# Patient Record
Sex: Male | Born: 1945 | Race: White | Hispanic: No | Marital: Married | Smoking: Never smoker
Health system: Southern US, Community
[De-identification: ages and names within clinical notes are randomized; demographics above are authoritative.]

## PROBLEM LIST (undated history)

## (undated) DIAGNOSIS — I513 Intracardiac thrombosis, not elsewhere classified: Secondary | ICD-10-CM

## (undated) DIAGNOSIS — Z950 Presence of cardiac pacemaker: Secondary | ICD-10-CM

## (undated) DIAGNOSIS — C801 Malignant (primary) neoplasm, unspecified: Secondary | ICD-10-CM

## (undated) DIAGNOSIS — H409 Unspecified glaucoma: Secondary | ICD-10-CM

## (undated) DIAGNOSIS — G459 Transient cerebral ischemic attack, unspecified: Secondary | ICD-10-CM

## (undated) DIAGNOSIS — F319 Bipolar disorder, unspecified: Secondary | ICD-10-CM

## (undated) DIAGNOSIS — C4A9 Merkel cell carcinoma, unspecified: Secondary | ICD-10-CM

## (undated) DIAGNOSIS — I10 Essential (primary) hypertension: Secondary | ICD-10-CM

## (undated) DIAGNOSIS — G4730 Sleep apnea, unspecified: Secondary | ICD-10-CM

## (undated) HISTORY — PX: SKIN CANCER EXCISION: SHX779

## (undated) HISTORY — PX: PROSTATE SURGERY: SHX751

## (undated) HISTORY — DX: Bipolar disorder, unspecified: F31.9

## (undated) HISTORY — PX: CHOLECYSTECTOMY: SHX55

## (undated) HISTORY — DX: Malignant (primary) neoplasm, unspecified: C80.1

## (undated) HISTORY — DX: Unspecified glaucoma: H40.9

## (undated) HISTORY — PX: PROSTATECTOMY: SHX69

## (undated) HISTORY — DX: Essential (primary) hypertension: I10

## (undated) HISTORY — PX: OTHER SURGICAL HISTORY: SHX169

## (undated) HISTORY — PX: LEG SURGERY: SHX1003

---

## 2004-05-04 ENCOUNTER — Emergency Department: Payer: Self-pay | Admitting: Emergency Medicine

## 2004-05-10 ENCOUNTER — Ambulatory Visit: Payer: Self-pay | Admitting: Unknown Physician Specialty

## 2004-07-11 ENCOUNTER — Ambulatory Visit: Payer: Self-pay | Admitting: Unknown Physician Specialty

## 2004-08-08 ENCOUNTER — Ambulatory Visit: Payer: Self-pay | Admitting: Internal Medicine

## 2004-10-28 ENCOUNTER — Ambulatory Visit: Payer: Self-pay | Admitting: Unknown Physician Specialty

## 2004-11-11 ENCOUNTER — Ambulatory Visit: Payer: Self-pay | Admitting: Dermatology

## 2004-11-16 ENCOUNTER — Ambulatory Visit: Payer: Self-pay | Admitting: Dermatology

## 2005-04-27 ENCOUNTER — Ambulatory Visit: Payer: Self-pay | Admitting: Unknown Physician Specialty

## 2005-08-24 ENCOUNTER — Ambulatory Visit: Payer: Self-pay | Admitting: Radiation Oncology

## 2005-08-29 ENCOUNTER — Ambulatory Visit: Payer: Self-pay | Admitting: Radiation Oncology

## 2005-09-18 ENCOUNTER — Ambulatory Visit: Payer: Self-pay | Admitting: Unknown Physician Specialty

## 2005-09-29 ENCOUNTER — Ambulatory Visit: Payer: Self-pay | Admitting: Radiation Oncology

## 2005-10-29 ENCOUNTER — Ambulatory Visit: Payer: Self-pay | Admitting: Radiation Oncology

## 2005-11-25 ENCOUNTER — Inpatient Hospital Stay: Payer: Self-pay | Admitting: Unknown Physician Specialty

## 2005-11-25 ENCOUNTER — Other Ambulatory Visit: Payer: Self-pay

## 2005-11-26 ENCOUNTER — Other Ambulatory Visit: Payer: Self-pay

## 2005-11-29 ENCOUNTER — Ambulatory Visit: Payer: Self-pay | Admitting: Radiation Oncology

## 2005-12-30 ENCOUNTER — Ambulatory Visit: Payer: Self-pay | Admitting: Radiation Oncology

## 2006-01-29 ENCOUNTER — Ambulatory Visit: Payer: Self-pay | Admitting: Radiation Oncology

## 2006-03-01 ENCOUNTER — Ambulatory Visit: Payer: Self-pay | Admitting: Radiation Oncology

## 2006-05-09 ENCOUNTER — Ambulatory Visit: Payer: Self-pay | Admitting: Oncology

## 2006-07-31 ENCOUNTER — Ambulatory Visit: Payer: Self-pay | Admitting: Unknown Physician Specialty

## 2006-09-12 ENCOUNTER — Ambulatory Visit: Payer: Self-pay | Admitting: Oncology

## 2006-09-30 ENCOUNTER — Ambulatory Visit: Payer: Self-pay | Admitting: Oncology

## 2006-10-01 ENCOUNTER — Ambulatory Visit: Payer: Self-pay | Admitting: Unknown Physician Specialty

## 2006-11-30 ENCOUNTER — Ambulatory Visit: Payer: Self-pay | Admitting: Oncology

## 2006-12-26 ENCOUNTER — Ambulatory Visit: Payer: Self-pay | Admitting: Oncology

## 2006-12-31 ENCOUNTER — Ambulatory Visit: Payer: Self-pay | Admitting: Oncology

## 2007-01-08 ENCOUNTER — Ambulatory Visit: Payer: Self-pay | Admitting: Unknown Physician Specialty

## 2007-01-30 ENCOUNTER — Ambulatory Visit: Payer: Self-pay | Admitting: Oncology

## 2007-03-02 ENCOUNTER — Ambulatory Visit: Payer: Self-pay | Admitting: Oncology

## 2007-03-06 ENCOUNTER — Ambulatory Visit: Payer: Self-pay | Admitting: Oncology

## 2007-03-13 ENCOUNTER — Ambulatory Visit: Payer: Self-pay | Admitting: Oncology

## 2007-04-01 ENCOUNTER — Ambulatory Visit: Payer: Self-pay | Admitting: Oncology

## 2007-06-02 ENCOUNTER — Ambulatory Visit: Payer: Self-pay | Admitting: Oncology

## 2007-06-11 ENCOUNTER — Ambulatory Visit: Payer: Self-pay | Admitting: Oncology

## 2007-06-30 ENCOUNTER — Ambulatory Visit: Payer: Self-pay | Admitting: Oncology

## 2007-08-30 ENCOUNTER — Ambulatory Visit: Payer: Self-pay | Admitting: Oncology

## 2007-09-17 ENCOUNTER — Ambulatory Visit: Payer: Self-pay | Admitting: Oncology

## 2007-09-24 ENCOUNTER — Ambulatory Visit: Payer: Self-pay | Admitting: Oncology

## 2007-09-26 ENCOUNTER — Ambulatory Visit: Payer: Self-pay | Admitting: Oncology

## 2007-09-30 ENCOUNTER — Ambulatory Visit: Payer: Self-pay | Admitting: Oncology

## 2007-11-06 ENCOUNTER — Other Ambulatory Visit: Payer: Self-pay

## 2007-11-06 ENCOUNTER — Emergency Department: Payer: Self-pay | Admitting: Emergency Medicine

## 2007-12-25 ENCOUNTER — Ambulatory Visit: Payer: Self-pay | Admitting: Oncology

## 2007-12-31 ENCOUNTER — Ambulatory Visit: Payer: Self-pay | Admitting: Oncology

## 2008-01-30 ENCOUNTER — Ambulatory Visit: Payer: Self-pay | Admitting: Dermatology

## 2008-02-28 ENCOUNTER — Ambulatory Visit: Payer: Self-pay | Admitting: Dermatology

## 2008-06-01 ENCOUNTER — Ambulatory Visit: Payer: Self-pay | Admitting: Oncology

## 2008-06-22 ENCOUNTER — Ambulatory Visit: Payer: Self-pay | Admitting: Oncology

## 2008-06-29 ENCOUNTER — Ambulatory Visit: Payer: Self-pay | Admitting: Oncology

## 2009-04-13 ENCOUNTER — Encounter: Admission: RE | Admit: 2009-04-13 | Discharge: 2009-04-13 | Payer: Self-pay | Admitting: Diagnostic Neuroimaging

## 2009-05-20 ENCOUNTER — Encounter: Admission: RE | Admit: 2009-05-20 | Discharge: 2009-05-20 | Payer: Self-pay | Admitting: Diagnostic Neuroimaging

## 2009-06-01 ENCOUNTER — Ambulatory Visit: Payer: Self-pay | Admitting: Oncology

## 2009-06-22 ENCOUNTER — Ambulatory Visit: Payer: Self-pay | Admitting: Oncology

## 2009-06-29 ENCOUNTER — Ambulatory Visit: Payer: Self-pay | Admitting: Oncology

## 2009-07-20 ENCOUNTER — Ambulatory Visit: Payer: Self-pay | Admitting: Oncology

## 2009-07-30 ENCOUNTER — Ambulatory Visit: Payer: Self-pay | Admitting: Oncology

## 2009-12-30 ENCOUNTER — Ambulatory Visit: Payer: Self-pay | Admitting: Oncology

## 2010-01-06 ENCOUNTER — Ambulatory Visit: Payer: Self-pay | Admitting: Oncology

## 2010-01-29 ENCOUNTER — Ambulatory Visit: Payer: Self-pay | Admitting: Oncology

## 2010-02-23 ENCOUNTER — Encounter: Payer: Self-pay | Admitting: Family Medicine

## 2010-03-01 ENCOUNTER — Encounter: Payer: Self-pay | Admitting: Family Medicine

## 2010-03-23 ENCOUNTER — Ambulatory Visit: Payer: Self-pay | Admitting: Oncology

## 2010-03-31 ENCOUNTER — Ambulatory Visit: Payer: Self-pay | Admitting: Oncology

## 2010-03-31 ENCOUNTER — Encounter: Payer: Self-pay | Admitting: Family Medicine

## 2010-05-01 ENCOUNTER — Encounter: Payer: Self-pay | Admitting: Family Medicine

## 2010-06-01 ENCOUNTER — Encounter: Payer: Self-pay | Admitting: Family Medicine

## 2010-07-05 ENCOUNTER — Ambulatory Visit: Payer: Self-pay | Admitting: Family Medicine

## 2010-07-05 ENCOUNTER — Ambulatory Visit: Payer: Self-pay | Admitting: Oncology

## 2010-07-07 ENCOUNTER — Ambulatory Visit: Payer: Self-pay | Admitting: Oncology

## 2010-07-31 ENCOUNTER — Ambulatory Visit: Payer: Self-pay | Admitting: Oncology

## 2011-01-09 ENCOUNTER — Ambulatory Visit: Payer: Self-pay | Admitting: Oncology

## 2011-01-30 ENCOUNTER — Ambulatory Visit: Payer: Self-pay | Admitting: Oncology

## 2011-08-04 ENCOUNTER — Ambulatory Visit: Payer: Self-pay | Admitting: Oncology

## 2011-08-04 LAB — CBC CANCER CENTER
Basophil #: 0 x10 3/mm (ref 0.0–0.1)
Eosinophil #: 0 x10 3/mm (ref 0.0–0.7)
Eosinophil %: 0.7 %
HGB: 14.3 g/dL (ref 13.0–18.0)
Lymphocyte #: 2.6 x10 3/mm (ref 1.0–3.6)
MCHC: 35.1 g/dL (ref 32.0–36.0)
Monocyte #: 0.6 x10 3/mm (ref 0.0–0.7)
Monocyte %: 9.2 %
RBC: 4.22 10*6/uL — ABNORMAL LOW (ref 4.40–5.90)

## 2011-08-30 ENCOUNTER — Ambulatory Visit: Payer: Self-pay | Admitting: Oncology

## 2012-08-02 ENCOUNTER — Ambulatory Visit: Payer: Self-pay | Admitting: Oncology

## 2012-08-05 LAB — CBC CANCER CENTER
Eosinophil #: 0.1 x10 3/mm (ref 0.0–0.7)
Eosinophil %: 1.2 %
HCT: 40.1 % (ref 40.0–52.0)
HGB: 14.3 g/dL (ref 13.0–18.0)
Lymphocyte %: 33.2 %
MCH: 32.7 pg (ref 26.0–34.0)
MCV: 92 fL (ref 80–100)
Monocyte %: 10.5 %
Neutrophil #: 3.2 x10 3/mm (ref 1.4–6.5)
Neutrophil %: 54.5 %
Platelet: 241 x10 3/mm (ref 150–440)
RDW: 12.9 % (ref 11.5–14.5)

## 2012-08-05 LAB — LACTATE DEHYDROGENASE: LDH: 197 U/L (ref 85–241)

## 2012-08-06 ENCOUNTER — Encounter: Payer: Self-pay | Admitting: General Surgery

## 2012-08-06 ENCOUNTER — Ambulatory Visit (INDEPENDENT_AMBULATORY_CARE_PROVIDER_SITE_OTHER): Payer: Medicare Other | Admitting: General Surgery

## 2012-08-06 VITALS — BP 118/68 | HR 72 | Resp 14 | Ht 72.0 in | Wt 194.0 lb

## 2012-08-06 DIAGNOSIS — L723 Sebaceous cyst: Secondary | ICD-10-CM

## 2012-08-06 DIAGNOSIS — Z87898 Personal history of other specified conditions: Secondary | ICD-10-CM

## 2012-08-06 DIAGNOSIS — C801 Malignant (primary) neoplasm, unspecified: Secondary | ICD-10-CM | POA: Insufficient documentation

## 2012-08-06 DIAGNOSIS — Z8572 Personal history of non-Hodgkin lymphomas: Secondary | ICD-10-CM | POA: Insufficient documentation

## 2012-08-06 DIAGNOSIS — L729 Follicular cyst of the skin and subcutaneous tissue, unspecified: Secondary | ICD-10-CM | POA: Insufficient documentation

## 2012-08-06 NOTE — Progress Notes (Signed)
Patient ID: Douglas Conrad., male   DOB: June 28, 1945, 67 y.o.   MRN: [id number]  No chief complaint on file.   HPI Douglas Schildt. is a 67 y.o. male who presents for right buttock lesion that was noticed back in December 2013. The patient states it's a little more prominent but is not having any pain at this time. 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   HPI  Past Medical History  Diagnosis Date  . Hypertension   . Glaucoma   . Cancer     merkel cell cancer  . Cancer     prostate    Past Surgical History  Procedure Laterality Date  . Prostate surgery    . Cholecystectomy      History reviewed. No pertinent family history.  Social History History  Substance Use Topics  . Smoking status: Never Smoker   . Smokeless tobacco: Not on file  . Alcohol Use: No    Allergies  Allergen Reactions  . Sulfa Antibiotics Hives    Current Outpatient Prescriptions  Medication Sig Dispense Refill  . carbamazepine (TEGRETOL) 200 MG tablet Take 600 mg by mouth 2 (two) times daily.      . clopidogrel (PLAVIX) 75 MG tablet Take 75 mg by mouth daily.      Marland Kitchen gabapentin (NEURONTIN) 300 MG capsule Take 300 mg by mouth 2 (two) times daily.      Marland Kitchen ibuprofen (ADVIL,MOTRIN) 400 MG tablet Take 400 mg by mouth daily.      Marland Kitchen latanoprost (XALATAN) 0.005 % ophthalmic solution Place 1 drop into both eyes at bedtime.      Marland Kitchen lisinopril (PRINIVIL,ZESTRIL) 40 MG tablet Take 40 mg by mouth 2 (two) times daily.      . metoprolol (LOPRESSOR) 50 MG tablet Take 50 mg by mouth 2 (two) times daily.      . Multiple Vitamin (MULTIVITAMIN) tablet Take 1 tablet by mouth daily.      . naproxen (NAPROSYN) 500 MG tablet Take 500 mg by mouth at bedtime as needed.      . Omega-3 Fatty Acids (FISH OIL) 1000 MG CAPS Take 2 capsules by mouth 2 (two) times daily.      . QUEtiapine (SEROQUEL) 300 MG tablet Take 600 mg by mouth at bedtime.      . simvastatin (ZOCOR) 80 MG tablet  Take 40 mg by mouth at bedtime.      . temazepam (RESTORIL) 15 MG capsule Take 15 mg by mouth at bedtime as needed for sleep.      Marland Kitchen timolol (BETIMOL) 0.5 % ophthalmic solution Place 1 drop into the right eye daily.      . Wound Dressings (AVO CREAM EX) Apply 1 application topically daily.       No current facility-administered medications for this visit.    Review of Systems Review of Systems  Constitutional: Negative.   Respiratory: Negative.   Cardiovascular: Negative.     Blood pressure 118/68, pulse 72, resp. rate 14, height 6' (1.829 m), weight 194 lb (87.998 kg).  Physical Exam Physical Exam  Constitutional: He appears well-developed and well-nourished.  Eyes: Conjunctivae are normal. No scleral icterus.  Neck: Trachea normal. No mass and no thyromegaly present.  Cardiovascular: Normal rate, regular rhythm, normal heart sounds and normal pulses.   Pulmonary/Chest: Effort normal and breath sounds normal.  Lymphadenopathy:    He has no cervical adenopathy.    He has no axillary  adenopathy.  Skin:  Skin thickening on right upper crease of buttocks. Possible skin cyst. Very suddle finding.      Data Reviewed  No imaging  Assessment    Likely the palpable finding is a cutaneous cyst.      Plan    Advised the pt to watch for any increase in size or associated pain and tenderness. To call if this happens.        SANKAR,SEEPLAPUTHUR G 08/07/2012, 6:06 AM

## 2012-08-06 NOTE — Patient Instructions (Addendum)
Patient to return in 3-4 months. Advised to leave the area alone at this time. If any drastic changes the patient to call our office.

## 2012-08-07 ENCOUNTER — Encounter: Payer: Self-pay | Admitting: General Surgery

## 2012-08-29 ENCOUNTER — Ambulatory Visit: Payer: Self-pay | Admitting: Oncology

## 2012-09-10 ENCOUNTER — Ambulatory Visit: Payer: Self-pay | Admitting: Oncology

## 2013-02-04 ENCOUNTER — Ambulatory Visit: Payer: Self-pay | Admitting: Oncology

## 2013-02-04 LAB — CBC CANCER CENTER
Basophil #: 0.1 x10 3/mm (ref 0.0–0.1)
Basophil %: 1.2 %
Eosinophil #: 0.1 x10 3/mm (ref 0.0–0.7)
Eosinophil %: 1.2 %
HCT: 41.2 % (ref 40.0–52.0)
HGB: 14.7 g/dL (ref 13.0–18.0)
Lymphocyte #: 2 x10 3/mm (ref 1.0–3.6)
Lymphocyte %: 36 %
MCH: 33.8 pg (ref 26.0–34.0)
MCHC: 35.7 g/dL (ref 32.0–36.0)
MCV: 95 fL (ref 80–100)
Monocyte #: 0.6 x10 3/mm (ref 0.2–1.0)
Monocyte %: 10.3 %
Neutrophil #: 2.9 x10 3/mm (ref 1.4–6.5)
Neutrophil %: 51.3 %
Platelet: 235 x10 3/mm (ref 150–440)
RBC: 4.35 10*6/uL — ABNORMAL LOW (ref 4.40–5.90)
RDW: 12.8 % (ref 11.5–14.5)
WBC: 5.6 x10 3/mm (ref 3.8–10.6)

## 2013-02-04 LAB — COMPREHENSIVE METABOLIC PANEL
Albumin: 4.1 g/dL (ref 3.4–5.0)
Alkaline Phosphatase: 79 U/L (ref 50–136)
BUN: 12 mg/dL (ref 7–18)
Calcium, Total: 8.5 mg/dL (ref 8.5–10.1)
Potassium: 5 mmol/L (ref 3.5–5.1)
SGOT(AST): 25 U/L (ref 15–37)
SGPT (ALT): 39 U/L (ref 12–78)

## 2013-02-04 LAB — LACTATE DEHYDROGENASE: LDH: 160 U/L (ref 85–241)

## 2013-02-05 LAB — PSA: PSA: <0.1 ng/mL (ref 0.0–4.0)

## 2013-03-01 ENCOUNTER — Ambulatory Visit: Payer: Self-pay | Admitting: Oncology

## 2013-03-11 ENCOUNTER — Ambulatory Visit: Payer: Self-pay | Admitting: Oncology

## 2013-08-04 ENCOUNTER — Ambulatory Visit: Payer: Self-pay | Admitting: Oncology

## 2013-08-05 LAB — COMPREHENSIVE METABOLIC PANEL
ALK PHOS: 68 U/L
ALT: 29 U/L (ref 12–78)
ANION GAP: 6 — AB (ref 7–16)
AST: 20 U/L (ref 15–37)
Albumin: 4 g/dL (ref 3.4–5.0)
BILIRUBIN TOTAL: 0.3 mg/dL (ref 0.2–1.0)
BUN: 10 mg/dL (ref 7–18)
CALCIUM: 9.2 mg/dL (ref 8.5–10.1)
CHLORIDE: 101 mmol/L (ref 98–107)
Co2: 34 mmol/L — ABNORMAL HIGH (ref 21–32)
Creatinine: 0.69 mg/dL (ref 0.60–1.30)
GLUCOSE: 96 mg/dL (ref 65–99)
Osmolality: 280 (ref 275–301)
POTASSIUM: 4.3 mmol/L (ref 3.5–5.1)
SODIUM: 141 mmol/L (ref 136–145)
Total Protein: 7.2 g/dL (ref 6.4–8.2)

## 2013-08-05 LAB — CBC CANCER CENTER
BASOS ABS: 0.1 x10 3/mm (ref 0.0–0.1)
BASOS PCT: 1 %
EOS PCT: 4 %
Eosinophil #: 0.2 x10 3/mm (ref 0.0–0.7)
HCT: 39.1 % — ABNORMAL LOW (ref 40.0–52.0)
HGB: 13.6 g/dL (ref 13.0–18.0)
Lymphocyte #: 2.3 x10 3/mm (ref 1.0–3.6)
Lymphocyte %: 36.6 %
MCH: 32.6 pg (ref 26.0–34.0)
MCHC: 34.7 g/dL (ref 32.0–36.0)
MCV: 94 fL (ref 80–100)
Monocyte #: 0.8 x10 3/mm (ref 0.2–1.0)
Monocyte %: 12.6 %
Neutrophil #: 2.8 x10 3/mm (ref 1.4–6.5)
Neutrophil %: 45.8 %
PLATELETS: 254 x10 3/mm (ref 150–440)
RBC: 4.17 10*6/uL — ABNORMAL LOW (ref 4.40–5.90)
RDW: 13.1 % (ref 11.5–14.5)
WBC: 6.2 x10 3/mm (ref 3.8–10.6)

## 2013-08-29 ENCOUNTER — Ambulatory Visit: Payer: Self-pay | Admitting: Oncology

## 2014-08-17 ENCOUNTER — Ambulatory Visit
Admit: 2014-08-17 | Disposition: A | Discharge status: Home / Self Care | Payer: Self-pay | Attending: Oncology | Admitting: Oncology

## 2014-08-17 LAB — COMPREHENSIVE METABOLIC PANEL
ALBUMIN: 4.2 g/dL
ALK PHOS: 54 U/L
ANION GAP: 5 — AB (ref 7–16)
BUN: 13 mg/dL
Bilirubin,Total: 0.6 mg/dL
CALCIUM: 8.5 mg/dL — AB
CHLORIDE: 94 mmol/L — AB
CREATININE: 0.59 mg/dL — AB
Co2: 32 mmol/L
EGFR (African American): >60
GLUCOSE: 103 mg/dL — AB
Potassium: 4.3 mmol/L
SGOT(AST): 25 U/L
SGPT (ALT): 28 U/L
SODIUM: 131 mmol/L — AB
TOTAL PROTEIN: 6.8 g/dL

## 2014-08-17 LAB — CBC CANCER CENTER
BASOS ABS: 0 x10 3/mm (ref 0.0–0.1)
Basophil %: 0.5 %
Eosinophil #: 0.1 x10 3/mm (ref 0.0–0.7)
Eosinophil %: 0.9 %
HCT: 39.8 % — AB (ref 40.0–52.0)
HGB: 14.1 g/dL (ref 13.0–18.0)
LYMPHS ABS: 2.3 x10 3/mm (ref 1.0–3.6)
Lymphocyte %: 37.4 %
MCH: 33.1 pg (ref 26.0–34.0)
MCHC: 35.5 g/dL (ref 32.0–36.0)
MCV: 93 fL (ref 80–100)
MONO ABS: 0.6 x10 3/mm (ref 0.2–1.0)
Monocyte %: 10.5 %
NEUTROS ABS: 3.1 x10 3/mm (ref 1.4–6.5)
Neutrophil %: 50.7 %
PLATELETS: 246 x10 3/mm (ref 150–440)
RBC: 4.27 10*6/uL — ABNORMAL LOW (ref 4.40–5.90)
RDW: 12.9 % (ref 11.5–14.5)
WBC: 6.1 x10 3/mm (ref 3.8–10.6)

## 2014-08-17 LAB — LACTATE DEHYDROGENASE: LDH: 130 U/L

## 2014-09-16 DIAGNOSIS — R079 Chest pain, unspecified: Secondary | ICD-10-CM | POA: Insufficient documentation

## 2014-09-16 DIAGNOSIS — I10 Essential (primary) hypertension: Secondary | ICD-10-CM | POA: Insufficient documentation

## 2014-09-16 DIAGNOSIS — G453 Amaurosis fugax: Secondary | ICD-10-CM | POA: Insufficient documentation

## 2014-10-14 DIAGNOSIS — E782 Mixed hyperlipidemia: Secondary | ICD-10-CM | POA: Insufficient documentation

## 2014-11-29 ENCOUNTER — Emergency Department: Payer: Medicare Other

## 2014-11-29 ENCOUNTER — Emergency Department
Admission: EM | Admit: 2014-11-29 | Discharge: 2014-11-29 | Disposition: A | Discharge status: Home / Self Care | Payer: Medicare Other | Attending: Emergency Medicine | Admitting: Emergency Medicine

## 2014-11-29 ENCOUNTER — Encounter: Payer: Self-pay | Admitting: Emergency Medicine

## 2014-11-29 ENCOUNTER — Other Ambulatory Visit: Payer: Self-pay

## 2014-11-29 DIAGNOSIS — R55 Syncope and collapse: Secondary | ICD-10-CM | POA: Diagnosis not present

## 2014-11-29 DIAGNOSIS — Y998 Other external cause status: Secondary | ICD-10-CM | POA: Diagnosis not present

## 2014-11-29 DIAGNOSIS — Y92002 Bathroom of unspecified non-institutional (private) residence single-family (private) house as the place of occurrence of the external cause: Secondary | ICD-10-CM | POA: Diagnosis not present

## 2014-11-29 DIAGNOSIS — Z9119 Patient's noncompliance with other medical treatment and regimen: Secondary | ICD-10-CM | POA: Insufficient documentation

## 2014-11-29 DIAGNOSIS — S9032XA Contusion of left foot, initial encounter: Secondary | ICD-10-CM | POA: Diagnosis not present

## 2014-11-29 DIAGNOSIS — Y9389 Activity, other specified: Secondary | ICD-10-CM | POA: Insufficient documentation

## 2014-11-29 DIAGNOSIS — Z79899 Other long term (current) drug therapy: Secondary | ICD-10-CM | POA: Insufficient documentation

## 2014-11-29 DIAGNOSIS — Z8673 Personal history of transient ischemic attack (TIA), and cerebral infarction without residual deficits: Secondary | ICD-10-CM | POA: Diagnosis not present

## 2014-11-29 DIAGNOSIS — Z7902 Long term (current) use of antithrombotics/antiplatelets: Secondary | ICD-10-CM | POA: Diagnosis not present

## 2014-11-29 DIAGNOSIS — W228XXA Striking against or struck by other objects, initial encounter: Secondary | ICD-10-CM | POA: Diagnosis not present

## 2014-11-29 DIAGNOSIS — I10 Essential (primary) hypertension: Secondary | ICD-10-CM | POA: Diagnosis not present

## 2014-11-29 DIAGNOSIS — Z791 Long term (current) use of non-steroidal anti-inflammatories (NSAID): Secondary | ICD-10-CM | POA: Diagnosis not present

## 2014-11-29 HISTORY — DX: Transient cerebral ischemic attack, unspecified: G45.9

## 2014-11-29 LAB — CBC WITH DIFFERENTIAL/PLATELET
Basophils Absolute: 0 10*3/uL (ref 0–0.1)
Basophils Relative: 1 %
EOS ABS: 0.1 10*3/uL (ref 0–0.7)
Eosinophils Relative: 1 %
HEMATOCRIT: 39.2 % — AB (ref 40.0–52.0)
Hemoglobin: 13.9 g/dL (ref 13.0–18.0)
LYMPHS ABS: 2 10*3/uL (ref 1.0–3.6)
Lymphocytes Relative: 31 %
MCH: 33.3 pg (ref 26.0–34.0)
MCHC: 35.6 g/dL (ref 32.0–36.0)
MCV: 93.6 fL (ref 80.0–100.0)
Monocytes Absolute: 0.9 10*3/uL (ref 0.2–1.0)
Monocytes Relative: 13 %
NEUTROS ABS: 3.6 10*3/uL (ref 1.4–6.5)
NEUTROS PCT: 54 %
PLATELETS: 232 10*3/uL (ref 150–440)
RBC: 4.18 MIL/uL — ABNORMAL LOW (ref 4.40–5.90)
RDW: 13.1 % (ref 11.5–14.5)
WBC: 6.6 10*3/uL (ref 3.8–10.6)

## 2014-11-29 LAB — BASIC METABOLIC PANEL
Anion gap: 8 (ref 5–15)
BUN: 11 mg/dL (ref 6–20)
CO2: 29 mmol/L (ref 22–32)
Calcium: 9.1 mg/dL (ref 8.9–10.3)
Chloride: 97 mmol/L — ABNORMAL LOW (ref 101–111)
Creatinine, Ser: 0.47 mg/dL — ABNORMAL LOW (ref 0.61–1.24)
GFR calc Af Amer: >60 mL/min (ref >60)
Glucose, Bld: 117 mg/dL — ABNORMAL HIGH (ref 65–99)
POTASSIUM: 3.9 mmol/L (ref 3.5–5.1)
Sodium: 134 mmol/L — ABNORMAL LOW (ref 135–145)

## 2014-11-29 LAB — TROPONIN I: Troponin I: <0.03 ng/mL (ref <0.031)

## 2014-11-29 NOTE — ED Notes (Signed)
Pt presents to  The ER from home with complaints of passing out and falling in the shower. Pt reports he recalls he was drying and felt light headed  and when he woke up he was in the shower floor. Pt reports both feet feel sore but left feet is more swollen and cannot bear any weight.

## 2014-11-29 NOTE — Discharge Instructions (Signed)
Contusion °A contusion is a deep bruise. Contusions happen when an injury causes bleeding under the skin. Signs of bruising include pain, puffiness (swelling), and discolored skin. The contusion may turn blue, purple, or yellow. °HOME CARE  °· Put ice on the injured area. °¨ Put ice in a plastic bag. °¨ Place a towel between your skin and the bag. °¨ Leave the ice on for 15-20 minutes, 03-04 times a day. °· Only take medicine as told by your doctor. °· Rest the injured area. °· If possible, raise (elevate) the injured area to lessen puffiness. °GET HELP RIGHT AWAY IF:  °· You have more bruising or puffiness. °· You have pain that is getting worse. °· Your puffiness or pain is not helped by medicine. °MAKE SURE YOU:  °· Understand these instructions. °· Will watch your condition. °· Will get help right away if you are not doing well or get worse. °Document Released: 10/04/2007 Document Revised: 07/10/2011 Document Reviewed: 02/20/2011 °ExitCare® Patient Information ©2015 [employer]. This information is not intended to replace advice given to you by your health care provider. Make sure you discuss any questions you have with your health care provider. ° °

## 2014-11-29 NOTE — ED Provider Notes (Signed)
[facility] Emergency Department Provider Note  ____________________________________________  Time seen: Approximately 410 PM  I have reviewed the triage vital signs and the nursing notes.   HISTORY  Chief Complaint Loss of Consciousness and Fall    HPI Douglas Perkins. is a 69 y.o. male with a history of TIA and intermittent dizziness who presents today one day after a syncopal episode in his bathtub. He said that he had finished taking a shower when he became dizzy lightheaded and "passed out." He did not have any chest pain or racing heart. He thinks he was only out for several seconds. He said that he came to at the bottom of his tub with his feet punched near the drain. He says that he had a similar episode 2011 when he was diagnosed with a TIA. He has dizziness episodes several times a month but usually does not lose consciousness.He has been worked up by Dr. Manuella Ghazi here at the [facility] clinic, who is his neurologist. The patient came in today because he has been having worsening left foot pain and swelling since yesterday. He is compliant with his Plavix. Says that he was using crutches as well as a rolling walker at home for mobility.   Past Medical History  Diagnosis Date  . Hypertension   . Glaucoma   . Cancer     merkel cell cancer  . Cancer     prostate  . TIA (transient ischemic attack)     Patient Active Problem List   Diagnosis Date Noted  . Skin cyst 08/06/2012  . Personal history of lymphoma 08/06/2012  . Cancer     Past Surgical History  Procedure Laterality Date  . Prostate surgery    . Cholecystectomy    . Prostatectomy      Current Outpatient Rx  Name  Route  Sig  Dispense  Refill  . carbamazepine (TEGRETOL) 200 MG tablet   Oral   Take 600 mg by mouth 2 (two) times daily.         . clopidogrel (PLAVIX) 75 MG tablet   Oral   Take 75 mg by mouth daily.         Marland Kitchen gabapentin (NEURONTIN) 300 MG capsule   Oral   Take  300 mg by mouth 2 (two) times daily.         Marland Kitchen ibuprofen (ADVIL,MOTRIN) 400 MG tablet   Oral   Take 400 mg by mouth daily.         Marland Kitchen latanoprost (XALATAN) 0.005 % ophthalmic solution   Both Eyes   Place 1 drop into both eyes at bedtime.         Marland Kitchen lisinopril (PRINIVIL,ZESTRIL) 40 MG tablet   Oral   Take 40 mg by mouth 2 (two) times daily.         . metoprolol (LOPRESSOR) 50 MG tablet   Oral   Take 50 mg by mouth 2 (two) times daily.         . Multiple Vitamin (MULTIVITAMIN) tablet   Oral   Take 1 tablet by mouth daily.         . naproxen (NAPROSYN) 500 MG tablet   Oral   Take 500 mg by mouth at bedtime as needed.         . Omega-3 Fatty Acids (FISH OIL) 1000 MG CAPS   Oral   Take 2 capsules by mouth 2 (two) times daily.         Marland Kitchen  QUEtiapine (SEROQUEL) 300 MG tablet   Oral   Take 600 mg by mouth at bedtime.         . simvastatin (ZOCOR) 80 MG tablet   Oral   Take 40 mg by mouth at bedtime.         . temazepam (RESTORIL) 15 MG capsule   Oral   Take 15 mg by mouth at bedtime as needed for sleep.         Marland Kitchen timolol (BETIMOL) 0.5 % ophthalmic solution   Right Eye   Place 1 drop into the right eye daily.         . Wound Dressings (AVO CREAM EX)   Apply externally   Apply 1 application topically daily.           Allergies Feldene and Sulfa antibiotics  History reviewed. No pertinent family history.  Social History History  Substance Use Topics  . Smoking status: Never Smoker   . Smokeless tobacco: Not on file  . Alcohol Use: No    Review of Systems Constitutional: No fever/chills Eyes: No visual changes. ENT: No sore throat. Cardiovascular: Denies chest pain. Respiratory: Denies shortness of breath. Gastrointestinal: No abdominal pain.  No nausea, no vomiting.  No diarrhea.  No constipation. Genitourinary: Negative for dysuria. Musculoskeletal: Negative for back pain. Skin: Negative for rash. Neurological: Negative for  headaches, focal weakness or numbness. 10-point ROS otherwise negative.  ____________________________________________   PHYSICAL EXAM:  VITAL SIGNS: ED Triage Vitals  Enc Vitals Group     BP 11/29/14 1343 151/63 mmHg     Pulse Rate 11/29/14 1343 57     Resp 11/29/14 1343 20     Temp 11/29/14 1343 98.3 F (36.8 C)     Temp Source 11/29/14 1343 Oral     SpO2 11/29/14 1343 99 %     Weight 11/29/14 1343 190 lb (86.183 kg)     Height 11/29/14 1343 6' (1.829 m)     Head Cir --      Peak Flow --      Pain Score 11/29/14 1346 2     Pain Loc --      Pain Edu? --      Excl. in [place]? --     Constitutional: Alert and oriented. Well appearing and in no acute distress. Eyes: Conjunctivae are normal. PERRL. EOMI. Head: Atraumatic. Nose: No congestion/rhinnorhea. Mouth/Throat: Mucous membranes are moist.  Oropharynx non-erythematous. Neck: No stridor.   Cardiovascular: Normal rate, regular rhythm. Grossly normal heart sounds.  Good peripheral circulation. Respiratory: Normal respiratory effort.  No retractions. Lungs CTAB. Gastrointestinal: Soft and nontender. No distention. No abdominal bruits. No CVA tenderness. Musculoskeletal: Left foot which is diffusely swollen with ecchymosis over the dorsum. There is tenderness to palpation without any crepitus. There are dorsalis pedis pulses present and equal to bilateral feet. Patient has brisk capillary refill to the nailbeds. Also full range of motion of the toes and ankle..  No joint effusions. Neurologic:  Normal speech and language. No gross focal neurologic deficits are appreciated. No gait instability. Skin:  Skin is warm, dry and intact. No rash noted. Psychiatric: Mood and affect are normal. Speech and behavior are normal.  ____________________________________________   LABS (all labs ordered are listed, but only abnormal results are displayed)  Labs Reviewed  CBC WITH DIFFERENTIAL/PLATELET - Abnormal; Notable for the following:     RBC 4.18 (*)    HCT 39.2 (*)    All other components within normal limits  BASIC METABOLIC PANEL - Abnormal; Notable for the following:    Sodium 134 (*)    Chloride 97 (*)    Glucose, Bld 117 (*)    Creatinine, Ser 0.47 (*)    All other components within normal limits  TROPONIN I   ____________________________________________  EKG  ED ECG REPORT I, Doran Stabler, the attending physician, personally viewed and interpreted this ECG.   Date: 11/29/2014  EKG Time: 1655  Rate: 53  Rhythm: sinus bradycardia  Axis: Left axis deviation  Intervals:right bundle branch block  ST&T Change: T-wave inversion in aVL. No ST elevations or depressions.  Unchanged EKG from 11/06/2007.  ____________________________________________  RADIOLOGY  No acute findings on x-rays of the bilateral feet. No acute findings on the CT of the head and C-spine. ____________________________________________   PROCEDURES    ____________________________________________   INITIAL IMPRESSION / ASSESSMENT AND PLAN / ED COURSE  Pertinent labs & imaging results that were available during my care of the patient were reviewed by me and considered in my medical decision making (see chart for details).  ----------------------------------------- 6:19 PM on 11/29/2014 -----------------------------------------  Reassuring lab work as well as EKG. EKG was unchanged from previous. Patient continues to rest and is at baseline mental status. We'll discharge to home with Ace wrap to the left foot. Likely contusion to left foot with swelling which is worse secondary to Plavix use. Furthermore, patient without any new findings on his CAT scan of the brain. Already on Plavix. Feel that this is good anticoagulation coverage if he is having repeat TIAs causing his symptoms. He'll follow-up with his neurologist, Dr. Melrose Nakayama as an outpatient. The patient says he has been extensively worked up for similar episodes in the  past by neurology as well as cardiology as well as a recent normal stress test. ____________________________________________   FINAL CLINICAL IMPRESSION(S) / ED DIAGNOSES  Acute syncope. Acute left-sided foot contusion. Initial visit.    Orbie Pyo, MD 11/29/14 [phone]

## 2015-02-08 DIAGNOSIS — R001 Bradycardia, unspecified: Secondary | ICD-10-CM | POA: Insufficient documentation

## 2015-05-02 DIAGNOSIS — Z950 Presence of cardiac pacemaker: Secondary | ICD-10-CM

## 2015-05-02 HISTORY — DX: Presence of cardiac pacemaker: Z95.0

## 2015-05-21 ENCOUNTER — Ambulatory Visit: Payer: Self-pay | Admitting: Obstetrics and Gynecology

## 2015-05-21 ENCOUNTER — Encounter: Payer: Self-pay | Admitting: Emergency Medicine

## 2015-05-21 ENCOUNTER — Emergency Department
Admission: EM | Admit: 2015-05-21 | Discharge: 2015-05-21 | Disposition: A | Discharge status: Home / Self Care | Payer: Medicare Other | Attending: Emergency Medicine | Admitting: Emergency Medicine

## 2015-05-21 ENCOUNTER — Encounter: Payer: Self-pay | Admitting: *Deleted

## 2015-05-21 ENCOUNTER — Emergency Department: Payer: Medicare Other

## 2015-05-21 DIAGNOSIS — M545 Low back pain, unspecified: Secondary | ICD-10-CM

## 2015-05-21 DIAGNOSIS — E871 Hypo-osmolality and hyponatremia: Secondary | ICD-10-CM | POA: Insufficient documentation

## 2015-05-21 DIAGNOSIS — R109 Unspecified abdominal pain: Secondary | ICD-10-CM | POA: Diagnosis not present

## 2015-05-21 DIAGNOSIS — I10 Essential (primary) hypertension: Secondary | ICD-10-CM | POA: Diagnosis not present

## 2015-05-21 DIAGNOSIS — Z79899 Other long term (current) drug therapy: Secondary | ICD-10-CM | POA: Insufficient documentation

## 2015-05-21 LAB — URINALYSIS COMPLETE WITH MICROSCOPIC (ARMC ONLY)
Bilirubin Urine: NEGATIVE
GLUCOSE, UA: NEGATIVE mg/dL
Hgb urine dipstick: NEGATIVE
Ketones, ur: NEGATIVE mg/dL
Leukocytes, UA: NEGATIVE
NITRITE: NEGATIVE
Protein, ur: NEGATIVE mg/dL
SPECIFIC GRAVITY, URINE: 1.006 (ref 1.005–1.030)
Squamous Epithelial / LPF: NONE SEEN
WBC, UA: NONE SEEN WBC/hpf (ref 0–5)
pH: 7 (ref 5.0–8.0)

## 2015-05-21 LAB — BASIC METABOLIC PANEL
ANION GAP: 5 (ref 5–15)
BUN: 7 mg/dL (ref 6–20)
CALCIUM: 8.7 mg/dL — AB (ref 8.9–10.3)
CO2: 27 mmol/L (ref 22–32)
Chloride: 94 mmol/L — ABNORMAL LOW (ref 101–111)
Creatinine, Ser: 0.48 mg/dL — ABNORMAL LOW (ref 0.61–1.24)
GFR calc Af Amer: >60 mL/min (ref >60)
GFR calc non Af Amer: >60 mL/min (ref >60)
GLUCOSE: 92 mg/dL (ref 65–99)
Potassium: 4.7 mmol/L (ref 3.5–5.1)
Sodium: 126 mmol/L — ABNORMAL LOW (ref 135–145)

## 2015-05-21 LAB — CBC
HCT: 27 % — ABNORMAL LOW (ref 40.0–52.0)
Hemoglobin: 9.3 g/dL — ABNORMAL LOW (ref 13.0–18.0)
MCH: 30.3 pg (ref 26.0–34.0)
MCHC: 34.5 g/dL (ref 32.0–36.0)
MCV: 87.9 fL (ref 80.0–100.0)
PLATELETS: 320 10*3/uL (ref 150–440)
RBC: 3.07 MIL/uL — ABNORMAL LOW (ref 4.40–5.90)
RDW: 15 % — ABNORMAL HIGH (ref 11.5–14.5)
WBC: 4.4 10*3/uL (ref 3.8–10.6)

## 2015-05-21 LAB — TROPONIN I: Troponin I: <0.03 ng/mL (ref <0.031)

## 2015-05-21 MED ORDER — OXYCODONE-ACETAMINOPHEN 5-325 MG PO TABS
1.0000 | ORAL_TABLET | Freq: Once | ORAL | Status: AC
  Administered 2015-05-21: 1 via ORAL
  Filled 2015-05-21: qty 1

## 2015-05-21 MED ORDER — OXYCODONE-ACETAMINOPHEN 5-325 MG PO TABS: 1.0000 | ORAL_TABLET | ORAL | Status: DC | PRN

## 2015-05-21 MED ORDER — SODIUM CHLORIDE 0.9 % IV BOLUS (SEPSIS)
500.0000 mL | Freq: Once | INTRAVENOUS | Status: AC
  Administered 2015-05-21: 500 mL via INTRAVENOUS

## 2015-05-21 MED ORDER — IOHEXOL 350 MG/ML SOLN
100.0000 mL | Freq: Once | INTRAVENOUS | Status: AC | PRN
  Administered 2015-05-21: 125 mL via INTRAVENOUS

## 2015-05-21 MED ORDER — ACETAMINOPHEN 500 MG PO TABS: 1000.0000 mg | ORAL_TABLET | ORAL | Status: DC

## 2015-05-21 NOTE — Discharge Instructions (Signed)
Please follow up closely with your primary care doctor for both your back pain and low sodium (this needs to be rechecked early next week). Return to the emergency room right away if you experience fevers, chills, severe increase in pain, numbness or weakness in the legs, difficulty walking, blood in the stool, have difficulty with urination or unable to urinate, have any incontinence or other new concerns arise.  *You will need an outpatient MRI abdomen to evaluate liver lesions seen incidentally on CT scan in ER. Outpatient physician to order this scan for you.   Hyponatremia Hyponatremia is when the amount of salt (sodium) in your blood is too low. When sodium levels are low, your cells absorb extra water and they swell. The swelling happens throughout the body, but it mostly affects the brain. CAUSES This condition may be caused by:  Heart, kidney, or liver problems.  Thyroid problems.  Adrenal gland problems.  Metabolic conditions, such as syndrome of inappropriate antidiuretic hormone (SIADH).  Severe vomiting and diarrhea.  Certain medicines or illegal drugs.  Dehydration.  Drinking too much water.  Eating a diet that is low in sodium.  Large burns on your body.  Sweating. RISK FACTORS This condition is more likely to develop in people who:  Have long-term (chronic) kidney disease.  Have heart failure.  Have a medical condition that causes frequent or excessive diarrhea.  Have metabolic conditions, such as Addison disease or SIADH.  Take certain medicines that affect the sodium and fluid balance in the blood. Some of these medicine types include:  Diuretics.  NSAIDs.  Some opioid pain medicines.  Some antidepressants.  Some seizure prevention medicines. SYMPTOMS  Symptoms of this condition include:  Nausea and vomiting.  Confusion.  Lethargy.  Agitation.  Headache.  Seizures.  Unconsciousness.  Appetite loss.  Muscle weakness and  cramping.  Feeling weak or light-headed.  Having a rapid heart rate.  Fainting, in severe cases. DIAGNOSIS This condition is diagnosed with a medical history and physical exam. You will also have other tests, including:  Blood tests.  Urine tests. TREATMENT Treatment for this condition depends on the cause. Treatment may include:  Fluids given through an IV tube that is inserted into one of your veins.  Medicines to correct the sodium imbalance. If medicines are causing the condition, the medicines will need to be adjusted.  Limiting water or fluid intake to get the correct sodium balance. HOME CARE INSTRUCTIONS  Take medicines only as directed by your health care provider. Many medicines can make this condition worse. Talk with your health care provider about any medicines that you are currently taking.  Carefully follow a recommended diet as directed by your health care provider.  Carefully follow instructions from your health care provider about fluid restrictions.  Keep all follow-up visits as directed by your health care provider. This is important.  Do not drink alcohol. SEEK MEDICAL CARE IF:  You develop worsening nausea, fatigue, headache, confusion, or weakness.  Your symptoms go away and then return.  You have problems following the recommended diet. SEEK IMMEDIATE MEDICAL CARE IF:  You have a seizure.  You faint.  You have ongoing diarrhea or vomiting.   This information is not intended to replace advice given to you by your health care provider. Make sure you discuss any questions you have with your health care provider.   Document Released: 04/07/2002 Document Revised: 09/01/2014 Document Reviewed: 05/07/2014 Elsevier Interactive Patient Education 2016 Elsevier Inc.  Back Pain, Adult Back  pain is very common in adults.The cause of back pain is rarely dangerous and the pain often gets better over time.The cause of your back pain may not be known.  Some common causes of back pain include:  Strain of the muscles or ligaments supporting the spine.  Wear and tear (degeneration) of the spinal disks.  Arthritis.  Direct injury to the back. For many people, back pain may return. Since back pain is rarely dangerous, most people can learn to manage this condition on their own. HOME CARE INSTRUCTIONS Watch your back pain for any changes. The following actions may help to lessen any discomfort you are feeling:  Remain active. It is stressful on your back to sit or stand in one place for long periods of time. Do not sit, drive, or stand in one place for more than 30 minutes at a time. Take short walks on even surfaces as soon as you are able.Try to increase the length of time you walk each day.  Exercise regularly as directed by your health care provider. Exercise helps your back heal faster. It also helps avoid future injury by keeping your muscles strong and flexible.  Do not stay in bed.Resting more than 1-2 days can delay your recovery.  Pay attention to your body when you bend and lift. The most comfortable positions are those that put less stress on your recovering back. Always use proper lifting techniques, including:  Bending your knees.  Keeping the load close to your body.  Avoiding twisting.  Find a comfortable position to sleep. Use a firm mattress and lie on your side with your knees slightly bent. If you lie on your back, put a pillow under your knees.  Avoid feeling anxious or stressed.Stress increases muscle tension and can worsen back pain.It is important to recognize when you are anxious or stressed and learn ways to manage it, such as with exercise.  Take medicines only as directed by your health care provider. Over-the-counter medicines to reduce pain and inflammation are often the most helpful.Your health care provider may prescribe muscle relaxant drugs.These medicines help dull your pain so you can more quickly  return to your normal activities and healthy exercise.  Apply ice to the injured area:  Put ice in a plastic bag.  Place a towel between your skin and the bag.  Leave the ice on for 20 minutes, 2-3 times a day for the first 2-3 days. After that, ice and heat may be alternated to reduce pain and spasms.  Maintain a healthy weight. Excess weight puts extra stress on your back and makes it difficult to maintain good posture. SEEK MEDICAL CARE IF:  You have pain that is not relieved with rest or medicine.  You have increasing pain going down into the legs or buttocks.  You have pain that does not improve in one week.  You have night pain.  You lose weight.  You have a fever or chills. SEEK IMMEDIATE MEDICAL CARE IF:   You develop new bowel or bladder control problems.  You have unusual weakness or numbness in your arms or legs.  You develop nausea or vomiting.  You develop abdominal pain.  You feel faint.   This information is not intended to replace advice given to you by your health care provider. Make sure you discuss any questions you have with your health care provider.   Document Released: 04/17/2005 Document Revised: 05/08/2014 Document Reviewed: 08/19/2013 Elsevier Interactive Patient Education [employer].

## 2015-05-21 NOTE — ED Notes (Signed)
Patient transported to CT 

## 2015-05-21 NOTE — ED Provider Notes (Signed)
[facility]  I accepted care from Dr. Jacqualine Code ____________________________________________      [facility] were viewed by me. Imaging interpreted by radiologist.  CT a abdomen: IMPRESSION: No evidence of aortic aneurysm or acute arterial abnormality.  There are 2 hypodensities at the dome of the liver. One of them is new or larger than on prior studies. If the patient has a history of malignancy, consider MRI to further delineate  L4-5 disc herniation is suspected. MRI may be helpful to further characterize.  Hiatal hernia. Ventral abdominal hernia. A right inguinal hernia.  ____________________________________________   PROCEDURES  Procedure(s) performed: None  Critical Care performed: None  ____________________________________________   INITIAL IMPRESSION / ASSESSMENT AND PLAN / ED COURSE   Pertinent labs & imaging results that were available during my care of the patient were reviewed by me and considered in my medical decision making (see chart for details).  Patient is overall well-appearing. No certain cause found for his flank pain, but examine evaluation are reassuring. Patient has had chronic hyponatremia, he was given IV fluid bolus here in the emergency department. He is essentially asymptomatic. He can follow-up with his primary care physician for this.  I discussed the incidental findings on the CT including liver lesions and recommendation of MRI as an outpatient for further evaluation.  CONSULTATIONS: None    Patient / Family / Caregiver informed of clinical course, medical decision-making process, and agree with plan.   I discussed return precautions, follow-up instructions, and discharged instructions with patient and/or family.     ____________________________________________   FINAL CLINICAL IMPRESSION(S) / ED DIAGNOSES  Final diagnoses:  Hyponatremia  Right-sided low back pain without sciatica         Lisa Roca, MD 05/21/15 1914

## 2015-05-21 NOTE — ED Notes (Signed)
Brought over from [facility] with lower back pain for the past several days ..states his pain is mainly at bilateral flank areas   denies any urinary sx's   Hs of MS

## 2015-05-21 NOTE — ED Provider Notes (Signed)
[facility] Emergency Department Provider Note  ____________________________________________  Time seen: Approximately 4:38 PM  I have reviewed the triage vital signs and the nursing notes.   HISTORY  Chief Complaint Flank Pain    HPI Douglas Perkins. is a 70 y.o. male history of recent bleeding gastric ulcer secondary to NSAID use, prostatectomy. The patient reports that he's been having lower back pain for the last 3 days.  Reports an achy pain along the right lower back. Not associated with any numbness tingling weakness in the legs. Describes it as a fairly sharp pain in the lower back around "the kidneys" on both sides. No fevers or chills. He did have significant bleeding ulcer, which was fixed in [place]. He does not have any further bleeding in his stool, he shows me blood counts from the last 3 checks that shows slight steady improvement in his hemoglobin to 9.2 on last check.  He reports no injury or fall. Pain has been persistent for about last 3 days. Not associated with any cool medicine his legs. No chest pain or trouble breathing.  Patient reports feeling like a sore muscle.  Past Medical History  Diagnosis Date  . Hypertension   . Glaucoma   . Cancer (HCC)     merkel cell cancer  . Cancer [facility])     prostate  . TIA (transient ischemic attack)     Patient Active Problem List   Diagnosis Date Noted  . Bradycardia 02/08/2015  . 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 AF (amaurosis fugax) 09/16/2014  . Benign essential HTN 09/16/2014  . Chest pain 09/16/2014  . Skin cyst 08/06/2012  . Personal history of lymphoma 08/06/2012  . Cancer [facility])     Past Surgical History  Procedure Laterality Date  . Prostate surgery    . Cholecystectomy    . Prostatectomy      Current Outpatient Rx  Name  Route  Sig  Dispense  Refill  . amLODipine (NORVASC) 5 MG tablet   Oral   Take 5 mg by mouth daily.          . carbamazepine (TEGRETOL) 200 MG tablet   Oral   Take 600 mg by mouth 2 (two) times daily.         Marland Kitchen esomeprazole (NEXIUM) 40 MG capsule   Oral   Take 40 mg by mouth 2 (two) times daily.         Marland Kitchen gabapentin (NEURONTIN) 300 MG capsule   Oral   Take 300 mg by mouth 2 (two) times daily.         Marland Kitchen glucosamine-chondroitin 500-400 MG tablet   Oral   Take 1 tablet by mouth daily.         Marland Kitchen latanoprost (XALATAN) 0.005 % ophthalmic solution   Both Eyes   Place 1 drop into both eyes at bedtime.         Marland Kitchen lisinopril (PRINIVIL,ZESTRIL) 40 MG tablet   Oral   Take 40 mg by mouth daily.          . metoprolol (LOPRESSOR) 50 MG tablet   Oral   Take 50 mg by mouth 2 (two) times daily.         . Multiple Vitamin (MULTIVITAMIN WITH MINERALS) TABS tablet   Oral   Take 1 tablet by mouth daily.         . Omega-3 Fatty Acids (FISH OIL) 1000 MG CAPS   Oral  Take 1,000 mg by mouth 2 (two) times daily.          . QUEtiapine (SEROQUEL) 300 MG tablet   Oral   Take 600 mg by mouth at bedtime.         . simvastatin (ZOCOR) 40 MG tablet   Oral   Take 40 mg by mouth at bedtime.         . temazepam (RESTORIL) 15 MG capsule   Oral   Take 15 mg by mouth at bedtime as needed for sleep.         Marland Kitchen timolol (BETIMOL) 0.5 % ophthalmic solution   Right Eye   Place 1 drop into the right eye daily.           Allergies Other; Feldene; and Sulfa antibiotics  No family history on file.  Social History Social History  Substance Use Topics  . Smoking status: Never Smoker   . Smokeless tobacco: None  . Alcohol Use: No    Review of Systems Constitutional: No fever/chills Eyes: No visual changes. ENT: No sore throat. Cardiovascular: Denies chest pain. Respiratory: Denies shortness of breath. Gastrointestinal: No abdominal pain.  No nausea, no vomiting.  No diarrhea. No black or bloody stool. No constipation. Genitourinary: Negative for dysuria. Musculoskeletal:  See history of present illness Skin: Negative for rash. Neurological: Negative for headaches, focal weakness or numbness.  10-point ROS otherwise negative.  ____________________________________________   PHYSICAL EXAM:  VITAL SIGNS: ED Triage Vitals  Enc Vitals Group     BP 05/21/15 1109 139/59 mmHg     Pulse Rate 05/21/15 1109 54     Resp 05/21/15 1109 18     Temp 05/21/15 1109 98.1 F (36.7 C)     Temp Source 05/21/15 1109 Oral     SpO2 05/21/15 1109 100 %     Weight 05/21/15 1109 190 lb (86.183 kg)     Height 05/21/15 1109 6' (1.829 m)     Head Cir --      Peak Flow --      Pain Score 05/21/15 1110 9     Pain Loc --      Pain Edu? --      Excl. in [place]? --    Constitutional: Alert and oriented. Well appearing and in no acute distress. Eyes: Conjunctivae are normal. PERRL. EOMI. Head: Atraumatic. Nose: No congestion/rhinnorhea. Mouth/Throat: Mucous membranes are moist.  Oropharynx non-erythematous. Neck: No stridor.   Cardiovascular: Normal rate, regular rhythm. Grossly normal heart sounds.  Good peripheral circulation. Respiratory: Normal respiratory effort.  No retractions. Lungs CTAB. Gastrointestinal: Soft and nontender. No distention. No abdominal bruits. No CVA tenderness. Musculoskeletal: No lower extremity tenderness nor edema.  No joint effusions. Normal dorsalis pedis pulses bilaterally. Patient does endorse moderate tenderness to palpation along the right paraspinous muscles without deformity, erythema oral overlying lesion noted. Neurologic:  Normal speech and language. No gross focal neurologic deficits are appreciated. No gait instability. Skin:  Skin is warm, dry and intact. No rash noted. Psychiatric: Mood and affect are normal. Speech and behavior are normal.  ____________________________________________   LABS (all labs ordered are listed, but only abnormal results are displayed)  Labs Reviewed  BASIC METABOLIC PANEL - Abnormal; Notable for the  following:    Sodium 126 (*)    Chloride 94 (*)    Creatinine, Ser 0.48 (*)    Calcium 8.7 (*)    All other components within normal limits  CBC - Abnormal; Notable for the following:  RBC 3.07 (*)    Hemoglobin 9.3 (*)    HCT 27.0 (*)    RDW 15.0 (*)    All other components within normal limits  URINALYSIS COMPLETEWITH MICROSCOPIC ([facility] ONLY) - Abnormal; Notable for the following:    Color, Urine STRAW (*)    APPearance CLEAR (*)    Bacteria, UA RARE (*)    All other components within normal limits  TROPONIN I   ____________________________________________  EKG  Reviewed and interpreted by me at 1520 Normal sinus rhythm Heart rate 55 QRS 150 QTc 460 Reviewed and interpreted as probable left ventricular hypertrophy, Q waves noted anterolaterally septal distribution, no evidence acute ST elevation MI.   Patient denies chest pain, upper abdominal pain, or pulmonary symptoms.  ____________________________________________  RADIOLOGY  Follow-up on CT assigned to Dr. Reita Cliche ____________________________________________   PROCEDURES  Procedure(s) performed: None  Critical Care performed: No  ____________________________________________   INITIAL IMPRESSION / ASSESSMENT AND PLAN / ED COURSE  Pertinent labs & imaging results that were available during my care of the patient were reviewed by me and considered in my medical decision making (see chart for details).  Patient returns for evaluation of right low back pain for the last 3 days. No signs or symptoms suggest GI bleeding that he did have a recent bleeding ulcer. He has a history of taking NSAIDs for chronic aches and pains, and has been off recently. He is neurologically intact with no signs or symptoms of cauda equina. No evidence or symptoms of acute pulmonary or cardiac disease. Reassuring abdominal exam, I suspect most likely musculoskeletal but given the patient's age and pain we will obtain CT imaging to  further evaluate for causes including rule out abdominal dissection felt to be unlikely.   Mild hyponatremia noted, however patient asymptomatic. We'll hydrate with 500 mL of fluid here and advised follow-up.   Ongoing care including follow-up on CT angiogram of the abdomen and pelvis sign of Dr. Reita Cliche. If negative anticipate likely discharge. ____________________________________________   FINAL CLINICAL IMPRESSION(S) / ED DIAGNOSES  Final diagnoses:  None      Delman Kitten, MD 05/21/15 1657

## 2015-05-21 NOTE — ED Notes (Signed)
MD Lord at bedside. 

## 2015-05-24 ENCOUNTER — Encounter: Payer: Self-pay | Admitting: *Deleted

## 2015-05-24 ENCOUNTER — Ambulatory Visit (INDEPENDENT_AMBULATORY_CARE_PROVIDER_SITE_OTHER): Payer: Medicare Other | Admitting: Obstetrics and Gynecology

## 2015-05-24 ENCOUNTER — Encounter: Payer: Self-pay | Admitting: Obstetrics and Gynecology

## 2015-05-24 VITALS — BP 171/66 | HR 55 | Resp 16 | Ht 72.0 in | Wt 194.7 lb

## 2015-05-24 DIAGNOSIS — Z8546 Personal history of malignant neoplasm of prostate: Secondary | ICD-10-CM | POA: Diagnosis not present

## 2015-05-24 DIAGNOSIS — R350 Frequency of micturition: Secondary | ICD-10-CM

## 2015-05-24 DIAGNOSIS — R109 Unspecified abdominal pain: Secondary | ICD-10-CM

## 2015-05-24 LAB — URINALYSIS, COMPLETE
Bilirubin, UA: NEGATIVE
GLUCOSE, UA: NEGATIVE
Ketones, UA: NEGATIVE
Leukocytes, UA: NEGATIVE
NITRITE UA: NEGATIVE
PH UA: 7.5 (ref 5.0–7.5)
Protein, UA: NEGATIVE
RBC, UA: NEGATIVE
Specific Gravity, UA: 1.015 (ref 1.005–1.030)
UUROB: 0.2 mg/dL (ref 0.2–1.0)

## 2015-05-24 LAB — MICROSCOPIC EXAMINATION
BACTERIA UA: NONE SEEN
RBC, UA: NONE SEEN /hpf (ref 0–?)

## 2015-05-24 LAB — BLADDER SCAN AMB NON-IMAGING

## 2015-05-24 NOTE — Progress Notes (Signed)
05/24/2015 2:16 PM   Douglas Perkins. 1946/03/04 [id number]  Referring provider: No referring provider defined for this encounter.  Chief Complaint  Patient presents with  . 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 Bernardo Heater.  He presents today for follow-up after being seen in the emergency department on 05/20/1718 daily episode of bilateral lower back pain. A CT scan was performed at that time noting no GU abnormalities.  He reports that he has not seen Dr. Bernardo Heater in many years. He states that his PSA has been checked at the [place] and has remained less than 0.1 over the last 20 years.  Current urinary symptoms including difficulty emptying bladder, urinary frequency every 1-2 hours daily as well as nocturia 5-6 times per night. He also reports a weaker and occasionally split stream.  He does has a history of recurrent urinary tract infections prior to his prostatectomy but he denies any known STIs.   05/21/15 GFR> 60 Cr 0.48     PMH: Past Medical History  Diagnosis Date  . Hypertension   . Glaucoma   . Cancer (HCC)     merkel cell cancer  . Cancer [facility])     prostate  . TIA (transient ischemic attack)   . Bipolar 1 disorder [facility])     Surgical History: Past Surgical History  Procedure Laterality Date  . Prostate surgery    . Cholecystectomy    . Prostatectomy    . Leg surgery Left     distal  . Skin cancer excision  2006,2007    Merkle Cell Carcinoma    Home Medications:    Medication List       This list is accurate as of: 05/24/15  2:16 PM.  Always use your most recent med list.               amLODipine 5 MG tablet  Commonly known as:  NORVASC  Take 5 mg by mouth daily.     carbamazepine 200 MG tablet  Commonly known as:  TEGRETOL  Take 600 mg by mouth 2 (two) times daily.      esomeprazole 40 MG capsule  Commonly known as:  NEXIUM  Take 40 mg by mouth 2 (two) times daily.     Fish Oil 1000 MG Caps  Take 1,000 mg by mouth 2 (two) times daily.     gabapentin 300 MG capsule  Commonly known as:  NEURONTIN  Take 300 mg by mouth 2 (two) times daily.     glucosamine-chondroitin 500-400 MG tablet  Take 1 tablet by mouth daily.     latanoprost 0.005 % ophthalmic solution  Commonly known as:  XALATAN  Place 1 drop into both eyes at bedtime.     lisinopril 40 MG tablet  Commonly known as:  PRINIVIL,ZESTRIL  Take 40 mg by mouth daily.     metoprolol 50 MG tablet  Commonly known as:  LOPRESSOR  Take 50 mg by mouth 2 (two) times daily.     multivitamin with minerals Tabs tablet  Take 1 tablet by mouth daily.     oxyCODONE-acetaminophen 5-325 MG tablet  Commonly known as:  ROXICET  Take 1 tablet by mouth every 4 (four) hours as needed for severe pain.     QUEtiapine 300 MG tablet  Commonly known  as:  SEROQUEL  Take 600 mg by mouth at bedtime.     simvastatin 40 MG tablet  Commonly known as:  ZOCOR  Take 40 mg by mouth at bedtime.     temazepam 15 MG capsule  Commonly known as:  RESTORIL  Take 15 mg by mouth at bedtime as needed for sleep.     timolol 0.5 % ophthalmic solution  Commonly known as:  BETIMOL  Place 1 drop into the right eye daily.        Allergies:  Allergies  Allergen Reactions  . Other Hives, Shortness Of Breath and Other (See Comments)    Pt states that he is allergic to unwashed blood products.    . Feldene [Piroxicam] Hives  . Sulfa Antibiotics Hives    Family History: History reviewed. No pertinent family history.  Social History:  reports that he has quit smoking. He does not have any smokeless tobacco history on file. He reports that he does not drink alcohol or use illicit drugs.  ROS: UROLOGY Frequent Urination?: Yes Hard to postpone urination?: Yes Burning/pain with urination?: No Get up at night to urinate?:  Yes Leakage of urine?: Yes Urine stream starts and stops?: No Trouble starting stream?: No Do you have to strain to urinate?: Yes Blood in urine?: No Urinary tract infection?: No Sexually transmitted disease?: No Injury to kidneys or bladder?: Yes Painful intercourse?: No Weak stream?: No Erection problems?: Yes Penile pain?: No  Gastrointestinal Nausea?: No Vomiting?: No Indigestion/heartburn?: No Diarrhea?: No Constipation?: Yes  Constitutional Fever: No Night sweats?: No Weight loss?: No Fatigue?: Yes  Skin Skin rash/lesions?: Yes Itching?: No  Eyes Blurred vision?: No Double vision?: No  Ears/Nose/Throat Sore throat?: No Sinus problems?: No  Hematologic/Lymphatic Swollen glands?: No Easy bruising?: No  Cardiovascular Leg swelling?: Yes Chest pain?: No  Respiratory Cough?: No Shortness of breath?: No  Endocrine Excessive thirst?: No  Musculoskeletal Back pain?: Yes Joint pain?: Yes  Neurological Headaches?: No Dizziness?: No  Psychologic Depression?: Yes Anxiety?: Yes  Physical Exam: BP 171/66 mmHg  Pulse 55  Resp 16  Ht 6' (1.829 m)  Wt 194 lb 11.2 oz (88.315 kg)  BMI 26.40 kg/m2  Constitutional:  Alert and oriented, No acute distress. HEENT: [place] AT, moist mucus membranes.  Trachea midline, no masses. Cardiovascular: No clubbing, cyanosis, or edema. Respiratory: Normal respiratory effort, no increased work of breathing. GI: Abdomen is soft, nontender, nondistended, no abdominal masses GU: No CVA tenderness.  Normal circumcised phallus, testicles descended bilaterally without palpable masses or tenderness DRE; anterior rectal wall smooth without nodules Skin: No rashes, bruises or suspicious lesions. Lymph: No cervical or inguinal adenopathy. Neurologic: Grossly intact, no focal deficits, moving all 4 extremities. Psychiatric: Normal mood and affect.  Laboratory Data: Results for orders placed or performed in visit on 05/24/15   Microscopic Examination  Result Value Ref Range   WBC, UA 0-5 0 -  5 /hpf   RBC, UA None seen 0 -  2 /hpf   Epithelial Cells (non renal) 0-10 0 - 10 /hpf   Bacteria, UA None seen None seen/Few  Urinalysis, Complete  Result Value Ref Range   Specific Gravity, UA 1.015 1.005 - 1.030   pH, UA 7.5 5.0 - 7.5   Color, UA Yellow Yellow   Appearance Ur Clear Clear   Leukocytes, UA Negative Negative   Protein, UA Negative Negative/Trace   Glucose, UA Negative Negative   Ketones, UA Negative Negative   RBC, UA Negative Negative  Bilirubin, UA Negative Negative   Urobilinogen, Ur 0.2 0.2 - 1.0 mg/dL   Nitrite, UA Negative Negative   Microscopic Examination See below:   BLADDER SCAN AMB NON-IMAGING  Result Value Ref Range   Scan Result 0.0 mL     Pertinent Imaging: CLINICAL DATA: Back pain  EXAM: CTA ABDOMEN AND PELVIS wITHOUT AND WITH CONTRAST  TECHNIQUE: Multidetector CT imaging of the abdomen and pelvis was performed using the standard protocol during bolus administration of intravenous contrast. Multiplanar reconstructed images and MIPs were obtained and reviewed to evaluate the vascular anatomy.  CONTRAST: 165mL OMNIPAQUE IOHEXOL 350 MG/ML SOLN  COMPARISON: 03/11/2013, 09/17/2007  FINDINGS: Aorta is non aneurysmal and patent  Celiac is patent.  SMA is patent.  IMA is patent.  Two right renal arteries and 2 left renal arteries are patent.  Mild atherosclerotic changes of the iliac vasculature. Bilateral common and external iliac arteries are patent. Internal iliac arteries are grossly patent.  There is extensive calcified plaque in the right common femoral artery. Significant narrowing cannot be excluded.  Subsegmental atelectasis at the lung bases.  There are 2 hypodensities at the dome of the liver. The more posterior hypodensity on image 19 is new or larger than on prior studies.  Postcholecystectomy  Calcified granulomata in the  spleen.  Pancreas, adrenal glands, and kidneys are within normal limits  Ventral abdominal hernia contains adipose tissue. Small hiatal hernia.  Prominent stool burden throughout the colon.  Right inguinal hernia contains adipose tissue.  There is no free fluid. No abnormal adenopathy.  No vertebral compression deformity. Left paracentral disc herniation at L4-5 is suspected.  Review of the MIP images confirms the above findings.  IMPRESSION: No evidence of aortic aneurysm or acute arterial abnormality.  There are 2 hypodensities at the dome of the liver. One of them is new or larger than on prior studies. If the patient has a history of malignancy, consider MRI to further delineate  L4-5 disc herniation is suspected. MRI may be helpful to further characterize.  Hiatal hernia. Ventral abdominal hernia. A right inguinal hernia.  Electronically Signed  By: Marybelle Killings M.D.  On: 05/21/2015 16:44  Assessment & Plan:    1. Urinary Frequency- PVR 61ml. 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 Patient will follow up for uroflow and repeat PVR in 2 weeks. He will be seen by an M.D. to review results and discuss possible interventions.  2. Lower back pain- 05/21/15 CT noting no GU abnormalities.   3. 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 Bernardo Heater.  He reports that he has not seen Dr. Bernardo Heater in many years. He states that his PSA has been checked at the [place] and has remained less than 0.1 over the last 20 years. Previous PSAs requested.  There are no diagnoses linked to this encounter.  Return in about 2 weeks (around 06/07/2015) for Uroflow/PVR recheck urinary frequency.  These notes generated with voice recognition software. I apologize for typographical errors.  Herbert Moors, [facility] [address], [place], [place] [address] [phone]

## 2015-05-25 ENCOUNTER — Ambulatory Visit: Payer: Medicare Other | Admitting: Oncology

## 2015-05-27 ENCOUNTER — Inpatient Hospital Stay: Payer: Medicare Other | Attending: Oncology | Admitting: Oncology

## 2015-05-27 VITALS — BP 178/76 | HR 54 | Temp 97.8°F | Resp 18 | Wt 194.0 lb

## 2015-05-27 DIAGNOSIS — Z85821 Personal history of Merkel cell carcinoma: Secondary | ICD-10-CM

## 2015-05-27 DIAGNOSIS — F419 Anxiety disorder, unspecified: Secondary | ICD-10-CM

## 2015-05-27 DIAGNOSIS — M549 Dorsalgia, unspecified: Secondary | ICD-10-CM

## 2015-05-27 DIAGNOSIS — D649 Anemia, unspecified: Secondary | ICD-10-CM | POA: Diagnosis not present

## 2015-05-27 DIAGNOSIS — Z9079 Acquired absence of other genital organ(s): Secondary | ICD-10-CM | POA: Diagnosis not present

## 2015-05-27 DIAGNOSIS — Z8673 Personal history of transient ischemic attack (TIA), and cerebral infarction without residual deficits: Secondary | ICD-10-CM | POA: Diagnosis not present

## 2015-05-27 DIAGNOSIS — Z79899 Other long term (current) drug therapy: Secondary | ICD-10-CM

## 2015-05-27 DIAGNOSIS — Z87891 Personal history of nicotine dependence: Secondary | ICD-10-CM

## 2015-05-27 DIAGNOSIS — Z8719 Personal history of other diseases of the digestive system: Secondary | ICD-10-CM

## 2015-05-27 DIAGNOSIS — I10 Essential (primary) hypertension: Secondary | ICD-10-CM | POA: Insufficient documentation

## 2015-05-27 DIAGNOSIS — R600 Localized edema: Secondary | ICD-10-CM

## 2015-05-27 DIAGNOSIS — F319 Bipolar disorder, unspecified: Secondary | ICD-10-CM

## 2015-05-27 DIAGNOSIS — K769 Liver disease, unspecified: Secondary | ICD-10-CM | POA: Insufficient documentation

## 2015-05-27 DIAGNOSIS — E871 Hypo-osmolality and hyponatremia: Secondary | ICD-10-CM | POA: Insufficient documentation

## 2015-05-27 DIAGNOSIS — C4A9 Merkel cell carcinoma, unspecified: Secondary | ICD-10-CM

## 2015-05-27 NOTE — Progress Notes (Signed)
Patient was admitted in hospital in [facility] for GI bleed in 04/2015.  Came to [facility] for back pain and had a CT which showed abnormalities and MRI recommended.

## 2015-05-28 ENCOUNTER — Ambulatory Visit: Payer: Self-pay

## 2015-05-30 NOTE — Progress Notes (Signed)
[place]  [phone] [phone]  ID: Anselm Lis. OB: 1946/01/11  MR#: [id number]  [id number]  Patient Care Team: [name], MD as PCP - General (Family Medicine) Seeplaputhur Robinette Haines, MD (General Surgery)  CHIEF COMPLAINT:  Chief Complaint  Patient presents with  . ER f/u    INTERVAL HISTORY: Patient referred to clinic for ER follow-up to further evaluate new liver lesions. Patient was admitted to the hospital in December 2016 with her GI bleed likely secondary to gastritis. He recently went to the emergency room with significant back pain in which CT scan revealed the abnormalities in his liver.  He currently feels well and is asymptomatic. He has had no recent fevers or illnesses.  He has a good appetite and denies weight loss.  He denies any nausea, vomiting, constipation, or diarrhea.  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  He offers no further specific complaints today.   REVIEW OF SYSTEMS:   Review of Systems  Constitutional: Negative for fever, weight loss and malaise/fatigue.  Respiratory: Negative.  Negative for shortness of breath.   Cardiovascular: Negative.  Negative for chest pain.  Gastrointestinal: Negative.  Negative for nausea, vomiting, abdominal pain, diarrhea, constipation, blood in stool and melena.  Musculoskeletal: Positive for back pain.  Neurological: Negative.  Negative for weakness.  Psychiatric/Behavioral: The patient is nervous/anxious.     As per HPI. Otherwise, a complete review of systems is negatve.  PAST MEDICAL HISTORY: Past Medical History  Diagnosis Date  . Hypertension   . Glaucoma   . Cancer (HCC)     merkel cell cancer  . Cancer [facility])     prostate  . TIA (transient ischemic attack)   . Bipolar 1 disorder ([place])     PAST SURGICAL HISTORY: Past Surgical History  Procedure Laterality Date  .  Prostate surgery    . Cholecystectomy    . Prostatectomy    . Leg surgery Left     distal  . Skin cancer excision  [id number]    Merkle Cell Carcinoma    FAMILY HISTORY: Heart disease and bipolar disease.    ADVANCED DIRECTIVES:    HEALTH MAINTENANCE: Social History  Substance Use Topics  . Smoking status: Former Research scientist (life sciences)  . Smokeless tobacco: Not on file  . Alcohol Use: No     Colonoscopy:  PAP:  Bone density:  Lipid panel:  Allergies  Allergen Reactions  . Other Hives, Shortness Of Breath and Other (See Comments)    Pt states that he is allergic to unwashed blood products.    . Feldene [Piroxicam] Hives  . Sulfa Antibiotics Hives    Current Outpatient Prescriptions  Medication Sig Dispense Refill  . amLODipine (NORVASC) 5 MG tablet Take 5 mg by mouth daily.    . carbamazepine (TEGRETOL) 200 MG tablet Take 600 mg by mouth 2 (two) times daily.    Marland Kitchen esomeprazole (NEXIUM) 40 MG capsule Take 40 mg by mouth 2 (two) times daily.    Marland Kitchen gabapentin (NEURONTIN) 300 MG capsule Take 300 mg by mouth 2 (two) times daily.    Marland Kitchen glucosamine-chondroitin 500-400 MG tablet Take 1 tablet by mouth daily.    Marland Kitchen latanoprost (XALATAN) 0.005 % ophthalmic solution Place 1 drop into both eyes at bedtime.    Marland Kitchen lisinopril (PRINIVIL,ZESTRIL) 40 MG tablet Take 20 mg by mouth daily.     . metoprolol (LOPRESSOR) 50 MG tablet  Take 50 mg by mouth 2 (two) times daily.    . Multiple Vitamin (MULTIVITAMIN WITH MINERALS) TABS tablet Take 1 tablet by mouth daily.    . Omega-3 Fatty Acids (FISH OIL) 1000 MG CAPS Take 1,000 mg by mouth 2 (two) times daily.     Marland Kitchen oxyCODONE-acetaminophen (ROXICET) 5-325 MG tablet Take 1 tablet by mouth every 4 (four) hours as needed for severe pain. 10 tablet 0  . pantoprazole (PROTONIX) 40 MG tablet Take 40 mg by mouth 2 (two) times daily.    . QUEtiapine (SEROQUEL) 300 MG tablet Take 600 mg by mouth at bedtime.    . simvastatin (ZOCOR) 40 MG tablet Take 40 mg by mouth at bedtime.     . temazepam (RESTORIL) 15 MG capsule Take 15 mg by mouth at bedtime as needed for sleep.    Marland Kitchen timolol (BETIMOL) 0.5 % ophthalmic solution Place 1 drop into the right eye daily.     No current facility-administered medications for this visit.    OBJECTIVE: Filed Vitals:   05/27/15 1356  BP: 178/76  Pulse: 54  Temp: 97.8 F (36.6 C)  Resp: 18     Body mass index is 26.31 kg/(m^2).    ECOG FS:0 - Asymptomatic  General: Well-developed, well-nourished, no acute distress. Eyes: Pink conjunctiva, anicteric sclera. Lungs: Clear to auscultation bilaterally. Heart: Regular rate and rhythm. No rubs, murmurs, or gallops. Abdomen: Soft, nontender, nondistended. No organomegaly noted, normoactive bowel sounds. Musculoskeletal: No edema, cyanosis, or clubbing. Neuro: Alert, answering all questions appropriately. Cranial nerves grossly intact. Skin: No rashes or petechiae noted. Psych: Normal affect.   LAB RESULTS:  Lab Results  Component Value Date   NA 126* 05/21/2015   K 4.7 05/21/2015   CL 94* 05/21/2015   CO2 27 05/21/2015   GLUCOSE 92 05/21/2015   BUN 7 05/21/2015   CREATININE 0.48* 05/21/2015   CALCIUM 8.7* 05/21/2015   PROT 6.8 08/17/2014   ALBUMIN 4.2 08/17/2014   AST 25 08/17/2014   ALT 28 08/17/2014   ALKPHOS 54 08/17/2014   BILITOT 0.6 08/17/2014   GFRNONAA >60 05/21/2015   GFRAA >60 05/21/2015    Lab Results  Component Value Date   WBC 4.4 05/21/2015   NEUTROABS 3.6 11/29/2014   HGB 9.3* 05/21/2015   HCT 27.0* 05/21/2015   MCV 87.9 05/21/2015   PLT 320 05/21/2015     STUDIES: Ct Cta Abd/pel W/cm &/or W/o Cm  05/21/2015  CLINICAL DATA:  Back pain EXAM: CTA ABDOMEN AND PELVIS wITHOUT AND WITH CONTRAST TECHNIQUE: Multidetector CT imaging of the abdomen and pelvis was performed using the standard protocol during bolus administration of intravenous contrast. Multiplanar reconstructed images and MIPs were obtained and reviewed to evaluate the vascular anatomy.  CONTRAST:  146mL OMNIPAQUE IOHEXOL 350 MG/ML SOLN COMPARISON:  03/11/2013, 09/17/2007 FINDINGS: Aorta is non aneurysmal and patent Celiac is patent. SMA is patent. IMA is patent. Two right renal arteries and 2 left renal arteries are patent. Mild atherosclerotic changes of the iliac vasculature. Bilateral common and external iliac arteries are patent. Internal iliac arteries are grossly patent. There is extensive calcified plaque in the right common femoral artery. Significant narrowing cannot be excluded. Subsegmental atelectasis at the lung bases. There are 2 hypodensities at the dome of the liver. The more posterior hypodensity on image 19 is new or larger than on prior studies. Postcholecystectomy Calcified granulomata in the spleen. Pancreas, adrenal glands, and kidneys are within normal limits Ventral abdominal hernia contains adipose tissue. Small  hiatal hernia. Prominent stool burden throughout the colon. Right inguinal hernia contains adipose tissue. There is no free fluid.  No abnormal adenopathy. No vertebral compression deformity. Left paracentral disc herniation at L4-5 is suspected. Review of the MIP images confirms the above findings. IMPRESSION: No evidence of aortic aneurysm or acute arterial abnormality. There are 2 hypodensities at the dome of the liver. One of them is new or larger than on prior studies. If the patient has a history of malignancy, consider MRI to further delineate L4-5 disc herniation is suspected. MRI may be helpful to further characterize. Hiatal hernia.  Ventral abdominal hernia.  A right inguinal hernia. Electronically Signed   By: Marybelle Killings M.D.   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 Adjuvant chemoradiation therapy completed in July of 2007.  PLAN:    1. Merkel cell:  PET scan from November 2014 was negative for any recurrence, no further imaging is necessary.  The remainder of his laboratory work is also within  normal limits.  No intervention is needed at this time.  Patient has been instructed to keep his previously scheduled follow-up appointment in one year for repeat laboratory work and further evaluation.  At this point, patient will be 10 years removed from his adjuvant therapy and his most recent occurrence. He can then be discharged from clinic.  2. Liver lesions: Unclear etiology, will get an MRI of the abdomen for further evaluation. Return to clinic several days after his MRI to discuss the results. 3. Anemia: Patient had a recent GI bleed, monitor. 4. Hyponatremia: Unclear etiology, monitor.  Approximately 30 minutes was spent in discussion of which greater than 50% was consultation.  Patient expressed understanding and was in agreement with this plan. He also understands that He can call clinic at any time with any questions, concerns, or complaints.     Lloyd Huger, MD   05/30/2015 7:26 AM

## 2015-06-07 ENCOUNTER — Other Ambulatory Visit: Payer: Self-pay | Admitting: Oncology

## 2015-06-07 DIAGNOSIS — C4A9 Merkel cell carcinoma, unspecified: Secondary | ICD-10-CM | POA: Insufficient documentation

## 2015-06-07 DIAGNOSIS — Z85821 Personal history of Merkel cell carcinoma: Secondary | ICD-10-CM | POA: Insufficient documentation

## 2015-06-08 ENCOUNTER — Ambulatory Visit
Admission: RE | Admit: 2015-06-08 | Discharge: 2015-06-08 | Disposition: A | Discharge status: Home / Self Care | Payer: Medicare Other | Source: Ambulatory Visit | Attending: Oncology | Admitting: Oncology

## 2015-06-08 DIAGNOSIS — K769 Liver disease, unspecified: Secondary | ICD-10-CM | POA: Diagnosis present

## 2015-06-08 DIAGNOSIS — C4A9 Merkel cell carcinoma, unspecified: Secondary | ICD-10-CM | POA: Diagnosis present

## 2015-06-08 MED ORDER — GADOBENATE DIMEGLUMINE 529 MG/ML IV SOLN
20.0000 mL | Freq: Once | INTRAVENOUS | Status: AC | PRN
  Administered 2015-06-08: 18 mL via INTRAVENOUS

## 2015-06-09 ENCOUNTER — Ambulatory Visit (INDEPENDENT_AMBULATORY_CARE_PROVIDER_SITE_OTHER): Payer: Medicare Other | Admitting: Urology

## 2015-06-09 ENCOUNTER — Inpatient Hospital Stay: Payer: Medicare Other

## 2015-06-09 ENCOUNTER — Encounter: Payer: Self-pay | Admitting: Urology

## 2015-06-09 ENCOUNTER — Inpatient Hospital Stay: Payer: Medicare Other | Attending: Oncology | Admitting: Oncology

## 2015-06-09 VITALS — BP 179/71 | HR 57 | Ht 72.0 in | Wt 201.9 lb

## 2015-06-09 DIAGNOSIS — R350 Frequency of micturition: Secondary | ICD-10-CM | POA: Diagnosis not present

## 2015-06-09 DIAGNOSIS — Z8546 Personal history of malignant neoplasm of prostate: Secondary | ICD-10-CM

## 2015-06-10 ENCOUNTER — Encounter: Payer: Self-pay | Admitting: Urology

## 2015-06-10 NOTE — Progress Notes (Signed)
3:13 PM  06/09/15  Douglas Perkins. Dec 04, 1945 [id number]  Referring provider: [name], MD [facility] [place], [place] [address]  No chief complaint on file.   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 Bernardo Heater.  He was recently seen by our nurse practitioner for further evaluation of lower back pain at which time CT scan showed no evidence of GU pathology.  On further workup, he did have fairly significant voiding symptoms occluding weak urinary stream, occasional difficulty emptying his bladder, urinary frequency and nocturia. He also reported occasional split stream.  He was scheduled to come back to our office today for further evaluation of his urinary symptoms, uroflow and PVR.  Unfortunately, he was unable to hold it and voided just prior to presenting to our office. Postvoid residual was minimal.  Despite his urinary frequency and symptoms, his overall bother is minimal.  His PSA has remained undetectable for greater than 20 years which is followed at the [place] annually where he receives benefits for agent orange.   PMH: Past Medical History  Diagnosis Date  . Hypertension   . Glaucoma   . Cancer (HCC)     merkel cell cancer  . Cancer [facility])     prostate  . TIA (transient ischemic attack)   . Bipolar 1 disorder [facility])     Surgical History: Past Surgical History  Procedure Laterality Date  . Prostate surgery    . Cholecystectomy    . Prostatectomy    . Leg surgery Left     distal  . Skin cancer excision  2006,2007    Merkle Cell Carcinoma    Home Medications:    Medication List       This list is accurate as of: 06/09/15 11:59 PM.  Always use your most recent med list.               amLODipine 5 MG tablet  Commonly known as:  NORVASC  Take 5 mg by mouth daily.     carbamazepine 200 MG tablet  Commonly known as:  TEGRETOL  Take  600 mg by mouth 2 (two) times daily.     esomeprazole 40 MG capsule  Commonly known as:  NEXIUM  Take 40 mg by mouth 2 (two) times daily.     Fish Oil 1000 MG Caps  Take 1,000 mg by mouth 2 (two) times daily.     gabapentin 300 MG capsule  Commonly known as:  NEURONTIN  Take 300 mg by mouth 2 (two) times daily.     glucosamine-chondroitin 500-400 MG tablet  Take 1 tablet by mouth daily.     latanoprost 0.005 % ophthalmic solution  Commonly known as:  XALATAN  Place 1 drop into both eyes at bedtime.     lisinopril 40 MG tablet  Commonly known as:  PRINIVIL,ZESTRIL  Take 20 mg by mouth daily.     metoprolol 50 MG tablet  Commonly known as:  LOPRESSOR  Take 50 mg by mouth 2 (two) times daily.     multivitamin with minerals Tabs tablet  Take 1 tablet by mouth daily.     pantoprazole 40 MG tablet  Commonly known as:  PROTONIX  Take 40 mg by mouth 2 (two) times daily. Reported on 06/09/2015     QUEtiapine 300 MG tablet  Commonly known as:  SEROQUEL  Take 600 mg by mouth at bedtime.  simvastatin 40 MG tablet  Commonly known as:  ZOCOR  Take 40 mg by mouth at bedtime.     temazepam 15 MG capsule  Commonly known as:  RESTORIL  Take 15 mg by mouth at bedtime as needed for sleep.     timolol 0.5 % ophthalmic solution  Commonly known as:  BETIMOL  Place 1 drop into the right eye daily.        Allergies:  Allergies  Allergen Reactions  . Other Hives, Shortness Of Breath and Other (See Comments)    Pt states that he is allergic to unwashed blood products.    . Feldene [Piroxicam] Hives  . Sulfa Antibiotics Hives    Family History: History reviewed. No pertinent family history.  Social History:  reports that he has quit smoking. He does not have any smokeless tobacco history on file. He reports that he does not drink alcohol or use illicit drugs.  ROS: UROLOGY Frequent Urination?: Yes Hard to postpone urination?: No Burning/pain with urination?: No Get up at  night to urinate?: Yes Leakage of urine?: No Urine stream starts and stops?: No Trouble starting stream?: No Do you have to strain to urinate?: Yes Blood in urine?: No Urinary tract infection?: No Sexually transmitted disease?: Yes Injury to kidneys or bladder?: No Painful intercourse?: No Weak stream?: No Erection problems?: Yes Penile pain?: No  Gastrointestinal Nausea?: No Vomiting?: No Indigestion/heartburn?: No Diarrhea?: No Constipation?: Yes  Constitutional Fever: No Night sweats?: No Weight loss?: No Fatigue?: Yes  Skin Skin rash/lesions?: No Itching?: No  Eyes Blurred vision?: No Double vision?: No  Ears/Nose/Throat Sore throat?: No Sinus problems?: No  Hematologic/Lymphatic Swollen glands?: No Easy bruising?: No  Cardiovascular Leg swelling?: Yes Chest pain?: No  Respiratory Cough?: No Shortness of breath?: No  Endocrine Excessive thirst?: No  Musculoskeletal Back pain?: Yes Joint pain?: Yes  Neurological Headaches?: No Dizziness?: Yes  Psychologic Depression?: No Anxiety?: No  Physical Exam: BP 179/71 mmHg  Pulse 57  Ht 6' (1.829 m)  Wt 201 lb 14.4 oz (91.581 kg)  BMI 27.38 kg/m2  Constitutional:  Alert and oriented, No acute distress. HEENT: [place] AT, moist mucus membranes.  Trachea midline, no masses. Cardiovascular: No clubbing, cyanosis, or edema. Respiratory: Normal respiratory effort, no increased work of breathing. GI: Abdomen is soft, nontender, nondistended, no abdominal masses GU: No CVA tenderness.  Normal circumcised phallus with patient urethral meatus, testicles descended bilaterally without palpable masses or tenderness Skin: No rashes, bruises or suspicious lesions. Neurologic: Grossly intact, no focal deficits, moving all 4 extremities. Psychiatric: Normal mood and affect.  Laboratory Data: Results for orders placed or performed in visit on 05/24/15  Microscopic Examination  Result Value Ref Range   WBC,  UA 0-5 0 -  5 /hpf   RBC, UA None seen 0 -  2 /hpf   Epithelial Cells (non renal) 0-10 0 - 10 /hpf   Bacteria, UA None seen None seen/Few  Urinalysis, Complete  Result Value Ref Range   Specific Gravity, UA 1.015 1.005 - 1.030   pH, UA 7.5 5.0 - 7.5   Color, UA Yellow Yellow   Appearance Ur Clear Clear   Leukocytes, UA Negative Negative   Protein, UA Negative Negative/Trace   Glucose, UA Negative Negative   Ketones, UA Negative Negative   RBC, UA Negative Negative   Bilirubin, UA Negative Negative   Urobilinogen, Ur 0.2 0.2 - 1.0 mg/dL   Nitrite, UA Negative Negative   Microscopic Examination See below:  BLADDER SCAN AMB NON-IMAGING  Result Value Ref Range   Scan Result 0.0 mL     Assessment & Plan:     1. Urinary frequency After lengthy discussion today, despite patient's significant urinary symptoms, he has very little bother. He does not desire any further diagnostic workup or treatment at this time. He is advised to return if he likes to proceed with any intervention, therapy, etc.  Bladder emptying adequate  Normal renal function  - BLADDER SCAN AMB NON-IMAGING  2. H/O prostate cancer No recurrence >20 years.   Annual PSA at [facility]  Return in about 1 year (around 06/08/2016) for PVR, review symptoms, f/u PSA.  These notes generated with voice recognition software. I apologize for typographical errors.  Hollice Espy, MD  [facility] [address] [place], [place] [address] [phone]

## 2015-06-18 DIAGNOSIS — Z7185 Encounter for immunization safety counseling: Secondary | ICD-10-CM | POA: Insufficient documentation

## 2015-06-18 DIAGNOSIS — Z8719 Personal history of other diseases of the digestive system: Secondary | ICD-10-CM | POA: Insufficient documentation

## 2015-06-18 DIAGNOSIS — Z7189 Other specified counseling: Secondary | ICD-10-CM | POA: Insufficient documentation

## 2015-06-18 DIAGNOSIS — F3111 Bipolar disorder, current episode manic without psychotic features, mild: Secondary | ICD-10-CM | POA: Insufficient documentation

## 2015-06-23 ENCOUNTER — Emergency Department: Payer: Medicare Other

## 2015-06-23 ENCOUNTER — Encounter: Payer: Self-pay | Admitting: *Deleted

## 2015-06-23 ENCOUNTER — Inpatient Hospital Stay
Admission: EM | Admit: 2015-06-23 | Discharge: 2015-06-27 | DRG: 193 | Disposition: A | Discharge status: Other Acute Inpatient Hospital | Payer: Medicare Other | Attending: Specialist | Admitting: Specialist

## 2015-06-23 DIAGNOSIS — Z8673 Personal history of transient ischemic attack (TIA), and cerebral infarction without residual deficits: Secondary | ICD-10-CM | POA: Diagnosis not present

## 2015-06-23 DIAGNOSIS — Z87891 Personal history of nicotine dependence: Secondary | ICD-10-CM | POA: Diagnosis not present

## 2015-06-23 DIAGNOSIS — I454 Nonspecific intraventricular block: Secondary | ICD-10-CM | POA: Diagnosis present

## 2015-06-23 DIAGNOSIS — H409 Unspecified glaucoma: Secondary | ICD-10-CM | POA: Diagnosis present

## 2015-06-23 DIAGNOSIS — E222 Syndrome of inappropriate secretion of antidiuretic hormone: Secondary | ICD-10-CM | POA: Diagnosis present

## 2015-06-23 DIAGNOSIS — E785 Hyperlipidemia, unspecified: Secondary | ICD-10-CM | POA: Diagnosis present

## 2015-06-23 DIAGNOSIS — K219 Gastro-esophageal reflux disease without esophagitis: Secondary | ICD-10-CM | POA: Diagnosis present

## 2015-06-23 DIAGNOSIS — F319 Bipolar disorder, unspecified: Secondary | ICD-10-CM | POA: Diagnosis present

## 2015-06-23 DIAGNOSIS — Z85828 Personal history of other malignant neoplasm of skin: Secondary | ICD-10-CM | POA: Diagnosis not present

## 2015-06-23 DIAGNOSIS — I10 Essential (primary) hypertension: Secondary | ICD-10-CM | POA: Diagnosis present

## 2015-06-23 DIAGNOSIS — R41840 Attention and concentration deficit: Secondary | ICD-10-CM

## 2015-06-23 DIAGNOSIS — J189 Pneumonia, unspecified organism: Secondary | ICD-10-CM | POA: Diagnosis not present

## 2015-06-23 DIAGNOSIS — I469 Cardiac arrest, cause unspecified: Secondary | ICD-10-CM | POA: Diagnosis not present

## 2015-06-23 DIAGNOSIS — Z888 Allergy status to other drugs, medicaments and biological substances status: Secondary | ICD-10-CM

## 2015-06-23 DIAGNOSIS — R001 Bradycardia, unspecified: Secondary | ICD-10-CM | POA: Diagnosis present

## 2015-06-23 DIAGNOSIS — Z8572 Personal history of non-Hodgkin lymphomas: Secondary | ICD-10-CM

## 2015-06-23 DIAGNOSIS — D649 Anemia, unspecified: Secondary | ICD-10-CM | POA: Diagnosis present

## 2015-06-23 DIAGNOSIS — I739 Peripheral vascular disease, unspecified: Secondary | ICD-10-CM | POA: Diagnosis present

## 2015-06-23 DIAGNOSIS — I509 Heart failure, unspecified: Secondary | ICD-10-CM | POA: Diagnosis present

## 2015-06-23 DIAGNOSIS — Z882 Allergy status to sulfonamides status: Secondary | ICD-10-CM | POA: Diagnosis not present

## 2015-06-23 DIAGNOSIS — R079 Chest pain, unspecified: Secondary | ICD-10-CM

## 2015-06-23 DIAGNOSIS — Z79899 Other long term (current) drug therapy: Secondary | ICD-10-CM

## 2015-06-23 DIAGNOSIS — J9601 Acute respiratory failure with hypoxia: Secondary | ICD-10-CM

## 2015-06-23 DIAGNOSIS — R109 Unspecified abdominal pain: Secondary | ICD-10-CM

## 2015-06-23 LAB — BASIC METABOLIC PANEL
ANION GAP: 9 (ref 5–15)
BUN: 10 mg/dL (ref 6–20)
CALCIUM: 8.5 mg/dL — AB (ref 8.9–10.3)
CO2: 25 mmol/L (ref 22–32)
Chloride: 98 mmol/L — ABNORMAL LOW (ref 101–111)
Creatinine, Ser: 0.51 mg/dL — ABNORMAL LOW (ref 0.61–1.24)
Glucose, Bld: 134 mg/dL — ABNORMAL HIGH (ref 65–99)
POTASSIUM: 4.4 mmol/L (ref 3.5–5.1)
Sodium: 132 mmol/L — ABNORMAL LOW (ref 135–145)

## 2015-06-23 LAB — CBC
HCT: 27.2 % — ABNORMAL LOW (ref 40.0–52.0)
HEMOGLOBIN: 8.9 g/dL — AB (ref 13.0–18.0)
MCH: 27.1 pg (ref 26.0–34.0)
MCHC: 32.6 g/dL (ref 32.0–36.0)
MCV: 83.1 fL (ref 80.0–100.0)
PLATELETS: 347 10*3/uL (ref 150–440)
RBC: 3.27 MIL/uL — AB (ref 4.40–5.90)
RDW: 17.8 % — ABNORMAL HIGH (ref 11.5–14.5)
WBC: 8.4 10*3/uL (ref 3.8–10.6)

## 2015-06-23 LAB — TROPONIN I
TROPONIN I: 0.05 ng/mL — AB (ref <0.031)
TROPONIN I: 0.06 ng/mL — AB (ref <0.031)

## 2015-06-23 LAB — RAPID INFLUENZA A&B ANTIGENS: Influenza A (ARMC): NOT DETECTED

## 2015-06-23 LAB — RAPID INFLUENZA A&B ANTIGENS (ARMC ONLY): INFLUENZA B (ARMC): NOT DETECTED

## 2015-06-23 MED ORDER — SODIUM CHLORIDE 0.9% FLUSH
3.0000 mL | Freq: Two times a day (BID) | INTRAVENOUS | Status: DC
  Administered 2015-06-23 – 2015-06-27 (×8): 3 mL via INTRAVENOUS

## 2015-06-23 MED ORDER — IOHEXOL 350 MG/ML SOLN
125.0000 mL | Freq: Once | INTRAVENOUS | Status: AC | PRN
  Administered 2015-06-23: 125 mL via INTRAVENOUS

## 2015-06-23 MED ORDER — AMLODIPINE BESYLATE 5 MG PO TABS
5.0000 mg | ORAL_TABLET | Freq: Every day | ORAL | Status: DC
  Administered 2015-06-24 – 2015-06-27 (×4): 5 mg via ORAL
  Filled 2015-06-23 (×4): qty 1

## 2015-06-23 MED ORDER — LISINOPRIL 20 MG PO TABS
40.0000 mg | ORAL_TABLET | Freq: Every evening | ORAL | Status: DC
  Administered 2015-06-23 – 2015-06-26 (×4): 40 mg via ORAL
  Filled 2015-06-23 (×5): qty 2

## 2015-06-23 MED ORDER — ARIPIPRAZOLE 5 MG PO TABS
5.0000 mg | ORAL_TABLET | Freq: Two times a day (BID) | ORAL | Status: DC
  Administered 2015-06-23 – 2015-06-27 (×8): 5 mg via ORAL
  Filled 2015-06-23 (×10): qty 1

## 2015-06-23 MED ORDER — TRAZODONE HCL 50 MG PO TABS: 25.0000 mg | ORAL_TABLET | Freq: Every evening | ORAL | Status: DC | PRN

## 2015-06-23 MED ORDER — GLUCOSAMINE-CHONDROITIN 500-400 MG PO TABS: 1.0000 | ORAL_TABLET | Freq: Every day | ORAL | Status: DC

## 2015-06-23 MED ORDER — IPRATROPIUM-ALBUTEROL 0.5-2.5 (3) MG/3ML IN SOLN
3.0000 mL | Freq: Once | RESPIRATORY_TRACT | Status: AC
  Administered 2015-06-23: 3 mL via RESPIRATORY_TRACT
  Filled 2015-06-23: qty 3

## 2015-06-23 MED ORDER — PANTOPRAZOLE SODIUM 40 MG PO TBEC
40.0000 mg | DELAYED_RELEASE_TABLET | Freq: Two times a day (BID) | ORAL | Status: DC
  Administered 2015-06-23 – 2015-06-27 (×8): 40 mg via ORAL
  Filled 2015-06-23 (×8): qty 1

## 2015-06-23 MED ORDER — TIMOLOL HEMIHYDRATE 0.5 % OP SOLN
1.0000 [drp] | Freq: Every day | OPHTHALMIC | Status: DC
  Administered 2015-06-24 – 2015-06-27 (×4): 1 [drp] via OPHTHALMIC
  Filled 2015-06-23: qty 5

## 2015-06-23 MED ORDER — SIMVASTATIN 40 MG PO TABS
40.0000 mg | ORAL_TABLET | Freq: Every day | ORAL | Status: DC
  Administered 2015-06-23: 40 mg via ORAL
  Filled 2015-06-23: qty 1

## 2015-06-23 MED ORDER — DOCUSATE SODIUM 100 MG PO CAPS
100.0000 mg | ORAL_CAPSULE | Freq: Two times a day (BID) | ORAL | Status: DC
  Administered 2015-06-24 – 2015-06-27 (×7): 100 mg via ORAL
  Filled 2015-06-23 (×7): qty 1

## 2015-06-23 MED ORDER — CARBAMAZEPINE 200 MG PO TABS
600.0000 mg | ORAL_TABLET | Freq: Two times a day (BID) | ORAL | Status: DC
  Administered 2015-06-23 – 2015-06-24 (×2): 600 mg via ORAL
  Filled 2015-06-23 (×2): qty 3

## 2015-06-23 MED ORDER — ENOXAPARIN SODIUM 40 MG/0.4ML ~~LOC~~ SOLN
40.0000 mg | SUBCUTANEOUS | Status: DC
  Filled 2015-06-23 (×4): qty 0.4

## 2015-06-23 MED ORDER — METHYLPREDNISOLONE SODIUM SUCC 40 MG IJ SOLR
40.0000 mg | Freq: Two times a day (BID) | INTRAMUSCULAR | Status: DC
  Administered 2015-06-23 – 2015-06-27 (×8): 40 mg via INTRAVENOUS
  Filled 2015-06-23 (×8): qty 1

## 2015-06-23 MED ORDER — OXYCODONE HCL 5 MG PO TABS
5.0000 mg | ORAL_TABLET | ORAL | Status: DC | PRN
Start: 2015-06-23 — End: 2015-06-27

## 2015-06-23 MED ORDER — LATANOPROST 0.005 % OP SOLN
1.0000 [drp] | Freq: Every day | OPHTHALMIC | Status: DC
  Administered 2015-06-24 – 2015-06-26 (×5): 1 [drp] via OPHTHALMIC
  Filled 2015-06-23 (×2): qty 2.5

## 2015-06-23 MED ORDER — ALBUTEROL SULFATE (2.5 MG/3ML) 0.083% IN NEBU
2.5000 mg | INHALATION_SOLUTION | RESPIRATORY_TRACT | Status: DC | PRN
  Administered 2015-06-25: 21:00:00 2.5 mg via RESPIRATORY_TRACT
  Filled 2015-06-23 (×2): qty 3

## 2015-06-23 MED ORDER — ACETAMINOPHEN 325 MG PO TABS
650.0000 mg | ORAL_TABLET | Freq: Four times a day (QID) | ORAL | Status: DC | PRN
  Administered 2015-06-23 – 2015-06-26 (×6): 650 mg via ORAL
  Filled 2015-06-23 (×7): qty 2

## 2015-06-23 MED ORDER — IPRATROPIUM-ALBUTEROL 0.5-2.5 (3) MG/3ML IN SOLN
3.0000 mL | Freq: Four times a day (QID) | RESPIRATORY_TRACT | Status: DC
  Filled 2015-06-23 (×2): qty 3

## 2015-06-23 MED ORDER — ADULT MULTIVITAMIN W/MINERALS CH
1.0000 | ORAL_TABLET | Freq: Every day | ORAL | Status: DC
  Administered 2015-06-24 – 2015-06-27 (×4): 1 via ORAL
  Filled 2015-06-23 (×4): qty 1

## 2015-06-23 MED ORDER — TEMAZEPAM 7.5 MG PO CAPS
15.0000 mg | ORAL_CAPSULE | Freq: Every evening | ORAL | Status: DC | PRN
  Administered 2015-06-23: 15 mg via ORAL
  Filled 2015-06-23: qty 2

## 2015-06-23 MED ORDER — METHYLPREDNISOLONE SODIUM SUCC 125 MG IJ SOLR
125.0000 mg | Freq: Once | INTRAMUSCULAR | Status: AC
  Administered 2015-06-23: 125 mg via INTRAVENOUS
  Filled 2015-06-23: qty 2

## 2015-06-23 MED ORDER — FERROUS SULFATE 325 (65 FE) MG PO TABS
650.0000 mg | ORAL_TABLET | Freq: Every day | ORAL | Status: DC
  Administered 2015-06-24 – 2015-06-26 (×3): 650 mg via ORAL
  Filled 2015-06-23 (×3): qty 2

## 2015-06-23 MED ORDER — LEVOFLOXACIN IN D5W 750 MG/150ML IV SOLN
750.0000 mg | Freq: Once | INTRAVENOUS | Status: AC
  Administered 2015-06-23: 750 mg via INTRAVENOUS
  Filled 2015-06-23: qty 150

## 2015-06-23 MED ORDER — GABAPENTIN 300 MG PO CAPS
300.0000 mg | ORAL_CAPSULE | Freq: Two times a day (BID) | ORAL | Status: DC
  Administered 2015-06-23 – 2015-06-24 (×2): 300 mg via ORAL
  Filled 2015-06-23 (×2): qty 1

## 2015-06-23 MED ORDER — ACETAMINOPHEN 650 MG RE SUPP: 650.0000 mg | Freq: Four times a day (QID) | RECTAL | Status: DC | PRN

## 2015-06-23 MED ORDER — METOPROLOL TARTRATE 50 MG PO TABS
50.0000 mg | ORAL_TABLET | Freq: Two times a day (BID) | ORAL | Status: DC
  Administered 2015-06-23 – 2015-06-25 (×4): 50 mg via ORAL
  Filled 2015-06-23 (×4): qty 1

## 2015-06-23 MED ORDER — QUETIAPINE FUMARATE 300 MG PO TABS
600.0000 mg | ORAL_TABLET | Freq: Every day | ORAL | Status: DC
  Administered 2015-06-23 – 2015-06-25 (×3): 600 mg via ORAL
  Filled 2015-06-23 (×2): qty 2
  Filled 2015-06-23 (×3): qty 6

## 2015-06-23 MED ORDER — ALBUTEROL SULFATE (2.5 MG/3ML) 0.083% IN NEBU
5.0000 mg | INHALATION_SOLUTION | Freq: Once | RESPIRATORY_TRACT | Status: AC
  Administered 2015-06-23: 5 mg via RESPIRATORY_TRACT
  Filled 2015-06-23: qty 6

## 2015-06-23 MED ORDER — OMEGA-3-ACID ETHYL ESTERS 1 G PO CAPS
1.0000 g | ORAL_CAPSULE | Freq: Every day | ORAL | Status: DC
  Administered 2015-06-23 – 2015-06-26 (×4): 1 g via ORAL
  Filled 2015-06-23 (×4): qty 1

## 2015-06-23 MED ORDER — LEVOFLOXACIN IN D5W 750 MG/150ML IV SOLN
750.0000 mg | INTRAVENOUS | Status: DC
  Filled 2015-06-23: qty 150

## 2015-06-23 NOTE — Progress Notes (Signed)
PHARMACIST - PHYSICIAN ORDER COMMUNICATION  CONCERNING: P&T Medication Policy on Herbal Medications  DESCRIPTION:  This patient's order for:  Glucosamine-chondroitin  has been noted.  This product(s) is classified as an "herbal" or natural product. Due to a lack of definitive safety studies or FDA approval, nonstandard manufacturing practices, plus the potential risk of unknown drug-drug interactions while on inpatient medications, the Pharmacy and Therapeutics Committee does not permit the use of "herbal" or natural products of this type within [place].   ACTION TAKEN: The pharmacy department is unable to verify this order at this time  Please reevaluate patient's clinical condition at discharge and address if the herbal or natural product(s) should be resumed at that time.    
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
## 2015-06-23 NOTE — ED Notes (Signed)
Provided patient with food tray and juice to drink.

## 2015-06-23 NOTE — ED Notes (Signed)
[place] with 2L O2 placed due to pt's O2 sat being 90%
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
## 2015-06-24 DIAGNOSIS — F319 Bipolar disorder, unspecified: Secondary | ICD-10-CM

## 2015-06-24 LAB — TROPONIN I
TROPONIN I: 0.07 ng/mL — AB (ref <0.031)
Troponin I: <0.03 ng/mL (ref <0.031)

## 2015-06-24 LAB — BASIC METABOLIC PANEL
ANION GAP: 7 (ref 5–15)
BUN: 8 mg/dL (ref 6–20)
CHLORIDE: 98 mmol/L — AB (ref 101–111)
CO2: 24 mmol/L (ref 22–32)
Calcium: 8 mg/dL — ABNORMAL LOW (ref 8.9–10.3)
Creatinine, Ser: 0.42 mg/dL — ABNORMAL LOW (ref 0.61–1.24)
GFR calc Af Amer: >60 mL/min (ref >60)
GFR calc non Af Amer: >60 mL/min (ref >60)
Glucose, Bld: 188 mg/dL — ABNORMAL HIGH (ref 65–99)
POTASSIUM: 3.8 mmol/L (ref 3.5–5.1)
SODIUM: 129 mmol/L — AB (ref 135–145)

## 2015-06-24 LAB — BRAIN NATRIURETIC PEPTIDE: B NATRIURETIC PEPTIDE 5: 812 pg/mL — AB (ref 0.0–100.0)

## 2015-06-24 LAB — CBC
HCT: 24.7 % — ABNORMAL LOW (ref 40.0–52.0)
HEMOGLOBIN: 8.2 g/dL — AB (ref 13.0–18.0)
MCH: 27.2 pg (ref 26.0–34.0)
MCHC: 33.2 g/dL (ref 32.0–36.0)
MCV: 81.9 fL (ref 80.0–100.0)
Platelets: 286 10*3/uL (ref 150–440)
RBC: 3.01 MIL/uL — AB (ref 4.40–5.90)
RDW: 18 % — ABNORMAL HIGH (ref 11.5–14.5)
WBC: 5 10*3/uL (ref 3.8–10.6)

## 2015-06-24 LAB — PROTIME-INR
INR: 1.31
PROTHROMBIN TIME: 16.4 s — AB (ref 11.4–15.0)

## 2015-06-24 LAB — SODIUM: SODIUM: 128 mmol/L — AB (ref 135–145)

## 2015-06-24 LAB — STREP PNEUMONIAE URINARY ANTIGEN: STREP PNEUMO URINARY ANTIGEN: NEGATIVE

## 2015-06-24 MED ORDER — LEVOFLOXACIN IN D5W 750 MG/150ML IV SOLN
750.0000 mg | INTRAVENOUS | Status: DC
  Administered 2015-06-25 – 2015-06-26 (×2): 750 mg via INTRAVENOUS
  Filled 2015-06-24 (×4): qty 150

## 2015-06-24 MED ORDER — FUROSEMIDE 20 MG PO TABS
20.0000 mg | ORAL_TABLET | Freq: Every day | ORAL | Status: DC
  Administered 2015-06-24: 17:00:00 20 mg via ORAL
  Filled 2015-06-24: qty 1

## 2015-06-24 MED ORDER — GABAPENTIN 300 MG PO CAPS
300.0000 mg | ORAL_CAPSULE | Freq: Two times a day (BID) | ORAL | Status: DC
  Administered 2015-06-25 – 2015-06-27 (×5): 300 mg via ORAL
  Filled 2015-06-24 (×4): qty 1

## 2015-06-24 MED ORDER — CARBAMAZEPINE 200 MG PO TABS
600.0000 mg | ORAL_TABLET | Freq: Two times a day (BID) | ORAL | Status: DC
  Administered 2015-06-24: 600 mg via ORAL
  Filled 2015-06-24: qty 3

## 2015-06-24 MED ORDER — ATORVASTATIN CALCIUM 20 MG PO TABS
20.0000 mg | ORAL_TABLET | Freq: Every day | ORAL | Status: DC
  Administered 2015-06-24 – 2015-06-26 (×3): 20 mg via ORAL
  Filled 2015-06-24 (×3): qty 1

## 2015-06-24 MED ORDER — SODIUM CHLORIDE 0.9 % IV SOLN
INTRAVENOUS | Status: AC
Start: 2015-06-24 — End: 2015-06-24
  Administered 2015-06-24: 22:00:00 via INTRAVENOUS

## 2015-06-24 MED ORDER — TEMAZEPAM 15 MG PO CAPS
15.0000 mg | ORAL_CAPSULE | Freq: Every day | ORAL | Status: DC
  Administered 2015-06-24 – 2015-06-26 (×3): 15 mg via ORAL
  Filled 2015-06-24 (×2): qty 2
  Filled 2015-06-24: qty 1

## 2015-06-24 MED ORDER — CARBAMAZEPINE 200 MG PO TABS
600.0000 mg | ORAL_TABLET | Freq: Two times a day (BID) | ORAL | Status: DC
  Administered 2015-06-25 – 2015-06-27 (×5): 600 mg via ORAL
  Filled 2015-06-24 (×5): qty 3

## 2015-06-24 MED ORDER — CARBAMAZEPINE 200 MG PO TABS: 600.0000 mg | ORAL_TABLET | Freq: Two times a day (BID) | ORAL | Status: DC

## 2015-06-24 MED ORDER — POTASSIUM CHLORIDE CRYS ER 20 MEQ PO TBCR
20.0000 meq | EXTENDED_RELEASE_TABLET | Freq: Every day | ORAL | Status: DC
  Administered 2015-06-24: 20 meq via ORAL
  Filled 2015-06-24: qty 1

## 2015-06-24 MED ORDER — POTASSIUM CHLORIDE IN NACL 20-0.9 MEQ/L-% IV SOLN
INTRAVENOUS | Status: DC
  Administered 2015-06-24: 10:00:00 via INTRAVENOUS
  Filled 2015-06-24: qty 1000

## 2015-06-24 MED ORDER — LEVOFLOXACIN 750 MG PO TABS
750.0000 mg | ORAL_TABLET | Freq: Every day | ORAL | Status: DC
  Administered 2015-06-24: 18:00:00 750 mg via ORAL
  Filled 2015-06-24: qty 1
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
## 2015-06-24 NOTE — Progress Notes (Addendum)
Pt refusing to take medications.  Requesting IV ABX now then IV solumedrol.  Explained that Levaquin was given po at 1800 and no IV dose due.  Explained that IV solumedrol is every 12 hours and I can only give it early at 2200.  Called and spoke with Dr Darvin Neighbours and he was transferred to the pt's phone.  Spoke with Dr Darvin Neighbours again and will follow orders. Dorna Bloom RN

## 2015-06-24 NOTE — Consult Note (Signed)
[facility] Face-to-Face Psychiatry Consult   Reason for Consult:  Consult for this 70 year old man with history of bipolar disorder. He requested psychiatry consult to make sure his medicines were correctly prescribed. Referring Physician:  Ether Griffins Patient Identification: Douglas Perkins. MRN:  [id number] Principal Diagnosis: Bipolar disorder 1 Diagnosis:   Patient Active Problem List   Diagnosis Date Noted  . Bipolar I disorder ([place]) [F31.9] 06/24/2015  . Bilateral pneumonia [J18.9] 06/23/2015  . 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 Bradycardia [R00.1] 02/08/2015  . 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 AF (amaurosis fugax) [G45.3] 09/16/2014  . Benign essential HTN [I10] 09/16/2014  . Chest pain [R07.9] 09/16/2014  . Skin cyst [L72.9] 08/06/2012  . Personal history of lymphoma [Z85.9] 08/06/2012  . Cancer ([place]) [C80.1]     Total Time spent with patient: 1 hour  Subjective:   Douglas Crispo. is a 70 y.o. male patient admitted with "I just want to make sure my medicines aren't done incorrectly".  HPI:  Patient interviewed. Chart reviewed. This 70 year old man is currently in the hospital for pneumonia and chest discomfort. He tells me that he thinks that his medicines were not correctly dispensed yesterday and it made him very anxious. He says he felt nervous and a little panicky. He didn't sleep very well last night. Patient thinks that his current mood while anxious is not particularly out of the ordinary. He says that emotionally he feels like he's been doing pretty well recently. Denies having any hallucinations or psychotic symptoms. Says that most nights he sleeps well with his medicines. Totally denies any suicidal thoughts. Denies having engaged in any dangerous or reckless behavior. He says that he sees Dr. Thurmond Butts regularly and has been doing so for well over 20 years. His current medicines are Tegretol 600 mg twice a day taken with  meals, gabapentin 300 mg twice a day taken with meals, Abilify 5 mg twice a day, Seroquel 600 mg at night and Restoril 15 mg at night.  Social history: Lives with his wife. 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 Seems to take a lot of pride in his family and be well functioning at baseline.  Medical history: Patient has a history of skin cancers and other cancers. History of high blood pressure. Currently in the hospital with pneumonia.  Substance abuse history: Denies any history of alcohol or drug abuse.  Past Psychiatric History: Patient has a history of bipolar disorder. He says he was first diagnosed correctly about 29 years ago when he had an episode of severe depression and that put him in a hospital in [place]. I was his only hospitalization. Since then he has largely had manias or hypomania's. Denies any suicidality denies any violence. He has been maintained on his current medicine for many years and has a great deal of faith and Dr. Thurmond Butts.  Risk to Self: Is patient at risk for suicide?: No Risk to Others:   Prior Inpatient Therapy:   Prior Outpatient Therapy:    Past Medical History:  Past Medical History  Diagnosis Date  . Hypertension   . Glaucoma   . Cancer (HCC)     merkel cell cancer  . Cancer [facility])     prostate  . TIA (transient ischemic attack)   . Bipolar 1 disorder [facility])     Past Surgical History  Procedure Laterality Date  . Prostate surgery    . Cholecystectomy    . Prostatectomy    .  Leg surgery Left     distal  . Skin cancer excision  [id number]    Merkle Cell Carcinoma   Family History: History reviewed. No pertinent family history. Family Psychiatric  History: Patient states that bipolar disorder runs in his family several other people of Douglas Perkins as well. Social History:  History  Alcohol Use No     History  Drug Use No    Social History   Social History  . Marital Status: Married    Spouse Name: N/A  . Number of Children:  N/A  . Years of Education: N/A   Social History Main Topics  . Smoking status: Former Research scientist (life sciences)  . Smokeless tobacco: None  . Alcohol Use: No  . Drug Use: No  . Sexual Activity: Not Asked   Other Topics Concern  . None   Social History Narrative   Additional Social History:    Allergies:   Allergies  Allergen Reactions  . Other Hives, Shortness Of Breath and Other (See Comments)    Pt states that he is allergic to unwashed blood products.    . Feldene [Piroxicam] Hives  . Sulfa Antibiotics Hives    Labs:  Results for orders placed or performed during the hospital encounter of 06/23/15 (from the past 48 hour(s))  Basic metabolic panel     Status: Abnormal   Collection Time: 06/23/15  3:47 PM  Result Value Ref Range   Sodium 132 (L) 135 - 145 mmol/L   Potassium 4.4 3.5 - 5.1 mmol/L   Chloride 98 (L) 101 - 111 mmol/L   CO2 25 22 - 32 mmol/L   Glucose, Bld 134 (H) 65 - 99 mg/dL   BUN 10 6 - 20 mg/dL   Creatinine, Ser 0.51 (L) 0.61 - 1.24 mg/dL   Calcium 8.5 (L) 8.9 - 10.3 mg/dL   GFR calc non Af Amer >60 >60 mL/min   GFR calc Af Amer >60 >60 mL/min    Comment: (NOTE) The eGFR has been calculated using the CKD EPI equation. This calculation has not been validated in all clinical situations. eGFR's persistently <60 mL/min signify possible Chronic Kidney Disease.    Anion gap 9 5 - 15  Troponin I     Status: Abnormal   Collection Time: 06/23/15  3:47 PM  Result Value Ref Range   Troponin I 0.05 (H) <0.031 ng/mL    Comment: READ BACK AND VERIFIED WITH MARY NEEDHAM AT 0277 06/23/15 MLZ        PERSISTENTLY INCREASED TROPONIN VALUES IN THE RANGE OF 0.04-0.49 ng/mL CAN BE SEEN IN:       -UNSTABLE ANGINA       -CONGESTIVE HEART FAILURE       -MYOCARDITIS       -CHEST TRAUMA       -ARRYHTHMIAS       -LATE PRESENTING MYOCARDIAL INFARCTION       -COPD   CLINICAL FOLLOW-UP RECOMMENDED.   CBC     Status: Abnormal   Collection Time: 06/23/15  3:47 PM  Result Value Ref  Range   WBC 8.4 3.8 - 10.6 K/uL   RBC 3.27 (L) 4.40 - 5.90 MIL/uL   Hemoglobin 8.9 (L) 13.0 - 18.0 g/dL   HCT 27.2 (L) 40.0 - 52.0 %   MCV 83.1 80.0 - 100.0 fL   MCH 27.1 26.0 - 34.0 pg   MCHC 32.6 32.0 - 36.0 g/dL   RDW 17.8 (H) 11.5 - 14.5 %   Platelets 347 150 -  440 K/uL  Rapid Influenza A&B Antigens ([facility] only)     Status: None   Collection Time: 06/23/15  7:36 PM  Result Value Ref Range   Influenza A [facility]) NOT DETECTED    Influenza B [facility]) NOT DETECTED   Troponin I (q 6hr x 3)     Status: Abnormal   Collection Time: 06/23/15  8:55 PM  Result Value Ref Range   Troponin I 0.06 (H) <0.031 ng/mL    Comment: PREVIOUS RESULT CALLED TO MARY NEEDHAM ON 06/23/15 AT 1644 MLZ/SRC        PERSISTENTLY INCREASED TROPONIN VALUES IN THE RANGE OF 0.04-0.49 ng/mL CAN BE SEEN IN:       -UNSTABLE ANGINA       -CONGESTIVE HEART FAILURE       -MYOCARDITIS       -CHEST TRAUMA       -ARRYHTHMIAS       -LATE PRESENTING MYOCARDIAL INFARCTION       -COPD   CLINICAL FOLLOW-UP RECOMMENDED.   Strep pneumoniae urinary antigen     Status: None   Collection Time: 06/23/15 11:55 PM  Result Value Ref Range   Strep Pneumo Urinary Antigen NEGATIVE NEGATIVE    Comment:        Infection due to S. pneumoniae cannot be absolutely ruled out since the antigen present may be below the detection limit of the test. Performed at [facility]   Troponin I (q 6hr x 3)     Status: Abnormal   Collection Time: 06/24/15  3:44 AM  Result Value Ref Range   Troponin I 0.07 (H) <0.031 ng/mL    Comment: PREVIOUS RESULT CALLED TO MARY NEEDHAM ON 06/23/15 AT 1644 MLZ/SRC        PERSISTENTLY INCREASED TROPONIN VALUES IN THE RANGE OF 0.04-0.49 ng/mL CAN BE SEEN IN:       -UNSTABLE ANGINA       -CONGESTIVE HEART FAILURE       -MYOCARDITIS       -CHEST TRAUMA       -ARRYHTHMIAS       -LATE PRESENTING MYOCARDIAL INFARCTION       -COPD   CLINICAL FOLLOW-UP RECOMMENDED.   CBC     Status: Abnormal    Collection Time: 06/24/15  3:44 AM  Result Value Ref Range   WBC 5.0 3.8 - 10.6 K/uL   RBC 3.01 (L) 4.40 - 5.90 MIL/uL   Hemoglobin 8.2 (L) 13.0 - 18.0 g/dL   HCT 24.7 (L) 40.0 - 52.0 %   MCV 81.9 80.0 - 100.0 fL   MCH 27.2 26.0 - 34.0 pg   MCHC 33.2 32.0 - 36.0 g/dL   RDW 18.0 (H) 11.5 - 14.5 %   Platelets 286 150 - 440 K/uL  Basic metabolic panel     Status: Abnormal   Collection Time: 06/24/15  3:44 AM  Result Value Ref Range   Sodium 129 (L) 135 - 145 mmol/L   Potassium 3.8 3.5 - 5.1 mmol/L   Chloride 98 (L) 101 - 111 mmol/L   CO2 24 22 - 32 mmol/L   Glucose, Bld 188 (H) 65 - 99 mg/dL   BUN 8 6 - 20 mg/dL   Creatinine, Ser 0.42 (L) 0.61 - 1.24 mg/dL   Calcium 8.0 (L) 8.9 - 10.3 mg/dL   GFR calc non Af Amer >60 >60 mL/min   GFR calc Af Amer >60 >60 mL/min    Comment: (NOTE) The eGFR has been calculated  using the CKD EPI equation. This calculation has not been validated in all clinical situations. eGFR's persistently <60 mL/min signify possible Chronic Kidney Disease.    Anion gap 7 5 - 15  Protime-INR     Status: Abnormal   Collection Time: 06/24/15  3:44 AM  Result Value Ref Range   Prothrombin Time 16.4 (H) 11.4 - 15.0 seconds   INR 1.31   Troponin I (q 6hr x 3)     Status: None   Collection Time: 06/24/15  8:33 AM  Result Value Ref Range   Troponin I <0.03 <0.031 ng/mL    Comment:        NO INDICATION OF MYOCARDIAL INJURY.   Brain natriuretic peptide     Status: Abnormal   Collection Time: 06/24/15  2:53 PM  Result Value Ref Range   B Natriuretic Peptide 812.0 (H) 0.0 - 100.0 pg/mL  Sodium     Status: Abnormal   Collection Time: 06/24/15  2:53 PM  Result Value Ref Range   Sodium 128 (L) 135 - 145 mmol/L    Current Facility-Administered Medications  Medication Dose Route Frequency Provider Last Rate Last Dose  . acetaminophen (TYLENOL) tablet 650 mg  650 mg Oral Q6H PRN Nicholes Mango, MD   650 mg at 06/24/15 1014   Or  . acetaminophen (TYLENOL) suppository  650 mg  650 mg Rectal Q6H PRN Aruna Gouru, MD      . albuterol (PROVENTIL) (2.5 MG/3ML) 0.083% nebulizer solution 2.5 mg  2.5 mg Nebulization Q4H PRN Aruna Gouru, MD      . amLODipine (NORVASC) tablet 5 mg  5 mg Oral Daily Nicholes Mango, MD   5 mg at 06/24/15 1014  . ARIPiprazole (ABILIFY) tablet 5 mg  5 mg Oral BID Nicholes Mango, MD   5 mg at 06/24/15 1014  . atorvastatin (LIPITOR) tablet 20 mg  20 mg Oral q1800 Nicholes Mango, MD   20 mg at 06/24/15 1726  . [START ON 06/25/2015] carbamazepine (TEGRETOL) tablet 600 mg  600 mg Oral BID AC John T Clapacs, MD      . docusate sodium (COLACE) capsule 100 mg  100 mg Oral BID Nicholes Mango, MD   100 mg at 06/24/15 1014  . enoxaparin (LOVENOX) injection 40 mg  40 mg Subcutaneous Q24H Aruna Gouru, MD      . ferrous sulfate tablet 650 mg  650 mg Oral Q breakfast Aruna Gouru, MD      . furosemide (LASIX) tablet 20 mg  20 mg Oral Daily Theodoro Grist, MD   20 mg at 06/24/15 1725  . [START ON 06/25/2015] gabapentin (NEURONTIN) capsule 300 mg  300 mg Oral BID AC John T Clapacs, MD      . latanoprost (XALATAN) 0.005 % ophthalmic solution 1 drop  1 drop Both Eyes QHS Nicholes Mango, MD   1 drop at 06/24/15 1016  . levofloxacin (LEVAQUIN) tablet 750 mg  750 mg Oral Daily Theodoro Grist, MD   750 mg at 06/24/15 1800  . lisinopril (PRINIVIL,ZESTRIL) tablet 40 mg  40 mg Oral QPM Nicholes Mango, MD   40 mg at 06/24/15 1726  . methylPREDNISolone sodium succinate (SOLU-MEDROL) 40 mg/mL injection 40 mg  40 mg Intravenous Q12H Nicholes Mango, MD   40 mg at 06/24/15 1131  . metoprolol (LOPRESSOR) tablet 50 mg  50 mg Oral BID Nicholes Mango, MD   50 mg at 06/24/15 1014  . multivitamin with minerals tablet 1 tablet  1 tablet Oral Daily Aruna  Gouru, MD   1 tablet at 06/24/15 1014  . omega-3 acid ethyl esters (LOVAZA) capsule 1 g  1 g Oral Daily Nicholes Mango, MD   1 g at 06/23/15 2347  . oxyCODONE (Oxy IR/ROXICODONE) immediate release tablet 5 mg  5 mg Oral Q4H PRN Nicholes Mango, MD      . pantoprazole  (PROTONIX) EC tablet 40 mg  40 mg Oral BID Nicholes Mango, MD   40 mg at 06/24/15 1135  . potassium chloride SA (K-DUR,KLOR-CON) CR tablet 20 mEq  20 mEq Oral Daily Theodoro Grist, MD   20 mEq at 06/24/15 1726  . QUEtiapine (SEROQUEL) tablet 600 mg  600 mg Oral QHS Nicholes Mango, MD   600 mg at 06/23/15 2348  . sodium chloride flush (NS) 0.9 % injection 3 mL  3 mL Intravenous Q12H Nicholes Mango, MD   3 mL at 06/24/15 1020  . temazepam (RESTORIL) capsule 15 mg  15 mg Oral QHS John T Clapacs, MD      . timolol (BETIMOL) 0.5 % ophthalmic solution 1 drop  1 drop Right Eye Daily Nicholes Mango, MD   1 drop at 06/24/15 1149  . traZODone (DESYREL) tablet 25 mg  25 mg Oral QHS PRN Nicholes Mango, MD        Musculoskeletal: Strength & Muscle Tone: within normal limits Gait & Station: normal Patient leans: N/A  Psychiatric Specialty Exam: Review of Systems  Constitutional: Negative.   HENT: Negative.   Eyes: Negative.   Respiratory: Positive for cough and shortness of breath.   Cardiovascular: Negative.   Gastrointestinal: Negative.   Musculoskeletal: Negative.   Skin: Negative.   Neurological: Negative.   Psychiatric/Behavioral: Negative for depression, suicidal ideas, hallucinations, memory loss and substance abuse. The patient is nervous/anxious and has insomnia.     Blood pressure 176/109, pulse 82, temperature 97.8 F (36.6 C), temperature source Oral, resp. rate 20, height 6' (1.829 m), weight 92.352 kg (203 lb 9.6 oz), SpO2 95 %.Body mass index is 27.61 kg/(m^2).  General Appearance: Casual  Eye Contact::  Good  Speech:  Pressured  Volume:  Increased  Mood:  Euphoric  Affect:  Labile  Thought Process:  Tangential  Orientation:  Full (Time, Place, and Person)  Thought Content:  Negative  Suicidal Thoughts:  No  Homicidal Thoughts:  No  Memory:  Immediate;   Good Recent;   Fair Remote;   Good  Judgement:  Fair  Insight:  Fair  Psychomotor Activity:  Normal  Concentration:  Fair  Recall:   [employer] of Knowledge:Fair  Language: Fair  Akathisia:  No  Handed:  Right  AIMS (if indicated):     Assets:  Communication Skills Desire for Improvement Financial Resources/Insurance Housing Resilience Social Support Talents/Skills  ADL's:  Intact  Cognition: WNL  Sleep:      Treatment Plan Summary: Daily contact with patient to assess and evaluate symptoms and progress in treatment, Medication management and Plan 70 year old male with a history of bipolar disorder. His clinical presentation to me is a little bit hypomanic. His speech is pressured his thoughts jump around a lot he is a slim bit grandiose on the other hand there is no sign of psychosis no sign of dangerousness. He is completely cooperative and appropriate as far as his treatment here. He is mainly concerned that I get his medicines timed correctly the way he takes them at home. I assured him that I will make sure that those adjustments have been made. We  will continue his medicines just as quoted above. I will check in with him tomorrow make sure he is doing well. Supportive counseling and encouragement. Need for hospitalization.  Disposition: Patient does not meet criteria for psychiatric inpatient admission. Supportive therapy provided about ongoing stressors.  Alethia Berthold, MD 06/24/2015 7:42 PM

## 2015-06-25 ENCOUNTER — Inpatient Hospital Stay: Payer: Medicare Other

## 2015-06-25 LAB — BASIC METABOLIC PANEL
ANION GAP: 7 (ref 5–15)
BUN: 10 mg/dL (ref 6–20)
CALCIUM: 7.9 mg/dL — AB (ref 8.9–10.3)
CO2: 24 mmol/L (ref 22–32)
Chloride: 97 mmol/L — ABNORMAL LOW (ref 101–111)
Creatinine, Ser: 0.36 mg/dL — ABNORMAL LOW (ref 0.61–1.24)
GFR calc Af Amer: >60 mL/min (ref >60)
GLUCOSE: 147 mg/dL — AB (ref 65–99)
POTASSIUM: 4.6 mmol/L (ref 3.5–5.1)
SODIUM: 128 mmol/L — AB (ref 135–145)

## 2015-06-25 LAB — TROPONIN I
Troponin I: 0.18 ng/mL — ABNORMAL HIGH (ref <0.031)
Troponin I: 0.16 ng/mL — ABNORMAL HIGH (ref <0.031)
Troponin I: 0.15 ng/mL — ABNORMAL HIGH (ref <0.031)

## 2015-06-25 LAB — MYCOPLASMA PNEUMONIAE ANTIBODY, IGM

## 2015-06-25 LAB — TSH: TSH: 0.253 u[IU]/mL — AB (ref 0.350–4.500)

## 2015-06-25 LAB — MAGNESIUM: MAGNESIUM: 1.9 mg/dL (ref 1.7–2.4)

## 2015-06-25 LAB — GLUCOSE, CAPILLARY: GLUCOSE-CAPILLARY: 128 mg/dL — AB (ref 65–99)

## 2015-06-25 LAB — OSMOLALITY, URINE: Osmolality, Ur: 574 mOsm/kg (ref 300–900)

## 2015-06-25 MED ORDER — NITROGLYCERIN 2 % TD OINT
0.5000 [in_us] | TOPICAL_OINTMENT | Freq: Four times a day (QID) | TRANSDERMAL | Status: DC
  Administered 2015-06-25 – 2015-06-27 (×8): 0.5 [in_us] via TOPICAL
  Filled 2015-06-25 (×9): qty 1
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
## 2015-06-25 NOTE — Care Management Important Message (Signed)
Important Message  Patient Details  Name: Douglas Perkins. MRN: [id number] Date of Birth: [birth year]   Medicare Important Message Given:  Yes    [name] 06/25/2015, 1:36 PM

## 2015-06-25 NOTE — Progress Notes (Signed)
Pt called to get up to go to the bathroom.  Pt fainted in bed and NA called out.  Ran into room and found pt not breathing and no pulse.  Placed pt flat, removed pillow and started compressions.  O2 turned up to 10 L [place].  Did two sets of 15 compressions then pt had a large gasp and opened his eyes.  Code was called during this time and the monitor was being set up on the pt when he recovered.  Pt able to talk in few word sentences.  Per CCU telemetry monitor- pt bradicardic then asystole  at 6:07 AM. Pt recovered and currently with HR 70 NSR and SAO2 of 97% on 3 L.  Dorna Bloom RN
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
## 2015-06-25 NOTE — Consult Note (Signed)
[facility] Face-to-Face Psychiatry Consult   Reason for Consult:  Consult for this 70 year old man with history of bipolar disorder. He requested psychiatry consult to make sure his medicines were correctly prescribed. Referring Physician:  Ether Griffins Patient Identification: Douglas Perkins. MRN:  [id number] Principal Diagnosis: Bipolar disorder 1 Diagnosis:   Patient Active Problem List   Diagnosis Date Noted  . Bipolar I disorder ([place]) [F31.9] 06/24/2015  . Bilateral pneumonia [J18.9] 06/23/2015  . 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 Bradycardia [R00.1] 02/08/2015  . 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 AF (amaurosis fugax) [G45.3] 09/16/2014  . Benign essential HTN [I10] 09/16/2014  . Chest pain [R07.9] 09/16/2014  . Skin cyst [L72.9] 08/06/2012  . Personal history of lymphoma [Z85.9] 08/06/2012  . Cancer ([place]) [C80.1]     Total Time spent with patient: 1 hour  Subjective:   Douglas Perkins. is a 70 y.o. male patient admitted with "I just want to make sure my medicines aren't done incorrectly".  Follow-up note as of Friday the 24th. Patient reports he is feeling much better. He said he slept well last night. His mood appears to be much more calm. On interview today he is much less pressured in his speech and less tangential. Mood lability is decreased. Appears to be tolerating medicine well.  HPI:  Patient interviewed. Chart reviewed. This 70 year old man is currently in the hospital for pneumonia and chest discomfort. He tells me that he thinks that his medicines were not correctly dispensed yesterday and it made him very anxious. He says he felt nervous and a little panicky. He didn't sleep very well last night. Patient thinks that his current mood while anxious is not particularly out of the ordinary. He says that emotionally he feels like he's been doing pretty well recently. Denies having any hallucinations or psychotic symptoms. Says  that most nights he sleeps well with his medicines. Totally denies any suicidal thoughts. Denies having engaged in any dangerous or reckless behavior. He says that he sees Dr. Thurmond Butts regularly and has been doing so for well over 20 years. His current medicines are Tegretol 600 mg twice a day taken with meals, gabapentin 300 mg twice a day taken with meals, Abilify 5 mg twice a day, Seroquel 600 mg at night and Restoril 15 mg at night.  Social history: Lives with his wife. 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 Seems to take a lot of pride in his family and be well functioning at baseline.  Medical history: Patient has a history of skin cancers and other cancers. History of high blood pressure. Currently in the hospital with pneumonia.  Substance abuse history: Denies any history of alcohol or drug abuse.  Past Psychiatric History: Patient has a history of bipolar disorder. He says he was first diagnosed correctly about 29 years ago when he had an episode of severe depression and that put him in a hospital in [place]. I was his only hospitalization. Since then he has largely had manias or hypomania's. Denies any suicidality denies any violence. He has been maintained on his current medicine for many years and has a great deal of faith and Dr. Thurmond Butts.  Risk to Self: Is patient at risk for suicide?: No Risk to Others:   Prior Inpatient Therapy:   Prior Outpatient Therapy:    Past Medical History:  Past Medical History  Diagnosis Date  . Hypertension   . Glaucoma   . Cancer ([place])  merkel cell cancer  . Cancer [facility])     prostate  . TIA (transient ischemic attack)   . Bipolar 1 disorder [facility])     Past Surgical History  Procedure Laterality Date  . Prostate surgery    . Cholecystectomy    . Prostatectomy    . Leg surgery Left     distal  . Skin cancer excision  [id number]    Merkle Cell Carcinoma   Family History: History reviewed. No pertinent family history. Family  Psychiatric  History: Patient states that bipolar disorder runs in his family several other people of [place] as well. Social History:  History  Alcohol Use No     History  Drug Use No    Social History   Social History  . Marital Status: Married    Spouse Name: N/A  . Number of Children: N/A  . Years of Education: N/A   Social History Main Topics  . Smoking status: Former Research scientist (life sciences)  . Smokeless tobacco: None  . Alcohol Use: No  . Drug Use: No  . Sexual Activity: Not Asked   Other Topics Concern  . None   Social History Narrative   Additional Social History:    Allergies:   Allergies  Allergen Reactions  . Other Hives, Shortness Of Breath and Other (See Comments)    Pt states that he is allergic to unwashed blood products.    . Feldene [Piroxicam] Hives  . Sulfa Antibiotics Hives    Labs:  Results for orders placed or performed during the hospital encounter of 06/23/15 (from the past 48 hour(s))  Basic metabolic panel     Status: Abnormal   Collection Time: 06/23/15  3:47 PM  Result Value Ref Range   Sodium 132 (L) 135 - 145 mmol/L   Potassium 4.4 3.5 - 5.1 mmol/L   Chloride 98 (L) 101 - 111 mmol/L   CO2 25 22 - 32 mmol/L   Glucose, Bld 134 (H) 65 - 99 mg/dL   BUN 10 6 - 20 mg/dL   Creatinine, Ser 0.51 (L) 0.61 - 1.24 mg/dL   Calcium 8.5 (L) 8.9 - 10.3 mg/dL   GFR calc non Af Amer >60 >60 mL/min   GFR calc Af Amer >60 >60 mL/min    Comment: (NOTE) The eGFR has been calculated using the CKD EPI equation. This calculation has not been validated in all clinical situations. eGFR's persistently <60 mL/min signify possible Chronic Kidney Disease.    Anion gap 9 5 - 15  Troponin I     Status: Abnormal   Collection Time: 06/23/15  3:47 PM  Result Value Ref Range   Troponin I 0.05 (H) <0.031 ng/mL    Comment: READ BACK AND VERIFIED WITH MARY NEEDHAM AT 5093 06/23/15 MLZ        PERSISTENTLY INCREASED TROPONIN VALUES IN THE RANGE OF 0.04-0.49 ng/mL CAN BE SEEN  IN:       -UNSTABLE ANGINA       -CONGESTIVE HEART FAILURE       -MYOCARDITIS       -CHEST TRAUMA       -ARRYHTHMIAS       -LATE PRESENTING MYOCARDIAL INFARCTION       -COPD   CLINICAL FOLLOW-UP RECOMMENDED.   CBC     Status: Abnormal   Collection Time: 06/23/15  3:47 PM  Result Value Ref Range   WBC 8.4 3.8 - 10.6 K/uL   RBC 3.27 (L) 4.40 - 5.90 MIL/uL   Hemoglobin  8.9 (L) 13.0 - 18.0 g/dL   HCT 27.2 (L) 40.0 - 52.0 %   MCV 83.1 80.0 - 100.0 fL   MCH 27.1 26.0 - 34.0 pg   MCHC 32.6 32.0 - 36.0 g/dL   RDW 17.8 (H) 11.5 - 14.5 %   Platelets 347 150 - 440 K/uL  Rapid Influenza A&B Antigens ([facility] only)     Status: None   Collection Time: 06/23/15  7:36 PM  Result Value Ref Range   Influenza A [facility]) NOT DETECTED    Influenza B [facility]) NOT DETECTED   Troponin I (q 6hr x 3)     Status: Abnormal   Collection Time: 06/23/15  8:55 PM  Result Value Ref Range   Troponin I 0.06 (H) <0.031 ng/mL    Comment: PREVIOUS RESULT CALLED TO MARY NEEDHAM ON 06/23/15 AT 1644 MLZ/SRC        PERSISTENTLY INCREASED TROPONIN VALUES IN THE RANGE OF 0.04-0.49 ng/mL CAN BE SEEN IN:       -UNSTABLE ANGINA       -CONGESTIVE HEART FAILURE       -MYOCARDITIS       -CHEST TRAUMA       -ARRYHTHMIAS       -LATE PRESENTING MYOCARDIAL INFARCTION       -COPD   CLINICAL FOLLOW-UP RECOMMENDED.   Strep pneumoniae urinary antigen     Status: None   Collection Time: 06/23/15 11:55 PM  Result Value Ref Range   Strep Pneumo Urinary Antigen NEGATIVE NEGATIVE    Comment:        Infection due to S. pneumoniae cannot be absolutely ruled out since the antigen present may be below the detection limit of the test. Performed at [facility]   Troponin I (q 6hr x 3)     Status: Abnormal   Collection Time: 06/24/15  3:44 AM  Result Value Ref Range   Troponin I 0.07 (H) <0.031 ng/mL    Comment: PREVIOUS RESULT CALLED TO MARY NEEDHAM ON 06/23/15 AT 1644 MLZ/SRC        PERSISTENTLY INCREASED  TROPONIN VALUES IN THE RANGE OF 0.04-0.49 ng/mL CAN BE SEEN IN:       -UNSTABLE ANGINA       -CONGESTIVE HEART FAILURE       -MYOCARDITIS       -CHEST TRAUMA       -ARRYHTHMIAS       -LATE PRESENTING MYOCARDIAL INFARCTION       -COPD   CLINICAL FOLLOW-UP RECOMMENDED.   CBC     Status: Abnormal   Collection Time: 06/24/15  3:44 AM  Result Value Ref Range   WBC 5.0 3.8 - 10.6 K/uL   RBC 3.01 (L) 4.40 - 5.90 MIL/uL   Hemoglobin 8.2 (L) 13.0 - 18.0 g/dL   HCT 24.7 (L) 40.0 - 52.0 %   MCV 81.9 80.0 - 100.0 fL   MCH 27.2 26.0 - 34.0 pg   MCHC 33.2 32.0 - 36.0 g/dL   RDW 18.0 (H) 11.5 - 14.5 %   Platelets 286 150 - 440 K/uL  Basic metabolic panel     Status: Abnormal   Collection Time: 06/24/15  3:44 AM  Result Value Ref Range   Sodium 129 (L) 135 - 145 mmol/L   Potassium 3.8 3.5 - 5.1 mmol/L   Chloride 98 (L) 101 - 111 mmol/L   CO2 24 22 - 32 mmol/L   Glucose, Bld 188 (H) 65 - 99 mg/dL  BUN 8 6 - 20 mg/dL   Creatinine, Ser 0.42 (L) 0.61 - 1.24 mg/dL   Calcium 8.0 (L) 8.9 - 10.3 mg/dL   GFR calc non Af Amer >60 >60 mL/min   GFR calc Af Amer >60 >60 mL/min    Comment: (NOTE) The eGFR has been calculated using the CKD EPI equation. This calculation has not been validated in all clinical situations. eGFR's persistently <60 mL/min signify possible Chronic Kidney Disease.    Anion gap 7 5 - 15  Protime-INR     Status: Abnormal   Collection Time: 06/24/15  3:44 AM  Result Value Ref Range   Prothrombin Time 16.4 (H) 11.4 - 15.0 seconds   INR 1.31   Troponin I (q 6hr x 3)     Status: None   Collection Time: 06/24/15  8:33 AM  Result Value Ref Range   Troponin I <0.03 <0.031 ng/mL    Comment:        NO INDICATION OF MYOCARDIAL INJURY.   Brain natriuretic peptide     Status: Abnormal   Collection Time: 06/24/15  2:53 PM  Result Value Ref Range   B Natriuretic Peptide 812.0 (H) 0.0 - 100.0 pg/mL  Sodium     Status: Abnormal   Collection Time: 06/24/15  2:53 PM  Result  Value Ref Range   Sodium 128 (L) 135 - 145 mmol/L  Basic metabolic panel     Status: Abnormal   Collection Time: 06/25/15  5:20 AM  Result Value Ref Range   Sodium 128 (L) 135 - 145 mmol/L   Potassium 4.6 3.5 - 5.1 mmol/L   Chloride 97 (L) 101 - 111 mmol/L   CO2 24 22 - 32 mmol/L   Glucose, Bld 147 (H) 65 - 99 mg/dL   BUN 10 6 - 20 mg/dL   Creatinine, Ser 0.36 (L) 0.61 - 1.24 mg/dL   Calcium 7.9 (L) 8.9 - 10.3 mg/dL   GFR calc non Af Amer >60 >60 mL/min   GFR calc Af Amer >60 >60 mL/min    Comment: (NOTE) The eGFR has been calculated using the CKD EPI equation. This calculation has not been validated in all clinical situations. eGFR's persistently <60 mL/min signify possible Chronic Kidney Disease.    Anion gap 7 5 - 15  Glucose, capillary     Status: Abnormal   Collection Time: 06/25/15  6:12 AM  Result Value Ref Range   Glucose-Capillary 128 (H) 65 - 99 mg/dL   Comment 1 Notify RN   Troponin I     Status: Abnormal   Collection Time: 06/25/15  7:05 AM  Result Value Ref Range   Troponin I 0.18 (H) <0.031 ng/mL    Comment: READ BACK AND VERIFIED WITH ROBIN THOAS 06/25/15 1026 SGD        PERSISTENTLY INCREASED TROPONIN VALUES IN THE RANGE OF 0.04-0.49 ng/mL CAN BE SEEN IN:       -UNSTABLE ANGINA       -CONGESTIVE HEART FAILURE       -MYOCARDITIS       -CHEST TRAUMA       -ARRYHTHMIAS       -LATE PRESENTING MYOCARDIAL INFARCTION       -COPD   CLINICAL FOLLOW-UP RECOMMENDED.   Osmolality, urine     Status: None   Collection Time: 06/25/15  8:21 AM  Result Value Ref Range   Osmolality, Ur 574 300 - 900 mOsm/kg  Troponin I     Status: Abnormal  Collection Time: 06/25/15 10:36 AM  Result Value Ref Range   Troponin I 0.16 (H) <0.031 ng/mL    Comment: PREVIOUS RESULT CALLED TO ROBIN THOMAS AT 1026 ON 06/25/15 BY SGD.Marland KitchenMarland KitchenMidland        PERSISTENTLY INCREASED TROPONIN VALUES IN THE RANGE OF 0.04-0.49 ng/mL CAN BE SEEN IN:       -UNSTABLE ANGINA       -CONGESTIVE HEART  FAILURE       -MYOCARDITIS       -CHEST TRAUMA       -ARRYHTHMIAS       -LATE PRESENTING MYOCARDIAL INFARCTION       -COPD   CLINICAL FOLLOW-UP RECOMMENDED.     Current Facility-Administered Medications  Medication Dose Route Frequency Provider Last Rate Last Dose  . acetaminophen (TYLENOL) tablet 650 mg  650 mg Oral Q6H PRN Nicholes Mango, MD   650 mg at 06/24/15 2226   Or  . acetaminophen (TYLENOL) suppository 650 mg  650 mg Rectal Q6H PRN Aruna Gouru, MD      . albuterol (PROVENTIL) (2.5 MG/3ML) 0.083% nebulizer solution 2.5 mg  2.5 mg Nebulization Q4H PRN Aruna Gouru, MD      . amLODipine (NORVASC) tablet 5 mg  5 mg Oral Daily Nicholes Mango, MD   5 mg at 06/25/15 1008  . ARIPiprazole (ABILIFY) tablet 5 mg  5 mg Oral BID Nicholes Mango, MD   5 mg at 06/25/15 1008  . atorvastatin (LIPITOR) tablet 20 mg  20 mg Oral q1800 Nicholes Mango, MD   20 mg at 06/24/15 1726  . carbamazepine (TEGRETOL) tablet 600 mg  600 mg Oral BID AC Gonzella Lex, MD   600 mg at 06/25/15 1008  . docusate sodium (COLACE) capsule 100 mg  100 mg Oral BID Nicholes Mango, MD   100 mg at 06/25/15 1008  . enoxaparin (LOVENOX) injection 40 mg  40 mg Subcutaneous Q24H Nicholes Mango, MD   40 mg at 06/24/15 2236  . ferrous sulfate tablet 650 mg  650 mg Oral Q breakfast Nicholes Mango, MD   650 mg at 06/24/15 2236  . gabapentin (NEURONTIN) capsule 300 mg  300 mg Oral BID AC Gonzella Lex, MD   300 mg at 06/25/15 1031  . latanoprost (XALATAN) 0.005 % ophthalmic solution 1 drop  1 drop Both Eyes QHS Nicholes Mango, MD   1 drop at 06/24/15 2237  . levofloxacin (LEVAQUIN) IVPB 750 mg  750 mg Intravenous Q24H Srikar Sudini, MD      . lisinopril (PRINIVIL,ZESTRIL) tablet 40 mg  40 mg Oral QPM Nicholes Mango, MD   40 mg at 06/24/15 1726  . methylPREDNISolone sodium succinate (SOLU-MEDROL) 40 mg/mL injection 40 mg  40 mg Intravenous Q12H Nicholes Mango, MD   40 mg at 06/25/15 1406  . metoprolol (LOPRESSOR) tablet 50 mg  50 mg Oral BID Nicholes Mango, MD   50  mg at 06/25/15 1008  . multivitamin with minerals tablet 1 tablet  1 tablet Oral Daily Nicholes Mango, MD   1 tablet at 06/25/15 1008  . nitroGLYCERIN (NITROGLYN) 2 % ointment 0.5 inch  0.5 inch Topical 4 times per day Theodoro Grist, MD   0.5 inch at 06/25/15 1405  . omega-3 acid ethyl esters (LOVAZA) capsule 1 g  1 g Oral Daily Nicholes Mango, MD   1 g at 06/24/15 2238  . oxyCODONE (Oxy IR/ROXICODONE) immediate release tablet 5 mg  5 mg Oral Q4H PRN Nicholes Mango, MD      .  pantoprazole (PROTONIX) EC tablet 40 mg  40 mg Oral BID Nicholes Mango, MD   40 mg at 06/25/15 1008  . QUEtiapine (SEROQUEL) tablet 600 mg  600 mg Oral QHS Nicholes Mango, MD   600 mg at 06/24/15 2226  . sodium chloride flush (NS) 0.9 % injection 3 mL  3 mL Intravenous Q12H Nicholes Mango, MD   3 mL at 06/25/15 1009  . temazepam (RESTORIL) capsule 15 mg  15 mg Oral QHS Gonzella Lex, MD   15 mg at 06/24/15 2226  . timolol (BETIMOL) 0.5 % ophthalmic solution 1 drop  1 drop Right Eye Daily Nicholes Mango, MD   1 drop at 06/25/15 1000  . traZODone (DESYREL) tablet 25 mg  25 mg Oral QHS PRN Nicholes Mango, MD        Musculoskeletal: Strength & Muscle Tone: within normal limits Gait & Station: normal Patient leans: N/A  Psychiatric Specialty Exam: Review of Systems  Constitutional: Negative.   HENT: Negative.   Eyes: Negative.   Respiratory: Positive for cough and shortness of breath.   Cardiovascular: Negative.   Gastrointestinal: Negative.   Musculoskeletal: Negative.   Skin: Negative.   Neurological: Negative.   Psychiatric/Behavioral: Negative for depression, suicidal ideas, hallucinations, memory loss and substance abuse. The patient is not nervous/anxious and does not have insomnia.     Blood pressure 143/75, pulse 66, temperature 97.7 F (36.5 C), temperature source Oral, resp. rate 22, height 6' (1.829 m), weight 92.352 kg (203 lb 9.6 oz), SpO2 99 %.Body mass index is 27.61 kg/(m^2).  General Appearance: Casual  Eye Contact::   Good  Speech:  Clear and Coherent  Volume:  Normal  Mood:  Euthymic  Affect:  Appropriate  Thought Process:  Goal Directed  Orientation:  Full (Time, Place, and Person)  Thought Content:  Negative  Suicidal Thoughts:  No  Homicidal Thoughts:  No  Memory:  Immediate;   Good Recent;   Fair Remote;   Good  Judgement:  Fair  Insight:  Fair  Psychomotor Activity:  Normal  Concentration:  Fair  Recall:  [employer] of Knowledge:Fair  Language: Fair  Akathisia:  No  Handed:  Right  AIMS (if indicated):     Assets:  Communication Skills Desire for Improvement Financial Resources/Insurance Housing Resilience Social Support Talents/Skills  ADL's:  Intact  Cognition: WNL  Sleep:      Treatment Plan Summary: Daily contact with patient to assess and evaluate symptoms and progress in treatment, Medication management and Plan Patient appears to of calm down. I didn't think he was psychotic or dangerous yesterday but today he is even more calm without any real signs of hypomania. Suggest that we continue his medication as currently prescribed. No change to plan. Please contact the on-call psychiatry person for coverage over the weekend if necessary. If he's here after the weekend I'll follow-up Monday.  Disposition: Patient does not meet criteria for psychiatric inpatient admission. Supportive therapy provided about ongoing stressors.  Alethia Berthold, MD 06/25/2015 2:30 PM
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
## 2015-06-25 NOTE — Progress Notes (Signed)
°   06/25/15 0600  Clinical Encounter Type  Visit Type Code  Referral From Nurse  Consult/Referral To Chaplain  Responded to Code Blue. Patient stabilized. [place] Ext [phone]

## 2015-06-25 NOTE — Progress Notes (Signed)
Responded to Code Blue but pt was responsive and alert. Pt on 6L [place] with O2 sat of 97%. Multiple MD, RN, NT and RT staff at bedside. No new orders for RT.

## 2015-06-25 NOTE — Care Management (Signed)
Admitted to this facility with bilateral pneumonia, Lives with wife x 32 yreas,Frances. [phone] or [phone]). Seen Dr. Netty Starring last Friday. No home health. No skilled facility. Uses no home oxygen. Takes care of all basic and instrumental activities of daily living himself, drives. Last fall was July 31st 2016 per Mr. Matthey. Broke arch of left foot. Good appetite. No seizures. Wife will transport. Shelbie Ammons RN MSN CCM Care Management [phone]

## 2015-06-26 ENCOUNTER — Inpatient Hospital Stay
Admit: 2015-06-26 | Discharge: 2015-06-26 | Disposition: A | Discharge status: Home / Self Care | Payer: Medicare Other | Attending: Specialist | Admitting: Specialist

## 2015-06-26 LAB — BASIC METABOLIC PANEL
Anion gap: 5 (ref 5–15)
BUN: 11 mg/dL (ref 6–20)
CHLORIDE: 95 mmol/L — AB (ref 101–111)
CO2: 25 mmol/L (ref 22–32)
Calcium: 7.9 mg/dL — ABNORMAL LOW (ref 8.9–10.3)
Creatinine, Ser: 0.44 mg/dL — ABNORMAL LOW (ref 0.61–1.24)
GFR calc Af Amer: >60 mL/min (ref >60)
GFR calc non Af Amer: >60 mL/min (ref >60)
Glucose, Bld: 151 mg/dL — ABNORMAL HIGH (ref 65–99)
POTASSIUM: 4.4 mmol/L (ref 3.5–5.1)
SODIUM: 125 mmol/L — AB (ref 135–145)

## 2015-06-26 LAB — T4, FREE: Free T4: 1 ng/dL (ref 0.61–1.12)

## 2015-06-26 LAB — CORTISOL: Cortisol, Plasma: 4 ug/dL

## 2015-06-26 LAB — TSH: TSH: 0.281 u[IU]/mL — ABNORMAL LOW (ref 0.350–4.500)

## 2015-06-26 LAB — OSMOLALITY: Osmolality: 261 mOsm/kg — ABNORMAL LOW (ref 275–295)

## 2015-06-26 LAB — MRSA PCR SCREENING: MRSA BY PCR: NEGATIVE

## 2015-06-26 LAB — SODIUM, URINE, RANDOM: Sodium, Ur: 65 mmol/L

## 2015-06-26 LAB — OSMOLALITY, URINE: Osmolality, Ur: 571 mOsm/kg (ref 300–900)

## 2015-06-26 MED ORDER — AMLODIPINE BESYLATE 5 MG PO TABS
5.0000 mg | ORAL_TABLET | Freq: Once | ORAL | Status: AC
  Administered 2015-06-26: 5 mg via ORAL
  Filled 2015-06-26: qty 1

## 2015-06-26 MED ORDER — ONDANSETRON HCL 4 MG/2ML IJ SOLN
INTRAMUSCULAR | Status: AC
  Administered 2015-06-26: 11:00:00 4 mg
  Filled 2015-06-26: qty 2

## 2015-06-26 MED ORDER — QUETIAPINE FUMARATE 300 MG PO TABS
600.0000 mg | ORAL_TABLET | Freq: Every day | ORAL | Status: DC
  Administered 2015-06-26: 600 mg via ORAL
  Filled 2015-06-26 (×2): qty 2

## 2015-06-26 MED ORDER — ONDANSETRON HCL 4 MG/2ML IJ SOLN
4.0000 mg | Freq: Four times a day (QID) | INTRAMUSCULAR | Status: DC | PRN
  Administered 2015-06-27: 4 mg via INTRAVENOUS
  Filled 2015-06-26: qty 2

## 2015-06-26 MED ORDER — CETYLPYRIDINIUM CHLORIDE 0.05 % MT LIQD
7.0000 mL | Freq: Two times a day (BID) | OROMUCOSAL | Status: DC
  Administered 2015-06-26 – 2015-06-27 (×2): 7 mL via OROMUCOSAL

## 2015-06-26 MED ORDER — ENALAPRILAT 1.25 MG/ML IV SOLN
0.6250 mg | Freq: Once | INTRAVENOUS | Status: AC
  Administered 2015-06-26: 0.625 mg via INTRAVENOUS
  Filled 2015-06-26: qty 0.5

## 2015-06-26 MED ORDER — CALCIUM CARBONATE ANTACID 500 MG PO CHEW: 1.0000 | CHEWABLE_TABLET | Freq: Two times a day (BID) | ORAL | Status: DC

## 2015-06-26 MED ORDER — ENALAPRILAT 1.25 MG/ML IV SOLN
0.6250 mg | Freq: Once | INTRAVENOUS | Status: AC
  Administered 2015-06-27: 0.625 mg via INTRAVENOUS
  Filled 2015-06-26: qty 0.5

## 2015-06-26 NOTE — Progress Notes (Signed)
Report called to [facility] in ICU. Pt transported by nurse and nursing supervisor.

## 2015-06-26 NOTE — Progress Notes (Signed)
Pt. Refused lovenox, and has been r/t past GI bleed (per pt.) pt. Stated he was admitted to [facility] in [place] 12/16.  This ulcer is healing and pt. Has been told NO BLOOD THINNERS for 6 months until this heals.

## 2015-06-26 NOTE — Progress Notes (Addendum)
Pts wife approached nurse saying that her husband could not talk. Nurse walked into room patient was unable to speak and eyes were rolled back in his head. Pt called for charge nurse to help. Monitor was reading asystole. Code Blue was called and code blue team responded immediately. Nurse did several rounds of chest compressions. Back board was placed under patient. Pt began to cough and was then was responsive with pulse. Pt was able to respond appropriately and was alert and oriented when code team arrived to room. Dr. Verdell Carmine at bedside.  VSS. Dr. Verdell Carmine ordered to transfer to ICU for monitoring.
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
## 2015-06-26 NOTE — Progress Notes (Signed)
Pt.'s SBP >190, spoke with Dr. Jannifer Franklin.  Discussed pt.'s Hx, cardiology plan, MD ordered Vasotec. Will continue to monitor pt. Closely.

## 2015-06-26 NOTE — Progress Notes (Signed)
Pt in stable condition at this time.  No complaints of pain, A&O x4 today.  Family updated to plan of care for today.  Pt HR 86, no complains of chest pain or tightness.   Report given to Toribio Harbour, R.N.. Oncoming nurse.

## 2015-06-26 NOTE — Progress Notes (Addendum)
[facility] NAME: Douglas Perkins    MR#:  [id number]  DATE OF BIRTH:  [birth year]  SUBJECTIVE:   Patient this morning was noted to have a period of asystole/and was unresponsive and was started on CPR and shortly after resuming consciousness and rhythm returned to NSR.  Pt. Had a similar episode 2 nights ago.  He is currently awake, alert, oriented and denies any chest pain.  Wife at bedside. Shortly after he regained consciousness he developed some nausea and vomiting.  REVIEW OF SYSTEMS:    Review of Systems  Constitutional: Negative for fever and chills.  HENT: Negative for congestion and tinnitus.   Eyes: Negative for blurred vision and double vision.  Respiratory: Negative for cough, shortness of breath and wheezing.   Cardiovascular: Negative for chest pain, orthopnea and PND.  Gastrointestinal: Positive for nausea and vomiting. Negative for abdominal pain and diarrhea.  Genitourinary: Negative for dysuria and hematuria.  Neurological: Negative for dizziness, sensory change and focal weakness.  All other systems reviewed and are negative.   Nutrition: Heart healthy Tolerating Diet: Yes Tolerating PT: Await Eval.    DRUG ALLERGIES:   Allergies  Allergen Reactions  . Other Hives, Shortness Of Breath and Other (See Comments)    Pt states that he is allergic to unwashed blood products.    . Feldene [Piroxicam] Hives  . Sulfa Antibiotics Hives    VITALS:  Blood pressure 133/75, pulse 58, temperature 97.8 F (36.6 C), temperature source Oral, resp. rate 20, height 6' (1.829 m), weight 92.352 kg (203 lb 9.6 oz), SpO2 100 %.  PHYSICAL EXAMINATION:   Physical Exam  GENERAL:  70 y.o.-year-old patient lying in the bed lethargic but in no acute distress.  EYES: Pupils equal, round, reactive to light and accommodation. No scleral icterus. Extraocular muscles intact.  HEENT: Head atraumatic, normocephalic. Oropharynx  and nasopharynx clear.  NECK:  Supple, no jugular venous distention. No thyroid enlargement, no tenderness.  LUNGS: Normal breath sounds bilaterally, no wheezing, rales, rhonchi. No use of accessory muscles of respiration.  CARDIOVASCULAR: S1, S2 normal. No murmurs, rubs, or gallops.  ABDOMEN: Soft, nontender, nondistended. Bowel sounds present. No organomegaly or mass.  EXTREMITIES: No cyanosis, clubbing or edema b/l.    NEUROLOGIC: Cranial nerves II through XII are intact. No focal Motor or sensory deficits b/l.   PSYCHIATRIC: The patient is alert and oriented x 3.  SKIN: No obvious rash, lesion, or ulcer.    LABORATORY PANEL:   CBC  Recent Labs Lab 06/24/15 0344  WBC 5.0  HGB 8.2*  HCT 24.7*  PLT 286   ------------------------------------------------------------------------------------------------------------------  Chemistries   Recent Labs Lab 06/25/15 1421 06/26/15 0427  NA  --  125*  K  --  4.4  CL  --  95*  CO2  --  25  GLUCOSE  --  151*  BUN  --  11  CREATININE  --  0.44*  CALCIUM  --  7.9*  MG 1.9  --    ------------------------------------------------------------------------------------------------------------------  Cardiac Enzymes  Recent Labs Lab 06/25/15 1421  TROPONINI 0.15*   ------------------------------------------------------------------------------------------------------------------  RADIOLOGY:  Dg Chest Port 1 View  06/25/2015  CLINICAL DATA:  Code blue, initially unresponsive, patient was alert at the time of exam; history of pneumonia and cardiac dysrhythmia EXAM: PORTABLE CHEST 1 VIEW COMPARISON:  PA and lateral chest x-ray of June 23, 2015 FINDINGS: The lungs are adequately inflated. Confluent interstitial infiltrates in both upper lobes  are more conspicuous today. Minimal increased infrahilar density bilaterally is also present. The heart remains enlarged. The pulmonary vascularity is not clearly engorged. IMPRESSION: Progression  of the confluent interstitial process predominantly in the upper lobes. Stable mild cardiomegaly without definite pulmonary vascular congestion. Electronically Signed   By: David  Martinique M.D.   On: 06/25/2015 07:08     ASSESSMENT AND PLAN:   70 year old male with past medical history of bipolar disorder, hypertension, glaucoma, history of previous TIA who presented to the hospital due to chest pain.  #1 asystole/sinus arrest - patient has had 2-3 episodes of asystole and sinus arrest in the hospital since admission. He had an episode this morning.   -Echocardiogram shows inferior apical wall hypokinesis. Seen by cardiology and patient probably needs a permanent pacemaker and likely cardiac catheterization. -Transferred to ICU and pacer pads placed.  - avoid B-blocker and will cont. To monitor.   #2 Elevated Troponin - due to asystole/sinus arrest and also from pt. Getting CPR.  - needs cardiac catherization and Card to plan for it after pacemaker placement.   #3 pneumonia-patient had bilateral upper lobe pneumonia on chest x-ray admission. -Continue Levaquin, IV steroids, albuterol nebulizers. Seen by pulmonary and continue current care. If not improving would consider bronchoscopy.  #4 showed bipolar disorder-continue Tegretol, Abilify, Seroquel. -Seen by psychiatry and no change in med management.  #5 glaucoma-continue Timolol.   #6 GERD - cont. Protonix.   #7 Essential HTN - cont. Norvasc, Lisinopril - BP stable.   #8 Hyponatremia - etiology unclear.  Euvolemic.  - will get Urine, Serum Osm and monitor.   All the records are reviewed and case discussed with Care Management/Social Workerr. Management plans discussed with the patient, family and they are in agreement.  CODE STATUS: Full  DVT Prophylaxis: Lovenox  TOTAL Critical TIME TAKING CARE OF THIS PATIENT: 35 minutes.   POSSIBLE D/C IN 2-3 DAYS, DEPENDING ON CLINICAL CONDITION.   Henreitta Leber M.D on 06/26/2015 at  3:17 PM  Between 7am to 6pm - Pager - [phone]  After 6pm go to [email or web address] - password [email or web address] [facility]  Office  [phone]  CC: Primary care physician; [name], MD

## 2015-06-26 NOTE — Progress Notes (Signed)
Received report from [place], [place] finishing @ 2000.  Pt.'s BP is >170 1900 and @ 2000.  Paged [facility], spoke w/ Dr. Earleen Newport discussed pt.'s Hx, episodes of PEA, cardiology plan (per Dr. Alveria Apley note).  MD ordered additional dose of Norvasc will continue to monitor pt. Closely.

## 2015-06-27 ENCOUNTER — Ambulatory Visit (HOSPITAL_COMMUNITY)
Admission: AD | Admit: 2015-06-27 | Discharge: 2015-06-27 | Disposition: A | Discharge status: Home / Self Care | Payer: Medicare Other | Source: Other Acute Inpatient Hospital | Attending: Specialist | Admitting: Specialist

## 2015-06-27 DIAGNOSIS — I509 Heart failure, unspecified: Secondary | ICD-10-CM | POA: Insufficient documentation

## 2015-06-27 LAB — BASIC METABOLIC PANEL
Anion gap: 7 (ref 5–15)
BUN: 9 mg/dL (ref 6–20)
CHLORIDE: 92 mmol/L — AB (ref 101–111)
CO2: 26 mmol/L (ref 22–32)
CREATININE: 0.37 mg/dL — AB (ref 0.61–1.24)
Calcium: 7.9 mg/dL — ABNORMAL LOW (ref 8.9–10.3)
GFR calc non Af Amer: >60 mL/min (ref >60)
Glucose, Bld: 155 mg/dL — ABNORMAL HIGH (ref 65–99)
Potassium: 4.2 mmol/L (ref 3.5–5.1)
Sodium: 125 mmol/L — ABNORMAL LOW (ref 135–145)

## 2015-06-27 LAB — CBC
HEMATOCRIT: 26 % — AB (ref 40.0–52.0)
HEMOGLOBIN: 8.7 g/dL — AB (ref 13.0–18.0)
MCH: 27.4 pg (ref 26.0–34.0)
MCHC: 33.3 g/dL (ref 32.0–36.0)
MCV: 82.1 fL (ref 80.0–100.0)
Platelets: 295 10*3/uL (ref 150–440)
RBC: 3.17 MIL/uL — ABNORMAL LOW (ref 4.40–5.90)
RDW: 17.6 % — ABNORMAL HIGH (ref 11.5–14.5)
WBC: 7.7 10*3/uL (ref 3.8–10.6)

## 2015-06-27 MED ORDER — METHYLPREDNISOLONE SODIUM SUCC 40 MG IJ SOLR: 40.0000 mg | Freq: Two times a day (BID) | INTRAMUSCULAR | Status: DC

## 2015-06-27 MED ORDER — LEVOFLOXACIN IN D5W 750 MG/150ML IV SOLN: 750.0000 mg | INTRAVENOUS | Status: DC

## 2015-06-27 MED ORDER — ATORVASTATIN CALCIUM 20 MG PO TABS: 20.0000 mg | ORAL_TABLET | Freq: Every day | ORAL | Status: DC

## 2015-06-27 MED ORDER — CALCIUM CARBONATE ANTACID 500 MG PO CHEW
1.0000 | CHEWABLE_TABLET | Freq: Two times a day (BID) | ORAL | Status: DC
  Administered 2015-06-27 (×2): 200 mg via ORAL
  Filled 2015-06-27 (×2): qty 1

## 2015-06-27 NOTE — Discharge Summary (Signed)
[facility] NAME: Douglas Perkins    MR#:  [id number]  DATE OF BIRTH:  1946/02/21  DATE OF ADMISSION:  06/23/2015 ADMITTING PHYSICIAN: Nicholes Mango, MD  DATE OF DISCHARGE: 06/27/2015  PRIMARY CARE PHYSICIAN: [name], MD    ADMISSION DIAGNOSIS:  Bilateral pneumonia [J18.9] Acute respiratory failure with hypoxia ([place] [J96.01] Abdominal pain [R10.9] Chest pain [R07.9]  DISCHARGE DIAGNOSIS:  Active Problems:   Bilateral pneumonia   Bipolar I disorder ([place] [facility])   SECONDARY DIAGNOSIS:   Past Medical History  Diagnosis Date  . Hypertension   . Glaucoma   . Cancer (HCC)     merkel cell cancer  . Cancer [facility])     prostate  . TIA (transient ischemic attack)   . Bipolar 1 disorder [facility])     HOSPITAL COURSE:   70 year old male with past medical history of bipolar disorder, hypertension, glaucoma, history of previous TIA who presented to the hospital due to chest pain.  #1 asystole/sinus arrest - patient was admitted to the hospital with chest pain and had only a mildly elevated troponin but while the patient has been in the hospital he has had 2-3 episodes of significant long pauses with sinus arrest requiring CPR.  -A cardiology consult was obtained and patient was seen by Dr. Nehemiah Massed. As per cardiology patient would benefit from a permanent pacemaker placement and also a cardiac catheterization. Patient is currently being transferred to [facility] for possible permanent pacemaker placement. -His Echocardiogram shows inferior apical wall hypokinesis. Seen by cardiology and patient probably needs a cardiac catheterization. -Patient has been taken off his beta blocker indefinitely.  #2 Elevated Troponin - due to asystole/sinus arrest and also from pt. Getting CPR.  - needs cardiac catherization and and this is to be done later point after his pacemaker placement.  #3 pneumonia-patient had bilateral  upper lobe pneumonia on chest x-ray admission. -He will Continue Levaquin, IV steroids which can be tapered within the next 1-2 days, albuterol nebulizers.   #4 bipolar disorder-continue Tegretol, Abilify, Seroquel. -he was Seen by psychiatry and no change in med management.  #5 glaucoma-he will continue Timolol.   #6 GERD - he will  cont. Protonix.   #7 Essential HTN - he will cont. Norvasc, Lisinopril - BP stable.   #8 Hyponatremia - she has been hyponatremic with a sodium of 125. His urine osmolality is on the higher side and serum osmolality is low. 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 -His sodium further needs to be followed. He is clinically asymptomatic presently.  She is being transferred to [facility] for pacemaker placement.  DISCHARGE CONDITIONS:   Stable  CONSULTS OBTAINED:  Treatment Team:  Gonzella Lex, MD Corey Skains, MD Allyne Gee, MD Henreitta Leber, MD  DRUG ALLERGIES:   Allergies  Allergen Reactions  . Other Hives, Shortness Of Breath and Other (See Comments)    Pt states that he is allergic to unwashed blood products.    . Feldene [Piroxicam] Hives  . Sulfa Antibiotics Hives    DISCHARGE MEDICATIONS:   Current Discharge Medication List    START taking these medications   Details  atorvastatin (LIPITOR) 20 MG tablet Take 1 tablet (20 mg total) by mouth daily at 6 PM.    levofloxacin (LEVAQUIN) 750 MG/150ML SOLN Inject 150 mLs (750 mg total) into the vein daily. Qty: 1000 mL    methylPREDNISolone sodium succinate (SOLU-MEDROL) 40  mg/mL injection Inject 1 mL (40 mg total) into the vein every 12 (twelve) hours. Qty: 1 each, Refills: 0      CONTINUE these medications which have NOT CHANGED   Details  amLODipine (NORVASC) 5 MG tablet Take 5 mg by mouth daily.    ARIPiprazole (ABILIFY) 5 MG tablet Take 5 mg by mouth 2 (two) times daily.     carbamazepine (TEGRETOL) 200 MG tablet Take 600 mg by  mouth 2 (two) times daily.    ferrous sulfate 325 (65 FE) MG tablet Take 650 mg by mouth daily with breakfast.    gabapentin (NEURONTIN) 300 MG capsule Take 300 mg by mouth 2 (two) times daily.    glucosamine-chondroitin 500-400 MG tablet Take 1 tablet by mouth daily.    latanoprost (XALATAN) 0.005 % ophthalmic solution Place 1 drop into both eyes at bedtime.    lisinopril (PRINIVIL,ZESTRIL) 40 MG tablet Take 40 mg by mouth every evening.     Multiple Vitamin (MULTIVITAMIN WITH MINERALS) TABS tablet Take 1 tablet by mouth daily.    Omega-3 Fatty Acids (FISH OIL) 1000 MG CAPS Take 1,000 mg by mouth 2 (two) times daily.     pantoprazole (PROTONIX) 40 MG tablet Take 40 mg by mouth 2 (two) times daily.    Associated Diagnoses: 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HCC)    QUEtiapine (SEROQUEL) 300 MG tablet Take 600 mg by mouth at bedtime.    temazepam (RESTORIL) 15 MG capsule Take 15 mg by mouth at bedtime as needed for sleep.    timolol (BETIMOL) 0.5 % ophthalmic solution Place 1 drop into the right eye daily.      STOP taking these medications     metoprolol (LOPRESSOR) 50 MG tablet      simvastatin (ZOCOR) 40 MG tablet          DISCHARGE INSTRUCTIONS:   DIET:  Cardiac diet  DISCHARGE CONDITION:  Stable  ACTIVITY:  Activity as tolerated  OXYGEN:  Home Oxygen: No.   Oxygen Delivery: room air  DISCHARGE LOCATION:  [facility].   If you experience worsening of your admission symptoms, develop shortness of breath, life threatening emergency, suicidal or homicidal thoughts you must seek medical attention immediately by calling 911 or calling your MD immediately  if symptoms less severe.  You Must read complete instructions/literature along with all the possible adverse reactions/side effects for all the Medicines you take and that have been prescribed to you. Take any new Medicines after you have completely understood and accpet all the possible adverse reactions/side  effects.   Please note  You were cared for by a hospitalist during your hospital stay. If you have any questions about your discharge medications or the care you received while you were in the hospital after you are discharged, you can call the unit and asked to speak with the hospitalist on call if the hospitalist that took care of you is not available. Once you are discharged, your primary care physician will handle any further medical issues. Please note that NO REFILLS for any discharge medications will be authorized once you are discharged, as it is imperative that you return to your primary care physician (or establish a relationship with a primary care physician if you do not have one) for your aftercare needs so that they can reassess your need for medications and monitor your lab values.     Today   Hemodynamically stable. No further sinus pauses or sinus arrest. Family at bedside. Has some Chest  discomfort due to CPR he got yesterday.   VITAL SIGNS:  Blood pressure 167/78, pulse 85, temperature 97.9 F (36.6 C), temperature source Oral, resp. rate 25, height 6' (1.829 m), weight 92.352 kg (203 lb 9.6 oz), SpO2 98 %.  I/O:   Intake/Output Summary (Last 24 hours) at 06/27/15 1516 Last data filed at 06/27/15 1452  Gross per 24 hour  Intake   1317 ml  Output   3725 ml  Net  -2408 ml    PHYSICAL EXAMINATION:   GENERAL: 70 y.o.-year-old patient lying in the bed in no acute distress.  EYES: Pupils equal, round, reactive to light and accommodation. No scleral icterus. Extraocular muscles intact.  HEENT: Head atraumatic, normocephalic. Oropharynx and nasopharynx clear.  NECK: Supple, no jugular venous distention. No thyroid enlargement, no tenderness.  LUNGS: Normal breath sounds bilaterally, no wheezing, rales, rhonchi. No use of accessory muscles of respiration.  CARDIOVASCULAR: S1, S2 normal. No murmurs, rubs, or gallops.  ABDOMEN: Soft, nontender, nondistended. Bowel  sounds present. No organomegaly or mass.  EXTREMITIES: No cyanosis, clubbing or edema b/l.  NEUROLOGIC: Cranial nerves II through XII are intact. No focal Motor or sensory deficits b/l.  PSYCHIATRIC: The patient is alert and oriented x 3.  SKIN: No obvious rash, lesion, or ulcer.   DATA REVIEW:   CBC  Recent Labs Lab 06/27/15 0425  WBC 7.7  HGB 8.7*  HCT 26.0*  PLT 295    Chemistries   Recent Labs Lab 06/25/15 1421  06/27/15 0425  NA  --   < > 125*  K  --   < > 4.2  CL  --   < > 92*  CO2  --   < > 26  GLUCOSE  --   < > 155*  BUN  --   < > 9  CREATININE  --   < > 0.37*  CALCIUM  --   < > 7.9*  MG 1.9  --   --   < > = values in this interval not displayed.  Cardiac Enzymes  Recent Labs Lab 06/25/15 1421  TROPONINI 0.15*    Microbiology Results  Results for orders placed or performed during the hospital encounter of 06/23/15  Rapid Influenza A&B Antigens ([facility] only)     Status: None   Collection Time: 06/23/15  7:36 PM  Result Value Ref Range Status   Influenza A [facility]) NOT DETECTED  Final   Influenza B ([facility]) NOT DETECTED  Final  MRSA PCR Screening     Status: None   Collection Time: 06/26/15 11:12 AM  Result Value Ref Range Status   MRSA by PCR NEGATIVE NEGATIVE Final    Comment:        The GeneXpert MRSA Assay (FDA approved for NASAL specimens only), is one component of a comprehensive MRSA colonization surveillance program. It is not intended to diagnose MRSA infection nor to guide or monitor treatment for MRSA infections.     RADIOLOGY:  No results found.    Management plans discussed with the patient, family and they are in agreement.  CODE STATUS:     Code Status Orders        Start     Ordered   06/23/15 2243  Full code   Continuous     06/23/15 2242    Code Status History    Date Active Date Inactive Code Status Order ID Comments User Context   This patient has a current code status but no historical code  status.     Advance Directive Documentation        Most Recent Value   Type of Advance Directive  Living will, Healthcare Power of Attorney [POA: Joaquim Lai Koehler]   Pre-existing out of facility DNR order (yellow form or pink MOST form)     "MOST" Form in Place?        TOTAL TIME TAKING CARE OF THIS PATIENT: 45 minutes.    Henreitta Leber M.D on 06/27/2015 at 3:16 PM  Between 7am to 6pm - Pager - [phone]  After 6pm go to [email or web address] - password [email or web address]  Office  [phone]  CC: Primary care physician; [name], MD
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
## 2015-06-27 NOTE — Progress Notes (Signed)
Lorriane Shire, from [facility] transfer Center called to get a chart update.  [facility] is not on ICU divert anymore, but no bed at this time. Pt. Is still on the list.

## 2015-07-01 LAB — MISC LABCORP TEST (SEND OUT): Labcorp test code: 9985

## 2015-07-07 ENCOUNTER — Inpatient Hospital Stay
Admission: EM | Admit: 2015-07-07 | Discharge: 2015-07-12 | DRG: 640 | Disposition: A | Discharge status: Home / Self Care | Payer: Medicare Other | Attending: Internal Medicine | Admitting: Internal Medicine

## 2015-07-07 ENCOUNTER — Emergency Department: Payer: Medicare Other

## 2015-07-07 ENCOUNTER — Encounter: Payer: Self-pay | Admitting: Emergency Medicine

## 2015-07-07 DIAGNOSIS — T68XXXA Hypothermia, initial encounter: Secondary | ICD-10-CM | POA: Diagnosis present

## 2015-07-07 DIAGNOSIS — H409 Unspecified glaucoma: Secondary | ICD-10-CM | POA: Diagnosis present

## 2015-07-07 DIAGNOSIS — Z79899 Other long term (current) drug therapy: Secondary | ICD-10-CM

## 2015-07-07 DIAGNOSIS — E871 Hypo-osmolality and hyponatremia: Secondary | ICD-10-CM

## 2015-07-07 DIAGNOSIS — Z882 Allergy status to sulfonamides status: Secondary | ICD-10-CM | POA: Diagnosis not present

## 2015-07-07 DIAGNOSIS — I252 Old myocardial infarction: Secondary | ICD-10-CM | POA: Diagnosis not present

## 2015-07-07 DIAGNOSIS — F319 Bipolar disorder, unspecified: Secondary | ICD-10-CM | POA: Diagnosis present

## 2015-07-07 DIAGNOSIS — Z950 Presence of cardiac pacemaker: Secondary | ICD-10-CM

## 2015-07-07 DIAGNOSIS — Z8673 Personal history of transient ischemic attack (TIA), and cerebral infarction without residual deficits: Secondary | ICD-10-CM | POA: Diagnosis not present

## 2015-07-07 DIAGNOSIS — R579 Shock, unspecified: Secondary | ICD-10-CM

## 2015-07-07 DIAGNOSIS — N179 Acute kidney failure, unspecified: Secondary | ICD-10-CM | POA: Diagnosis present

## 2015-07-07 DIAGNOSIS — Z85828 Personal history of other malignant neoplasm of skin: Secondary | ICD-10-CM | POA: Diagnosis not present

## 2015-07-07 DIAGNOSIS — E860 Dehydration: Principal | ICD-10-CM | POA: Diagnosis present

## 2015-07-07 DIAGNOSIS — Z9049 Acquired absence of other specified parts of digestive tract: Secondary | ICD-10-CM | POA: Diagnosis not present

## 2015-07-07 DIAGNOSIS — Z8249 Family history of ischemic heart disease and other diseases of the circulatory system: Secondary | ICD-10-CM

## 2015-07-07 DIAGNOSIS — R55 Syncope and collapse: Secondary | ICD-10-CM | POA: Diagnosis not present

## 2015-07-07 DIAGNOSIS — Z823 Family history of stroke: Secondary | ICD-10-CM

## 2015-07-07 DIAGNOSIS — G9341 Metabolic encephalopathy: Secondary | ICD-10-CM | POA: Diagnosis present

## 2015-07-07 DIAGNOSIS — R400 Somnolence: Secondary | ICD-10-CM

## 2015-07-07 DIAGNOSIS — M6282 Rhabdomyolysis: Secondary | ICD-10-CM | POA: Diagnosis present

## 2015-07-07 DIAGNOSIS — I10 Essential (primary) hypertension: Secondary | ICD-10-CM | POA: Diagnosis present

## 2015-07-07 DIAGNOSIS — I2510 Atherosclerotic heart disease of native coronary artery without angina pectoris: Secondary | ICD-10-CM | POA: Diagnosis present

## 2015-07-07 DIAGNOSIS — R404 Transient alteration of awareness: Secondary | ICD-10-CM | POA: Diagnosis not present

## 2015-07-07 DIAGNOSIS — A419 Sepsis, unspecified organism: Secondary | ICD-10-CM | POA: Diagnosis present

## 2015-07-07 DIAGNOSIS — E872 Acidosis: Secondary | ICD-10-CM | POA: Diagnosis present

## 2015-07-07 DIAGNOSIS — G9340 Encephalopathy, unspecified: Secondary | ICD-10-CM

## 2015-07-07 DIAGNOSIS — E876 Hypokalemia: Secondary | ICD-10-CM | POA: Diagnosis present

## 2015-07-07 DIAGNOSIS — Z87891 Personal history of nicotine dependence: Secondary | ICD-10-CM | POA: Diagnosis not present

## 2015-07-07 DIAGNOSIS — Z888 Allergy status to other drugs, medicaments and biological substances status: Secondary | ICD-10-CM

## 2015-07-07 DIAGNOSIS — E785 Hyperlipidemia, unspecified: Secondary | ICD-10-CM | POA: Diagnosis present

## 2015-07-07 DIAGNOSIS — E875 Hyperkalemia: Secondary | ICD-10-CM | POA: Diagnosis not present

## 2015-07-07 DIAGNOSIS — Z8546 Personal history of malignant neoplasm of prostate: Secondary | ICD-10-CM | POA: Diagnosis not present

## 2015-07-07 DIAGNOSIS — R6510 Systemic inflammatory response syndrome (SIRS) of non-infectious origin without acute organ dysfunction: Secondary | ICD-10-CM | POA: Diagnosis present

## 2015-07-07 HISTORY — DX: Presence of cardiac pacemaker: Z95.0

## 2015-07-07 LAB — URINALYSIS COMPLETE WITH MICROSCOPIC (ARMC ONLY)
BILIRUBIN URINE: NEGATIVE
Bacteria, UA: NONE SEEN
GLUCOSE, UA: NEGATIVE mg/dL
KETONES UR: NEGATIVE mg/dL
Leukocytes, UA: NEGATIVE
NITRITE: NEGATIVE
Protein, ur: 30 mg/dL — AB
Specific Gravity, Urine: 1.005 (ref 1.005–1.030)
pH: 8 (ref 5.0–8.0)

## 2015-07-07 LAB — BLOOD GAS, ARTERIAL
ACID-BASE DEFICIT: 0.8 mmol/L (ref 0.0–2.0)
ALLENS TEST (PASS/FAIL): POSITIVE — AB
Acid-base deficit: 10.1 mmol/L — ABNORMAL HIGH (ref 0.0–2.0)
Allens test (pass/fail): POSITIVE — AB
Bicarbonate: 18.9 mEq/L — ABNORMAL LOW (ref 21.0–28.0)
Bicarbonate: 23.4 mEq/L (ref 21.0–28.0)
FIO2: 0.28
FIO2: 0.28
O2 SAT: 99.1 %
O2 Saturation: 96.8 %
PCO2 ART: 36 mmHg (ref 32.0–48.0)
PH ART: 7.16 — AB (ref 7.350–7.450)
Patient temperature: 37
Patient temperature: 37
pCO2 arterial: 53 mmHg — ABNORMAL HIGH (ref 32.0–48.0)
pH, Arterial: 7.42 (ref 7.350–7.450)
pO2, Arterial: 111 mmHg — ABNORMAL HIGH (ref 83.0–108.0)
pO2, Arterial: 133 mmHg — ABNORMAL HIGH (ref 83.0–108.0)

## 2015-07-07 LAB — TROPONIN I: TROPONIN I: 0.06 ng/mL — AB (ref <0.031)

## 2015-07-07 LAB — CBC WITH DIFFERENTIAL/PLATELET
BASOS ABS: 0 10*3/uL (ref 0–0.1)
BASOS PCT: 0 %
EOS ABS: 0 10*3/uL (ref 0–0.7)
EOS PCT: 0 %
HCT: 28.6 % — ABNORMAL LOW (ref 40.0–52.0)
HEMOGLOBIN: 9.3 g/dL — AB (ref 13.0–18.0)
LYMPHS ABS: 0.6 10*3/uL — AB (ref 1.0–3.6)
Lymphocytes Relative: 7 %
MCH: 26.7 pg (ref 26.0–34.0)
MCHC: 32.7 g/dL (ref 32.0–36.0)
MCV: 81.5 fL (ref 80.0–100.0)
Monocytes Absolute: 0.6 10*3/uL (ref 0.2–1.0)
Monocytes Relative: 7 %
NEUTROS PCT: 86 %
Neutro Abs: 8.2 10*3/uL — ABNORMAL HIGH (ref 1.4–6.5)
PLATELETS: 416 10*3/uL (ref 150–440)
RBC: 3.5 MIL/uL — AB (ref 4.40–5.90)
RDW: 18.5 % — ABNORMAL HIGH (ref 11.5–14.5)
WBC: 9.5 10*3/uL (ref 3.8–10.6)

## 2015-07-07 LAB — COMPREHENSIVE METABOLIC PANEL
ALBUMIN: 3.4 g/dL — AB (ref 3.5–5.0)
ALK PHOS: 118 U/L (ref 38–126)
ALT: 84 U/L — AB (ref 17–63)
AST: 69 U/L — AB (ref 15–41)
Anion gap: 12 (ref 5–15)
BUN: 24 mg/dL — ABNORMAL HIGH (ref 6–20)
CALCIUM: 8.9 mg/dL (ref 8.9–10.3)
CHLORIDE: 95 mmol/L — AB (ref 101–111)
CO2: 22 mmol/L (ref 22–32)
CREATININE: 1.51 mg/dL — AB (ref 0.61–1.24)
GFR calc non Af Amer: 45 mL/min — ABNORMAL LOW (ref >60)
GFR, EST AFRICAN AMERICAN: 52 mL/min — AB (ref >60)
GLUCOSE: 155 mg/dL — AB (ref 65–99)
Potassium: 5.7 mmol/L — ABNORMAL HIGH (ref 3.5–5.1)
SODIUM: 129 mmol/L — AB (ref 135–145)
Total Bilirubin: 0.2 mg/dL — ABNORMAL LOW (ref 0.3–1.2)
Total Protein: 7.5 g/dL (ref 6.5–8.1)

## 2015-07-07 LAB — ACETAMINOPHEN LEVEL: Acetaminophen (Tylenol), Serum: <10 ug/mL — ABNORMAL LOW (ref 10–30)

## 2015-07-07 LAB — BASIC METABOLIC PANEL
Anion gap: 9 (ref 5–15)
BUN: 23 mg/dL — ABNORMAL HIGH (ref 6–20)
CALCIUM: 8.2 mg/dL — AB (ref 8.9–10.3)
CO2: 23 mmol/L (ref 22–32)
CREATININE: 1.31 mg/dL — AB (ref 0.61–1.24)
Chloride: 101 mmol/L (ref 101–111)
GFR calc non Af Amer: 53 mL/min — ABNORMAL LOW (ref >60)
Glucose, Bld: 121 mg/dL — ABNORMAL HIGH (ref 65–99)
Potassium: 4.4 mmol/L (ref 3.5–5.1)
SODIUM: 133 mmol/L — AB (ref 135–145)

## 2015-07-07 LAB — GLUCOSE, CAPILLARY: GLUCOSE-CAPILLARY: 131 mg/dL — AB (ref 65–99)

## 2015-07-07 LAB — SODIUM, URINE, RANDOM: Sodium, Ur: 43 mmol/L

## 2015-07-07 LAB — SALICYLATE LEVEL

## 2015-07-07 LAB — CK: Total CK: 749 U/L — ABNORMAL HIGH (ref 49–397)

## 2015-07-07 LAB — LIPASE, BLOOD: Lipase: 30 U/L (ref 11–51)

## 2015-07-07 LAB — SODIUM: SODIUM: 134 mmol/L — AB (ref 135–145)

## 2015-07-07 LAB — BRAIN NATRIURETIC PEPTIDE: B Natriuretic Peptide: 251 pg/mL — ABNORMAL HIGH (ref 0.0–100.0)

## 2015-07-07 LAB — LACTIC ACID, PLASMA: Lactic Acid, Venous: 1.9 mmol/L (ref 0.5–2.0)

## 2015-07-07 MED ORDER — AMLODIPINE BESYLATE 5 MG PO TABS
5.0000 mg | ORAL_TABLET | Freq: Every day | ORAL | Status: DC
  Administered 2015-07-07 – 2015-07-12 (×6): 5 mg via ORAL
  Filled 2015-07-07 (×6): qty 1

## 2015-07-07 MED ORDER — QUETIAPINE FUMARATE 300 MG PO TABS
600.0000 mg | ORAL_TABLET | Freq: Every day | ORAL | Status: DC
  Administered 2015-07-07 – 2015-07-11 (×5): 600 mg via ORAL
  Filled 2015-07-07 (×6): qty 2

## 2015-07-07 MED ORDER — SODIUM CHLORIDE 0.9 % IV BOLUS (SEPSIS)
1000.0000 mL | Freq: Once | INTRAVENOUS | Status: AC
  Administered 2015-07-07: 1000 mL via INTRAVENOUS

## 2015-07-07 MED ORDER — PIPERACILLIN-TAZOBACTAM 3.375 G IVPB
3.3750 g | Freq: Three times a day (TID) | INTRAVENOUS | Status: DC
  Filled 2015-07-07 (×2): qty 50

## 2015-07-07 MED ORDER — TIMOLOL MALEATE 0.5 % OP SOLN
1.0000 [drp] | Freq: Every day | OPHTHALMIC | Status: DC
  Administered 2015-07-07 – 2015-07-12 (×6): 1 [drp] via OPHTHALMIC
  Filled 2015-07-07: qty 5

## 2015-07-07 MED ORDER — SODIUM CHLORIDE 0.9 % IV SOLN
INTRAVENOUS | Status: DC
  Administered 2015-07-07 – 2015-07-08 (×3): via INTRAVENOUS

## 2015-07-07 MED ORDER — ATORVASTATIN CALCIUM 20 MG PO TABS
20.0000 mg | ORAL_TABLET | Freq: Every day | ORAL | Status: DC
  Administered 2015-07-07: 20 mg via ORAL
  Filled 2015-07-07: qty 1

## 2015-07-07 MED ORDER — CARBAMAZEPINE 200 MG PO TABS
600.0000 mg | ORAL_TABLET | Freq: Two times a day (BID) | ORAL | Status: DC
  Administered 2015-07-07 – 2015-07-12 (×11): 600 mg via ORAL
  Filled 2015-07-07 (×11): qty 3

## 2015-07-07 MED ORDER — LATANOPROST 0.005 % OP SOLN
1.0000 [drp] | Freq: Every day | OPHTHALMIC | Status: DC
  Administered 2015-07-07 – 2015-07-11 (×5): 1 [drp] via OPHTHALMIC
  Filled 2015-07-07 (×2): qty 2.5

## 2015-07-07 MED ORDER — VANCOMYCIN HCL IN DEXTROSE 1-5 GM/200ML-% IV SOLN
1000.0000 mg | Freq: Once | INTRAVENOUS | Status: AC
  Administered 2015-07-07: 1000 mg via INTRAVENOUS
  Filled 2015-07-07 (×2): qty 200

## 2015-07-07 MED ORDER — PANTOPRAZOLE SODIUM 40 MG PO TBEC
40.0000 mg | DELAYED_RELEASE_TABLET | Freq: Two times a day (BID) | ORAL | Status: DC
  Administered 2015-07-07 – 2015-07-12 (×11): 40 mg via ORAL
  Filled 2015-07-07 (×11): qty 1

## 2015-07-07 MED ORDER — VANCOMYCIN HCL IN DEXTROSE 1-5 GM/200ML-% IV SOLN
1000.0000 mg | Freq: Once | INTRAVENOUS | Status: DC
  Filled 2015-07-07: qty 200

## 2015-07-07 MED ORDER — NALOXONE HCL 2 MG/2ML IJ SOSY
0.4000 mg | PREFILLED_SYRINGE | Freq: Once | INTRAMUSCULAR | Status: AC
  Administered 2015-07-07: 0.4 mg via INTRAVENOUS
  Filled 2015-07-07: qty 2

## 2015-07-07 MED ORDER — HYDRALAZINE HCL 20 MG/ML IJ SOLN
10.0000 mg | Freq: Four times a day (QID) | INTRAMUSCULAR | Status: DC | PRN
  Administered 2015-07-09 – 2015-07-11 (×2): 10 mg via INTRAVENOUS
  Filled 2015-07-07 (×2): qty 1

## 2015-07-07 MED ORDER — VANCOMYCIN HCL IN DEXTROSE 1-5 GM/200ML-% IV SOLN: 1000.0000 mg | INTRAVENOUS | Status: DC

## 2015-07-07 MED ORDER — METOPROLOL SUCCINATE ER 50 MG PO TB24
50.0000 mg | ORAL_TABLET | Freq: Every day | ORAL | Status: DC
  Administered 2015-07-07 – 2015-07-12 (×6): 50 mg via ORAL
  Filled 2015-07-07 (×6): qty 1

## 2015-07-07 MED ORDER — HEPARIN SODIUM (PORCINE) 5000 UNIT/ML IJ SOLN
5000.0000 [IU] | Freq: Three times a day (TID) | INTRAMUSCULAR | Status: DC
  Administered 2015-07-07 – 2015-07-08 (×3): 5000 [IU] via SUBCUTANEOUS
  Filled 2015-07-07 (×4): qty 1

## 2015-07-07 MED ORDER — GABAPENTIN 300 MG PO CAPS
300.0000 mg | ORAL_CAPSULE | Freq: Two times a day (BID) | ORAL | Status: DC
  Administered 2015-07-07 – 2015-07-12 (×10): 300 mg via ORAL
  Filled 2015-07-07 (×11): qty 1

## 2015-07-07 MED ORDER — SODIUM CHLORIDE 0.9 % IV SOLN
1.0000 g | Freq: Once | INTRAVENOUS | Status: DC
  Filled 2015-07-07: qty 10

## 2015-07-07 MED ORDER — PIPERACILLIN-TAZOBACTAM 3.375 G IVPB 30 MIN
3.3750 g | Freq: Once | INTRAVENOUS | Status: AC
  Administered 2015-07-07: 3.375 g via INTRAVENOUS
  Filled 2015-07-07: qty 50

## 2015-07-07 MED ORDER — SODIUM BICARBONATE 8.4 % IV SOLN
50.0000 meq | Freq: Once | INTRAVENOUS | Status: AC
  Administered 2015-07-07: 50 meq via INTRAVENOUS
  Filled 2015-07-07: qty 50

## 2015-07-07 NOTE — ED Provider Notes (Signed)
[facility] Emergency Department Provider Note  ____________________________________________  Time seen: Approximately 5:25 AM  I have reviewed the triage vital signs and the nursing notes.   HISTORY  Chief Complaint Dehydration; Diarrhea; and Altered Mental Status  Limited by decreased LOC  HPI Douglas Perkins. is a 70 y.o. male presents to the ED from home via EMS with a chief complaint of unresponsive. Patient had a recent admission to this facility for bilateral pneumonia. During his hospitalization, he was noted to be in asystole/long sinus positive switch required CPR. Patient was subsequently transferred to [facility] for permanent pacemaker placement which was done last week. Patient was discharged 07/05/2015 from the hospital. According to the telephone follow-up in patient's chart, he was doing fine after pacemaker placement but feeling overall weak. Wife told EMS she went to bed approximately 11 PM; patient was sitting on the toilet at that time. When she awoke to check on him this morning, he was still sitting on the toilet, unresponsive.EMS reportedly could not obtain a blood pressure nor IV. Further details are not obtainable at this time secondary to patient's decreased LOC.   Past Medical History  Diagnosis Date  . Hypertension   . Glaucoma   . Cancer (HCC)     merkel cell cancer  . Cancer [facility])     prostate  . TIA (transient ischemic attack)   . Bipolar 1 disorder [facility])     Patient Active Problem List   Diagnosis Date Noted  . Bipolar I disorder ([place]) 06/24/2015  . Bilateral pneumonia 06/23/2015  . 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 Bradycardia 02/08/2015  . 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 AF (amaurosis fugax) 09/16/2014  . Benign essential HTN 09/16/2014  . Chest pain 09/16/2014  . Skin cyst 08/06/2012  . Personal history of lymphoma 08/06/2012  . Cancer [facility])     Past Surgical History   Procedure Laterality Date  . Prostate surgery    . Cholecystectomy    . Prostatectomy    . Leg surgery Left     distal  . Skin cancer excision  [id number]    Merkle Cell Carcinoma  . [place] maker placement      Current Outpatient Rx  Name  Route  Sig  Dispense  Refill  . amLODipine (NORVASC) 5 MG tablet   Oral   Take 5 mg by mouth daily.         . ARIPiprazole (ABILIFY) 5 MG tablet   Oral   Take 5 mg by mouth 2 (two) times daily.          Marland Kitchen atorvastatin (LIPITOR) 20 MG tablet   Oral   Take 1 tablet (20 mg total) by mouth daily at 6 PM.         . carbamazepine (TEGRETOL) 200 MG tablet   Oral   Take 600 mg by mouth 2 (two) times daily.         . ferrous sulfate 325 (65 FE) MG tablet   Oral   Take 650 mg by mouth daily with breakfast.         . gabapentin (NEURONTIN) 300 MG capsule   Oral   Take 300 mg by mouth 2 (two) times daily.         Marland Kitchen glucosamine-chondroitin 500-400 MG tablet   Oral   Take 1 tablet by mouth daily.         Marland Kitchen latanoprost (XALATAN) 0.005 % ophthalmic solution  Both Eyes   Place 1 drop into both eyes at bedtime.         Marland Kitchen levofloxacin (LEVAQUIN) 750 MG/150ML SOLN   Intravenous   Inject 150 mLs (750 mg total) into the vein daily.   1000 mL      . lisinopril (PRINIVIL,ZESTRIL) 40 MG tablet   Oral   Take 40 mg by mouth every evening.          . methylPREDNISolone sodium succinate (SOLU-MEDROL) 40 mg/mL injection   Intravenous   Inject 1 mL (40 mg total) into the vein every 12 (twelve) hours.   1 each   0   . Multiple Vitamin (MULTIVITAMIN WITH MINERALS) TABS tablet   Oral   Take 1 tablet by mouth daily.         . Omega-3 Fatty Acids (FISH OIL) 1000 MG CAPS   Oral   Take 1,000 mg by mouth 2 (two) times daily.          . pantoprazole (PROTONIX) 40 MG tablet   Oral   Take 40 mg by mouth 2 (two) times daily.          . QUEtiapine (SEROQUEL) 300 MG tablet   Oral   Take 600 mg by mouth at bedtime.         .  temazepam (RESTORIL) 15 MG capsule   Oral   Take 15 mg by mouth at bedtime as needed for sleep.         Marland Kitchen timolol (BETIMOL) 0.5 % ophthalmic solution   Right Eye   Place 1 drop into the right eye daily.           Allergies Other; Feldene; and Sulfa antibiotics  History reviewed. No pertinent family history.  Social History Social History  Substance Use Topics  . Smoking status: Former Research scientist (life sciences)  . Smokeless tobacco: None  . Alcohol Use: No    Review of Systems  Constitutional: No fever/chills. Eyes: No visual changes. ENT: No sore throat. Cardiovascular: Positive for recent pacemaker placement. Denies chest pain. Respiratory: Denies shortness of breath. Gastrointestinal: No abdominal pain.  No nausea, no vomiting.  No diarrhea.  No constipation. Genitourinary: Negative for dysuria. Musculoskeletal: Negative for back pain. Skin: Negative for rash. Neurological: Negative for headaches, focal weakness or numbness.  Limited by decreased LOC; 10-point ROS otherwise negative.  ____________________________________________   PHYSICAL EXAM:  VITAL SIGNS: ED Triage Vitals  Enc Vitals Group     BP --      Pulse --      Resp --      Temp --      Temp src --      SpO2 --      Weight --      Height --      Head Cir --      Peak Flow --      Pain Score --      Pain Loc --      Pain Edu? --      Excl. in [place]? --     Constitutional: Somnolent. Chronically ill appearing and in moderate acute distress. Eyes: Conjunctivae are normal. PERRL. EOMI. Head: Atraumatic. Nose: No congestion/rhinnorhea. Mouth/Throat: Mucous membranes are moist.  Oropharynx non-erythematous. Neck: No stridor.   Cardiovascular: Normal rate, regular rhythm. Grossly normal heart sounds.  Good peripheral circulation. Respiratory: Normal respiratory effort.  No retractions. Lungs diminished bilaterally. Postop dressing over left anterior chest wall intact. Area is tender to  palpation. Gastrointestinal:  Soft and nontender. No distention. No abdominal bruits. No CVA tenderness. Musculoskeletal: No lower extremity tenderness nor edema.  No joint effusions. Neurologic:  Patient responds to painful stimuli. Is able to form short sentences. Moving all extremities 4. Normal speech and language. No gross focal neurologic deficits are appreciated.  Psychiatric: Mood and affect are normal. Speech and behavior are normal.  ____________________________________________   LABS (all labs ordered are listed, but only abnormal results are displayed)  Labs Reviewed  CBC WITH DIFFERENTIAL/PLATELET - Abnormal; Notable for the following:    RBC 3.50 (*)    Hemoglobin 9.3 (*)    HCT 28.6 (*)    RDW 18.5 (*)    Neutro Abs 8.2 (*)    Lymphs Abs 0.6 (*)    All other components within normal limits  COMPREHENSIVE METABOLIC PANEL - Abnormal; Notable for the following:    Sodium 129 (*)    Potassium 5.7 (*)    Chloride 95 (*)    Glucose, Bld 155 (*)    BUN 24 (*)    Creatinine, Ser 1.51 (*)    Albumin 3.4 (*)    AST 69 (*)    ALT 84 (*)    Total Bilirubin 0.2 (*)    GFR calc non Af Amer 45 (*)    GFR calc Af Amer 52 (*)    All other components within normal limits  TROPONIN I - Abnormal; Notable for the following:    Troponin I 0.06 (*)    All other components within normal limits  BRAIN NATRIURETIC PEPTIDE - Abnormal; Notable for the following:    B Natriuretic Peptide 251.0 (*)    All other components within normal limits  ACETAMINOPHEN LEVEL - Abnormal; Notable for the following:    Acetaminophen (Tylenol), Serum <10 (*)    All other components within normal limits  URINALYSIS COMPLETEWITH MICROSCOPIC ([facility] ONLY) - Abnormal; Notable for the following:    Color, Urine YELLOW (*)    APPearance CLEAR (*)    Hgb urine dipstick 3+ (*)    Protein, ur 30 (*)    Squamous Epithelial / LPF 0-5 (*)    All other components within normal limits  CK - Abnormal; Notable for  the following:    Total CK 749 (*)    All other components within normal limits  BLOOD GAS, ARTERIAL - Abnormal; Notable for the following:    pH, Arterial 7.16 (*)    pCO2 arterial 53 (*)    pO2, Arterial 111 (*)    Bicarbonate 18.9 (*)    Acid-base deficit 10.1 (*)    Allens test (pass/fail) POSITIVE (*)    All other components within normal limits  CULTURE, BLOOD (ROUTINE X 2)  CULTURE, BLOOD (ROUTINE X 2)  URINE CULTURE  LIPASE, BLOOD  SALICYLATE LEVEL  LACTIC ACID, PLASMA   ____________________________________________  EKG  ED ECG REPORT I, SUNG,JADE J, the attending physician, personally viewed and interpreted this ECG.   Date: 07/07/2015  EKG Time: 0519  Rate: 79  Rhythm: normal EKG, normal sinus rhythm  Axis: LAD  Intervals:left anterior fascicular block RBBB  ST&T Change: Nonspecific No change from 06/2015  ____________________________________________  RADIOLOGY  CT head interpreted per Dr. Jeannine Boga: 1. No acute intracranial process. 2. Mild age-related cerebral atrophy with chronic small vessel ischemic disease.  Portable chest x-ray (viewed by me, interpreted per Dr. Gerilyn Nestle): No active disease. ____________________________________________   PROCEDURES  Procedure(s) performed: None  Critical Care performed: Yes, see critical care note(s)  CRITICAL CARE Performed by: Paulette Blanch   Total critical care time: 30 minutes  Critical care time was exclusive of separately billable procedures and treating other patients.  Critical care was necessary to treat or prevent imminent or life-threatening deterioration.  Critical care was time spent personally by me on the following activities: development of treatment plan with patient and/or surrogate as well as nursing, discussions with consultants, evaluation of patient's response to treatment, examination of patient, obtaining history from patient or surrogate, ordering and performing treatments and  interventions, ordering and review of laboratory studies, ordering and review of radiographic studies, pulse oximetry and re-evaluation of patient's condition.  ____________________________________________   INITIAL IMPRESSION / ASSESSMENT AND PLAN / ED COURSE  Pertinent labs & imaging results that were available during my care of the patient were reviewed by me and considered in my medical decision making (see chart for details).  70 year old male recently hospitalized for pneumonia who required transfer to [facility] for permanent pacemaker placement secondary to long sinus pauses requiring CPR who was found unresponsive sitting in the same position on the toilet for the past 6 hours. Patient is responsive to painful stimuli and able to converse in short sentences. He is overall [place] ill-appearing. Will obtain screening lab work including toxicological screen, CT head, urinalysis, blood cultures. Will consider Narcan administration after patient returns from head CT.  ----------------------------------------- 6:35 AM on 07/07/2015 -----------------------------------------  Spouse at bedside. She states patient has not had to take any hydrocodone for quite some time. She denies patient taking extra doses of his psychiatric medications. Denies trauma. States he was sitting in the same spot in the hospital commode which has arms this morning when she found him as when she last saw him last evening approximately 11 PM. Since discharge from [facility] this week, she denied the patient having fever, chest pain, shortness of breath, nausea, vomiting, diarrhea, abdominal pain. Just states he was felt generally weak.  ----------------------------------------- 6:56 AM on 07/07/2015 -----------------------------------------  Updated patient's family members of laboratory and imaging results. Patient continues to awaken to painful stimuli then immediately becomes somnolent. Noted lactic acid within  normal limits. There is no indication via urinalysis or chest x-ray to start antibiotics. Blood and urine cultures are pending. Discussed with hospitalist to evaluate patient in the emergency department for admission. ____________________________________________   FINAL CLINICAL IMPRESSION(S) / ED DIAGNOSES  Final diagnoses:  Somnolence  Hypothermia, initial encounter  Non-traumatic rhabdomyolysis  Bipolar I disorder ([place])  Encephalopathy  Hyponatremia  Hyperkalemia      Paulette Blanch, MD 07/07/15 [phone]

## 2015-07-07 NOTE — ED Notes (Signed)
Pt became conscious after administration of Narcan. Pt reported not feeling well. Dr. Beather Arbour notified.

## 2015-07-07 NOTE — Progress Notes (Signed)
ANTIBIOTIC CONSULT NOTE - INITIAL  Pharmacy Consult for Vancomycin and Zosyn Indication: sepsis  Allergies  Allergen Reactions  . Other Hives, Shortness Of Breath and Other (See Comments)    Pt states that he is allergic to unwashed blood products.    . Feldene [Piroxicam] Hives  . Sulfa Antibiotics Hives    Patient Measurements: Weight: 187 lb 13.3 oz (85.2 kg) Adjusted Body Weight: 80.64 kg  Vital Signs: Temp: 97.7 F (36.5 C) (03/08 0915) Temp Source: Rectal (03/08 0524) BP: 114/59 mmHg (03/08 0915) Pulse Rate: 89 (03/08 0915) Intake/Output from previous day:   Intake/Output from this shift: Total I/O In: -  Out: 30 [Urine:30]  Labs:  Recent Labs  07/07/15 0532  WBC 9.5  HGB 9.3*  PLT 416  CREATININE 1.51*   Estimated Creatinine Clearance: 49.2 mL/min (by C-G formula based on Cr of 1.51). Ke = 0.049, t-1/2 = 14.14 hrs, Vd = 56.7 L.  Microbiology: Recent Results (from the past 720 hour(s))  Rapid Influenza A&B Antigens ([place] only)     Status: None   Collection Time: 06/23/15  7:36 PM  Result Value Ref Range Status   Influenza A [facility]) NOT DETECTED  Final   Influenza B ([facility]) NOT DETECTED  Final  MRSA PCR Screening     Status: None   Collection Time: 06/26/15 11:12 AM  Result Value Ref Range Status   MRSA by PCR NEGATIVE NEGATIVE Final    Comment:        The GeneXpert MRSA Assay (FDA approved for NASAL specimens only), is one component of a comprehensive MRSA colonization surveillance program. It is not intended to diagnose MRSA infection nor to guide or monitor treatment for MRSA infections.     Medical History: Past Medical History  Diagnosis Date  . Hypertension   . Glaucoma   . Cancer (HCC)     merkel cell cancer  . Cancer [facility])     prostate  . TIA (transient ischemic attack)   . Bipolar 1 disorder ([place])   . Presence of permanent cardiac pacemaker     Medications:  Scheduled:  . atorvastatin  20 mg Oral q1800  . calcium  gluconate  1 g Intravenous Once  . carbamazepine  600 mg Oral BID  . gabapentin  300 mg Oral BID  . heparin  5,000 Units Subcutaneous 3 times per day  . latanoprost  1 drop Both Eyes QHS  . pantoprazole  40 mg Oral BID  . piperacillin-tazobactam (ZOSYN)  IV  3.375 g Intravenous Q8H  . QUEtiapine  600 mg Oral QHS  . timolol  1 drop Right Eye Daily  . vancomycin  1,000 mg Intravenous Once  . vancomycin  1,000 mg Intravenous Once  . [START ON 07/08/2015] vancomycin  1,000 mg Intravenous Q18H   Infusions:  . sodium chloride     Assessment: 70 yo male admitted for sepsis evident by fever, hypothermia, hypotension, tachypnea, altered mental status. There is no clear source of infection yet, but patient is very dehydrated and he was recently treated with pneumonia. Broad spectrum abx (Vancomycin and Zosyn) started for presumed HCAP.   Blood and urine culture are sent by ER.  Goal of Therapy:  Vancomycin trough level 15-20 mcg/ml  Plan:  Vancomycin 1g IV given in ED ~ 08:30.  Ordered next dose to be given 6 hours later for stacked dosing at 14:30.  Will continue with Vancomycin 1g IV every 18 hours beginning 3/9 at 08:30.  Trough level to  be drawn prior to 5th dose on 3/10 at 20:00.  Zosyn 3.375g IV given in ED ~ 08:30.  Ordered Zosyn 3.375g EI every 8 hours to begin 3/8 at 16:30.    Follow up culture results  [place] Pharmacist 07/07/2015,9:51 AM

## 2015-07-07 NOTE — ED Notes (Signed)
Lab called about a critical high Troponin 0.06 Dr.Sung notified.

## 2015-07-07 NOTE — ED Notes (Signed)
Pt to go to CT accompanied by Curly Rim and CT tech.

## 2015-07-07 NOTE — Consult Note (Signed)
[facility]  CONSULT NOTE    Date: 07/07/2015                  Patient Name:  Douglas Perkins.  MRN: [id number]  DOB: 1945-10-18  Age / Sex: 70 y.o., male         PCP: [name], MD                 Service Requesting Consult: Dr. Anselm Jungling                 Reason for Consult: Acute renal failure with hyperkalemia            History of Present Illness: Douglas Perkins. is a 70 y.o. white male with hypertension, glaucoma, merkel cell cancer, prostate cancer, TIA, bipolar disorder, AICD, who was admitted to [facility] on 07/07/2015 for Hyperkalemia [E87.5] Somnolence [R40.0] Hyponatremia [E87.1] Encephalopathy [G93.40] Bipolar I disorder ([place]) [F31.9] Hypothermia, initial encounter [T68.XXXA] Non-traumatic rhabdomyolysis [M62.82]   Patient was found unresponsive at home. He was discharged from [facility] on 3/6. Placed on fluid restriction for hyponatremia. Discharged with creatinine of 1.2.    Medications: Outpatient medications: Prescriptions prior to admission  Medication Sig Dispense Refill Last Dose  . amLODipine (NORVASC) 5 MG tablet Take 5 mg by mouth daily.   06/23/2015 at Unknown time  . ARIPiprazole (ABILIFY) 5 MG tablet Take 5 mg by mouth 2 (two) times daily.    06/22/2015 at Unknown time  . atorvastatin (LIPITOR) 20 MG tablet Take 1 tablet (20 mg total) by mouth daily at 6 PM.     . carbamazepine (TEGRETOL) 200 MG tablet Take 600 mg by mouth 2 (two) times daily.   06/23/2015 at Unknown time  . ferrous sulfate 325 (65 FE) MG tablet Take 650 mg by mouth daily with breakfast.   06/23/2015 at Unknown time  . gabapentin (NEURONTIN) 300 MG capsule Take 300 mg by mouth 2 (two) times daily.   06/23/2015 at Unknown time  . glucosamine-chondroitin 500-400 MG tablet Take 1 tablet by mouth daily.   06/23/2015 at Unknown time  . latanoprost (XALATAN) 0.005 % ophthalmic solution Place 1 drop into both eyes at bedtime.   06/22/2015 at Unknown time  . levofloxacin  (LEVAQUIN) 750 MG/150ML SOLN Inject 150 mLs (750 mg total) into the vein daily. 1000 mL    . lisinopril (PRINIVIL,ZESTRIL) 40 MG tablet Take 40 mg by mouth every evening.    06/22/2015 at Unknown time  . methylPREDNISolone sodium succinate (SOLU-MEDROL) 40 mg/mL injection Inject 1 mL (40 mg total) into the vein every 12 (twelve) hours. 1 each 0   . Multiple Vitamin (MULTIVITAMIN WITH MINERALS) TABS tablet Take 1 tablet by mouth daily.   06/23/2015 at Unknown time  . Omega-3 Fatty Acids (FISH OIL) 1000 MG CAPS Take 1,000 mg by mouth 2 (two) times daily.    06/23/2015 at Unknown time  . pantoprazole (PROTONIX) 40 MG tablet Take 40 mg by mouth 2 (two) times daily.    06/23/2015 at Unknown time  . QUEtiapine (SEROQUEL) 300 MG tablet Take 600 mg by mouth at bedtime.   06/22/2015 at Unknown time  . temazepam (RESTORIL) 15 MG capsule Take 15 mg by mouth at bedtime as needed for sleep.   06/22/2015 at Unknown time  . timolol (BETIMOL) 0.5 % ophthalmic solution Place 1 drop into the right eye daily.   06/23/2015 at Unknown time    Current medications: Current Facility-Administered Medications  Medication Dose  Route Frequency Provider Last Rate Last Dose  . 0.9 %  sodium chloride infusion   Intravenous Continuous Vaughan Basta, MD      . atorvastatin (LIPITOR) tablet 20 mg  20 mg Oral q1800 Vaughan Basta, MD      . calcium gluconate 1 g in sodium chloride 0.9 % 100 mL IVPB  1 g Intravenous Once Vaughan Basta, MD   1 g at 07/07/15 0853  . carbamazepine (TEGRETOL) tablet 600 mg  600 mg Oral BID Vaughan Basta, MD   600 mg at 07/07/15 1048  . gabapentin (NEURONTIN) capsule 300 mg  300 mg Oral BID Vaughan Basta, MD   300 mg at 07/07/15 1009  . heparin injection 5,000 Units  5,000 Units Subcutaneous 3 times per day Vaughan Basta, MD   5,000 Units at 07/07/15 1046  . latanoprost (XALATAN) 0.005 % ophthalmic solution 1 drop  1 drop Both Eyes QHS Vaughan Basta, MD       . pantoprazole (PROTONIX) EC tablet 40 mg  40 mg Oral BID Vaughan Basta, MD   40 mg at 07/07/15 1048  . piperacillin-tazobactam (ZOSYN) IVPB 3.375 g  3.375 g Intravenous Q8H Vaughan Basta, MD      . QUEtiapine (SEROQUEL) tablet 600 mg  600 mg Oral QHS Vaughan Basta, MD      . timolol (TIMOPTIC) 0.5 % ophthalmic solution 1 drop  1 drop Right Eye Daily Vaughan Basta, MD      . vancomycin (VANCOCIN) IVPB 1000 mg/200 mL premix  1,000 mg Intravenous Once Vaughan Basta, MD   1,000 mg at 07/07/15 0826  . vancomycin (VANCOCIN) IVPB 1000 mg/200 mL premix  1,000 mg Intravenous Once Vaughan Basta, MD      . Derrill Memo ON 07/08/2015] vancomycin (VANCOCIN) IVPB 1000 mg/200 mL premix  1,000 mg Intravenous Q18H Vaughan Basta, MD          Allergies: Allergies  Allergen Reactions  . Other Hives, Shortness Of Breath and Other (See Comments)    Pt states that he is allergic to unwashed blood products.    . Feldene [Piroxicam] Hives  . Sulfa Antibiotics Hives      Past Medical History: Past Medical History  Diagnosis Date  . Hypertension   . Glaucoma   . Cancer (HCC)     merkel cell cancer  . Cancer [facility])     prostate  . TIA (transient ischemic attack)   . Bipolar 1 disorder ([place])   . Presence of permanent cardiac pacemaker      Past Surgical History: Past Surgical History  Procedure Laterality Date  . Prostate surgery    . Cholecystectomy    . Prostatectomy    . Leg surgery Left     distal  . Skin cancer excision  [id number]    Merkle Cell Carcinoma  . Pace maker placement       Family History: Family History  Problem Relation Age of Onset  . Stroke Father   . CAD Paternal Grandmother   . CAD Paternal Grandfather      Social History: Social History   Social History  . Marital Status: Married    Spouse Name: N/A  . Number of Children: N/A  . Years of Education: N/A   Occupational History  . Not on file.   Social  History Main Topics  . Smoking status: Former Research scientist (life sciences)  . Smokeless tobacco: Not on file  . Alcohol Use: No  . Drug Use: No  . Sexual Activity: Not on  file   Other Topics Concern  . Not on file   Social History Narrative     Review of Systems: Review of Systems  Constitutional: Negative.  Negative for fever, chills, weight loss, malaise/fatigue and diaphoresis.  HENT: Negative.  Negative for congestion, ear discharge, ear pain, hearing loss, nosebleeds, sore throat and tinnitus.   Eyes: Negative.  Negative for blurred vision, double vision, photophobia, pain, discharge and redness.  Respiratory: Negative.  Negative for cough, hemoptysis, sputum production, shortness of breath, wheezing and stridor.   Cardiovascular: Negative.  Negative for chest pain, palpitations, orthopnea, claudication, leg swelling and PND.  Gastrointestinal: Negative.  Negative for heartburn, nausea, vomiting, abdominal pain, diarrhea, constipation, blood in stool and melena.  Genitourinary: Negative.  Negative for dysuria, urgency, frequency, hematuria and flank pain.  Musculoskeletal: Negative.  Negative for myalgias, back pain, joint pain, falls and neck pain.  Skin: Negative.  Negative for itching and rash.  Neurological: Positive for loss of consciousness. Negative for dizziness, tingling, tremors, sensory change, speech change, focal weakness, seizures, weakness and headaches.  Endo/Heme/Allergies: Negative for environmental allergies and polydipsia. Does not bruise/bleed easily.  Psychiatric/Behavioral: Negative.  Negative for depression, suicidal ideas, hallucinations, memory loss and substance abuse. The patient is not nervous/anxious and does not have insomnia.     Vital Signs: Blood pressure 139/64, pulse 87, temperature 97.5 F (36.4 C), temperature source Rectal, resp. rate 22, height 6' (1.829 m), weight 83 kg (182 lb 15.7 oz), SpO2 100 %.  Weight trends: Filed Weights   07/07/15 0524 07/07/15  1000  Weight: 85.2 kg (187 lb 13.3 oz) 83 kg (182 lb 15.7 oz)    Physical Exam: General: NAD, laying in bed  Head: Normocephalic, atraumatic. Moist oral mucosal membranes  Eyes: Anicteric, PERRL  Neck: Supple, trachea midline  Lungs:  Clear to auscultation  Heart: Regular rate and rhythm  Abdomen:  Soft, nontender,   Extremities:  no peripheral edema.  Neurologic: Nonfocal, moving all four extremities  Skin: No lesions  GU: Foley with urine     Lab results: Basic Metabolic Panel:  Recent Labs Lab 07/07/15 0532  NA 129*  K 5.7*  CL 95*  CO2 22  GLUCOSE 155*  BUN 24*  CREATININE 1.51*  CALCIUM 8.9    Liver Function Tests:  Recent Labs Lab 07/07/15 0532  AST 69*  ALT 84*  ALKPHOS 118  BILITOT 0.2*  PROT 7.5  ALBUMIN 3.4*    Recent Labs Lab 07/07/15 0532  LIPASE 30   No results for input(s): AMMONIA in the last 168 hours.  CBC:  Recent Labs Lab 07/07/15 0532  WBC 9.5  NEUTROABS 8.2*  HGB 9.3*  HCT 28.6*  MCV 81.5  PLT 416    Cardiac Enzymes:  Recent Labs Lab 07/07/15 0532  CKTOTAL 749*  TROPONINI 0.06*    BNP: Invalid input(s): POCBNP  CBG:  Recent Labs Lab 07/07/15 0942  GLUCAP 131*    Microbiology: Results for orders placed or performed during the hospital encounter of 07/07/15  Culture, blood (routine x 2)     Status: None (Preliminary result)   Collection Time: 07/07/15  5:32 AM  Result Value Ref Range Status   Specimen Description BLOOD LEFT ANTECUBITAL  Final   Special Requests BOTTLES DRAWN AEROBIC AND ANAEROBIC 5ML  Final   Culture NO GROWTH < 12 HOURS  Final   Report Status PENDING  Incomplete  Culture, blood (routine x 2)     Status: None (Preliminary result)   Collection  Time: 07/07/15  5:33 AM  Result Value Ref Range Status   Specimen Description BLOOD LEFT HAND  Final   Special Requests BOTTLES DRAWN AEROBIC AND ANAEROBIC 5ML  Final   Culture NO GROWTH < 12 HOURS  Final   Report Status PENDING  Incomplete     Coagulation Studies: No results for input(s): LABPROT, INR in the last 72 hours.  Urinalysis:  Recent Labs  07/07/15 0533  COLORURINE YELLOW*  LABSPEC 1.005  PHURINE 8.0  GLUCOSEU NEGATIVE  HGBUR 3+*  BILIRUBINUR NEGATIVE  KETONESUR NEGATIVE  PROTEINUR 30*  NITRITE NEGATIVE  LEUKOCYTESUR NEGATIVE      Imaging: Ct Head Wo Contrast  07/07/2015  CLINICAL DATA:  Initial evaluation for acute unresponsiveness. EXAM: CT HEAD WITHOUT CONTRAST TECHNIQUE: Contiguous axial images were obtained from the base of the skull through the vertex without intravenous contrast. COMPARISON:  Prior study from 11/29/2014. FINDINGS: Diffuse prominence of the CSF containing spaces is compatible with generalized age-related cerebral atrophy. Chronic small vessel ischemic disease present within the periventricular and deep white matter both cerebral hemispheres. No acute intracranial hemorrhage or large vessel territory infarct. No mass lesion, midline shift or mass effect. No hydrocephalus. No extra-axial fluid collection para Scalp soft tissues within normal limits. No acute abnormality about the orbits. Sequela prior lens extraction on the right. Visualized paranasal sinuses are clear.  No mastoid effusion. Calvarium intact. IMPRESSION: 1. No acute intracranial process. 2. Mild age-related cerebral atrophy with chronic small vessel ischemic disease. Electronically Signed   By: Jeannine Boga M.D.   On: 07/07/2015 06:13   Dg Chest Port 1 View  07/07/2015  CLINICAL DATA:  Altered mental status.  Unresponsive. EXAM: PORTABLE CHEST 1 VIEW COMPARISON:  06/25/2015 FINDINGS: Shallow inspiration. Heart size and pulmonary vascularity are normal for technique. Since the previous study, there has been interval placement of a cardiac pacemaker. No pneumothorax. Interval improvement of previous upper lung infiltrates. No focal consolidation or airspace disease today. No blunting of costophrenic angles. Tortuous aorta.  IMPRESSION: No active disease. Electronically Signed   By: Lucienne Capers M.D.   On: 07/07/2015 06:00      Assessment & Plan: [name]. is a 70 y.o. white male with hypertension, glaucoma, merkel cell cancer, prostate cancer, TIA, bipolar disorder, AICD, who was admitted to [facility] on 07/07/2015  1. Acute renal failure 2. Hyperkalemia 3. Hyponatremia 4. Hypertension  Holding lisinopril. Agree with IV NS.  Will monitor serum sodium closely, q 6 hours.     LOS: 0 Douglas Perkins 3/8/201711:27 AM

## 2015-07-07 NOTE — Progress Notes (Signed)
Report has been called to Butch Penny, RN on 1C.  There is no tele box available at this time on 1C therefore Kennyth Lose, RN-AC was notified and when one is available then patient can be moved. Report given to Sharyn Lull, RN in ICU who will care for patient until patient is transferred to 1C.
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
## 2015-07-07 NOTE — ED Notes (Signed)
Admitting MD at bedside.

## 2015-07-07 NOTE — ED Notes (Signed)
Dr. Beather Arbour is at bed side talking to the Pt's family.

## 2015-07-07 NOTE — Progress Notes (Signed)
RN notified Dr. Anselm Jungling that patient's blood pressure is going up, most recent 170/63 and diet ordered by Dr. Mortimer Fries.  Dr. Anselm Jungling gave order for metoprolol and norvasc per home med list and PRN hydralazine.

## 2015-07-07 NOTE — H&P (Signed)
[facility] [place] NAME: Keneth Etchells    MR#:  [id number]  DATE OF BIRTH:  1946-04-16  DATE OF ADMISSION:  07/07/2015  PRIMARY CARE PHYSICIAN: [name], MD   REQUESTING/REFERRING PHYSICIAN: Lurline Hare  CHIEF COMPLAINT:   Chief Complaint  Patient presents with  . Dehydration  . Diarrhea  . Altered Mental Status    HISTORY OF PRESENT ILLNESS: Hermann Salyers  is a 70 y.o. male with a known history of hypertension, glaucoma, cancer, TIA, bipolar disorder, admission in February 2017 for bilateral pneumonia and also found to have bradycardia and sinus pauses was referred to [facility] for pacemaker placement which he received on third of March and he was discharged on sixth of March from [facility] with home health services. Cardiac catheterization was also done before pacemaker placement at [facility] and it was without any significant blockages. This history is obtained from patient's wife on phone as patient is obtunded and drowsy not able to give me any history.  As per wife after discharge he was doing fine he had a physical therapist came yesterday at home and then he was able to walk from bedroom to the living room. Though he was very weak than his baseline. At 9 in the night she left him in the bedroom in bed. Around 11 in the night when she woke up she saw the light on and off his room so she went to check on and he was sitting on the bedside commode. She left him and went to her room and around 3:00 when she woke up she saw the light was still on so he went into his room and he was still sitting on the commode, so she tried to ask him to talk to him that if everything okay but he did not respond he did not wake up. She tried to wake him up for 10-15 minutes but because of nonresponse, she called her causing who lives nearby she also came and they tried to wake him up for 10-15 minutes more, although he was doing was just opening his eyes only up to  half but then going back to sleep and was not waking up so they finally decided to call EMS.  As per EMS his pressure was low and he was unarousable so he was brought to emergency room. In ER he was noted to have hypothermia, his blood pressure was satisfactory initially and his initial workup showed no source of infection so he was not started on any antibiotics.  Later when I came to see him his pressure started to drop now so I started on bolus, his lactic acid is 1.8, his renal function is much worse than it was 2 weeks ago was here, and CK level is elevated. The patient is still obtunded and just opens eyes and moans on any stimuli so not able to give me any further history. He is on a warm air blanket currently.  PAST MEDICAL HISTORY:   Past Medical History  Diagnosis Date  . Hypertension   . Glaucoma   . Cancer (HCC)     merkel cell cancer  . Cancer [facility])     prostate  . TIA (transient ischemic attack)   . Bipolar 1 disorder ([place])   . Presence of permanent cardiac pacemaker     PAST SURGICAL HISTORY:  Past Surgical History  Procedure Laterality Date  . Prostate surgery    . Cholecystectomy    .  Prostatectomy    . Leg surgery Left     distal  . Skin cancer excision  [id number]    Merkle Cell Carcinoma  . Pace maker placement      SOCIAL HISTORY:  Social History  Substance Use Topics  . Smoking status: Former Research scientist (life sciences)  . Smokeless tobacco: Not on file  . Alcohol Use: No    FAMILY HISTORY:  Family History  Problem Relation Age of Onset  . Stroke Father   . CAD Paternal Grandmother   . CAD Paternal Grandfather     DRUG ALLERGIES:  Allergies  Allergen Reactions  . Other Hives, Shortness Of Breath and Other (See Comments)    Pt states that he is allergic to unwashed blood products.    . Feldene [Piroxicam] Hives  . Sulfa Antibiotics Hives    REVIEW OF SYSTEMS:   Pt is drowsy and obtunded, not able to give ROS.  MEDICATIONS AT HOME:  Prior to Admission  medications   Medication Sig Start Date End Date Taking? Authorizing Provider  amLODipine (NORVASC) 5 MG tablet Take 5 mg by mouth daily.    Historical Provider, MD  ARIPiprazole (ABILIFY) 5 MG tablet Take 5 mg by mouth 2 (two) times daily.     Historical Provider, MD  atorvastatin (LIPITOR) 20 MG tablet Take 1 tablet (20 mg total) by mouth daily at 6 PM. 06/27/15   Henreitta Leber, MD  carbamazepine (TEGRETOL) 200 MG tablet Take 600 mg by mouth 2 (two) times daily.    Historical Provider, MD  ferrous sulfate 325 (65 FE) MG tablet Take 650 mg by mouth daily with breakfast.    Historical Provider, MD  gabapentin (NEURONTIN) 300 MG capsule Take 300 mg by mouth 2 (two) times daily.    Historical Provider, MD  glucosamine-chondroitin 500-400 MG tablet Take 1 tablet by mouth daily.    Historical Provider, MD  latanoprost (XALATAN) 0.005 % ophthalmic solution Place 1 drop into both eyes at bedtime.    Historical Provider, MD  levofloxacin (LEVAQUIN) 750 MG/150ML SOLN Inject 150 mLs (750 mg total) into the vein daily. 06/27/15   Henreitta Leber, MD  lisinopril (PRINIVIL,ZESTRIL) 40 MG tablet Take 40 mg by mouth every evening.     Historical Provider, MD  methylPREDNISolone sodium succinate (SOLU-MEDROL) 40 mg/mL injection Inject 1 mL (40 mg total) into the vein every 12 (twelve) hours. 06/27/15   Henreitta Leber, MD  Multiple Vitamin (MULTIVITAMIN WITH MINERALS) TABS tablet Take 1 tablet by mouth daily.    Historical Provider, MD  Omega-3 Fatty Acids (FISH OIL) 1000 MG CAPS Take 1,000 mg by mouth 2 (two) times daily.     Historical Provider, MD  pantoprazole (PROTONIX) 40 MG tablet Take 40 mg by mouth 2 (two) times daily.     Historical Provider, MD  QUEtiapine (SEROQUEL) 300 MG tablet Take 600 mg by mouth at bedtime.    Historical Provider, MD  temazepam (RESTORIL) 15 MG capsule Take 15 mg by mouth at bedtime as needed for sleep.    Historical Provider, MD  timolol (BETIMOL) 0.5 % ophthalmic solution Place  1 drop into the right eye daily.    Historical Provider, MD      PHYSICAL EXAMINATION:   VITAL SIGNS: Blood pressure 97/56, pulse 81, temperature 97.4 F (36.3 C), temperature source Rectal, resp. rate 20, weight 85.2 kg (187 lb 13.3 oz), SpO2 98 %.  GENERAL:  70 y.o.-year-old patient lying in the bed with no acute distress. Critically  ill appearing. EYES: Pupils equal, round, reactive to light . No scleral icterus. Extraocular muscles intact.  HEENT: Head atraumatic, normocephalic. Oropharynx and nasopharynx clear.  NECK:  Supple, no jugular venous distention. No thyroid enlargement, no tenderness.  LUNGS: Normal breath sounds bilaterally, no wheezing,  no crepitation. No use of accessory muscles of respiration.  CARDIOVASCULAR: S1, S2 normal. No murmurs, rubs, or gallops. Pace maker on left chest present- with a dressing on top. ABDOMEN: Soft, nontender, nondistended. Bowel sounds present. No organomegaly or mass.  EXTREMITIES: No pedal edema, cyanosis, or clubbing. Both groin- dressing present.- possibly of the femoral arterial access site. NEUROLOGIC: Vision is drowsy and obtunded, he barely respond by opening half eyes and moaning some on my stimuli. He moves his limbs slightly to the stimuli, both his legs are half flexed and he is laying on his side when I try to straighten his legs from his thighs he has morning and he is not letting me straighten his legs.  PSYCHIATRIC: As above. SKIN: No obvious rash, lesion, or ulcer.   LABORATORY PANEL:   CBC  Recent Labs Lab 07/07/15 0532  WBC 9.5  HGB 9.3*  HCT 28.6*  PLT 416  MCV 81.5  MCH 26.7  MCHC 32.7  RDW 18.5*  LYMPHSABS 0.6*  MONOABS 0.6  EOSABS 0.0  BASOSABS 0.0   ------------------------------------------------------------------------------------------------------------------  Chemistries   Recent Labs Lab 07/07/15 0532  NA 129*  K 5.7*  CL 95*  CO2 22  GLUCOSE 155*  BUN 24*  CREATININE 1.51*  CALCIUM  8.9  AST 69*  ALT 84*  ALKPHOS 118  BILITOT 0.2*   ------------------------------------------------------------------------------------------------------------------ estimated creatinine clearance is 49.2 mL/min (by C-G formula based on Cr of 1.51). ------------------------------------------------------------------------------------------------------------------ No results for input(s): TSH, T4TOTAL, T3FREE, THYROIDAB in the last 72 hours.  Invalid input(s): FREET3   Coagulation profile No results for input(s): INR, PROTIME in the last 168 hours. ------------------------------------------------------------------------------------------------------------------- No results for input(s): DDIMER in the last 72 hours. -------------------------------------------------------------------------------------------------------------------  Cardiac Enzymes  Recent Labs Lab 07/07/15 0532  TROPONINI 0.06*   ------------------------------------------------------------------------------------------------------------------ Invalid input(s): POCBNP  ---------------------------------------------------------------------------------------------------------------  Urinalysis    Component Value Date/Time   COLORURINE YELLOW* 07/07/2015 0533   APPEARANCEUR CLEAR* 07/07/2015 0533   LABSPEC 1.005 07/07/2015 0533   PHURINE 8.0 07/07/2015 0533   GLUCOSEU NEGATIVE 07/07/2015 0533   HGBUR 3+* 07/07/2015 0533   BILIRUBINUR NEGATIVE 07/07/2015 0533   BILIRUBINUR Negative 05/24/2015 1328   KETONESUR NEGATIVE 07/07/2015 0533   PROTEINUR 30* 07/07/2015 0533   NITRITE NEGATIVE 07/07/2015 0533   NITRITE Negative 05/24/2015 1328   LEUKOCYTESUR NEGATIVE 07/07/2015 0533   LEUKOCYTESUR Negative 05/24/2015 1328     RADIOLOGY: Ct Head Wo Contrast  07/07/2015  CLINICAL DATA:  Initial evaluation for acute unresponsiveness. EXAM: CT HEAD WITHOUT CONTRAST TECHNIQUE: Contiguous axial images were obtained from  the base of the skull through the vertex without intravenous contrast. COMPARISON:  Prior study from 11/29/2014. FINDINGS: Diffuse prominence of the CSF containing spaces is compatible with generalized age-related cerebral atrophy. Chronic small vessel ischemic disease present within the periventricular and deep white matter both cerebral hemispheres. No acute intracranial hemorrhage or large vessel territory infarct. No mass lesion, midline shift or mass effect. No hydrocephalus. No extra-axial fluid collection para Scalp soft tissues within normal limits. No acute abnormality about the orbits. Sequela prior lens extraction on the right. Visualized paranasal sinuses are clear.  No mastoid effusion. Calvarium intact. IMPRESSION: 1. No acute intracranial process. 2. Mild age-related cerebral atrophy with chronic  small vessel ischemic disease. Electronically Signed   By: Jeannine Boga M.D.   On: 07/07/2015 06:13   Dg Chest Port 1 View  07/07/2015  CLINICAL DATA:  Altered mental status.  Unresponsive. EXAM: PORTABLE CHEST 1 VIEW COMPARISON:  06/25/2015 FINDINGS: Shallow inspiration. Heart size and pulmonary vascularity are normal for technique. Since the previous study, there has been interval placement of a cardiac pacemaker. No pneumothorax. Interval improvement of previous upper lung infiltrates. No focal consolidation or airspace disease today. No blunting of costophrenic angles. Tortuous aorta. IMPRESSION: No active disease. Electronically Signed   By: Lucienne Capers M.D.   On: 07/07/2015 06:00    EKG: Orders placed or performed during the hospital encounter of 07/07/15  . EKG 12-Lead  . EKG 12-Lead    IMPRESSION AND PLAN:  * Sepsis   This is evident by fever, hypothermia, hypotension, tachypnea, altered mental status.   There is no clear source of infection yet, but patient is very dehydrated and he was recently treated with pneumonia.   So I will resume this is secondary to pneumonia and  will start him on treatment for that, with broad-spectrum antibiotics.   Blood and urine culture are sent by ER.   He is on warming blanket.  * Healthcare associated pneumonia likely  Treat with broad-spectrum antibiotic currently, and keep checking for any other source of infection.  * Altered mental status   Likely metabolic encephalopathy secondary to sepsis. CT head is negative.   Patient is severely acidotic.   Will keep checking her improvement after initial fluid resuscitation and bringing his temperature to normal.  * Hypo thermia   This is likely as a part of sepsis, he is on warm air blanket.  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  * Metabolic acidosis   Currently unknown reason.   May be a combination of sepsis, dehydration, rhabdomyolysis.   Currently we will give IV fluids, and get nephrology consult.  * Acute renal failure   Most likely due to dehydration and sepsis, continue IV fluid, follow intake and output by catheterization, get nephrology consult.  * Hyperkalemia   Likely secondary to CVA acidosis and renal failure,   I will give him IV fluid and monitor BMP in next few hours.   As with possible correction of acidosis there might be in transcellular intake of potassium, so I would like not to give Kayexalate at this point.   Give calcium gluconate one time now, and continue cardiac monitoring in ICU.  * Rhabdomyolysis   CK level is elevated, continue IV fluid and monitor.  * Recent pacemaker placement,    This was [facility] 4 [facility] ago at [facility].   Will keep monitoring, and check blood culture.   If needed will call cardiology consult.  All the records are reviewed and case discussed with ED provider. Management plans discussed with the patient, family and they are in agreement.  CODE STATUS: Full code Code Status History    Date Active Date  Inactive Code Status Order ID Comments User Context   06/23/2015 10:42 PM 06/27/2015  7:28 PM Full Code [id number]  Nicholes Mango, MD ED    Advance Directive Documentation        Most Recent Value   Type of Advance Directive  Living will   Pre-existing out of facility DNR order (yellow form or pink MOST  form)     "MOST" Form in Place?       Condition is very critical at this point because of severe acidosis and sepsis, will monitor in ICU. I discussed the findings and the plan with the patient's wife on phone who is also his healthcare power of attorney, she understand and agree with the plan.     TOTAL TIME TAKING CARE OF THIS PATIENT: 60 critical care minutes.    Vaughan Basta M.D on 07/07/2015   Between 7am to 6pm - Pager - [phone]  After 6pm go to [email or web address] - password [email or web address] [email or web address]  Office  [phone]  CC: Primary care physician; [name], MD   Note: This dictation was prepared with Dragon dictation along with smaller phrase technology. Any transcriptional errors that result from this process are unintentional.

## 2015-07-07 NOTE — Progress Notes (Signed)
Report called to Butch Penny, RN on 1C.

## 2015-07-07 NOTE — Progress Notes (Signed)
Per Dr. Zoila Shutter verbal orders placed order for pt to transfer to any med surg unit with off unit telemetry

## 2015-07-07 NOTE — ED Notes (Signed)
Pt arrived to the ED via EMS from home for altered mental status. According to EMS the Pt's wife reported that the Pt spend the night sitting in the bathroom for possible diarrhea. EMS reported that they were not able to obtain a BP and that the Pt only responded to painful stimulation. Pt is alert but not oriented with faint radial pulses. Pt had a pace maker placed recently.

## 2015-07-07 NOTE — Progress Notes (Signed)
RN made Dr. Juleen China aware of most recent Na result of 134 and MD gave order to cancel q6H sodium lab draws and to make sure metB was ordered for the morning.

## 2015-07-07 NOTE — ED Notes (Signed)
Pt placed on the Bair Hugger to bring up the Pt's core temperature.

## 2015-07-08 LAB — CBC
HCT: 30.1 % — ABNORMAL LOW (ref 40.0–52.0)
Hemoglobin: 9.9 g/dL — ABNORMAL LOW (ref 13.0–18.0)
MCH: 27.1 pg (ref 26.0–34.0)
MCHC: 32.9 g/dL (ref 32.0–36.0)
MCV: 82.3 fL (ref 80.0–100.0)
PLATELETS: 402 10*3/uL (ref 150–440)
RBC: 3.66 MIL/uL — AB (ref 4.40–5.90)
RDW: 18.4 % — AB (ref 11.5–14.5)
WBC: 8 10*3/uL (ref 3.8–10.6)

## 2015-07-08 LAB — BASIC METABOLIC PANEL
Anion gap: 4 — ABNORMAL LOW (ref 5–15)
BUN: 18 mg/dL (ref 6–20)
CALCIUM: 8.1 mg/dL — AB (ref 8.9–10.3)
CO2: 28 mmol/L (ref 22–32)
Chloride: 97 mmol/L — ABNORMAL LOW (ref 101–111)
Creatinine, Ser: 0.84 mg/dL (ref 0.61–1.24)
GFR calc non Af Amer: >60 mL/min (ref >60)
Glucose, Bld: 98 mg/dL (ref 65–99)
Potassium: 4.5 mmol/L (ref 3.5–5.1)
Sodium: 129 mmol/L — ABNORMAL LOW (ref 135–145)

## 2015-07-08 LAB — URINE CULTURE
CULTURE: NO GROWTH
SPECIAL REQUESTS: NORMAL

## 2015-07-08 LAB — CK: CK TOTAL: 4085 U/L — AB (ref 49–397)

## 2015-07-08 LAB — GLUCOSE, CAPILLARY: GLUCOSE-CAPILLARY: 128 mg/dL — AB (ref 65–99)

## 2015-07-08 MED ORDER — ACETAMINOPHEN 325 MG PO TABS
325.0000 mg | ORAL_TABLET | Freq: Once | ORAL | Status: AC
  Administered 2015-07-08: 23:00:00 325 mg via ORAL
  Filled 2015-07-08: qty 1

## 2015-07-08 MED ORDER — ACETAMINOPHEN 325 MG PO TABS
650.0000 mg | ORAL_TABLET | Freq: Four times a day (QID) | ORAL | Status: DC | PRN
  Administered 2015-07-08 – 2015-07-11 (×5): 650 mg via ORAL
  Filled 2015-07-08 (×5): qty 2

## 2015-07-08 MED ORDER — CALCIUM CARBONATE ANTACID 500 MG PO CHEW
1.0000 | CHEWABLE_TABLET | Freq: Three times a day (TID) | ORAL | Status: DC | PRN
  Administered 2015-07-08 – 2015-07-09 (×3): 200 mg via ORAL
  Filled 2015-07-08 (×4): qty 1

## 2015-07-08 MED ORDER — ARIPIPRAZOLE 5 MG PO TABS
5.0000 mg | ORAL_TABLET | Freq: Two times a day (BID) | ORAL | Status: DC
  Administered 2015-07-08 – 2015-07-12 (×8): 5 mg via ORAL
  Filled 2015-07-08 (×8): qty 1

## 2015-07-08 MED ORDER — DABIGATRAN ETEXILATE MESYLATE 75 MG PO CAPS
150.0000 mg | ORAL_CAPSULE | Freq: Two times a day (BID) | ORAL | Status: DC
  Administered 2015-07-09 – 2015-07-12 (×7): 150 mg via ORAL
  Filled 2015-07-08 (×8): qty 2

## 2015-07-08 MED ORDER — SODIUM CHLORIDE 0.9 % IV SOLN
INTRAVENOUS | Status: DC
  Administered 2015-07-08 – 2015-07-11 (×7): via INTRAVENOUS

## 2015-07-08 NOTE — Progress Notes (Signed)
[facility]  ROUNDING NOTE   Subjective:   Feeling better. Transferred to floor.   Objective:  Vital signs in last 24 hours:  Temp:  [97.2 F (36.2 C)-98.4 F (36.9 C)] 98 F (36.7 C) (03/09 0925) Pulse Rate:  [69-101] 73 (03/09 0925) Resp:  [13-22] 20 (03/09 0925) BP: (114-172)/(56-91) 149/63 mmHg (03/09 0925) SpO2:  [95 %-100 %] 95 % (03/09 0925) Weight:  [83 kg (182 lb 15.7 oz)] 83 kg (182 lb 15.7 oz) (03/08 1000)  Weight change: -2.2 kg (-4 lb 13.6 oz) Filed Weights   07/07/15 0524 07/07/15 1000  Weight: 85.2 kg (187 lb 13.3 oz) 83 kg (182 lb 15.7 oz)    Intake/Output: I/O last 3 completed shifts: In: [id number] [P.O.:570; I.V.:1000; IV Piggyback:200] Out: 2480 [Urine:2480]   Intake/Output this shift:  Total I/O In: 240 [P.O.:240] Out: 450 [Urine:450]  Physical Exam: General: NAD,   Head: Normocephalic, atraumatic. Moist oral mucosal membranes  Eyes: Anicteric, PERRL  Neck: Supple, trachea midline  Lungs:  Clear to auscultation  Heart: Regular rate and rhythm  Abdomen:  Soft, nontender,   Extremities: no peripheral edema.  Neurologic: Nonfocal, moving all four extremities  Skin: No lesions       Basic Metabolic Panel:  Recent Labs Lab 07/07/15 0532 07/07/15 1038 07/07/15 1751 07/08/15 0514  NA 129* 133* 134* 129*  K 5.7* 4.4  --  4.5  CL 95* 101  --  97*  CO2 22 23  --  28  GLUCOSE 155* 121*  --  98  BUN 24* 23*  --  18  CREATININE 1.51* 1.31*  --  0.84  CALCIUM 8.9 8.2*  --  8.1*    Liver Function Tests:  Recent Labs Lab 07/07/15 0532  AST 69*  ALT 84*  ALKPHOS 118  BILITOT 0.2*  PROT 7.5  ALBUMIN 3.4*    Recent Labs Lab 07/07/15 0532  LIPASE 30   No results for input(s): AMMONIA in the last 168 hours.  CBC:  Recent Labs Lab 07/07/15 0532 07/08/15 0514  WBC 9.5 8.0  NEUTROABS 8.2*  --   HGB 9.3* 9.9*  HCT 28.6* 30.1*  MCV 81.5 82.3  PLT 416 402    Cardiac Enzymes:  Recent Labs Lab 07/07/15 0532   CKTOTAL 749*  TROPONINI 0.06*    BNP: Invalid input(s): POCBNP  CBG:  Recent Labs Lab 07/07/15 0942  GLUCAP 131*    Microbiology: Results for orders placed or performed during the hospital encounter of 07/07/15  Culture, blood (routine x 2)     Status: None (Preliminary result)   Collection Time: 07/07/15  5:32 AM  Result Value Ref Range Status   Specimen Description BLOOD LEFT ANTECUBITAL  Final   Special Requests BOTTLES DRAWN AEROBIC AND ANAEROBIC 5ML  Final   Culture NO GROWTH < 12 HOURS  Final   Report Status PENDING  Incomplete  Culture, blood (routine x 2)     Status: None (Preliminary result)   Collection Time: 07/07/15  5:33 AM  Result Value Ref Range Status   Specimen Description BLOOD LEFT HAND  Final   Special Requests BOTTLES DRAWN AEROBIC AND ANAEROBIC 5ML  Final   Culture NO GROWTH < 12 HOURS  Final   Report Status PENDING  Incomplete    Coagulation Studies: No results for input(s): LABPROT, INR in the last 72 hours.  Urinalysis:  Recent Labs  07/07/15 0533  COLORURINE YELLOW*  LABSPEC 1.005  PHURINE 8.0  GLUCOSEU NEGATIVE  HGBUR  3+*  BILIRUBINUR NEGATIVE  KETONESUR NEGATIVE  PROTEINUR 30*  NITRITE NEGATIVE  LEUKOCYTESUR NEGATIVE      Imaging: Ct Head Wo Contrast  07/07/2015  CLINICAL DATA:  Initial evaluation for acute unresponsiveness. EXAM: CT HEAD WITHOUT CONTRAST TECHNIQUE: Contiguous axial images were obtained from the base of the skull through the vertex without intravenous contrast. COMPARISON:  Prior study from 11/29/2014. FINDINGS: Diffuse prominence of the CSF containing spaces is compatible with generalized age-related cerebral atrophy. Chronic small vessel ischemic disease present within the periventricular and deep white matter both cerebral hemispheres. No acute intracranial hemorrhage or large vessel territory infarct. No mass lesion, midline shift or mass effect. No hydrocephalus. No extra-axial fluid collection para Scalp soft  tissues within normal limits. No acute abnormality about the orbits. Sequela prior lens extraction on the right. Visualized paranasal sinuses are clear.  No mastoid effusion. Calvarium intact. IMPRESSION: 1. No acute intracranial process. 2. Mild age-related cerebral atrophy with chronic small vessel ischemic disease. Electronically Signed   By: Jeannine Boga M.D.   On: 07/07/2015 06:13   Dg Chest Port 1 View  07/07/2015  CLINICAL DATA:  Altered mental status.  Unresponsive. EXAM: PORTABLE CHEST 1 VIEW COMPARISON:  06/25/2015 FINDINGS: Shallow inspiration. Heart size and pulmonary vascularity are normal for technique. Since the previous study, there has been interval placement of a cardiac pacemaker. No pneumothorax. Interval improvement of previous upper lung infiltrates. No focal consolidation or airspace disease today. No blunting of costophrenic angles. Tortuous aorta. IMPRESSION: No active disease. Electronically Signed   By: Lucienne Capers M.D.   On: 07/07/2015 06:00     Medications:   . sodium chloride 50 mL/hr at 07/08/15 0745   . amLODipine  5 mg Oral Daily  . atorvastatin  20 mg Oral q1800  . calcium gluconate  1 g Intravenous Once  . carbamazepine  600 mg Oral BID  . gabapentin  300 mg Oral BID  . heparin  5,000 Units Subcutaneous 3 times per day  . latanoprost  1 drop Both Eyes QHS  . metoprolol succinate  50 mg Oral Daily  . pantoprazole  40 mg Oral BID  . QUEtiapine  600 mg Oral QHS  . timolol  1 drop Right Eye Daily  . vancomycin  1,000 mg Intravenous Once   acetaminophen, hydrALAZINE  Assessment/ Plan:  Douglas Perkins. is a 70 y.o. white male with hypertension, glaucoma, merkel cell cancer, prostate cancer, TIA, bipolar disorder, AICD, who was admitted to [facility] on 07/07/2015  1. Acute renal failure 2. Hyperkalemia 3. Hyponatremia 4. Hypertension  Holding lisinopril. Agree with IV NS. Reduced to 76mL/hr Will monitor serum sodium closely   LOS:  1 [name] 3/9/20179:50 AM

## 2015-07-08 NOTE — Progress Notes (Signed)
[facility] [facility] [place] NAME: Douglas Perkins    MR#:  [id number]  DATE OF BIRTH:  1945/11/28  SUBJECTIVE:  CHIEF COMPLAINT:   Chief Complaint  Patient presents with  . Dehydration  . Diarrhea  . Altered Mental Status     Completely alert and oriented today.   No complains.  REVIEW OF SYSTEMS:  CONSTITUTIONAL: No fever, fatigue or weakness.  EYES: No blurred or double vision.  EARS, NOSE, AND THROAT: No tinnitus or ear pain.  RESPIRATORY: No cough, shortness of breath, wheezing or hemoptysis.  CARDIOVASCULAR: No chest pain, orthopnea, edema.  GASTROINTESTINAL: No nausea, vomiting, diarrhea or abdominal pain.  GENITOURINARY: No dysuria, hematuria.  ENDOCRINE: No polyuria, nocturia,  HEMATOLOGY: No anemia, easy bruising or bleeding SKIN: No rash or lesion. MUSCULOSKELETAL: No joint pain or arthritis.   NEUROLOGIC: No tingling, numbness, weakness.  PSYCHIATRY: No anxiety or depression.   ROS  DRUG ALLERGIES:   Allergies  Allergen Reactions  . Other Hives, Shortness Of Breath and Other (See Comments)    Pt states that he is allergic to unwashed blood products.    . Feldene [Piroxicam] Hives  . Sulfa Antibiotics Hives    VITALS:  Blood pressure 122/82, pulse 73, temperature 98 F (36.7 C), temperature source Oral, resp. rate 20, height 6' (1.829 m), weight 83 kg (182 lb 15.7 oz), SpO2 100 %.  PHYSICAL EXAMINATION:  GENERAL:  70 y.o.-year-old patient lying in the bed with no acute distress.  EYES: Pupils equal, round, reactive to light and accommodation. No scleral icterus. Extraocular muscles intact.  HEENT: Head atraumatic, normocephalic. Oropharynx and nasopharynx clear.  NECK:  Supple, no jugular venous distention. No thyroid enlargement, no tenderness.  LUNGS: Normal breath sounds bilaterally, no wheezing, rales,rhonchi or crepitation. No use of accessory muscles of respiration.  CARDIOVASCULAR: S1, S2 normal. No  murmurs, rubs, or gallops. Left upper chest PPM present.  ABDOMEN: Soft, nontender, nondistended. Bowel sounds present. No organomegaly or mass. Foley in place. EXTREMITIES: No pedal edema, cyanosis, or clubbing.  NEUROLOGIC: Cranial nerves II through XII are intact. Muscle strength 5/5 in all extremities. Sensation intact. Gait not checked.  PSYCHIATRIC: The patient is alert and oriented x 3.  SKIN: No obvious rash, lesion, or ulcer.   Physical Exam LABORATORY PANEL:   CBC  Recent Labs Lab 07/08/15 0514  WBC 8.0  HGB 9.9*  HCT 30.1*  PLT 402   ------------------------------------------------------------------------------------------------------------------  Chemistries   Recent Labs Lab 07/07/15 0532  07/08/15 0514  NA 129*  < > 129*  K 5.7*  < > 4.5  CL 95*  < > 97*  CO2 22  < > 28  GLUCOSE 155*  < > 98  BUN 24*  < > 18  CREATININE 1.51*  < > 0.84  CALCIUM 8.9  < > 8.1*  AST 69*  --   --   ALT 84*  --   --   ALKPHOS 118  --   --   BILITOT 0.2*  --   --   < > = values in this interval not displayed. ------------------------------------------------------------------------------------------------------------------  Cardiac Enzymes  Recent Labs Lab 07/07/15 0532  TROPONINI 0.06*   ------------------------------------------------------------------------------------------------------------------  RADIOLOGY:  Ct Head Wo Contrast  07/07/2015  CLINICAL DATA:  Initial evaluation for acute unresponsiveness. EXAM: CT HEAD WITHOUT CONTRAST TECHNIQUE: Contiguous axial images were obtained from the base of the skull through the vertex without intravenous contrast. COMPARISON:  Prior study from 11/29/2014. FINDINGS:  Diffuse prominence of the CSF containing spaces is compatible with generalized age-related cerebral atrophy. Chronic small vessel ischemic disease present within the periventricular and deep white matter both cerebral hemispheres. No acute intracranial hemorrhage or  large vessel territory infarct. No mass lesion, midline shift or mass effect. No hydrocephalus. No extra-axial fluid collection para Scalp soft tissues within normal limits. No acute abnormality about the orbits. Sequela prior lens extraction on the right. Visualized paranasal sinuses are clear.  No mastoid effusion. Calvarium intact. IMPRESSION: 1. No acute intracranial process. 2. Mild age-related cerebral atrophy with chronic small vessel ischemic disease. Electronically Signed   By: Jeannine Boga M.D.   On: 07/07/2015 06:13   Dg Chest Port 1 View  07/07/2015  CLINICAL DATA:  Altered mental status.  Unresponsive. EXAM: PORTABLE CHEST 1 VIEW COMPARISON:  06/25/2015 FINDINGS: Shallow inspiration. Heart size and pulmonary vascularity are normal for technique. Since the previous study, there has been interval placement of a cardiac pacemaker. No pneumothorax. Interval improvement of previous upper lung infiltrates. No focal consolidation or airspace disease today. No blunting of costophrenic angles. Tortuous aorta. IMPRESSION: No active disease. Electronically Signed   By: Lucienne Capers M.D.   On: 07/07/2015 06:00    ASSESSMENT AND PLAN:   Active Problems:   Sepsis ([place])  * sepsis- suspected on admission, but seem like it was dehydration.   Much better now, Cx negative, Off Abx, no fever.  * HCAP suspected on admission- ruled out.  * Altered mental status   Due to metabolic encephalopathy   Much improved after IV hydration and improvement in the renal function.   Completely alert and oriented.  * Hyponatremia   It appears chronic, now improved with IV fluid.  * Metabolic acidosis   Likely due to dehydration, corrected after IV fluid.  * Acute renal failure   Corrected now with IV fluid.   Appreciated nephrology consult.  * Hyperkalemia   Corrected.  * Rhabdomyolysis   CK he is even higher today, increased IV fluid, recheck tomorrow.    We will hold statin for now.  All  the records are reviewed and case discussed with Care Management/Social Workerr. Management plans discussed with the patient, family and they are in agreement.  CODE STATUS: Full  TOTAL TIME TAKING CARE OF THIS PATIENT: 35 minutes.     POSSIBLE D/C IN 1-2 DAYS, DEPENDING ON CLINICAL CONDITION.   Vaughan Basta M.D on 07/08/2015   Between 7am to 6pm - Pager - [phone]  After 6pm go to [email or web address] - password [email or web address] [facility]  Office  [phone]  CC: Primary care physician; [name], MD  Note: This dictation was prepared with Dragon dictation along with smaller phrase technology. Any transcriptional errors that result from this process are unintentional.

## 2015-07-08 NOTE — Progress Notes (Signed)
Initial Nutrition Assessment   INTERVENTION:   Meals and Snacks: Cater to patient preferences Medical Food Supplement Therapy: will recommend on follow if intake poor   NUTRITION DIAGNOSIS:   Unintentional weight loss related to acute illness as evidenced by percent weight loss.  GOAL:   Patient will meet greater than or equal to 90% of their needs  MONITOR:   PO intake, Labs, Weight trends, I & O's  REASON FOR ASSESSMENT:   Malnutrition Screening Tool    ASSESSMENT:   Pt admitted with metabolic acidosis with acute renal failure and dehydration with healthcare acquired pna.  Past Medical History  Diagnosis Date  . Hypertension   . Glaucoma   . Cancer (HCC)     merkel cell cancer  . Cancer [facility])     prostate  . TIA (transient ischemic attack)   . Bipolar 1 disorder ([place])   . Presence of permanent cardiac pacemaker     Diet Order:  Diet 2 gram sodium Room service appropriate?: Yes; Fluid consistency:: Thin    Current Nutrition: Pt reports eating very well this am of oatmeal, muffin, fruit, and coffee. Pt reports this is a typical breakfast for him. Recorded po intake 90% of dinner last night as wel.   Food/Nutrition-Related History: Pt reports good appetite PTA eating 3 meals per day. Pt reports only eating fruits, vegetables, and meats, that he gets all of his 'carbs' from fruits and vegetables.   Scheduled Medications:  . amLODipine  5 mg Oral Daily  . atorvastatin  20 mg Oral q1800  . calcium gluconate  1 g Intravenous Once  . carbamazepine  600 mg Oral BID  . gabapentin  300 mg Oral BID  . heparin  5,000 Units Subcutaneous 3 times per day  . latanoprost  1 drop Both Eyes QHS  . metoprolol succinate  50 mg Oral Daily  . pantoprazole  40 mg Oral BID  . QUEtiapine  600 mg Oral QHS  . timolol  1 drop Right Eye Daily  . vancomycin  1,000 mg Intravenous Once    Continuous Medications:  . sodium chloride 50 mL/hr at 07/08/15 0745     Electrolyte/Renal  Profile and Glucose Profile:   Recent Labs Lab 07/07/15 0532 07/07/15 1038 07/07/15 1751 07/08/15 0514  NA 129* 133* 134* 129*  K 5.7* 4.4  --  4.5  CL 95* 101  --  97*  CO2 22 23  --  28  BUN 24* 23*  --  18  CREATININE 1.51* 1.31*  --  0.84  CALCIUM 8.9 8.2*  --  8.1*  GLUCOSE 155* 121*  --  98   Protein Profile:  Recent Labs Lab 07/07/15 0532  ALBUMIN 3.4*    Gastrointestinal Profile: Last BM:  07/07/2015   Nutrition-Focused Physical Exam Findings:  Unable to complete Nutrition-Focused physical exam at this time.    Weight Change: Pt reports his weight has been stable between 175-182lbs, currently weight of 182lbs. However per [facility] weight encounters pt weight loss of 9% in the past month and was previously weighing 190-200lbs.   Height:   Ht Readings from Last 1 Encounters:  07/07/15 6' (1.829 m)    Weight:   Wt Readings from Last 1 Encounters:  07/07/15 182 lb 15.7 oz (83 kg)   Wt Readings from Last 10 Encounters:  07/07/15 182 lb 15.7 oz (83 kg)  06/23/15 203 lb 9.6 oz (92.352 kg)  06/09/15 201 lb 14.4 oz (91.581 kg)  05/27/15 194 lb  0.1 oz (88 kg)  05/24/15 194 lb 11.2 oz (88.315 kg)  05/21/15 190 lb (86.183 kg)  11/29/14 190 lb (86.183 kg)  08/06/12 194 lb (87.998 kg)     BMI:  Body mass index is 24.81 kg/(m^2).  Estimated Nutritional Needs:   Kcal:  BEE: 1618kcals, TEE: (IF 1.1-1.3)(AF 1.2) 2135-2542kcals  Protein:  83-100g protein (1.0-1.2g/kg)  Fluid:  2075-2430mL of fluid (25-59mL/kg)  EDUCATION NEEDS:   No education needs identified at this time   [place], RD, LDN Pager [phone] Weekend/On-Call Pager [phone]

## 2015-07-09 LAB — CK
Total CK: 2514 U/L — ABNORMAL HIGH (ref 49–397)
Total CK: 2928 U/L — ABNORMAL HIGH (ref 49–397)

## 2015-07-09 LAB — BASIC METABOLIC PANEL
ANION GAP: 5 (ref 5–15)
BUN: 11 mg/dL (ref 6–20)
CHLORIDE: 98 mmol/L — AB (ref 101–111)
CO2: 26 mmol/L (ref 22–32)
Calcium: 8.3 mg/dL — ABNORMAL LOW (ref 8.9–10.3)
Creatinine, Ser: 0.63 mg/dL (ref 0.61–1.24)
GFR calc non Af Amer: >60 mL/min (ref >60)
GLUCOSE: 115 mg/dL — AB (ref 65–99)
POTASSIUM: 4.1 mmol/L (ref 3.5–5.1)
Sodium: 129 mmol/L — ABNORMAL LOW (ref 135–145)

## 2015-07-09 MED ORDER — BISACODYL 10 MG RE SUPP
10.0000 mg | Freq: Once | RECTAL | Status: AC
  Administered 2015-07-09: 11:00:00 10 mg via RECTAL
  Filled 2015-07-09: qty 1

## 2015-07-09 MED ORDER — LISINOPRIL 20 MG PO TABS
20.0000 mg | ORAL_TABLET | Freq: Every day | ORAL | Status: DC
  Administered 2015-07-09 – 2015-07-12 (×4): 20 mg via ORAL
  Filled 2015-07-09 (×4): qty 1

## 2015-07-09 NOTE — Progress Notes (Signed)
PT Cancellation Note  Patient Details Name: Douglas Perkins. MRN: [id number] DOB: 09/15/1945   Cancelled Treatment:    Reason Eval/Treat Not Completed: Other (comment)  Attempted to see pt X 3 this AM, pt on commode, then getting a suppository, then on the commode again.  Will try back this afternoon if appropriate.  Wayne Both, PT, DPT [phone]  Kreg Shropshire 07/09/2015, 12:12 PM

## 2015-07-09 NOTE — Progress Notes (Signed)
Pt had a syncope episode while sitting on the bedside cammode during shift. A code blue was called but pt revived after being placed in the bed. Pt has been alert since. Pt BP has been elevated and 10mg  hydralazine was administered. Pt BP decreased. No other signs of distress noted. Information was passed unto oncoming nurse.

## 2015-07-09 NOTE — Progress Notes (Signed)
Dr Anselm Jungling made aware of CK result, continue IV fluids at current rate

## 2015-07-09 NOTE — Evaluation (Signed)
Physical Therapy Evaluation Patient Details Name: Douglas Perkins. MRN: [id number] DOB: 10/03/1945 Today's Date: 07/09/2015   History of Present Illness  Pt here with sepsis, pt had pacemaker placed 1 week ago, has a history of syncopal episodes, came to the hospital this time with backling out-like episode. Has elevated CK levels near ~2.5K  Clinical Impression  Pt does well with PT exam however is weaker than his baseline and he shows need for assist with bed mobility and transfers.  He was safe and consistent with ambulation but had definite need of UEs with walker. Pt difficult to keep on task t/o session, but ultimately he shows good willingness and enthusiasm participating with PT exam.    Follow Up Recommendations Home health PT    Equipment Recommendations  Rolling walker with 5" wheels       Precautions / Restrictions Precautions Precautions: Fall Restrictions Weight Bearing Restrictions: No      Mobility  Bed Mobility Overal bed mobility: Needs Assistance Bed Mobility: Supine to Sit;Sit to Supine     Supine to sit: Min assist Sit to supine: Min assist   General bed mobility comments: Pt shows great effort, does need hand to hold onto getting in/out of bed  Transfers Overall transfer level: Needs assistance Equipment used: Rolling walker (2 wheeled) Transfers: Sit to/from Stand Sit to Stand: Min assist    General transfer comment: Pt needs extensive cuing, encouragement and some assist to keep weight forward while rising.  Ambulation/Gait Ambulation/Gait assistance: Min guard Ambulation Distance (Feet): 200 Feet Assistive device: Rolling walker (2 wheeled)       General Gait Details: Pt lacks good DF control and therefore has altered gait pattern.  He is reliant on the walker but ultimately does not have any LOBs or other signficant safety concerns.  Pt is slow but consistent cadence, minimal fatigue.      Balance Overall balance assessment:  (good  sitting balance, definite need of UEs in standing)       Pertinent Vitals/Pain Pain Assessment:  (reports LE numbness)    Home Living Family/patient expects to be discharged to:: Private residence Living Arrangements: Spouse/significant other Available Help at Discharge: Family   Home Equipment: Kasandra Knudsen - single point      Prior Function Level of Independence: Independent with assistive device(s)      Comments: Pt reports that he can get around the home as needed w/o issue at baseline.         Extremity/Trunk Assessment   Upper Extremity Assessment: Overall WFL for tasks assessed;Generalized weakness (L UE NT 2/2 recent pacer)      Lower Extremity Assessment: Generalized weakness;Overall WFL for tasks assessed (poor DF b/l, decreased quality of motion, atrophied)       Communication   Communication: No difficulties  Cognition Arousal/Alertness: Awake/alert Behavior During Therapy:  labile Overall Cognitive Status: Within Functional Limits for tasks assessed         Assessment/Plan    PT Assessment Patient needs continued PT services  PT Diagnosis Difficulty walking;Generalized weakness   PT Problem List Decreased strength;Decreased balance;Decreased activity tolerance;Decreased mobility;Decreased range of motion;Decreased knowledge of use of DME;Decreased safety awareness  PT Treatment Interventions Gait training;DME instruction;Functional mobility training;Therapeutic activities;Therapeutic exercise;Balance training;Neuromuscular re-education   PT Goals (Current goals can be found in the Care Plan section) Acute Rehab PT Goals Patient Stated Goal: "Go back home" PT Goal Formulation: With patient Time For Goal Achievement: 07/23/15 Potential to Achieve Goals: Fair    Frequency Min 2X/week  End of Session Equipment Utilized During Treatment: Gait belt Activity Tolerance: Patient tolerated treatment well Patient left: with bed alarm set;with call bell/phone  within reach;with family/visitor present       Time: [id number] PT Time Calculation (min) (ACUTE ONLY): 26 min   Charges:   PT Evaluation $PT Eval Moderate Complexity: 1 Procedure         Wayne Both, PT, DPT  07/09/2015, 5:16 PM

## 2015-07-09 NOTE — Progress Notes (Signed)
[place] [facility] [place] NAME: Douglas Perkins    MR#:  [id number]  DATE OF BIRTH:  1945/09/02  SUBJECTIVE:  CHIEF COMPLAINT:   Chief Complaint  Patient presents with  . Dehydration  . Diarrhea  . Altered Mental Status     Completely alert and oriented today.   No complains.   Early morning today- he had episode of unresponsiveness, he when he was on the commode and while placing him on the   bed- he regained his pulse and so CPR was never started.  REVIEW OF SYSTEMS:  CONSTITUTIONAL: No fever, fatigue or weakness.  EYES: No blurred or double vision.  EARS, NOSE, AND THROAT: No tinnitus or ear pain.  RESPIRATORY: No cough, shortness of breath, wheezing or hemoptysis.  CARDIOVASCULAR: No chest pain, orthopnea, edema.  GASTROINTESTINAL: No nausea, vomiting, diarrhea or abdominal pain.  GENITOURINARY: No dysuria, hematuria.  ENDOCRINE: No polyuria, nocturia,  HEMATOLOGY: No anemia, easy bruising or bleeding SKIN: No rash or lesion. MUSCULOSKELETAL: No joint pain or arthritis.   NEUROLOGIC: No tingling, numbness, weakness.  PSYCHIATRY: No anxiety or depression.   ROS  DRUG ALLERGIES:   Allergies  Allergen Reactions  . Other Hives, Shortness Of Breath and Other (See Comments)    Pt states that he is allergic to unwashed blood products.    . Feldene [Piroxicam] Hives  . Sulfa Antibiotics Hives    VITALS:  Blood pressure 154/67, pulse 81, temperature 98.8 F (37.1 C), temperature source Oral, resp. rate 20, height 6' (1.829 m), weight 83 kg (182 lb 15.7 oz), SpO2 100 %.  PHYSICAL EXAMINATION:  GENERAL:  70 y.o.-year-old patient lying in the bed with no acute distress.  EYES: Pupils equal, round, reactive to light and accommodation. No scleral icterus. Extraocular muscles intact.  HEENT: Head atraumatic, normocephalic. Oropharynx and nasopharynx clear.  NECK:  Supple, no jugular venous distention. No thyroid enlargement, no  tenderness.  LUNGS: Normal breath sounds bilaterally, no wheezing, rales,rhonchi or crepitation. No use of accessory muscles of respiration.  CARDIOVASCULAR: S1, S2 normal. No murmurs, rubs, or gallops. Left upper chest PPM present.  ABDOMEN: Soft, nontender, nondistended. Bowel sounds present. No organomegaly or mass. Foley in place. EXTREMITIES: No pedal edema, cyanosis, or clubbing.  NEUROLOGIC: Cranial nerves II through XII are intact. Muscle strength 5/5 in all extremities. Sensation intact. Gait not checked.  PSYCHIATRIC: The patient is alert and oriented x 3.  SKIN: No obvious rash, lesion, or ulcer.   Physical Exam LABORATORY PANEL:   CBC  Recent Labs Lab 07/08/15 0514  WBC 8.0  HGB 9.9*  HCT 30.1*  PLT 402   ------------------------------------------------------------------------------------------------------------------  Chemistries   Recent Labs Lab 07/07/15 0532  07/09/15 0552  NA 129*  < > 129*  K 5.7*  < > 4.1  CL 95*  < > 98*  CO2 22  < > 26  GLUCOSE 155*  < > 115*  BUN 24*  < > 11  CREATININE 1.51*  < > 0.63  CALCIUM 8.9  < > 8.3*  AST 69*  --   --   ALT 84*  --   --   ALKPHOS 118  --   --   BILITOT 0.2*  --   --   < > = values in this interval not displayed. ------------------------------------------------------------------------------------------------------------------  Cardiac Enzymes  Recent Labs Lab 07/07/15 0532  TROPONINI 0.06*   ------------------------------------------------------------------------------------------------------------------  RADIOLOGY:  No results found.  ASSESSMENT AND PLAN:  Active Problems:   Sepsis ([place])  * sepsis- suspected on admission, but seem like it was dehydration.   Much better now, Cx negative, Off Abx, no fever.  * HCAP suspected on admission- ruled out.  * Altered mental status   Due to metabolic encephalopathy   Much improved after IV hydration and improvement in the renal function.    Completely alert and oriented.  * Syncopal episode- on 07/09/15 morning.    He had 2 similar episodes in last 2-3 weeks and as a result he received a pacemaker from [place] last week. 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   Unfortunately patient was not on telemetry when this event happened so we don't have underlying rhythm recorded.   But maybe his pacemaker might have recorded. There was any abnormal rhythm.   Now he started on telemetry and his heart rate is stable since then.   Called cardiology consult to help [place] in this issue.     * Hyponatremia   It appears chronic, now improved with IV fluid.  * Metabolic acidosis   Likely due to dehydration, corrected after IV fluid.  * Acute renal failure   Corrected now with IV fluid.   Appreciated nephrology consult.  * Hyperkalemia   Corrected.  * Rhabdomyolysis   CK  is  higher today, coming down slowly.    We will hold statin for now.  All the records are reviewed and case discussed with Care Management/Social Workerr. Management plans discussed with the patient, family and they are in agreement.  CODE STATUS: Full  TOTAL TIME TAKING CARE OF THIS PATIENT: 35 minutes.    spoke to patient's wife in his room, and with Dr. Nehemiah Massed on the phone.   POSSIBLE D/C IN 1-2 DAYS, DEPENDING ON CLINICAL CONDITION.   Vaughan Basta M.D on 07/09/2015   Between 7am to 6pm - Pager - [phone]  After 6pm go to [email or web address] - password [email or web address] [facility]  Office  [phone]  CC: Primary care physician; [name], MD  Note: This dictation was prepared with Dragon dictation along with smaller phrase technology. Any transcriptional errors that result from this process are unintentional.

## 2015-07-09 NOTE — Plan of Care (Signed)
Problem: Spiritual Needs Goal: Ability to function at adequate level Outcome: Progressing Pt with no noted complaints, no distress or discomfort noted

## 2015-07-09 NOTE — Progress Notes (Signed)
[facility]  ROUNDING NOTE   Subjective:   Wife at bedside.  Na 129  CK 2514  NS at 155mL/hr   Objective:  Vital signs in last 24 hours:  Temp:  [97.5 F (36.4 C)-98 F (36.7 C)] 97.6 F (36.4 C) (03/10 0809) Pulse Rate:  [72-91] 75 (03/10 0809) Resp:  [18-20] 18 (03/10 0809) BP: (122-177)/(61-86) 157/67 mmHg (03/10 0809) SpO2:  [98 %-100 %] 98 % (03/10 0809)  Weight change:  Filed Weights   07/07/15 0524 07/07/15 1000  Weight: 85.2 kg (187 lb 13.3 oz) 83 kg (182 lb 15.7 oz)    Intake/Output: I/O last 3 completed shifts: In: [id number] [P.O.:720; I.V.:916] Out: 4150 [Urine:4150]   Intake/Output this shift:     Physical Exam: General: NAD  Head: Normocephalic, atraumatic. Moist oral mucosal membranes  Eyes: Anicteric, PERRL  Neck: Supple, trachea midline  Lungs:  Clear to auscultation  Heart: Regular rate and rhythm  Abdomen:  Soft, nontender,   Extremities: no peripheral edema.  Neurologic: Nonfocal, moving all four extremities  Skin: No lesions       Basic Metabolic Panel:  Recent Labs Lab 07/07/15 0532 07/07/15 1038 07/07/15 1751 07/08/15 0514 07/09/15 0552  NA 129* 133* 134* 129* 129*  K 5.7* 4.4  --  4.5 4.1  CL 95* 101  --  97* 98*  CO2 22 23  --  28 26  GLUCOSE 155* 121*  --  98 115*  BUN 24* 23*  --  18 11  CREATININE 1.51* 1.31*  --  0.84 0.63  CALCIUM 8.9 8.2*  --  8.1* 8.3*    Liver Function Tests:  Recent Labs Lab 07/07/15 0532  AST 69*  ALT 84*  ALKPHOS 118  BILITOT 0.2*  PROT 7.5  ALBUMIN 3.4*    Recent Labs Lab 07/07/15 0532  LIPASE 30   No results for input(s): AMMONIA in the last 168 hours.  CBC:  Recent Labs Lab 07/07/15 0532 07/08/15 0514  WBC 9.5 8.0  NEUTROABS 8.2*  --   HGB 9.3* 9.9*  HCT 28.6* 30.1*  MCV 81.5 82.3  PLT 416 402    Cardiac Enzymes:  Recent Labs Lab 07/07/15 0532 07/08/15 0514 07/09/15 0552  CKTOTAL 749* 4085* 2514*  TROPONINI 0.06*  --   --     BNP: Invalid  input(s): POCBNP  CBG:  Recent Labs Lab 07/07/15 0942 07/08/15 2331  GLUCAP 131* 128*    Microbiology: Results for orders placed or performed during the hospital encounter of 07/07/15  Culture, blood (routine x 2)     Status: None (Preliminary result)   Collection Time: 07/07/15  5:32 AM  Result Value Ref Range Status   Specimen Description BLOOD LEFT ANTECUBITAL  Final   Special Requests BOTTLES DRAWN AEROBIC AND ANAEROBIC 5ML  Final   Culture NO GROWTH 1 DAY  Final   Report Status PENDING  Incomplete  Culture, blood (routine x 2)     Status: None (Preliminary result)   Collection Time: 07/07/15  5:33 AM  Result Value Ref Range Status   Specimen Description BLOOD LEFT HAND  Final   Special Requests BOTTLES DRAWN AEROBIC AND ANAEROBIC 5ML  Final   Culture NO GROWTH 1 DAY  Final   Report Status PENDING  Incomplete  Urine culture     Status: None   Collection Time: 07/07/15  5:33 AM  Result Value Ref Range Status   Specimen Description URINE, RANDOM  Final   Special Requests Normal  Final   Culture NO GROWTH 1 DAY  Final   Report Status 07/08/2015 FINAL  Final    Coagulation Studies: No results for input(s): LABPROT, INR in the last 72 hours.  Urinalysis:  Recent Labs  07/07/15 0533  COLORURINE YELLOW*  LABSPEC 1.005  PHURINE 8.0  GLUCOSEU NEGATIVE  HGBUR 3+*  BILIRUBINUR NEGATIVE  KETONESUR NEGATIVE  PROTEINUR 30*  NITRITE NEGATIVE  LEUKOCYTESUR NEGATIVE      Imaging: No results found.   Medications:   . sodium chloride 150 mL/hr at 07/09/15 0559   . amLODipine  5 mg Oral Daily  . ARIPiprazole  5 mg Oral BID  . calcium gluconate  1 g Intravenous Once  . carbamazepine  600 mg Oral BID  . dabigatran  150 mg Oral Q12H  . gabapentin  300 mg Oral BID  . latanoprost  1 drop Both Eyes QHS  . lisinopril  20 mg Oral Daily  . metoprolol succinate  50 mg Oral Daily  . pantoprazole  40 mg Oral BID  . QUEtiapine  600 mg Oral QHS  . timolol  1 drop Right  Eye Daily  . vancomycin  1,000 mg Intravenous Once   acetaminophen, calcium carbonate, hydrALAZINE  Assessment/ Plan:  [name]. is a 70 y.o. white male with hypertension, glaucoma, merkel cell cancer, prostate cancer, TIA, bipolar disorder, AICD, who was admitted to [facility] on 07/07/2015  1. Acute renal failure 2. Hyperkalemia 3. Hyponatremia 4. Hypertension  Holding lisinopril. Agree with IV NS.  Will monitor serum sodium closely Will need hospital follow up    LOS: 2 Douglas Perkins 3/10/201711:19 AM
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
## 2015-07-09 NOTE — Care Management Important Message (Signed)
Important Message  Patient Details  Name: Douglas Perkins. MRN: [id number] Date of Birth: 12-13-1945   Medicare Important Message Given:  Yes    Douglas Perkins 07/09/2015, 9:53 AM

## 2015-07-09 NOTE — Care Management (Signed)
Admitted to [facility] with the diagnosis of sepsis. Lives with wife [phone] or [phone]). Code [place] called 07/09/15, 06/25/15, and 2/25. Transferred to [facility] 06/27/15.  Spoke with Mr. Baldi at the bedside. Stated that [facility] transferred him back to [facility], but notes indicate that he presented to the emergency room from home 07/07/15.  No skilled facility. No home health. Uses a cane to aid in ambulation. States he has a Teaching laboratory technician ordered. No falls. Good appetite. States he takes care of all his basic needs himself, still drives. Bipolar, Dr. Weber Cooks is seeing during this admission.  Physical therapy evaluation is pending. Shelbie Ammons RN MSN CCM Care Management [phone]

## 2015-07-10 DIAGNOSIS — R404 Transient alteration of awareness: Secondary | ICD-10-CM

## 2015-07-10 LAB — CK: Total CK: 1498 U/L — ABNORMAL HIGH (ref 49–397)

## 2015-07-10 LAB — CARBAMAZEPINE LEVEL, TOTAL: CARBAMAZEPINE LVL: 12.7 ug/mL — AB (ref 4.0–12.0)

## 2015-07-10 MED ORDER — GUAIFENESIN-DM 100-10 MG/5ML PO SYRP
10.0000 mL | ORAL_SOLUTION | Freq: Four times a day (QID) | ORAL | Status: DC | PRN
  Filled 2015-07-10: qty 10

## 2015-07-10 MED ORDER — TEMAZEPAM 7.5 MG PO CAPS
7.5000 mg | ORAL_CAPSULE | Freq: Every evening | ORAL | Status: DC | PRN
  Administered 2015-07-10 – 2015-07-11 (×2): 7.5 mg via ORAL
  Filled 2015-07-10 (×2): qty 1

## 2015-07-10 NOTE — Progress Notes (Signed)
Patient with complaints of cough.  Cough is non-productive, lungs sound clear bilateral, and 02 sats 97-98% on room air.  Order obtained from Dr. Anselm Jungling.

## 2015-07-10 NOTE — Progress Notes (Signed)
[facility] NAME: Douglas Perkins    MR#:  [id number]  DATE OF BIRTH:  Dec 29, 1945  SUBJECTIVE:  CHIEF COMPLAINT:   Chief Complaint  Patient presents with  . Dehydration  . Diarrhea  . Altered Mental Status     Completely alert and oriented today.   No complains.   Early morning 07/09/15- he had episode of unresponsiveness, he when he was on the commode and while placing him on the   bed- he regained his pulse and so CPR was never started. CK level is still high.  REVIEW OF SYSTEMS:  CONSTITUTIONAL: No fever, fatigue or weakness.  EYES: No blurred or double vision.  EARS, NOSE, AND THROAT: No tinnitus or ear pain.  RESPIRATORY: No cough, shortness of breath, wheezing or hemoptysis.  CARDIOVASCULAR: No chest pain, orthopnea, edema.  GASTROINTESTINAL: No nausea, vomiting, diarrhea or abdominal pain.  GENITOURINARY: No dysuria, hematuria.  ENDOCRINE: No polyuria, nocturia,  HEMATOLOGY: No anemia, easy bruising or bleeding SKIN: No rash or lesion. MUSCULOSKELETAL: No joint pain or arthritis.   NEUROLOGIC: No tingling, numbness, weakness.  PSYCHIATRY: No anxiety or depression.   ROS  DRUG ALLERGIES:   Allergies  Allergen Reactions  . Other Hives, Shortness Of Breath and Other (See Comments)    Pt states that he is allergic to unwashed blood products.    . Feldene [Piroxicam] Hives  . Sulfa Antibiotics Hives    VITALS:  Blood pressure 156/71, pulse 88, temperature 98.3 F (36.8 C), temperature source Oral, resp. rate 20, height 6' (1.829 m), weight 83 kg (182 lb 15.7 oz), SpO2 99 %.  PHYSICAL EXAMINATION:  GENERAL:  70 y.o.-year-old patient lying in the bed with no acute distress.  EYES: Pupils equal, round, reactive to light and accommodation. No scleral icterus. Extraocular muscles intact.  HEENT: Head atraumatic, normocephalic. Oropharynx and nasopharynx clear.  NECK:  Supple, no jugular venous distention. No  thyroid enlargement, no tenderness.  LUNGS: Normal breath sounds bilaterally, no wheezing, rales,rhonchi or crepitation. No use of accessory muscles of respiration.  CARDIOVASCULAR: S1, S2 normal. No murmurs, rubs, or gallops. Left upper chest PPM present.  ABDOMEN: Soft, nontender, nondistended. Bowel sounds present. No organomegaly or mass. Foley in place. EXTREMITIES: No pedal edema, cyanosis, or clubbing.  NEUROLOGIC: Cranial nerves II through XII are intact. Muscle strength 5/5 in all extremities. Sensation intact. Gait not checked.  PSYCHIATRIC: The patient is alert and oriented x 3.  SKIN: No obvious rash, lesion, or ulcer.   Physical Exam LABORATORY PANEL:   CBC  Recent Labs Lab 07/08/15 0514  WBC 8.0  HGB 9.9*  HCT 30.1*  PLT 402   ------------------------------------------------------------------------------------------------------------------  Chemistries   Recent Labs Lab 07/07/15 0532  07/09/15 0552  NA 129*  < > 129*  K 5.7*  < > 4.1  CL 95*  < > 98*  CO2 22  < > 26  GLUCOSE 155*  < > 115*  BUN 24*  < > 11  CREATININE 1.51*  < > 0.63  CALCIUM 8.9  < > 8.3*  AST 69*  --   --   ALT 84*  --   --   ALKPHOS 118  --   --   BILITOT 0.2*  --   --   < > = values in this interval not displayed. ------------------------------------------------------------------------------------------------------------------  Cardiac Enzymes  Recent Labs Lab 07/07/15 0532  TROPONINI 0.06*   ------------------------------------------------------------------------------------------------------------------  RADIOLOGY:  No results found.  ASSESSMENT AND PLAN:   Active Problems:   Sepsis ([place])  * sepsis- suspected on admission, but seem like it was dehydration.   Much better now, Cx negative, Off Abx, no fever.  * HCAP suspected on admission- ruled out.  * Altered mental status   Due to metabolic encephalopathy and may be hyponatremia.   Much improved after IV  hydration and improvement in the renal function.   Completely alert and oriented.  * Syncopal episode- on 07/09/15 morning.    He had 2 similar episodes in last 2-3 weeks and as a result he received a pacemaker from [place] last week. 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   Unfortunately patient was not on telemetry when this event happened so we don't have underlying rhythm recorded.   But maybe his pacemaker might have recorded. There was any abnormal rhythm.   Now he started on telemetry and his heart rate is stable since then.   Called cardiology consult to help [place] in this issue.[email or web address] interogation    Appreciated neurology consult.     * Hyponatremia   It appears chronic, now improved with IV fluid.   May be carbamazepine playing a role.  * Metabolic acidosis   Likely due to dehydration, corrected after IV fluid.  * Acute renal failure   Corrected now with IV fluid.   Appreciated nephrology consult.  * Hyperkalemia   Corrected.  * Rhabdomyolysis   CK  is  higher today, coming down slowly.    We will hold statin for now.    CK is still high, monitor with hydration. Renal func normal.  All the records are reviewed and case discussed with Care Management/Social Workerr. Management plans discussed with the patient, family and they are in agreement.  CODE STATUS: Full  TOTAL TIME TAKING CARE OF THIS PATIENT: 35 minutes.    spoke to patient's wife in his room, and with Dr. Nehemiah Massed on the phone.   POSSIBLE D/C IN 1-2 DAYS, DEPENDING ON CLINICAL CONDITION.   Vaughan Basta M.D on 07/10/2015   Between 7am to 6pm - Pager - [phone]  After 6pm go to [email or web address] - password [email or web address] [facility]  Office  [phone]  CC: Primary care physician; [name], MD  Note: This dictation was prepared with Dragon dictation along with smaller phrase technology. Any transcriptional errors that result  from this process are unintentional.

## 2015-07-10 NOTE — Consult Note (Signed)
Reason for Consult:Episodes of unresponsiveness Referring Physician: Anselm Jungling  CC: Episodes of unresponsiveness  HPI: Douglas Perkins. is an 70 y.o. male with a long history of dizzy spells and syncope.  He has been worked up by [employer] and Dr. Manuella Ghazi.  Recently the patient underwent an extensive cardiac work up.  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  Also due to arhythmia a pacemaker was placed.   Despite this the patient presented on 3/8 after a prolonged episode of unresponsiveness while on the commode.  Overnight was again noted to have an episode of unresponsivesness after being on the commode.  The patient reports that he was just concentrating on going to the bathroom and does not hear well.  From notes in the chart this does not appear to be the case.   Patient has had outpatient neurology work up per patient that I am unable to pull up on [place] today.  It does appear that he had a MRI of the brain that was unremarkable, as well as a routine and prolonged EEG.  Wife reports that there were some abnormalities on the prolonged record that may have suggested seizure.  Patient is on Tegretol and Neurontin.    Past Medical History  Diagnosis Date  . Hypertension   . Glaucoma   . Cancer (HCC)     merkel cell cancer  . Cancer [facility])     prostate  . TIA (transient ischemic attack)   . Bipolar 1 disorder ([place])   . Presence of permanent cardiac pacemaker     Past Surgical History  Procedure Laterality Date  . Prostate surgery    . Cholecystectomy    . Prostatectomy    . Leg surgery Left     distal  . Skin cancer excision  [id number]    Merkle Cell Carcinoma  . Pace maker placement      Family History  Problem Relation Age of Onset  . Stroke Father   . CAD Paternal Grandmother   . CAD Paternal Grandfather     Social History:  reports that he has quit smoking. He does not have any smokeless tobacco history on file. He reports that he does not drink  alcohol or use illicit drugs.  Allergies  Allergen Reactions  . Other Hives, Shortness Of Breath and Other (See Comments)    Pt states that he is allergic to unwashed blood products.    . Feldene [Piroxicam] Hives  . Sulfa Antibiotics Hives    Medications:  I have reviewed the patient's current medications. Prior to Admission:  Prescriptions prior to admission  Medication Sig Dispense Refill Last Dose  . amLODipine (NORVASC) 5 MG tablet Take 5 mg by mouth daily.    06/23/2015 at Unknown time  . ARIPiprazole (ABILIFY) 5 MG tablet Take 5 mg by mouth 2 (two) times daily.    06/22/2015 at Unknown time  . atorvastatin (LIPITOR) 20 MG tablet Take 1 tablet (20 mg total) by mouth daily at 6 PM.     . carbamazepine (TEGRETOL) 200 MG tablet Take 600 mg by mouth 2 (two) times daily.   06/23/2015 at Unknown time  . dabigatran (PRADAXA) 150 MG CAPS capsule Take 150 mg by mouth 2 (two) times daily.     Marland Kitchen esomeprazole (NEXIUM) 20 MG capsule Take 20 mg by mouth 2 (two) times daily before a meal.     . ferrous sulfate 325 (65 FE) MG tablet Take 650 mg by mouth  3 (three) times daily with meals.    06/23/2015 at Unknown time  . gabapentin (NEURONTIN) 300 MG capsule Take 300 mg by mouth 2 (two) times daily.   06/23/2015 at Unknown time  . glucosamine-chondroitin 500-400 MG tablet Take 1 tablet by mouth daily.   06/23/2015 at Unknown time  . latanoprost (XALATAN) 0.005 % ophthalmic solution Place 1 drop into both eyes at bedtime.   06/22/2015 at Unknown time  . lisinopril (PRINIVIL,ZESTRIL) 40 MG tablet Take 40 mg by mouth daily.    06/22/2015 at Unknown time  . metoprolol succinate (TOPROL-XL) 50 MG 24 hr tablet Take 50 mg by mouth daily. Take with or immediately following a meal.     . Multiple Vitamin (MULTIVITAMIN WITH MINERALS) TABS tablet Take 1 tablet by mouth daily.   06/23/2015 at Unknown time  . Omega-3 Fatty Acids (FISH OIL) 1000 MG CAPS Take 1,000 mg by mouth 2 (two) times daily.    06/23/2015 at Unknown  time  . QUEtiapine (SEROQUEL) 300 MG tablet Take 600 mg by mouth at bedtime.   06/22/2015 at Unknown time  . senna-docusate (SENOKOT-S) 8.6-50 MG tablet Take 2 tablets by mouth daily.     . temazepam (RESTORIL) 15 MG capsule Take 15 mg by mouth at bedtime as needed for sleep.   06/22/2015 at Unknown time  . timolol (BETIMOL) 0.5 % ophthalmic solution Place 1 drop into the right eye daily.   06/23/2015 at Unknown time   Scheduled: . amLODipine  5 mg Oral Daily  . ARIPiprazole  5 mg Oral BID  . calcium gluconate  1 g Intravenous Once  . carbamazepine  600 mg Oral BID  . dabigatran  150 mg Oral Q12H  . gabapentin  300 mg Oral BID  . latanoprost  1 drop Both Eyes QHS  . lisinopril  20 mg Oral Daily  . metoprolol succinate  50 mg Oral Daily  . pantoprazole  40 mg Oral BID  . QUEtiapine  600 mg Oral QHS  . timolol  1 drop Right Eye Daily  . vancomycin  1,000 mg Intravenous Once    ROS: History obtained from the patient  General ROS: negative for - chills, fatigue, fever, night sweats, weight gain or weight loss Psychological ROS: mania Ophthalmic ROS: negative for - blurry vision, double vision, eye pain or loss of vision ENT ROS: negative for - epistaxis, nasal discharge, oral lesions, sore throat, tinnitus or vertigo Allergy and Immunology ROS: negative for - hives or itchy/watery eyes Hematological and Lymphatic ROS: negative for - bleeding problems, bruising or swollen lymph nodes Endocrine ROS: negative for - galactorrhea, hair pattern changes, polydipsia/polyuria or temperature intolerance Respiratory ROS: negative for - cough, hemoptysis, shortness of breath or wheezing Cardiovascular ROS: negative for - chest pain, dyspnea on exertion, edema or irregular heartbeat Gastrointestinal ROS: negative for - abdominal pain, diarrhea, hematemesis, nausea/vomiting or stool incontinence Genito-Urinary ROS: negative for - dysuria, hematuria, incontinence or urinary  frequency/urgency Musculoskeletal ROS: negative for - joint swelling or muscular weakness Neurological ROS: as noted in HPI, numb right leg Dermatological ROS: negative for rash and skin lesion changes  Physical Examination: Blood pressure 139/64, pulse 76, temperature 97.7 F (36.5 C), temperature source Oral, resp. rate 16, height 6' (1.829 m), weight 83 kg (182 lb 15.7 oz), SpO2 97 %.  HEENT-  Normocephalic, no lesions, without obvious abnormality.  Normal external eye and conjunctiva.  Normal TM's bilaterally.  Normal auditory canals and external ears. Normal external nose, mucus membranes  and septum.  Normal pharynx. Cardiovascular- S1, S2 normal, pulses palpable throughout   Lungs- chest clear, no wheezing, rales, normal symmetric air entry Abdomen- soft, non-tender; bowel sounds normal; no masses,  no organomegaly Extremities- mild pedal edema Lymph-no adenopathy palpable Musculoskeletal-small right leg Skin-warm and dry, no hyperpigmentation, vitiligo, or suspicious lesions  Neurological Examination Mental Status: Alert, oriented, thought content appropriate.  Speech fluent without evidence of aphasia.  Able to follow 3 step commands without difficulty. Cranial Nerves: II: Discs flat bilaterally; Visual fields grossly normal, pupils equal, round, reactive to light and accommodation III,IV, VI: ptosis not present, extra-ocular motions intact bilaterally V,VII: smile symmetric, facial light touch sensation normal bilaterally VIII: hearing normal bilaterally IX,X: gag reflex present XI: bilateral shoulder shrug XII: midline tongue extension Motor: 5/5 in the BUE's with atrophy noted in the hand intrinsics.  5/5 in the LLE, 4+/5 in the RLE with atrophy noted of the RLE Sensory: Pinprick and light touch decreased below the knee in the RLE Deep Tendon Reflexes: 2+ in the upper extremities, 2+ left KJ, 1+ right KJ, absent AJ's bilaterally Plantars: Right: mute   Left:  mute Cerebellar: normal finger-to-nose testing bilaterally Gait: not tested due to safety concerns    Laboratory Studies:   Basic Metabolic Panel:  Recent Labs Lab 07/07/15 0532 07/07/15 1038 07/07/15 1751 07/08/15 0514 07/09/15 0552  NA 129* 133* 134* 129* 129*  K 5.7* 4.4  --  4.5 4.1  CL 95* 101  --  97* 98*  CO2 22 23  --  28 26  GLUCOSE 155* 121*  --  98 115*  BUN 24* 23*  --  18 11  CREATININE 1.51* 1.31*  --  0.84 0.63  CALCIUM 8.9 8.2*  --  8.1* 8.3*    Liver Function Tests:  Recent Labs Lab 07/07/15 0532  AST 69*  ALT 84*  ALKPHOS 118  BILITOT 0.2*  PROT 7.5  ALBUMIN 3.4*    Recent Labs Lab 07/07/15 0532  LIPASE 30   No results for input(s): AMMONIA in the last 168 hours.  CBC:  Recent Labs Lab 07/07/15 0532 07/08/15 0514  WBC 9.5 8.0  NEUTROABS 8.2*  --   HGB 9.3* 9.9*  HCT 28.6* 30.1*  MCV 81.5 82.3  PLT 416 402    Cardiac Enzymes:  Recent Labs Lab 07/07/15 0532 07/08/15 0514 07/09/15 0552 07/09/15 1353 07/10/15 0516  CKTOTAL 749* 4085* 2514* 2928* 1498*  TROPONINI 0.06*  --   --   --   --     BNP: Invalid input(s): POCBNP  CBG:  Recent Labs Lab 07/07/15 0942 07/08/15 2331  GLUCAP 131* 128*    Microbiology: Results for orders placed or performed during the hospital encounter of 07/07/15  Culture, blood (routine x 2)     Status: None (Preliminary result)   Collection Time: 07/07/15  5:32 AM  Result Value Ref Range Status   Specimen Description BLOOD LEFT ANTECUBITAL  Final   Special Requests BOTTLES DRAWN AEROBIC AND ANAEROBIC 5ML  Final   Culture NO GROWTH 3 DAYS  Final   Report Status PENDING  Incomplete  Culture, blood (routine x 2)     Status: None (Preliminary result)   Collection Time: 07/07/15  5:33 AM  Result Value Ref Range Status   Specimen Description BLOOD LEFT HAND  Final   Special Requests BOTTLES DRAWN AEROBIC AND ANAEROBIC 5ML  Final   Culture NO GROWTH 3 DAYS  Final   Report Status PENDING   Incomplete  Urine culture     Status: None   Collection Time: 07/07/15  5:33 AM  Result Value Ref Range Status   Specimen Description URINE, RANDOM  Final   Special Requests Normal  Final   Culture NO GROWTH 1 DAY  Final   Report Status 07/08/2015 FINAL  Final    Coagulation Studies: No results for input(s): LABPROT, INR in the last 72 hours.  Urinalysis:  Recent Labs Lab 07/07/15 0533  COLORURINE YELLOW*  LABSPEC 1.005  PHURINE 8.0  GLUCOSEU NEGATIVE  HGBUR 3+*  BILIRUBINUR NEGATIVE  KETONESUR NEGATIVE  PROTEINUR 30*  NITRITE NEGATIVE  LEUKOCYTESUR NEGATIVE    Lipid Panel:  No results found for: CHOL, TRIG, HDL, CHOLHDL, VLDL, LDLCALC  HgbA1C: No results found for: HGBA1C  Urine Drug Screen:  No results found for: LABOPIA, COCAINSCRNUR, LABBENZ, AMPHETMU, THCU, LABBARB  Alcohol Level: No results for input(s): ETH in the last 168 hours.  Other results: EKG: 79 bpm.  Imaging: No results found.   Assessment/Plan: 70 year old male presenting with recurrent episodes of altered awareness.  Patient has had along history of syncope and episodes of dizziness.  Has had extensive work up as an outpatient by both cardiology and neurology.  Both recent episodes both occurred in relationship to using the bathroom.  That and the fact that the patient already on anticonvulsant therapy for his bipolar disease make seizure less likely.  Has had recent long term monitoring.  Concerning that sodium is low as well which may be a consequence of the Tegretol.  Recommendations: 1.  Agree with interrogation of pacer 2.  Tegretol level 3.  If sodium becomes as issue may need to consider changing or altering Tegretol which will require psych input since patient already experiencing some mania.   4.  Orthostatic vitals  Alexis Goodell, MD Neurology [phone] 07/10/2015, 11:28 AM

## 2015-07-11 DIAGNOSIS — R55 Syncope and collapse: Secondary | ICD-10-CM

## 2015-07-11 LAB — BASIC METABOLIC PANEL
ANION GAP: 7 (ref 5–15)
BUN: 7 mg/dL (ref 6–20)
CALCIUM: 7.9 mg/dL — AB (ref 8.9–10.3)
CO2: 23 mmol/L (ref 22–32)
Chloride: 101 mmol/L (ref 101–111)
Creatinine, Ser: 0.53 mg/dL — ABNORMAL LOW (ref 0.61–1.24)
GFR calc Af Amer: >60 mL/min (ref >60)
GLUCOSE: 95 mg/dL (ref 65–99)
POTASSIUM: 3.3 mmol/L — AB (ref 3.5–5.1)
Sodium: 131 mmol/L — ABNORMAL LOW (ref 135–145)

## 2015-07-11 LAB — CBC
HEMATOCRIT: 25.7 % — AB (ref 40.0–52.0)
Hemoglobin: 8.5 g/dL — ABNORMAL LOW (ref 13.0–18.0)
MCH: 26.4 pg (ref 26.0–34.0)
MCHC: 33 g/dL (ref 32.0–36.0)
MCV: 79.8 fL — AB (ref 80.0–100.0)
PLATELETS: 307 10*3/uL (ref 150–440)
RBC: 3.23 MIL/uL — ABNORMAL LOW (ref 4.40–5.90)
RDW: 18.3 % — AB (ref 11.5–14.5)
WBC: 8.3 10*3/uL (ref 3.8–10.6)

## 2015-07-11 LAB — CK: Total CK: 719 U/L — ABNORMAL HIGH (ref 49–397)

## 2015-07-11 MED ORDER — FERROUS SULFATE 325 (65 FE) MG PO TABS
325.0000 mg | ORAL_TABLET | Freq: Two times a day (BID) | ORAL | Status: DC
  Administered 2015-07-11 – 2015-07-12 (×2): 325 mg via ORAL
  Filled 2015-07-11 (×2): qty 1

## 2015-07-11 MED ORDER — POTASSIUM CHLORIDE CRYS ER 20 MEQ PO TBCR
20.0000 meq | EXTENDED_RELEASE_TABLET | Freq: Two times a day (BID) | ORAL | Status: DC
  Administered 2015-07-11 – 2015-07-12 (×3): 20 meq via ORAL
  Filled 2015-07-11 (×4): qty 1

## 2015-07-11 NOTE — Progress Notes (Signed)
Subjective: Patient with an episode of asystole this morning.  Currently at baseline.    Objective: Current vital signs: BP 175/71 mmHg  Pulse 74  Temp(Src) 98.2 F (36.8 C) (Oral)  Resp 20  Ht 6' (1.829 m)  Wt 83 kg (182 lb 15.7 oz)  BMI 24.81 kg/m2  SpO2 100% Vital signs in last 24 hours: Temp:  [97.3 F (36.3 C)-98.3 F (36.8 C)] 98.2 F (36.8 C) (03/12 1229) Pulse Rate:  [65-88] 74 (03/12 1229) Resp:  [20] 20 (03/12 1229) BP: (118-175)/(55-74) 175/71 mmHg (03/12 1229) SpO2:  [97 %-100 %] 100 % (03/12 1229)  Intake/Output from previous day: 03/11 0701 - 03/12 0700 In: 1980 [P.O.:480; I.V.:1500] Out: 775 [Urine:775] Intake/Output this shift: Total I/O In: 240 [P.O.:240] Out: -  Nutritional status: Diet 2 gram sodium Room service appropriate?: Yes; Fluid consistency:: Thin  Neurologic Exam: Mental Status: Alert, oriented, thought content appropriate. Speech fluent without evidence of aphasia. Able to follow 3 step commands without difficulty. Cranial Nerves: II: Discs flat bilaterally; Visual fields grossly normal, pupils equal, round, reactive to light and accommodation III,IV, VI: ptosis not present, extra-ocular motions intact bilaterally V,VII: smile symmetric, facial light touch sensation normal bilaterally VIII: hearing normal bilaterally IX,X: gag reflex present XI: bilateral shoulder shrug XII: midline tongue extension Motor: 5/5 in the BUE's with atrophy noted in the hand intrinsics. 5/5 in the LLE, 4+/5 in the RLE with atrophy noted of the RLE Sensory: Pinprick and light touch decreased below the knee in the RLE Deep Tendon Reflexes: 2+ in the upper extremities, 2+ left KJ, 1+ right KJ, absent AJ's bilaterally  Lab Results: Basic Metabolic Panel:  Recent Labs Lab 07/07/15 0532 07/07/15 1038 07/07/15 1751 07/08/15 0514 07/09/15 0552 07/11/15 0453  NA 129* 133* 134* 129* 129* 131*  K 5.7* 4.4  --  4.5 4.1 3.3*  CL 95* 101  --  97* 98* 101   CO2 22 23  --  28 26 23   GLUCOSE 155* 121*  --  98 115* 95  BUN 24* 23*  --  18 11 7   CREATININE 1.51* 1.31*  --  0.84 0.63 0.53*  CALCIUM 8.9 8.2*  --  8.1* 8.3* 7.9*    Liver Function Tests:  Recent Labs Lab 07/07/15 0532  AST 69*  ALT 84*  ALKPHOS 118  BILITOT 0.2*  PROT 7.5  ALBUMIN 3.4*    Recent Labs Lab 07/07/15 0532  LIPASE 30   No results for input(s): AMMONIA in the last 168 hours.  CBC:  Recent Labs Lab 07/07/15 0532 07/08/15 0514 07/11/15 0453  WBC 9.5 8.0 8.3  NEUTROABS 8.2*  --   --   HGB 9.3* 9.9* 8.5*  HCT 28.6* 30.1* 25.7*  MCV 81.5 82.3 79.8*  PLT 416 402 307    Cardiac Enzymes:  Recent Labs Lab 07/07/15 0532 07/08/15 0514 07/09/15 0552 07/09/15 1353 07/10/15 0516 07/11/15 0453  CKTOTAL 749* 4085* 2514* 2928* 1498* 719*  TROPONINI 0.06*  --   --   --   --   --     Lipid Panel: No results for input(s): CHOL, TRIG, HDL, CHOLHDL, VLDL, LDLCALC in the last 168 hours.  CBG:  Recent Labs Lab 07/07/15 0942 07/08/15 2331  GLUCAP 131* 128*    Microbiology: Results for orders placed or performed during the hospital encounter of 07/07/15  Culture, blood (routine x 2)     Status: None (Preliminary result)   Collection Time: 07/07/15  5:32 AM  Result Value Ref  Range Status   Specimen Description BLOOD LEFT ANTECUBITAL  Final   Special Requests BOTTLES DRAWN AEROBIC AND ANAEROBIC 5ML  Final   Culture NO GROWTH 4 DAYS  Final   Report Status PENDING  Incomplete  Culture, blood (routine x 2)     Status: None (Preliminary result)   Collection Time: 07/07/15  5:33 AM  Result Value Ref Range Status   Specimen Description BLOOD LEFT HAND  Final   Special Requests BOTTLES DRAWN AEROBIC AND ANAEROBIC 5ML  Final   Culture NO GROWTH 4 DAYS  Final   Report Status PENDING  Incomplete  Urine culture     Status: None   Collection Time: 07/07/15  5:33 AM  Result Value Ref Range Status   Specimen Description URINE, RANDOM  Final   Special  Requests Normal  Final   Culture NO GROWTH 1 DAY  Final   Report Status 07/08/2015 FINAL  Final    Coagulation Studies: No results for input(s): LABPROT, INR in the last 72 hours.  Imaging: No results found.  Medications:  I have reviewed the patient's current medications. Scheduled: . amLODipine  5 mg Oral Daily  . ARIPiprazole  5 mg Oral BID  . calcium gluconate  1 g Intravenous Once  . carbamazepine  600 mg Oral BID  . dabigatran  150 mg Oral Q12H  . ferrous sulfate  325 mg Oral BID WC  . gabapentin  300 mg Oral BID  . latanoprost  1 drop Both Eyes QHS  . lisinopril  20 mg Oral Daily  . metoprolol succinate  50 mg Oral Daily  . pantoprazole  40 mg Oral BID  . potassium chloride  20 mEq Oral BID  . QUEtiapine  600 mg Oral QHS  . timolol  1 drop Right Eye Daily  . vancomycin  1,000 mg Intravenous Once    Assessment/Plan: Patient with asystole on monitor this morning  Awaiting interrogation of pacer.  Tegretol level 12.7.  Not trough.  Previous neurological work up has included MRI X 2 only significant for atrophy, carotid doppler X 2 showing no significant stenosis, echocardiogram, EEG with slowing and prolonged EEG with bifrontal and left mid-temporal epileptiform discharges vs artifact.  No event was captured.    Recommendations: 1.  Continue Tegretol at current dose 2.  Agree with interrogation 3.  Follow up with Dr. Manuella Ghazi on an outpatient basis.     LOS: 4 days   Alexis Goodell, MD Neurology [phone] 07/11/2015  12:30 PM

## 2015-07-11 NOTE — Progress Notes (Signed)
2:45pm - patient visiting with wife. Tele SR all day. VS - BP remains elevated - Norvasc po given at 1400 as patient did not want it this morning with all his other blood pressure medications. IV fluids discontinued per orders. No distress noted.

## 2015-07-11 NOTE — Progress Notes (Signed)
09:15 Patient had an episode of asystole. Patient immediately became alert on entry of nurses in room. Back to Sinus rhythm on tele. Able to speak and oriented. Patient seems unaffected - talkative and calling friend on phone.  Dr. Despina Hick and [facility] aware. 10:00 BP 165/63, HR 80. Am meds given.

## 2015-07-11 NOTE — Progress Notes (Signed)
[employer] CRM notified to review pace maker per MD request at [phone] spoke with Judeen Hammans

## 2015-07-11 NOTE — Progress Notes (Signed)
[facility]  ROUNDING NOTE   Subjective:   Syncopal episode with asystole this morning.  Now awake, alert and oriented.   CPK 719 Na 131  Objective:  Vital signs in last 24 hours:  Temp:  [97.3 F (36.3 C)-98.3 F (36.8 C)] 97.3 F (36.3 C) (03/12 0538) Pulse Rate:  [65-88] 65 (03/12 0538) Resp:  [20] 20 (03/12 0538) BP: (118-168)/(55-74) 118/55 mmHg (03/12 0538) SpO2:  [97 %-99 %] 97 % (03/12 0538)  Weight change:  Filed Weights   07/07/15 0524 07/07/15 1000  Weight: 85.2 kg (187 lb 13.3 oz) 83 kg (182 lb 15.7 oz)    Intake/Output: I/O last 3 completed shifts: In: 1980 [P.O.:480; I.V.:1500] Out: 2575 [Urine:2575]   Intake/Output this shift:  Total I/O In: 240 [P.O.:240] Out: -   Physical Exam: General: NAD  Head: Normocephalic, atraumatic. Moist oral mucosal membranes  Eyes: Anicteric, PERRL  Neck: Supple, trachea midline  Lungs:  Clear to auscultation  Heart: Regular rate and rhythm  Abdomen:  Soft, nontender,   Extremities: no peripheral edema.  Neurologic: Nonfocal, moving all four extremities  Skin: No lesions       Basic Metabolic Panel:  Recent Labs Lab 07/07/15 0532 07/07/15 1038 07/07/15 1751 07/08/15 0514 07/09/15 0552 07/11/15 0453  NA 129* 133* 134* 129* 129* 131*  K 5.7* 4.4  --  4.5 4.1 3.3*  CL 95* 101  --  97* 98* 101  CO2 22 23  --  28 26 23   GLUCOSE 155* 121*  --  98 115* 95  BUN 24* 23*  --  18 11 7   CREATININE 1.51* 1.31*  --  0.84 0.63 0.53*  CALCIUM 8.9 8.2*  --  8.1* 8.3* 7.9*    Liver Function Tests:  Recent Labs Lab 07/07/15 0532  AST 69*  ALT 84*  ALKPHOS 118  BILITOT 0.2*  PROT 7.5  ALBUMIN 3.4*    Recent Labs Lab 07/07/15 0532  LIPASE 30   No results for input(s): AMMONIA in the last 168 hours.  CBC:  Recent Labs Lab 07/07/15 0532 07/08/15 0514 07/11/15 0453  WBC 9.5 8.0 8.3  NEUTROABS 8.2*  --   --   HGB 9.3* 9.9* 8.5*  HCT 28.6* 30.1* 25.7*  MCV 81.5 82.3 79.8*  PLT 416  402 307    Cardiac Enzymes:  Recent Labs Lab 07/07/15 0532 07/08/15 0514 07/09/15 0552 07/09/15 1353 07/10/15 0516 07/11/15 0453  CKTOTAL 749* 4085* 2514* 2928* 1498* 719*  TROPONINI 0.06*  --   --   --   --   --     BNP: Invalid input(s): POCBNP  CBG:  Recent Labs Lab 07/07/15 0942 07/08/15 2331  GLUCAP 131* 128*    Microbiology: Results for orders placed or performed during the hospital encounter of 07/07/15  Culture, blood (routine x 2)     Status: None (Preliminary result)   Collection Time: 07/07/15  5:32 AM  Result Value Ref Range Status   Specimen Description BLOOD LEFT ANTECUBITAL  Final   Special Requests BOTTLES DRAWN AEROBIC AND ANAEROBIC 5ML  Final   Culture NO GROWTH 3 DAYS  Final   Report Status PENDING  Incomplete  Culture, blood (routine x 2)     Status: None (Preliminary result)   Collection Time: 07/07/15  5:33 AM  Result Value Ref Range Status   Specimen Description BLOOD LEFT HAND  Final   Special Requests BOTTLES DRAWN AEROBIC AND ANAEROBIC 5ML  Final   Culture NO GROWTH  3 DAYS  Final   Report Status PENDING  Incomplete  Urine culture     Status: None   Collection Time: 07/07/15  5:33 AM  Result Value Ref Range Status   Specimen Description URINE, RANDOM  Final   Special Requests Normal  Final   Culture NO GROWTH 1 DAY  Final   Report Status 07/08/2015 FINAL  Final    Coagulation Studies: No results for input(s): LABPROT, INR in the last 72 hours.  Urinalysis: No results for input(s): COLORURINE, LABSPEC, PHURINE, GLUCOSEU, HGBUR, BILIRUBINUR, KETONESUR, PROTEINUR, UROBILINOGEN, NITRITE, LEUKOCYTESUR in the last 72 hours.  Invalid input(s): APPERANCEUR    Imaging: No results found.   Medications:   . sodium chloride 150 mL/hr at 07/11/15 0356   . amLODipine  5 mg Oral Daily  . ARIPiprazole  5 mg Oral BID  . calcium gluconate  1 g Intravenous Once  . carbamazepine  600 mg Oral BID  . dabigatran  150 mg Oral Q12H  .  gabapentin  300 mg Oral BID  . latanoprost  1 drop Both Eyes QHS  . lisinopril  20 mg Oral Daily  . metoprolol succinate  50 mg Oral Daily  . pantoprazole  40 mg Oral BID  . potassium chloride  20 mEq Oral BID  . QUEtiapine  600 mg Oral QHS  . timolol  1 drop Right Eye Daily  . vancomycin  1,000 mg Intravenous Once   acetaminophen, calcium carbonate, guaiFENesin-dextromethorphan, hydrALAZINE, temazepam  Assessment/ Plan:  [name]. is a 70 y.o. white male with hypertension, glaucoma, merkel cell cancer, prostate cancer, TIA, bipolar disorder, AICD, who was admitted to [facility] on 07/07/2015  1. Acute renal failure - creatinine back to baseline 2. Hyperkalemia - now hypokalemia from NS infusion.  3. Hyponatremia 4. Hypertension 5. Rhabdomyolysis  Holding lisinopril.  Agree with IV NS.  Agree with Potassium chloride, monitor potassium levels.  Will monitor serum sodium closely. Most likely does not need fluid restriction Appreciate cardiology input.    LOS: Raymer, Ruffin Lada 3/12/20179:58 AM

## 2015-07-11 NOTE — Progress Notes (Addendum)
[place] at [place] NAME: Douglas Perkins    MR#:  [id number]  DATE OF BIRTH:  28-Apr-1946  SUBJECTIVE:  CHIEF COMPLAINT:   Chief Complaint  Patient presents with  . Dehydration  . Diarrhea  . Altered Mental Status     Completely alert and oriented today.   No complains.   Early morning 07/09/15- he had episode of unresponsiveness, he when he was on the commode and while placing him on the   bed- he regained his pulse and so CPR was never started. CK level came down now to 700, he again had episode of asystole today.   His pacemaker is not interrogated yet- since last episode.   Pt is completely alert and oriented now, NSR.  REVIEW OF SYSTEMS:  CONSTITUTIONAL: No fever, fatigue or weakness.  EYES: No blurred or double vision.  EARS, NOSE, AND THROAT: No tinnitus or ear pain.  RESPIRATORY: No cough, shortness of breath, wheezing or hemoptysis.  CARDIOVASCULAR: No chest pain, orthopnea, edema.  GASTROINTESTINAL: No nausea, vomiting, diarrhea or abdominal pain.  GENITOURINARY: No dysuria, hematuria.  ENDOCRINE: No polyuria, nocturia,  HEMATOLOGY: No anemia, easy bruising or bleeding SKIN: No rash or lesion. MUSCULOSKELETAL: No joint pain or arthritis.   NEUROLOGIC: No tingling, numbness, weakness.  PSYCHIATRY: No anxiety or depression.   ROS  DRUG ALLERGIES:   Allergies  Allergen Reactions  . Other Hives, Shortness Of Breath and Other (See Comments)    Pt states that he is allergic to unwashed blood products.    . Feldene [Piroxicam] Hives  . Sulfa Antibiotics Hives    VITALS:  Blood pressure 118/55, pulse 65, temperature 97.3 F (36.3 C), temperature source Oral, resp. rate 20, height 6' (1.829 m), weight 83 kg (182 lb 15.7 oz), SpO2 97 %.  PHYSICAL EXAMINATION:  GENERAL:  70 y.o.-year-old patient lying in the bed with no acute distress.  EYES: Pupils equal, round, reactive to light and accommodation. No scleral  icterus. Extraocular muscles intact.  HEENT: Head atraumatic, normocephalic. Oropharynx and nasopharynx clear.  NECK:  Supple, no jugular venous distention. No thyroid enlargement, no tenderness.  LUNGS: Normal breath sounds bilaterally, no wheezing, rales,rhonchi or crepitation. No use of accessory muscles of respiration.  CARDIOVASCULAR: S1, S2 normal. No murmurs, rubs, or gallops. Left upper chest PPM present.  ABDOMEN: Soft, nontender, nondistended. Bowel sounds present. No organomegaly or mass. Foley in place. EXTREMITIES: No pedal edema, cyanosis, or clubbing.  NEUROLOGIC: Cranial nerves II through XII are intact. Muscle strength 5/5 in all extremities. Sensation intact. Gait not checked.  PSYCHIATRIC: The patient is alert and oriented x 3.  SKIN: No obvious rash, lesion, or ulcer.   Physical Exam LABORATORY PANEL:   CBC  Recent Labs Lab 07/11/15 0453  WBC 8.3  HGB 8.5*  HCT 25.7*  PLT 307   ------------------------------------------------------------------------------------------------------------------  Chemistries   Recent Labs Lab 07/07/15 0532  07/11/15 0453  NA 129*  < > 131*  K 5.7*  < > 3.3*  CL 95*  < > 101  CO2 22  < > 23  GLUCOSE 155*  < > 95  BUN 24*  < > 7  CREATININE 1.51*  < > 0.53*  CALCIUM 8.9  < > 7.9*  AST 69*  --   --   ALT 84*  --   --   ALKPHOS 118  --   --   BILITOT 0.2*  --   --   < > =  values in this interval not displayed. ------------------------------------------------------------------------------------------------------------------  Cardiac Enzymes  Recent Labs Lab 07/07/15 0532  TROPONINI 0.06*   ------------------------------------------------------------------------------------------------------------------  RADIOLOGY:  No results found.  ASSESSMENT AND PLAN:   Active Problems:   Sepsis ([place])  * sepsis- suspected on admission, but seem like it was dehydration.   Much better now, Cx negative, Off Abx, no  fever.  * HCAP suspected on admission- ruled out.  * Altered mental status   Due to metabolic encephalopathy and may be hyponatremia.   Much improved after IV hydration and improvement in the renal function.   Completely alert and oriented.  * Syncopal episode- on 07/09/15 morning. And 07/11/15 morning with asystole noted on tele.    He had 2 similar episodes in last 2-3 weeks and as a result he received a pacemaker from [place] last week. 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   Unfortunately patient was not on telemetry when first event happened so we don't have underlying rhythm recorded.   But maybe his pacemaker might have recorded.    With second episode he had asystole on tele, and PPM did not spike.   I called Dr. Nehemiah Massed again today.   Appreciated neurology help- checked tegretol level, slightly high, but I don't think that is the reason of his symptoms.     * Hyponatremia   It appears chronic, now improved with IV fluid.   May be carbamazepine playing a role.   Will adjust the dose as per neuro recommendation later.  * Metabolic acidosis   Likely due to dehydration, corrected after IV fluid.  * Acute renal failure   Corrected now with IV fluid.   Appreciated nephrology consult.  * Hyperkalemia   Corrected.  * Rhabdomyolysis   CK  is  higher today, coming down slowly.    We will hold statin for now.    CK is coming down, Renal func normal.  All the records are reviewed and case discussed with Care Management/Social Workerr. Management plans discussed with the patient, family and they are in agreement.  CODE STATUS: Full  TOTAL TIME TAKING CARE OF THIS PATIENT: 35 critical care minutes.  Condition is critical due to recurrent syncope and asystole. Continue to monitor on tele.   spoke to patient's wife in his room, and with Dr. Nehemiah Massed on the phone.   POSSIBLE D/C IN 1-2 DAYS, DEPENDING ON CLINICAL CONDITION.   Vaughan Basta M.D on 07/11/2015   Between 7am to 6pm - Pager - [phone]  After 6pm go to [email or web address] - password [email or web address] [facility]  Office  [phone]  CC: Primary care physician; [name], MD  Note: This dictation was prepared with Dragon dictation along with smaller phrase technology. Any transcriptional errors that result from this process are unintentional.

## 2015-07-11 NOTE — Progress Notes (Signed)
1200: Telemetry monitoring center called to check on tele strip for asystole. Douglas Perkins told me that unit was notified in error re- patient in asystole. Pacer technician on floor to check patients pacer - no record of asytole or malfunction found. Patient resting in bed.

## 2015-07-11 NOTE — Progress Notes (Signed)
1500: Patients BP elevated - 170's/70s - Hydralazine 10mg  IV given.

## 2015-07-12 LAB — BASIC METABOLIC PANEL
Anion gap: 5 (ref 5–15)
BUN: 7 mg/dL (ref 6–20)
CALCIUM: 8.3 mg/dL — AB (ref 8.9–10.3)
CO2: 26 mmol/L (ref 22–32)
CREATININE: 0.63 mg/dL (ref 0.61–1.24)
Chloride: 100 mmol/L — ABNORMAL LOW (ref 101–111)
GFR calc non Af Amer: >60 mL/min (ref >60)
GLUCOSE: 97 mg/dL (ref 65–99)
Potassium: 3.7 mmol/L (ref 3.5–5.1)
Sodium: 131 mmol/L — ABNORMAL LOW (ref 135–145)

## 2015-07-12 LAB — CULTURE, BLOOD (ROUTINE X 2)
Culture: NO GROWTH
Culture: NO GROWTH

## 2015-07-12 MED ORDER — ACETAMINOPHEN 325 MG PO TABS: 650.0000 mg | ORAL_TABLET | Freq: Four times a day (QID) | ORAL | Status: DC | PRN

## 2015-07-12 MED ORDER — LISINOPRIL 20 MG PO TABS: 20.0000 mg | ORAL_TABLET | Freq: Every day | ORAL | Status: DC

## 2015-07-12 MED ORDER — CALCIUM CARBONATE ANTACID 500 MG PO CHEW: 1.0000 | CHEWABLE_TABLET | Freq: Three times a day (TID) | ORAL | Status: DC | PRN

## 2015-07-12 MED ORDER — GUAIFENESIN-DM 100-10 MG/5ML PO SYRP: 10.0000 mL | ORAL_SOLUTION | Freq: Four times a day (QID) | ORAL | Status: DC | PRN

## 2015-07-12 MED ORDER — HYDRALAZINE HCL 25 MG PO TABS
25.0000 mg | ORAL_TABLET | Freq: Three times a day (TID) | ORAL | Status: DC
  Administered 2015-07-12: 25 mg via ORAL
  Filled 2015-07-12: qty 1

## 2015-07-12 MED ORDER — POTASSIUM CHLORIDE CRYS ER 20 MEQ PO TBCR: 20.0000 meq | EXTENDED_RELEASE_TABLET | Freq: Every day | ORAL | Status: DC

## 2015-07-12 MED ORDER — HYDRALAZINE HCL 25 MG PO TABS: 25.0000 mg | ORAL_TABLET | Freq: Three times a day (TID) | ORAL | Status: DC

## 2015-07-12 NOTE — Discharge Summary (Signed)
[place] at [place] NAME: Douglas Perkins    MR#:  [id number]  DATE OF BIRTH:  05-16-1945  DATE OF ADMISSION:  07/07/2015 ADMITTING PHYSICIAN: Vaughan Basta, MD  DATE OF DISCHARGE: 07/12/2015  PRIMARY CARE PHYSICIAN: [name], MD    ADMISSION DIAGNOSIS:  Hyperkalemia [E87.5] Somnolence [R40.0] Hyponatremia [E87.1] Encephalopathy [G93.40] Bipolar I disorder ([place]) [F31.9] Hypothermia, initial encounter [T68.XXXA] Non-traumatic rhabdomyolysis [M62.82]  DISCHARGE DIAGNOSIS:  Syncope Rhabdomyolysis  SECONDARY DIAGNOSIS:   Past Medical History  Diagnosis Date  . Hypertension   . Glaucoma   . Cancer (HCC)     merkel cell cancer  . Cancer [facility])     prostate  . TIA (transient ischemic attack)   . Bipolar 1 disorder ([place])   . Presence of permanent cardiac pacemaker     HOSPITAL COURSE:   sepsis- suspected on admission, but seem like it was dehydration.  Much better now, Cx negative, Off Abx, no fever.  * HCAP suspected on admission- ruled out.  * Altered mental status-resolved  Due to metabolic encephalopathy and dehydration  Much improved after IV hydration and improvement in the renal function.    * Syncopal episode-probably from dehydration and vasovagal  on  07/11/15 morning with asystole noted on tele-but according to my discussion with Dr. Nehemiah Massed It was not too asystole , new telemetry central office has mixed up with his name by mistake  He had 2 similar episodes in last 2-3 weeks and as a result he received a pacemaker from [place] last week. 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 Continue Pradaxa for apical thrombus  Pacemaker was interrogated which was normal Okay to discharge patient from cardiology standpoint Appreciated neurology help- checked tegretol level, slightly high, but I don't think that is the reason of his symptoms.   *  Hyponatremia  It appears chronic, now improved with IV fluid.   * Metabolic acidosis  Likely due to dehydration, corrected after IV fluid.  * Acute renal failure  Corrected now with IV fluid.  Appreciated nephrology consult.  * Hyperkalemia  Corrected.  * Rhabdomyolysis  Resolved with IV fluids Statin on hold can be resumed by primary care physician in a week and monitor her clinically closely.  DISCHARGE CONDITIONS:   Fair  CONSULTS OBTAINED:  Treatment Team:  Lavonia Dana, MD Alexis Goodell, MD Nicholes Mango, MD   PROCEDURES pacemaker interrogation was normal  DRUG ALLERGIES:   Allergies  Allergen Reactions  . Other Hives, Shortness Of Breath and Other (See Comments)    Pt states that he is allergic to unwashed blood products.    . Feldene [Piroxicam] Hives  . Sulfa Antibiotics Hives    DISCHARGE MEDICATIONS:   Current Discharge Medication List    START taking these medications   Details  acetaminophen (TYLENOL) 325 MG tablet Take 2 tablets (650 mg total) by mouth every 6 (six) hours as needed for mild pain or moderate pain.    calcium carbonate (TUMS - DOSED IN MG ELEMENTAL CALCIUM) 500 MG chewable tablet Chew 1 tablet (200 mg of elemental calcium total) by mouth 3 (three) times daily as needed for indigestion or heartburn.    guaiFENesin-dextromethorphan (ROBITUSSIN DM) 100-10 MG/5ML syrup Take 10 mLs by mouth every 6 (six) hours as needed for cough. Qty: 118 mL, Refills: 0    hydrALAZINE (APRESOLINE) 25 MG tablet Take 1 tablet (25 mg total) by mouth every 8 (  eight) hours. Qty: 90 tablet, Refills: 0    !! lisinopril (PRINIVIL,ZESTRIL) 20 MG tablet Take 1 tablet (20 mg total) by mouth daily. Qty: 30 tablet, Refills: 0    potassium chloride SA (K-DUR,KLOR-CON) 20 MEQ tablet Take 1 tablet (20 mEq total) by mouth daily. Qty: 15 tablet, Refills: 0     !! - Potential duplicate medications found. Please discuss with provider.    CONTINUE these  medications which have NOT CHANGED   Details  amLODipine (NORVASC) 5 MG tablet Take 5 mg by mouth daily.     ARIPiprazole (ABILIFY) 5 MG tablet Take 5 mg by mouth 2 (two) times daily.     atorvastatin (LIPITOR) 20 MG tablet Take 1 tablet (20 mg total) by mouth daily at 6 PM.    carbamazepine (TEGRETOL) 200 MG tablet Take 600 mg by mouth 2 (two) times daily.    dabigatran (PRADAXA) 150 MG CAPS capsule Take 150 mg by mouth 2 (two) times daily.    esomeprazole (NEXIUM) 20 MG capsule Take 20 mg by mouth 2 (two) times daily before a meal.    ferrous sulfate 325 (65 FE) MG tablet Take 650 mg by mouth 3 (three) times daily with meals.     gabapentin (NEURONTIN) 300 MG capsule Take 300 mg by mouth 2 (two) times daily.    glucosamine-chondroitin 500-400 MG tablet Take 1 tablet by mouth daily.    latanoprost (XALATAN) 0.005 % ophthalmic solution Place 1 drop into both eyes at bedtime.    !! lisinopril (PRINIVIL,ZESTRIL) 40 MG tablet Take 40 mg by mouth daily.     metoprolol succinate (TOPROL-XL) 50 MG 24 hr tablet Take 50 mg by mouth daily. Take with or immediately following a meal.    Multiple Vitamin (MULTIVITAMIN WITH MINERALS) TABS tablet Take 1 tablet by mouth daily.    Omega-3 Fatty Acids (FISH OIL) 1000 MG CAPS Take 1,000 mg by mouth 2 (two) times daily.     QUEtiapine (SEROQUEL) 300 MG tablet Take 600 mg by mouth at bedtime.    senna-docusate (SENOKOT-S) 8.6-50 MG tablet Take 2 tablets by mouth daily.    temazepam (RESTORIL) 15 MG capsule Take 15 mg by mouth at bedtime as needed for sleep.    timolol (BETIMOL) 0.5 % ophthalmic solution Place 1 drop into the right eye daily.     !! - Potential duplicate medications found. Please discuss with provider.    STOP taking these medications     pantoprazole (PROTONIX) 40 MG tablet          DISCHARGE INSTRUCTIONS:   Activity as tolerated per PT recommendations, home health PT and RN Diet-cardiac Follow-up with primary care  physician in a week and PCP to consider repeating BMP in 1 week Follow-up with nephrology and cardiology as recommended   DIET:  Cardiac diet  DISCHARGE CONDITION:  Fair  ACTIVITY:  Activity as tolerated per PT recommendation  OXYGEN:  Home Oxygen: No.   Oxygen Delivery: room air  DISCHARGE LOCATION:  home   If you experience worsening of your admission symptoms, develop shortness of breath, life threatening emergency, suicidal or homicidal thoughts you must seek medical attention immediately by calling 911 or calling your MD immediately  if symptoms less severe.  You Must read complete instructions/literature along with all the possible adverse reactions/side effects for all the Medicines you take and that have been prescribed to you. Take any new Medicines after you have completely understood and accpet all the possible adverse reactions/side effects.  Please note  You were cared for by a hospitalist during your hospital stay. If you have any questions about your discharge medications or the care you received while you were in the hospital after you are discharged, you can call the unit and asked to speak with the hospitalist on call if the hospitalist that took care of you is not available. Once you are discharged, your primary care physician will handle any further medical issues. Please note that NO REFILLS for any discharge medications will be authorized once you are discharged, as it is imperative that you return to your primary care physician (or establish a relationship with a primary care physician if you do not have one) for your aftercare needs so that they can reassess your need for medications and monitor your lab values.     Today  Chief Complaint  Patient presents with  . Dehydration  . Diarrhea  . Altered Mental Status   Patient is resting comfortably. Denies any chest pain or shortness of breath. Denies any dizziness either pacemaker was interrogated  yesterday which was normal. As per my discussion with Dr. Nehemiah Massed okay to discharge patient from his standpoint as asystole episode was an error by the new telemetry office.  ROS:  CONSTITUTIONAL: Denies fevers, chills. Denies any fatigue, weakness.  EYES: Denies blurry vision, double vision, eye pain. EARS, NOSE, THROAT: Denies tinnitus, ear pain, hearing loss. RESPIRATORY: Denies cough, wheeze, shortness of breath.  CARDIOVASCULAR: Denies chest pain, palpitations, edema.  GASTROINTESTINAL: Denies nausea, vomiting, diarrhea, abdominal pain. Denies bright red blood per rectum. GENITOURINARY: Denies dysuria, hematuria. ENDOCRINE: Denies nocturia or thyroid problems. HEMATOLOGIC AND LYMPHATIC: Denies easy bruising or bleeding. SKIN: Denies rash or lesion. MUSCULOSKELETAL: Denies pain in neck, back, shoulder, knees, hips or arthritic symptoms.  NEUROLOGIC: Denies paralysis, paresthesias.  PSYCHIATRIC: Denies anxiety or depressive symptoms.   VITAL SIGNS:  Blood pressure 139/61, pulse 68, temperature 97.5 F (36.4 C), temperature source Oral, resp. rate 20, height 6' (1.829 m), weight 83 kg (182 lb 15.7 oz), SpO2 99 %.  I/O:    Intake/Output Summary (Last 24 hours) at 07/12/15 1247 Last data filed at 07/12/15 0924  Gross per 24 hour  Intake    720 ml  Output   2300 ml  Net  -1580 ml    PHYSICAL EXAMINATION:  GENERAL:  70 y.o.-year-old patient lying in the bed with no acute distress.  EYES: Pupils equal, round, reactive to light and accommodation. No scleral icterus. Extraocular muscles intact.  HEENT: Head atraumatic, normocephalic. Oropharynx and nasopharynx clear.  NECK:  Supple, no jugular venous distention. No thyroid enlargement, no tenderness.  LUNGS: Normal breath sounds bilaterally, no wheezing, rales,rhonchi or crepitation. No use of accessory muscles of respiration.  CARDIOVASCULAR: S1, S2 normal. No murmurs, rubs, or gallops. Anterior chest wall with pacemaker site with  Steri-Strips, no erythema  ABDOMEN: Soft, non-tender, non-distended. Bowel sounds present. No organomegaly or mass.  EXTREMITIES: No pedal edema, cyanosis, or clubbing.  NEUROLOGIC: Cranial nerves II through XII are intact. Muscle strength 5/5 in all extremities. Sensation intact. Gait not checked.  PSYCHIATRIC: The patient is alert and oriented x 3.  SKIN: No obvious rash, lesion, or ulcer.   DATA REVIEW:   CBC  Recent Labs Lab 07/11/15 0453  WBC 8.3  HGB 8.5*  HCT 25.7*  PLT 307    Chemistries   Recent Labs Lab 07/07/15 0532  07/12/15 0400  NA 129*  < > 131*  K 5.7*  < > 3.7  CL 95*  < > 100*  CO2 22  < > 26  GLUCOSE 155*  < > 97  BUN 24*  < > 7  CREATININE 1.51*  < > 0.63  CALCIUM 8.9  < > 8.3*  AST 69*  --   --   ALT 84*  --   --   ALKPHOS 118  --   --   BILITOT 0.2*  --   --   < > = values in this interval not displayed.  Cardiac Enzymes  Recent Labs Lab 07/07/15 0532  TROPONINI 0.06*    Microbiology Results  Results for orders placed or performed during the hospital encounter of 07/07/15  Culture, blood (routine x 2)     Status: None   Collection Time: 07/07/15  5:32 AM  Result Value Ref Range Status   Specimen Description BLOOD LEFT ANTECUBITAL  Final   Special Requests BOTTLES DRAWN AEROBIC AND ANAEROBIC 5ML  Final   Culture NO GROWTH 5 DAYS  Final   Report Status 07/12/2015 FINAL  Final  Culture, blood (routine x 2)     Status: None   Collection Time: 07/07/15  5:33 AM  Result Value Ref Range Status   Specimen Description BLOOD LEFT HAND  Final   Special Requests BOTTLES DRAWN AEROBIC AND ANAEROBIC 5ML  Final   Culture NO GROWTH 5 DAYS  Final   Report Status 07/12/2015 FINAL  Final  Urine culture     Status: None   Collection Time: 07/07/15  5:33 AM  Result Value Ref Range Status   Specimen Description URINE, RANDOM  Final   Special Requests Normal  Final   Culture NO GROWTH 1 DAY  Final   Report Status 07/08/2015 FINAL  Final     RADIOLOGY:  No results found.  EKG:   Orders placed or performed during the hospital encounter of 07/07/15  . EKG 12-Lead  . EKG 12-Lead      Management plans discussed with the patient, family and they are in agreement.  CODE STATUS:     Code Status Orders        Start     Ordered   07/07/15 0939  Full code   Continuous     07/07/15 0938    Code Status History    Date Active Date Inactive Code Status Order ID Comments User Context   06/23/2015 10:42 PM 06/27/2015  7:28 PM Full Code [id number]  Nicholes Mango, MD ED    Advance Directive Documentation        Most Recent Value   Type of Advance Directive  Living will   Pre-existing out of facility DNR order (yellow form or pink MOST form)     "MOST" Form in Place?        TOTAL TIME TAKING CARE OF THIS PATIENT: 45  minutes.    @[facility] @  on 07/12/2015 at 12:47 PM  Between 7am to 6pm - Pager - [phone]  After 6pm go to [email or web address] - password [email or web address]  Office  [phone]  CC: Primary care physician; [name], MD

## 2015-07-12 NOTE — Care Management (Signed)
Physical therapy evaluation completed. Recommending home with home health & physical therapy. Followed by [facility].  Will fax a resumption of services to [facility]. Discharge to home today per Dr. Margaretmary Eddy. Wife will transport. Shelbie Ammons RN MSN CCM Care Management [phone]

## 2015-07-12 NOTE — Care Management Important Message (Signed)
Important Message  Patient Details  Name: Douglas Perkins. MRN: [id number] Date of Birth: 12/16/1945   Medicare Important Message Given:  Yes    Juliann Pulse A [name] 07/12/2015, 10:28 AM
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
## 2015-07-12 NOTE — Care Management Important Message (Signed)
Important Message  Patient Details  Name: Douglas Perkins. MRN: [id number] Date of Birth: 1945/09/24   Medicare Important Message Given:  Yes    Juliann Pulse A Adante Courington 07/12/2015, 10:10 AM

## 2015-07-12 NOTE — Plan of Care (Signed)
Pt d/ced home.  Cardiologist released pt.  Spoke to hospitalist abt persistent elevated BP.  She added hydralazine.  Dr. Also instructed wife to check BP before she gives - if 140 or less - hold hydralazine.  Reviewed scripts and f/u appts - Pt's Na is stable and K+ improved.  CK not done today but has been improving.  Tegretol level slightly high but dr doesn't think this is cause of symptoms.  Pt eating and drinking well.  IV removed by nurse tech and pt left for home w/wife and friend.

## 2015-07-12 NOTE — Progress Notes (Signed)
[facility]  ROUNDING NOTE   Subjective:   S Cr 0.63 Na 131, stable No c/o   Objective:  Vital signs in last 24 hours:  Temp:  [97.5 F (36.4 C)-98.2 F (36.8 C)] 97.5 F (36.4 C) (03/13 [id number]) Pulse Rate:  [67-74] 68 (03/13 1135) Resp:  [20] 20 (03/13 0613) BP: (139-178)/(61-79) 139/61 mmHg (03/13 1135) SpO2:  [99 %-100 %] 99 % (03/13 0613)  Weight change:  Filed Weights   07/07/15 0524 07/07/15 1000  Weight: 85.2 kg (187 lb 13.3 oz) 83 kg (182 lb 15.7 oz)    Intake/Output: I/O last 3 completed shifts: In: 57 [P.O.:720] Out: 2725 [Urine:2725]   Intake/Output this shift:  Total I/O In: 240 [P.O.:240] Out: 500 [Urine:500]  Physical Exam: General: NAD  Head: Normocephalic, atraumatic. Moist oral mucosal membranes  Eyes: Anicteric,    Neck: Supple, trachea midline  Lungs:  Clear to auscultation  Heart: Regular rate and rhythm  Abdomen:  Soft, nontender,   Extremities: no peripheral edema.  Neurologic: Nonfocal, moving all four extremities  Skin: No lesions       Basic Metabolic Panel:  Recent Labs Lab 07/07/15 1038 07/07/15 1751 07/08/15 0514 07/09/15 0552 07/11/15 0453 07/12/15 0400  NA 133* 134* 129* 129* 131* 131*  K 4.4  --  4.5 4.1 3.3* 3.7  CL 101  --  97* 98* 101 100*  CO2 23  --  28 26 23 26   GLUCOSE 121*  --  98 115* 95 97  BUN 23*  --  18 11 7 7   CREATININE 1.31*  --  0.84 0.63 0.53* 0.63  CALCIUM 8.2*  --  8.1* 8.3* 7.9* 8.3*    Liver Function Tests:  Recent Labs Lab 07/07/15 0532  AST 69*  ALT 84*  ALKPHOS 118  BILITOT 0.2*  PROT 7.5  ALBUMIN 3.4*    Recent Labs Lab 07/07/15 0532  LIPASE 30   No results for input(s): AMMONIA in the last 168 hours.  CBC:  Recent Labs Lab 07/07/15 0532 07/08/15 0514 07/11/15 0453  WBC 9.5 8.0 8.3  NEUTROABS 8.2*  --   --   HGB 9.3* 9.9* 8.5*  HCT 28.6* 30.1* 25.7*  MCV 81.5 82.3 79.8*  PLT 416 402 307    Cardiac Enzymes:  Recent Labs Lab 07/07/15 0532  07/08/15 0514 07/09/15 0552 07/09/15 1353 07/10/15 0516 07/11/15 0453  CKTOTAL 749* 4085* 2514* 2928* 1498* 719*  TROPONINI 0.06*  --   --   --   --   --     BNP: Invalid input(s): POCBNP  CBG:  Recent Labs Lab 07/07/15 0942 07/08/15 2331  GLUCAP 131* 128*    Microbiology: Results for orders placed or performed during the hospital encounter of 07/07/15  Culture, blood (routine x 2)     Status: None   Collection Time: 07/07/15  5:32 AM  Result Value Ref Range Status   Specimen Description BLOOD LEFT ANTECUBITAL  Final   Special Requests BOTTLES DRAWN AEROBIC AND ANAEROBIC 5ML  Final   Culture NO GROWTH 5 DAYS  Final   Report Status 07/12/2015 FINAL  Final  Culture, blood (routine x 2)     Status: None   Collection Time: 07/07/15  5:33 AM  Result Value Ref Range Status   Specimen Description BLOOD LEFT HAND  Final   Special Requests BOTTLES DRAWN AEROBIC AND ANAEROBIC 5ML  Final   Culture NO GROWTH 5 DAYS  Final   Report Status 07/12/2015 FINAL  Final  Urine culture     Status: None   Collection Time: 07/07/15  5:33 AM  Result Value Ref Range Status   Specimen Description URINE, RANDOM  Final   Special Requests Normal  Final   Culture NO GROWTH 1 DAY  Final   Report Status 07/08/2015 FINAL  Final    Coagulation Studies: No results for input(s): LABPROT, INR in the last 72 hours.  Urinalysis: No results for input(s): COLORURINE, LABSPEC, PHURINE, GLUCOSEU, HGBUR, BILIRUBINUR, KETONESUR, PROTEINUR, UROBILINOGEN, NITRITE, LEUKOCYTESUR in the last 72 hours.  Invalid input(s): APPERANCEUR    Imaging: No results found.   Medications:     . amLODipine  5 mg Oral Daily  . ARIPiprazole  5 mg Oral BID  . calcium gluconate  1 g Intravenous Once  . carbamazepine  600 mg Oral BID  . dabigatran  150 mg Oral Q12H  . ferrous sulfate  325 mg Oral BID WC  . gabapentin  300 mg Oral BID  . hydrALAZINE  25 mg Oral 3 times per day  . latanoprost  1 drop Both Eyes QHS   . lisinopril  20 mg Oral Daily  . metoprolol succinate  50 mg Oral Daily  . pantoprazole  40 mg Oral BID  . potassium chloride  20 mEq Oral BID  . QUEtiapine  600 mg Oral QHS  . timolol  1 drop Right Eye Daily  . vancomycin  1,000 mg Intravenous Once   acetaminophen, calcium carbonate, guaiFENesin-dextromethorphan, temazepam  Assessment/ Plan:  [name]. is a 70 y.o. white male with hypertension, glaucoma, merkel cell cancer, prostate cancer, TIA, bipolar disorder, AICD, who was admitted to [facility] on 07/07/2015  1. Acute renal failure - creatinine back to baseline 2. Hyperkalemia - now normal 3. Hyponatremia- borderline low - follow thirst. No need to over hydrate. 4. Hypertension 5. Rhabdomyolysis - improved      LOS: 5 Douglas Perkins 3/13/201711:55 AM

## 2015-07-12 NOTE — Progress Notes (Signed)
Pt BP improved this AM. Was able to sleep most of the night after receiving HS meds, tylenol and a sleeping pill. Potassium improved as well. No pauses on the monitor this shift.

## 2015-07-12 NOTE — Discharge Instructions (Signed)
Activity as tolerated per PT recommendations, home health PT and RN Diet-cardiac Follow-up with primary care physician in a week and PCP to consider repeating BMP in 1 week Follow-up with nephrology and cardiology as recommended

## 2015-07-19 ENCOUNTER — Observation Stay
Admission: EM | Admit: 2015-07-19 | Discharge: 2015-07-20 | Disposition: A | Discharge status: Home / Self Care | Payer: Medicare Other | Attending: Internal Medicine | Admitting: Internal Medicine

## 2015-07-19 ENCOUNTER — Emergency Department: Payer: Medicare Other

## 2015-07-19 ENCOUNTER — Encounter: Payer: Self-pay | Admitting: Internal Medicine

## 2015-07-19 DIAGNOSIS — R948 Abnormal results of function studies of other organs and systems: Secondary | ICD-10-CM | POA: Diagnosis not present

## 2015-07-19 DIAGNOSIS — I252 Old myocardial infarction: Secondary | ICD-10-CM | POA: Diagnosis not present

## 2015-07-19 DIAGNOSIS — I959 Hypotension, unspecified: Secondary | ICD-10-CM | POA: Diagnosis present

## 2015-07-19 DIAGNOSIS — Z882 Allergy status to sulfonamides status: Secondary | ICD-10-CM | POA: Diagnosis not present

## 2015-07-19 DIAGNOSIS — Z9049 Acquired absence of other specified parts of digestive tract: Secondary | ICD-10-CM | POA: Insufficient documentation

## 2015-07-19 DIAGNOSIS — E860 Dehydration: Secondary | ICD-10-CM | POA: Insufficient documentation

## 2015-07-19 DIAGNOSIS — Z8701 Personal history of pneumonia (recurrent): Secondary | ICD-10-CM | POA: Insufficient documentation

## 2015-07-19 DIAGNOSIS — F319 Bipolar disorder, unspecified: Secondary | ICD-10-CM | POA: Diagnosis not present

## 2015-07-19 DIAGNOSIS — Z888 Allergy status to other drugs, medicaments and biological substances status: Secondary | ICD-10-CM | POA: Diagnosis not present

## 2015-07-19 DIAGNOSIS — R4182 Altered mental status, unspecified: Secondary | ICD-10-CM | POA: Diagnosis not present

## 2015-07-19 DIAGNOSIS — J90 Pleural effusion, not elsewhere classified: Secondary | ICD-10-CM | POA: Insufficient documentation

## 2015-07-19 DIAGNOSIS — I10 Essential (primary) hypertension: Secondary | ICD-10-CM | POA: Diagnosis not present

## 2015-07-19 DIAGNOSIS — Z9079 Acquired absence of other genital organ(s): Secondary | ICD-10-CM | POA: Diagnosis not present

## 2015-07-19 DIAGNOSIS — Z8572 Personal history of non-Hodgkin lymphomas: Secondary | ICD-10-CM | POA: Diagnosis not present

## 2015-07-19 DIAGNOSIS — Z7901 Long term (current) use of anticoagulants: Secondary | ICD-10-CM | POA: Diagnosis not present

## 2015-07-19 DIAGNOSIS — Z87891 Personal history of nicotine dependence: Secondary | ICD-10-CM | POA: Insufficient documentation

## 2015-07-19 DIAGNOSIS — I700 Atherosclerosis of aorta: Secondary | ICD-10-CM | POA: Diagnosis not present

## 2015-07-19 DIAGNOSIS — G453 Amaurosis fugax: Secondary | ICD-10-CM | POA: Diagnosis not present

## 2015-07-19 DIAGNOSIS — Z85821 Personal history of Merkel cell carcinoma: Secondary | ICD-10-CM | POA: Diagnosis not present

## 2015-07-19 DIAGNOSIS — Z950 Presence of cardiac pacemaker: Secondary | ICD-10-CM | POA: Diagnosis not present

## 2015-07-19 DIAGNOSIS — H409 Unspecified glaucoma: Secondary | ICD-10-CM | POA: Diagnosis not present

## 2015-07-19 DIAGNOSIS — Z9889 Other specified postprocedural states: Secondary | ICD-10-CM | POA: Diagnosis not present

## 2015-07-19 DIAGNOSIS — R0602 Shortness of breath: Secondary | ICD-10-CM | POA: Insufficient documentation

## 2015-07-19 DIAGNOSIS — Z8249 Family history of ischemic heart disease and other diseases of the circulatory system: Secondary | ICD-10-CM | POA: Insufficient documentation

## 2015-07-19 DIAGNOSIS — R55 Syncope and collapse: Principal | ICD-10-CM | POA: Insufficient documentation

## 2015-07-19 DIAGNOSIS — Z79899 Other long term (current) drug therapy: Secondary | ICD-10-CM | POA: Insufficient documentation

## 2015-07-19 DIAGNOSIS — Z8673 Personal history of transient ischemic attack (TIA), and cerebral infarction without residual deficits: Secondary | ICD-10-CM | POA: Diagnosis not present

## 2015-07-19 DIAGNOSIS — Z91048 Other nonmedicinal substance allergy status: Secondary | ICD-10-CM | POA: Diagnosis not present

## 2015-07-19 DIAGNOSIS — E871 Hypo-osmolality and hyponatremia: Secondary | ICD-10-CM | POA: Diagnosis not present

## 2015-07-19 DIAGNOSIS — I2510 Atherosclerotic heart disease of native coronary artery without angina pectoris: Secondary | ICD-10-CM | POA: Diagnosis not present

## 2015-07-19 DIAGNOSIS — R918 Other nonspecific abnormal finding of lung field: Secondary | ICD-10-CM | POA: Insufficient documentation

## 2015-07-19 DIAGNOSIS — R079 Chest pain, unspecified: Secondary | ICD-10-CM | POA: Insufficient documentation

## 2015-07-19 DIAGNOSIS — I236 Thrombosis of atrium, auricular appendage, and ventricle as current complications following acute myocardial infarction: Secondary | ICD-10-CM | POA: Insufficient documentation

## 2015-07-19 DIAGNOSIS — R42 Dizziness and giddiness: Secondary | ICD-10-CM | POA: Diagnosis not present

## 2015-07-19 HISTORY — DX: Merkel cell carcinoma, unspecified: C4A.9

## 2015-07-19 HISTORY — DX: Intracardiac thrombosis, not elsewhere classified: I51.3

## 2015-07-19 LAB — COMPREHENSIVE METABOLIC PANEL
ALT: 39 U/L (ref 17–63)
ANION GAP: 7 (ref 5–15)
AST: 34 U/L (ref 15–41)
Albumin: 3.3 g/dL — ABNORMAL LOW (ref 3.5–5.0)
Alkaline Phosphatase: 89 U/L (ref 38–126)
BILIRUBIN TOTAL: 0.3 mg/dL (ref 0.3–1.2)
BUN: 16 mg/dL (ref 6–20)
CHLORIDE: 100 mmol/L — AB (ref 101–111)
CO2: 27 mmol/L (ref 22–32)
Calcium: 9.2 mg/dL (ref 8.9–10.3)
Creatinine, Ser: 0.78 mg/dL (ref 0.61–1.24)
GFR calc Af Amer: >60 mL/min (ref >60)
Glucose, Bld: 129 mg/dL — ABNORMAL HIGH (ref 65–99)
POTASSIUM: 3.7 mmol/L (ref 3.5–5.1)
Sodium: 134 mmol/L — ABNORMAL LOW (ref 135–145)
TOTAL PROTEIN: 7 g/dL (ref 6.5–8.1)

## 2015-07-19 LAB — URINALYSIS COMPLETE WITH MICROSCOPIC (ARMC ONLY)
BACTERIA UA: NONE SEEN
BILIRUBIN URINE: NEGATIVE
GLUCOSE, UA: NEGATIVE mg/dL
Hgb urine dipstick: NEGATIVE
Ketones, ur: NEGATIVE mg/dL
Leukocytes, UA: NEGATIVE
NITRITE: NEGATIVE
Protein, ur: NEGATIVE mg/dL
SPECIFIC GRAVITY, URINE: 1.018 (ref 1.005–1.030)
Squamous Epithelial / LPF: NONE SEEN
pH: 6 (ref 5.0–8.0)

## 2015-07-19 LAB — APTT: APTT: 36 s (ref 24–36)

## 2015-07-19 LAB — CBC WITH DIFFERENTIAL/PLATELET
BASOS ABS: 0.1 10*3/uL (ref 0–0.1)
Basophils Relative: 1 %
Eosinophils Absolute: 0.1 10*3/uL (ref 0–0.7)
Eosinophils Relative: 2 %
HEMATOCRIT: 30.1 % — AB (ref 40.0–52.0)
HEMOGLOBIN: 10 g/dL — AB (ref 13.0–18.0)
LYMPHS ABS: 1.9 10*3/uL (ref 1.0–3.6)
LYMPHS PCT: 25 %
MCH: 26.9 pg (ref 26.0–34.0)
MCHC: 33.2 g/dL (ref 32.0–36.0)
MCV: 81.2 fL (ref 80.0–100.0)
Monocytes Absolute: 0.9 10*3/uL (ref 0.2–1.0)
Monocytes Relative: 13 %
NEUTROS ABS: 4.5 10*3/uL (ref 1.4–6.5)
NEUTROS PCT: 59 %
PLATELETS: 451 10*3/uL — AB (ref 150–440)
RBC: 3.7 MIL/uL — AB (ref 4.40–5.90)
RDW: 19.1 % — ABNORMAL HIGH (ref 11.5–14.5)
WBC: 7.5 10*3/uL (ref 3.8–10.6)

## 2015-07-19 LAB — TYPE AND SCREEN
ABO/RH(D): A POS
ANTIBODY SCREEN: NEGATIVE

## 2015-07-19 LAB — PROTIME-INR
INR: 1.18
PROTHROMBIN TIME: 15.2 s — AB (ref 11.4–15.0)

## 2015-07-19 LAB — BRAIN NATRIURETIC PEPTIDE: B Natriuretic Peptide: 205 pg/mL — ABNORMAL HIGH (ref 0.0–100.0)

## 2015-07-19 LAB — TROPONIN I: Troponin I: 0.03 ng/mL (ref <0.031)

## 2015-07-19 LAB — LACTIC ACID, PLASMA: LACTIC ACID, VENOUS: 1.2 mmol/L (ref 0.5–2.0)

## 2015-07-19 LAB — ABO/RH: ABO/RH(D): A POS

## 2015-07-19 MED ORDER — ARIPIPRAZOLE 10 MG PO TABS
5.0000 mg | ORAL_TABLET | Freq: Two times a day (BID) | ORAL | Status: DC
  Administered 2015-07-19 – 2015-07-20 (×2): 5 mg via ORAL
  Filled 2015-07-19 (×2): qty 1

## 2015-07-19 MED ORDER — POTASSIUM CHLORIDE CRYS ER 20 MEQ PO TBCR
20.0000 meq | EXTENDED_RELEASE_TABLET | Freq: Every day | ORAL | Status: DC
Start: 2015-07-19 — End: 2015-07-20
  Filled 2015-07-19: qty 1

## 2015-07-19 MED ORDER — CARBAMAZEPINE 200 MG PO TABS
600.0000 mg | ORAL_TABLET | Freq: Two times a day (BID) | ORAL | Status: DC
  Administered 2015-07-19 – 2015-07-20 (×2): 600 mg via ORAL
  Filled 2015-07-19 (×2): qty 3

## 2015-07-19 MED ORDER — ATORVASTATIN CALCIUM 20 MG PO TABS
20.0000 mg | ORAL_TABLET | Freq: Every day | ORAL | Status: DC
  Administered 2015-07-19: 20 mg via ORAL
  Filled 2015-07-19: qty 1

## 2015-07-19 MED ORDER — TEMAZEPAM 15 MG PO CAPS
15.0000 mg | ORAL_CAPSULE | Freq: Every evening | ORAL | Status: DC | PRN
  Administered 2015-07-19: 15 mg via ORAL
  Filled 2015-07-19: qty 1

## 2015-07-19 MED ORDER — TIMOLOL MALEATE 0.5 % OP SOLN
1.0000 [drp] | Freq: Every day | OPHTHALMIC | Status: DC
  Administered 2015-07-20: 1 [drp] via OPHTHALMIC
  Filled 2015-07-19: qty 5

## 2015-07-19 MED ORDER — TIMOLOL HEMIHYDRATE 0.5 % OP SOLN: 1.0000 [drp] | Freq: Every day | OPHTHALMIC | Status: DC

## 2015-07-19 MED ORDER — SODIUM CHLORIDE 0.9% FLUSH
3.0000 mL | Freq: Two times a day (BID) | INTRAVENOUS | Status: DC
  Administered 2015-07-19 – 2015-07-20 (×3): 3 mL via INTRAVENOUS

## 2015-07-19 MED ORDER — AMLODIPINE BESYLATE 5 MG PO TABS
5.0000 mg | ORAL_TABLET | Freq: Every day | ORAL | Status: DC
  Filled 2015-07-19: qty 1

## 2015-07-19 MED ORDER — QUETIAPINE FUMARATE 300 MG PO TABS
600.0000 mg | ORAL_TABLET | Freq: Every day | ORAL | Status: DC
  Administered 2015-07-19: 600 mg via ORAL
  Filled 2015-07-19 (×2): qty 2

## 2015-07-19 MED ORDER — ONDANSETRON HCL 4 MG/2ML IJ SOLN: 4.0000 mg | Freq: Four times a day (QID) | INTRAMUSCULAR | Status: DC | PRN

## 2015-07-19 MED ORDER — LISINOPRIL 20 MG PO TABS
40.0000 mg | ORAL_TABLET | Freq: Every day | ORAL | Status: DC
  Administered 2015-07-20: 40 mg via ORAL
  Filled 2015-07-19: qty 2

## 2015-07-19 MED ORDER — GABAPENTIN 300 MG PO CAPS
300.0000 mg | ORAL_CAPSULE | Freq: Two times a day (BID) | ORAL | Status: DC
  Administered 2015-07-19 – 2015-07-20 (×2): 300 mg via ORAL
  Filled 2015-07-19 (×2): qty 1

## 2015-07-19 MED ORDER — ONDANSETRON HCL 4 MG PO TABS: 4.0000 mg | ORAL_TABLET | Freq: Four times a day (QID) | ORAL | Status: DC | PRN

## 2015-07-19 MED ORDER — PANTOPRAZOLE SODIUM 40 MG PO TBEC
40.0000 mg | DELAYED_RELEASE_TABLET | Freq: Two times a day (BID) | ORAL | Status: DC
  Administered 2015-07-20: 40 mg via ORAL
  Filled 2015-07-19 (×2): qty 1

## 2015-07-19 MED ORDER — SODIUM CHLORIDE 0.9 % IV SOLN
1000.0000 mL | Freq: Once | INTRAVENOUS | Status: AC
  Administered 2015-07-19: 1000 mL via INTRAVENOUS

## 2015-07-19 MED ORDER — DABIGATRAN ETEXILATE MESYLATE 150 MG PO CAPS
150.0000 mg | ORAL_CAPSULE | Freq: Two times a day (BID) | ORAL | Status: DC
  Administered 2015-07-19 – 2015-07-20 (×2): 150 mg via ORAL
  Filled 2015-07-19 (×2): qty 1

## 2015-07-19 MED ORDER — POLYETHYLENE GLYCOL 3350 17 G PO PACK: 17.0000 g | PACK | Freq: Every day | ORAL | Status: DC | PRN

## 2015-07-19 MED ORDER — METOPROLOL SUCCINATE ER 50 MG PO TB24
50.0000 mg | ORAL_TABLET | Freq: Two times a day (BID) | ORAL | Status: DC
  Administered 2015-07-19 – 2015-07-20 (×2): 50 mg via ORAL
  Filled 2015-07-19 (×2): qty 1

## 2015-07-19 MED ORDER — ACETAMINOPHEN 650 MG RE SUPP
650.0000 mg | Freq: Four times a day (QID) | RECTAL | Status: DC | PRN
Start: 2015-07-19 — End: 2015-07-20

## 2015-07-19 MED ORDER — ACETAMINOPHEN 325 MG PO TABS: 650.0000 mg | ORAL_TABLET | Freq: Four times a day (QID) | ORAL | Status: DC | PRN

## 2015-07-19 MED ORDER — LATANOPROST 0.005 % OP SOLN
1.0000 [drp] | Freq: Every day | OPHTHALMIC | Status: DC
  Administered 2015-07-19: 1 [drp] via OPHTHALMIC
  Filled 2015-07-19: qty 2.5

## 2015-07-19 MED ORDER — SODIUM CHLORIDE 0.9 % IV BOLUS (SEPSIS): 500.0000 mL | Freq: Once | INTRAVENOUS | Status: DC

## 2015-07-19 MED ORDER — ADULT MULTIVITAMIN W/MINERALS CH
1.0000 | ORAL_TABLET | Freq: Every day | ORAL | Status: DC
Start: 2015-07-19 — End: 2015-07-20
  Administered 2015-07-20: 1 via ORAL
  Filled 2015-07-19: qty 1

## 2015-07-19 NOTE — H&P (Signed)
[facility] [place] NAME: Douglas Perkins    MR#:  [id number]  DATE OF BIRTH:  August 09, 1945  DATE OF ADMISSION:  07/19/2015  PRIMARY CARE PHYSICIAN: [name], MD   REQUESTING/REFERRING PHYSICIAN: Dr. Corky Downs  CHIEF COMPLAINT:   Chief Complaint  Patient presents with  . Hypotension    HISTORY OF PRESENT ILLNESS:  Douglas Perkins  is a 70 y.o. 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 Here patient's blood pressure has been stable. Initially patient was drowsy and is back to normal at this point. He still feels lightheaded on standing up in spite of 1 L normal saline fluids. Orthostatics were checked which were normal after fluid bolus. Troponin is normal. Pacemaker has been truncated with no arrhythmias found. No recent diarrhea, nausea, vomiting. Not on any diuretics.  PAST MEDICAL HISTORY:   Past Medical History  Diagnosis Date  . Hypertension   . Glaucoma   . Cancer (HCC)     merkel cell cancer  . Cancer [facility])     prostate  . TIA (transient ischemic attack)   . Bipolar 1 disorder ([place])   . Presence of permanent cardiac pacemaker   . Mural thrombus of cardiac apex (HCC)   . 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 Prostate surgery    . Cholecystectomy    . Prostatectomy    . Leg surgery Left     distal  . Skin cancer excision  [id number]    Merkle Cell Carcinoma  . Pace maker placement      SOCIAL HISTORY:   Social History  Substance Use Topics  . Smoking status: Former Research scientist (life sciences)  . Smokeless tobacco: Not on file  . Alcohol Use: No    FAMILY HISTORY:   Family History  Problem Relation Age of Onset  . Stroke  Father   . CAD Paternal Grandmother   . CAD Paternal Grandfather     DRUG ALLERGIES:   Allergies  Allergen Reactions  . Other Hives, Shortness Of Breath and Other (See Comments)    Pt states that he is allergic to unwashed blood products.    . Feldene [Piroxicam] Hives  . Sulfa Antibiotics Hives    REVIEW OF SYSTEMS:   Review of Systems  Constitutional: Positive for malaise/fatigue. Negative for fever, chills and weight loss.  HENT: Negative for hearing loss and nosebleeds.   Eyes: Negative for blurred vision, double vision and pain.  Respiratory: Negative for cough, hemoptysis, sputum production, shortness of breath and wheezing.   Cardiovascular: Negative for chest pain, palpitations, orthopnea and leg swelling.  Gastrointestinal: Negative for nausea, vomiting, abdominal pain, diarrhea and constipation.  Genitourinary: Negative for dysuria and hematuria.  Musculoskeletal: Negative for myalgias, back pain and falls.  Skin: Negative for rash.  Neurological: Positive for dizziness and loss of consciousness. Negative for tremors, sensory change, speech change, focal weakness, seizures and headaches.  Endo/Heme/Allergies: Does not bruise/bleed easily.  Psychiatric/Behavioral: Negative for depression and memory loss. The patient is not nervous/anxious.     MEDICATIONS AT HOME:   Prior to Admission medications   Medication Sig Start Date End Date Taking? Authorizing Provider  acetaminophen (TYLENOL) 325 MG tablet Take 2 tablets (650 mg  total) by mouth every 6 (six) hours as needed for mild pain or moderate pain. 07/12/15  Yes Nicholes Mango, MD  amLODipine (NORVASC) 5 MG tablet Take 5 mg by mouth daily.    Yes Historical Provider, MD  ARIPiprazole (ABILIFY) 5 MG tablet Take 5 mg by mouth 2 (two) times daily.    Yes Historical Provider, MD  atorvastatin (LIPITOR) 20 MG tablet Take 1 tablet (20 mg total) by mouth daily at 6 PM. 06/27/15  Yes Henreitta Leber, MD  carbamazepine (TEGRETOL)  200 MG tablet Take 600 mg by mouth 2 (two) times daily.   Yes Historical Provider, MD  dabigatran (PRADAXA) 150 MG CAPS capsule Take 150 mg by mouth 2 (two) times daily.   Yes Historical Provider, MD  esomeprazole (NEXIUM) 20 MG capsule Take 20 mg by mouth 2 (two) times daily before a meal.   Yes Historical Provider, MD  ferrous sulfate 325 (65 FE) MG tablet Take 650 mg by mouth 3 (three) times daily with meals.    Yes Historical Provider, MD  gabapentin (NEURONTIN) 300 MG capsule Take 300 mg by mouth 2 (two) times daily.   Yes Historical Provider, MD  glucosamine-chondroitin 500-400 MG tablet Take 1 tablet by mouth daily.   Yes Historical Provider, MD  latanoprost (XALATAN) 0.005 % ophthalmic solution Place 1 drop into both eyes at bedtime.   Yes Historical Provider, MD  lisinopril (PRINIVIL,ZESTRIL) 40 MG tablet Take 40 mg by mouth daily.   Yes Historical Provider, MD  metoprolol succinate (TOPROL-XL) 50 MG 24 hr tablet Take 50 mg by mouth 2 (two) times daily. Take with or immediately following a meal.   Yes Historical Provider, MD  Multiple Vitamin (MULTIVITAMIN WITH MINERALS) TABS tablet Take 1 tablet by mouth daily.   Yes Historical Provider, MD  Omega-3 Fatty Acids (FISH OIL) 1000 MG CAPS Take 1,000 mg by mouth 2 (two) times daily.    Yes Historical Provider, MD  QUEtiapine (SEROQUEL) 300 MG tablet Take 600 mg by mouth at bedtime.   Yes Historical Provider, MD  temazepam (RESTORIL) 15 MG capsule Take 15 mg by mouth at bedtime as needed for sleep.   Yes Historical Provider, MD  timolol (BETIMOL) 0.5 % ophthalmic solution Place 1 drop into the right eye daily.   Yes Historical Provider, MD  potassium chloride SA (K-DUR,KLOR-CON) 20 MEQ tablet Take 1 tablet (20 mEq total) by mouth daily. 07/12/15   Nicholes Mango, MD     VITAL SIGNS:  Blood pressure 159/73, pulse 62, temperature 97.7 F (36.5 C), temperature source Oral, resp. rate 16, SpO2 98 %.  PHYSICAL EXAMINATION:  Physical Exam  GENERAL:   70 y.o.-year-old patient lying in the bed with no acute distress.  EYES: Pupils equal, round, reactive to light and accommodation. No scleral icterus. Extraocular muscles intact.  HEENT: Head atraumatic, normocephalic. Oropharynx and nasopharynx clear. No oropharyngeal erythema, moist oral mucosa  NECK:  Supple, no jugular venous distention. No thyroid enlargement, no tenderness.  LUNGS: Normal breath sounds bilaterally, no wheezing, rales, rhonchi. No use of accessory muscles of respiration.  CARDIOVASCULAR: S1, S2 normal. No murmurs, rubs, or gallops. Pacemaker site is clean and healing well ABDOMEN: Soft, nontender, nondistended. Bowel sounds present. No organomegaly or mass.  EXTREMITIES: No pedal edema, cyanosis, or clubbing. + 2 pedal & radial pulses b/l.   NEUROLOGIC: Cranial nerves II through XII are intact. No focal Motor or sensory deficits appreciated b/l PSYCHIATRIC: The patient is alert and oriented x 3. Good affect.  SKIN: No obvious rash, lesion, or ulcer.   LABORATORY PANEL:   CBC  Recent Labs Lab 07/19/15 0908  WBC 7.5  HGB 10.0*  HCT 30.1*  PLT 451*   ------------------------------------------------------------------------------------------------------------------  Chemistries   Recent Labs Lab 07/19/15 0908  NA 134*  K 3.7  CL 100*  CO2 27  GLUCOSE 129*  BUN 16  CREATININE 0.78  CALCIUM 9.2  AST 34  ALT 39  ALKPHOS 89  BILITOT 0.3   ------------------------------------------------------------------------------------------------------------------  Cardiac Enzymes  Recent Labs Lab 07/19/15 0908  TROPONINI 0.03   ------------------------------------------------------------------------------------------------------------------  RADIOLOGY:  Dg Chest Port 1 View  07/19/2015  CLINICAL DATA:  Hypotension EXAM: PORTABLE CHEST 1 VIEW COMPARISON:  07/07/2015 FINDINGS: Dual lead pacemaker unchanged. Heart size upper normal. Negative for heart failure.  Lungs are clear without infiltrate effusion or collapse. IMPRESSION: No active disease. Electronically Signed   By: Franchot Gallo M.D.   On: 07/19/2015 09:38     IMPRESSION AND PLAN:   * Syncope with hypotension Etiology unclear. Patient's orthostatic checked in the emergency room after 1 L normal saline bolus. He did have lightheadedness on standing up but no syncope here. His symptoms could have been due to dehydration. Will give him out of the 500 ML normal saline bolus. Admit under observation. Check 2 more sets of cardiac enzymes. Pacemaker has been interrogated and from what I see there have been no episodes of arrhythmias. We'll request cardiology evaluation by Dr. Nehemiah Massed.  * Hypertension Continue home medications.  * Cardiac mural thrombus diagnosed recently at [facility]. Continue Pradaxa.  * Mild hyponatremia likely from dehydration  * Anemia of chronic disease is stable  * DVT prophylaxis Patient is on Pradaxa  All the records are reviewed and case discussed with ED provider. Management plans discussed with the patient, family and they are in agreement.  CODE STATUS: FULL  TOTAL TIME TAKING CARE OF THIS PATIENT: 40 minutes.   Hillary Bow R M.D on 07/19/2015 at 2:16 PM  Between 7am to 6pm - Pager - [phone]  After 6pm go to [email or web address] - password [email or web address] [facility]  Office  [phone]  CC: Primary care physician; [name], MD  Note: This dictation was prepared with Dragon dictation along with smaller phrase technology. Any transcriptional errors that result from this process are unintentional.

## 2015-07-19 NOTE — ED Notes (Signed)
Pt feeling like normal again, would like to eat. Diet ordered.

## 2015-07-19 NOTE — ED Provider Notes (Signed)
[facility] Emergency Department Provider Note  ____________________________________________    I have reviewed the triage vital signs and the nursing notes.   HISTORY  Chief Complaint Hypotension    HPI Douglas Demille. is a 70 y.o. male who presents unresponsive. History is provided by wife. Patient agrees that he had pacemaker placed on March 3 at [facility]. After being discharged from there he was admitted at [facility] for dehydration. He has been doing well since discharge from [place] and went to see his primary care provider this morning and then to his cardiologist but when he arrived for his cardiologist appointment he had gone essentially unresponsive and they were unable to get a blood pressure.     Past Medical History  Diagnosis Date  . Hypertension   . Glaucoma   . Cancer (HCC)     merkel cell cancer  . Cancer [facility])     prostate  . TIA (transient ischemic attack)   . Bipolar 1 disorder ([place])   . Presence of permanent cardiac pacemaker     Patient Active Problem List   Diagnosis Date Noted  . Sepsis ([place]) 07/07/2015  . Bipolar I disorder ([place]) 06/24/2015  . Bilateral pneumonia 06/23/2015  . 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 Bradycardia 02/08/2015  . 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 AF (amaurosis fugax) 09/16/2014  . Benign essential HTN 09/16/2014  . Chest pain 09/16/2014  . Skin cyst 08/06/2012  . Personal history of lymphoma 08/06/2012  . Cancer [facility])     Past Surgical History  Procedure Laterality Date  . Prostate surgery    . Cholecystectomy    . Prostatectomy    . Leg surgery Left     distal  . Skin cancer excision  [id number]    Merkle Cell Carcinoma  . Winger maker placement      Current Outpatient Rx  Name  Route  Sig  Dispense  Refill  . acetaminophen (TYLENOL) 325 MG tablet   Oral   Take 2 tablets (650 mg total) by mouth every 6 (six) hours  as needed for mild pain or moderate pain.         Marland Kitchen amLODipine (NORVASC) 5 MG tablet   Oral   Take 5 mg by mouth daily.          . ARIPiprazole (ABILIFY) 5 MG tablet   Oral   Take 5 mg by mouth 2 (two) times daily.          Marland Kitchen atorvastatin (LIPITOR) 20 MG tablet   Oral   Take 1 tablet (20 mg total) by mouth daily at 6 PM.         . carbamazepine (TEGRETOL) 200 MG tablet   Oral   Take 600 mg by mouth 2 (two) times daily.         . dabigatran (PRADAXA) 150 MG CAPS capsule   Oral   Take 150 mg by mouth 2 (two) times daily.         Marland Kitchen esomeprazole (NEXIUM) 20 MG capsule   Oral   Take 20 mg by mouth 2 (two) times daily before a meal.         . ferrous sulfate 325 (65 FE) MG tablet   Oral   Take 650 mg by mouth 3 (three) times daily with meals.          . gabapentin (NEURONTIN) 300 MG capsule   Oral   Take  300 mg by mouth 2 (two) times daily.         Marland Kitchen glucosamine-chondroitin 500-400 MG tablet   Oral   Take 1 tablet by mouth daily.         Marland Kitchen latanoprost (XALATAN) 0.005 % ophthalmic solution   Both Eyes   Place 1 drop into both eyes at bedtime.         Marland Kitchen lisinopril (PRINIVIL,ZESTRIL) 40 MG tablet   Oral   Take 40 mg by mouth daily.         . metoprolol succinate (TOPROL-XL) 50 MG 24 hr tablet   Oral   Take 50 mg by mouth 2 (two) times daily. Take with or immediately following a meal.         . Multiple Vitamin (MULTIVITAMIN WITH MINERALS) TABS tablet   Oral   Take 1 tablet by mouth daily.         . Omega-3 Fatty Acids (FISH OIL) 1000 MG CAPS   Oral   Take 1,000 mg by mouth 2 (two) times daily.          . QUEtiapine (SEROQUEL) 300 MG tablet   Oral   Take 600 mg by mouth at bedtime.         . temazepam (RESTORIL) 15 MG capsule   Oral   Take 15 mg by mouth at bedtime as needed for sleep.         Marland Kitchen timolol (BETIMOL) 0.5 % ophthalmic solution   Right Eye   Place 1 drop into the right eye daily.         . potassium chloride SA  (K-DUR,KLOR-CON) 20 MEQ tablet   Oral   Take 1 tablet (20 mEq total) by mouth daily.   15 tablet   0     Allergies Other; Feldene; and Sulfa antibiotics  Family History  Problem Relation Age of Onset  . Stroke Father   . CAD Paternal Grandmother   . CAD Paternal Grandfather     Social History Social History  Substance Use Topics  . Smoking status: Former Research scientist (life sciences)  . Smokeless tobacco: Not on file  . Alcohol Use: No    Level V caveat: Unable to obtain review of systems given patient distress    ____________________________________________   PHYSICAL EXAM: BP 159/73 mmHg  Pulse 62  Temp(Src) 97.7 F (36.5 C) (Oral)  Resp 16  SpO2 98%   VITAL SIGNS: ED Triage Vitals  Enc Vitals Group     BP --      Pulse --      Resp --      Temp --      Temp src --      SpO2 --      Weight --      Height --      Head Cir --      Peak Flow --      Pain Score --      Pain Loc --      Pain Edu? --      Excl. in [place]? --      Constitutional: Alert and oriented. Well appearing and in no distress.  Eyes: Conjunctivae are normal. No erythema or injection ENT   Head: Normocephalic and atraumatic.   Mouth/Throat: Mucous membranes are moist. Cardiovascular: Normal rate, regular rhythm. Normal and symmetric distal pulses are present in the upper extremities. No murmurs or rubs  Respiratory: Normal respiratory effort without tachypnea nor retractions. Breath sounds are clear  and equal bilaterally.  Gastrointestinal: Soft and non-tender in all quadrants. No distention. There is no CVA tenderness. Genitourinary: deferred Musculoskeletal: Nontender with normal range of motion in all extremities. No lower extremity tenderness nor edema. Neurologic:  Normal speech and language. No gross focal neurologic deficits are appreciated. Skin:  Skin is warm, dry and intact. No rash noted. Psychiatric: Mood and affect are normal. Patient exhibits appropriate insight and  judgment.  ____________________________________________    LABS (pertinent positives/negatives)  Labs Reviewed  COMPREHENSIVE METABOLIC PANEL - Abnormal; Notable for the following:    Sodium 134 (*)    Chloride 100 (*)    Glucose, Bld 129 (*)    Albumin 3.3 (*)    All other components within normal limits  CBC WITH DIFFERENTIAL/PLATELET - Abnormal; Notable for the following:    RBC 3.70 (*)    Hemoglobin 10.0 (*)    HCT 30.1 (*)    RDW 19.1 (*)    Platelets 451 (*)    All other components within normal limits  BRAIN NATRIURETIC PEPTIDE - Abnormal; Notable for the following:    B Natriuretic Peptide 205.0 (*)    All other components within normal limits  URINALYSIS COMPLETEWITH MICROSCOPIC ([facility] ONLY) - Abnormal; Notable for the following:    Color, Urine YELLOW (*)    APPearance CLEAR (*)    All other components within normal limits  PROTIME-INR - Abnormal; Notable for the following:    Prothrombin Time 15.2 (*)    All other components within normal limits  CULTURE, BLOOD (ROUTINE X 2)  CULTURE, BLOOD (ROUTINE X 2)  URINE CULTURE  LACTIC ACID, PLASMA  TROPONIN I  APTT  LACTIC ACID, PLASMA  TYPE AND SCREEN  ABO/RH    ____________________________________________   EKG  ED ECG REPORT I, Lavonia Drafts, the attending physician, personally viewed and interpreted this ECG.   Date: 07/19/2015  EKG Time: 9:07 AM  Rate: 60  Rhythm: Atrial paced  Axis: Normal  Intervals:right bundle branch block and left anterior fascicular block  ST&T Change: T wave inversions V3 through V6 new from March 8   ____________________________________________    RADIOLOGY  Chest unremarkable  ____________________________________________   PROCEDURES  Procedure(s) performed: none  Critical Care performed: yes  CRITICAL CARE Performed by: Lavonia Drafts   Total critical care time:15 minutes  Critical care time was exclusive of separately billable procedures and  treating other patients.  Critical care was necessary to treat or prevent imminent or life-threatening deterioration.  Critical care was time spent personally by me on the following activities: development of treatment plan with patient and/or surrogate as well as nursing, discussions with consultants, evaluation of patient's response to treatment, examination of patient, obtaining history from patient or surrogate, ordering and performing treatments and interventions, ordering and review of laboratory studies, ordering and review of radiographic studies, pulse oximetry and re-evaluation of patient's condition.   ____________________________________________   INITIAL IMPRESSION / ASSESSMENT AND PLAN / ED COURSE  Pertinent labs & imaging results that were available during my care of the patient were reviewed by me and considered in my medical decision making (see chart for details).  Patient presents slumped over in wheelchair but able to answer questions very slowly. Immediately placed into stretcher, IV started, fluid started. While supine patient has improved significantly. He is now alert oriented his blood pressure is normalized. His EKG is quite concerning given the T-wave inversions laterally, I discussed this with Dr. Nehemiah Massed at length  Patient's labs are reassuring  however he does have somewhat concerning EKG changes. He is feeling significantly better. I did discuss his results with him extensively. We agreed that admission to the hospital for observation is the best course of action.  ____________________________________________   FINAL CLINICAL IMPRESSION(S) / ED DIAGNOSES  Final diagnoses:  Syncope, unspecified syncope type  Hypotension, unspecified          Lavonia Drafts, MD 07/19/15 1324

## 2015-07-19 NOTE — ED Notes (Signed)
Pt eating, pacemaker interrogated

## 2015-07-19 NOTE — ED Notes (Addendum)
Pt arrives from cardiology office, pt was in office for follow-up and they could not get a BP, upon arrival to Er pts BP in triage 62/38, pt letahrgic, head down, pale, pt able to state place and name, brought to room 8, EDP brought to bedside, pt had pacemaker placed this month

## 2015-07-19 NOTE — Care Management Obs Status (Signed)
[facility] NOTIFICATION   Patient Details  Name: Douglas Perkins. MRN: [id number] Date of Birth: [birth year]   Medicare Observation Status Notification Given:  Yes    Beau Fanny, RN 07/19/2015, 2:19 PM

## 2015-07-19 NOTE — Progress Notes (Signed)
A & O. No pain. Cardio consult was called. A paced. Room air. Pt has no further concerns at this time.

## 2015-07-19 NOTE — ED Notes (Signed)
X-ray at bedside

## 2015-07-20 DIAGNOSIS — R55 Syncope and collapse: Secondary | ICD-10-CM | POA: Diagnosis not present

## 2015-07-20 LAB — BASIC METABOLIC PANEL
ANION GAP: 4 — AB (ref 5–15)
BUN: 16 mg/dL (ref 6–20)
CALCIUM: 8.6 mg/dL — AB (ref 8.9–10.3)
CHLORIDE: 102 mmol/L (ref 101–111)
CO2: 27 mmol/L (ref 22–32)
CREATININE: 0.62 mg/dL (ref 0.61–1.24)
GFR calc Af Amer: >60 mL/min (ref >60)
GFR calc non Af Amer: >60 mL/min (ref >60)
GLUCOSE: 96 mg/dL (ref 65–99)
Potassium: 4 mmol/L (ref 3.5–5.1)
Sodium: 133 mmol/L — ABNORMAL LOW (ref 135–145)

## 2015-07-20 LAB — TROPONIN I: Troponin I: 0.03 ng/mL (ref <0.031)

## 2015-07-20 LAB — URINE CULTURE
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
## 2015-07-20 NOTE — Progress Notes (Signed)
Patient given discharge teaching and paperwork regarding medications, diet, follow-up appointments and activity. Patient understanding verbalized. No complaints at this time. IV and telemetry discontinued prior to leaving. Skin assessment as previously charted and vitals are stable; on room air. Patient being discharged to home.  No further needs by Care Management. No new prescriptions. Patient to ride home with wife.

## 2015-07-20 NOTE — Discharge Summary (Signed)
[place] [facility] [place] NAME: Douglas Perkins    MR#:  [id number]  DATE OF BIRTH:  1946/05/01  DATE OF ADMISSION:  07/19/2015 ADMITTING PHYSICIAN: Hillary Bow, MD  DATE OF DISCHARGE: *07/20/2015 12:03 PM  PRIMARY CARE PHYSICIAN: [name], MD    ADMISSION DIAGNOSIS:  Hypotension, unspecified [I95.9] Syncope, unspecified syncope type [R55]  DISCHARGE DIAGNOSIS:  Active Problems:   Syncope   SECONDARY DIAGNOSIS:   Past Medical History  Diagnosis Date  . Hypertension   . Glaucoma   . Cancer (HCC)     merkel cell cancer  . Cancer [facility])     prostate  . TIA (transient ischemic attack)   . Bipolar 1 disorder ([place])   . Presence of permanent cardiac pacemaker   . Mural thrombus of cardiac apex (HCC)   . 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 Please see H and P for details.  1.  Syncope in the setting of hypotension which was due to dehydration: His symptoms resolved and he had no syncopal events while in the hospital. Dr Nehemiah Massed recomended that Norvasc be discontinued for now. It was advised that he check and write down his BP three times a day.  2. CAD/apical thrombus: Continue Metoprolol, Lisinopril, statin and Pradaxa.  3, Essential HTN: Continue Metoprolol and Lisinopril with plan as above  4. BPAD: Continue outpatient medications  DISCHARGE CONDITIONS AND DIET:  Home discharge in stable condition Heart healthy diet  CONSULTS OBTAINED:  Treatment Team:  Corey Skains, MD  DRUG ALLERGIES:   Allergies  Allergen Reactions  . Other Hives, Shortness Of Breath and Other (See Comments)    Pt states that he is allergic to unwashed blood products.    . Feldene [Piroxicam] Hives  . Sulfa Antibiotics Hives    DISCHARGE MEDICATIONS:   Discharge Medication List as of 07/20/2015 11:12 AM     CONTINUE these medications which have NOT CHANGED   Details  acetaminophen (TYLENOL) 325 MG tablet Take 2 tablets (650 mg total) by mouth every 6 (six) hours as needed for mild pain or moderate pain., Starting 07/12/2015, Until Discontinued, OTC    ARIPiprazole (ABILIFY) 5 MG tablet Take 5 mg by mouth 2 (two) times daily. , Until Discontinued, Historical Med    atorvastatin (LIPITOR) 20 MG tablet Take 1 tablet (20 mg total) by mouth daily at 6 PM., Starting 06/27/2015, Until Discontinued, No Print    carbamazepine (TEGRETOL) 200 MG tablet Take 600 mg by mouth 2 (two) times daily., Until Discontinued, Historical Med    dabigatran (PRADAXA) 150 MG CAPS capsule Take 150 mg by mouth 2 (two) times daily., Until Discontinued, Historical Med    esomeprazole (NEXIUM) 20 MG capsule Take 20 mg by mouth 2 (two) times daily before a meal., Until Discontinued, Historical Med    ferrous sulfate 325 (65 FE) MG tablet Take 650 mg by mouth 3 (three) times daily with meals. , Until Discontinued, Historical Med    gabapentin (NEURONTIN) 300 MG capsule Take 300 mg by mouth 2 (two) times daily., Until Discontinued, Historical Med    glucosamine-chondroitin 500-400 MG tablet Take 1 tablet by mouth daily., Until Discontinued, Historical Med    latanoprost (XALATAN) 0.005 % ophthalmic solution Place 1 drop into both eyes at bedtime., Until Discontinued, Historical Med    lisinopril (PRINIVIL,ZESTRIL) 40 MG tablet Take  40 mg by mouth daily., Until Discontinued, Historical Med    metoprolol succinate (TOPROL-XL) 50 MG 24 hr tablet Take 50 mg by mouth 2 (two) times daily. Take with or immediately following a meal., Until Discontinued, Historical Med    Multiple Vitamin (MULTIVITAMIN WITH MINERALS) TABS tablet Take 1 tablet by mouth daily., Until Discontinued, Historical Med    Omega-3 Fatty Acids (FISH OIL) 1000 MG CAPS Take 1,000 mg by mouth 2 (two) times daily. , Until Discontinued, Historical Med    QUEtiapine  (SEROQUEL) 300 MG tablet Take 600 mg by mouth at bedtime., Until Discontinued, Historical Med    temazepam (RESTORIL) 15 MG capsule Take 15 mg by mouth at bedtime as needed for sleep., Until Discontinued, Historical Med    timolol (BETIMOL) 0.5 % ophthalmic solution Place 1 drop into the right eye daily., Until Discontinued, Historical Med    potassium chloride SA (K-DUR,KLOR-CON) 20 MEQ tablet Take 1 tablet (20 mEq total) by mouth daily., Starting 07/12/2015, Until Discontinued, Print      STOP taking these medications     amLODipine (NORVASC) 5 MG tablet               Today   CHIEF COMPLAINT:  Patient is teary in regards to talking about his wife who has dementia. No acute issues   VITAL SIGNS:  Blood pressure 148/70, pulse 68, temperature 97.8 F (36.6 C), temperature source Oral, resp. rate 18, height 6' (1.829 m), weight 79.652 kg (175 lb 9.6 oz), SpO2 95 %.   REVIEW OF SYSTEMS:  Review of Systems  Constitutional: Negative for fever, chills and malaise/fatigue.  HENT: Negative for ear discharge, ear pain, hearing loss, nosebleeds and sore throat.   Eyes: Negative for blurred vision and pain.  Respiratory: Negative for cough, hemoptysis, shortness of breath and wheezing.   Cardiovascular: Negative for chest pain, palpitations and leg swelling.  Gastrointestinal: Negative for nausea, vomiting, abdominal pain, diarrhea and blood in stool.  Genitourinary: Negative for dysuria.  Musculoskeletal: Negative for back pain.  Neurological: Negative for dizziness, tremors, speech change, focal weakness, seizures and headaches.  Endo/Heme/Allergies: Does not bruise/bleed easily.  Psychiatric/Behavioral: Negative for depression, suicidal ideas and hallucinations.     PHYSICAL EXAMINATION:  GENERAL:  70 y.o.-year-old patient lying in the bed with no acute distress.  NECK:  Supple, no jugular venous distention. No thyroid enlargement, no tenderness.  LUNGS: Normal breath  sounds bilaterally, no wheezing, rales,rhonchi  No use of accessory muscles of respiration.  CARDIOVASCULAR: S1, S2 normal. No murmurs, rubs, or gallops.  ABDOMEN: Soft, non-tender, non-distended. Bowel sounds present. No organomegaly or mass.  EXTREMITIES: No pedal edema, cyanosis, or clubbing.  PSYCHIATRIC: The patient is alert and oriented x 3.  SKIN: No obvious rash, lesion, or ulcer.   DATA REVIEW:   CBC  Recent Labs Lab 07/19/15 0908  WBC 7.5  HGB 10.0*  HCT 30.1*  PLT 451*    Chemistries   Recent Labs Lab 07/19/15 0908 07/20/15 0504  NA 134* 133*  K 3.7 4.0  CL 100* 102  CO2 27 27  GLUCOSE 129* 96  BUN 16 16  CREATININE 0.78 0.62  CALCIUM 9.2 8.6*  AST 34  --   ALT 39  --   ALKPHOS 89  --   BILITOT 0.3  --     Cardiac Enzymes  Recent Labs Lab 07/19/15 0908 07/19/15 1803 07/20/15 0036  TROPONINI 0.03 <0.03 0.03    Microbiology Results  @[email or web address] @  RADIOLOGY:  Dg  Chest Port 1 View  07/19/2015  CLINICAL DATA:  Hypotension EXAM: PORTABLE CHEST 1 VIEW COMPARISON:  07/07/2015 FINDINGS: Dual lead pacemaker unchanged. Heart size upper normal. Negative for heart failure. Lungs are clear without infiltrate effusion or collapse. IMPRESSION: No active disease. Electronically Signed   By: Franchot Gallo M.D.   On: 07/19/2015 09:38      Management plans discussed with the patient and he is in agreement. Stable for discharge home  Patient should follow up with PCP in 1 week and CARDIOLOGY 3 days  CODE STATUS:     Code Status Orders        Start     Ordered   07/19/15 1412  Full code   Continuous     07/19/15 1412    Code Status History    Date Active Date Inactive Code Status Order ID Comments User Context   07/07/2015  9:39 AM 07/12/2015  6:00 PM Full Code [id number]  Vaughan Basta, MD Inpatient   06/23/2015 10:42 PM 06/27/2015  7:28 PM Full Code [id number]  Nicholes Mango, MD ED    Advance Directive Documentation        Most Recent Value    Type of Advance Directive  Living will   Pre-existing out of facility DNR order (yellow form or pink MOST form)     "MOST" Form in Place?        TOTAL TIME TAKING CARE OF THIS PATIENT: 35 minutes.    Note: This dictation was prepared with Dragon dictation along with smaller phrase technology. Any transcriptional errors that result from this process are unintentional.  Daenerys Buttram M.D on 07/20/2015 at 1:11 PM  Between 7am to 6pm - Pager - [phone] After 6pm go to [email or web address] - password [email or web address] [facility]  Office  [phone]  CC: Primary care physician; [name], MD

## 2015-07-24 LAB — CULTURE, BLOOD (ROUTINE X 2)
Culture: NO GROWTH
Culture: NO GROWTH

## 2015-08-03 ENCOUNTER — Emergency Department
Admission: EM | Admit: 2015-08-03 | Discharge: 2015-08-04 | Disposition: A | Discharge status: Home / Self Care | Payer: Medicare Other | Attending: Emergency Medicine | Admitting: Emergency Medicine

## 2015-08-03 ENCOUNTER — Emergency Department: Payer: Medicare Other

## 2015-08-03 DIAGNOSIS — H409 Unspecified glaucoma: Secondary | ICD-10-CM | POA: Insufficient documentation

## 2015-08-03 DIAGNOSIS — Z87891 Personal history of nicotine dependence: Secondary | ICD-10-CM | POA: Diagnosis not present

## 2015-08-03 DIAGNOSIS — Z8572 Personal history of non-Hodgkin lymphomas: Secondary | ICD-10-CM | POA: Insufficient documentation

## 2015-08-03 DIAGNOSIS — Z7901 Long term (current) use of anticoagulants: Secondary | ICD-10-CM | POA: Insufficient documentation

## 2015-08-03 DIAGNOSIS — Z8673 Personal history of transient ischemic attack (TIA), and cerebral infarction without residual deficits: Secondary | ICD-10-CM | POA: Diagnosis not present

## 2015-08-03 DIAGNOSIS — Z7982 Long term (current) use of aspirin: Secondary | ICD-10-CM | POA: Insufficient documentation

## 2015-08-03 DIAGNOSIS — I10 Essential (primary) hypertension: Secondary | ICD-10-CM | POA: Insufficient documentation

## 2015-08-03 DIAGNOSIS — R55 Syncope and collapse: Secondary | ICD-10-CM | POA: Insufficient documentation

## 2015-08-03 DIAGNOSIS — A419 Sepsis, unspecified organism: Secondary | ICD-10-CM | POA: Diagnosis not present

## 2015-08-03 DIAGNOSIS — E785 Hyperlipidemia, unspecified: Secondary | ICD-10-CM | POA: Insufficient documentation

## 2015-08-03 DIAGNOSIS — Z79899 Other long term (current) drug therapy: Secondary | ICD-10-CM | POA: Insufficient documentation

## 2015-08-03 DIAGNOSIS — Z85821 Personal history of Merkel cell carcinoma: Secondary | ICD-10-CM | POA: Diagnosis not present

## 2015-08-03 DIAGNOSIS — F319 Bipolar disorder, unspecified: Secondary | ICD-10-CM | POA: Insufficient documentation

## 2015-08-03 DIAGNOSIS — Z8546 Personal history of malignant neoplasm of prostate: Secondary | ICD-10-CM | POA: Insufficient documentation

## 2015-08-03 DIAGNOSIS — R4182 Altered mental status, unspecified: Secondary | ICD-10-CM | POA: Diagnosis present

## 2015-08-03 LAB — CBC WITH DIFFERENTIAL/PLATELET
Basophils Absolute: 0.1 10*3/uL (ref 0–0.1)
Basophils Relative: 1 %
EOS PCT: 2 %
Eosinophils Absolute: 0.1 10*3/uL (ref 0–0.7)
HCT: 30.3 % — ABNORMAL LOW (ref 40.0–52.0)
Hemoglobin: 10.2 g/dL — ABNORMAL LOW (ref 13.0–18.0)
LYMPHS ABS: 1.5 10*3/uL (ref 1.0–3.6)
LYMPHS PCT: 27 %
MCH: 27.5 pg (ref 26.0–34.0)
MCHC: 33.7 g/dL (ref 32.0–36.0)
MCV: 81.6 fL (ref 80.0–100.0)
Monocytes Absolute: 0.8 10*3/uL (ref 0.2–1.0)
Monocytes Relative: 14 %
Neutro Abs: 3.3 10*3/uL (ref 1.4–6.5)
Neutrophils Relative %: 56 %
PLATELETS: 305 10*3/uL (ref 150–440)
RBC: 3.71 MIL/uL — AB (ref 4.40–5.90)
RDW: 21.8 % — ABNORMAL HIGH (ref 11.5–14.5)
WBC: 5.8 10*3/uL (ref 3.8–10.6)

## 2015-08-03 LAB — COMPREHENSIVE METABOLIC PANEL
ALK PHOS: 73 U/L (ref 38–126)
ALT: 16 U/L — AB (ref 17–63)
AST: 22 U/L (ref 15–41)
Albumin: 3.3 g/dL — ABNORMAL LOW (ref 3.5–5.0)
Anion gap: 7 (ref 5–15)
BUN: 15 mg/dL (ref 6–20)
CHLORIDE: 99 mmol/L — AB (ref 101–111)
CO2: 25 mmol/L (ref 22–32)
CREATININE: 0.64 mg/dL (ref 0.61–1.24)
Calcium: 8.5 mg/dL — ABNORMAL LOW (ref 8.9–10.3)
GFR calc Af Amer: >60 mL/min (ref >60)
GLUCOSE: 123 mg/dL — AB (ref 65–99)
POTASSIUM: 4 mmol/L (ref 3.5–5.1)
Sodium: 131 mmol/L — ABNORMAL LOW (ref 135–145)
Total Bilirubin: 0.4 mg/dL (ref 0.3–1.2)
Total Protein: 6.3 g/dL — ABNORMAL LOW (ref 6.5–8.1)

## 2015-08-03 LAB — TROPONIN I: Troponin I: <0.03 ng/mL (ref <0.031)

## 2015-08-03 MED ORDER — SODIUM CHLORIDE 0.9 % IV BOLUS (SEPSIS)
1000.0000 mL | Freq: Once | INTRAVENOUS | Status: AC
  Administered 2015-08-03: 1000 mL via INTRAVENOUS

## 2015-08-03 NOTE — ED Provider Notes (Addendum)
[facility] Emergency Department Provider Note  ____________________________________________  Time seen: Seen upon arrival to the emergency department  I have reviewed the triage vital signs and the nursing notes.   HISTORY  Chief Complaint Altered Mental Status    HPI Douglas Perkins. is a 70 y.o. male with a history of hypertension as well as recent pacemaker placement who is presenting to the emergency department after a syncopal episode. The patient at this time is over the details of the episode. EMS says that the patient did not have any seizure activity noted. There was no fall reported. Patient is denying any pain. He did have an episode of vomitus after passing out.   Past Medical History  Diagnosis Date  . Hypertension   . Glaucoma   . Cancer (HCC)     merkel cell cancer  . Cancer [facility])     prostate  . TIA (transient ischemic attack)   . Bipolar 1 disorder ([place])   . Presence of permanent cardiac pacemaker   . Mural thrombus of cardiac apex (HCC)   . 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 Syncope 07/19/2015  . Sepsis ([place]) 07/07/2015  . Bipolar I disorder ([place]) 06/24/2015  . Bilateral pneumonia 06/23/2015  . 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 Bradycardia 02/08/2015  . 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 AF (amaurosis fugax) 09/16/2014  . Benign essential HTN 09/16/2014  . Chest pain 09/16/2014  . Skin cyst 08/06/2012  . Personal history of lymphoma 08/06/2012  . Cancer [facility])     Past Surgical History  Procedure Laterality Date  . Prostate surgery    . Cholecystectomy    . Prostatectomy    . Leg surgery Left     distal  . Skin cancer excision  [id number]    Merkle Cell Carcinoma  . Despard maker placement      Current Outpatient Rx  Name  Route  Sig  Dispense  Refill  . acetaminophen (TYLENOL) 325 MG tablet   Oral   Take 2 tablets (650 mg  total) by mouth every 6 (six) hours as needed for mild pain or moderate pain.         . ARIPiprazole (ABILIFY) 5 MG tablet   Oral   Take 5 mg by mouth 2 (two) times daily.          Marland Kitchen atorvastatin (LIPITOR) 20 MG tablet   Oral   Take 1 tablet (20 mg total) by mouth daily at 6 PM.         . carbamazepine (TEGRETOL) 200 MG tablet   Oral   Take 600 mg by mouth 2 (two) times daily.         . dabigatran (PRADAXA) 150 MG CAPS capsule   Oral   Take 150 mg by mouth 2 (two) times daily.         Marland Kitchen esomeprazole (NEXIUM) 20 MG capsule   Oral   Take 20 mg by mouth 2 (two) times daily before a meal.         . ferrous sulfate 325 (65 FE) MG tablet   Oral   Take 650 mg by mouth 3 (three) times daily with meals.          . gabapentin (NEURONTIN) 300 MG capsule   Oral   Take 300 mg by mouth 2 (two) times daily.         Marland Kitchen  glucosamine-chondroitin 500-400 MG tablet   Oral   Take 1 tablet by mouth daily.         Marland Kitchen latanoprost (XALATAN) 0.005 % ophthalmic solution   Both Eyes   Place 1 drop into both eyes at bedtime.         Marland Kitchen lisinopril (PRINIVIL,ZESTRIL) 40 MG tablet   Oral   Take 40 mg by mouth daily.         . metoprolol succinate (TOPROL-XL) 50 MG 24 hr tablet   Oral   Take 50 mg by mouth 2 (two) times daily. Take with or immediately following a meal.         . Multiple Vitamin (MULTIVITAMIN WITH MINERALS) TABS tablet   Oral   Take 1 tablet by mouth daily.         . Omega-3 Fatty Acids (FISH OIL) 1000 MG CAPS   Oral   Take 1,000 mg by mouth 2 (two) times daily.          . potassium chloride SA (K-DUR,KLOR-CON) 20 MEQ tablet   Oral   Take 1 tablet (20 mEq total) by mouth daily.   15 tablet   0   . QUEtiapine (SEROQUEL) 300 MG tablet   Oral   Take 600 mg by mouth at bedtime.         . temazepam (RESTORIL) 15 MG capsule   Oral   Take 15 mg by mouth at bedtime as needed for sleep.         Marland Kitchen timolol (BETIMOL) 0.5 % ophthalmic solution    Right Eye   Place 1 drop into the right eye daily.           Allergies Other; Feldene; and Sulfa antibiotics  Family History  Problem Relation Age of Onset  . Stroke Father   . CAD Paternal Grandmother   . CAD Paternal Grandfather     Social History Social History  Substance Use Topics  . Smoking status: Former Research scientist (life sciences)  . Smokeless tobacco: None  . Alcohol Use: No    Review of Systems Constitutional: No fever/chills Eyes: No visual changes. ENT: No sore throat. Cardiovascular: Denies chest pain. Respiratory: Denies shortness of breath. Gastrointestinal: No abdominal pain.  No nausea, no vomiting.  No diarrhea.  No constipation. Genitourinary: Negative for dysuria. Musculoskeletal: Negative for back pain. Skin: Negative for rash. Neurological: Negative for headaches, focal weakness or numbness.  10-point ROS otherwise negative.  ____________________________________________   PHYSICAL EXAM:  VITAL SIGNS: ED Triage Vitals  Enc Vitals Group     BP 08/03/15 2124 88/55 mmHg     Pulse Rate 08/03/15 2124 63     Resp 08/03/15 2124 24     Temp 08/03/15 2124 97.6 F (36.4 C)     Temp Source 08/03/15 2124 Oral     SpO2 08/03/15 2124 92 %     Weight --      Height --      Head Cir --      Peak Flow --      Pain Score 08/03/15 2125 0     Pain Loc --      Pain Edu? --      Excl. in [place]? --     Constitutional: Alert and oriented. Well appearing and in no acute distress. Eyes: Conjunctivae are normal. PERRL. EOMI. Head: Atraumatic. Nose: No congestion/rhinnorhea. Mouth/Throat: Mucous membranes are moist.   Neck: No stridor.   Cardiovascular: Normal rate, regular rhythm. Grossly normal heart sounds.  Good peripheral circulation. Respiratory: Normal respiratory effort.  No retractions. Lungs CTAB. Gastrointestinal: Soft and nontender. No distention. No abdominal bruits. No CVA tenderness. Musculoskeletal: Mild bilateral lower extremity edema.   Neurologic:  Normal  speech and language. No gross focal neurologic deficits are appreciated.  Skin:  Skin is warm, dry and intact. No rash noted. Psychiatric: Mood and affect are normal. Speech and behavior are normal.  ____________________________________________   LABS (all labs ordered are listed, but only abnormal results are displayed)  Labs Reviewed  CBC WITH DIFFERENTIAL/PLATELET - Abnormal; Notable for the following:    RBC 3.71 (*)    Hemoglobin 10.2 (*)    HCT 30.3 (*)    RDW 21.8 (*)    All other components within normal limits  COMPREHENSIVE METABOLIC PANEL - Abnormal; Notable for the following:    Sodium 131 (*)    Chloride 99 (*)    Glucose, Bld 123 (*)    Calcium 8.5 (*)    Total Protein 6.3 (*)    Albumin 3.3 (*)    ALT 16 (*)    All other components within normal limits  TROPONIN I  TROPONIN I   ____________________________________________  EKG  ED ECG REPORT I, Doran Stabler, the attending physician, personally viewed and interpreted this ECG.   Date: 08/03/2015  EKG Time: 2106   Rate: 63   Rhythm: Sinus rhythm.  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 T-wave inversions in V3 and V4. V5 and V6 now upright when compared to last EKG on record.  ____________________________________________  RADIOLOGY   DG Chest 1 View (Final result) Result time: 08/03/15 21:31:17   Final result by Rad Results In Interface (08/03/15 21:31:17)   Narrative:   CLINICAL DATA: Productive cough for several weeks. Initial encounter.  EXAM: CHEST 1 VIEW  COMPARISON: Single view of the chest 07/19/2015 and 07/07/2015.  FINDINGS: The heart size and mediastinal contours are within normal limits. Both lungs are clear. Pacing device is noted, The visualized skeletal structures are unremarkable.  IMPRESSION: No active disease.   Electronically Signed By: Inge Rise  M.D. On: 08/03/2015 21:31    ____________________________________________   PROCEDURES    ____________________________________________   INITIAL IMPRESSION / ASSESSMENT AND PLAN / ED COURSE  Pertinent labs & imaging results that were available during my care of the patient were reviewed by me and considered in my medical decision making (see chart for details).  Pacer interrogated and there is no abnormality recorded.  ----------------------------------------- 10:30 PM on 08/03/2015 -----------------------------------------  Wife is now the bedside and says that the patient has been very active today, more than usual. Says that he also had a similar visit the hospital in March. She said his blood pressure dropped at that time. The patient is now remembering more of the events. He says that after dinner he began to feel lightheaded. He rolled himself down a wheelchair. He has been using wheelchair for the past 3-4 days secondary to her groin strain. He says that he almost frontal following his bedroom when he passed out. He denies any chest pain or shortness of breath. The wife said that he was in and out of consciousness for about 10 minutes. He then vomited.  ----------------------------------------- 11:31 PM on 08/03/2015 -----------------------------------------  Discussed case with Dr. Satira Mccallum who does not recommend any medication changes at this time. He does agree that the episode sounds  vagal that  the patient should stay well-hydrated and follow-up in office. Patient had pacer interrogated as well which did not report any abnormalities.  ----------------------------------------- 12:42 AM on 08/04/2015 -----------------------------------------  Signed out to Dr. Beather Arbour.   ____________________________________________   FINAL CLINICAL IMPRESSION(S) / ED DIAGNOSES  Syncope.    Orbie Pyo, MD 08/04/15 [phone]  Dr. Satira Mccallum did not recommend any medication  changes.  Orbie Pyo, MD 08/04/15 [phone]

## 2015-08-03 NOTE — ED Notes (Signed)
Pt presents to ED via [facility] from home. EMS were called to home because pt was unresponsive to family. Pt called family because he did not feel good after eating supper. Pt vomited chunky vomit with EMS, EMS gave 4mg  Zofran. Pt had medtronic pacemaker placed March 4th at [facility], paced rhythm low 60's HR with EMS. BP low with EMS, 70's/40's, last BP 98/50. EMS states that when they started moving the pt he began grunting. When they placed the pt in the ambulance, the pt began talking to EMS and interacting with them. Upon arrival pt answer MD [place] questions, alert and oriented to name, DOB, year. Pt denies weakness, pain. Pt denies remembering events from tonight.

## 2015-08-04 DIAGNOSIS — R55 Syncope and collapse: Secondary | ICD-10-CM | POA: Diagnosis not present

## 2015-08-04 LAB — TROPONIN I: Troponin I: <0.03 ng/mL (ref <0.031)

## 2015-08-04 NOTE — ED Provider Notes (Signed)
-----------------------------------------   2:13 AM on 08/04/2015 -----------------------------------------  Repeat troponin remains negative. Will proceed with discharge per Dr. Dineen Kid for close follow-up with [facility] cardiology. Patient feels fine and is eager for discharge. Strict return precautions given. Patient verbalizes understanding and agrees with plan of care.  Paulette Blanch, MD 08/04/15 [phone]

## 2015-08-04 NOTE — ED Notes (Signed)
Dr. Sung at bedside.  

## 2015-08-04 NOTE — Discharge Instructions (Signed)

## 2015-08-18 ENCOUNTER — Inpatient Hospital Stay: Payer: Medicare Other

## 2015-08-18 ENCOUNTER — Inpatient Hospital Stay: Payer: Medicare Other | Attending: Oncology | Admitting: Oncology

## 2015-08-18 ENCOUNTER — Encounter (INDEPENDENT_AMBULATORY_CARE_PROVIDER_SITE_OTHER): Payer: Self-pay

## 2015-08-18 VITALS — BP 180/83 | HR 60 | Temp 97.2°F | Resp 18 | Wt 189.4 lb

## 2015-08-18 DIAGNOSIS — Z85821 Personal history of Merkel cell carcinoma: Secondary | ICD-10-CM | POA: Diagnosis not present

## 2015-08-18 DIAGNOSIS — Z8719 Personal history of other diseases of the digestive system: Secondary | ICD-10-CM | POA: Diagnosis not present

## 2015-08-18 DIAGNOSIS — I10 Essential (primary) hypertension: Secondary | ICD-10-CM | POA: Diagnosis not present

## 2015-08-18 DIAGNOSIS — E871 Hypo-osmolality and hyponatremia: Secondary | ICD-10-CM

## 2015-08-18 DIAGNOSIS — D649 Anemia, unspecified: Secondary | ICD-10-CM | POA: Diagnosis not present

## 2015-08-18 DIAGNOSIS — Z950 Presence of cardiac pacemaker: Secondary | ICD-10-CM | POA: Insufficient documentation

## 2015-08-18 DIAGNOSIS — Z87891 Personal history of nicotine dependence: Secondary | ICD-10-CM

## 2015-08-18 DIAGNOSIS — Z7982 Long term (current) use of aspirin: Secondary | ICD-10-CM | POA: Insufficient documentation

## 2015-08-18 DIAGNOSIS — Z79899 Other long term (current) drug therapy: Secondary | ICD-10-CM | POA: Insufficient documentation

## 2015-08-18 DIAGNOSIS — R600 Localized edema: Secondary | ICD-10-CM

## 2015-08-18 DIAGNOSIS — C4A9 Merkel cell carcinoma, unspecified: Secondary | ICD-10-CM

## 2015-08-18 DIAGNOSIS — Z8673 Personal history of transient ischemic attack (TIA), and cerebral infarction without residual deficits: Secondary | ICD-10-CM | POA: Diagnosis not present

## 2015-08-18 DIAGNOSIS — K769 Liver disease, unspecified: Secondary | ICD-10-CM | POA: Diagnosis not present

## 2015-08-18 LAB — CBC WITH DIFFERENTIAL/PLATELET
Basophils Absolute: 0.1 10*3/uL (ref 0–0.1)
Basophils Relative: 1 %
EOS ABS: 0.1 10*3/uL (ref 0–0.7)
Eosinophils Relative: 2 %
HEMATOCRIT: 31.6 % — AB (ref 40.0–52.0)
HEMOGLOBIN: 10.6 g/dL — AB (ref 13.0–18.0)
LYMPHS ABS: 1.6 10*3/uL (ref 1.0–3.6)
Lymphocytes Relative: 19 %
MCH: 28.5 pg (ref 26.0–34.0)
MCHC: 33.6 g/dL (ref 32.0–36.0)
MCV: 84.8 fL (ref 80.0–100.0)
MONO ABS: 0.8 10*3/uL (ref 0.2–1.0)
MONOS PCT: 10 %
NEUTROS ABS: 5.5 10*3/uL (ref 1.4–6.5)
NEUTROS PCT: 68 %
Platelets: 338 10*3/uL (ref 150–440)
RBC: 3.73 MIL/uL — ABNORMAL LOW (ref 4.40–5.90)
RDW: 23.1 % — AB (ref 11.5–14.5)
WBC: 8.1 10*3/uL (ref 3.8–10.6)

## 2015-08-18 LAB — COMPREHENSIVE METABOLIC PANEL
ALBUMIN: 4 g/dL (ref 3.5–5.0)
ALK PHOS: 161 U/L — AB (ref 38–126)
ALT: 23 U/L (ref 17–63)
AST: 26 U/L (ref 15–41)
Anion gap: 4 — ABNORMAL LOW (ref 5–15)
BUN: 19 mg/dL (ref 6–20)
CHLORIDE: 100 mmol/L — AB (ref 101–111)
CO2: 30 mmol/L (ref 22–32)
CREATININE: 0.63 mg/dL (ref 0.61–1.24)
Calcium: 8.8 mg/dL — ABNORMAL LOW (ref 8.9–10.3)
GFR calc Af Amer: >60 mL/min (ref >60)
GFR calc non Af Amer: >60 mL/min (ref >60)
GLUCOSE: 111 mg/dL — AB (ref 65–99)
Potassium: 4.4 mmol/L (ref 3.5–5.1)
SODIUM: 134 mmol/L — AB (ref 135–145)
Total Bilirubin: 0.5 mg/dL (ref 0.3–1.2)
Total Protein: 6.9 g/dL (ref 6.5–8.1)

## 2015-08-18 LAB — IRON AND TIBC
Iron: 61 ug/dL (ref 45–182)
Saturation Ratios: 20 % (ref 17.9–39.5)
TIBC: 312 ug/dL (ref 250–450)
UIBC: 251 ug/dL

## 2015-08-18 LAB — FERRITIN: Ferritin: 52 ng/mL (ref 24–336)

## 2015-08-18 NOTE — Progress Notes (Signed)
Patient recently had a pacemaker placed.

## 2015-08-19 LAB — AFP TUMOR MARKER: AFP TUMOR MARKER: 1.8 ng/mL (ref 0.0–8.3)

## 2015-08-19 NOTE — Progress Notes (Signed)
[place]  Telephone:([phone] [phone]  ID: Anselm Lis. OB: 06-21-1945  MR#: [id number]  [id number]  Patient Care Team: [name], MD as PCP - General (Family Medicine) Seeplaputhur Robinette Haines, MD (General Surgery)  CHIEF COMPLAINT:  Chief Complaint  Patient presents with  . Markel cell carcinoma  . Results    f/u MRI for liver lesions    INTERVAL HISTORY: Patient returns to clinic today for routine evaluation. He recently had a pacemaker placed, but otherwise feels well. He has had no recent fevers or illnesses.  He has no neurologic complaints. He has a good appetite and denies weight loss.  He denies any chest pain or shortness of breath.  He denies any nausea, vomiting, constipation, or diarrhea.  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  He offers no further specific complaints today.   REVIEW OF SYSTEMS:   Review of Systems  Constitutional: Negative for fever, weight loss and malaise/fatigue.  Respiratory: Negative.  Negative for shortness of breath.   Cardiovascular: Negative.  Negative for chest pain.  Gastrointestinal: Negative.  Negative for nausea, vomiting, abdominal pain, diarrhea, constipation, blood in stool and melena.  Musculoskeletal: Positive for back pain.  Neurological: Negative.  Negative for weakness.  Psychiatric/Behavioral: The patient is not nervous/anxious.     As per HPI. Otherwise, a complete review of systems is negatve.  PAST MEDICAL HISTORY: Past Medical History  Diagnosis Date  . Hypertension   . Glaucoma   . Cancer (HCC)     merkel cell cancer  . Cancer [facility])     prostate  . TIA (transient ischemic attack)   . Bipolar 1 disorder ([place])   . Presence of permanent cardiac pacemaker   . Mural thrombus of cardiac apex (HCC)   . 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 Prostate surgery    . Cholecystectomy    . Prostatectomy    . Leg surgery Left     distal  . Skin cancer excision  [id number]    Merkle Cell Carcinoma  . Pace maker placement      FAMILY HISTORY: Heart disease and bipolar disease.    ADVANCED DIRECTIVES:    HEALTH MAINTENANCE: Social History  Substance Use Topics  . Smoking status: Former Research scientist (life sciences)  . Smokeless tobacco: Not on file  . Alcohol Use: No     Colonoscopy:  PAP:  Bone density:  Lipid panel:  Allergies  Allergen Reactions  . Other Hives, Shortness Of Breath and Other (See Comments)    Pt states that he is allergic to unwashed blood products.    . Feldene [Piroxicam] Hives  . Sulfa Antibiotics Hives    Current Outpatient Prescriptions  Medication Sig Dispense Refill  . acetaminophen (TYLENOL) 325 MG tablet Take 2 tablets (650 mg total) by mouth every 6 (six) hours as needed for mild pain or moderate pain.    . ARIPiprazole (ABILIFY) 5 MG tablet Take 5 mg by mouth 2 (two) times daily.     Marland Kitchen atorvastatin (LIPITOR) 20 MG tablet Take 1 tablet (20 mg total) by mouth daily at 6 PM.    . Calcium 250 MG CAPS Take 500 mg by mouth daily.    . carbamazepine (TEGRETOL) 200 MG tablet Take 600 mg by mouth 2 (two) times daily.    . dabigatran (PRADAXA)  150 MG CAPS capsule Take 150 mg by mouth 2 (two) times daily.    Marland Kitchen esomeprazole (NEXIUM) 20 MG capsule Take 20 mg by mouth 2 (two) times daily before a meal.    . ferrous sulfate 325 (65 FE) MG tablet Take 650 mg by mouth 3 (three) times daily with meals.     . gabapentin (NEURONTIN) 300 MG capsule Take 300 mg by mouth 2 (two) times daily.    Marland Kitchen glucosamine-chondroitin 500-400 MG tablet Take 1 tablet by mouth daily.    Marland Kitchen latanoprost (XALATAN) 0.005 % ophthalmic solution Place 1 drop into both eyes at bedtime.    Marland Kitchen lisinopril (PRINIVIL,ZESTRIL) 40 MG tablet Take 40 mg by mouth 2 (two) times daily.     . metoprolol succinate (TOPROL-XL) 50 MG 24 hr  tablet Take 50 mg by mouth 2 (two) times daily. Take with or immediately following a meal.    . Multiple Vitamin (MULTIVITAMIN WITH MINERALS) TABS tablet Take 1 tablet by mouth daily.    . Omega-3 Fatty Acids (FISH OIL) 1000 MG CAPS Take 1,000 mg by mouth 2 (two) times daily.     . QUEtiapine (SEROQUEL) 300 MG tablet Take 600 mg by mouth at bedtime.    . temazepam (RESTORIL) 15 MG capsule Take 15 mg by mouth at bedtime as needed for sleep.    Marland Kitchen timolol (BETIMOL) 0.5 % ophthalmic solution Place 1 drop into the right eye daily.    Marland Kitchen aspirin EC 81 MG tablet Take 81 mg by mouth daily. Reported on 08/18/2015     No current facility-administered medications for this visit.    OBJECTIVE: Filed Vitals:   08/18/15 1452  BP: 180/83  Pulse: 60  Temp: 97.2 F (36.2 C)  Resp: 18     Body mass index is 25.68 kg/(m^2).    ECOG FS:0 - Asymptomatic  General: Well-developed, well-nourished, no acute distress. Eyes: Pink conjunctiva, anicteric sclera. Lungs: Clear to auscultation bilaterally. Heart: Regular rate and rhythm. No rubs, murmurs, or gallops. Abdomen: Soft, nontender, nondistended. No organomegaly noted, normoactive bowel sounds. Musculoskeletal: No edema, cyanosis, or clubbing. Neuro: Alert, answering all questions appropriately. Cranial nerves grossly intact. Skin: No rashes or petechiae noted. Psych: Normal affect.   LAB RESULTS:  Lab Results  Component Value Date   NA 134* 08/18/2015   K 4.4 08/18/2015   CL 100* 08/18/2015   CO2 30 08/18/2015   GLUCOSE 111* 08/18/2015   BUN 19 08/18/2015   CREATININE 0.63 08/18/2015   CALCIUM 8.8* 08/18/2015   PROT 6.9 08/18/2015   ALBUMIN 4.0 08/18/2015   AST 26 08/18/2015   ALT 23 08/18/2015   ALKPHOS 161* 08/18/2015   BILITOT 0.5 08/18/2015   GFRNONAA >60 08/18/2015   GFRAA >60 08/18/2015    Lab Results  Component Value Date   WBC 8.1 08/18/2015   NEUTROABS 5.5 08/18/2015   HGB 10.6* 08/18/2015   HCT 31.6* 08/18/2015   MCV  84.8 08/18/2015   PLT 338 08/18/2015     STUDIES: Dg Chest 1 View  08/03/2015  CLINICAL DATA:  Productive cough for several weeks. Initial encounter. EXAM: CHEST 1 VIEW COMPARISON:  Single view of the chest 07/19/2015 and 07/07/2015. FINDINGS: The heart size and mediastinal contours are within normal limits. Both lungs are clear. Pacing device is noted, The visualized skeletal structures are unremarkable. IMPRESSION: No active disease. Electronically Signed   By: Inge Rise M.D.   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 Adjuvant chemoradiation therapy completed in July of 2007.  PLAN:    1. Merkel cell:  PET scan from November 2014 was negative for any recurrence, no further imaging is necessary.  The remainder of his laboratory work is also within normal limits.  No intervention is needed at this time.  Although patient is over 10 years out from his adjuvant treatment, he has requested extended follow-up therefore will return to clinic in 1 year for further evaluation. No further imaging is necessary. Recent MRI of the abdomen revealed benign liver lesions.  2. Hypertension: Blood pressure is elevated today, continue current medications as prescribed.   3. Anemia: Patient has a history of GI bleed. Hemoglobin is decreased, but essentially unchanged. Monitor.  4. Hyponatremia: Unclear etiology, monitor.  Patient expressed understanding and was in agreement with this plan. He also understands that He can call clinic at any time with any questions, concerns, or complaints.     Lloyd Huger, MD   08/19/2015 2:42 PM

## 2016-03-09 DIAGNOSIS — E669 Obesity, unspecified: Secondary | ICD-10-CM | POA: Insufficient documentation

## 2016-05-23 ENCOUNTER — Other Ambulatory Visit
Admission: RE | Admit: 2016-05-23 | Discharge: 2016-05-23 | Disposition: A | Discharge status: Home / Self Care | Payer: Medicare Other | Source: Ambulatory Visit | Attending: Unknown Physician Specialty | Admitting: Unknown Physician Specialty

## 2016-05-23 DIAGNOSIS — Z79899 Other long term (current) drug therapy: Secondary | ICD-10-CM | POA: Insufficient documentation

## 2016-05-23 DIAGNOSIS — F329 Major depressive disorder, single episode, unspecified: Secondary | ICD-10-CM | POA: Insufficient documentation

## 2016-05-23 LAB — HEPATIC FUNCTION PANEL
ALT: 26 U/L (ref 17–63)
AST: 27 U/L (ref 15–41)
Albumin: 4 g/dL (ref 3.5–5.0)
Alkaline Phosphatase: 82 U/L (ref 38–126)
Total Bilirubin: 0.8 mg/dL (ref 0.3–1.2)
Total Protein: 7.1 g/dL (ref 6.5–8.1)

## 2016-05-23 LAB — CBC
HCT: 37.3 % — ABNORMAL LOW (ref 40.0–52.0)
HEMOGLOBIN: 13.3 g/dL (ref 13.0–18.0)
MCH: 33.6 pg (ref 26.0–34.0)
MCHC: 35.6 g/dL (ref 32.0–36.0)
MCV: 94.2 fL (ref 80.0–100.0)
Platelets: 238 10*3/uL (ref 150–440)
RBC: 3.96 MIL/uL — AB (ref 4.40–5.90)
RDW: 13 % (ref 11.5–14.5)
WBC: 5.5 10*3/uL (ref 3.8–10.6)

## 2016-06-12 ENCOUNTER — Encounter: Payer: Self-pay | Admitting: Emergency Medicine

## 2016-06-12 ENCOUNTER — Emergency Department
Admission: EM | Admit: 2016-06-12 | Discharge: 2016-06-12 | Disposition: A | Discharge status: Home / Self Care | Payer: Medicare Other | Attending: Emergency Medicine | Admitting: Emergency Medicine

## 2016-06-12 ENCOUNTER — Emergency Department: Payer: Medicare Other

## 2016-06-12 DIAGNOSIS — Z87891 Personal history of nicotine dependence: Secondary | ICD-10-CM | POA: Diagnosis not present

## 2016-06-12 DIAGNOSIS — R5383 Other fatigue: Secondary | ICD-10-CM | POA: Insufficient documentation

## 2016-06-12 DIAGNOSIS — I10 Essential (primary) hypertension: Secondary | ICD-10-CM | POA: Diagnosis not present

## 2016-06-12 DIAGNOSIS — Z7982 Long term (current) use of aspirin: Secondary | ICD-10-CM | POA: Diagnosis not present

## 2016-06-12 DIAGNOSIS — Z79899 Other long term (current) drug therapy: Secondary | ICD-10-CM | POA: Diagnosis not present

## 2016-06-12 DIAGNOSIS — Z8546 Personal history of malignant neoplasm of prostate: Secondary | ICD-10-CM | POA: Insufficient documentation

## 2016-06-12 DIAGNOSIS — R0602 Shortness of breath: Secondary | ICD-10-CM | POA: Insufficient documentation

## 2016-06-12 LAB — CBC
HCT: 39.8 % — ABNORMAL LOW (ref 40.0–52.0)
Hemoglobin: 13.8 g/dL (ref 13.0–18.0)
MCH: 32.6 pg (ref 26.0–34.0)
MCHC: 34.7 g/dL (ref 32.0–36.0)
MCV: 94.1 fL (ref 80.0–100.0)
Platelets: 264 10*3/uL (ref 150–440)
RBC: 4.23 MIL/uL — ABNORMAL LOW (ref 4.40–5.90)
RDW: 13.3 % (ref 11.5–14.5)
WBC: 7.2 10*3/uL (ref 3.8–10.6)

## 2016-06-12 LAB — TROPONIN I
Troponin I: <0.03 ng/mL (ref <0.03)
Troponin I: <0.03 ng/mL (ref <0.03)

## 2016-06-12 LAB — INFLUENZA PANEL BY PCR (TYPE A & B)
INFLAPCR: NEGATIVE
INFLBPCR: NEGATIVE

## 2016-06-12 LAB — COMPREHENSIVE METABOLIC PANEL
ALBUMIN: 4.1 g/dL (ref 3.5–5.0)
ALT: 32 U/L (ref 17–63)
AST: 26 U/L (ref 15–41)
Alkaline Phosphatase: 81 U/L (ref 38–126)
Anion gap: 8 (ref 5–15)
BUN: 18 mg/dL (ref 6–20)
CO2: 26 mmol/L (ref 22–32)
Calcium: 8.7 mg/dL — ABNORMAL LOW (ref 8.9–10.3)
Chloride: 103 mmol/L (ref 101–111)
Creatinine, Ser: 0.74 mg/dL (ref 0.61–1.24)
GFR calc non Af Amer: >60 mL/min (ref >60)
GLUCOSE: 103 mg/dL — AB (ref 65–99)
POTASSIUM: 4 mmol/L (ref 3.5–5.1)
SODIUM: 137 mmol/L (ref 135–145)
TOTAL PROTEIN: 7.4 g/dL (ref 6.5–8.1)
Total Bilirubin: 0.2 mg/dL — ABNORMAL LOW (ref 0.3–1.2)

## 2016-06-12 MED ORDER — LISINOPRIL 20 MG PO TABS
40.0000 mg | ORAL_TABLET | Freq: Once | ORAL | Status: AC
  Administered 2016-06-12: 40 mg via ORAL
  Filled 2016-06-12: qty 2

## 2016-06-12 MED ORDER — METOPROLOL TARTRATE 50 MG PO TABS
50.0000 mg | ORAL_TABLET | Freq: Once | ORAL | Status: AC
  Administered 2016-06-12: 50 mg via ORAL
  Filled 2016-06-12: qty 1

## 2016-06-12 MED ORDER — IOPAMIDOL (ISOVUE-370) INJECTION 76%
75.0000 mL | Freq: Once | INTRAVENOUS | Status: AC | PRN
  Administered 2016-06-12: 75 mL via INTRAVENOUS

## 2016-06-12 NOTE — ED Notes (Signed)
Patient transported to CT 

## 2016-06-12 NOTE — ED Notes (Signed)
Attempted IV x2 byt this RN and another Therapist, sports. Blood drawn from left hand.

## 2016-06-12 NOTE — ED Triage Notes (Signed)
Shortness of breath x 2 days, states gradually increasing, denies chest pain.

## 2016-06-12 NOTE — ED Provider Notes (Signed)
[facility] Emergency Department Provider Note  ____________________________________________  Time seen: Approximately 5:17 PM  I have reviewed the triage vital signs and the nursing notes.   HISTORY  Chief Complaint Shortness of Breath   HPI Douglas Perkins. is a 71 y.o. male with a history of HTN, sick sinus sp pacemaker, cardiac thrombus, merkel cell cancer on remission who presents for evaluation of shortness of breath. Patient reports shortness of breath that started yesterday. It is present both at rest and with exertion, unchanged with exertion. Has had generalized fatigue since yesterday as well. No orthopnea. He denies chest pain but does endorse tightness in his chest that is worse with deep inspiration since yesterday and mild in severity. Patient recently returned from a road trip to [place] a week ago. He has noted swelling on his bilateral lower extremities right greater than left. No personal or fh of blood clots. He went to his cardiologist's office today to try to schedule an appointment for these complaints and he was sent to the emergency room for evaluation. He denies cough, congestion, bodyaches, fever or chills.   Past Medical History:  Diagnosis Date  . Bipolar 1 disorder ([place])   . Cancer (HCC)    merkel cell cancer  . Cancer [facility])    prostate  . Glaucoma   . Hypertension   . 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 Presence of permanent cardiac pacemaker   . TIA (transient ischemic attack)     Patient Active Problem List   Diagnosis Date Noted  . Syncope 07/19/2015  . Sepsis ([place]) 07/07/2015  . Bipolar I disorder ([place]) 06/24/2015  . Bilateral pneumonia 06/23/2015  . 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 Bradycardia 02/08/2015  . 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 AF (amaurosis fugax) 09/16/2014  . Benign essential HTN 09/16/2014  . Chest pain 09/16/2014  . Skin cyst  08/06/2012  . Personal history of lymphoma 08/06/2012  . Cancer [facility])     Past Surgical History:  Procedure Laterality Date  . CHOLECYSTECTOMY    . LEG SURGERY Left    distal  . Pace maker placement    . PROSTATE SURGERY    . PROSTATECTOMY    . SKIN CANCER EXCISION  2006,2007   Merkle Cell Carcinoma    Prior to Admission medications   Medication Sig Start Date End Date Taking? Authorizing Provider  acetaminophen (TYLENOL) 325 MG tablet Take 2 tablets (650 mg total) by mouth every 6 (six) hours as needed for mild pain or moderate pain. 07/12/15   Nicholes Mango, MD  ARIPiprazole (ABILIFY) 5 MG tablet Take 5 mg by mouth 2 (two) times daily.     Historical Provider, MD  aspirin EC 81 MG tablet Take 81 mg by mouth daily. Reported on 08/18/2015    Historical Provider, MD  atorvastatin (LIPITOR) 20 MG tablet Take 1 tablet (20 mg total) by mouth daily at 6 PM. 06/27/15   Henreitta Leber, MD  Calcium 250 MG CAPS Take 500 mg by mouth daily.    Historical Provider, MD  carbamazepine (TEGRETOL) 200 MG tablet Take 600 mg by mouth 2 (two) times daily.    Historical Provider, MD  dabigatran (PRADAXA) 150 MG CAPS capsule Take 150 mg by mouth 2 (two) times daily.    Historical Provider, MD  esomeprazole (NEXIUM) 20 MG capsule Take 20 mg by mouth 2 (two) times daily before a  meal.    Historical Provider, MD  ferrous sulfate 325 (65 FE) MG tablet Take 650 mg by mouth 3 (three) times daily with meals.     Historical Provider, MD  gabapentin (NEURONTIN) 300 MG capsule Take 300 mg by mouth 2 (two) times daily.    Historical Provider, MD  glucosamine-chondroitin 500-400 MG tablet Take 1 tablet by mouth daily.    Historical Provider, MD  latanoprost (XALATAN) 0.005 % ophthalmic solution Place 1 drop into both eyes at bedtime.    Historical Provider, MD  lisinopril (PRINIVIL,ZESTRIL) 40 MG tablet Take 40 mg by mouth 2 (two) times daily.     Historical Provider, MD  metoprolol succinate (TOPROL-XL) 50 MG 24 hr  tablet Take 50 mg by mouth 2 (two) times daily. Take with or immediately following a meal.    Historical Provider, MD  Multiple Vitamin (MULTIVITAMIN WITH MINERALS) TABS tablet Take 1 tablet by mouth daily.    Historical Provider, MD  Omega-3 Fatty Acids (FISH OIL) 1000 MG CAPS Take 1,000 mg by mouth 2 (two) times daily.     Historical Provider, MD  QUEtiapine (SEROQUEL) 300 MG tablet Take 600 mg by mouth at bedtime.    Historical Provider, MD  temazepam (RESTORIL) 15 MG capsule Take 15 mg by mouth at bedtime as needed for sleep.    Historical Provider, MD  timolol (BETIMOL) 0.5 % ophthalmic solution Place 1 drop into the right eye daily.    Historical Provider, MD    Allergies Other; Feldene [piroxicam]; and Sulfa antibiotics  Family History  Problem Relation Age of Onset  . Stroke Father   . CAD Paternal Grandmother   . CAD Paternal Grandfather     Social History Social History  Substance Use Topics  . Smoking status: Former Research scientist (life sciences)  . Smokeless tobacco: Not on file  . Alcohol use No    Review of Systems  Constitutional: Negative for fever. + fatigue Eyes: Negative for visual changes. ENT: Negative for sore throat. Neck: No neck pain  Cardiovascular: Negative for chest pain. Respiratory: + shortness of breath. Gastrointestinal: Negative for abdominal pain, vomiting or diarrhea. Genitourinary: Negative for dysuria. Musculoskeletal: Negative for back pain. Skin: Negative for rash. Neurological: Negative for headaches, weakness or numbness. Psych: No SI or HI  ____________________________________________   PHYSICAL EXAM:  VITAL SIGNS: ED Triage Vitals  Enc Vitals Group     BP 06/12/16 1509 (!) 196/81     Pulse Rate 06/12/16 1509 (!) 59     Resp 06/12/16 1509 20     Temp 06/12/16 1509 98.1 F (36.7 C)     Temp Source 06/12/16 1509 Oral     SpO2 06/12/16 1509 96 %     Weight 06/12/16 1516 241 lb (109.3 kg)     Height 06/12/16 1516 5\' 11"  (1.803 m)     Head  Circumference --      Peak Flow --      Pain Score 06/12/16 1516 2     Pain Loc --      Pain Edu? --      Excl. in [place]? --     Constitutional: Alert and oriented. Well appearing and in no apparent distress. HEENT:      Head: Normocephalic and atraumatic.         Eyes: Conjunctivae are normal. Sclera is non-icteric. EOMI. PERRL      Mouth/Throat: Mucous membranes are moist.       Neck: Supple with no signs of meningismus. Cardiovascular:  Regular rate and rhythm. No murmurs, gallops, or rubs. 2+ symmetrical distal pulses are present in all extremities. No JVD. Respiratory: Normal respiratory effort. Lungs are clear to auscultation bilaterally. No wheezes, crackles, or rhonchi.  Gastrointestinal: Soft, non tender, and non distended with positive bowel sounds. No rebound or guarding. Genitourinary: No CVA tenderness. Musculoskeletal: 1+ pitting edema on b/l LE Neurologic: Normal speech and language. Face is symmetric. Moving all extremities. No gross focal neurologic deficits are appreciated. Skin: Skin is warm, dry and intact. No rash noted. Psychiatric: Mood and affect are normal. Speech and behavior are normal.  ____________________________________________   LABS (all labs ordered are listed, but only abnormal results are displayed)  Labs Reviewed  CBC - Abnormal; Notable for the following:       Result Value   RBC 4.23 (*)    HCT 39.8 (*)    All other components within normal limits  COMPREHENSIVE METABOLIC PANEL - Abnormal; Notable for the following:    Glucose, Bld 103 (*)    Calcium 8.7 (*)    Total Bilirubin 0.2 (*)    All other components within normal limits  TROPONIN I  INFLUENZA PANEL BY PCR (TYPE A & B)  TROPONIN I   ____________________________________________  EKG  ED ECG REPORT I, Rudene Re, the attending physician, personally viewed and interpreted this ECG.  Atrial paced rhythm, rate of 60, right bundle branch block, normal QTc interval, left  axis deviation, no ST elevations or depressions. EKG is unchanged from prior. ____________________________________________  RADIOLOGY  CTA chest:  No acute changes ____________________________________________   PROCEDURES  Procedure(s) performed: None Procedures Critical Care performed:  None ____________________________________________   INITIAL IMPRESSION / ASSESSMENT AND PLAN / ED COURSE  71 y.o. male with a history of HTN, sick sinus sp pacemaker, HTN, cardiac thrombus, merkel cell cancer on remission who presents for evaluation of shortness of breath since yesterday. Also with b/l LE edema and fatigue. No flu like sx. Mild chest tightness. EKG with no ischemic changes. Lungs CTAB. Troponin is negative. CTA with no evidence of pericardial effusion, PE, PNA, PTX, or dissection. Will check for the Flu.  Clinical Course as of Jun 12 1941  Mon Jun 12, 2016  1941 CTA of the chest with no acute findings. Troponin 2 negative. EKG with no ischemic changes, influenza negative. Vital signs have remained stable. Unclear etiology of patient's shortness of breath. Possibly viral URI. We'll discharge home and close follow-up with primary care doctor.  [CV]    Clinical Course User Index [CV] Rudene Re, MD    Pertinent labs & imaging results that were available during my care of the patient were reviewed by me and considered in my medical decision making (see chart for details).    ____________________________________________   FINAL CLINICAL IMPRESSION(S) / ED DIAGNOSES  Final diagnoses:  SOB (shortness of breath)  Fatigue, unspecified type      NEW MEDICATIONS STARTED DURING THIS VISIT:  New Prescriptions   No medications on file     Note:  This document was prepared using Dragon voice recognition software and may include unintentional dictation errors.    Rudene Re, MD 06/12/16 1944

## 2016-06-14 ENCOUNTER — Ambulatory Visit: Payer: Medicare Other | Admitting: Urology

## 2016-06-16 DIAGNOSIS — R7989 Other specified abnormal findings of blood chemistry: Secondary | ICD-10-CM | POA: Insufficient documentation

## 2016-07-01 IMAGING — CT CT HEAD W/O CM
1 series · 16 of 30 positions shown, 20 images · non-contrast
Comparison: Prior study from 11/29/2014.

CLINICAL DATA: Initial evaluation for acute unresponsiveness.

EXAM:
CT HEAD WITHOUT CONTRAST
TECHNIQUE: Contiguous axial images were obtained from the base of the skull
through the vertex without intravenous contrast.

[Series 2: head wo · axial · 0.45mm/px · z∈[-186,-41]mm · 16 of 33 slices shown, 20 images]
[im 2/33  brain]
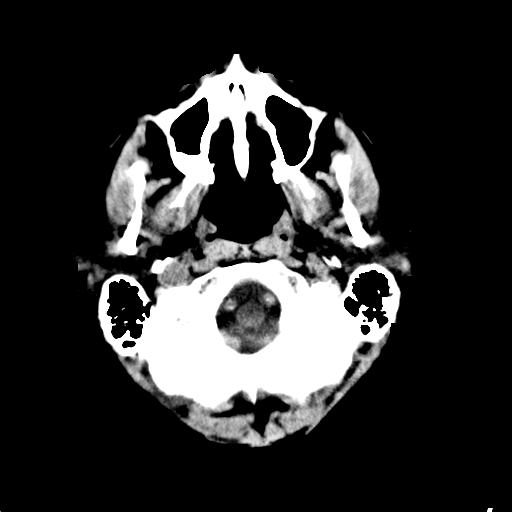
[im 2/33  bone]
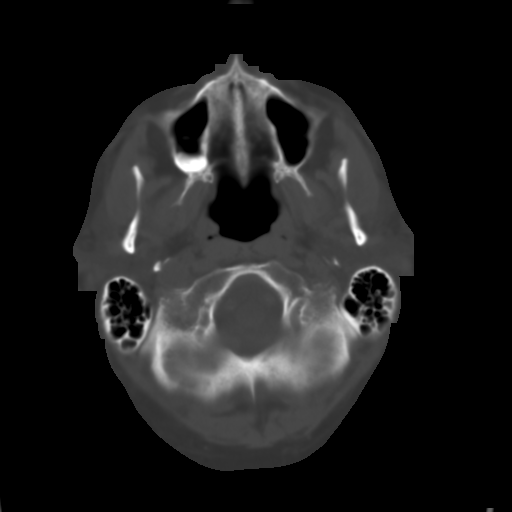
[im 4/33  brain]
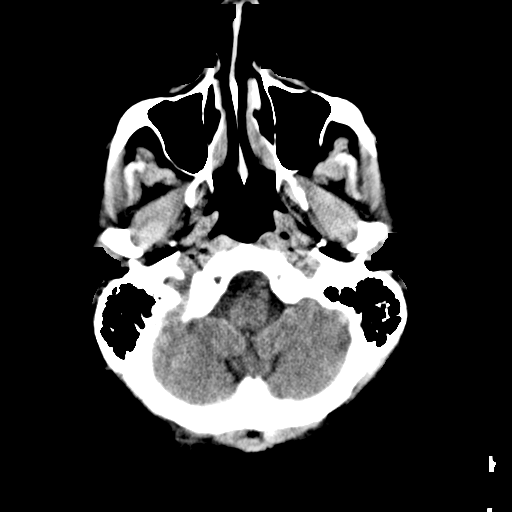
[im 6/33  brain]
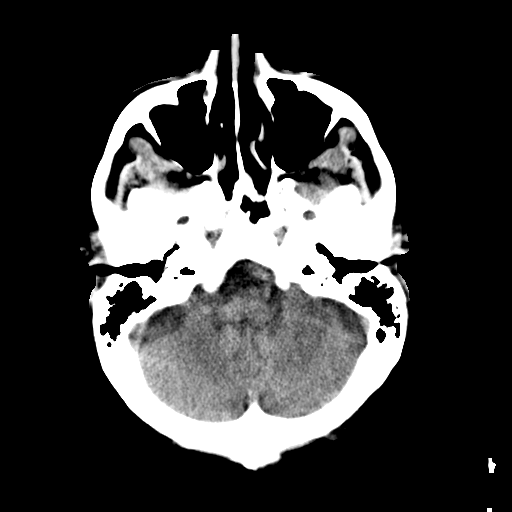
[im 8/33  brain]
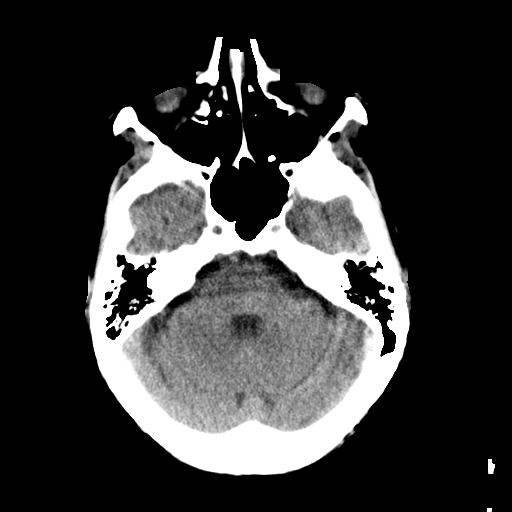
[im 9/33  brain]
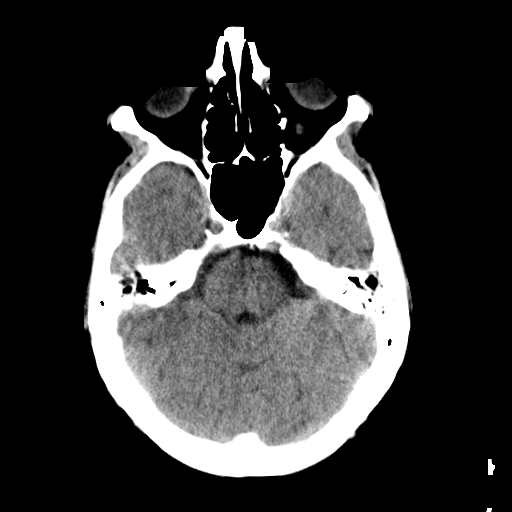
[im 9/33  bone]
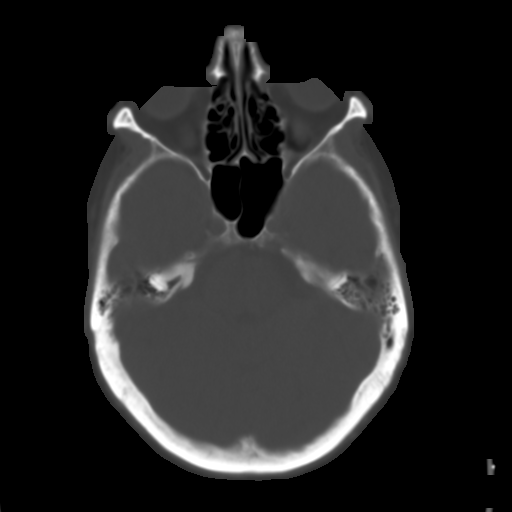
[im 12/33  brain]
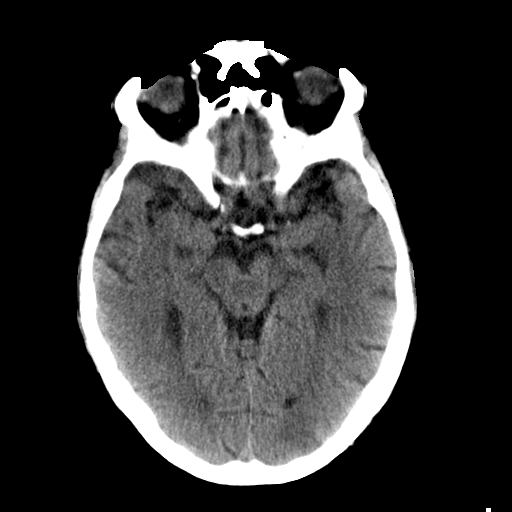
[im 14/33  brain]
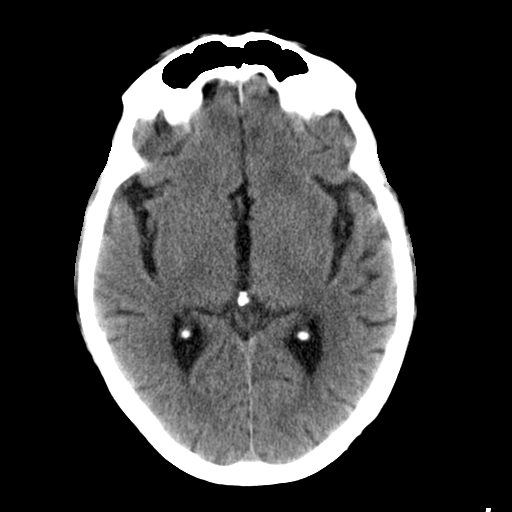
[im 16/33  brain]
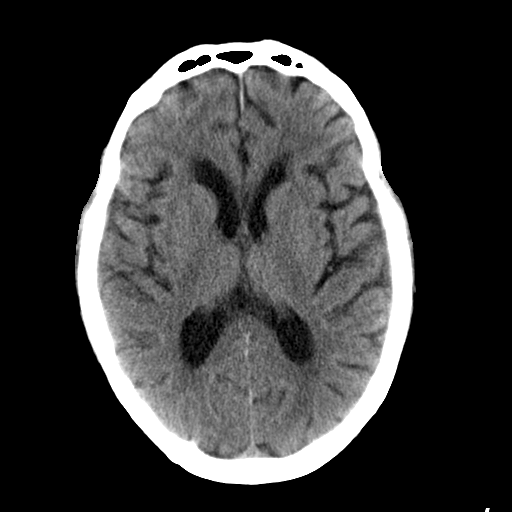
[im 17/33  brain]
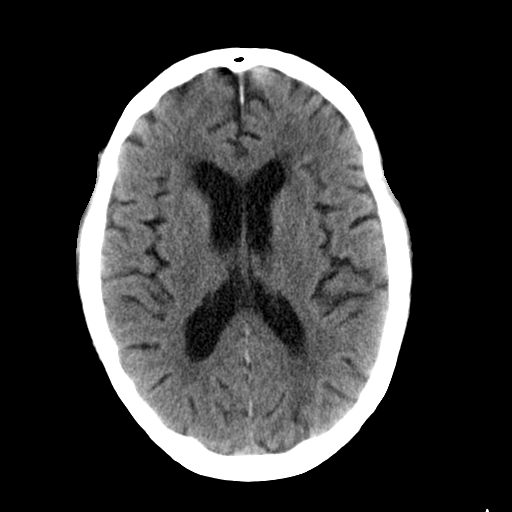
[im 17/33  bone]
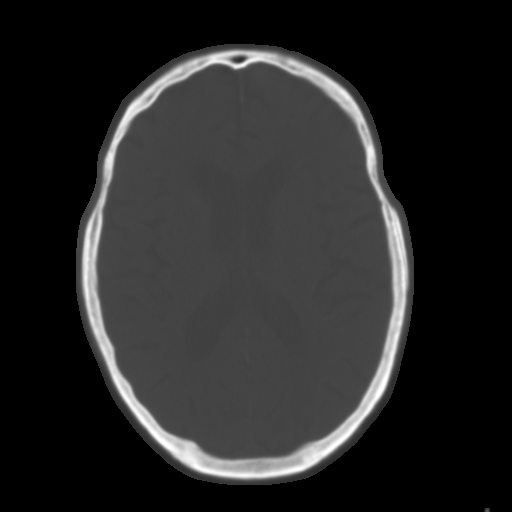
[im 19/33  brain]
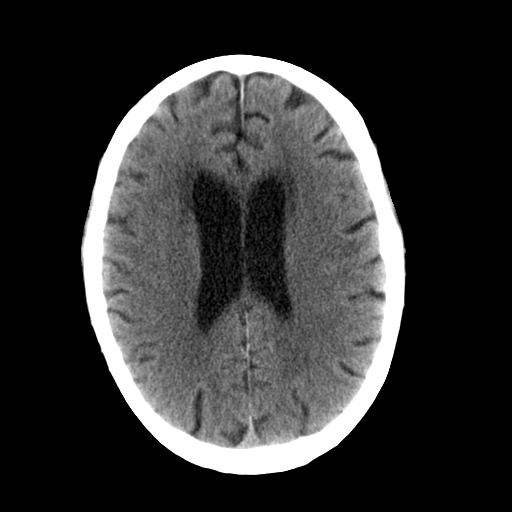
[im 21/33  brain]
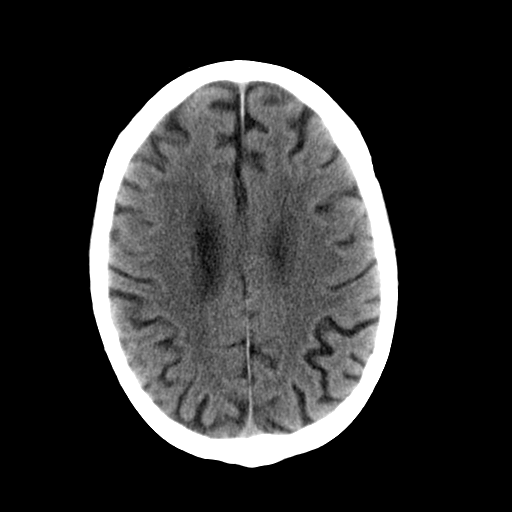
[im 24/33  brain]
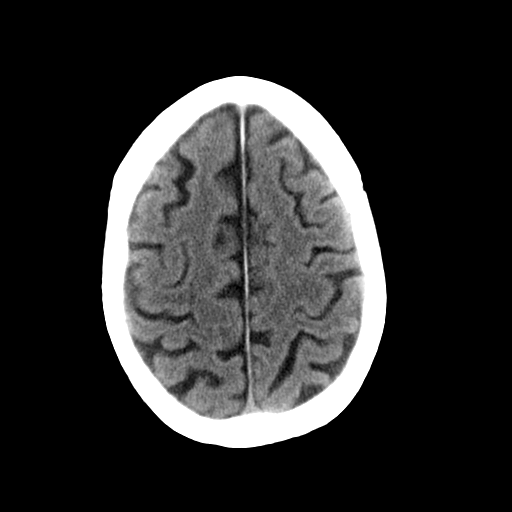
[im 25/33  brain]
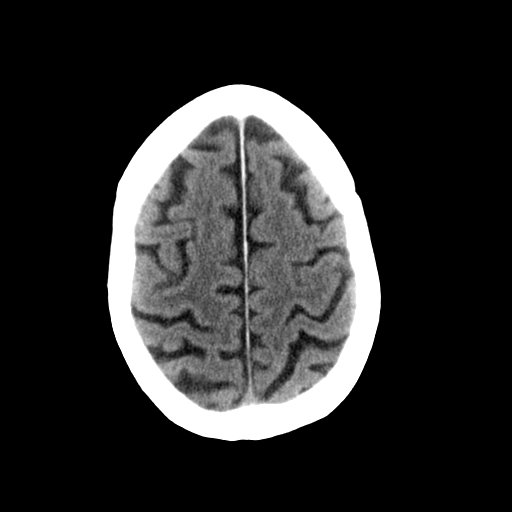
[im 25/33  bone]
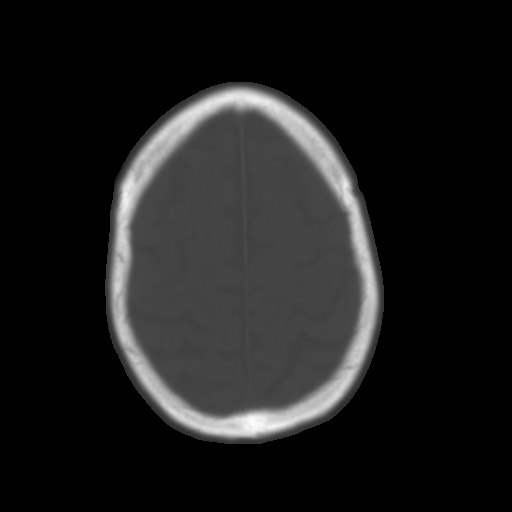
[im 27/33  brain]
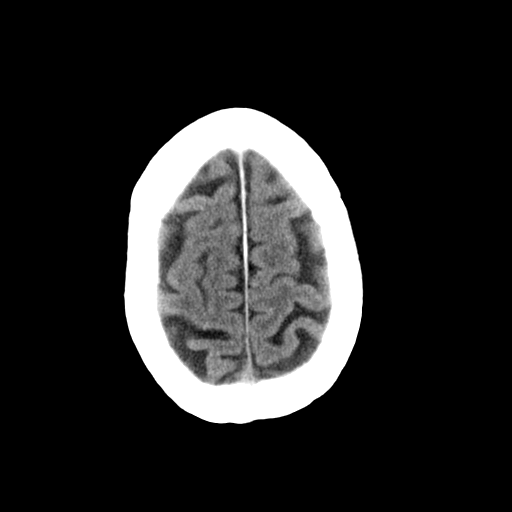
[im 29/33  brain]
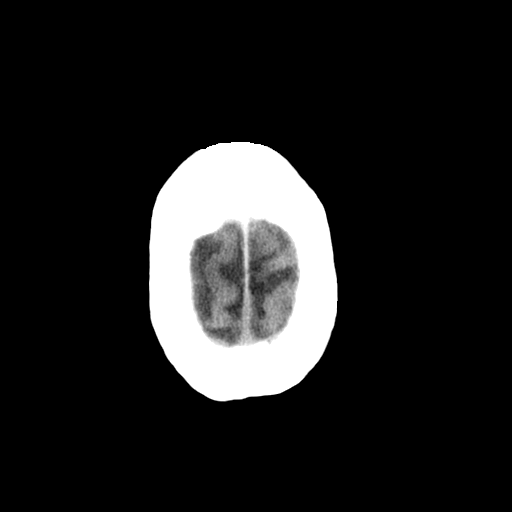
[im 31/33  brain]
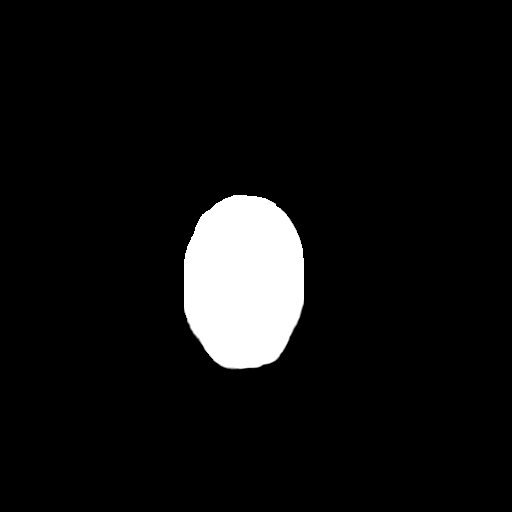

[16 of 30 positions shown; findings below may reference images not displayed]

FINDINGS: Diffuse prominence of the CSF containing spaces is compatible with
generalized age-related cerebral atrophy. Chronic small vessel
ischemic disease present within the periventricular and deep white
matter both cerebral hemispheres.

No acute intracranial hemorrhage or large vessel territory infarct.
No mass lesion, midline shift or mass effect. No hydrocephalus. No
extra-axial fluid collection para

Scalp soft tissues within normal limits. No acute abnormality about
the orbits. Sequela prior lens extraction on the right.

Visualized paranasal sinuses are clear.  No mastoid effusion.

Calvarium intact.
IMPRESSION: 1. No acute intracranial process.
2. Mild age-related cerebral atrophy with chronic small vessel
ischemic disease.

## 2016-07-01 IMAGING — CR DG CHEST 1V PORT
1 series · 1 of 1 positions shown · non-contrast
Comparison: 06/25/2015

CLINICAL DATA: Altered mental status.  Unresponsive.

EXAM:
PORTABLE CHEST 1 VIEW

[ap]
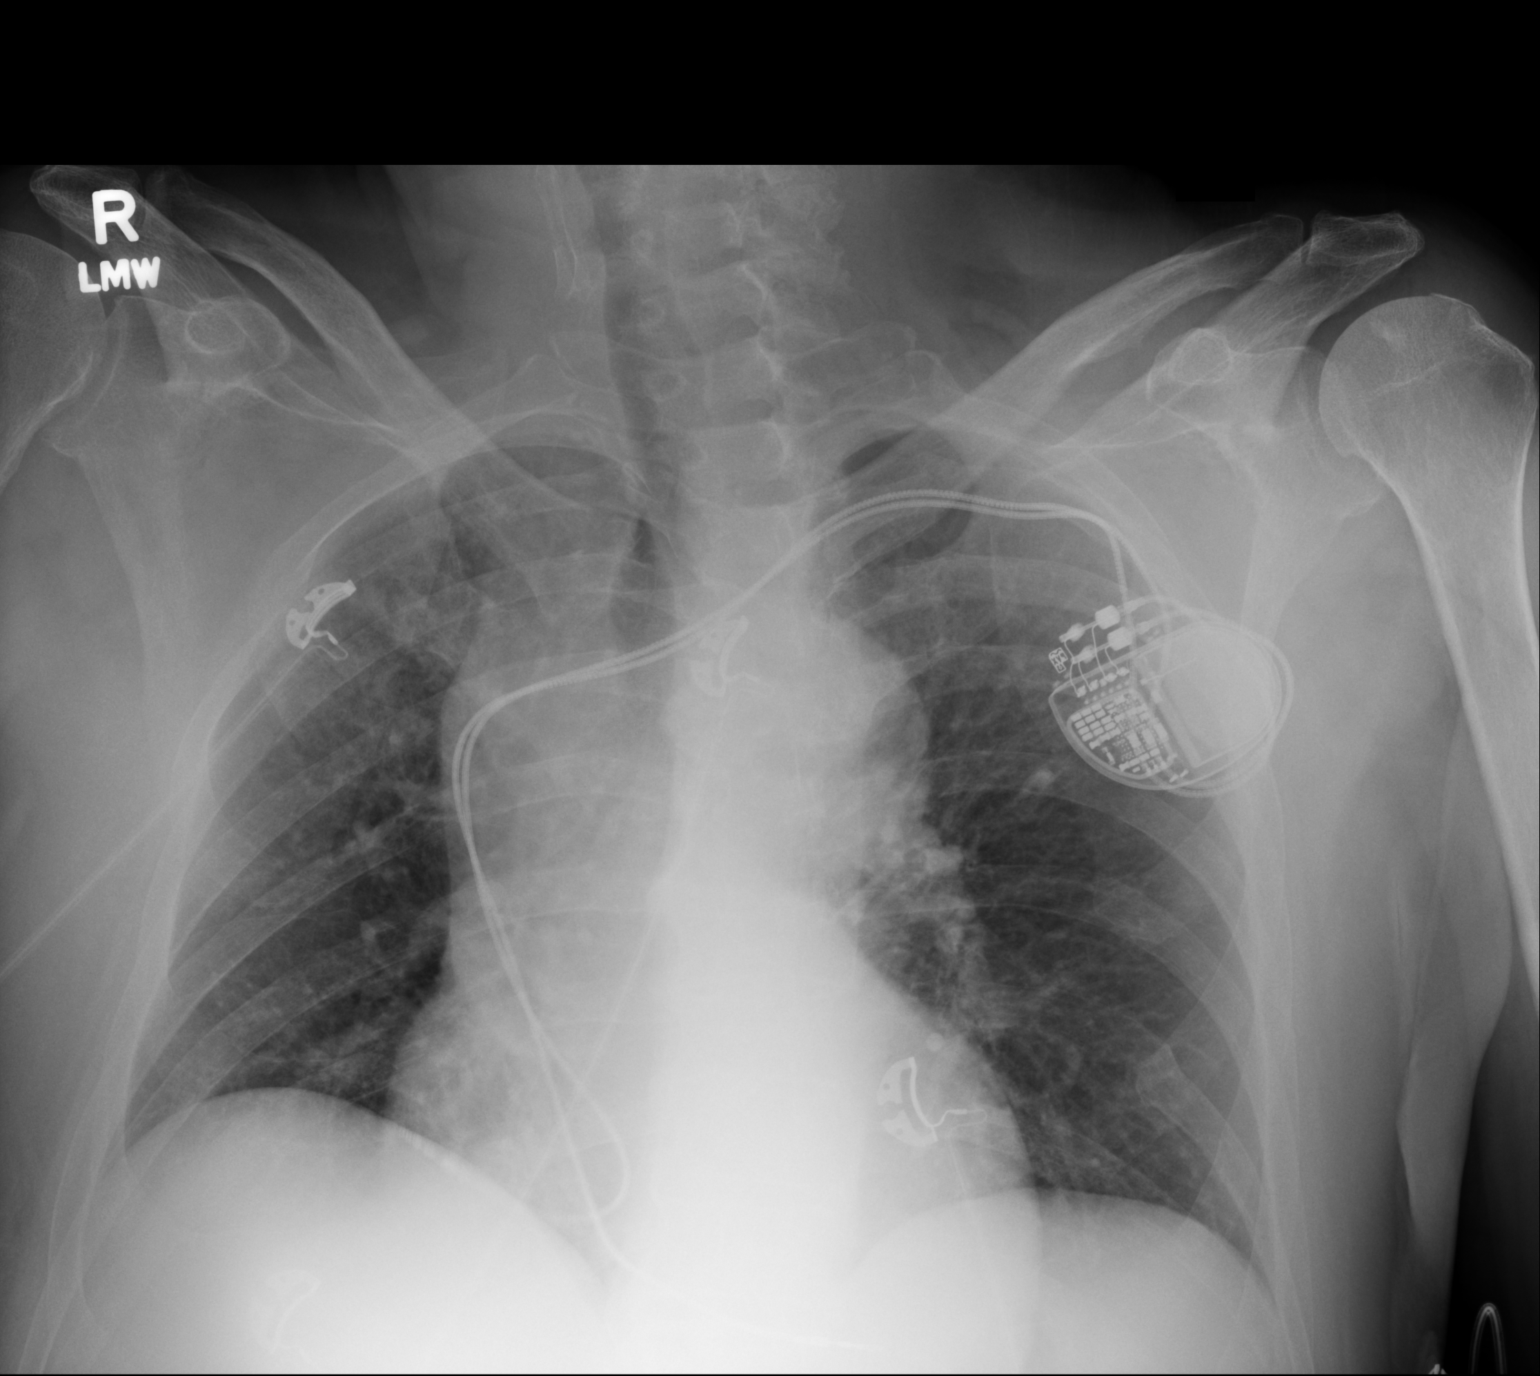

[1 of 1 positions shown; findings below may reference images not displayed]

FINDINGS: Shallow inspiration. Heart size and pulmonary vascularity are normal
for technique. Since the previous study, there has been interval
placement of a cardiac pacemaker. No pneumothorax. Interval
improvement of previous upper lung infiltrates. No focal
consolidation or airspace disease today. No blunting of costophrenic
angles. Tortuous aorta.
IMPRESSION: No active disease.

## 2016-07-28 IMAGING — DX DG CHEST 1V
1 series · 1 of 1 positions shown · non-contrast
Comparison: Single view of the chest 07/19/2015 and 07/07/2015.

CLINICAL DATA: Productive cough for several weeks. Initial
encounter.

EXAM:
CHEST 1 VIEW

[chest ap]
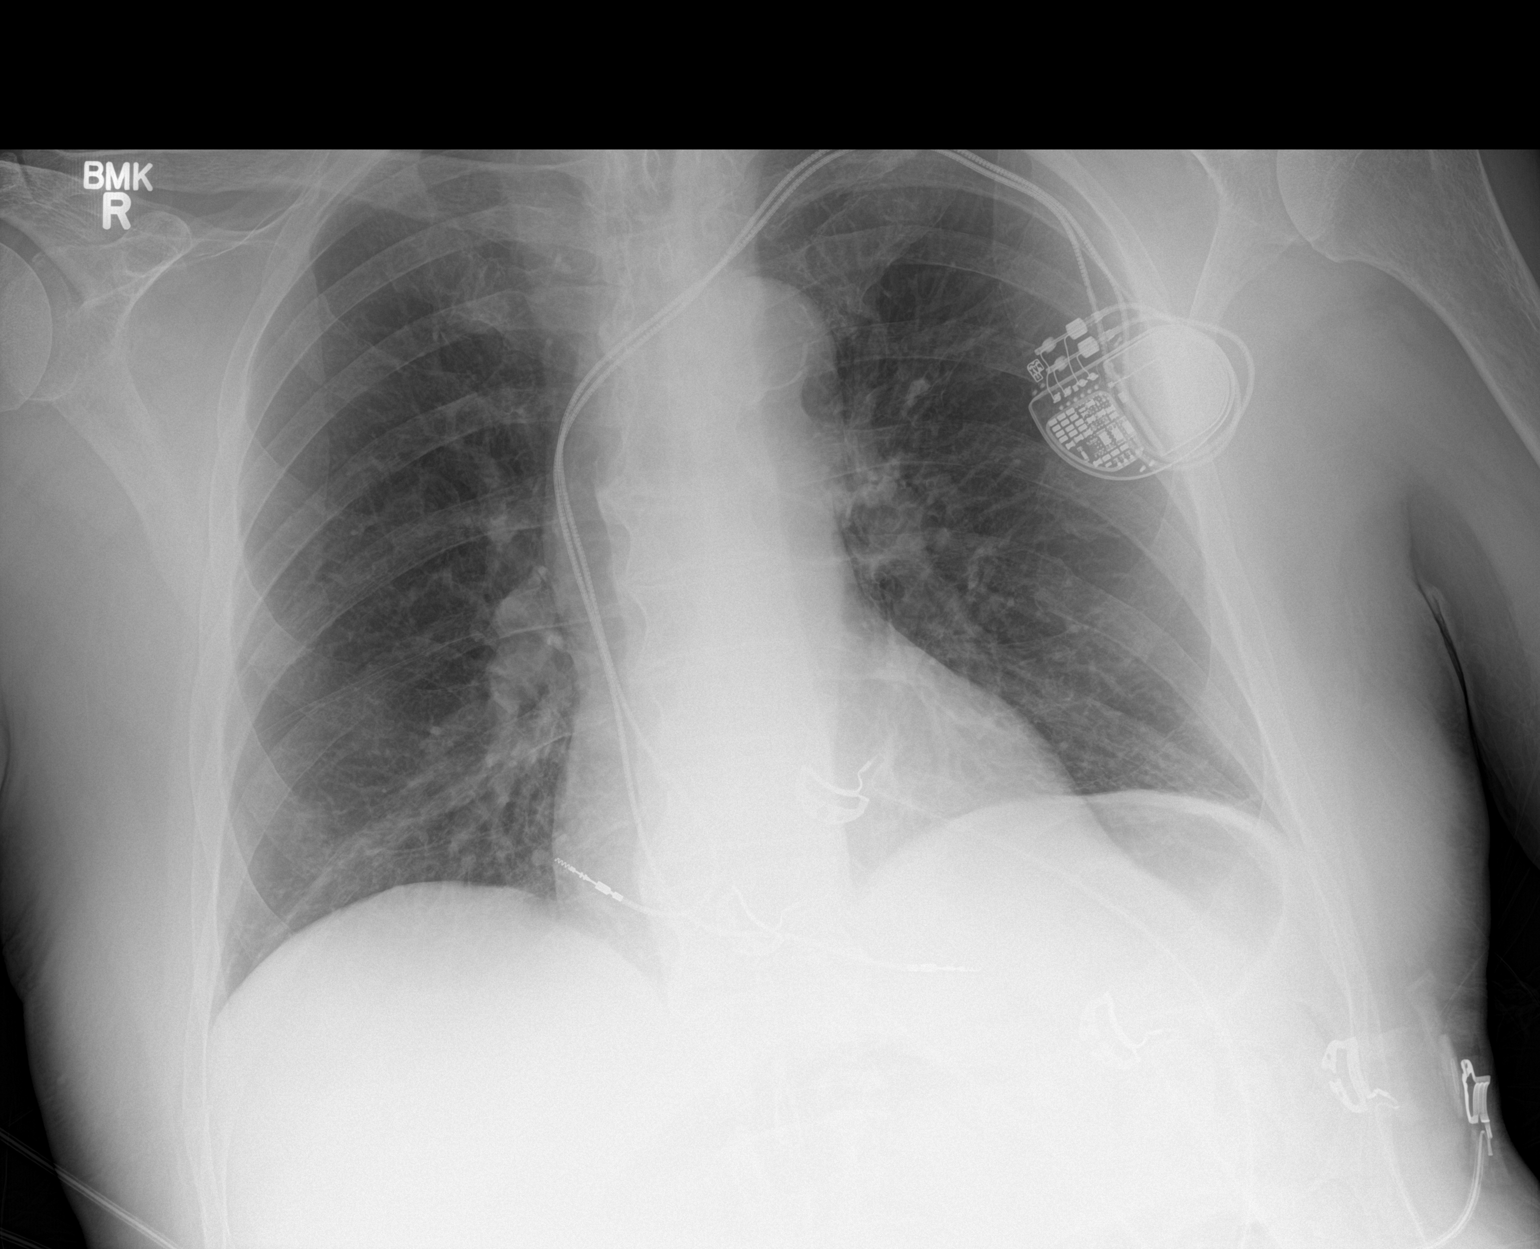

[1 of 1 positions shown; findings below may reference images not displayed]

FINDINGS: The heart size and mediastinal contours are within normal limits.
Both lungs are clear. Pacing device is noted, The visualized
skeletal structures are unremarkable.
IMPRESSION: No active disease.

## 2016-08-17 ENCOUNTER — Inpatient Hospital Stay (HOSPITAL_BASED_OUTPATIENT_CLINIC_OR_DEPARTMENT_OTHER): Payer: Medicare Other | Admitting: Oncology

## 2016-08-17 ENCOUNTER — Inpatient Hospital Stay: Payer: Medicare Other | Attending: Oncology

## 2016-08-17 ENCOUNTER — Other Ambulatory Visit: Payer: Self-pay

## 2016-08-17 VITALS — BP 207/94 | HR 60 | Temp 98.1°F | Resp 18 | Wt 248.7 lb

## 2016-08-17 DIAGNOSIS — R600 Localized edema: Secondary | ICD-10-CM

## 2016-08-17 DIAGNOSIS — R0609 Other forms of dyspnea: Secondary | ICD-10-CM | POA: Insufficient documentation

## 2016-08-17 DIAGNOSIS — I10 Essential (primary) hypertension: Secondary | ICD-10-CM | POA: Insufficient documentation

## 2016-08-17 DIAGNOSIS — Z950 Presence of cardiac pacemaker: Secondary | ICD-10-CM | POA: Insufficient documentation

## 2016-08-17 DIAGNOSIS — Z9221 Personal history of antineoplastic chemotherapy: Secondary | ICD-10-CM | POA: Diagnosis not present

## 2016-08-17 DIAGNOSIS — D649 Anemia, unspecified: Secondary | ICD-10-CM | POA: Diagnosis not present

## 2016-08-17 DIAGNOSIS — F319 Bipolar disorder, unspecified: Secondary | ICD-10-CM | POA: Diagnosis not present

## 2016-08-17 DIAGNOSIS — I110 Hypertensive heart disease with heart failure: Secondary | ICD-10-CM | POA: Diagnosis not present

## 2016-08-17 DIAGNOSIS — R531 Weakness: Secondary | ICD-10-CM | POA: Insufficient documentation

## 2016-08-17 DIAGNOSIS — C4A9 Merkel cell carcinoma, unspecified: Secondary | ICD-10-CM

## 2016-08-17 DIAGNOSIS — Z923 Personal history of irradiation: Secondary | ICD-10-CM | POA: Diagnosis not present

## 2016-08-17 DIAGNOSIS — Z7901 Long term (current) use of anticoagulants: Secondary | ICD-10-CM | POA: Diagnosis not present

## 2016-08-17 DIAGNOSIS — Z79899 Other long term (current) drug therapy: Secondary | ICD-10-CM

## 2016-08-17 DIAGNOSIS — R5383 Other fatigue: Secondary | ICD-10-CM

## 2016-08-17 DIAGNOSIS — Z8673 Personal history of transient ischemic attack (TIA), and cerebral infarction without residual deficits: Secondary | ICD-10-CM | POA: Insufficient documentation

## 2016-08-17 DIAGNOSIS — Z9079 Acquired absence of other genital organ(s): Secondary | ICD-10-CM | POA: Diagnosis not present

## 2016-08-17 DIAGNOSIS — Z87891 Personal history of nicotine dependence: Secondary | ICD-10-CM | POA: Diagnosis not present

## 2016-08-17 DIAGNOSIS — Z7982 Long term (current) use of aspirin: Secondary | ICD-10-CM

## 2016-08-17 DIAGNOSIS — R5381 Other malaise: Secondary | ICD-10-CM | POA: Insufficient documentation

## 2016-08-17 DIAGNOSIS — H409 Unspecified glaucoma: Secondary | ICD-10-CM | POA: Diagnosis not present

## 2016-08-17 DIAGNOSIS — R635 Abnormal weight gain: Secondary | ICD-10-CM | POA: Diagnosis not present

## 2016-08-17 DIAGNOSIS — Z85821 Personal history of Merkel cell carcinoma: Secondary | ICD-10-CM | POA: Insufficient documentation

## 2016-08-17 LAB — COMPREHENSIVE METABOLIC PANEL
ALK PHOS: 89 U/L (ref 38–126)
ALT: 29 U/L (ref 17–63)
ANION GAP: 6 (ref 5–15)
AST: 26 U/L (ref 15–41)
Albumin: 4.1 g/dL (ref 3.5–5.0)
BILIRUBIN TOTAL: 0.5 mg/dL (ref 0.3–1.2)
BUN: 15 mg/dL (ref 6–20)
CALCIUM: 8.7 mg/dL — AB (ref 8.9–10.3)
CO2: 26 mmol/L (ref 22–32)
Chloride: 104 mmol/L (ref 101–111)
Creatinine, Ser: 0.64 mg/dL (ref 0.61–1.24)
GFR calc non Af Amer: >60 mL/min (ref >60)
Glucose, Bld: 129 mg/dL — ABNORMAL HIGH (ref 65–99)
Potassium: 3.9 mmol/L (ref 3.5–5.1)
SODIUM: 136 mmol/L (ref 135–145)
TOTAL PROTEIN: 7.2 g/dL (ref 6.5–8.1)

## 2016-08-17 LAB — CBC WITH DIFFERENTIAL/PLATELET
BASOS ABS: 0.1 10*3/uL (ref 0–0.1)
BASOS PCT: 1 %
EOS PCT: 3 %
Eosinophils Absolute: 0.1 10*3/uL (ref 0–0.7)
HEMATOCRIT: 38.4 % — AB (ref 40.0–52.0)
Hemoglobin: 13.8 g/dL (ref 13.0–18.0)
Lymphocytes Relative: 40 %
Lymphs Abs: 2.3 10*3/uL (ref 1.0–3.6)
MCH: 33.2 pg (ref 26.0–34.0)
MCHC: 35.9 g/dL (ref 32.0–36.0)
MCV: 92.4 fL (ref 80.0–100.0)
MONO ABS: 0.7 10*3/uL (ref 0.2–1.0)
Monocytes Relative: 13 %
NEUTROS ABS: 2.5 10*3/uL (ref 1.4–6.5)
Neutrophils Relative %: 43 %
Platelets: 245 10*3/uL (ref 150–440)
RBC: 4.16 MIL/uL — ABNORMAL LOW (ref 4.40–5.90)
RDW: 13.3 % (ref 11.5–14.5)
WBC: 5.7 10*3/uL (ref 3.8–10.6)

## 2016-08-17 LAB — IRON AND TIBC
Iron: 47 ug/dL (ref 45–182)
SATURATION RATIOS: 17 % — AB (ref 17.9–39.5)
TIBC: 279 ug/dL (ref 250–450)
UIBC: 232 ug/dL

## 2016-08-17 LAB — FERRITIN: Ferritin: 86 ng/mL (ref 24–336)

## 2016-08-17 NOTE — Progress Notes (Signed)
[name]  [phone] [phone]  ID: Douglas Perkins. OB: [birth year]  MR#: [id number]  PJK#:[id number]  Patient Care Team: [name], MD as PCP - General (Family Medicine) Seeplaputhur Robinette Haines, MD (General Surgery)  CHIEF COMPLAINT:  Chief Complaint  Patient presents with  . merkel cell carcinoma    INTERVAL HISTORY: Patient returns to clinic today for routine yearly evaluation. His only complaint today is of a 40-50 pound weight gain over the past year. This contributes to increased weakness and fatigue as well as dyspnea on exertion. He otherwise feels well. He has had no recent fevers or illnesses.  He has no neurologic complaints. He has a good appetite.  He denies any chest pain or cough.  He denies any nausea, vomiting, constipation, or diarrhea.  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  He offers no further specific complaints today.   REVIEW OF SYSTEMS:   Review of Systems  Constitutional: Positive for malaise/fatigue. Negative for fever and weight loss.  Respiratory: Negative.  Negative for cough and shortness of breath.   Cardiovascular: Negative.  Negative for chest pain and leg swelling.  Gastrointestinal: Negative.  Negative for abdominal pain, blood in stool, constipation, diarrhea, melena, nausea and vomiting.  Musculoskeletal: Positive for back pain.  Neurological: Negative.  Negative for weakness.  Psychiatric/Behavioral: Negative.  Negative for depression. The patient is not nervous/anxious.     As per HPI. Otherwise, a complete review of systems is negative.  PAST MEDICAL HISTORY: Past Medical History:  Diagnosis Date  . Bipolar 1 disorder (Douglas Perkins)   . Cancer (HCC)    merkel cell cancer  . Cancer Douglas Perkins)    prostate  . Glaucoma   . Hypertension   . 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Douglas Perkins)   . 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 Presence of  permanent cardiac pacemaker   . TIA (transient ischemic attack)     PAST SURGICAL HISTORY: Past Surgical History:  Procedure Laterality Date  . CHOLECYSTECTOMY    . LEG SURGERY Left    distal  . Pace maker placement    . PROSTATE SURGERY    . PROSTATECTOMY    . SKIN CANCER EXCISION  2006,2007   Merkle Cell Carcinoma    FAMILY HISTORY: Heart disease and bipolar disease.    ADVANCED DIRECTIVES:    HEALTH MAINTENANCE: Social History  Substance Use Topics  . Smoking status: Former Research scientist (life sciences)  . Smokeless tobacco: Not on file  . Alcohol use No     Colonoscopy:  PAP:  Bone density:  Lipid panel:  Allergies  Allergen Reactions  . Other Hives, Shortness Of Breath and Other (See Comments)    Pt states that he is allergic to unwashed blood products.    . Feldene [Piroxicam] Hives  . Sulfa Antibiotics Hives    Current Outpatient Prescriptions  Medication Sig Dispense Refill  . acetaminophen (TYLENOL) 325 MG tablet Take 2 tablets (650 mg total) by mouth every 6 (six) hours as needed for mild pain or moderate pain.    . ARIPiprazole (ABILIFY) 5 MG tablet Take 5 mg by mouth 2 (two) times daily.     Marland Kitchen aspirin EC 81 MG tablet Take 81 mg by mouth daily. Reported on 08/18/2015    . atorvastatin (LIPITOR) 20 MG tablet Take 1 tablet (20 mg total) by mouth daily at 6 PM.    . Calcium 250 MG CAPS  Take 500 mg by mouth daily.    . carbamazepine (TEGRETOL) 200 MG tablet Take 600 mg by mouth 2 (two) times daily.    . dabigatran (PRADAXA) 150 MG CAPS capsule Take 150 mg by mouth 2 (two) times daily.    Marland Kitchen esomeprazole (NEXIUM) 20 MG capsule Take 20 mg by mouth 2 (two) times daily before a meal.    . ferrous sulfate 325 (65 FE) MG tablet Take 650 mg by mouth 3 (three) times daily with meals.     . gabapentin (NEURONTIN) 300 MG capsule Take 300 mg by mouth 2 (two) times daily.    Marland Kitchen glucosamine-chondroitin 500-400 MG tablet Take 1 tablet by mouth daily.    Marland Kitchen latanoprost (XALATAN) 0.005 %  ophthalmic solution Place 1 drop into both eyes at bedtime.    Marland Kitchen lisinopril (PRINIVIL,ZESTRIL) 40 MG tablet Take 40 mg by mouth 2 (two) times daily.     . metoprolol succinate (TOPROL-XL) 50 MG 24 hr tablet Take 50 mg by mouth 2 (two) times daily. Take with or immediately following a meal.    . Multiple Vitamin (MULTIVITAMIN WITH MINERALS) TABS tablet Take 1 tablet by mouth daily.    . Omega-3 Fatty Acids (FISH OIL) 1000 MG CAPS Take 1,000 mg by mouth 2 (two) times daily.     . QUEtiapine (SEROQUEL) 300 MG tablet Take 600 mg by mouth at bedtime.    . temazepam (RESTORIL) 15 MG capsule Take 15 mg by mouth at bedtime as needed for sleep.    Marland Kitchen timolol (BETIMOL) 0.5 % ophthalmic solution Place 1 drop into the right eye daily.     No current facility-administered medications for this visit.     OBJECTIVE: Vitals:   08/17/16 1537  BP: (!) 207/94  Pulse: 60  Resp: 18  Temp: 98.1 F (36.7 C)     Body mass index is 34.69 kg/m.    ECOG FS:0 - Asymptomatic  General: Well-developed, well-nourished, no acute distress. Eyes: Pink conjunctiva, anicteric sclera. Lungs: Clear to auscultation bilaterally. Heart: Regular rate and rhythm. No rubs, murmurs, or gallops. Abdomen: Soft, nontender, nondistended. No organomegaly noted, normoactive bowel sounds. Musculoskeletal: No edema, cyanosis, or clubbing. Neuro: Alert, answering all questions appropriately. Cranial nerves grossly intact. Skin: No rashes or petechiae noted. Psych: Normal affect.   LAB RESULTS:  Lab Results  Component Value Date   NA 136 08/17/2016   K 3.9 08/17/2016   CL 104 08/17/2016   CO2 26 08/17/2016   GLUCOSE 129 (H) 08/17/2016   BUN 15 08/17/2016   CREATININE 0.64 08/17/2016   CALCIUM 8.7 (L) 08/17/2016   PROT 7.2 08/17/2016   ALBUMIN 4.1 08/17/2016   AST 26 08/17/2016   ALT 29 08/17/2016   ALKPHOS 89 08/17/2016   BILITOT 0.5 08/17/2016   GFRNONAA >60 08/17/2016   GFRAA >60 08/17/2016    Lab Results    Component Value Date   WBC 5.7 08/17/2016   NEUTROABS 2.5 08/17/2016   HGB 13.8 08/17/2016   HCT 38.4 (L) 08/17/2016   MCV 92.4 08/17/2016   PLT 245 08/17/2016     STUDIES: No results found.  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 Adjuvant chemoradiation therapy completed in July of 2007.  PLAN:    1. Merkel cell:  PET scan from November 2014 was negative for any recurrence, no further imaging is necessary.  The remainder of his laboratory work is also within normal limits.  No intervention is needed at this time.  Although patient is over 10 years out from his adjuvant treatment, he has requested extended follow-up therefore will return to clinic in 1 year for further evaluation. No further imaging is necessary. Recent MRI of the abdomen revealed benign liver lesions.  2. Hypertension: Blood pressure is significantly elevated today, continue current medications as prescribed. Continue treatment and monitoring per primary care.  3. Anemia: Resolved. Patient has a history of GI bleed.  4. Hyponatremia: Resolved.  5. Dyspnea on exertion: Likely secondary to weight gain. Have recommended dietary changes and increased activity.  Patient expressed understanding and was in agreement with this plan. He also understands that He can call clinic at any time with any questions, concerns, or complaints.    Lloyd Huger, MD   08/19/2016 10:04 AM

## 2016-08-17 NOTE — Progress Notes (Signed)
Complains of shortness of breath.

## 2016-08-18 LAB — AFP TUMOR MARKER: AFP-Tumor Marker: 2 ng/mL (ref 0.0–8.3)

## 2016-09-29 ENCOUNTER — Other Ambulatory Visit
Admission: RE | Admit: 2016-09-29 | Discharge: 2016-09-29 | Disposition: A | Discharge status: Home / Self Care | Payer: Medicare Other | Source: Ambulatory Visit | Attending: Unknown Physician Specialty | Admitting: Unknown Physician Specialty

## 2016-09-29 DIAGNOSIS — Z79899 Other long term (current) drug therapy: Secondary | ICD-10-CM | POA: Insufficient documentation

## 2016-09-29 DIAGNOSIS — F329 Major depressive disorder, single episode, unspecified: Secondary | ICD-10-CM | POA: Diagnosis present

## 2016-09-29 LAB — ELECTROLYTE PANEL
Anion gap: 7 (ref 5–15)
CO2: 27 mmol/L (ref 22–32)
Chloride: 103 mmol/L (ref 101–111)
POTASSIUM: 4 mmol/L (ref 3.5–5.1)
SODIUM: 137 mmol/L (ref 135–145)

## 2016-09-29 LAB — CBC
HCT: 41.4 % (ref 40.0–52.0)
Hemoglobin: 14.5 g/dL (ref 13.0–18.0)
MCH: 32.8 pg (ref 26.0–34.0)
MCHC: 35.1 g/dL (ref 32.0–36.0)
MCV: 93.5 fL (ref 80.0–100.0)
PLATELETS: 235 10*3/uL (ref 150–440)
RBC: 4.43 MIL/uL (ref 4.40–5.90)
RDW: 13.6 % (ref 11.5–14.5)
WBC: 4.9 10*3/uL (ref 3.8–10.6)

## 2016-09-29 LAB — HEPATIC FUNCTION PANEL
ALBUMIN: 4.3 g/dL (ref 3.5–5.0)
ALT: 35 U/L (ref 17–63)
AST: 30 U/L (ref 15–41)
Alkaline Phosphatase: 79 U/L (ref 38–126)
Bilirubin, Direct: <0.1 mg/dL — ABNORMAL LOW (ref 0.1–0.5)
TOTAL PROTEIN: 7.4 g/dL (ref 6.5–8.1)
Total Bilirubin: 0.6 mg/dL (ref 0.3–1.2)

## 2016-09-29 LAB — LIPID PANEL
Cholesterol: 205 mg/dL — ABNORMAL HIGH (ref 0–200)
HDL: 66 mg/dL (ref >40)
LDL CALC: 119 mg/dL — AB (ref 0–99)
Total CHOL/HDL Ratio: 3.1 RATIO
Triglycerides: 101 mg/dL (ref <150)
VLDL: 20 mg/dL (ref 0–40)

## 2016-09-29 LAB — CARBAMAZEPINE LEVEL, TOTAL: CARBAMAZEPINE LVL: 9.3 ug/mL (ref 4.0–12.0)

## 2017-01-10 DIAGNOSIS — I2510 Atherosclerotic heart disease of native coronary artery without angina pectoris: Secondary | ICD-10-CM | POA: Insufficient documentation

## 2017-01-31 ENCOUNTER — Ambulatory Visit
Admission: RE | Admit: 2017-01-31 | Discharge: 2017-01-31 | Disposition: A | Discharge status: Home / Self Care | Payer: Medicare Other | Source: Ambulatory Visit | Attending: Unknown Physician Specialty | Admitting: Unknown Physician Specialty

## 2017-01-31 ENCOUNTER — Ambulatory Visit: Payer: Medicare Other | Admitting: Anesthesiology

## 2017-01-31 ENCOUNTER — Encounter
Admission: RE | Disposition: A | Discharge status: Home / Self Care | Payer: Self-pay | Source: Ambulatory Visit | Attending: Unknown Physician Specialty

## 2017-01-31 ENCOUNTER — Encounter: Payer: Self-pay | Admitting: *Deleted

## 2017-01-31 DIAGNOSIS — Z1211 Encounter for screening for malignant neoplasm of colon: Secondary | ICD-10-CM | POA: Insufficient documentation

## 2017-01-31 DIAGNOSIS — Z85828 Personal history of other malignant neoplasm of skin: Secondary | ICD-10-CM | POA: Diagnosis not present

## 2017-01-31 DIAGNOSIS — Z950 Presence of cardiac pacemaker: Secondary | ICD-10-CM | POA: Diagnosis not present

## 2017-01-31 DIAGNOSIS — F319 Bipolar disorder, unspecified: Secondary | ICD-10-CM | POA: Insufficient documentation

## 2017-01-31 DIAGNOSIS — Z87891 Personal history of nicotine dependence: Secondary | ICD-10-CM | POA: Diagnosis not present

## 2017-01-31 DIAGNOSIS — H409 Unspecified glaucoma: Secondary | ICD-10-CM | POA: Insufficient documentation

## 2017-01-31 DIAGNOSIS — Z8673 Personal history of transient ischemic attack (TIA), and cerebral infarction without residual deficits: Secondary | ICD-10-CM | POA: Diagnosis not present

## 2017-01-31 DIAGNOSIS — Z7982 Long term (current) use of aspirin: Secondary | ICD-10-CM | POA: Insufficient documentation

## 2017-01-31 DIAGNOSIS — D122 Benign neoplasm of ascending colon: Secondary | ICD-10-CM | POA: Diagnosis not present

## 2017-01-31 DIAGNOSIS — Z79899 Other long term (current) drug therapy: Secondary | ICD-10-CM | POA: Diagnosis not present

## 2017-01-31 DIAGNOSIS — D120 Benign neoplasm of cecum: Secondary | ICD-10-CM | POA: Diagnosis not present

## 2017-01-31 DIAGNOSIS — I10 Essential (primary) hypertension: Secondary | ICD-10-CM | POA: Diagnosis not present

## 2017-01-31 DIAGNOSIS — D123 Benign neoplasm of transverse colon: Secondary | ICD-10-CM | POA: Insufficient documentation

## 2017-01-31 DIAGNOSIS — K635 Polyp of colon: Secondary | ICD-10-CM | POA: Diagnosis not present

## 2017-01-31 HISTORY — PX: COLONOSCOPY WITH PROPOFOL: SHX5780

## 2017-01-31 SURGERY — COLONOSCOPY WITH PROPOFOL
Anesthesia: General

## 2017-01-31 MED ORDER — SODIUM CHLORIDE 0.9 % IV SOLN: INTRAVENOUS | Status: DC

## 2017-01-31 MED ORDER — PROPOFOL 10 MG/ML IV BOLUS
INTRAVENOUS | Status: DC | PRN
  Administered 2017-01-31: 70 mg via INTRAVENOUS

## 2017-01-31 MED ORDER — PHENYLEPHRINE HCL 10 MG/ML IJ SOLN
INTRAMUSCULAR | Status: DC | PRN
  Administered 2017-01-31: 50 ug via INTRAVENOUS

## 2017-01-31 MED ORDER — PROPOFOL 500 MG/50ML IV EMUL
INTRAVENOUS | Status: AC
  Filled 2017-01-31: qty 50

## 2017-01-31 MED ORDER — PROPOFOL 500 MG/50ML IV EMUL
INTRAVENOUS | Status: DC | PRN
  Administered 2017-01-31: 140 ug/kg/min via INTRAVENOUS

## 2017-01-31 MED ORDER — LIDOCAINE HCL (PF) 2 % IJ SOLN
INTRAMUSCULAR | Status: AC
  Filled 2017-01-31: qty 10

## 2017-01-31 MED ORDER — SODIUM CHLORIDE 0.9 % IV SOLN
INTRAVENOUS | Status: DC
  Administered 2017-01-31: 13:00:00 via INTRAVENOUS

## 2017-01-31 MED ORDER — PHENYLEPHRINE HCL 10 MG/ML IJ SOLN
INTRAMUSCULAR | Status: AC
  Filled 2017-01-31: qty 1

## 2017-01-31 NOTE — H&P (Signed)
Primary Care Physician:  [name], MD Primary Gastroenterologist:  Dr. Vira Agar  Pre-Procedure History & Physical: HPI:  Douglas Perkins. is a 71 y.o. male is here for an colonoscopy.   Past Medical History:  Diagnosis Date  . Bipolar 1 disorder ([place])   . Cancer (HCC)    merkel cell cancer  . Cancer [facility])    prostate  . Glaucoma   . Hypertension   . 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[place])   . 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 Presence of permanent cardiac pacemaker   . TIA (transient ischemic attack)     Past Surgical History:  Procedure Laterality Date  . CHOLECYSTECTOMY    . LEG SURGERY Left    distal  . Pace maker placement    . PROSTATE SURGERY    . PROSTATECTOMY    . SKIN CANCER EXCISION  2006,2007   Merkle Cell Carcinoma    Prior to Admission medications   Medication Sig Start Date End Date Taking? Authorizing Provider  aspirin EC 81 MG tablet Take 81 mg by mouth daily. Reported on 08/18/2015   Yes [provider]  atorvastatin (LIPITOR) 20 MG tablet Take 1 tablet (20 mg total) by mouth daily at 6 PM. 06/27/15  Yes Sainani, Belia Heman, MD  Calcium 250 MG CAPS Take 500 mg by mouth daily.   Yes [provider]  carbamazepine (TEGRETOL) 200 MG tablet Take 600 mg by mouth 2 (two) times daily.   Yes [provider]  ferrous sulfate 325 (65 FE) MG tablet Take 650 mg by mouth 3 (three) times daily with meals.    Yes [provider]  gabapentin (NEURONTIN) 300 MG capsule Take 300 mg by mouth 2 (two) times daily.   Yes [provider]  glucosamine-chondroitin 500-400 MG tablet Take 1 tablet by mouth daily.   Yes [provider]  latanoprost (XALATAN) 0.005 % ophthalmic solution Place 1 drop into both eyes at bedtime.   Yes [provider]  lisinopril (PRINIVIL,ZESTRIL) 40 MG tablet Take 40 mg by mouth 2 (two) times daily.    Yes [provider]  metoprolol succinate (TOPROL-XL) 50 MG 24 hr tablet Take 50  mg by mouth 2 (two) times daily. Take with or immediately following a meal.   Yes [provider]  Multiple Vitamin (MULTIVITAMIN WITH MINERALS) TABS tablet Take 1 tablet by mouth daily.   Yes [provider]  Omega-3 Fatty Acids (FISH OIL) 1000 MG CAPS Take 1,000 mg by mouth 2 (two) times daily.    Yes [provider]  QUEtiapine (SEROQUEL) 300 MG tablet Take 600 mg by mouth at bedtime.   Yes [provider]  temazepam (RESTORIL) 15 MG capsule Take 15 mg by mouth at bedtime as needed for sleep.   Yes [provider]  timolol (BETIMOL) 0.5 % ophthalmic solution Place 1 drop into the right eye daily.   Yes [provider]  acetaminophen (TYLENOL) 325 MG tablet Take 2 tablets (650 mg total) by mouth every 6 (six) hours as needed for mild pain or moderate pain. 07/12/15   Gouru, Illene Silver, MD  ARIPiprazole (ABILIFY) 5 MG tablet Take 5 mg by mouth 2 (two) times daily.     [provider]  dabigatran (PRADAXA) 150 MG CAPS capsule Take 150 mg by mouth 2 (two) times daily.    [provider]  esomeprazole (NEXIUM) 20 MG capsule Take 20 mg by mouth 2 (two) times daily before  a meal.    [provider]    Allergies as of 11/23/2016 - Review Complete 08/17/2016  Allergen Reaction Noted  . Other Hives, Shortness Of Breath, and Other (See Comments) 05/21/2015  . Feldene [piroxicam] Hives 11/29/2014  . Sulfa antibiotics Hives 08/06/2012    Family History  Problem Relation Age of Onset  . Stroke Father   . CAD Paternal Grandmother   . CAD Paternal Grandfather     Social History   Social History  . Marital status: Married    Spouse name: N/A  . Number of children: N/A  . Years of education: N/A   Occupational History  . Not on file.   Social History Main Topics  . Smoking status: Former Research scientist (life sciences)  . Smokeless tobacco: Never Used  . Alcohol use No  . Drug use: No  . Sexual activity: Not on file   Other Topics Concern   . Not on file   Social History Narrative  . No narrative on file    Review of Systems: See HPI, otherwise negative ROS  Physical Exam: BP (!) 200/77   Pulse 60   Temp 97.6 F (36.4 C) (Tympanic)   Resp 20   Ht 6' (1.829 m)   Wt 108.9 kg (240 lb)   SpO2 96%   BMI 32.55 kg/m  General:   Alert,  pleasant and cooperative in NAD Head:  Normocephalic and atraumatic. Neck:  Supple; no masses or thyromegaly. Lungs:  Clear throughout to auscultation.    Heart:  Regular rate and rhythm. Abdomen:  Soft, nontender and nondistended. Normal bowel sounds, without guarding, and without rebound.   Neurologic:  Alert and  oriented x4;  grossly normal neurologically.  Impression/Plan: [name]. is here for an colonoscopy to be performed for colon cancer screening.  Risks, benefits, limitations, and alternatives regarding  colonoscopy have been reviewed with the patient.  Questions have been answered.  All parties agreeable.   Gaylyn Cheers, MD  01/31/2017, 2:17 PM

## 2017-01-31 NOTE — Anesthesia Post-op Follow-up Note (Signed)
Anesthesia QCDR form completed.        

## 2017-01-31 NOTE — Anesthesia Preprocedure Evaluation (Signed)
Anesthesia Evaluation  Patient identified by MRN, date of birth, ID band  Reviewed: Allergy & Precautions, NPO status , Patient's Chart, lab work & pertinent test results  Airway Mallampati: III  TM Distance: <3 FB     Dental  (+) Caps, Chipped   Pulmonary pneumonia, resolved, former smoker,    Pulmonary exam normal        Cardiovascular hypertension, Pt. on medications Normal cardiovascular exam+ pacemaker      Neuro/Psych PSYCHIATRIC DISORDERS Bipolar Disorder TIA   GI/Hepatic Neg liver ROS, Colon polyps   Endo/Other  negative endocrine ROS  Renal/GU negative Renal ROS  negative genitourinary   Musculoskeletal negative musculoskeletal ROS (+)   Abdominal (+) + obese,   Peds negative pediatric ROS (+)  Hematology Hx of lymphoma   Anesthesia Other Findings Past Medical History: No date: Bipolar 1 disorder (HCC) No date: Cancer ([place])     Comment:  merkel cell cancer No date: Cancer ([place])     Comment:  prostate No date: Glaucoma No date: Hypertension No date: 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HCC) No date: Mural thrombus of cardiac apex No date: Presence of permanent cardiac pacemaker No date: TIA (transient ischemic attack)  Reproductive/Obstetrics                             Anesthesia Physical Anesthesia Plan  ASA: III  Anesthesia Plan: General   Post-op Pain Management:    Induction: Intravenous  PONV Risk Score and Plan:   Airway Management Planned: Nasal Cannula  Additional Equipment:   Intra-op Plan:   Post-operative Plan:   Informed Consent: I have reviewed the patients History and Physical, chart, labs and discussed the procedure including the risks, benefits and alternatives for the proposed anesthesia with the patient or authorized representative who has indicated his/her understanding and acceptance.   Dental advisory given  Plan Discussed with: CRNA and  Surgeon  Anesthesia Plan Comments:         Anesthesia Quick Evaluation

## 2017-01-31 NOTE — Transfer of Care (Signed)
Immediate Anesthesia Transfer of Care Note  Patient: Douglas Perkins.  Procedure(s) Performed: COLONOSCOPY WITH PROPOFOL (N/A )  Patient Location: PACU  Anesthesia Type:General  Level of Consciousness: awake, alert  and oriented  Airway & Oxygen Therapy: Patient Spontanous Breathing and Patient connected to nasal cannula oxygen  Post-op Assessment: Report given to RN and Post -op Vital signs reviewed and stable  Post vital signs: Reviewed and stable  Last Vitals:  Vitals:   01/31/17 1506 01/31/17 1507  BP: 132/70 132/70  Pulse: (!) 59 60  Resp: 17 16  Temp: 36.5 C 36.5 C  SpO2: 98% 98%    Last Pain:  Vitals:   01/31/17 1506  TempSrc: Tympanic         Complications: No apparent anesthesia complications

## 2017-01-31 NOTE — Op Note (Signed)
[facility] Gastroenterology Patient Name: Douglas Perkins Procedure Date: 01/31/2017 2:18 PM MRN: [id number] Account #: [id number] Date of Birth: 12/01/1945 Admit Type: Outpatient Age: 72 Room: [facility] ENDO ROOM 3 Gender: Male Note Status: Finalized Procedure:            Colonoscopy Indications:          Screening for colorectal malignant neoplasm Providers:            Manya Silvas, MD Referring MD:         [name] (Referring MD) Medicines:            Propofol per Anesthesia Complications:        No immediate complications. Procedure:            Pre-Anesthesia Assessment:                       - After reviewing the risks and benefits, the patient                        was deemed in satisfactory condition to undergo the                        procedure.                       After obtaining informed consent, the colonoscope was                        passed under direct vision. Throughout the procedure,                        the patient's blood pressure, pulse, and oxygen                        saturations were monitored continuously. The                        Colonoscope was introduced through the anus and                        advanced to the the cecum, identified by appendiceal                        orifice and ileocecal valve. The colonoscopy was                        performed without difficulty. The patient tolerated the                        procedure well. The quality of the bowel preparation                        was good. Findings:      Four sessile polyps were found in the cecum. The polyps were small in       size. These polyps were removed with a hot snare. Resection and       retrieval were complete.      Two sessile polyps were found in the cecum. The polyps were diminutive       in size. These polyps were removed with a jumbo cold forceps. Resection  and retrieval were complete.      A diminutive polyp was found in the  ascending colon. The polyp was       sessile. The polyp was removed with a jumbo cold forceps. Resection and       retrieval were complete.      A small polyp was found in the ascending colon. The polyp was sessile.       The polyp was removed with a hot snare. Resection and retrieval were       complete.      Three sessile polyps were found in the transverse colon. The polyps were       diminutive in size. These polyps were removed with a jumbo cold forceps.       Resection and retrieval were complete.      Three sessile polyps were found in the recto-sigmoid colon. The polyps       were diminutive in size. These polyps were removed with a jumbo cold       forceps. Resection and retrieval were complete.      The exam was otherwise without abnormality.      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 Impression:           - Four small polyps in the cecum, removed with a hot                        snare. Resected and retrieved.                       - Two diminutive polyps in the cecum, removed with a                        jumbo cold forceps. Resected and retrieved.                       - One diminutive polyp in the ascending colon, removed                        with a jumbo cold forceps. Resected and retrieved.                       - One small polyp in the ascending colon, removed with                        a hot snare. Resected and retrieved.                       - Three diminutive polyps in the transverse colon,                        removed with a jumbo cold forceps. Resected and                        retrieved.                       - Three diminutive polyps at the recto-sigmoid colon,                        removed with a jumbo cold forceps. Resected and  retrieved.                       - The examination was otherwise normal. Recommendation:       - Await pathology results. Manya Silvas, MD 01/31/2017 3:07:59 PM This report  has been signed electronically. Number of Addenda: 0 Note Initiated On: 01/31/2017 2:18 PM Scope Withdrawal Time: 0 hours 27 minutes 48 seconds  Total Procedure Duration: 0 hours 34 minutes 55 seconds       [facility]

## 2017-02-01 ENCOUNTER — Encounter: Payer: Self-pay | Admitting: Unknown Physician Specialty

## 2017-02-01 NOTE — Anesthesia Postprocedure Evaluation (Signed)
Anesthesia Post Note  Patient: Douglas Perkins.  Procedure(s) Performed: COLONOSCOPY WITH PROPOFOL (N/A )  Patient location during evaluation: PACU Anesthesia Type: General Level of consciousness: awake and alert and oriented Pain management: pain level controlled Vital Signs Assessment: post-procedure vital signs reviewed and stable Respiratory status: spontaneous breathing Cardiovascular status: blood pressure returned to baseline Anesthetic complications: no     Last Vitals:  Vitals:   01/31/17 1526 01/31/17 1536  BP: (!) 172/85 (!) 205/89  Pulse: (!) 59 (!) 58  Resp: (!) 22 16  Temp:    SpO2: 100% 100%    Last Pain:  Vitals:   01/31/17 1506  TempSrc: Tympanic                 Sanaai Doane

## 2017-02-02 LAB — SURGICAL PATHOLOGY

## 2017-02-08 ENCOUNTER — Emergency Department: Payer: Medicare Other

## 2017-02-08 ENCOUNTER — Emergency Department
Admission: EM | Admit: 2017-02-08 | Discharge: 2017-02-08 | Disposition: A | Discharge status: Home / Self Care | Payer: Medicare Other | Attending: Emergency Medicine | Admitting: Emergency Medicine

## 2017-02-08 ENCOUNTER — Encounter: Payer: Self-pay | Admitting: Emergency Medicine

## 2017-02-08 DIAGNOSIS — R55 Syncope and collapse: Secondary | ICD-10-CM | POA: Insufficient documentation

## 2017-02-08 DIAGNOSIS — Z79899 Other long term (current) drug therapy: Secondary | ICD-10-CM | POA: Insufficient documentation

## 2017-02-08 DIAGNOSIS — Z7982 Long term (current) use of aspirin: Secondary | ICD-10-CM | POA: Insufficient documentation

## 2017-02-08 DIAGNOSIS — Z8546 Personal history of malignant neoplasm of prostate: Secondary | ICD-10-CM | POA: Diagnosis not present

## 2017-02-08 DIAGNOSIS — Z85821 Personal history of Merkel cell carcinoma: Secondary | ICD-10-CM | POA: Diagnosis not present

## 2017-02-08 DIAGNOSIS — Z87891 Personal history of nicotine dependence: Secondary | ICD-10-CM | POA: Insufficient documentation

## 2017-02-08 DIAGNOSIS — Z8673 Personal history of transient ischemic attack (TIA), and cerebral infarction without residual deficits: Secondary | ICD-10-CM | POA: Diagnosis not present

## 2017-02-08 DIAGNOSIS — I10 Essential (primary) hypertension: Secondary | ICD-10-CM | POA: Insufficient documentation

## 2017-02-08 LAB — URINE DRUG SCREEN, QUALITATIVE (ARMC ONLY)
Amphetamines, Ur Screen: NOT DETECTED
BARBITURATES, UR SCREEN: NOT DETECTED
Benzodiazepine, Ur Scrn: POSITIVE — AB
Cannabinoid 50 Ng, Ur ~~LOC~~: NOT DETECTED
Cocaine Metabolite,Ur ~~LOC~~: NOT DETECTED
MDMA (Ecstasy)Ur Screen: NOT DETECTED
Methadone Scn, Ur: NOT DETECTED
Opiate, Ur Screen: NOT DETECTED
Phencyclidine (PCP) Ur S: NOT DETECTED
Tricyclic, Ur Screen: POSITIVE — AB

## 2017-02-08 LAB — URINALYSIS, COMPLETE (UACMP) WITH MICROSCOPIC
BACTERIA UA: NONE SEEN
Bilirubin Urine: NEGATIVE
GLUCOSE, UA: NEGATIVE mg/dL
HGB URINE DIPSTICK: NEGATIVE
KETONES UR: NEGATIVE mg/dL
LEUKOCYTES UA: NEGATIVE
NITRITE: NEGATIVE
Protein, ur: 30 mg/dL — AB
Specific Gravity, Urine: 1.023 (ref 1.005–1.030)
Squamous Epithelial / LPF: NONE SEEN
pH: 6 (ref 5.0–8.0)

## 2017-02-08 LAB — COMPREHENSIVE METABOLIC PANEL
ALK PHOS: 73 U/L (ref 38–126)
ALT: 32 U/L (ref 17–63)
ANION GAP: 8 (ref 5–15)
AST: 29 U/L (ref 15–41)
Albumin: 3.6 g/dL (ref 3.5–5.0)
BUN: 18 mg/dL (ref 6–20)
CALCIUM: 8.6 mg/dL — AB (ref 8.9–10.3)
CO2: 27 mmol/L (ref 22–32)
Chloride: 97 mmol/L — ABNORMAL LOW (ref 101–111)
Creatinine, Ser: 0.83 mg/dL (ref 0.61–1.24)
GFR calc Af Amer: >60 mL/min (ref >60)
GFR calc non Af Amer: >60 mL/min (ref >60)
GLUCOSE: 117 mg/dL — AB (ref 65–99)
Potassium: 4.1 mmol/L (ref 3.5–5.1)
SODIUM: 132 mmol/L — AB (ref 135–145)
Total Bilirubin: 0.5 mg/dL (ref 0.3–1.2)
Total Protein: 6.6 g/dL (ref 6.5–8.1)

## 2017-02-08 LAB — CBC WITH DIFFERENTIAL/PLATELET
BASOS ABS: 0.1 10*3/uL (ref 0–0.1)
BASOS PCT: 1 %
Eosinophils Absolute: 0.2 10*3/uL (ref 0–0.7)
Eosinophils Relative: 4 %
HEMATOCRIT: 36.9 % — AB (ref 40.0–52.0)
HEMOGLOBIN: 13.4 g/dL (ref 13.0–18.0)
Lymphocytes Relative: 34 %
Lymphs Abs: 1.9 10*3/uL (ref 1.0–3.6)
MCH: 33.1 pg (ref 26.0–34.0)
MCHC: 36.3 g/dL — ABNORMAL HIGH (ref 32.0–36.0)
MCV: 91.4 fL (ref 80.0–100.0)
MONOS PCT: 13 %
Monocytes Absolute: 0.7 10*3/uL (ref 0.2–1.0)
NEUTROS ABS: 2.6 10*3/uL (ref 1.4–6.5)
NEUTROS PCT: 48 %
Platelets: 250 10*3/uL (ref 150–440)
RBC: 4.04 MIL/uL — ABNORMAL LOW (ref 4.40–5.90)
RDW: 12.9 % (ref 11.5–14.5)
WBC: 5.4 10*3/uL (ref 3.8–10.6)

## 2017-02-08 LAB — TROPONIN I: Troponin I: <0.03 ng/mL (ref <0.03)

## 2017-02-08 LAB — GLUCOSE, CAPILLARY: Glucose-Capillary: 118 mg/dL — ABNORMAL HIGH (ref 65–99)

## 2017-02-08 MED ORDER — SODIUM CHLORIDE 0.9 % IV BOLUS (SEPSIS)
500.0000 mL | Freq: Once | INTRAVENOUS | Status: AC
  Administered 2017-02-08: 500 mL via INTRAVENOUS

## 2017-02-08 NOTE — ED Provider Notes (Addendum)
[facility] Emergency Department Provider Note  ____________________________________________   I have reviewed the triage vital signs and the nursing notes.   HISTORY  Chief Complaint Near Syncope    HPI Douglas Perkins. is a 71 y.o. male who presents today complaining of feeling lightheaded. He did have low blood pressure for EMS and initially but that is,. Initially, no further history is available, patient just states sometimes he gets lightheaded. However, after arrival we do talk to friends and family including his wife on the phone and they indicate that the patient took his right medications this point. His ristoril and seroquel  he is only supposed to take at night, , the patient's wife states that when he gets these things confused which she sometimes does he "gets real sleepy and has to sleep it off". He states those meds will "knock him right out"     Past Medical History:  Diagnosis Date  . Bipolar 1 disorder ([place])   . Cancer (HCC)    merkel cell cancer  . Cancer [facility])    prostate  . Glaucoma   . Hypertension   . 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 Presence of permanent cardiac pacemaker   . TIA (transient ischemic attack)     Patient Active Problem List   Diagnosis Date Noted  . Syncope 07/19/2015  . Sepsis ([place]) 07/07/2015  . Bipolar I disorder ([place]) 06/24/2015  . Bilateral pneumonia 06/23/2015  . 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 Bradycardia 02/08/2015  . 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 AF (amaurosis fugax) 09/16/2014  . Benign essential HTN 09/16/2014  . Chest pain 09/16/2014  . Skin cyst 08/06/2012  . Personal history of lymphoma 08/06/2012  . Cancer [facility])     Past Surgical History:  Procedure Laterality Date  . CHOLECYSTECTOMY    . COLONOSCOPY WITH PROPOFOL N/A 01/31/2017   Procedure: COLONOSCOPY WITH PROPOFOL;  Surgeon: Manya Silvas, MD;   Location: [facility] ENDOSCOPY;  Service: Endoscopy;  Laterality: N/A;  . LEG SURGERY Left    distal  . Pace maker placement    . PROSTATE SURGERY    . PROSTATECTOMY    . SKIN CANCER EXCISION  2006,2007   Merkle Cell Carcinoma    Prior to Admission medications   Medication Sig Start Date End Date Taking? Authorizing Provider  acetaminophen (TYLENOL) 325 MG tablet Take 2 tablets (650 mg total) by mouth every 6 (six) hours as needed for mild pain or moderate pain. 07/12/15   Gouru, Illene Silver, MD  ARIPiprazole (ABILIFY) 5 MG tablet Take 5 mg by mouth 2 (two) times daily.     [provider]  aspirin EC 81 MG tablet Take 81 mg by mouth daily. Reported on 08/18/2015    [provider]  atorvastatin (LIPITOR) 20 MG tablet Take 1 tablet (20 mg total) by mouth daily at 6 PM. 06/27/15   Henreitta Leber, MD  Calcium 250 MG CAPS Take 500 mg by mouth daily.    [provider]  carbamazepine (TEGRETOL) 200 MG tablet Take 600 mg by mouth 2 (two) times daily.    [provider]  dabigatran (PRADAXA) 150 MG CAPS capsule Take 150 mg by mouth 2 (two) times daily.    [provider]  esomeprazole (NEXIUM) 20 MG capsule Take 20 mg by mouth 2 (two) times daily before a meal.    [provider]  ferrous sulfate 325 (65 FE) MG tablet Take 650 mg by mouth 3 (three) times daily with meals.     [provider]  gabapentin (NEURONTIN) 300 MG capsule Take 300 mg by mouth 2 (two) times daily.    [provider]  glucosamine-chondroitin 500-400 MG tablet Take 1 tablet by mouth daily.    [provider]  latanoprost (XALATAN) 0.005 % ophthalmic solution Place 1 drop into both eyes at bedtime.    [provider]  lisinopril (PRINIVIL,ZESTRIL) 40 MG tablet Take 40 mg by mouth 2 (two) times daily.     [provider]  metoprolol succinate (TOPROL-XL) 50 MG 24 hr tablet Take 50 mg by mouth 2 (two) times daily. Take with or immediately  following a meal.    [provider]  Multiple Vitamin (MULTIVITAMIN WITH MINERALS) TABS tablet Take 1 tablet by mouth daily.    [provider]  Omega-3 Fatty Acids (FISH OIL) 1000 MG CAPS Take 1,000 mg by mouth 2 (two) times daily.     [provider]  QUEtiapine (SEROQUEL) 300 MG tablet Take 600 mg by mouth at bedtime.    [provider]  temazepam (RESTORIL) 15 MG capsule Take 15 mg by mouth at bedtime as needed for sleep.    [provider]  timolol (BETIMOL) 0.5 % ophthalmic solution Place 1 drop into the right eye daily.    [provider]    Allergies Other; Feldene [piroxicam]; and Sulfa antibiotics  Family History  Problem Relation Age of Onset  . Stroke Father   . CAD Paternal Grandmother   . CAD Paternal Grandfather     Social History Social History  Substance Use Topics  . Smoking status: Former Research scientist (life sciences)  . Smokeless tobacco: Never Used  . Alcohol use No    Review of Systems Constitutional: No fever/chills Eyes: No visual changes. ENT: No sore throat. No stiff neck no neck pain Cardiovascular: Denies chest pain. Respiratory: Denies shortness of breath. Gastrointestinal:   no vomiting.  No diarrhea.  No constipation. Genitourinary: Negative for dysuria. Musculoskeletal: Negative lower extremity swelling Skin: Negative for rash. Neurological: Negative for severe headaches, focal weakness or numbness.   ____________________________________________   PHYSICAL EXAM:  VITAL SIGNS: ED Triage Vitals [02/08/17 0852]  Enc Vitals Group     BP      Pulse      Resp      Temp      Temp src      SpO2      Weight 240 lb (108.9 kg)     Height 6' (1.829 m)     Head Circumference      Peak Flow      Pain Score      Pain Loc      Pain Edu?      Excl. in [place]?     Constitutional: somnolent but oriented. Well appearing and in no acute distress. Eyes: Conjunctivae are normal Head: Atraumatic HEENT: No  congestion/rhinnorhea. Mucous membranes are moist.  Oropharynx non-erythematous Neck:   Nontender with no meningismus, no masses, no stridor Cardiovascular: Normal rate, regular rhythm. Grossly normal heart sounds.  Good peripheral circulation. Respiratory: Normal respiratory effort.  No retractions. Lungs CTAB. Abdominal: Soft and nontender. No distention. No guarding no rebound Back:  There is no focal tenderness or step off.  there is no midline tenderness there are no lesions noted. there is no CVA tenderness Musculoskeletal: No lower extremity tenderness, no upper extremity tenderness.  No joint effusions, no DVT signs strong distal pulses no edema Neurologic: patient is slightly slow somnolence but non-aphasic with no evidence of word finding difficulty. Neurologic exam otherwise normal. No gross focal neurologic deficits are appreciated.  Skin:  Skin is warm, dry and intact. No rash noted. Psychiatric: Mood and affect are normal. Speech and behavior are normal.  ____________________________________________   LABS (all labs ordered are listed, but only abnormal results are displayed)  Labs Reviewed  GLUCOSE, CAPILLARY - Abnormal; Notable for the following:       Result Value   Glucose-Capillary 118 (*)    All other components within normal limits  COMPREHENSIVE METABOLIC PANEL  CBC WITH DIFFERENTIAL/PLATELET  TROPONIN I  URINALYSIS, COMPLETE (UACMP) WITH MICROSCOPIC  URINE DRUG SCREEN, QUALITATIVE ([facility] ONLY)    Pertinent labs  results that were available during my care of the patient were reviewed by me and considered in my medical decision making (see chart for details). ____________________________________________  EKG  I personally interpreted any EKGs ordered by me or triage sinus rhythm, rate 60 bpm,, right bundle branch block and LAFB noted, no acute ischemic  changes ____________________________________________  RADIOLOGY  Pertinent labs & imaging results that  were available during my care of the patient were reviewed by me and considered in my medical decision making (see chart for details). If possible, patient and/or family made aware of any abnormal findings. ____________________________________________    PROCEDURES  Procedure(s) performed: None  Procedures  Critical Care performed: None  ____________________________________________   INITIAL IMPRESSION / ASSESSMENT AND PLAN / ED COURSE  Pertinent labs & imaging results that were available during my care of the patient were reviewed by me and considered in my medical decision making (see chart for details).  patient here somnolent after taking his medications erroneously. No evidence of or thoughts of self-harm. We will observe the patient, and initial workup has been ordered prior to information about medication, we'll still obtain CT scan of the head to make sure that there is no ischemic process from his hypotension earlier but very low suspicion.  Clinical Course as of Feb 08 1329  Thu Feb 08, 2017  1021 patient resting comfortably at this time, still is somewhat sleepy he states but otherwise doing well. Neurologic exam reassuring. Workup here reassuring thus far.  [JM]  1309 patient wide awake requesting discharge no other acute pathology noted,  [JM]    Clinical Course User Index [JM] Schuyler Amor, MD   _____________________________  blood pressure elevated on discharge, identifying since the patient is asymptomatic no chest pain or shortness of breath no headache, he declines to stay any further, he will take his blood pressure meds as he gets home. this is likely because of his medications that he was taking erroneously this morning. He will recheck. He declines further workup and he is eager to go home. Family is here to take him. Return precautions and follow-up given and understood   FINAL CLINICAL IMPRESSION(S) / ED DIAGNOSES  Final diagnoses:  None       This chart was dictated using voice recognition software.  Despite best efforts to proofread,  errors can occur which can change meaning.      Schuyler Amor, MD 02/08/17 3614    Schuyler Amor, MD 02/08/17 1330    Schuyler Amor, MD 02/08/17 1331    Schuyler Amor, MD 02/08/17 [phone]

## 2017-02-08 NOTE — ED Triage Notes (Signed)
Patient presents to ED via [facility] from [facility] where patient had a near syncopal episode. EMS report patient was pale and clammy upon arrival with a BP of 90/40. Upon arrival to ED patient is lethargic but arouses to verbal stimuli. Per family, patient took his evening medication this morning. Patient denies pain.

## 2017-02-08 NOTE — ED Notes (Signed)
Patient states he took 15 mg of suboxone and 450mg  of Seroquel this morning which he normally takes at night.

## 2017-02-08 NOTE — ED Notes (Signed)
PT walked in hall way. He was steady on his feet. I called his wife and she will be here in 20 minutes.

## 2017-02-08 NOTE — ED Notes (Signed)
Patient transported to CT at this time by CT staff.

## 2017-02-08 NOTE — Discharge Instructions (Signed)
be very careful taking medications, keep them separate night medications and a medications. Return to the emergency room for any new or worrisome symptoms

## 2017-02-08 NOTE — ED Notes (Signed)
Pt back to bed without assistance. NAD. Call light within reach.

## 2017-02-08 NOTE — ED Notes (Signed)
Pt up to bathroom at this time. He is steady on his feet. Will continue to monitor for changes.

## 2017-02-08 NOTE — ED Notes (Signed)
Patients wife called to check on patient. Received patients consent to speak to his wife. Patients wife updated on plan of care. She instructs to call her once patient is up for discharge and she will come and get him.

## 2017-05-22 ENCOUNTER — Observation Stay
Admit: 2017-05-22 | Discharge: 2017-05-22 | Disposition: A | Discharge status: Home / Self Care | Payer: Medicare Other | Attending: Family Medicine | Admitting: Family Medicine

## 2017-05-22 ENCOUNTER — Observation Stay
Admission: EM | Admit: 2017-05-22 | Discharge: 2017-05-23 | Disposition: A | Discharge status: Home / Self Care | Payer: Medicare Other | Attending: Internal Medicine | Admitting: Internal Medicine

## 2017-05-22 ENCOUNTER — Other Ambulatory Visit: Payer: Self-pay

## 2017-05-22 ENCOUNTER — Encounter: Payer: Self-pay | Admitting: Emergency Medicine

## 2017-05-22 ENCOUNTER — Emergency Department: Payer: Medicare Other

## 2017-05-22 DIAGNOSIS — I10 Essential (primary) hypertension: Secondary | ICD-10-CM | POA: Insufficient documentation

## 2017-05-22 DIAGNOSIS — Z8249 Family history of ischemic heart disease and other diseases of the circulatory system: Secondary | ICD-10-CM | POA: Diagnosis not present

## 2017-05-22 DIAGNOSIS — Z86718 Personal history of other venous thrombosis and embolism: Secondary | ICD-10-CM | POA: Diagnosis not present

## 2017-05-22 DIAGNOSIS — Z6833 Body mass index (BMI) 33.0-33.9, adult: Secondary | ICD-10-CM | POA: Insufficient documentation

## 2017-05-22 DIAGNOSIS — Z8673 Personal history of transient ischemic attack (TIA), and cerebral infarction without residual deficits: Secondary | ICD-10-CM | POA: Insufficient documentation

## 2017-05-22 DIAGNOSIS — I700 Atherosclerosis of aorta: Secondary | ICD-10-CM | POA: Diagnosis not present

## 2017-05-22 DIAGNOSIS — Z888 Allergy status to other drugs, medicaments and biological substances status: Secondary | ICD-10-CM | POA: Insufficient documentation

## 2017-05-22 DIAGNOSIS — R9431 Abnormal electrocardiogram [ECG] [EKG]: Secondary | ICD-10-CM

## 2017-05-22 DIAGNOSIS — I4510 Unspecified right bundle-branch block: Secondary | ICD-10-CM | POA: Diagnosis not present

## 2017-05-22 DIAGNOSIS — Z8546 Personal history of malignant neoplasm of prostate: Secondary | ICD-10-CM | POA: Diagnosis not present

## 2017-05-22 DIAGNOSIS — R001 Bradycardia, unspecified: Secondary | ICD-10-CM | POA: Insufficient documentation

## 2017-05-22 DIAGNOSIS — F319 Bipolar disorder, unspecified: Secondary | ICD-10-CM | POA: Insufficient documentation

## 2017-05-22 DIAGNOSIS — E785 Hyperlipidemia, unspecified: Secondary | ICD-10-CM | POA: Insufficient documentation

## 2017-05-22 DIAGNOSIS — Z9049 Acquired absence of other specified parts of digestive tract: Secondary | ICD-10-CM | POA: Insufficient documentation

## 2017-05-22 DIAGNOSIS — Z7982 Long term (current) use of aspirin: Secondary | ICD-10-CM | POA: Insufficient documentation

## 2017-05-22 DIAGNOSIS — H409 Unspecified glaucoma: Secondary | ICD-10-CM | POA: Diagnosis not present

## 2017-05-22 DIAGNOSIS — I951 Orthostatic hypotension: Principal | ICD-10-CM | POA: Insufficient documentation

## 2017-05-22 DIAGNOSIS — E669 Obesity, unspecified: Secondary | ICD-10-CM | POA: Insufficient documentation

## 2017-05-22 DIAGNOSIS — Z8572 Personal history of non-Hodgkin lymphomas: Secondary | ICD-10-CM | POA: Insufficient documentation

## 2017-05-22 DIAGNOSIS — Z87891 Personal history of nicotine dependence: Secondary | ICD-10-CM | POA: Insufficient documentation

## 2017-05-22 DIAGNOSIS — Z85828 Personal history of other malignant neoplasm of skin: Secondary | ICD-10-CM | POA: Insufficient documentation

## 2017-05-22 DIAGNOSIS — E7801 Familial hypercholesterolemia: Secondary | ICD-10-CM | POA: Diagnosis not present

## 2017-05-22 DIAGNOSIS — G453 Amaurosis fugax: Secondary | ICD-10-CM | POA: Diagnosis not present

## 2017-05-22 DIAGNOSIS — Z79899 Other long term (current) drug therapy: Secondary | ICD-10-CM | POA: Insufficient documentation

## 2017-05-22 DIAGNOSIS — Z882 Allergy status to sulfonamides status: Secondary | ICD-10-CM | POA: Diagnosis not present

## 2017-05-22 DIAGNOSIS — Z950 Presence of cardiac pacemaker: Secondary | ICD-10-CM | POA: Diagnosis not present

## 2017-05-22 DIAGNOSIS — R55 Syncope and collapse: Secondary | ICD-10-CM | POA: Diagnosis present

## 2017-05-22 DIAGNOSIS — Z823 Family history of stroke: Secondary | ICD-10-CM | POA: Insufficient documentation

## 2017-05-22 LAB — BASIC METABOLIC PANEL
Anion gap: 9 (ref 5–15)
BUN: 13 mg/dL (ref 6–20)
CHLORIDE: 100 mmol/L — AB (ref 101–111)
CO2: 28 mmol/L (ref 22–32)
CREATININE: 0.75 mg/dL (ref 0.61–1.24)
Calcium: 8.9 mg/dL (ref 8.9–10.3)
GFR calc non Af Amer: >60 mL/min (ref >60)
Glucose, Bld: 130 mg/dL — ABNORMAL HIGH (ref 65–99)
Potassium: 4.3 mmol/L (ref 3.5–5.1)
Sodium: 137 mmol/L (ref 135–145)

## 2017-05-22 LAB — TROPONIN I: Troponin I: <0.03 ng/mL (ref <0.03)

## 2017-05-22 LAB — CBC
HCT: 41.4 % (ref 40.0–52.0)
Hemoglobin: 14.4 g/dL (ref 13.0–18.0)
MCH: 33.1 pg (ref 26.0–34.0)
MCHC: 34.7 g/dL (ref 32.0–36.0)
MCV: 95.4 fL (ref 80.0–100.0)
PLATELETS: 267 10*3/uL (ref 150–440)
RBC: 4.34 MIL/uL — AB (ref 4.40–5.90)
RDW: 13.5 % (ref 11.5–14.5)
WBC: 5.3 10*3/uL (ref 3.8–10.6)

## 2017-05-22 LAB — URINALYSIS, COMPLETE (UACMP) WITH MICROSCOPIC
BILIRUBIN URINE: NEGATIVE
Bacteria, UA: NONE SEEN
Glucose, UA: NEGATIVE mg/dL
Hgb urine dipstick: NEGATIVE
KETONES UR: NEGATIVE mg/dL
Leukocytes, UA: NEGATIVE
Nitrite: NEGATIVE
PROTEIN: 30 mg/dL — AB
SPECIFIC GRAVITY, URINE: 1.023 (ref 1.005–1.030)
SQUAMOUS EPITHELIAL / LPF: NONE SEEN
pH: 5 (ref 5.0–8.0)

## 2017-05-22 LAB — AMMONIA: AMMONIA: 29 umol/L (ref 9–35)

## 2017-05-22 MED ORDER — FERROUS SULFATE 325 (65 FE) MG PO TABS
325.0000 mg | ORAL_TABLET | Freq: Every day | ORAL | Status: DC
  Administered 2017-05-23: 325 mg via ORAL
  Filled 2017-05-22: qty 1

## 2017-05-22 MED ORDER — DOCUSATE SODIUM 100 MG PO CAPS
100.0000 mg | ORAL_CAPSULE | Freq: Two times a day (BID) | ORAL | Status: DC
  Administered 2017-05-22 – 2017-05-23 (×2): 100 mg via ORAL
  Filled 2017-05-22 (×2): qty 1

## 2017-05-22 MED ORDER — LISINOPRIL 20 MG PO TABS
40.0000 mg | ORAL_TABLET | Freq: Two times a day (BID) | ORAL | Status: DC
Start: 2017-05-22 — End: 2017-05-22

## 2017-05-22 MED ORDER — ASPIRIN EC 81 MG PO TBEC
81.0000 mg | DELAYED_RELEASE_TABLET | Freq: Every day | ORAL | Status: DC
  Administered 2017-05-23: 81 mg via ORAL
  Filled 2017-05-22: qty 1

## 2017-05-22 MED ORDER — SODIUM CHLORIDE 0.9% FLUSH
3.0000 mL | INTRAVENOUS | Status: DC | PRN
Start: 2017-05-22 — End: 2017-05-23

## 2017-05-22 MED ORDER — OMEGA-3-ACID ETHYL ESTERS 1 G PO CAPS
2.0000 g | ORAL_CAPSULE | Freq: Every day | ORAL | Status: DC
  Administered 2017-05-23: 2 g via ORAL
  Filled 2017-05-22: qty 2

## 2017-05-22 MED ORDER — ACETAMINOPHEN 650 MG RE SUPP: 650.0000 mg | Freq: Four times a day (QID) | RECTAL | Status: DC | PRN

## 2017-05-22 MED ORDER — POLYETHYLENE GLYCOL 3350 17 G PO PACK: 17.0000 g | PACK | Freq: Every day | ORAL | Status: DC | PRN

## 2017-05-22 MED ORDER — AMLODIPINE BESYLATE 10 MG PO TABS
10.0000 mg | ORAL_TABLET | Freq: Every day | ORAL | Status: DC
  Administered 2017-05-22: 10 mg via ORAL
  Filled 2017-05-22: qty 1

## 2017-05-22 MED ORDER — LISINOPRIL 20 MG PO TABS
40.0000 mg | ORAL_TABLET | Freq: Two times a day (BID) | ORAL | Status: DC
  Administered 2017-05-22 – 2017-05-23 (×2): 40 mg via ORAL
  Filled 2017-05-22 (×2): qty 2

## 2017-05-22 MED ORDER — ADULT MULTIVITAMIN W/MINERALS CH
1.0000 | ORAL_TABLET | Freq: Every day | ORAL | Status: DC
  Administered 2017-05-23: 1 via ORAL
  Filled 2017-05-22: qty 1

## 2017-05-22 MED ORDER — TIMOLOL MALEATE 0.5 % OP SOLN
1.0000 [drp] | Freq: Every day | OPHTHALMIC | Status: DC
  Administered 2017-05-23: 1 [drp] via OPHTHALMIC
  Filled 2017-05-22: qty 5

## 2017-05-22 MED ORDER — METOPROLOL SUCCINATE ER 50 MG PO TB24: 50.0000 mg | ORAL_TABLET | Freq: Two times a day (BID) | ORAL | Status: DC

## 2017-05-22 MED ORDER — ENOXAPARIN SODIUM 40 MG/0.4ML ~~LOC~~ SOLN
40.0000 mg | SUBCUTANEOUS | Status: DC
  Administered 2017-05-22: 40 mg via SUBCUTANEOUS
  Filled 2017-05-22: qty 0.4

## 2017-05-22 MED ORDER — ONDANSETRON HCL 4 MG PO TABS: 4.0000 mg | ORAL_TABLET | Freq: Four times a day (QID) | ORAL | Status: DC | PRN

## 2017-05-22 MED ORDER — ONDANSETRON HCL 4 MG/2ML IJ SOLN: 4.0000 mg | Freq: Four times a day (QID) | INTRAMUSCULAR | Status: DC | PRN

## 2017-05-22 MED ORDER — METOPROLOL SUCCINATE ER 50 MG PO TB24
50.0000 mg | ORAL_TABLET | Freq: Two times a day (BID) | ORAL | Status: DC
  Administered 2017-05-22 – 2017-05-23 (×2): 50 mg via ORAL
  Filled 2017-05-22 (×2): qty 1

## 2017-05-22 MED ORDER — HYDROCODONE-ACETAMINOPHEN 5-325 MG PO TABS: 1.0000 | ORAL_TABLET | ORAL | Status: DC | PRN

## 2017-05-22 MED ORDER — SIMVASTATIN 20 MG PO TABS
40.0000 mg | ORAL_TABLET | Freq: Every day | ORAL | Status: DC
  Administered 2017-05-22: 40 mg via ORAL
  Filled 2017-05-22: qty 2

## 2017-05-22 MED ORDER — ARIPIPRAZOLE 15 MG PO TABS
30.0000 mg | ORAL_TABLET | Freq: Two times a day (BID) | ORAL | Status: DC
  Administered 2017-05-22 – 2017-05-23 (×2): 30 mg via ORAL
  Filled 2017-05-22 (×2): qty 2

## 2017-05-22 MED ORDER — ACETAMINOPHEN 325 MG PO TABS: 650.0000 mg | ORAL_TABLET | Freq: Four times a day (QID) | ORAL | Status: DC | PRN

## 2017-05-22 MED ORDER — LATANOPROST 0.005 % OP SOLN
1.0000 [drp] | Freq: Two times a day (BID) | OPHTHALMIC | Status: DC
  Administered 2017-05-22: 1 [drp] via OPHTHALMIC
  Filled 2017-05-22: qty 2.5

## 2017-05-22 MED ORDER — SODIUM CHLORIDE 0.9% FLUSH
3.0000 mL | Freq: Two times a day (BID) | INTRAVENOUS | Status: DC
  Administered 2017-05-23: 3 mL via INTRAVENOUS

## 2017-05-22 MED ORDER — QUETIAPINE FUMARATE 300 MG PO TABS
600.0000 mg | ORAL_TABLET | Freq: Every day | ORAL | Status: DC
  Administered 2017-05-22: 600 mg via ORAL
  Filled 2017-05-22 (×2): qty 2

## 2017-05-22 MED ORDER — TEMAZEPAM 15 MG PO CAPS
15.0000 mg | ORAL_CAPSULE | Freq: Every evening | ORAL | Status: DC | PRN
  Administered 2017-05-22: 15 mg via ORAL
  Filled 2017-05-22: qty 1

## 2017-05-22 MED ORDER — CARBAMAZEPINE 200 MG PO TABS
600.0000 mg | ORAL_TABLET | Freq: Two times a day (BID) | ORAL | Status: DC
  Administered 2017-05-22 – 2017-05-23 (×2): 600 mg via ORAL
  Filled 2017-05-22 (×3): qty 3

## 2017-05-22 MED ORDER — SODIUM CHLORIDE 0.9 % IV SOLN: 250.0000 mL | INTRAVENOUS | Status: DC | PRN

## 2017-05-22 MED ORDER — AMLODIPINE BESYLATE 10 MG PO TABS: 10.0000 mg | ORAL_TABLET | Freq: Every day | ORAL | Status: DC

## 2017-05-22 MED ORDER — SIMVASTATIN 20 MG PO TABS: 40.0000 mg | ORAL_TABLET | Freq: Every day | ORAL | Status: DC

## 2017-05-22 NOTE — ED Notes (Signed)
Xray at bedside. Pt given urinal.

## 2017-05-22 NOTE — ED Provider Notes (Signed)
[facility] Emergency Department Provider Note   ____________________________________________   First MD Initiated Contact with Patient 05/22/17 1205     (approximate)  I have reviewed the triage vital signs and the nursing notes.   HISTORY  Chief Complaint Dizziness    HPI Douglas Perkins. is a 72 y.o. male Patient reports he was sitting at a restaurant when he got weak and was seeing spots in front of his eyes and felt like he was given a pass out. He did not have any vertigo. He did not have any chest pain shortness of breath neck pain A pain anywhere. He is feeling better now. He is not diabetic.it does resolve spontaneously. He has a pacemaker in.   Past Medical History:  Diagnosis Date  . Bipolar 1 disorder ([place])   . Cancer (HCC)    merkel cell cancer  . Cancer [facility])    prostate  . Glaucoma   . Hypertension   . 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 Presence of permanent cardiac pacemaker   . TIA (transient ischemic attack)     Patient Active Problem List   Diagnosis Date Noted  . Syncope 07/19/2015  . Sepsis ([place]) 07/07/2015  . Bipolar I disorder ([place]) 06/24/2015  . Bilateral pneumonia 06/23/2015  . 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 Bradycardia 02/08/2015  . 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 AF (amaurosis fugax) 09/16/2014  . Benign essential HTN 09/16/2014  . Chest pain 09/16/2014  . Skin cyst 08/06/2012  . Personal history of lymphoma 08/06/2012  . Cancer [facility])     Past Surgical History:  Procedure Laterality Date  . CHOLECYSTECTOMY    . COLONOSCOPY WITH PROPOFOL N/A 01/31/2017   Procedure: COLONOSCOPY WITH PROPOFOL;  Surgeon: Manya Silvas, MD;  Location: [facility] ENDOSCOPY;  Service: Endoscopy;  Laterality: N/A;  . LEG SURGERY Left    distal  . Pace maker placement    . PROSTATE SURGERY    . PROSTATECTOMY    . SKIN CANCER EXCISION  2006,2007   Merkle Cell Carcinoma    Prior to Admission medications   Medication Sig Start Date End Date Taking? Authorizing Provider  amLODipine (NORVASC) 10 MG tablet Take by mouth daily.  01/10/17  Yes [provider]  ARIPiprazole (ABILIFY) 10 MG tablet Take 30 mg by mouth 2 (two) times daily. 10mg -AM/20mg -PM 01/29/17  Yes [provider]  aspirin EC 81 MG tablet Take 81 mg by mouth daily. Reported on 08/18/2015   Yes [provider]  carbamazepine (TEGRETOL) 200 MG tablet Take 600 mg by mouth 2 (two) times daily.   Yes [provider]  ferrous sulfate 325 (65 FE) MG tablet Take 325 mg by mouth daily.    Yes [provider]  gabapentin (NEURONTIN) 100 MG capsule Take 2 capsules by mouth 2 (two) times daily. 03/16/17  Yes [provider]  glucosamine-chondroitin 500-400 MG tablet Take 1 tablet by mouth daily.   Yes [provider]  latanoprost (XALATAN) 0.005 % ophthalmic solution Place 1 drop into both eyes 2 (two) times daily.    Yes [provider]  lisinopril (PRINIVIL,ZESTRIL) 40 MG tablet Take 40 mg by mouth 2 (two) times daily.    Yes [provider]  metoprolol succinate (TOPROL-XL) 50 MG 24 hr tablet Take 50 mg by mouth 2 (two) times daily. Take with or immediately following a meal.   Yes  [provider]  Multiple Vitamin (MULTIVITAMIN WITH MINERALS) TABS tablet Take 1 tablet by mouth daily.   Yes [provider]  Omega-3 Fatty Acids (FISH OIL) 1000 MG CAPS Take 1,000 mg by mouth 2 (two) times daily.    Yes [provider]  QUEtiapine (SEROQUEL) 300 MG tablet Take 600 mg by mouth at bedtime.   Yes [provider]  simvastatin (ZOCOR) 40 MG tablet Take 40 mg by mouth daily at 6 PM.   Yes [provider]  temazepam (RESTORIL) 15 MG capsule Take 15 mg by mouth at bedtime as needed for sleep.   Yes [provider]  timolol (BETIMOL) 0.5 % ophthalmic solution Place 1 drop  into the right eye daily.   Yes [provider]  acetaminophen (TYLENOL) 325 MG tablet Take 2 tablets (650 mg total) by mouth every 6 (six) hours as needed for mild pain or moderate pain. Patient not taking: Reported on 02/08/2017 07/12/15   Nicholes Mango, MD  atorvastatin (LIPITOR) 20 MG tablet Take 1 tablet (20 mg total) by mouth daily at 6 PM. Patient not taking: Reported on 02/08/2017 06/27/15   Henreitta Leber, MD    Allergies Other; Feldene [piroxicam]; and Sulfa antibiotics  Family History  Problem Relation Age of Onset  . Stroke Father   . CAD Paternal Grandmother   . CAD Paternal Grandfather     Social History Social History   Tobacco Use  . Smoking status: Former Research scientist (life sciences)  . Smokeless tobacco: Never Used  Substance Use Topics  . Alcohol use: No    Alcohol/week: 0.0 oz  . Drug use: No    Review of Systems  Constitutional: No fever/chills Eyes: No visual changes. ENT: No sore throat. Cardiovascular: Denies chest pain. Respiratory: Denies shortness of breath. Gastrointestinal: No abdominal pain.  No nausea, no vomiting.  No diarrhea.  No constipation. Genitourinary: Negative for dysuria. Musculoskeletal: Negative for back pain. Skin: Negative for rash. Neurological: Negative for headaches, focal weakness   ____________________________________________   PHYSICAL EXAM:  VITAL SIGNS: ED Triage Vitals  Enc Vitals Group     BP 05/22/17 0915 (!) 151/64     Pulse Rate 05/22/17 0915 60     Resp 05/22/17 0915 16     Temp 05/22/17 0917 (!) 97.5 F (36.4 C)     Temp Source 05/22/17 0917 Oral     SpO2 05/22/17 0915 99 %     Weight 05/22/17 0916 245 lb (111.1 kg)     Height 05/22/17 0916 6' (1.829 m)     Head Circumference --      Peak Flow --      Pain Score --      Pain Loc --      Pain Edu? --      Excl. in [place]? --     Constitutional: Alert and oriented. Well appearing and in no acute distress. Eyes: Conjunctivae are normal. Head:  Atraumatic. Nose: No congestion/rhinnorhea. Mouth/Throat: Mucous membranes are moist.  Oropharynx non-erythematous. Neck: No stridor. Cardiovascular: Normal rate, regular rhythm. Grossly normal heart sounds.  Good peripheral circulation. Respiratory: Normal respiratory effort.  No retractions. Lungs CTAB. Gastrointestinal: Soft and nontender. No distention. No abdominal bruits. No CVA tenderness. Musculoskeletal: No lower extremity tenderness nor edema.  No joint effusions. Neurologic:  Normal speech and language. No gross focal neurologic deficits are appreciated. No gait instability. Skin:  Skin is warm, dry and intact. No rash noted. Psychiatric: Mood and affect are normal. Speech and  behavior are normal.  ____________________________________________   LABS (all labs ordered are listed, but only abnormal results are displayed)  Labs Reviewed  BASIC METABOLIC PANEL - Abnormal; Notable for the following components:      Result Value   Chloride 100 (*)    Glucose, Bld 130 (*)    All other components within normal limits  CBC - Abnormal; Notable for the following components:   RBC 4.34 (*)    All other components within normal limits  URINALYSIS, COMPLETE (UACMP) WITH MICROSCOPIC - Abnormal; Notable for the following components:   Color, Urine AMBER (*)    APPearance HAZY (*)    Protein, ur 30 (*)    All other components within normal limits  TROPONIN I  TROPONIN I  CBG MONITORING, ED   ____________________________________________  EKG  EKG read and interpreted by me shows atrial paced rhythm at a rate of 60 left axis right bundle branch block appears also reading anterior hemiblock there are flipped T waves in III and F which are new from previous EKGs. ____________________________________________  RADIOLOGY    ____________________________________________   PROCEDURES  Procedure(s) performed:   Procedures  Critical Care performed:   ____________________________________________   INITIAL IMPRESSION / ASSESSMENT AND PLAN / ED COURSE      Clinical Course as of May 23 1347  Tue May 22, 2017  1208 Chloride: (!) 100 [PM]    Clinical Course User Index [PM] Nena Polio, MD     ____________________________________________   FINAL CLINICAL IMPRESSION(S) / ED DIAGNOSES  patient's EKG from today shows artery flipped T's in III and F which are new from previously his troponins are negative but with a near syncope and EKG changes I will discuss the patient with the hospitalist. Patient's pacemaker was interrogated and was found to be normal with his near syncope and the EKG changes I think the safest thing to do would watch him in the hospital overnight     Final diagnoses:  Near syncope  Abnormal EKG     ED Discharge Orders    None       Note:  This document was prepared using Dragon voice recognition software and may include unintentional dictation errors.    Nena Polio, MD 05/22/17 1349

## 2017-05-22 NOTE — Progress Notes (Signed)
Unable to do full skin assessment on patient.  He is refusing to take off shoes and pants at this time.

## 2017-05-22 NOTE — Plan of Care (Signed)
  Progressing Education: Knowledge of General Education information will improve 05/22/2017 2253 - Progressing by Loran Senters, RN Safety: Ability to remain free from injury will improve 05/22/2017 2253 - Progressing by Loran Senters, RN

## 2017-05-22 NOTE — H&P (Signed)
[facility] [place] NAME: Douglas Perkins    MR#:  [id number]  DATE OF BIRTH:  07-20-1945  DATE OF ADMISSION:  05/22/2017  PRIMARY CARE PHYSICIAN: [name], MD   REQUESTING/REFERRING PHYSICIAN:   CHIEF COMPLAINT:   Chief Complaint  Patient presents with  . Dizziness    HISTORY OF PRESENT ILLNESS: Douglas Perkins  is a 72 y.o. 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  PAST MEDICAL HISTORY:   Past Medical History:  Diagnosis Date  . Bipolar 1 disorder ([place])   . Cancer (HCC)    merkel cell cancer  . Cancer [facility])    prostate  . Glaucoma   . Hypertension   . 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 Presence of permanent cardiac pacemaker   . TIA (transient ischemic attack)     PAST SURGICAL HISTORY:  Past Surgical History:  Procedure Laterality Date  . CHOLECYSTECTOMY    . COLONOSCOPY WITH PROPOFOL N/A 01/31/2017   Procedure: COLONOSCOPY WITH PROPOFOL;  Surgeon: Manya Silvas, MD;  Location: [facility] ENDOSCOPY;  Service: Endoscopy;  Laterality: N/A;  . LEG SURGERY Left    distal  . Pace maker placement    . PROSTATE SURGERY    . PROSTATECTOMY    . SKIN CANCER EXCISION  2006,2007   Merkle Cell Carcinoma    SOCIAL HISTORY:  Social History   Tobacco Use  . Smoking status: Former Research scientist (life sciences)  . Smokeless tobacco:  Never Used  Substance Use Topics  . Alcohol use: No    Alcohol/week: 0.0 oz    FAMILY HISTORY:  Family History  Problem Relation Age of Onset  . Stroke Father   . CAD Paternal Grandmother   . CAD Paternal Grandfather     DRUG ALLERGIES:  Allergies  Allergen Reactions  . Other Hives, Shortness Of Breath and Other (See Comments)    Pt states that he is allergic to unwashed blood products.    . Feldene [Piroxicam] Hives  . Sulfa Antibiotics Hives    REVIEW OF SYSTEMS:   CONSTITUTIONAL: No fever, fatigue or weakness.  EYES: No blurred or double vision.  EARS, NOSE, AND THROAT: No tinnitus or ear pain.  RESPIRATORY: No cough, shortness of breath, wheezing or hemoptysis.  CARDIOVASCULAR: No chest pain, orthopnea, edema.  GASTROINTESTINAL: No nausea, vomiting, diarrhea or abdominal pain.  GENITOURINARY: No dysuria, hematuria.  ENDOCRINE: No polyuria, nocturia,  HEMATOLOGY: No anemia, easy bruising or bleeding SKIN: No rash or lesion. MUSCULOSKELETAL: No joint pain or arthritis.   NEUROLOGIC: No tingling, numbness, weakness.  PSYCHIATRY: No anxiety or depression.   MEDICATIONS AT HOME:  Prior to Admission medications   Medication Sig Start Date End Date Taking? Authorizing Provider  amLODipine (NORVASC) 10 MG tablet Take by mouth daily.  01/10/17  Yes [provider]  ARIPiprazole (ABILIFY) 10 MG tablet Take 30 mg by mouth 2 (two) times daily. 10mg -AM/20mg -PM 01/29/17  Yes [provider]  aspirin  EC 81 MG tablet Take 81 mg by mouth daily. Reported on 08/18/2015   Yes [provider]  carbamazepine (TEGRETOL) 200 MG tablet Take 600 mg by mouth 2 (two) times daily.   Yes [provider]  ferrous sulfate 325 (65 FE) MG tablet Take 325 mg by mouth daily.    Yes [provider]  gabapentin (NEURONTIN) 100 MG capsule Take 2 capsules by mouth 2 (two) times daily. 03/16/17  Yes [provider]  glucosamine-chondroitin 500-400 MG  tablet Take 1 tablet by mouth daily.   Yes [provider]  latanoprost (XALATAN) 0.005 % ophthalmic solution Place 1 drop into both eyes 2 (two) times daily.    Yes [provider]  lisinopril (PRINIVIL,ZESTRIL) 40 MG tablet Take 40 mg by mouth 2 (two) times daily.    Yes [provider]  metoprolol succinate (TOPROL-XL) 50 MG 24 hr tablet Take 50 mg by mouth 2 (two) times daily. Take with or immediately following a meal.   Yes [provider]  Multiple Vitamin (MULTIVITAMIN WITH MINERALS) TABS tablet Take 1 tablet by mouth daily.   Yes [provider]  Omega-3 Fatty Acids (FISH OIL) 1000 MG CAPS Take 1,000 mg by mouth 2 (two) times daily.    Yes [provider]  QUEtiapine (SEROQUEL) 300 MG tablet Take 600 mg by mouth at bedtime.   Yes [provider]  simvastatin (ZOCOR) 40 MG tablet Take 40 mg by mouth daily at 6 PM.   Yes [provider]  temazepam (RESTORIL) 15 MG capsule Take 15 mg by mouth at bedtime as needed for sleep.   Yes [provider]  timolol (BETIMOL) 0.5 % ophthalmic solution Place 1 drop into the right eye daily.   Yes [provider]      PHYSICAL EXAMINATION:   VITAL SIGNS: Blood pressure 132/63, pulse 60, temperature (!) 97.5 F (36.4 C), temperature source Oral, resp. rate 15, height 6' (1.829 m), weight 111.1 kg (245 lb), SpO2 100 %.  GENERAL:  72 y.o.-year-old patient lying in the bed with no acute distress.  Obese, nontoxic-appearing EYES: Pupils equal, round, reactive to light and accommodation. No scleral icterus. Extraocular muscles intact.  HEENT: Head atraumatic, normocephalic. Oropharynx and nasopharynx clear.  NECK:  Supple, no jugular venous distention. No thyroid enlargement, no tenderness.  LUNGS: Normal breath sounds bilaterally, no wheezing, rales,rhonchi or crepitation. No use of accessory muscles of respiration.  CARDIOVASCULAR: S1, S2 normal. No murmurs, rubs, or  gallops.  ABDOMEN: Soft, nontender, nondistended. Bowel sounds present. No organomegaly or mass.  EXTREMITIES: No pedal edema, cyanosis, or clubbing.  NEUROLOGIC: Cranial nerves II through XII are intact. Muscle strength 5/5 in all extremities. Sensation intact. Gait not checked.  PSYCHIATRIC: The patient is alert and oriented x 3.  SKIN: No obvious rash, lesion, or ulcer.   LABORATORY PANEL:   CBC Recent Labs  Lab 05/22/17 0917  WBC 5.3  HGB 14.4  HCT 41.4  PLT 267  MCV 95.4  MCH 33.1  MCHC 34.7  RDW 13.5   ------------------------------------------------------------------------------------------------------------------  Chemistries  Recent Labs  Lab 05/22/17 0917  NA 137  K 4.3  CL 100*  CO2 28  GLUCOSE 130*  BUN 13  CREATININE 0.75  CALCIUM 8.9   ------------------------------------------------------------------------------------------------------------------ estimated creatinine clearance is 105.9 mL/min (by C-G formula based on SCr of 0.75 mg/dL). ------------------------------------------------------------------------------------------------------------------ No results for input(s): TSH, T4TOTAL, T3FREE, THYROIDAB in the last 72 hours.  Invalid input(s): FREET3   Coagulation profile  No results for input(s): INR, PROTIME in the last 168 hours. ------------------------------------------------------------------------------------------------------------------- No results for input(s): DDIMER in the last 72 hours. -------------------------------------------------------------------------------------------------------------------  Cardiac Enzymes Recent Labs  Lab 05/22/17 0917 05/22/17 1226  TROPONINI <0.03 <0.03   ------------------------------------------------------------------------------------------------------------------ Invalid input(s):  POCBNP  ---------------------------------------------------------------------------------------------------------------  Urinalysis    Component Value Date/Time   COLORURINE AMBER (A) 05/22/2017 1226   APPEARANCEUR HAZY (A) 05/22/2017 1226   APPEARANCEUR Clear 05/24/2015 1328   LABSPEC 1.023 05/22/2017 1226   PHURINE 5.0 05/22/2017 1226   GLUCOSEU NEGATIVE 05/22/2017 1226   HGBUR NEGATIVE 05/22/2017 1226   BILIRUBINUR NEGATIVE 05/22/2017 1226   BILIRUBINUR Negative 05/24/2015 1328   KETONESUR NEGATIVE 05/22/2017 1226   PROTEINUR 30 (A) 05/22/2017 1226   NITRITE NEGATIVE 05/22/2017 1226   LEUKOCYTESUR NEGATIVE 05/22/2017 1226   LEUKOCYTESUR Negative 05/24/2015 1328     RADIOLOGY: Dg Chest Portable 1 View  Result Date: 05/22/2017 CLINICAL DATA:  Near syncopal episode. EXAM: PORTABLE CHEST 1 VIEW COMPARISON:  CTA of the chest 06/12/2016 FINDINGS: Heart size is normal. Pacing wires are stable. Aortic atherosclerosis is present. There is no edema or effusion. No focal airspace disease present. IMPRESSION: No active disease. Electronically Signed   By: San Morelle M.D.   On: 05/22/2017 12:51    EKG: Orders placed or performed during the hospital encounter of 05/22/17  . EKG 12-Lead  . EKG 12-Lead  . ED EKG  . ED EKG    IMPRESSION AND PLAN: 1 acute near syncope Referred to the observation unit, rule out acute coronary syndrome with cardiac enzymes x3 sets, neurochecks per routine, fall precautions, check orthostatics, carotid Dopplers, check echocardiogram-Echo from 2017 noted for ejection fraction 40-45%/hypokinesis of inferior septal wall, check urine drug screen, ambulate with assistance, check ammonia level, hold Neurontin, and continue close medical monitoring  2 chronic bipolar illness Stable Continue home regiment  3 chronic benign essential hypertension Relatively stable Continue current regiment, PRN hydralazine IV for systolic blood pressure greater than  160, vitals per routine, make changes as per necessary  4 chronic hyperlipidemia unspecified Stable Continue statin therapy  Full code Condition stable Prognosis good Disposition home on tomorrow barring any complications  All the records are reviewed and case discussed with ED provider. Management plans discussed with the patient, family and they are in agreement.  CODE STATUS: Code Status History    Date Active Date Inactive Code Status Order ID Comments User Context   07/19/2015 14:12 07/20/2015 15:03 Full Code [id number]  Hillary Bow, MD ED   07/07/2015 09:39 07/12/2015 18:00 Full Code [id number]  Vaughan Basta, MD Inpatient   06/23/2015 22:42 06/27/2015 19:28 Full Code [id number]  Nicholes Mango, MD ED       TOTAL TIME TAKING CARE OF THIS PATIENT: 45 minutes.    Avel Peace Douglas Perkins M.D on 05/22/2017   Between 7am to 6pm - Pager - [phone]  After 6pm go to [email or web address] - password [email or web address] [facility]  Office  [phone]  CC: Primary care physician; [name], MD   Note: This dictation was prepared with Dragon dictation along with smaller phrase technology. Any transcriptional errors that result from this process are unintentional.

## 2017-05-22 NOTE — ED Triage Notes (Signed)
C/O sudden dizzy spell.  Onset 30 min PTA.  MAE equally and strong.  + CMS.  Speech clear.

## 2017-05-22 NOTE — ED Notes (Signed)
[employer] pacemaker report handed to Dr. Cinda Quest

## 2017-05-23 ENCOUNTER — Observation Stay: Payer: Medicare Other

## 2017-05-23 DIAGNOSIS — I951 Orthostatic hypotension: Secondary | ICD-10-CM | POA: Diagnosis not present

## 2017-05-23 LAB — ECHOCARDIOGRAM COMPLETE
Height: 72 in
WEIGHTICAEL: 3920 [oz_av]

## 2017-05-23 LAB — URINE DRUG SCREEN, QUALITATIVE (ARMC ONLY)
AMPHETAMINES, UR SCREEN: NOT DETECTED
BARBITURATES, UR SCREEN: NOT DETECTED
BENZODIAZEPINE, UR SCRN: POSITIVE — AB
Cannabinoid 50 Ng, Ur ~~LOC~~: NOT DETECTED
Cocaine Metabolite,Ur ~~LOC~~: NOT DETECTED
MDMA (Ecstasy)Ur Screen: NOT DETECTED
METHADONE SCREEN, URINE: NOT DETECTED
Opiate, Ur Screen: NOT DETECTED
Phencyclidine (PCP) Ur S: NOT DETECTED
TRICYCLIC, UR SCREEN: POSITIVE — AB

## 2017-05-23 LAB — TROPONIN I

## 2017-05-23 MED ORDER — ATORVASTATIN CALCIUM 20 MG PO TABS: 20.0000 mg | ORAL_TABLET | Freq: Every day | ORAL | Status: DC

## 2017-05-23 MED ORDER — ARIPIPRAZOLE 10 MG PO TABS: 10.0000 mg | ORAL_TABLET | Freq: Every day | ORAL | Status: DC

## 2017-05-23 MED ORDER — ARIPIPRAZOLE 10 MG PO TABS: 20.0000 mg | ORAL_TABLET | Freq: Every day | ORAL | Status: DC

## 2017-05-23 MED ORDER — HYDRALAZINE HCL 25 MG PO TABS: 25.0000 mg | ORAL_TABLET | Freq: Three times a day (TID) | ORAL | 0 refills | Status: DC

## 2017-05-23 MED ORDER — ATORVASTATIN CALCIUM 20 MG PO TABS: 20.0000 mg | ORAL_TABLET | Freq: Every day | ORAL | 0 refills | Status: DC

## 2017-05-23 NOTE — Discharge Summary (Signed)
Douglas Perkins., is a 72 y.o. male  DOB 1945/10/31  MRN [id number].  Admission date:  05/22/2017  Admitting Physician  Gorden Harms, MD  Discharge Date:  05/23/2017   Primary MD  [name], MD  Recommendations for primary care physician for things to follow:   Follow with PCP in 2 week Follow-up with Dr. Nehemiah Massed in 1 weeks   Admission Diagnosis  Abnormal EKG [R94.31] Near syncope [R55]   Discharge Diagnosis  Abnormal EKG [R94.31] Near syncope [R55]    Active Problems:   Near syncope      Past Medical History:  Diagnosis Date  . Bipolar 1 disorder ([place])   . Cancer (HCC)    merkel cell cancer  . Cancer [facility])    prostate  . Glaucoma   . Hypertension   . 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 Presence of permanent cardiac pacemaker   . TIA (transient ischemic attack)     Past Surgical History:  Procedure Laterality Date  . CHOLECYSTECTOMY    . COLONOSCOPY WITH PROPOFOL N/A 01/31/2017   Procedure: COLONOSCOPY WITH PROPOFOL;  Surgeon: Manya Silvas, MD;  Location: [facility] ENDOSCOPY;  Service: Endoscopy;  Laterality: N/A;  . LEG SURGERY Left    distal  . Pace maker placement    . PROSTATE SURGERY    . PROSTATECTOMY    . SKIN CANCER EXCISION  2006,2007   Merkle Cell Carcinoma       History of present illness and  Hospital Course:     Kindly see H&P for history of present illness and admission details, please review complete Labs, Consult reports and Test reports for all details in brief  HPI  from the history and physical done on the day of admission 72 year old male patient admitted for near syncope, dizziness yesterday.  Hospital Course  #1 .near syncope likely secondary to orthostatic hypotension.  EKG, troponins unremarkable.  Did not have any  erythema.  Ultrasound of carotids did not show any hemodynamically significant stenosis.  Patient echocardiogram also showed EF 55-65%.  Discussed with Dr. Ubaldo Glassing, patient can follow-up with Dr. Nehemiah Massed his primary cardiologist regarding evaluation and possible loop monitor evaluation.   He  had a pacemaker that was interrogated recently. #2 orthostatic hypotension with 20 mm pressure drop from sitting to standing.  Not on diuretics at home. 3.  Bipolar disorder: Patient is on Restoril 15 mg at bedtime as needed and also Seroquel.,  Tegretol, Abilify, Neurontin some of the orthostatic hypotension may be coming from his psychiatric medicines. 4.  Essential hypertension, ; uncontrolled, added hydralazine 25 mg 3 times daily to his BP medicines including Norvasc, lisinopril, metoprolol.   Discharge Condition: Stable  Follow UP  Follow-up with cardiology Dr. Nehemiah Massed in 1 week.   Discharge Instructions  and  Discharge Medications      Allergies as of 05/23/2017      Reactions   Other Hives, Shortness Of Breath, Other (See Comments)   Pt states that he is allergic to unwashed blood products.     Feldene [piroxicam] Hives   Sulfa Antibiotics Hives      Medication List    TAKE these medications   amLODipine 10 MG tablet Commonly known as:  NORVASC Take by mouth daily.   ARIPiprazole 10 MG tablet Commonly known as:  ABILIFY Take 30 mg by mouth 2 (two) times daily. 10mg -AM/20mg -PM   aspirin EC 81 MG tablet Take 81  mg by mouth daily. Reported on 08/18/2015   carbamazepine 200 MG tablet Commonly known as:  TEGRETOL Take 600 mg by mouth 2 (two) times daily.   ferrous sulfate 325 (65 FE) MG tablet Take 325 mg by mouth daily.   Fish Oil 1000 MG Caps Take 1,000 mg by mouth 2 (two) times daily.   gabapentin 100 MG capsule Commonly known as:  NEURONTIN Take 2 capsules by mouth 2 (two) times daily.   glucosamine-chondroitin 500-400 MG tablet Take 1 tablet by mouth daily.    latanoprost 0.005 % ophthalmic solution Commonly known as:  XALATAN Place 1 drop into both eyes 2 (two) times daily.   lisinopril 40 MG tablet Commonly known as:  PRINIVIL,ZESTRIL Take 40 mg by mouth 2 (two) times daily.   metoprolol succinate 50 MG 24 hr tablet Commonly known as:  TOPROL-XL Take 50 mg by mouth 2 (two) times daily. Take with or immediately following a meal.   multivitamin with minerals Tabs tablet Take 1 tablet by mouth daily.   QUEtiapine 300 MG tablet Commonly known as:  SEROQUEL Take 600 mg by mouth at bedtime.   simvastatin 40 MG tablet Commonly known as:  ZOCOR Take 40 mg by mouth daily at 6 PM.   temazepam 15 MG capsule Commonly known as:  RESTORIL Take 15 mg by mouth at bedtime as needed for sleep.   timolol 0.5 % ophthalmic solution Commonly known as:  BETIMOL Place 1 drop into the right eye daily.         Diet and Activity recommendation: See Discharge Instructions above   Consults obtained -none   Major procedures and Radiology Reports - PLEASE review detailed and final reports for all details, in brief -     US Carotid Bilateral  Result Date: 05/23/2017 CLINICAL DATA:  Near syncope EXAM: BILATERAL CAROTID DUPLEX ULTRASOUND TECHNIQUE: Pearline Cables scale imaging, color Doppler and duplex ultrasound were performed of bilateral carotid and vertebral arteries in the neck. COMPARISON:  10/05/2014 FINDINGS: Criteria: Quantification of carotid stenosis is based on velocity parameters that correlate the residual internal carotid diameter with NASCET-based stenosis levels, using the diameter of the distal internal carotid lumen as the denominator for stenosis measurement. The following velocity measurements were obtained: RIGHT ICA:  108 cm/sec CCA:  87 cm/sec SYSTOLIC ICA/CCA RATIO:  1.3 DIASTOLIC ICA/CCA RATIO:  1.5 ECA:  117 cm/sec LEFT ICA:  97 cm/sec CCA:  053 cm/sec SYSTOLIC ICA/CCA RATIO:  0.8 DIASTOLIC ICA/CCA RATIO:  1.1 ECA:  110 cm/sec RIGHT  CAROTID ARTERY: Mild calcified plaque in the bulb. Low resistance internal carotid Doppler pattern. RIGHT VERTEBRAL ARTERY:  Antegrade. LEFT CAROTID ARTERY: Minimal calcified plaque in the bulb. Low resistance internal carotid Doppler pattern. LEFT VERTEBRAL ARTERY:  Antegrade. IMPRESSION: Less than 50% stenosis in the right and left internal carotid arteries. Electronically Signed   By: Marybelle Killings M.D.   On: 05/23/2017 10:49   Dg Chest Portable 1 View  Result Date: 05/22/2017 CLINICAL DATA:  Near syncopal episode. EXAM: PORTABLE CHEST 1 VIEW COMPARISON:  CTA of the chest 06/12/2016 FINDINGS: Heart size is normal. Pacing wires are stable. Aortic atherosclerosis is present. There is no edema or effusion. No focal airspace disease present. IMPRESSION: No active disease. Electronically Signed   By: San Morelle M.D.   On: 05/22/2017 12:51    Micro Results    No results found for this or any previous visit (from the past 240 hour(s)).     Today   Subjective:  Douglas Perkins today has no headache,no chest abdominal pain,no new weakness tingling or numbness, feels much better wants to go home today.   Objective:   Blood pressure (!) 193/64, pulse 60, temperature (!) 97.4 F (36.3 C), temperature source Oral, resp. rate 18, height 6' (1.829 m), weight 111.1 kg (245 lb), SpO2 99 %.   Intake/Output Summary (Last 24 hours) at 05/23/2017 1315 Last data filed at 05/23/2017 1033 Gross per 24 hour  Intake 123 ml  Output 300 ml  Net -177 ml    Exam Awake Alert, Oriented x 3, No new F.N deficits, Normal affect [place].AT,PERRAL Supple Neck,No JVD, No cervical lymphadenopathy appriciated.  Symmetrical Chest wall movement, Good air movement bilaterally, CTAB RRR,No Gallops,Rubs or new Murmurs, No Parasternal Heave +ve B.Sounds, Abd Soft, Non tender, No organomegaly appriciated, No rebound -guarding or rigidity. No Cyanosis, Clubbing or edema, No new Rash or bruise  Data Review   CBC w  Diff:  Lab Results  Component Value Date   WBC 5.3 05/22/2017   HGB 14.4 05/22/2017   HGB 14.1 08/17/2014   HCT 41.4 05/22/2017   HCT 39.8 (L) 08/17/2014   PLT 267 05/22/2017   PLT 246 08/17/2014   LYMPHOPCT 34 02/08/2017   LYMPHOPCT 37.4 08/17/2014   MONOPCT 13 02/08/2017   MONOPCT 10.5 08/17/2014   EOSPCT 4 02/08/2017   EOSPCT 0.9 08/17/2014   BASOPCT 1 02/08/2017   BASOPCT 0.5 08/17/2014    CMP:  Lab Results  Component Value Date   NA 137 05/22/2017   NA 131 (L) 08/17/2014   K 4.3 05/22/2017   K 4.3 08/17/2014   CL 100 (L) 05/22/2017   CL 94 (L) 08/17/2014   CO2 28 05/22/2017   CO2 32 08/17/2014   BUN 13 05/22/2017   BUN 13 08/17/2014   CREATININE 0.75 05/22/2017   CREATININE 0.59 (L) 08/17/2014   PROT 6.6 02/08/2017   PROT 6.8 08/17/2014   ALBUMIN 3.6 02/08/2017   ALBUMIN 4.2 08/17/2014   BILITOT 0.5 02/08/2017   BILITOT 0.6 08/17/2014   ALKPHOS 73 02/08/2017   ALKPHOS 54 08/17/2014   AST 29 02/08/2017   AST 25 08/17/2014   ALT 32 02/08/2017   ALT 28 08/17/2014  .   Total Time in preparing paper work, data evaluation and todays exam - 35 minutes  Epifanio Lesches M.D on 05/23/2017 at 1:15 PM    Note: This dictation was prepared with Dragon dictation along with smaller phrase technology. Any transcriptional errors that result from this process are unintentional.

## 2017-05-23 NOTE — Care Management Obs Status (Signed)
[facility] NOTIFICATION   Patient Details  Name: Douglas Perkins. MRN: [id number] Date of Birth: August 04, 1945   Medicare Observation Status Notification Given:  Yes    Jolly Mango, RN 05/23/2017, 11:31 AM

## 2017-05-23 NOTE — Progress Notes (Signed)
Simvastatin changed to atorvastatin due to risk of rhabdo with amlodipine  Ameenah Prosser D Ayub Kirsh, Pharm.D, BCPS Clinical Pharmacist

## 2017-05-23 NOTE — Progress Notes (Signed)
Pt discharged this afternoon, VSS, no complaints at this time, follow up appointment information provided, and prescriptions called in and ready for pick up.

## 2017-08-13 NOTE — Progress Notes (Signed)
[place]  [phone] [phone]  ID: Douglas Perkins. 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  INTERVAL HISTORY: Patient returns to clinic today for repeat laboratory work and routine yearly evaluation.  He currently feels well and is asymptomatic.  He does not complain of weakness or fatigue today.  He has had no recent fevers or illnesses.  He has no neurologic complaints. He has a good appetite.  He denies any chest pain or cough.  He denies any nausea, vomiting, constipation, or diarrhea.  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  Patient offers no specific complaints today.  REVIEW OF SYSTEMS:   Review of Systems  Constitutional: Negative.  Negative for fever, malaise/fatigue and weight loss.  Respiratory: Negative.  Negative for cough and shortness of breath.   Cardiovascular: Negative.  Negative for chest pain and leg swelling.  Gastrointestinal: Negative.  Negative for abdominal pain, blood in stool, constipation, diarrhea, melena, nausea and vomiting.  Musculoskeletal: Positive for back pain.  Skin: Negative.  Negative for rash.  Neurological: Negative.  Negative for sensory change, focal weakness and weakness.  Psychiatric/Behavioral: Negative.  Negative for depression. The patient is not nervous/anxious.     As per HPI. Otherwise, a complete review of systems is negative.  PAST MEDICAL HISTORY: Past Medical History:  Diagnosis Date  . Bipolar 1 disorder ([place])   . Cancer (HCC)    merkel cell cancer  . Cancer [facility])    prostate  . Glaucoma   . Hypertension   . 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 Presence of permanent cardiac  pacemaker   . TIA (transient ischemic attack)     PAST SURGICAL HISTORY: Past Surgical History:  Procedure Laterality Date  . CHOLECYSTECTOMY    . COLONOSCOPY WITH PROPOFOL N/A 01/31/2017   Procedure: COLONOSCOPY WITH PROPOFOL;  Surgeon: Manya Silvas, MD;  Location: [facility] ENDOSCOPY;  Service: Endoscopy;  Laterality: N/A;  . LEG SURGERY Left    distal  . Pace maker placement    . PROSTATE SURGERY    . PROSTATECTOMY    . SKIN CANCER EXCISION  2006,2007   Merkle Cell Carcinoma    FAMILY HISTORY: Heart disease and bipolar disease.    ADVANCED DIRECTIVES:    HEALTH MAINTENANCE: Social History   Tobacco Use  . Smoking status: Former Research scientist (life sciences)  . Smokeless tobacco: Never Used  Substance Use Topics  . Alcohol use: No    Alcohol/week: 0.0 oz  . Drug use: No     Colonoscopy:  PAP:  Bone density:  Lipid panel:  Allergies  Allergen Reactions  . Other Hives, Shortness Of Breath and Other (See Comments)    Pt states that he is allergic to unwashed blood products.    . Feldene [Piroxicam] Hives  . Sulfa Antibiotics Hives    Current Outpatient Medications  Medication Sig Dispense Refill  . amLODipine (NORVASC) 10 MG tablet Take by mouth daily.   0  . ARIPiprazole (ABILIFY) 10 MG tablet Take 30 mg by mouth 2 (two) times daily. 10mg -AM/20mg -PM  0  . aspirin EC 81 MG tablet Take 81 mg by mouth daily. Reported on 08/18/2015    . carbamazepine (TEGRETOL)  200 MG tablet Take 600 mg by mouth 2 (two) times daily.    . ferrous sulfate 325 (65 FE) MG tablet Take 325 mg by mouth daily.     Marland Kitchen gabapentin (NEURONTIN) 100 MG capsule Take 2 capsules by mouth 2 (two) times daily.  0  . glucosamine-chondroitin 500-400 MG tablet Take 1 tablet by mouth daily.    . hydrALAZINE (APRESOLINE) 25 MG tablet Take 1 tablet (25 mg total) by mouth 3 (three) times daily. 90 tablet 0  . latanoprost (XALATAN) 0.005 % ophthalmic solution Place 1 drop into both eyes 2 (two) times daily.     Marland Kitchen lisinopril  (PRINIVIL,ZESTRIL) 40 MG tablet Take 40 mg by mouth 2 (two) times daily.     . metoprolol succinate (TOPROL-XL) 50 MG 24 hr tablet Take 50 mg by mouth 2 (two) times daily. Take with or immediately following a meal.    . Multiple Vitamin (MULTIVITAMIN WITH MINERALS) TABS tablet Take 1 tablet by mouth daily.    . Omega-3 Fatty Acids (FISH OIL) 1000 MG CAPS Take 1,000 mg by mouth 2 (two) times daily.     . QUEtiapine (SEROQUEL) 300 MG tablet Take 600 mg by mouth at bedtime.    . simvastatin (ZOCOR) 40 MG tablet Take 40 mg by mouth daily at 6 PM.    . temazepam (RESTORIL) 15 MG capsule Take 15 mg by mouth at bedtime as needed for sleep.    Marland Kitchen timolol (BETIMOL) 0.5 % ophthalmic solution Place 1 drop into the right eye daily.     No current facility-administered medications for this visit.     OBJECTIVE: Vitals:   08/16/17 1341  BP: (!) 154/79  Pulse: 60  Resp: 18  Temp: 98.2 F (36.8 C)  SpO2: 97%     Body mass index is 34.53 kg/m.    ECOG FS:0 - Asymptomatic  General: Well-developed, well-nourished, no acute distress. Eyes: Pink conjunctiva, anicteric sclera. HEENT: Normocephalic, moist mucous membranes, clear oropharnyx. Lungs: Clear to auscultation bilaterally. Heart: Regular rate and rhythm. No rubs, murmurs, or gallops. Abdomen: Soft, nontender, nondistended. No organomegaly noted, normoactive bowel sounds. Musculoskeletal: Chronic edema with skin changes on right lower extremity. Neuro: Alert, answering all questions appropriately. Cranial nerves grossly intact. Skin: No rashes or petechiae noted. Psych: Normal affect. Lymphatics: No cervical, calvicular, axillary or inguinal LAD.   LAB RESULTS:  Lab Results  Component Value Date   NA 133 (L) 08/16/2017   K 3.9 08/16/2017   CL 101 08/16/2017   CO2 23 08/16/2017   GLUCOSE 140 (H) 08/16/2017   BUN 14 08/16/2017   CREATININE 0.53 (L) 08/16/2017   CALCIUM 8.7 (L) 08/16/2017   PROT 7.2 08/16/2017   ALBUMIN 4.2 08/16/2017     AST 31 08/16/2017   ALT 38 08/16/2017   ALKPHOS 72 08/16/2017   BILITOT 0.5 08/16/2017   GFRNONAA >60 08/16/2017   GFRAA >60 08/16/2017    Lab Results  Component Value Date   WBC 6.1 08/16/2017   NEUTROABS 3.4 08/16/2017   HGB 14.1 08/16/2017   HCT 39.6 (L) 08/16/2017   MCV 94.3 08/16/2017   PLT 283 08/16/2017     STUDIES: No results found.  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 Adjuvant chemoradiation therapy completed in July of 2007.  PLAN:    1. Merkel cell: No evidence of disease.  PET scan from November 2014 was negative for any recurrence, no further imaging is necessary.  All of his laboratory work continues  to be either negative or within normal limits including an AFP of 2.8.  No intervention is needed at this time.  Although patient is over 10 years out from his adjuvant treatment, he has requested extended follow-up therefore will return to clinic in 1 year for further evaluation.  2. Hypertension: Patient's blood pressure is moderately elevated today.  Continue treatment and evaluation per primary care.  3. Hyponatremia: Mild and relatively unchanged.  No intervention needed.   Approximately 20 minutes was spent in discussion of which greater than 50% was consultation.  Patient expressed understanding and was in agreement with this plan. He also understands that He can call clinic at any time with any questions, concerns, or complaints.    Lloyd Huger, MD   08/21/2017 3:53 PM

## 2017-08-16 ENCOUNTER — Other Ambulatory Visit: Payer: Self-pay

## 2017-08-16 ENCOUNTER — Encounter: Payer: Self-pay | Admitting: Oncology

## 2017-08-16 ENCOUNTER — Inpatient Hospital Stay: Payer: Medicare Other | Attending: Oncology

## 2017-08-16 ENCOUNTER — Inpatient Hospital Stay (HOSPITAL_BASED_OUTPATIENT_CLINIC_OR_DEPARTMENT_OTHER): Payer: Medicare Other | Admitting: Oncology

## 2017-08-16 VITALS — BP 154/79 | HR 60 | Temp 98.2°F | Resp 18 | Ht 72.0 in | Wt 254.6 lb

## 2017-08-16 DIAGNOSIS — Z87891 Personal history of nicotine dependence: Secondary | ICD-10-CM

## 2017-08-16 DIAGNOSIS — I10 Essential (primary) hypertension: Secondary | ICD-10-CM | POA: Diagnosis not present

## 2017-08-16 DIAGNOSIS — C4A9 Merkel cell carcinoma, unspecified: Secondary | ICD-10-CM

## 2017-08-16 DIAGNOSIS — Z85821 Personal history of Merkel cell carcinoma: Secondary | ICD-10-CM

## 2017-08-16 DIAGNOSIS — Z9221 Personal history of antineoplastic chemotherapy: Secondary | ICD-10-CM | POA: Insufficient documentation

## 2017-08-16 DIAGNOSIS — Z923 Personal history of irradiation: Secondary | ICD-10-CM

## 2017-08-16 DIAGNOSIS — E871 Hypo-osmolality and hyponatremia: Secondary | ICD-10-CM

## 2017-08-16 DIAGNOSIS — M549 Dorsalgia, unspecified: Secondary | ICD-10-CM | POA: Insufficient documentation

## 2017-08-16 LAB — COMPREHENSIVE METABOLIC PANEL
ALBUMIN: 4.2 g/dL (ref 3.5–5.0)
ALT: 38 U/L (ref 17–63)
AST: 31 U/L (ref 15–41)
Alkaline Phosphatase: 72 U/L (ref 38–126)
Anion gap: 9 (ref 5–15)
BUN: 14 mg/dL (ref 6–20)
CHLORIDE: 101 mmol/L (ref 101–111)
CO2: 23 mmol/L (ref 22–32)
Calcium: 8.7 mg/dL — ABNORMAL LOW (ref 8.9–10.3)
Creatinine, Ser: 0.53 mg/dL — ABNORMAL LOW (ref 0.61–1.24)
GFR calc Af Amer: >60 mL/min (ref >60)
GFR calc non Af Amer: >60 mL/min (ref >60)
GLUCOSE: 140 mg/dL — AB (ref 65–99)
POTASSIUM: 3.9 mmol/L (ref 3.5–5.1)
SODIUM: 133 mmol/L — AB (ref 135–145)
Total Bilirubin: 0.5 mg/dL (ref 0.3–1.2)
Total Protein: 7.2 g/dL (ref 6.5–8.1)

## 2017-08-16 LAB — CBC WITH DIFFERENTIAL/PLATELET
Basophils Absolute: 0.1 10*3/uL (ref 0–0.1)
Basophils Relative: 1 %
Eosinophils Absolute: 0.1 10*3/uL (ref 0–0.7)
Eosinophils Relative: 2 %
HEMATOCRIT: 39.6 % — AB (ref 40.0–52.0)
Hemoglobin: 14.1 g/dL (ref 13.0–18.0)
LYMPHS PCT: 32 %
Lymphs Abs: 1.9 10*3/uL (ref 1.0–3.6)
MCH: 33.6 pg (ref 26.0–34.0)
MCHC: 35.6 g/dL (ref 32.0–36.0)
MCV: 94.3 fL (ref 80.0–100.0)
MONO ABS: 0.6 10*3/uL (ref 0.2–1.0)
MONOS PCT: 10 %
NEUTROS ABS: 3.4 10*3/uL (ref 1.4–6.5)
Neutrophils Relative %: 55 %
Platelets: 283 10*3/uL (ref 150–440)
RBC: 4.2 MIL/uL — ABNORMAL LOW (ref 4.40–5.90)
RDW: 12.8 % (ref 11.5–14.5)
WBC: 6.1 10*3/uL (ref 3.8–10.6)

## 2017-08-16 NOTE — Progress Notes (Signed)
No new changes noted today 

## 2017-08-17 LAB — AFP TUMOR MARKER: AFP, SERUM, TUMOR MARKER: 2.8 ng/mL (ref 0.0–8.3)

## 2017-09-16 ENCOUNTER — Other Ambulatory Visit: Payer: Self-pay

## 2017-09-16 ENCOUNTER — Emergency Department
Admission: EM | Admit: 2017-09-16 | Discharge: 2017-09-17 | Disposition: A | Discharge status: Home / Self Care | Payer: Medicare Other | Attending: Emergency Medicine | Admitting: Emergency Medicine

## 2017-09-16 ENCOUNTER — Emergency Department: Payer: Medicare Other

## 2017-09-16 DIAGNOSIS — R410 Disorientation, unspecified: Secondary | ICD-10-CM | POA: Insufficient documentation

## 2017-09-16 DIAGNOSIS — Y92511 Restaurant or cafe as the place of occurrence of the external cause: Secondary | ICD-10-CM | POA: Diagnosis not present

## 2017-09-16 DIAGNOSIS — S0990XA Unspecified injury of head, initial encounter: Secondary | ICD-10-CM | POA: Diagnosis present

## 2017-09-16 DIAGNOSIS — Y998 Other external cause status: Secondary | ICD-10-CM | POA: Insufficient documentation

## 2017-09-16 DIAGNOSIS — I10 Essential (primary) hypertension: Secondary | ICD-10-CM | POA: Diagnosis not present

## 2017-09-16 DIAGNOSIS — S0181XA Laceration without foreign body of other part of head, initial encounter: Secondary | ICD-10-CM | POA: Diagnosis not present

## 2017-09-16 DIAGNOSIS — R4182 Altered mental status, unspecified: Secondary | ICD-10-CM | POA: Diagnosis not present

## 2017-09-16 DIAGNOSIS — S060X9A Concussion with loss of consciousness of unspecified duration, initial encounter: Secondary | ICD-10-CM | POA: Diagnosis not present

## 2017-09-16 DIAGNOSIS — Z87891 Personal history of nicotine dependence: Secondary | ICD-10-CM | POA: Diagnosis not present

## 2017-09-16 DIAGNOSIS — Z8546 Personal history of malignant neoplasm of prostate: Secondary | ICD-10-CM | POA: Insufficient documentation

## 2017-09-16 DIAGNOSIS — Z85821 Personal history of Merkel cell carcinoma: Secondary | ICD-10-CM | POA: Insufficient documentation

## 2017-09-16 DIAGNOSIS — Z8572 Personal history of non-Hodgkin lymphomas: Secondary | ICD-10-CM | POA: Insufficient documentation

## 2017-09-16 DIAGNOSIS — Y9301 Activity, walking, marching and hiking: Secondary | ICD-10-CM | POA: Diagnosis not present

## 2017-09-16 DIAGNOSIS — Z79899 Other long term (current) drug therapy: Secondary | ICD-10-CM | POA: Insufficient documentation

## 2017-09-16 DIAGNOSIS — Z7982 Long term (current) use of aspirin: Secondary | ICD-10-CM | POA: Insufficient documentation

## 2017-09-16 DIAGNOSIS — S9032XA Contusion of left foot, initial encounter: Secondary | ICD-10-CM | POA: Diagnosis not present

## 2017-09-16 DIAGNOSIS — Z950 Presence of cardiac pacemaker: Secondary | ICD-10-CM | POA: Diagnosis not present

## 2017-09-16 DIAGNOSIS — W01198A Fall on same level from slipping, tripping and stumbling with subsequent striking against other object, initial encounter: Secondary | ICD-10-CM | POA: Diagnosis not present

## 2017-09-16 DIAGNOSIS — Z8673 Personal history of transient ischemic attack (TIA), and cerebral infarction without residual deficits: Secondary | ICD-10-CM | POA: Insufficient documentation

## 2017-09-16 DIAGNOSIS — S0081XA Abrasion of other part of head, initial encounter: Secondary | ICD-10-CM

## 2017-09-16 LAB — ETHANOL

## 2017-09-16 LAB — COMPREHENSIVE METABOLIC PANEL
ALK PHOS: 54 U/L (ref 38–126)
ALT: 37 U/L (ref 17–63)
ANION GAP: 11 (ref 5–15)
AST: 35 U/L (ref 15–41)
Albumin: 3.8 g/dL (ref 3.5–5.0)
BUN: 34 mg/dL — ABNORMAL HIGH (ref 6–20)
CALCIUM: 7.9 mg/dL — AB (ref 8.9–10.3)
CO2: 23 mmol/L (ref 22–32)
Chloride: 92 mmol/L — ABNORMAL LOW (ref 101–111)
Creatinine, Ser: 0.99 mg/dL (ref 0.61–1.24)
Glucose, Bld: 114 mg/dL — ABNORMAL HIGH (ref 65–99)
Potassium: 3.5 mmol/L (ref 3.5–5.1)
SODIUM: 126 mmol/L — AB (ref 135–145)
Total Bilirubin: 0.7 mg/dL (ref 0.3–1.2)
Total Protein: 6.8 g/dL (ref 6.5–8.1)

## 2017-09-16 LAB — CBC
HCT: 33.7 % — ABNORMAL LOW (ref 40.0–52.0)
Hemoglobin: 12.3 g/dL — ABNORMAL LOW (ref 13.0–18.0)
MCH: 33.4 pg (ref 26.0–34.0)
MCHC: 36.6 g/dL — AB (ref 32.0–36.0)
MCV: 91.2 fL (ref 80.0–100.0)
PLATELETS: 257 10*3/uL (ref 150–440)
RBC: 3.69 MIL/uL — AB (ref 4.40–5.90)
RDW: 12.3 % (ref 11.5–14.5)
WBC: 7.6 10*3/uL (ref 3.8–10.6)

## 2017-09-16 LAB — PROTIME-INR
INR: 0.93
PROTHROMBIN TIME: 12.4 s (ref 11.4–15.2)

## 2017-09-16 MED ORDER — SODIUM CHLORIDE 0.9 % IV BOLUS
1000.0000 mL | Freq: Once | INTRAVENOUS | Status: AC
  Administered 2017-09-16: 1000 mL via INTRAVENOUS

## 2017-09-16 NOTE — ED Notes (Signed)
Pt in ct - triage incomplete at this time.

## 2017-09-16 NOTE — ED Provider Notes (Signed)
[facility] Emergency Department Provider Note __________________   First MD Initiated Contact with Patient 09/16/17 2258     (approximate)  I have reviewed the triage vital signs and the nursing notes.  Level 5 caveat: History initially obtained from EMS secondary to patient's fusion. HISTORY  Chief Complaint Fall and Altered Mental Status    HPI Douglas Perkins. is a 72 y.o. male presents to the emergency department via EMS following accidental trip and fall with head injury.  Patient states he tripped on the curb outside of [place] with resultant injury to his face and hand.  EMS states that the patient has had increasing confusion and somnolent since the event.   Past Medical History:  Diagnosis Date  . Bipolar 1 disorder ([place])   . Cancer (HCC)    merkel cell cancer  . Cancer [facility])    prostate  . Glaucoma   . Hypertension   . 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 Presence of permanent cardiac pacemaker   . TIA (transient ischemic attack)     Patient Active Problem List   Diagnosis Date Noted  . Near syncope 05/22/2017  . Syncope 07/19/2015  . Sepsis ([place]) 07/07/2015  . Bipolar I disorder ([place]) 06/24/2015  . Bilateral pneumonia 06/23/2015  . 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 Bradycardia 02/08/2015  . 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 AF (amaurosis fugax) 09/16/2014  . Benign essential HTN 09/16/2014  . Chest pain 09/16/2014  . Skin cyst 08/06/2012  . Personal history of lymphoma 08/06/2012  . Cancer [facility])     Past Surgical History:  Procedure Laterality Date  . CHOLECYSTECTOMY    . COLONOSCOPY WITH PROPOFOL N/A 01/31/2017   Procedure: COLONOSCOPY WITH PROPOFOL;  Surgeon: Manya Silvas, MD;  Location: [facility] ENDOSCOPY;  Service: Endoscopy;  Laterality: N/A;  . LEG SURGERY Left    distal  . Pace maker placement    . PROSTATE SURGERY    . PROSTATECTOMY      . SKIN CANCER EXCISION  2006,2007   Merkle Cell Carcinoma    Prior to Admission medications   Medication Sig Start Date End Date Taking? Authorizing Provider  amLODipine (NORVASC) 10 MG tablet Take by mouth daily.  01/10/17   [provider]  ARIPiprazole (ABILIFY) 10 MG tablet Take 30 mg by mouth 2 (two) times daily. 10mg -AM/20mg -PM 01/29/17   [provider]  aspirin EC 81 MG tablet Take 81 mg by mouth daily. Reported on 08/18/2015    [provider]  carbamazepine (TEGRETOL) 200 MG tablet Take 600 mg by mouth 2 (two) times daily.    [provider]  ferrous sulfate 325 (65 FE) MG tablet Take 325 mg by mouth daily.     [provider]  gabapentin (NEURONTIN) 100 MG capsule Take 2 capsules by mouth 2 (two) times daily. 03/16/17   [provider]  glucosamine-chondroitin 500-400 MG tablet Take 1 tablet by mouth daily.    [provider]  hydrALAZINE (APRESOLINE) 25 MG tablet Take 1 tablet (25 mg total) by mouth 3 (three) times daily. 05/23/17 05/23/18  Epifanio Lesches, MD  latanoprost (XALATAN) 0.005 % ophthalmic solution Place 1 drop into both eyes 2 (two) times daily.     [provider]  lisinopril (PRINIVIL,ZESTRIL) 40 MG tablet Take 40 mg by mouth 2 (two) times daily.     [provider]  metoprolol succinate (  TOPROL-XL) 50 MG 24 hr tablet Take 50 mg by mouth 2 (two) times daily. Take with or immediately following a meal.    [provider]  Multiple Vitamin (MULTIVITAMIN WITH MINERALS) TABS tablet Take 1 tablet by mouth daily.    [provider]  Omega-3 Fatty Acids (FISH OIL) 1000 MG CAPS Take 1,000 mg by mouth 2 (two) times daily.     [provider]  QUEtiapine (SEROQUEL) 300 MG tablet Take 600 mg by mouth at bedtime.    [provider]  simvastatin (ZOCOR) 40 MG tablet Take 40 mg by mouth daily at 6 PM.    [provider]  temazepam (RESTORIL) 15 MG capsule  Take 15 mg by mouth at bedtime as needed for sleep.    [provider]  timolol (BETIMOL) 0.5 % ophthalmic solution Place 1 drop into the right eye daily.    [provider]    Allergies Other; Feldene [piroxicam]; and Sulfa antibiotics  Family History  Problem Relation Age of Onset  . Stroke Father   . CAD Paternal Grandmother   . CAD Paternal Grandfather     Social History Social History   Tobacco Use  . Smoking status: Former Research scientist (life sciences)  . Smokeless tobacco: Never Used  Substance Use Topics  . Alcohol use: No    Alcohol/week: 0.0 oz  . Drug use: No    Review of Systems Constitutional: No fever/chills Eyes: No visual changes. ENT: No sore throat. Cardiovascular: Denies chest pain. Respiratory: Denies shortness of breath. Gastrointestinal: No abdominal pain.  No nausea, no vomiting.  No diarrhea.  No constipation. Genitourinary: Negative for dysuria. Musculoskeletal: Negative for neck pain.  Negative for back pain.  Positive for right hand and bilateral foot pain Integumentary: Negative for rash.  For facial abrasions Neurological: Negative for headaches, focal weakness or numbness.   ____________________________________________   PHYSICAL EXAM:  VITAL SIGNS: ED Triage Vitals [09/16/17 2258]  Enc Vitals Group     BP (!) 80/50     Pulse Rate 60     Resp 16     Temp 97.6 F (36.4 C)     Temp src      SpO2 99 %     Weight      Height      Head Circumference      Peak Flow      Pain Score      Pain Loc      Pain Edu?      Excl. in [place]?     Constitutional: Alert but somnolent confused.  Eyes: Conjunctivae are normal. PERRL. EOMI. Head: 1 cm linear laceration nasal bridge.  Facial abrasions inferior to the nose superior to the upper lip.. Mouth/Throat: Mucous membranes are moist.  Oropharynx non-erythematous. Neck: No stridor.  No cervical spine tenderness to palpation. Cardiovascular: Normal rate, regular rhythm. Good peripheral  circulation. Grossly normal heart sounds. Respiratory: Normal respiratory effort.  No retractions. Lungs CTAB. Gastrointestinal: Soft and nontender. No distention.  Musculoskeletal: Right ring finger swelling and ecchymoses noted.  Bilateral feet contusion with market ecchymosis noted to the medial aspect of the left great toe Neurologic: Somnolent no gross focal neurologic deficits are appreciated.  Skin:  Skin is warm, dry and intact. No rash noted. Psychiatric: Mood and affect are normal. Speech and behavior are normal.  ____________________________________________   LABS (all labs ordered are listed, but only abnormal results are displayed)  Labs Reviewed  CBC - Abnormal; Notable for the following components:  Result Value   RBC 3.69 (*)    Hemoglobin 12.3 (*)    HCT 33.7 (*)    MCHC 36.6 (*)    All other components within normal limits  COMPREHENSIVE METABOLIC PANEL - Abnormal; Notable for the following components:   Sodium 126 (*)    Chloride 92 (*)    Glucose, Bld 114 (*)    BUN 34 (*)    Calcium 7.9 (*)    All other components within normal limits  PROTIME-INR  ETHANOL    RADIOLOGY I, [place] N Sophiea Ueda, personally viewed and evaluated these images (plain radiographs) as part of my medical decision making, as well as reviewing the written report by the radiologist.  ED MD interpretation:    Official radiology report(s): Ct Head Wo Contrast  Result Date: 09/16/2017 CLINICAL DATA:  Tripped on a curb with laceration to the head EXAM: CT HEAD WITHOUT CONTRAST CT CERVICAL SPINE WITHOUT CONTRAST TECHNIQUE: Multidetector CT imaging of the head and cervical spine was performed following the standard protocol without intravenous contrast. Multiplanar CT image reconstructions of the cervical spine were also generated. COMPARISON:  CT brain 02/08/2017, CT cervical spine 11/29/2014 FINDINGS: CT HEAD FINDINGS Brain: No acute territorial infarction, hemorrhage or intracranial  mass. 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 Moderate atrophy. Moderate small vessel ischemic changes of the white matter. Stable prominent ventricle size Vascular: No hyperdense vessels. Vertebral artery and carotid vascular calcification Skull: No fracture Sinuses/Orbits: No acute finding. Other: None CT CERVICAL SPINE FINDINGS Alignment: Trace anterolisthesis C3 on C4 as before. Facet alignment within normal limits. Skull base and vertebrae: No acute fracture. No primary bone lesion or focal pathologic process. Soft tissues and spinal canal: No prevertebral fluid or swelling. No visible canal hematoma. Disc levels: Moderate degenerative changes C3-C4, C4-C5 and C5-C6 with foraminal narrowing at these levels. Upper chest: Negative. Other: None IMPRESSION: 1. No CT evidence for acute intracranial abnormality. 2. Atrophy with small vessel ischemic changes of the white matter. 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 3. Degenerative changes of the cervical spine. No acute osseous abnormality. Electronically Signed   By: Donavan Foil M.D.   On: 09/16/2017 23:19   Ct Cervical Spine Wo Contrast  Result Date: 09/16/2017 CLINICAL DATA:  Tripped on a curb with laceration to the head EXAM: CT HEAD WITHOUT CONTRAST CT CERVICAL SPINE WITHOUT CONTRAST TECHNIQUE: Multidetector CT imaging of the head and cervical spine was performed following the standard protocol without intravenous contrast. Multiplanar CT image reconstructions of the cervical spine were also generated. COMPARISON:  CT brain 02/08/2017, CT cervical spine 11/29/2014 FINDINGS: CT HEAD FINDINGS Brain: No acute territorial infarction, hemorrhage or intracranial mass. 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 Moderate atrophy. Moderate small vessel ischemic changes of the white matter. Stable prominent ventricle size Vascular: No hyperdense vessels. Vertebral artery and carotid vascular calcification Skull: No fracture Sinuses/Orbits: No acute finding. Other: None CT CERVICAL SPINE FINDINGS Alignment: Trace  anterolisthesis C3 on C4 as before. Facet alignment within normal limits. Skull base and vertebrae: No acute fracture. No primary bone lesion or focal pathologic process. Soft tissues and spinal canal: No prevertebral fluid or swelling. No visible canal hematoma. Disc levels: Moderate degenerative changes C3-C4, C4-C5 and C5-C6 with foraminal narrowing at these levels. Upper chest: Negative. Other: None IMPRESSION: 1. No CT evidence for acute intracranial abnormality. 2. Atrophy with small vessel ischemic changes of the white matter. 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 3. Degenerative changes of the cervical spine. No acute osseous abnormality. Electronically Signed   By: Donavan Foil M.D.   On:  09/16/2017 23:19   Dg Hand Complete Right  Result Date: 09/17/2017 CLINICAL DATA:  Fall with right hand bruising EXAM: RIGHT HAND - COMPLETE 3+ VIEW COMPARISON:  None. FINDINGS: No acute displaced fracture or malalignment. Foreshortened appearance of the fifth digit with chronic appearing deformity at the fifth [facility] joint. Arthritis at the DIP and PIP joints. No foreign body in the soft tissues. IMPRESSION: 1. No definite acute osseous abnormality. 2. Chronic appearing deformity at the fifth [facility] joint. Electronically Signed   By: Donavan Foil M.D.   On: 09/17/2017 03:47   Dg Foot Complete Left  Result Date: 09/17/2017 CLINICAL DATA:  Tripped on curb bruising to the left foot EXAM: LEFT FOOT - COMPLETE 3+ VIEW COMPARISON:  11/29/2014 FINDINGS: Limited by osteopenia and flexed positioning of the distal digits. No fracture or malalignment. Degenerative changes at the first MTP joint and diffusely at the TMT joints. IMPRESSION: No acute osseous abnormality allowing for suboptimal positioning. Electronically Signed   By: Donavan Foil M.D.   On: 09/17/2017 03:41   Dg Foot Complete Right  Result Date: 09/17/2017 CLINICAL DATA:  Fall with foot pain EXAM: RIGHT FOOT COMPLETE - 3+ VIEW COMPARISON:  11/29/2014 FINDINGS: Osteopenia.  No acute displaced fracture or malalignment. Moderate plantar calcaneal spur. Degenerative changes at the first MTP joint. IMPRESSION: No acute osseous abnormality. Electronically Signed   By: Donavan Foil M.D.   On: 09/17/2017 03:48     .Marland KitchenLaceration Repair Date/Time: 09/17/2017 5:20 AM Performed by: Gregor Hams, MD Authorized by: Gregor Hams, MD   Consent:    Consent obtained:  Verbal   Consent given by:  Patient   Risks discussed:  Infection, pain, retained foreign body, poor cosmetic result and poor wound healing Anesthesia (see MAR for exact dosages):    Anesthesia method:  None Laceration details:    Length (cm):  1   Depth (mm):  3 Repair type:    Repair type:  Simple Exploration:    Hemostasis achieved with:  Direct pressure   Wound exploration: entire depth of wound probed and visualized     Contaminated: no   Treatment:    Area cleansed with:  Saline   Amount of cleaning:  Extensive   Irrigation solution:  Sterile saline   Visualized foreign bodies/material removed: no   Skin repair:    Repair method:  Tissue adhesive Approximation:    Approximation:  Close Post-procedure details:    Dressing:  Sterile dressing   Patient tolerance of procedure:  Tolerated well, no immediate complications     ____________________________________________   INITIAL IMPRESSION / ASSESSMENT AND PLAN / ED COURSE  As part of my medical decision making, I reviewed the following data within the [place] NUMBER   72 year old male presenting with above-stated history and physical exam with concern for possible intracranial injury/skull fracture/concussion.  As such CT scan of the head was performed which revealed no acute intracranial findings.  CT scan of cervical spine revealed no evidence of fracture.  X-ray of the patient's right hand and bilateral foot revealed no evidence of fracture per the radiologist.  On reevaluation patient did not recall my initial  evaluation.  However patient is now alert and oriented and calls accidental fall while it [employer].   ____________________________________________  FINAL CLINICAL IMPRESSION(S) / ED DIAGNOSES  Final diagnoses:  Concussion with loss of consciousness, initial encounter  Contusion of left foot, initial encounter  Facial laceration, initial encounter  Facial abrasion, initial encounter  MEDICATIONS GIVEN DURING THIS VISIT:  Medications  sodium chloride 0.9 % bolus 1,000 mL (0 mLs Intravenous Stopped 09/17/17 0135)  sodium chloride 0.9 % bolus 1,000 mL (0 mLs Intravenous Stopped 09/17/17 0055)     ED Discharge Orders    None       Note:  This document was prepared using Dragon voice recognition software and may include unintentional dictation errors.    Gregor Hams, MD 09/17/17 [phone]

## 2017-09-16 NOTE — ED Triage Notes (Signed)
Pt tripped on curb going into [employer]. Lac to head, rt? Arm pain and abrasions to rt knee. Decreased loc on arrival. Stat ct ordered.

## 2017-09-17 ENCOUNTER — Emergency Department: Payer: Medicare Other

## 2017-09-17 DIAGNOSIS — S060X9A Concussion with loss of consciousness of unspecified duration, initial encounter: Secondary | ICD-10-CM | POA: Diagnosis not present

## 2017-09-17 NOTE — ED Notes (Signed)
Pt using urinal att, pt talking to this RN about POC, A & O x 4

## 2017-09-17 NOTE — ED Notes (Signed)
Patient transported to X-ray 

## 2017-09-17 NOTE — ED Notes (Signed)
Family at bedside. 

## 2017-09-17 NOTE — ED Notes (Signed)
Pt resting quietly in bed, eyes closed, resp even; opened eyes when name called; bed adjusted for comfort; no complaints or requests; side rails up x 2 with call bell in reach;

## 2017-09-17 NOTE — ED Notes (Signed)
ED Provider at bedside.  Lac to forehead dermabond applied, left and right feet assessed for bruising on left great toe, right hand assessed for swelling

## 2017-11-21 ENCOUNTER — Encounter: Payer: Self-pay | Admitting: Emergency Medicine

## 2017-11-21 ENCOUNTER — Other Ambulatory Visit: Payer: Self-pay

## 2017-11-21 ENCOUNTER — Emergency Department: Payer: Medicare Other

## 2017-11-21 ENCOUNTER — Emergency Department
Admission: EM | Admit: 2017-11-21 | Discharge: 2017-11-21 | Disposition: A | Discharge status: Home / Self Care | Payer: Medicare Other | Attending: Internal Medicine | Admitting: Internal Medicine

## 2017-11-21 DIAGNOSIS — Z7982 Long term (current) use of aspirin: Secondary | ICD-10-CM | POA: Diagnosis not present

## 2017-11-21 DIAGNOSIS — Z79899 Other long term (current) drug therapy: Secondary | ICD-10-CM | POA: Diagnosis not present

## 2017-11-21 DIAGNOSIS — Z87891 Personal history of nicotine dependence: Secondary | ICD-10-CM | POA: Insufficient documentation

## 2017-11-21 DIAGNOSIS — T50901A Poisoning by unspecified drugs, medicaments and biological substances, accidental (unintentional), initial encounter: Secondary | ICD-10-CM

## 2017-11-21 DIAGNOSIS — R55 Syncope and collapse: Secondary | ICD-10-CM

## 2017-11-21 DIAGNOSIS — Z8546 Personal history of malignant neoplasm of prostate: Secondary | ICD-10-CM | POA: Insufficient documentation

## 2017-11-21 DIAGNOSIS — I10 Essential (primary) hypertension: Secondary | ICD-10-CM | POA: Insufficient documentation

## 2017-11-21 DIAGNOSIS — Z8673 Personal history of transient ischemic attack (TIA), and cerebral infarction without residual deficits: Secondary | ICD-10-CM | POA: Diagnosis not present

## 2017-11-21 LAB — COMPREHENSIVE METABOLIC PANEL
ALK PHOS: 52 U/L (ref 38–126)
ALT: 34 U/L (ref 0–44)
ANION GAP: 8 (ref 5–15)
AST: 30 U/L (ref 15–41)
Albumin: 3.4 g/dL — ABNORMAL LOW (ref 3.5–5.0)
BILIRUBIN TOTAL: 0.6 mg/dL (ref 0.3–1.2)
BUN: 25 mg/dL — ABNORMAL HIGH (ref 8–23)
CO2: 26 mmol/L (ref 22–32)
Calcium: 8.2 mg/dL — ABNORMAL LOW (ref 8.9–10.3)
Chloride: 94 mmol/L — ABNORMAL LOW (ref 98–111)
Creatinine, Ser: 1 mg/dL (ref 0.61–1.24)
GFR calc non Af Amer: >60 mL/min (ref >60)
Glucose, Bld: 124 mg/dL — ABNORMAL HIGH (ref 70–99)
Potassium: 3.5 mmol/L (ref 3.5–5.1)
SODIUM: 128 mmol/L — AB (ref 135–145)
TOTAL PROTEIN: 6.5 g/dL (ref 6.5–8.1)

## 2017-11-21 LAB — LIPASE, BLOOD: LIPASE: 33 U/L (ref 11–51)

## 2017-11-21 LAB — URINALYSIS, COMPLETE (UACMP) WITH MICROSCOPIC
BACTERIA UA: NONE SEEN
Bilirubin Urine: NEGATIVE
Glucose, UA: NEGATIVE mg/dL
Hgb urine dipstick: NEGATIVE
KETONES UR: NEGATIVE mg/dL
Leukocytes, UA: NEGATIVE
Nitrite: NEGATIVE
PROTEIN: NEGATIVE mg/dL
Specific Gravity, Urine: 1.008 (ref 1.005–1.030)
pH: 7 (ref 5.0–8.0)

## 2017-11-21 LAB — CBC WITH DIFFERENTIAL/PLATELET
BASOS PCT: 1 %
Basophils Absolute: 0.1 10*3/uL (ref 0–0.1)
Eosinophils Absolute: 0.1 10*3/uL (ref 0–0.7)
Eosinophils Relative: 2 %
HEMATOCRIT: 29.9 % — AB (ref 40.0–52.0)
Hemoglobin: 10.9 g/dL — ABNORMAL LOW (ref 13.0–18.0)
LYMPHS ABS: 1.7 10*3/uL (ref 1.0–3.6)
LYMPHS PCT: 23 %
MCH: 34.1 pg — AB (ref 26.0–34.0)
MCHC: 36.7 g/dL — AB (ref 32.0–36.0)
MCV: 93.1 fL (ref 80.0–100.0)
MONO ABS: 0.9 10*3/uL (ref 0.2–1.0)
MONOS PCT: 12 %
NEUTROS ABS: 4.7 10*3/uL (ref 1.4–6.5)
Neutrophils Relative %: 62 %
Platelets: 284 10*3/uL (ref 150–440)
RBC: 3.21 MIL/uL — ABNORMAL LOW (ref 4.40–5.90)
RDW: 13.2 % (ref 11.5–14.5)
WBC: 7.4 10*3/uL (ref 3.8–10.6)

## 2017-11-21 LAB — BLOOD GAS, VENOUS
Acid-Base Excess: 3.2 mmol/L — ABNORMAL HIGH (ref 0.0–2.0)
Bicarbonate: 29 mmol/L — ABNORMAL HIGH (ref 20.0–28.0)
O2 Saturation: 70.8 %
PATIENT TEMPERATURE: 37
pCO2, Ven: 49 mmHg (ref 44.0–60.0)
pH, Ven: 7.38 (ref 7.250–7.430)
pO2, Ven: 38 mmHg (ref 32.0–45.0)

## 2017-11-21 LAB — ETHANOL: Alcohol, Ethyl (B): <10 mg/dL (ref <10)

## 2017-11-21 LAB — TROPONIN I: Troponin I: <0.03 ng/mL (ref <0.03)

## 2017-11-21 LAB — LACTIC ACID, PLASMA: Lactic Acid, Venous: 1.9 mmol/L (ref 0.5–1.9)

## 2017-11-21 LAB — ACETAMINOPHEN LEVEL: Acetaminophen (Tylenol), Serum: <10 ug/mL — ABNORMAL LOW (ref 10–30)

## 2017-11-21 MED ORDER — SODIUM CHLORIDE 0.9 % IV BOLUS
1000.0000 mL | Freq: Once | INTRAVENOUS | Status: AC
  Administered 2017-11-21: 1000 mL via INTRAVENOUS

## 2017-11-21 NOTE — ED Notes (Signed)
Pacemaker interrogated by [employer]

## 2017-11-21 NOTE — ED Notes (Signed)
Pt moved to 1H to await his wife coming to get him. Pt states understanding. Will continue to monitor for further patient needs.

## 2017-11-21 NOTE — Discharge Instructions (Addendum)
Be very careful taking her medications follow closely with primary care, you do not wish to be admitted to the hospital.  It was our strong consideration that you should be given how low your blood pressure was on the way in, however, you would prefer not to stay.  This is your choice but it limits our ability to further evaluate you.  If you feel worse in any way, please return to the emergency department at any time for further care.

## 2017-11-21 NOTE — ED Notes (Signed)
NAD noted at time of D/C. Pt denies questions or concerns. Pt taken to lobby via wheelchair. Pt D/C into the care of his friend at this time.

## 2017-11-21 NOTE — ED Triage Notes (Signed)
EMS called to restaurant due to syncopal episode.  Patietn drove self to restaurant.  Initial BP 70's, rechecked SBP:  40'.  Arrives with BP 132/64.  Patient has history of Pacemaker and states he just felt very tired.  #20 g Right wrist, @20g  Left wrsit.  CBG:  118. PMH:  Bipolar, HTN.  On arrival patien tis AAOx3.  skin warm and dry.

## 2017-11-21 NOTE — ED Notes (Signed)
Admitting MD at bedside at this time.

## 2017-11-21 NOTE — ED Provider Notes (Addendum)
[facility] Emergency Department Provider Note  ____________________________________________   I have reviewed the triage vital signs and the nursing notes. Where available I have reviewed prior notes and, if possible and indicated, outside hospital notes.    HISTORY  Chief Complaint Loss of Consciousness    HPI Douglas Perkins. is a 72 y.o. male who presents today complaining of syncope.  Patient has history of bipolar disorder, prostate cancer in the past, hypertension for which he takes "5 medications", had 2 of them this morning metoprolol lisinopril, states is been no change in his dosing and does not believe there is any chance he can effectively taken too many, also history of pacemaker, has had syncope in the past, comes in as an emergency traffic for a syncopal event.  Patient was in a [employer] had his food and then passed out at the table.  Initially blood pressure was 80 then rapidly went down to the 40s, no prodrome, patient states he sat down in the 80s chair, felt lightheaded and then passed out.  He did not fall or hit his head.  EMS reports that he did respond to fluids on the way in.  Patient has a pan negative review of systems otherwise, he denies any chest pain shortness breath nausea vomiting diarrhea chest pain headache stiff neck focal numbness or weakness he just states he feels weak.  He denies dysuria urinary frequency, states he felt fine this morning.  Denies melena denies bright red blood per rectum.  States he just felt weak, he does have baseline poor balance and wears a life alert but he states he is only passed out once may be in the past that he can recall.       Past Medical History:  Diagnosis Date  . Bipolar 1 disorder ([place])   . Cancer (HCC)    merkel cell cancer  . Cancer [facility])    prostate  . Glaucoma   . Hypertension   . 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 Presence of permanent  cardiac pacemaker   . TIA (transient ischemic attack)     Patient Active Problem List   Diagnosis Date Noted  . Near syncope 05/22/2017  . Syncope 07/19/2015  . Sepsis ([place]) 07/07/2015  . Bipolar I disorder ([place]) 06/24/2015  . Bilateral pneumonia 06/23/2015  . 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 Bradycardia 02/08/2015  . 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 AF (amaurosis fugax) 09/16/2014  . Benign essential HTN 09/16/2014  . Chest pain 09/16/2014  . Skin cyst 08/06/2012  . Personal history of lymphoma 08/06/2012  . Cancer [facility])     Past Surgical History:  Procedure Laterality Date  . CHOLECYSTECTOMY    . COLONOSCOPY WITH PROPOFOL N/A 01/31/2017   Procedure: COLONOSCOPY WITH PROPOFOL;  Surgeon: Manya Silvas, MD;  Location: [facility] ENDOSCOPY;  Service: Endoscopy;  Laterality: N/A;  . LEG SURGERY Left    distal  . Pace maker placement    . PROSTATE SURGERY    . PROSTATECTOMY    . SKIN CANCER EXCISION  2006,2007   Merkle Cell Carcinoma    Prior to Admission medications   Medication Sig Start Date End Date Taking? Authorizing Provider  amLODipine (NORVASC) 10 MG tablet Take by mouth daily.  01/10/17   [provider]  ARIPiprazole (ABILIFY) 10 MG tablet Take 30 mg by mouth 2 (two) times daily. 10mg -AM/20mg -PM 01/29/17  [provider]  aspirin EC 81 MG tablet Take 81 mg by mouth daily. Reported on 08/18/2015    [provider]  carbamazepine (TEGRETOL) 200 MG tablet Take 600 mg by mouth 2 (two) times daily.    [provider]  ferrous sulfate 325 (65 FE) MG tablet Take 325 mg by mouth daily.     [provider]  gabapentin (NEURONTIN) 100 MG capsule Take 2 capsules by mouth 2 (two) times daily. 03/16/17   [provider]  glucosamine-chondroitin 500-400 MG tablet Take 1 tablet by mouth daily.    [provider]  hydrALAZINE (APRESOLINE) 25 MG tablet Take 1 tablet (25 mg total) by  mouth 3 (three) times daily. 05/23/17 05/23/18  Epifanio Lesches, MD  latanoprost (XALATAN) 0.005 % ophthalmic solution Place 1 drop into both eyes 2 (two) times daily.     [provider]  lisinopril (PRINIVIL,ZESTRIL) 40 MG tablet Take 40 mg by mouth 2 (two) times daily.     [provider]  metoprolol succinate (TOPROL-XL) 50 MG 24 hr tablet Take 50 mg by mouth 2 (two) times daily. Take with or immediately following a meal.    [provider]  Multiple Vitamin (MULTIVITAMIN WITH MINERALS) TABS tablet Take 1 tablet by mouth daily.    [provider]  Omega-3 Fatty Acids (FISH OIL) 1000 MG CAPS Take 1,000 mg by mouth 2 (two) times daily.     [provider]  QUEtiapine (SEROQUEL) 300 MG tablet Take 600 mg by mouth at bedtime.    [provider]  simvastatin (ZOCOR) 40 MG tablet Take 40 mg by mouth daily at 6 PM.    [provider]  temazepam (RESTORIL) 15 MG capsule Take 15 mg by mouth at bedtime as needed for sleep.    [provider]  timolol (BETIMOL) 0.5 % ophthalmic solution Place 1 drop into the right eye daily.    [provider]    Allergies Other; Feldene [piroxicam]; and Sulfa antibiotics  Family History  Problem Relation Age of Onset  . Stroke Father   . CAD Paternal Grandmother   . CAD Paternal Grandfather     Social History Social History   Tobacco Use  . Smoking status: Former Research scientist (life sciences)  . Smokeless tobacco: Never Used  Substance Use Topics  . Alcohol use: No    Alcohol/week: 0.0 oz  . Drug use: No    Review of Systems Constitutional: No fever/chills Eyes: No visual changes. ENT: No sore throat. No stiff neck no neck pain Cardiovascular: Denies chest pain. Respiratory: Denies shortness of breath. Gastrointestinal:   no vomiting.  No diarrhea.  No constipation. Genitourinary: Negative for dysuria. Musculoskeletal: Negative lower extremity swelling Skin: Negative for  rash. Neurological: Negative for severe headaches, focal weakness or numbness.   ____________________________________________   PHYSICAL EXAM:  VITAL SIGNS: ED Triage Vitals [11/21/17 0817]  Enc Vitals Group     BP      Pulse      Resp      Temp      Temp src      SpO2      Weight 250 lb (113.4 kg)     Height 6' (1.829 m)     Head Circumference      Peak Flow      Pain Score 0     Pain Loc      Pain Edu?      Excl. in [place]?     Constitutional:  Alert and oriented. Well appearing and in no acute distress. Eyes: Conjunctivae are normal Head: Atraumatic HEENT: No congestion/rhinnorhea. Mucous membranes are dry.  Oropharynx non-erythematous Neck:   Nontender with no meningismus, no masses, no stridor Cardiovascular: Normal rate, regular rhythm. Grossly normal heart sounds.  Good peripheral circulation. Respiratory: Normal respiratory effort.  No retractions. Lungs CTAB. Abdominal: Soft and nontender. No distention. No guarding no rebound Back:  There is no focal tenderness or step off.  there is no midline tenderness there are no lesions noted. there is no CVA tenderness Musculoskeletal: No lower extremity tenderness, no upper extremity tenderness. 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 No gross focal neurologic deficits are appreciated.  Skin:  Skin is warm, dry and intact. No rash noted. Psychiatric: Mood and affect are normal. Speech and behavior are normal.  ____________________________________________   LABS (all labs ordered are listed, but only abnormal results are displayed)  Labs Reviewed  CBC WITH DIFFERENTIAL/PLATELET  TROPONIN I  URINALYSIS, COMPLETE (UACMP) WITH MICROSCOPIC  ETHANOL  COMPREHENSIVE METABOLIC PANEL  ACETAMINOPHEN LEVEL  LIPASE, BLOOD  LACTIC ACID, PLASMA  LACTIC ACID, PLASMA  CBG MONITORING, ED    Pertinent labs   results that were available during my care of the patient were reviewed by me and considered in my medical decision making (see chart for details). ____________________________________________  EKG  I personally interpreted any EKGs ordered by me or triage Atrially paced rhythm, rate 60 bpm, right bundle branch block configuration noted.  LAFB noted, no change from prior of any significant ____________________________________________  RADIOLOGY  Pertinent labs & imaging results that were available during my care of the patient were reviewed by me and considered in my medical decision making (see chart for details). If possible, patient and/or family made aware of any abnormal findings.  No results found. ____________________________________________    PROCEDURES  Procedure(s) performed: None  Procedures  Critical Care performed: CRITICAL CARE Performed by: Schuyler Amor   Total critical care time: 45 minutes  Critical care time was exclusive of separately billable procedures and treating other patients.  Critical care was necessary to treat or prevent imminent or life-threatening deterioration.  Critical care was time spent personally by me on the following activities: development of treatment plan with patient and/or surrogate as well as nursing, discussions with consultants, evaluation of patient's response to treatment, examination of patient, obtaining history from patient or surrogate, ordering and performing treatments and interventions, ordering and review of laboratory studies, ordering and review of radiographic studies, pulse oximetry and re-evaluation of patient's condition.   ____________________________________________   INITIAL IMPRESSION / ASSESSMENT AND PLAN / ED COURSE  Pertinent labs & imaging results that were available during my care of the patient were reviewed by me and considered in my medical decision making (see chart for details).  Patient here  after a syncopal event sitting, with no trauma and a subsequent significant hypotensive which is been responsive to IV fluids.  We did get his pacemaker interrogated already and they showed no significant cardiac dysrhythmia.  Notes do indicate that he had a fall about 6 months ago at a [employer], not, however, was a non-syncopal fall.  Patient has no injury or trauma by report or exam.  He is neurologically intact, he is slightly slow to respond and he states he is speaking at his baseline.  We are going to continue to  hydrate him as he does appear to be somewhat dehydrated we will check diffuse blood work, I will get a chest x-ray, EKG is been performed already does not show any significant dysrhythmia.  Unclear why the patient passed out could have been a vagal event however that was a pretty profound hypotension.  ----------------------------------------- 9:09 AM on 11/21/2017 -----------------------------------------  Unassigned blood work thus far reassuring, patient has chronic hyponatremia which is again evidenced, he has a elevated BUN/creatinine ratio suggestive some degree of dehydration which appears to be clinically the case as well.  He remains in no acute distress and nonfocal but he is very somnolent in the room.  He wakes up to verbal stimuli and will speak with me appropriately however then he goes back to sleep.  He is unclear why this is happening.  He states he has any change of medication and was up late last night.  Denies drinking alcohol this morning.  We will obtain CT to make sure no other cause is present that is occult  ----------------------------------------- 9:42 AM on 11/21/2017 -----------------------------------------  Wife calls and says his evening meds are missing and he likely took them by accident. He has done this before by accident. No si. She states that when this happens he just has to sleep it off because included in his meds are sleeping pills.  She says  there has been no change in his meds recently and 'you should have  A list'  She does not have a list of his meds.   ----------------------------------------- 11:23 AM on 11/21/2017 -----------------------------------------  Patient in no acute distress, much more awake at this time vital signs are stabilized, he is adamant that he will not be admitted to the hospital.  He states it was an accident that he took extra meds.  He has no evidence at this time, he wants to go home.  He would prefer to leave AMA than the admitted.  The hospitalist did explain to him the risk benefits alternatives of admission and he absolutely declines.  He states he has things he needs to do at home.  I think he has the capacity to make this decision.  We will ensure that he is able to walk around if he looks okay we will try to get him home and patient counseled not to drive.    ____________________________________________   FINAL CLINICAL IMPRESSION(S) / ED DIAGNOSES  Final diagnoses:  Syncope      This chart was dictated using voice recognition software.  Despite best efforts to proofread,  errors can occur which can change meaning.      Schuyler Amor, MD 11/21/17 5003    Schuyler Amor, MD 11/21/17 7048    Schuyler Amor, MD 11/21/17 0945    Schuyler Amor, MD 11/21/17 1009    Schuyler Amor, MD 11/21/17 1124    Schuyler Amor, MD 11/21/17 [phone]

## 2017-11-21 NOTE — ED Notes (Signed)
Pt resting in bed at this time, bolus stopped after approx 500cc's per MD order. Pt eyes noted to be closed, pt with mild snoring respirations, O2 sats 100% on RA. Will continue to monitor for further patient needs.

## 2017-11-21 NOTE — ED Notes (Signed)
Pt given meal tray at this time and phone to call his wife to pick him up. Will continue to monitor.

## 2018-08-15 ENCOUNTER — Ambulatory Visit: Payer: Medicare Other | Admitting: Oncology

## 2018-08-15 ENCOUNTER — Other Ambulatory Visit: Payer: Medicare Other

## 2018-09-12 IMAGING — CR DG HAND COMPLETE 3+V*R*
1 series · 3 of 3 positions shown · non-contrast
Comparison: None.

CLINICAL DATA: Fall with right hand bruising

EXAM:
RIGHT HAND - COMPLETE 3+ VIEW

[Series 1: dg hand complete right · 0.14mm/px · 3 of 3 slices shown]
[im 1/3]
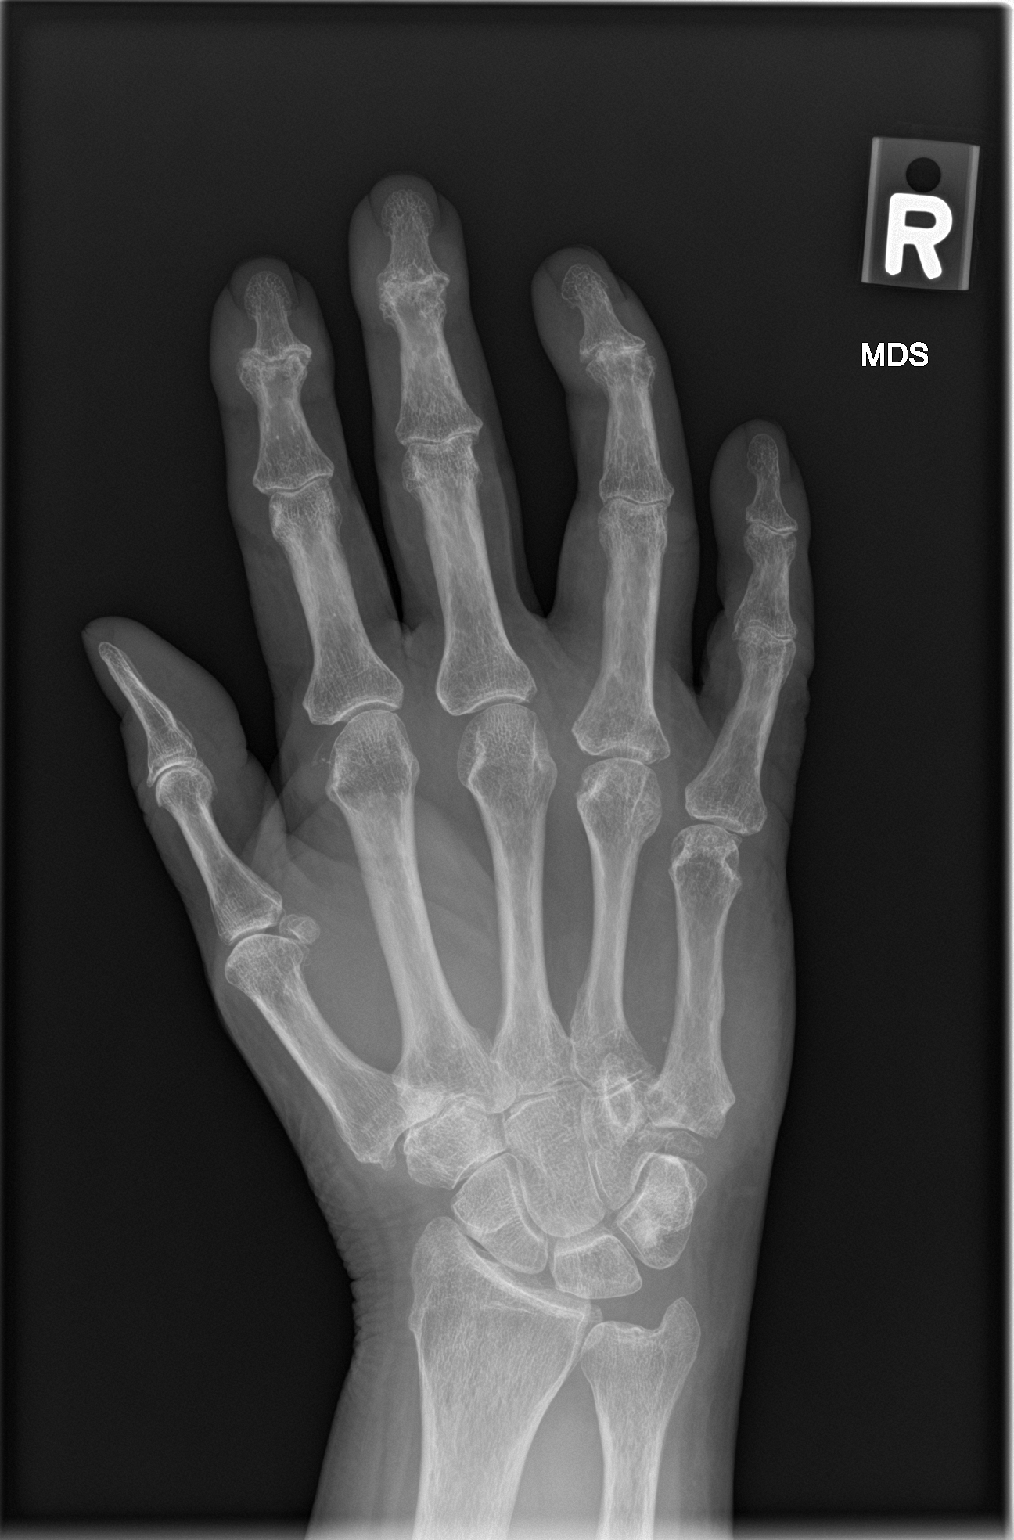
[im 2/3]
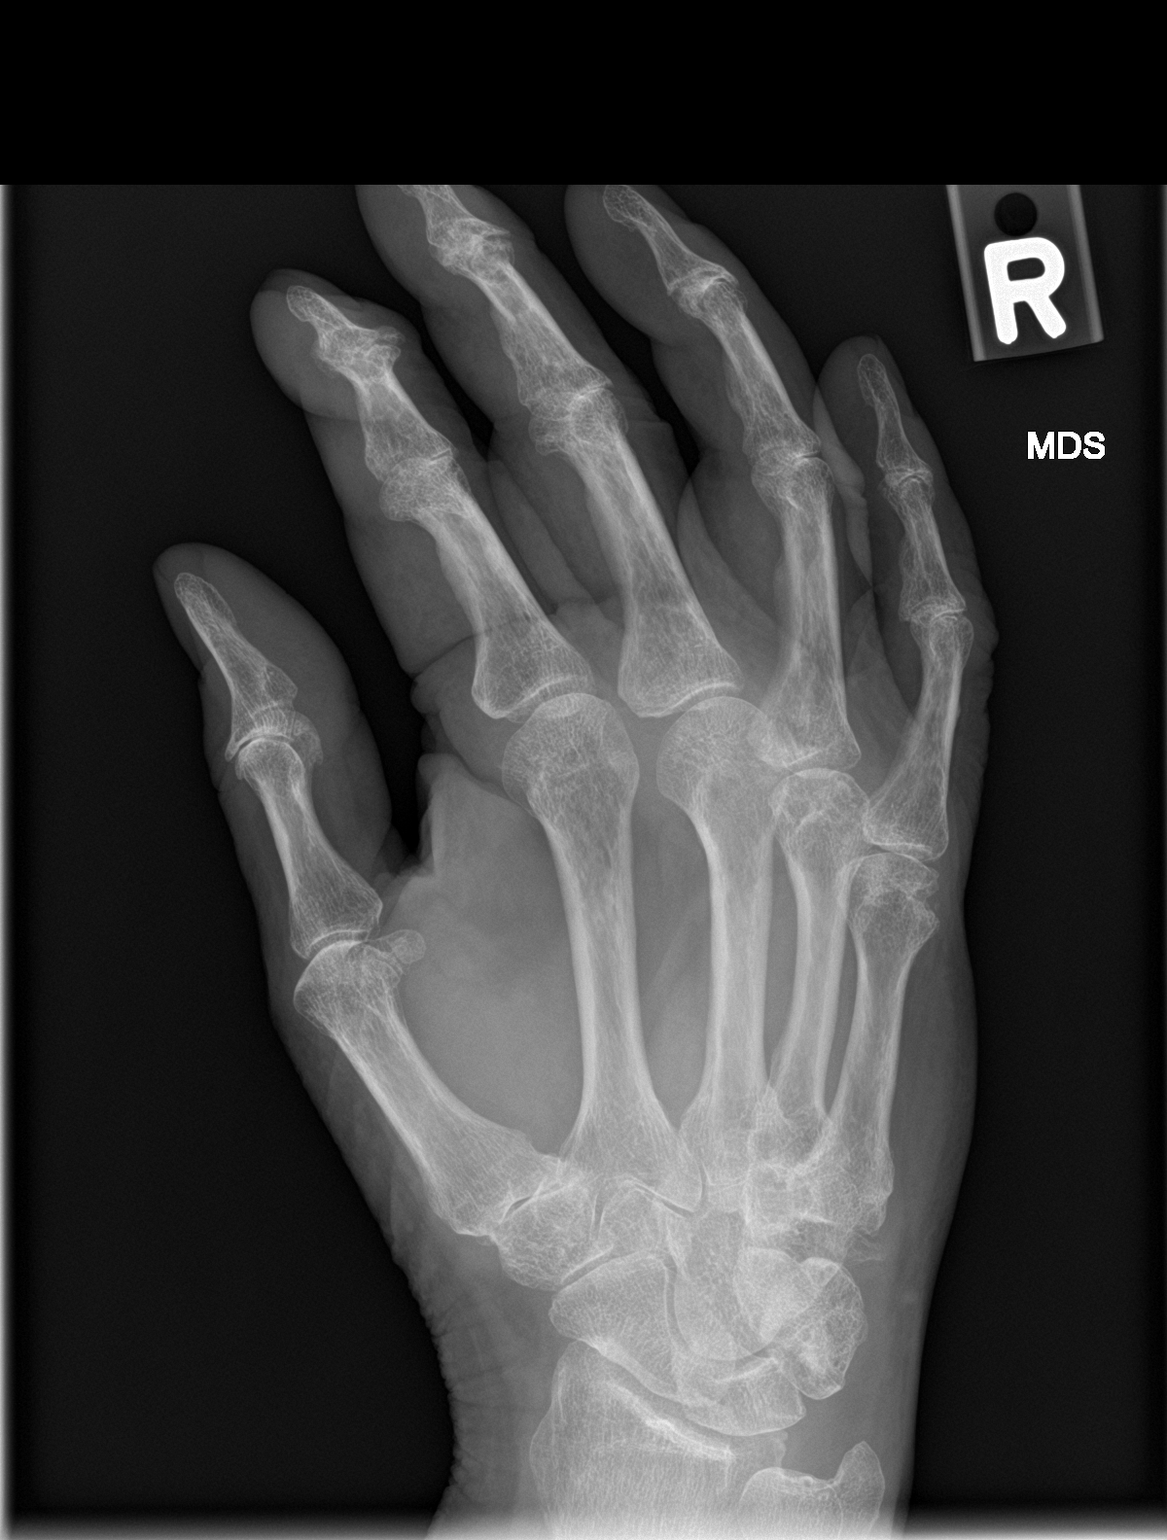
[im 3/3]
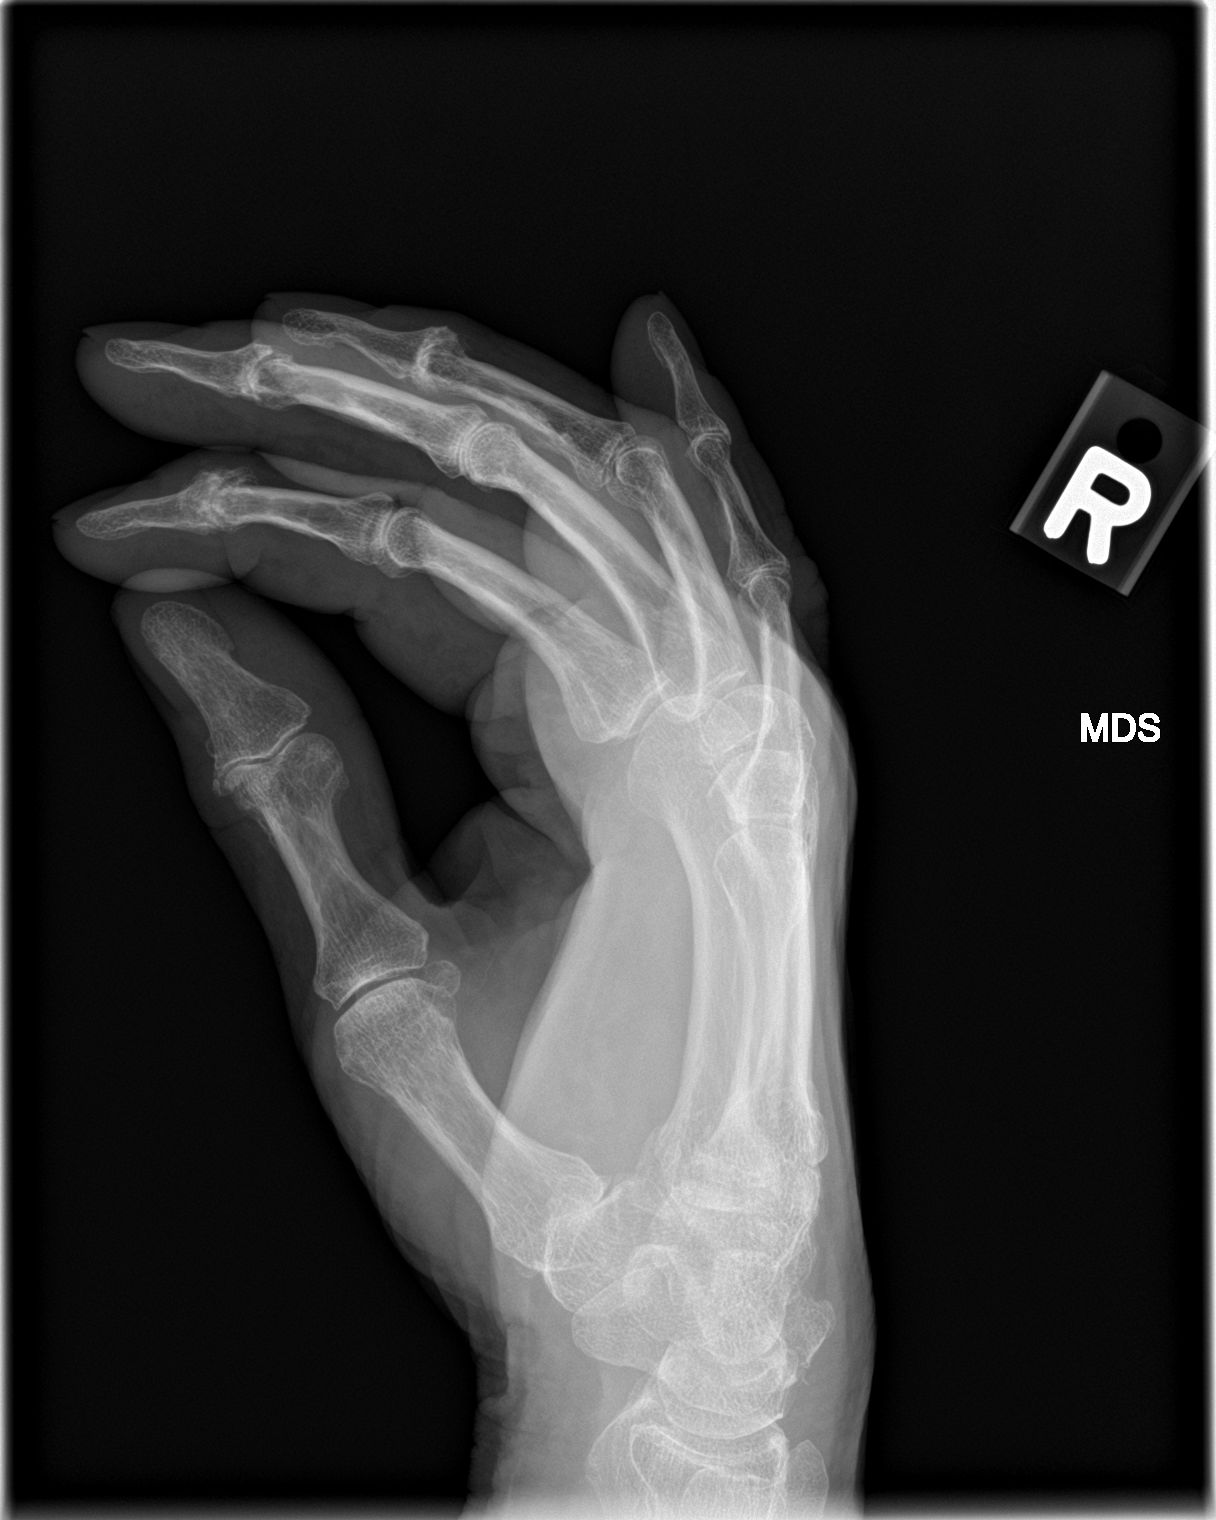

[3 of 3 positions shown; findings below may reference images not displayed]

FINDINGS: No acute displaced fracture or malalignment. Foreshortened
appearance of the fifth digit with chronic appearing deformity at
the fifth CMC joint. Arthritis at the DIP and PIP joints. No foreign
body in the soft tissues.
IMPRESSION: 1. No definite acute osseous abnormality.
2. Chronic appearing deformity at the fifth CMC joint.

## 2018-10-02 ENCOUNTER — Ambulatory Visit: Payer: Medicare Other | Admitting: Oncology

## 2018-10-02 ENCOUNTER — Other Ambulatory Visit: Payer: Medicare Other

## 2018-11-12 DIAGNOSIS — N528 Other male erectile dysfunction: Secondary | ICD-10-CM | POA: Insufficient documentation

## 2018-11-16 NOTE — Progress Notes (Signed)
[place]  Telephone:([phone] [phone]  ID: Douglas Perkins. OB: Aug 23, 1945  MR#: [id number]  PTW#:[id number]  Patient Care Team: [name], MD as PCP - General (Family Medicine) Christene Lye, MD (General Surgery)  CHIEF COMPLAINT: History of Merkel cell on right leg.  INTERVAL HISTORY: Patient returns to clinic today for repeat laboratory work and routine yearly evaluation.  He continues to feel well and remains asymptomatic.  He has no neurologic complaints. He denies any recent fevers or illnesses.  He has a good appetite and denies weight loss.  He has no chest pain, shortness of breath, cough, or hemoptysis. He denies any nausea, vomiting, constipation, or diarrhea.  He has no urinary complaints.  He continues to have mild right lower extremity edema, but this is chronic and unchanged since his surgery for Merkel cell.  Patient feels at his baseline offers no specific complaints today.  REVIEW OF SYSTEMS:   Review of Systems  Constitutional: Negative.  Negative for fever, malaise/fatigue and weight loss.  Respiratory: Negative.  Negative for cough and shortness of breath.   Cardiovascular: Negative.  Negative for chest pain and leg swelling.  Gastrointestinal: Negative.  Negative for abdominal pain, blood in stool, constipation, diarrhea, melena, nausea and vomiting.  Musculoskeletal: Negative.  Negative for back pain.  Skin: Negative.  Negative for rash.  Neurological: Negative.  Negative for sensory change, focal weakness and weakness.  Psychiatric/Behavioral: Negative.  Negative for depression. The patient is not nervous/anxious.     As per HPI. Otherwise, a complete review of systems is negative.  PAST MEDICAL HISTORY: Past Medical History:  Diagnosis Date  . Bipolar 1 disorder ([place])   . Cancer (HCC)    merkel cell cancer  . Cancer [facility])    prostate  . Glaucoma   . Hypertension   . 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 Presence of permanent cardiac pacemaker   . TIA (transient ischemic attack)     PAST SURGICAL HISTORY: Past Surgical History:  Procedure Laterality Date  . CHOLECYSTECTOMY    . COLONOSCOPY WITH PROPOFOL N/A 01/31/2017   Procedure: COLONOSCOPY WITH PROPOFOL;  Surgeon: Manya Silvas, MD;  Location: [facility] ENDOSCOPY;  Service: Endoscopy;  Laterality: N/A;  . LEG SURGERY Left    distal  . Pace maker placement    . PROSTATE SURGERY    . PROSTATECTOMY    . SKIN CANCER EXCISION  2006,2007   Merkle Cell Carcinoma    FAMILY HISTORY: Heart disease and bipolar disease.    ADVANCED DIRECTIVES:    HEALTH MAINTENANCE: Social History   Tobacco Use  . Smoking status: Former Research scientist (life sciences)  . Smokeless tobacco: Never Used  Substance Use Topics  . Alcohol use: No    Alcohol/week: 0.0 standard drinks  . Drug use: No     Colonoscopy:  PAP:  Bone density:  Lipid panel:  Allergies  Allergen Reactions  . Other Hives, Shortness Of Breath and Other (See Comments)    Pt states that he is allergic to unwashed blood products.   Other reaction(s): Other (See Comments) UNWASHED BLOOD PRODUCTS  Pt states that he is allergic to unwashed blood products.   . Feldene [Piroxicam] Hives  . Sulfa Antibiotics Hives    Current Outpatient Medications  Medication Sig Dispense Refill  . atorvastatin (LIPITOR) 40 MG tablet Take 1 tablet by mouth 1 day or 1 dose.    . sildenafil (REVATIO) 20  MG tablet Take 1 tablet by mouth 1 day or 1 dose.    Marland Kitchen amLODipine (NORVASC) 10 MG tablet Take by mouth daily.   0  . ARIPiprazole (ABILIFY) 10 MG tablet Take 30 mg by mouth 2 (two) times daily. 10mg -AM/20mg -PM  0  . aspirin EC 81 MG tablet Take 81 mg by mouth daily. Reported on 08/18/2015    . carbamazepine (TEGRETOL) 200 MG tablet Take 600 mg by mouth 2 (two) times daily.    . chlorthalidone (HYGROTON) 25 MG tablet Take 1 tablet by mouth daily.  3  . ferrous sulfate 325 (65 FE) MG  tablet Take 325 mg by mouth daily.     Marland Kitchen gabapentin (NEURONTIN) 100 MG capsule Take 2 capsules by mouth 2 (two) times daily.  0  . glucosamine-chondroitin 500-400 MG tablet Take 1 tablet by mouth daily.    . hydrALAZINE (APRESOLINE) 25 MG tablet Take 1 tablet (25 mg total) by mouth 3 (three) times daily. 90 tablet 0  . latanoprost (XALATAN) 0.005 % ophthalmic solution Place 1 drop into both eyes 2 (two) times daily.     Marland Kitchen lisinopril (PRINIVIL,ZESTRIL) 40 MG tablet Take 40 mg by mouth 2 (two) times daily.     . metoprolol succinate (TOPROL-XL) 50 MG 24 hr tablet Take 50 mg by mouth 2 (two) times daily. Take with or immediately following a meal.    . Multiple Vitamin (MULTIVITAMIN WITH MINERALS) TABS tablet Take 1 tablet by mouth daily.    . Omega-3 Fatty Acids (FISH OIL) 1000 MG CAPS Take 1,000 mg by mouth 2 (two) times daily.     . OMEGA-3 FATTY ACIDS PO Take 1 capsule by mouth 1 day or 1 dose.    Marland Kitchen QUEtiapine (SEROQUEL) 300 MG tablet Take 450 mg by mouth at bedtime.     . simvastatin (ZOCOR) 40 MG tablet Take 40 mg by mouth daily at 6 PM.    . temazepam (RESTORIL) 15 MG capsule Take 15 mg by mouth at bedtime as needed for sleep.    Marland Kitchen timolol (BETIMOL) 0.5 % ophthalmic solution Place 1 drop into the right eye daily.     No current facility-administered medications for this visit.     OBJECTIVE: Vitals:   11/20/18 1008  BP: (!) 181/76  Pulse: (!) 59  Temp: 98.1 F (36.7 C)     Body mass index is 31.74 kg/m.    ECOG FS:0 - Asymptomatic  General: Well-developed, well-nourished, no acute distress. Eyes: Pink conjunctiva, anicteric sclera. HEENT: Normocephalic, moist mucous membranes. Lungs: Clear to auscultation bilaterally. Heart: Regular rate and rhythm. No rubs, murmurs, or gallops. Abdomen: Soft, nontender, nondistended. No organomegaly noted, normoactive bowel sounds. Musculoskeletal: No edema, cyanosis, or clubbing. Neuro: Alert, answering all questions appropriately. Cranial  nerves grossly intact. Skin: No rashes or petechiae noted. Psych: Normal affect.  LAB RESULTS:  Lab Results  Component Value Date   NA 131 (L) 11/20/2018   K 4.2 11/20/2018   CL 98 11/20/2018   CO2 24 11/20/2018   GLUCOSE 112 (H) 11/20/2018   BUN 18 11/20/2018   CREATININE 0.46 (L) 11/20/2018   CALCIUM 8.4 (L) 11/20/2018   PROT 7.1 11/20/2018   ALBUMIN 4.0 11/20/2018   AST 26 11/20/2018   ALT 37 11/20/2018   ALKPHOS 61 11/20/2018   BILITOT 0.6 11/20/2018   GFRNONAA >60 11/20/2018   GFRAA >60 11/20/2018    Lab Results  Component Value Date   WBC 5.9 11/20/2018   NEUTROABS 3.1 11/20/2018  HGB 13.5 11/20/2018   HCT 37.2 (L) 11/20/2018   MCV 90.5 11/20/2018   PLT 264 11/20/2018     STUDIES: No results found.  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 Adjuvant chemoradiation therapy completed in July of 2007.  PLAN:    1. Merkel cell: No evidence of disease.  PET scan from November 2014 was negative for any recurrence, no further imaging is necessary.  All of his laboratory work continues to be either negative or within normal limits including an AFP of 2.0.  No intervention is needed at this time.  Although patient is well over 10 years out from completing his treatments, he continues to insist on yearly follow-up for laboratory work.  Return to clinic in 1 year for further evaluation.  2. Hypertension: Patient's blood pressure is significantly elevated today.  Continue treatment and evaluation per primary care.  3. Hyponatremia: Chronic and unchanged.  Patient expressed understanding and was in agreement with this plan. He also understands that He can call clinic at any time with any questions, concerns, or complaints.    Lloyd Huger, MD   11/22/2018 5:57 AM

## 2018-11-20 ENCOUNTER — Encounter: Payer: Self-pay | Admitting: Oncology

## 2018-11-20 ENCOUNTER — Other Ambulatory Visit: Payer: Self-pay

## 2018-11-20 ENCOUNTER — Inpatient Hospital Stay: Payer: Medicare Other | Attending: Oncology

## 2018-11-20 ENCOUNTER — Inpatient Hospital Stay (HOSPITAL_BASED_OUTPATIENT_CLINIC_OR_DEPARTMENT_OTHER): Payer: Medicare Other | Admitting: Oncology

## 2018-11-20 VITALS — BP 181/76 | HR 59 | Temp 98.1°F | Ht 72.0 in | Wt 234.0 lb

## 2018-11-20 DIAGNOSIS — Z8673 Personal history of transient ischemic attack (TIA), and cerebral infarction without residual deficits: Secondary | ICD-10-CM | POA: Diagnosis not present

## 2018-11-20 DIAGNOSIS — R600 Localized edema: Secondary | ICD-10-CM | POA: Diagnosis not present

## 2018-11-20 DIAGNOSIS — I10 Essential (primary) hypertension: Secondary | ICD-10-CM

## 2018-11-20 DIAGNOSIS — Z85828 Personal history of other malignant neoplasm of skin: Secondary | ICD-10-CM | POA: Insufficient documentation

## 2018-11-20 DIAGNOSIS — F319 Bipolar disorder, unspecified: Secondary | ICD-10-CM | POA: Diagnosis not present

## 2018-11-20 DIAGNOSIS — Z79899 Other long term (current) drug therapy: Secondary | ICD-10-CM | POA: Diagnosis not present

## 2018-11-20 DIAGNOSIS — Z87891 Personal history of nicotine dependence: Secondary | ICD-10-CM | POA: Insufficient documentation

## 2018-11-20 DIAGNOSIS — Z85821 Personal history of Merkel cell carcinoma: Secondary | ICD-10-CM | POA: Diagnosis not present

## 2018-11-20 DIAGNOSIS — E871 Hypo-osmolality and hyponatremia: Secondary | ICD-10-CM

## 2018-11-20 DIAGNOSIS — C4A9 Merkel cell carcinoma, unspecified: Secondary | ICD-10-CM

## 2018-11-20 DIAGNOSIS — Z950 Presence of cardiac pacemaker: Secondary | ICD-10-CM | POA: Diagnosis not present

## 2018-11-20 DIAGNOSIS — E782 Mixed hyperlipidemia: Secondary | ICD-10-CM | POA: Diagnosis not present

## 2018-11-20 LAB — CBC WITH DIFFERENTIAL/PLATELET
Abs Immature Granulocytes: 0.02 10*3/uL (ref 0.00–0.07)
Basophils Absolute: 0 10*3/uL (ref 0.0–0.1)
Basophils Relative: 1 %
Eosinophils Absolute: 0.1 10*3/uL (ref 0.0–0.5)
Eosinophils Relative: 1 %
HCT: 37.2 % — ABNORMAL LOW (ref 39.0–52.0)
Hemoglobin: 13.5 g/dL (ref 13.0–17.0)
Immature Granulocytes: 0 %
Lymphocytes Relative: 35 %
Lymphs Abs: 2 10*3/uL (ref 0.7–4.0)
MCH: 32.8 pg (ref 26.0–34.0)
MCHC: 36.3 g/dL — ABNORMAL HIGH (ref 30.0–36.0)
MCV: 90.5 fL (ref 80.0–100.0)
Monocytes Absolute: 0.6 10*3/uL (ref 0.1–1.0)
Monocytes Relative: 11 %
Neutro Abs: 3.1 10*3/uL (ref 1.7–7.7)
Neutrophils Relative %: 52 %
Platelets: 264 10*3/uL (ref 150–400)
RBC: 4.11 MIL/uL — ABNORMAL LOW (ref 4.22–5.81)
RDW: 11.7 % (ref 11.5–15.5)
WBC: 5.9 10*3/uL (ref 4.0–10.5)
nRBC: 0 % (ref 0.0–0.2)

## 2018-11-20 LAB — COMPREHENSIVE METABOLIC PANEL
ALT: 37 U/L (ref 0–44)
AST: 26 U/L (ref 15–41)
Albumin: 4 g/dL (ref 3.5–5.0)
Alkaline Phosphatase: 61 U/L (ref 38–126)
Anion gap: 9 (ref 5–15)
BUN: 18 mg/dL (ref 8–23)
CO2: 24 mmol/L (ref 22–32)
Calcium: 8.4 mg/dL — ABNORMAL LOW (ref 8.9–10.3)
Chloride: 98 mmol/L (ref 98–111)
Creatinine, Ser: 0.46 mg/dL — ABNORMAL LOW (ref 0.61–1.24)
GFR calc Af Amer: >60 mL/min (ref >60)
GFR calc non Af Amer: >60 mL/min (ref >60)
Glucose, Bld: 112 mg/dL — ABNORMAL HIGH (ref 70–99)
Potassium: 4.2 mmol/L (ref 3.5–5.1)
Sodium: 131 mmol/L — ABNORMAL LOW (ref 135–145)
Total Bilirubin: 0.6 mg/dL (ref 0.3–1.2)
Total Protein: 7.1 g/dL (ref 6.5–8.1)

## 2018-11-20 LAB — IRON AND TIBC
Iron: 96 ug/dL (ref 45–182)
Saturation Ratios: 33 % (ref 17.9–39.5)
TIBC: 293 ug/dL (ref 250–450)
UIBC: 197 ug/dL

## 2018-11-20 LAB — FERRITIN: Ferritin: 87 ng/mL (ref 24–336)

## 2018-11-20 NOTE — Progress Notes (Signed)
Patient stated that he had been doing well with no complaints. 

## 2018-11-21 LAB — AFP TUMOR MARKER: AFP, Serum, Tumor Marker: 2 ng/mL (ref 0.0–8.3)

## 2019-04-30 DIAGNOSIS — Z8679 Personal history of other diseases of the circulatory system: Secondary | ICD-10-CM | POA: Insufficient documentation

## 2019-04-30 DIAGNOSIS — Z950 Presence of cardiac pacemaker: Secondary | ICD-10-CM | POA: Insufficient documentation

## 2019-11-20 ENCOUNTER — Ambulatory Visit: Payer: Medicare Other | Admitting: Oncology

## 2019-11-20 ENCOUNTER — Other Ambulatory Visit: Payer: Medicare Other

## 2019-11-26 DIAGNOSIS — G4733 Obstructive sleep apnea (adult) (pediatric): Secondary | ICD-10-CM | POA: Insufficient documentation

## 2019-11-28 ENCOUNTER — Other Ambulatory Visit: Payer: Self-pay

## 2019-11-28 ENCOUNTER — Encounter: Payer: Self-pay | Admitting: Oncology

## 2019-11-28 DIAGNOSIS — Z8572 Personal history of non-Hodgkin lymphomas: Secondary | ICD-10-CM

## 2019-11-28 DIAGNOSIS — C4A9 Merkel cell carcinoma, unspecified: Secondary | ICD-10-CM

## 2019-11-28 NOTE — Progress Notes (Signed)
Patient was called for pre assessment. Denies any pain or concerns at this time.  

## 2019-11-30 NOTE — Progress Notes (Signed)
Douglas Perkins  Telephone:([phone] [phone]  ID: Douglas Perkins. 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  INTERVAL HISTORY: Patient returns to clinic today for repeat laboratory can routine yearly evaluation.  He continues to feel well and remains asymptomatic. He has no neurologic complaints. He denies any recent fevers or illnesses.  He has a good appetite and denies weight loss.  He has no chest pain, shortness of breath, cough, or hemoptysis. He denies any nausea, vomiting, constipation, or diarrhea.  He has no urinary complaints.  He continues to have mild right lower extremity edema, but this is chronic and unchanged since his surgery for Merkel cell.  Patient feels at his baseline offers no specific complaints today.  REVIEW OF SYSTEMS:   Review of Systems  Constitutional: Negative.  Negative for fever, malaise/fatigue and weight loss.  Respiratory: Negative.  Negative for cough and shortness of breath.   Cardiovascular: Negative.  Negative for chest pain and leg swelling.  Gastrointestinal: Negative.  Negative for abdominal pain, blood in stool, constipation, diarrhea, melena, nausea and vomiting.  Musculoskeletal: Negative.  Negative for back pain.  Skin: Negative.  Negative for rash.  Neurological: Negative.  Negative for sensory change, focal weakness and weakness.  Psychiatric/Behavioral: Negative.  Negative for depression. The patient is not nervous/anxious.     As per HPI. Otherwise, a complete review of systems is negative.  PAST MEDICAL HISTORY: Past Medical History:  Diagnosis Date  . Bipolar 1 disorder ([place])   . Cancer (HCC)    merkel cell cancer  . Cancer [facility])    prostate  . Glaucoma   . Hypertension   . 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 Presence of permanent cardiac pacemaker   . TIA (transient ischemic attack)     PAST SURGICAL HISTORY: Past Surgical History:  Procedure Laterality Date  . CHOLECYSTECTOMY    . COLONOSCOPY WITH PROPOFOL N/A 01/31/2017   Procedure: COLONOSCOPY WITH PROPOFOL;  Surgeon: Manya Silvas, MD;  Location: [facility] ENDOSCOPY;  Service: Endoscopy;  Laterality: N/A;  . LEG SURGERY Left    distal  . Pace maker placement    . PROSTATE SURGERY    . PROSTATECTOMY    . SKIN CANCER EXCISION  2006,2007   Merkle Cell Carcinoma    FAMILY HISTORY: Heart disease and bipolar disease.    ADVANCED DIRECTIVES:    HEALTH MAINTENANCE: Social History   Tobacco Use  . Smoking status: Former Research scientist (life sciences)  . Smokeless tobacco: Never Used  Substance Use Topics  . Alcohol use: No    Alcohol/week: 0.0 standard drinks  . Drug use: No     Colonoscopy:  PAP:  Bone density:  Lipid panel:  Allergies  Allergen Reactions  . Other Hives, Shortness Of Breath and Other (See Comments)    Pt states that he is allergic to unwashed blood products.   Other reaction(s): Other (See Comments) UNWASHED BLOOD PRODUCTS  Pt states that he is allergic to unwashed blood products.   . Feldene [Piroxicam] Hives  . Sulfa Antibiotics Hives    Current Outpatient Medications  Medication Sig Dispense Refill  . amLODipine (NORVASC) 10 MG tablet Take by mouth daily.   0  . ARIPiprazole (ABILIFY) 10 MG tablet Take 30 mg by mouth  2 (two) times daily. 10mg -AM/20mg -PM  0  . aspirin EC 81 MG tablet Take 81 mg by mouth daily. Reported on 08/18/2015    . atorvastatin (LIPITOR) 40 MG tablet Take 1 tablet by mouth 1 day or 1 dose.    . carbamazepine (TEGRETOL) 200 MG tablet Take 600 mg by mouth 2 (two) times daily.    . carvedilol (COREG) 25 MG tablet Take 25 mg by mouth in the morning and at bedtime.    . chlorthalidone (HYGROTON) 25 MG tablet Take 1 tablet by mouth daily.  3  . cloNIDine (CATAPRES) 0.1  MG tablet Take 0.1 mg by mouth in the morning, at noon, and at bedtime.    . ferrous sulfate 325 (65 FE) MG tablet Take 325 mg by mouth daily.     Marland Kitchen gabapentin (NEURONTIN) 100 MG capsule Take 2 capsules by mouth 2 (two) times daily.  0  . glucosamine-chondroitin 500-400 MG tablet Take 1 tablet by mouth daily.    . hydrALAZINE (APRESOLINE) 25 MG tablet Take 1 tablet (25 mg total) by mouth 3 (three) times daily. 90 tablet 0  . latanoprost (XALATAN) 0.005 % ophthalmic solution Place 1 drop into both eyes 2 (two) times daily.     Marland Kitchen lisinopril (PRINIVIL,ZESTRIL) 40 MG tablet Take 40 mg by mouth 2 (two) times daily.     . Multiple Vitamin (MULTIVITAMIN WITH MINERALS) TABS tablet Take 1 tablet by mouth daily.    . Omega-3 Fatty Acids (FISH OIL) 1000 MG CAPS Take 1,000 mg by mouth 2 (two) times daily.     . QUEtiapine (SEROQUEL) 300 MG tablet Take 450 mg by mouth at bedtime.     . sildenafil (REVATIO) 20 MG tablet Take 1 tablet by mouth 1 day or 1 dose.    . spironolactone (ALDACTONE) 25 MG tablet Take 25 mg by mouth daily.    . temazepam (RESTORIL) 15 MG capsule Take 15 mg by mouth at bedtime as needed for sleep.    Marland Kitchen timolol (BETIMOL) 0.5 % ophthalmic solution Place 1 drop into the right eye daily.     No current facility-administered medications for this visit.    OBJECTIVE: Vitals:   11/28/19 1617  BP: (!) 160/76  Pulse: 70  Resp: 18  Temp: 97.6 F (36.4 C)  SpO2: 97%     Perkins mass index is 32.83 kg/m.    ECOG FS:0 - Asymptomatic  General: Well-developed, well-nourished, no acute distress. Eyes: Pink conjunctiva, anicteric sclera. HEENT: Normocephalic, moist mucous membranes. Lungs: No audible wheezing or coughing. Heart: Regular rate and rhythm. Abdomen: Soft, nontender, no obvious distention. Musculoskeletal: No edema, cyanosis, or clubbing. Neuro: Alert, answering all questions appropriately. Cranial nerves grossly intact. Skin: No rashes or petechiae noted. Psych: Normal  affect.  LAB RESULTS:  Lab Results  Component Value Date   NA 135 12/01/2019   K 4.5 12/01/2019   CL 99 12/01/2019   CO2 28 12/01/2019   GLUCOSE 122 (H) 12/01/2019   BUN 25 (H) 12/01/2019   CREATININE 0.78 12/01/2019   CALCIUM 8.8 (L) 12/01/2019   PROT 7.6 12/01/2019   ALBUMIN 4.4 12/01/2019   AST 31 12/01/2019   ALT 47 (H) 12/01/2019   ALKPHOS 65 12/01/2019   BILITOT 0.6 12/01/2019   GFRNONAA >60 12/01/2019   GFRAA >60 12/01/2019    Lab Results  Component Value Date   WBC 8.3 12/01/2019   NEUTROABS 4.5 12/01/2019   HGB 13.9 12/01/2019   HCT 38.8 (L) 12/01/2019  MCV 92.4 12/01/2019   PLT 291 12/01/2019     STUDIES: No results found.  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 Adjuvant chemoradiation therapy completed in July of 2007.  PLAN:    1. Merkel cell: No evidence of disease.  PET scan from November 2014 was negative for any recurrence, no further imaging is necessary.  All of his laboratory work continues to be either negative or within normal limits including an AFP of 2.0.  His most recent AFP is pending at time of dictation.  No intervention is needed at this time.  Although patient is well over 10 years out from completing his treatments, he continues to insist on yearly follow-up for laboratory work.  Return to clinic in 1 year for further evaluation. 2. Hypertension: Chronic and unchanged.  Continue treatment and evaluation per primary care.  3. Hyponatremia: Resolved. 4.  Elevated ALT: Mild, monitor.  Patient expressed understanding and was in agreement with this plan. He also understands that He can call clinic at any time with any questions, concerns, or complaints.    Lloyd Huger, MD   12/02/2019 6:27 AM

## 2019-12-01 ENCOUNTER — Inpatient Hospital Stay: Payer: Medicare Other

## 2019-12-01 ENCOUNTER — Inpatient Hospital Stay: Payer: Medicare Other | Attending: Oncology | Admitting: Oncology

## 2019-12-01 ENCOUNTER — Other Ambulatory Visit: Payer: Self-pay

## 2019-12-01 VITALS — BP 160/76 | HR 70 | Temp 97.6°F | Resp 18 | Wt 242.1 lb

## 2019-12-01 DIAGNOSIS — Z79899 Other long term (current) drug therapy: Secondary | ICD-10-CM | POA: Diagnosis not present

## 2019-12-01 DIAGNOSIS — R7401 Elevation of levels of liver transaminase levels: Secondary | ICD-10-CM | POA: Diagnosis not present

## 2019-12-01 DIAGNOSIS — Z85821 Personal history of Merkel cell carcinoma: Secondary | ICD-10-CM | POA: Insufficient documentation

## 2019-12-01 DIAGNOSIS — C4A9 Merkel cell carcinoma, unspecified: Secondary | ICD-10-CM

## 2019-12-01 DIAGNOSIS — F319 Bipolar disorder, unspecified: Secondary | ICD-10-CM | POA: Diagnosis not present

## 2019-12-01 DIAGNOSIS — I10 Essential (primary) hypertension: Secondary | ICD-10-CM | POA: Insufficient documentation

## 2019-12-01 LAB — COMPREHENSIVE METABOLIC PANEL
ALT: 47 U/L — ABNORMAL HIGH (ref 0–44)
AST: 31 U/L (ref 15–41)
Albumin: 4.4 g/dL (ref 3.5–5.0)
Alkaline Phosphatase: 65 U/L (ref 38–126)
Anion gap: 8 (ref 5–15)
BUN: 25 mg/dL — ABNORMAL HIGH (ref 8–23)
CO2: 28 mmol/L (ref 22–32)
Calcium: 8.8 mg/dL — ABNORMAL LOW (ref 8.9–10.3)
Chloride: 99 mmol/L (ref 98–111)
Creatinine, Ser: 0.78 mg/dL (ref 0.61–1.24)
GFR calc Af Amer: >60 mL/min (ref >60)
GFR calc non Af Amer: >60 mL/min (ref >60)
Glucose, Bld: 122 mg/dL — ABNORMAL HIGH (ref 70–99)
Potassium: 4.5 mmol/L (ref 3.5–5.1)
Sodium: 135 mmol/L (ref 135–145)
Total Bilirubin: 0.6 mg/dL (ref 0.3–1.2)
Total Protein: 7.6 g/dL (ref 6.5–8.1)

## 2019-12-01 LAB — CBC WITH DIFFERENTIAL/PLATELET
Abs Immature Granulocytes: 0.04 10*3/uL (ref 0.00–0.07)
Basophils Absolute: 0 10*3/uL (ref 0.0–0.1)
Basophils Relative: 1 %
Eosinophils Absolute: 0.2 10*3/uL (ref 0.0–0.5)
Eosinophils Relative: 2 %
HCT: 38.8 % — ABNORMAL LOW (ref 39.0–52.0)
Hemoglobin: 13.9 g/dL (ref 13.0–17.0)
Immature Granulocytes: 1 %
Lymphocytes Relative: 31 %
Lymphs Abs: 2.5 10*3/uL (ref 0.7–4.0)
MCH: 33.1 pg (ref 26.0–34.0)
MCHC: 35.8 g/dL (ref 30.0–36.0)
MCV: 92.4 fL (ref 80.0–100.0)
Monocytes Absolute: 0.9 10*3/uL (ref 0.1–1.0)
Monocytes Relative: 11 %
Neutro Abs: 4.5 10*3/uL (ref 1.7–7.7)
Neutrophils Relative %: 54 %
Platelets: 291 10*3/uL (ref 150–400)
RBC: 4.2 MIL/uL — ABNORMAL LOW (ref 4.22–5.81)
RDW: 11.9 % (ref 11.5–15.5)
WBC: 8.3 10*3/uL (ref 4.0–10.5)
nRBC: 0 % (ref 0.0–0.2)

## 2019-12-01 LAB — FERRITIN: Ferritin: 127 ng/mL (ref 24–336)

## 2019-12-01 LAB — IRON AND TIBC
Iron: 110 ug/dL (ref 45–182)
Saturation Ratios: 39 % (ref 17.9–39.5)
TIBC: 284 ug/dL (ref 250–450)
UIBC: 174 ug/dL

## 2019-12-10 ENCOUNTER — Other Ambulatory Visit (INDEPENDENT_AMBULATORY_CARE_PROVIDER_SITE_OTHER): Payer: Self-pay | Admitting: Vascular Surgery

## 2019-12-10 DIAGNOSIS — I10 Essential (primary) hypertension: Secondary | ICD-10-CM

## 2019-12-11 ENCOUNTER — Ambulatory Visit (INDEPENDENT_AMBULATORY_CARE_PROVIDER_SITE_OTHER): Payer: Medicare Other

## 2019-12-11 ENCOUNTER — Ambulatory Visit (INDEPENDENT_AMBULATORY_CARE_PROVIDER_SITE_OTHER): Payer: Medicare Other | Admitting: Vascular Surgery

## 2019-12-11 ENCOUNTER — Encounter (INDEPENDENT_AMBULATORY_CARE_PROVIDER_SITE_OTHER): Payer: Self-pay | Admitting: Vascular Surgery

## 2019-12-11 ENCOUNTER — Other Ambulatory Visit: Payer: Self-pay

## 2019-12-11 VITALS — BP 125/71 | HR 66 | Resp 16 | Ht 72.0 in | Wt 238.0 lb

## 2019-12-11 DIAGNOSIS — E782 Mixed hyperlipidemia: Secondary | ICD-10-CM | POA: Diagnosis not present

## 2019-12-11 DIAGNOSIS — I25118 Atherosclerotic heart disease of native coronary artery with other forms of angina pectoris: Secondary | ICD-10-CM

## 2019-12-11 DIAGNOSIS — I10 Essential (primary) hypertension: Secondary | ICD-10-CM | POA: Diagnosis not present

## 2019-12-11 NOTE — Progress Notes (Signed)
MRN : [id number]  Douglas Perkins. is a 74 y.o. (July 27, 1945) male who presents with chief complaint of  Chief Complaint  Patient presents with  . New Patient (Initial Visit)    ref [place] renal consult  .  History of Present Illness:   The patient is seen for evaluation of malignant hypertension which has been very difficult to control. The patient has a long history of hypertension which recently has become increasingly difficult to control utilizing medical therapy. The patient is consistently documented systolic blood pressures near 169 with diastolic pressures over 90.   The patient does have family history of hypertension.   There is no prior documented abdominal bruit. The patient occasionally has flushing symptoms but denies palpitations. No episodes of syncope.There is no history of headache. There is no history of flash pulmonary edema.  The patient denies a history of renal disease.  The patient denies amaurosis fugax or recent TIA symptoms. There are no recent neurological changes noted. The patient denies claudication symptoms or rest pain symptoms. The patient denies history of DVT, PE or superficial thrombophlebitis. The patient denies recent episodes of angina or shortness of breath.   Duplex ultrasound of the renal arteries demonstrates minimal ASO of the renal arteries with normal size kidney's    Current Meds  Medication Sig  . amLODipine (NORVASC) 10 MG tablet Take by mouth daily.   . ARIPiprazole (ABILIFY) 10 MG tablet Take 30 mg by mouth 2 (two) times daily. 10mg -AM/20mg -PM  . aspirin EC 81 MG tablet Take 81 mg by mouth daily. Reported on 08/18/2015  . atorvastatin (LIPITOR) 40 MG tablet Take 1 tablet by mouth 1 day or 1 dose.  . carbamazepine (TEGRETOL) 200 MG tablet Take 600 mg by mouth 2 (two) times daily.  . carvedilol (COREG) 25 MG tablet Take 25 mg by mouth in the morning and at bedtime.  . chlorthalidone (HYGROTON) 25 MG tablet Take 1  tablet by mouth daily.  . cloNIDine (CATAPRES) 0.1 MG tablet Take 0.1 mg by mouth in the morning, at noon, and at bedtime.  . ferrous sulfate 325 (65 FE) MG tablet Take 325 mg by mouth daily.   Marland Kitchen gabapentin (NEURONTIN) 100 MG capsule Take 2 capsules by mouth 2 (two) times daily.  Marland Kitchen glucosamine-chondroitin 500-400 MG tablet Take 1 tablet by mouth daily.  . hydrALAZINE (APRESOLINE) 25 MG tablet Take 1 tablet (25 mg total) by mouth 3 (three) times daily.  Marland Kitchen latanoprost (XALATAN) 0.005 % ophthalmic solution Place 1 drop into both eyes 2 (two) times daily.   Marland Kitchen lisinopril (PRINIVIL,ZESTRIL) 40 MG tablet Take 40 mg by mouth 2 (two) times daily.   . Multiple Vitamin (MULTIVITAMIN WITH MINERALS) TABS tablet Take 1 tablet by mouth daily.  . Omega-3 Fatty Acids (FISH OIL) 1000 MG CAPS Take 1,000 mg by mouth 2 (two) times daily.   . QUEtiapine (SEROQUEL) 300 MG tablet Take 450 mg by mouth at bedtime.   . sildenafil (REVATIO) 20 MG tablet Take 1 tablet by mouth 1 day or 1 dose.  . spironolactone (ALDACTONE) 25 MG tablet Take 25 mg by mouth daily.  . temazepam (RESTORIL) 15 MG capsule Take 15 mg by mouth at bedtime as needed for sleep.  Marland Kitchen timolol (BETIMOL) 0.5 % ophthalmic solution Place 1 drop into the right eye daily.    Past Medical History:  Diagnosis Date  . Bipolar 1 disorder ([place])   . Cancer (HCC)    merkel cell cancer  .  Cancer [facility])    prostate  . Glaucoma   . Hypertension   . 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 Presence of permanent cardiac pacemaker   . TIA (transient ischemic attack)     Past Surgical History:  Procedure Laterality Date  . CHOLECYSTECTOMY    . COLONOSCOPY WITH PROPOFOL N/A 01/31/2017   Procedure: COLONOSCOPY WITH PROPOFOL;  Surgeon: Manya Silvas, MD;  Location: [facility] ENDOSCOPY;  Service: Endoscopy;  Laterality: N/A;  . LEG SURGERY Left    distal  . Pace maker placement    . PROSTATE SURGERY    . PROSTATECTOMY    . SKIN CANCER  EXCISION  0175,1025   Merkle Cell Carcinoma    Social History Social History   Tobacco Use  . Smoking status: Former Research scientist (life sciences)  . Smokeless tobacco: Never Used  Substance Use Topics  . Alcohol use: No    Alcohol/week: 0.0 standard drinks  . Drug use: No    Family History Family History  Problem Relation Age of Onset  . Stroke Father   . CAD Paternal Grandmother   . CAD Paternal Grandfather   No family history of bleeding/clotting disorders, porphyria or autoimmune disease   Allergies  Allergen Reactions  . Other Hives, Shortness Of Breath and Other (See Comments)    Pt states that he is allergic to unwashed blood products.   Other reaction(s): Other (See Comments) UNWASHED BLOOD PRODUCTS  Pt states that he is allergic to unwashed blood products.   . Feldene [Piroxicam] Hives  . Sulfa Antibiotics Hives     REVIEW OF SYSTEMS (Negative unless checked)  Constitutional: [] Weight loss  [] Fever  [] Chills Cardiac: [] Chest pain   [] Chest pressure   [] Palpitations   [] Shortness of breath when laying flat   [] Shortness of breath with exertion. Vascular:  [] Pain in legs with walking   [] Pain in legs at rest  [] History of DVT   [] Phlebitis   [] Swelling in legs   [] Varicose veins   [] Non-healing ulcers Pulmonary:   [] Uses home oxygen   [] Productive cough   [] Hemoptysis   [] Wheeze  [] COPD   [] Asthma Neurologic:  [] Dizziness   [] Seizures   [] History of stroke   [] History of TIA  [] Aphasia   [] Vissual changes   [] Weakness or numbness in arm   [] Weakness or numbness in leg Musculoskeletal:   [] Joint swelling   [] Joint pain   [] Low back pain Hematologic:  [] Easy bruising  [] Easy bleeding   [] Hypercoagulable state   [] Anemic Gastrointestinal:  [] Diarrhea   [] Vomiting  [] Gastroesophageal reflux/heartburn   [] Difficulty swallowing. Genitourinary:  [] Chronic kidney disease   [] Difficult urination  [] Frequent urination   [] Blood in urine Skin:  [] Rashes   [] Ulcers  Psychological:  [] History of  anxiety   []  History of major depression.  Physical Examination  Vitals:   12/11/19 0837  BP: 125/71  Pulse: 66  Resp: 16  Weight: 238 lb (108 kg)  Height: 6' (1.829 m)   Body mass index is 32.28 kg/m. Gen: WD/WN, NAD Head: [place]/AT, No temporalis wasting.  Ear/Nose/Throat: Hearing grossly intact, nares w/o erythema or drainage, poor dentition Eyes: PER, EOMI, sclera nonicteric.  Neck: Supple, no masses.  No bruit or JVD.  Pulmonary:  Good air movement, clear to auscultation bilaterally, no use of accessory muscles.  Cardiac: RRR, normal S1, S2, no Murmurs. Vascular:  No abdominal or flank bruits Vessel Right Left  Radial Palpable Palpable  Gastrointestinal: soft, non-distended.  No guarding/no peritoneal signs.  Musculoskeletal: M/S 5/5 throughout.  No deformity or atrophy.  Neurologic: CN 2-12 intact. Pain and light touch intact in extremities.  Symmetrical.  Speech is fluent. Motor exam as listed above. Psychiatric: Judgment intact, Mood & affect appropriate for pt's clinical situation. Dermatologic: No rashes or ulcers noted.  No changes consistent with cellulitis.  CBC Lab Results  Component Value Date   WBC 8.3 12/01/2019   HGB 13.9 12/01/2019   HCT 38.8 (L) 12/01/2019   MCV 92.4 12/01/2019   PLT 291 12/01/2019    BMET    Component Value Date/Time   NA 135 12/01/2019 0949   NA 131 (L) 08/17/2014 1444   K 4.5 12/01/2019 0949   K 4.3 08/17/2014 1444   CL 99 12/01/2019 0949   CL 94 (L) 08/17/2014 1444   CO2 28 12/01/2019 0949   CO2 32 08/17/2014 1444   GLUCOSE 122 (H) 12/01/2019 0949   GLUCOSE 103 (H) 08/17/2014 1444   BUN 25 (H) 12/01/2019 0949   BUN 13 08/17/2014 1444   CREATININE 0.78 12/01/2019 0949   CREATININE 0.59 (L) 08/17/2014 1444   CALCIUM 8.8 (L) 12/01/2019 0949   CALCIUM 8.5 (L) 08/17/2014 1444   GFRNONAA >60 12/01/2019 0949   GFRNONAA >60 08/17/2014 1444   GFRAA >60 12/01/2019 0949   GFRAA >60 08/17/2014 1444   Estimated Creatinine  Clearance: 101.3 mL/min (by C-G formula based on SCr of 0.78 mg/dL).  COAG Lab Results  Component Value Date   INR 0.93 09/16/2017   INR 1.18 07/19/2015   INR 1.31 06/24/2015    Radiology No results found.    Assessment/Plan 1. Benign essential hypertension Given patient's arterial disease optimal control of the patient's hypertension is important.  The patient's vital signs and noninvasive studies support that renal artery stenosis is not significant.  No invasive studies or intervention is indicated at this time.  The patient will continue the current antihypertensive medications, no changes at this time.  The primary medical service will continue aggressive antihypertensive therapy as per the AHA guidelines   He will follow up with me PRN  2. Coronary artery disease of native artery of native heart with stable angina pectoris (HCC) Continue cardiac and antihypertensive medications as already ordered and reviewed, no changes at this time.  Continue statin as ordered and reviewed, no changes at this time  Nitrates PRN for chest pain   3. Hyperlipidemia, mixed Continue statin as ordered and reviewed, no changes at this time    Hortencia Pilar, MD  12/11/2019 8:54 AM

## 2020-05-06 ENCOUNTER — Other Ambulatory Visit: Payer: Self-pay | Admitting: Otolaryngology

## 2020-05-07 ENCOUNTER — Other Ambulatory Visit: Payer: Self-pay

## 2020-05-07 ENCOUNTER — Encounter (HOSPITAL_BASED_OUTPATIENT_CLINIC_OR_DEPARTMENT_OTHER): Payer: Self-pay | Admitting: Otolaryngology

## 2020-05-08 ENCOUNTER — Other Ambulatory Visit (HOSPITAL_COMMUNITY)
Admission: RE | Admit: 2020-05-08 | Discharge: 2020-05-08 | Disposition: A | Discharge status: Home / Self Care | Payer: Medicare Other | Source: Ambulatory Visit | Attending: Otolaryngology | Admitting: Otolaryngology

## 2020-05-08 DIAGNOSIS — Z01812 Encounter for preprocedural laboratory examination: Secondary | ICD-10-CM | POA: Diagnosis present

## 2020-05-08 DIAGNOSIS — Z20822 Contact with and (suspected) exposure to covid-19: Secondary | ICD-10-CM | POA: Insufficient documentation

## 2020-05-09 LAB — SARS CORONAVIRUS 2 (TAT 6-24 HRS): SARS Coronavirus 2: NEGATIVE

## 2020-05-12 ENCOUNTER — Ambulatory Visit (HOSPITAL_BASED_OUTPATIENT_CLINIC_OR_DEPARTMENT_OTHER): Payer: Medicare Other | Admitting: Anesthesiology

## 2020-05-12 ENCOUNTER — Other Ambulatory Visit: Payer: Self-pay

## 2020-05-12 ENCOUNTER — Ambulatory Visit (HOSPITAL_BASED_OUTPATIENT_CLINIC_OR_DEPARTMENT_OTHER)
Admission: RE | Admit: 2020-05-12 | Discharge: 2020-05-12 | Disposition: A | Discharge status: Home / Self Care | Payer: Medicare Other | Attending: Otolaryngology | Admitting: Otolaryngology

## 2020-05-12 ENCOUNTER — Encounter (HOSPITAL_BASED_OUTPATIENT_CLINIC_OR_DEPARTMENT_OTHER): Payer: Self-pay | Admitting: Otolaryngology

## 2020-05-12 ENCOUNTER — Encounter (HOSPITAL_BASED_OUTPATIENT_CLINIC_OR_DEPARTMENT_OTHER)
Admission: RE | Disposition: A | Discharge status: Home / Self Care | Payer: Self-pay | Source: Home / Self Care | Attending: Otolaryngology

## 2020-05-12 DIAGNOSIS — Z8673 Personal history of transient ischemic attack (TIA), and cerebral infarction without residual deficits: Secondary | ICD-10-CM | POA: Diagnosis not present

## 2020-05-12 DIAGNOSIS — Z7982 Long term (current) use of aspirin: Secondary | ICD-10-CM | POA: Insufficient documentation

## 2020-05-12 DIAGNOSIS — Z8249 Family history of ischemic heart disease and other diseases of the circulatory system: Secondary | ICD-10-CM | POA: Insufficient documentation

## 2020-05-12 DIAGNOSIS — Z79899 Other long term (current) drug therapy: Secondary | ICD-10-CM | POA: Insufficient documentation

## 2020-05-12 DIAGNOSIS — Z8546 Personal history of malignant neoplasm of prostate: Secondary | ICD-10-CM | POA: Insufficient documentation

## 2020-05-12 DIAGNOSIS — Z882 Allergy status to sulfonamides status: Secondary | ICD-10-CM | POA: Insufficient documentation

## 2020-05-12 DIAGNOSIS — Z823 Family history of stroke: Secondary | ICD-10-CM | POA: Insufficient documentation

## 2020-05-12 DIAGNOSIS — Z950 Presence of cardiac pacemaker: Secondary | ICD-10-CM | POA: Insufficient documentation

## 2020-05-12 DIAGNOSIS — Z85821 Personal history of Merkel cell carcinoma: Secondary | ICD-10-CM | POA: Insufficient documentation

## 2020-05-12 DIAGNOSIS — Z888 Allergy status to other drugs, medicaments and biological substances status: Secondary | ICD-10-CM | POA: Diagnosis not present

## 2020-05-12 DIAGNOSIS — G4733 Obstructive sleep apnea (adult) (pediatric): Secondary | ICD-10-CM | POA: Diagnosis not present

## 2020-05-12 DIAGNOSIS — Z8579 Personal history of other malignant neoplasms of lymphoid, hematopoietic and related tissues: Secondary | ICD-10-CM | POA: Insufficient documentation

## 2020-05-12 DIAGNOSIS — Z87891 Personal history of nicotine dependence: Secondary | ICD-10-CM | POA: Diagnosis not present

## 2020-05-12 HISTORY — DX: Sleep apnea, unspecified: G47.30

## 2020-05-12 HISTORY — PX: DRUG INDUCED ENDOSCOPY: SHX6808

## 2020-05-12 SURGERY — DRUG INDUCED SLEEP ENDOSCOPY
Anesthesia: General | Site: Nose

## 2020-05-12 MED ORDER — LACTATED RINGERS IV SOLN: INTRAVENOUS | Status: DC

## 2020-05-12 MED ORDER — LIDOCAINE 2% (20 MG/ML) 5 ML SYRINGE
INTRAMUSCULAR | Status: DC | PRN
  Administered 2020-05-12: 60 mg via INTRAVENOUS

## 2020-05-12 MED ORDER — ACETAMINOPHEN 325 MG PO TABS: 325.0000 mg | ORAL_TABLET | ORAL | Status: DC | PRN

## 2020-05-12 MED ORDER — OXYMETAZOLINE HCL 0.05 % NA SOLN
NASAL | Status: DC | PRN
  Administered 2020-05-12: 1 via TOPICAL

## 2020-05-12 MED ORDER — ACETAMINOPHEN 160 MG/5ML PO SOLN: 325.0000 mg | ORAL | Status: DC | PRN

## 2020-05-12 MED ORDER — PROPOFOL 500 MG/50ML IV EMUL
INTRAVENOUS | Status: DC | PRN
  Administered 2020-05-12: 25 ug/kg/min via INTRAVENOUS

## 2020-05-12 MED ORDER — MEPERIDINE HCL 25 MG/ML IJ SOLN: 6.2500 mg | INTRAMUSCULAR | Status: DC | PRN

## 2020-05-12 MED ORDER — ONDANSETRON HCL 4 MG/2ML IJ SOLN: 4.0000 mg | Freq: Once | INTRAMUSCULAR | Status: DC | PRN

## 2020-05-12 MED ORDER — OXYCODONE HCL 5 MG PO TABS: 5.0000 mg | ORAL_TABLET | Freq: Once | ORAL | Status: DC | PRN

## 2020-05-12 MED ORDER — FENTANYL CITRATE (PF) 100 MCG/2ML IJ SOLN: 25.0000 ug | INTRAMUSCULAR | Status: DC | PRN

## 2020-05-12 MED ORDER — ONDANSETRON HCL 4 MG/2ML IJ SOLN
INTRAMUSCULAR | Status: DC | PRN
  Administered 2020-05-12: 4 mg via INTRAVENOUS

## 2020-05-12 MED ORDER — OXYCODONE HCL 5 MG/5ML PO SOLN: 5.0000 mg | Freq: Once | ORAL | Status: DC | PRN

## 2020-05-12 SURGICAL SUPPLY — 15 items
CANISTER SUCT 1200ML W/VALVE (MISCELLANEOUS) ×2 IMPLANT
COVER WAND RF STERILE (DRAPES) IMPLANT
GLOVE BIO SURGEON STRL SZ7.5 (GLOVE) ×2 IMPLANT
GLOVE SURG SS PI 7.0 STRL IVOR (GLOVE) ×2 IMPLANT
GLOVE SURG UNDER POLY LF SZ7 (GLOVE) ×2 IMPLANT
KIT CLEAN ENDO (MISCELLANEOUS) ×2 IMPLANT
NEEDLE PRECISIONGLIDE 27X1.5 (NEEDLE) IMPLANT
PACK BASIN DAY SURGERY FS (CUSTOM PROCEDURE TRAY) ×2 IMPLANT
PATTIES SURGICAL .5 X3 (DISPOSABLE) ×2 IMPLANT
SHEET MEDIUM DRAPE 40X70 STRL (DRAPES) IMPLANT
SOL ANTI FOG 6CC (MISCELLANEOUS) ×1 IMPLANT
SOLUTION ANTI FOG 6CC (MISCELLANEOUS) ×1
SYR CONTROL 10ML LL (SYRINGE) IMPLANT
TOWEL GREEN STERILE FF (TOWEL DISPOSABLE) ×2 IMPLANT
TUBE CONNECTING 20X1/4 (TUBING) ×2 IMPLANT

## 2020-05-12 NOTE — Anesthesia Procedure Notes (Signed)
Procedure Name: MAC Date/Time: 05/12/2020 11:37 AM Performed by: Signe Colt, CRNA Pre-anesthesia Checklist: Patient identified, Emergency Drugs available, Suction available, Patient being monitored and Timeout performed Patient Re-evaluated:Patient Re-evaluated prior to induction Oxygen Delivery Method: Simple face mask

## 2020-05-12 NOTE — Brief Op Note (Signed)
05/12/2020  11:44 AM  PATIENT:  Douglas Perkins.  75 y.o. male  PRE-OPERATIVE DIAGNOSIS:  obstructive sleep apnea.  POST-OPERATIVE DIAGNOSIS:  same  PROCEDURE:  Procedure(s): DRUG INDUCED ENDOSCOPY (N/A)  SURGEON:  Surgeon(s) and Role:    * Melida Quitter, MD - Primary  PHYSICIAN ASSISTANT:   ASSISTANTS: none   ANESTHESIA:   IV sedation  EBL:  None  BLOOD ADMINISTERED:none  DRAINS: none   LOCAL MEDICATIONS USED:  NONE  SPECIMEN:  No Specimen  DISPOSITION OF SPECIMEN:  N/A  COUNTS:  YES  TOURNIQUET:  * No tourniquets in log *  DICTATION: .Note written in EPIC  PLAN OF CARE: Discharge to home after PACU  PATIENT DISPOSITION:  PACU - hemodynamically stable.   Delay start of Pharmacological VTE agent (>24hrs) due to surgical blood loss or risk of bleeding: no

## 2020-05-12 NOTE — Anesthesia Preprocedure Evaluation (Addendum)
Anesthesia Evaluation  Patient identified by MRN, date of birth, ID band  Reviewed: Allergy & Precautions, H&P , NPO status , Patient's Chart, lab work & pertinent test results  Airway Mallampati: III  TM Distance: <3 FB     Dental  (+) Caps, Chipped   Pulmonary sleep apnea , pneumonia, resolved, former smoker,    Pulmonary exam normal        Cardiovascular hypertension, Pt. on medications Normal cardiovascular exam+ pacemaker      Neuro/Psych PSYCHIATRIC DISORDERS Bipolar Disorder TIA   GI/Hepatic Neg liver ROS, Colon polyps   Endo/Other  negative endocrine ROS  Renal/GU negative Renal ROS  negative genitourinary   Musculoskeletal negative musculoskeletal ROS (+)   Abdominal (+) + obese,   Peds negative pediatric ROS (+)  Hematology Hx of lymphoma   Anesthesia Other Findings   Reproductive/Obstetrics                             Anesthesia Physical  Anesthesia Plan  ASA: III  Anesthesia Plan: General   Post-op Pain Management:    Induction: Intravenous  PONV Risk Score and Plan: 2 and TIVA  Airway Management Planned: Nasal Cannula, Natural Airway and Simple Face Mask  Additional Equipment:   Intra-op Plan:   Post-operative Plan:   Informed Consent: I have reviewed the patients History and Physical, chart, labs and discussed the procedure including the risks, benefits and alternatives for the proposed anesthesia with the patient or authorized representative who has indicated his/her understanding and acceptance.     Dental advisory given  Plan Discussed with: CRNA, Surgeon and Anesthesiologist  Anesthesia Plan Comments:         Anesthesia Quick Evaluation

## 2020-05-12 NOTE — Transfer of Care (Signed)
Immediate Anesthesia Transfer of Care Note  Patient: Douglas Perkins.  Procedure(s) Performed: DRUG INDUCED ENDOSCOPY (N/A Nose)  Patient Location: PACU  Anesthesia Type:MAC  Level of Consciousness: awake, alert , oriented and patient cooperative  Airway & Oxygen Therapy: Patient Spontanous Breathing and Patient connected to face mask oxygen  Post-op Assessment: Report given to RN and Post -op Vital signs reviewed and stable  Post vital signs: Reviewed and stable  Last Vitals:  Vitals Value Taken Time  BP 109/62 05/12/20 1148  Temp    Pulse 64 05/12/20 1149  Resp 21 05/12/20 1149  SpO2 97 % 05/12/20 1149  Vitals shown include unvalidated device data.  Last Pain:  Vitals:   05/12/20 0952  TempSrc: Oral  PainSc: 0-No pain         Complications: No complications documented.

## 2020-05-12 NOTE — H&P (Signed)
Douglas Perkins. is an 75 y.o. male.   Chief Complaint: Sleep apnea HPI: 75 year old male with obstructive sleep apnea who has been unable to tolerate CPAP presents for sleep endoscopy.  Past Medical History:  Diagnosis Date  . Bipolar 1 disorder ([place])   . Cancer (HCC)    merkel cell cancer  . Cancer [facility])    prostate  . Glaucoma   . Hypertension   . 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 Sleep apnea    does not use CPAP  . TIA (transient ischemic attack)     Past Surgical History:  Procedure Laterality Date  . CHOLECYSTECTOMY    . COLONOSCOPY WITH PROPOFOL N/A 01/31/2017   Procedure: COLONOSCOPY WITH PROPOFOL;  Surgeon: Manya Silvas, MD;  Location: [facility] ENDOSCOPY;  Service: Endoscopy;  Laterality: N/A;  . LEG SURGERY Left    distal  . Pace maker placement    . PROSTATE SURGERY    . PROSTATECTOMY    . SKIN CANCER EXCISION  2006,2007   Merkle Cell Carcinoma    Family History  Problem Relation Age of Onset  . Stroke Father   . CAD Paternal Grandmother   . CAD Paternal Grandfather    Social History:  reports that he has quit smoking. He has never used smokeless tobacco. He reports that he does not drink alcohol and does not use drugs.  Allergies:  Allergies  Allergen Reactions  . Other Hives, Shortness Of Breath and Other (See Comments)    Pt states that he is allergic to unwashed blood products.   Other reaction(s): Other (See Comments) UNWASHED BLOOD PRODUCTS  Pt states that he is allergic to unwashed blood products.   . Feldene [Piroxicam] Hives  . Sulfa Antibiotics Hives    Medications Prior to Admission  Medication Sig Dispense Refill  . amLODipine (NORVASC) 10 MG tablet Take by mouth daily.   0  . aspirin EC 81 MG tablet Take 81 mg by mouth daily. Reported on 08/18/2015    . atorvastatin (LIPITOR) 40 MG tablet Take 1 tablet by mouth 1 day or 1 dose.     . carbamazepine (TEGRETOL) 200 MG tablet Take 400 mg by mouth 2 (two) times daily.    . carvedilol (COREG) 25 MG tablet Take 25 mg by mouth in the morning and at bedtime.    . chlorthalidone (HYGROTON) 25 MG tablet Take 1 tablet by mouth daily.  3  . cloNIDine (CATAPRES) 0.1 MG tablet Take 0.1 mg by mouth in the morning, at noon, and at bedtime.    . ferrous sulfate 325 (65 FE) MG tablet Take 325 mg by mouth daily.     Marland Kitchen gabapentin (NEURONTIN) 100 MG capsule Take 2 capsules by mouth 2 (two) times daily.  0  . glucosamine-chondroitin 500-400 MG tablet Take 1 tablet by mouth daily.    . hydrALAZINE (APRESOLINE) 25 MG tablet Take 1 tablet (25 mg total) by mouth 3 (three) times daily. 90 tablet 0  . latanoprost (XALATAN) 0.005 % ophthalmic solution Place 1 drop into the right eye 2 (two) times daily.    Marland Kitchen lisinopril (PRINIVIL,ZESTRIL) 40 MG tablet Take 40 mg by mouth 2 (two) times daily.     . Multiple Vitamin (MULTIVITAMIN WITH MINERALS) TABS tablet Take 1 tablet by mouth daily.    . Omega-3 Fatty Acids (FISH OIL)  1000 MG CAPS Take 1,000 mg by mouth 2 (two) times daily.     . QUEtiapine (SEROQUEL) 300 MG tablet Take 600 mg by mouth at bedtime.    . sildenafil (REVATIO) 20 MG tablet Take 1 tablet by mouth 1 day or 1 dose.    . spironolactone (ALDACTONE) 25 MG tablet Take 25 mg by mouth daily.    . temazepam (RESTORIL) 15 MG capsule Take 15 mg by mouth at bedtime as needed for sleep.      No results found for this or any previous visit (from the past 48 hour(s)). No results found.  Review of Systems  All other systems reviewed and are negative.   Blood pressure 125/65, pulse 67, temperature 98.4 F (36.9 C), temperature source Oral, resp. rate 18, height 6' (1.829 m), weight 105 kg, SpO2 99 %. Physical Exam Constitutional:      Appearance: Normal appearance.  HENT:     Head: Normocephalic and atraumatic.     Right Ear: External ear normal.     Left Ear: External ear normal.     Nose:  Nose normal.     Mouth/Throat:     Mouth: Mucous membranes are moist.     Pharynx: Oropharynx is clear.  Eyes:     Extraocular Movements: Extraocular movements intact.     Conjunctiva/sclera: Conjunctivae normal.     Pupils: Pupils are equal, round, and reactive to light.  Cardiovascular:     Rate and Rhythm: Normal rate.  Pulmonary:     Effort: Pulmonary effort is normal.  Musculoskeletal:     Cervical back: Normal range of motion.  Skin:    General: Skin is warm and dry.  Neurological:     General: No focal deficit present.     Mental Status: He is alert and oriented to person, place, and time.  Psychiatric:        Mood and Affect: Mood normal.        Behavior: Behavior normal.        Thought Content: Thought content normal.        Judgment: Judgment normal.      Assessment/Plan OSA  To OR for drug-induced sleep endoscopy.  Melida Quitter, MD 05/12/2020, 10:33 AM

## 2020-05-12 NOTE — Discharge Instructions (Signed)

## 2020-05-12 NOTE — Anesthesia Postprocedure Evaluation (Signed)
Anesthesia Post Note  Patient: Douglas Perkins.  Procedure(s) Performed: DRUG INDUCED ENDOSCOPY (N/A Nose)     Patient location during evaluation: PACU Anesthesia Type: General Level of consciousness: awake and alert Pain management: pain level controlled Vital Signs Assessment: post-procedure vital signs reviewed and stable Respiratory status: spontaneous breathing, nonlabored ventilation, respiratory function stable and patient connected to nasal cannula oxygen Cardiovascular status: blood pressure returned to baseline and stable Postop Assessment: no apparent nausea or vomiting Anesthetic complications: no   No complications documented.  Last Vitals:  Vitals:   05/12/20 1200 05/12/20 1213  BP: 130/65 (!) 148/67  Pulse: 71 64  Resp: (!) 23 20  Temp:  36.6 C  SpO2: 96% 99%    Last Pain:  Vitals:   05/12/20 1213  TempSrc:   PainSc: 0-No pain                 Preet Perrier

## 2020-05-12 NOTE — Op Note (Signed)
Preop diagnosis: Obstructive sleep apnea Postop diagnosis: same Procedure: Drug-induced sleep endoscopy Surgeon: Redmond Baseman Anesth: IV sedation Compl: None Findings: There is 90% anterior-posterior collapse at the velum making him a candidate for hypoglossal nerve stimulator placement.  There was also complete anterior-posterior collapse at the tongue base. Description:  After discussing risks, benefits, and alternatives, the patient was brought to the operative suite and placed on the operative table in the supine position.  Anesthesia was induced and the patient was given light sedation to simulate natural sleep. When the proper level was reached, an Afrin-soaked pledget was placed in the right nasal passage for a couple of minutes and then removed.  The fiberoptic laryngoscope was then passed to view the pharynx and larynx.  Findings are noted above and the exam was recorded.  After completion, the scope was removed and the patient was returned to anesthesia for wakeup and was moved to the recovery room in stable condition.

## 2020-05-13 ENCOUNTER — Encounter (HOSPITAL_BASED_OUTPATIENT_CLINIC_OR_DEPARTMENT_OTHER): Payer: Self-pay | Admitting: Otolaryngology

## 2020-07-08 ENCOUNTER — Other Ambulatory Visit: Payer: Self-pay | Admitting: Otolaryngology

## 2020-07-10 ENCOUNTER — Emergency Department: Payer: Medicare Other

## 2020-07-10 ENCOUNTER — Other Ambulatory Visit: Payer: Self-pay

## 2020-07-10 ENCOUNTER — Encounter: Payer: Self-pay | Admitting: Emergency Medicine

## 2020-07-10 ENCOUNTER — Emergency Department
Admission: EM | Admit: 2020-07-10 | Discharge: 2020-07-10 | Disposition: A | Discharge status: Home / Self Care | Payer: Medicare Other | Attending: Emergency Medicine | Admitting: Emergency Medicine

## 2020-07-10 DIAGNOSIS — S52351A Displaced comminuted fracture of shaft of radius, right arm, initial encounter for closed fracture: Secondary | ICD-10-CM | POA: Insufficient documentation

## 2020-07-10 DIAGNOSIS — W010XXA Fall on same level from slipping, tripping and stumbling without subsequent striking against object, initial encounter: Secondary | ICD-10-CM | POA: Diagnosis not present

## 2020-07-10 DIAGNOSIS — S0101XA Laceration without foreign body of scalp, initial encounter: Secondary | ICD-10-CM | POA: Diagnosis not present

## 2020-07-10 DIAGNOSIS — Z8546 Personal history of malignant neoplasm of prostate: Secondary | ICD-10-CM | POA: Diagnosis not present

## 2020-07-10 DIAGNOSIS — W19XXXA Unspecified fall, initial encounter: Secondary | ICD-10-CM

## 2020-07-10 DIAGNOSIS — S62101A Fracture of unspecified carpal bone, right wrist, initial encounter for closed fracture: Secondary | ICD-10-CM

## 2020-07-10 DIAGNOSIS — Z8673 Personal history of transient ischemic attack (TIA), and cerebral infarction without residual deficits: Secondary | ICD-10-CM | POA: Diagnosis not present

## 2020-07-10 DIAGNOSIS — Z87891 Personal history of nicotine dependence: Secondary | ICD-10-CM | POA: Insufficient documentation

## 2020-07-10 DIAGNOSIS — Z79899 Other long term (current) drug therapy: Secondary | ICD-10-CM | POA: Insufficient documentation

## 2020-07-10 DIAGNOSIS — S6991XA Unspecified injury of right wrist, hand and finger(s), initial encounter: Secondary | ICD-10-CM | POA: Diagnosis present

## 2020-07-10 DIAGNOSIS — Z950 Presence of cardiac pacemaker: Secondary | ICD-10-CM | POA: Diagnosis not present

## 2020-07-10 DIAGNOSIS — S0181XA Laceration without foreign body of other part of head, initial encounter: Secondary | ICD-10-CM

## 2020-07-10 DIAGNOSIS — I10 Essential (primary) hypertension: Secondary | ICD-10-CM | POA: Diagnosis not present

## 2020-07-10 DIAGNOSIS — Y92480 Sidewalk as the place of occurrence of the external cause: Secondary | ICD-10-CM | POA: Insufficient documentation

## 2020-07-10 DIAGNOSIS — I2510 Atherosclerotic heart disease of native coronary artery without angina pectoris: Secondary | ICD-10-CM | POA: Insufficient documentation

## 2020-07-10 DIAGNOSIS — Z85821 Personal history of Merkel cell carcinoma: Secondary | ICD-10-CM | POA: Insufficient documentation

## 2020-07-10 DIAGNOSIS — Z7982 Long term (current) use of aspirin: Secondary | ICD-10-CM | POA: Insufficient documentation

## 2020-07-10 DIAGNOSIS — Y9301 Activity, walking, marching and hiking: Secondary | ICD-10-CM | POA: Insufficient documentation

## 2020-07-10 DIAGNOSIS — S0083XA Contusion of other part of head, initial encounter: Secondary | ICD-10-CM

## 2020-07-10 DIAGNOSIS — Z23 Encounter for immunization: Secondary | ICD-10-CM | POA: Diagnosis not present

## 2020-07-10 MED ORDER — HYDROCODONE-ACETAMINOPHEN 5-325 MG PO TABS: 1.0000 | ORAL_TABLET | Freq: Four times a day (QID) | ORAL | 0 refills | Status: DC | PRN

## 2020-07-10 MED ORDER — BUPIVACAINE HCL 0.5 % IJ SOLN
50.0000 mL | Freq: Once | INTRAMUSCULAR | Status: DC
  Filled 2020-07-10: qty 50

## 2020-07-10 MED ORDER — BUPIVACAINE HCL 0.25 % IJ SOLN
50.0000 mL | Freq: Once | INTRAMUSCULAR | Status: DC
  Filled 2020-07-10: qty 50

## 2020-07-10 MED ORDER — TETANUS-DIPHTH-ACELL PERTUSSIS 5-2.5-18.5 LF-MCG/0.5 IM SUSY
0.5000 mL | PREFILLED_SYRINGE | Freq: Once | INTRAMUSCULAR | Status: AC
  Administered 2020-07-10: 0.5 mL via INTRAMUSCULAR
  Filled 2020-07-10: qty 0.5

## 2020-07-10 MED ORDER — HYDROCODONE-ACETAMINOPHEN 5-325 MG PO TABS
1.0000 | ORAL_TABLET | ORAL | Status: AC
  Administered 2020-07-10: 1 via ORAL
  Filled 2020-07-10: qty 1

## 2020-07-10 MED ORDER — LIDOCAINE HCL (PF) 1 % IJ SOLN
INTRAMUSCULAR | Status: AC
  Administered 2020-07-10: 1 mL
  Filled 2020-07-10: qty 10

## 2020-07-10 NOTE — ED Provider Notes (Addendum)
[facility] Emergency Department Provider Note  ____________________________________________   Event Date/Time   First MD Initiated Contact with Patient 07/10/20 0902     (approximate)  I have reviewed the triage vital signs and the nursing notes.   HISTORY  Chief Complaint Fall, Facial Pain, and Wrist Injury   HPI Douglas Perkins. is a 75 y.o. male with past medical history of bipolar disorder, HTN, SA node dysfunction with pacemaker, OSA on CPAP at night and TIA who presents for assessment of right-sided face pain and right wrist pain that began after he fell while walking this morning in the rain.  Patient states he slipped.  He is on ASA 81 daily but no other anticoagulation.  States he fell onto his right wrist.  Denies any other pain or recent injuries.   No other recent sick symptoms including nausea, vomiting, diarrhea, dysuria, rash, chest pain, cough, shortness of breath or other recent injuries or falls.  No other acute concerns per patient at this time.         Past Medical History:  Diagnosis Date  . Bipolar 1 disorder ([place])   . Cancer (HCC)    merkel cell cancer  . Cancer [facility])    prostate  . Glaucoma   . Hypertension   . 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 Sleep apnea    does not use CPAP  . TIA (transient ischemic attack)     Patient Active Problem List   Diagnosis Date Noted  . OSA (obstructive sleep apnea) 11/26/2019  . Cardiac pacemaker 04/30/2019  . History of sinoatrial node dysfunction 04/30/2019  . Other male erectile dysfunction 11/12/2018  . Near syncope 05/22/2017  . Coronary artery disease involving native coronary artery of native heart 01/10/2017  . Low serum testosterone level 06/16/2016  . Obesity (BMI 30-39.9) 03/09/2016  . Syncope 07/19/2015  . Sepsis ([place]) 07/07/2015  . Bipolar I disorder ([place])  06/24/2015  . Bilateral pneumonia 06/23/2015  . Bipolar affective disorder, currently manic, mild ([place]) 06/18/2015  . History of duodenal ulcer 06/18/2015  . Vaccine counseling 06/18/2015  . 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  . Bradycardia 02/08/2015  . Hyperlipidemia, mixed 10/14/2014  . AF (amaurosis fugax) 09/16/2014  . Benign essential hypertension 09/16/2014  . Chest pain 09/16/2014  . Skin cyst 08/06/2012  . Personal history of lymphoma 08/06/2012  . Cancer [facility])     Past Surgical History:  Procedure Laterality Date  . CHOLECYSTECTOMY    . COLONOSCOPY WITH PROPOFOL N/A 01/31/2017   Procedure: COLONOSCOPY WITH PROPOFOL;  Surgeon: Manya Silvas, MD;  Location: [facility] ENDOSCOPY;  Service: Endoscopy;  Laterality: N/A;  . DRUG INDUCED ENDOSCOPY N/A 05/12/2020   Procedure: DRUG INDUCED ENDOSCOPY;  Surgeon: Melida Quitter, MD;  Location: [facility];  Service: ENT;  Laterality: N/A;  . LEG SURGERY Left    distal  . Pace maker placement    . PROSTATE SURGERY    . PROSTATECTOMY    . SKIN CANCER EXCISION  2006,2007   Merkle Cell Carcinoma    Prior to Admission medications   Medication Sig Start Date End Date Taking? Authorizing Provider  amLODipine (NORVASC) 10 MG tablet Take by mouth daily.  01/10/17  Yes [provider]  aspirin EC 81 MG tablet Take 81 mg by  mouth daily. Reported on 08/18/2015   Yes [provider]  atorvastatin (LIPITOR) 40 MG tablet Take 1 tablet by mouth 1 day or 1 dose. 01/08/18  Yes [provider]  carbamazepine (TEGRETOL) 200 MG tablet Take 400 mg by mouth 2 (two) times daily.   Yes [provider]  carvedilol (COREG) 25 MG tablet Take 25 mg by mouth in the morning and at bedtime. 07/11/19 07/10/20 Yes [provider]  chlorthalidone (HYGROTON) 25 MG tablet Take 1 tablet by mouth daily. 08/23/17  Yes [provider]  cloNIDine (CATAPRES) 0.1  MG tablet Take 0.1 mg by mouth in the morning, at noon, and at bedtime. 08/11/19 08/10/20 Yes [provider]  dorzolamide-timolol (COSOPT) 22.3-6.8 MG/ML ophthalmic solution Place 1 drop into the right eye in the morning and at bedtime. 04/15/20  Yes [provider]  ferrous sulfate 325 (65 FE) MG tablet Take 325 mg by mouth daily.    Yes [provider]  gabapentin (NEURONTIN) 100 MG capsule Take 2 capsules by mouth 2 (two) times daily. 03/16/17  Yes [provider]  glucosamine-chondroitin 500-400 MG tablet Take 1 tablet by mouth daily.   Yes [provider]  hydrALAZINE (APRESOLINE) 25 MG tablet Take 1 tablet (25 mg total) by mouth 3 (three) times daily. 05/23/17 12/11/19 Yes Epifanio Lesches, MD  HYDROcodone-acetaminophen (NORCO) 5-325 MG tablet Take 1 tablet by mouth every 6 (six) hours as needed for severe pain. 07/10/20  Yes Lucrezia Starch, MD  hydrOXYzine (ATARAX/VISTARIL) 50 MG tablet Take 1 tablet by mouth daily. 05/27/20  Yes [provider]  latanoprost (XALATAN) 0.005 % ophthalmic solution Place 1 drop into the right eye 2 (two) times daily.   Yes [provider]  lisinopril (PRINIVIL,ZESTRIL) 40 MG tablet Take 40 mg by mouth 2 (two) times daily.    Yes [provider]  Multiple Vitamin (MULTIVITAMIN WITH MINERALS) TABS tablet Take 1 tablet by mouth daily.   Yes [provider]  Omega-3 Fatty Acids (FISH OIL) 1000 MG CAPS Take 1,000 mg by mouth 2 (two) times daily.    Yes [provider]  QUEtiapine (SEROQUEL) 300 MG tablet Take 600 mg by mouth at bedtime.   Yes [provider]  sildenafil (REVATIO) 20 MG tablet Take 1 tablet by mouth 1 day or 1 dose. 11/12/18  Yes [provider]  spironolactone (ALDACTONE) 25 MG tablet Take 25 mg by mouth daily.   Yes [provider]    Allergies Other, Feldene [piroxicam], and Sulfa antibiotics  Family History  Problem Relation Age  of Onset  . Stroke Father   . CAD Paternal Grandmother   . CAD Paternal Grandfather     Social History Social History   Tobacco Use  . Smoking status: Former Research scientist (life sciences)  . Smokeless tobacco: Never Used  Substance Use Topics  . Alcohol use: No    Alcohol/week: 0.0 standard drinks  . Drug use: No    Review of Systems  Review of Systems  Constitutional: Negative for chills and fever.  HENT: Negative for sore throat.   Eyes: Negative for pain.  Respiratory: Negative for cough and stridor.   Cardiovascular: Negative for chest pain.  Gastrointestinal: Negative for vomiting.  Musculoskeletal: Positive for joint pain ( R wrist) and myalgias ( face, R wrist).  Skin: Negative for rash.  Neurological: Negative for seizures, loss of consciousness and headaches.  Psychiatric/Behavioral: Negative for suicidal ideas.  All other systems reviewed and are negative.  ____________________________________________   PHYSICAL EXAM:  VITAL SIGNS: ED Triage Vitals  Enc Vitals Group     BP 07/10/20 0823 (!) 149/85     Pulse Rate 07/10/20 0823 96     Resp 07/10/20 0823 18     Temp 07/10/20 0828 98.2 F (36.8 C)     Temp Source 07/10/20 0828 Oral     SpO2 07/10/20 0823 93 %     Weight 07/10/20 0828 230 lb (104.3 kg)     Height 07/10/20 0828 6' (1.829 m)     Head Circumference --      Peak Flow --      Pain Score 07/10/20 0828 0     Pain Loc --      Pain Edu? --      Excl. in [place]? --    Vitals:   07/10/20 0828 07/10/20 1110  BP:  (!) 148/80  Pulse:  95  Resp:  18  Temp: 98.2 F (36.8 C)   SpO2:  93%   Physical Exam Vitals and nursing note reviewed.  Constitutional:      Appearance: He is well-developed.  HENT:     Head: Normocephalic.     Right Ear: External ear normal.     Left Ear: External ear normal.     Nose: Nose normal.  Eyes:     Conjunctiva/sclera: Conjunctivae normal.  Cardiovascular:     Rate and Rhythm: Normal rate and regular rhythm.     Heart sounds: No  murmur heard.   Pulmonary:     Effort: Pulmonary effort is normal. No respiratory distress.     Breath sounds: Normal breath sounds.  Abdominal:     Palpations: Abdomen is soft.     Tenderness: There is no abdominal tenderness.  Musculoskeletal:     Cervical back: Neck supple.  Skin:    General: Skin is warm and dry.     Capillary Refill: Capillary refill takes less than 2 seconds.  Neurological:     Mental Status: He is alert and oriented to person, place, and time.     2+ bilateral radial pulses.  No tenderness step-offs or deformities over the C/T/L-spine.  Cranial nerves II through XII grossly intact.  Patient has decreased grip strength and flexion extension of the right wrist but otherwise has full strength in the right upper extremity and throughout the left upper extremity.  Full strength of the bilateral lower extremities.  Sensation intact in distribution of the radial ulnar median nerves in the bilateral upper extremities.  Sensation intact light touch throughout the lower extremities. ____________________________________________   LABS (all labs ordered are listed, but only abnormal results are displayed)  Labs Reviewed - No data to display ____________________________________________  EKG  ____________________________________________  RADIOLOGY  ED MD interpretation: CT head, C-spine and face show no fracture dislocation or intracranial hemorrhage.  X-ray of the right wrist shows minimally displaced distal right radius fracture is comminuted.  No other acute fracture dislocation.  Official radiology report(s): DG Wrist Complete Right  Result Date: 07/10/2020 CLINICAL DATA:  Pain following fall EXAM: RIGHT WRIST - COMPLETE 3+ VIEW COMPARISON:  Right hand radiographs Sep 17, 2017 FINDINGS: Frontal, oblique, lateral, and ulnar deviation scaphoid images were obtained. There is a fracture of the distal radial metaphysis with a fracture fragment extending into the dorsal  aspect of the radiocarpal joint. There is mild impaction at the fracture site. There is mild dorsal displacement along the medial dorsal aspect of the radius. No other  fractures are evident. No dislocation. Osteoarthritic changes noted in the hamate-fifth metacarpal joint region as well as in the first carpal-metacarpal joint region. IMPRESSION: Comminuted fracture distal radius with mild displacement of fracture fragment along the dorsal, medial aspect of the distal radius. Mild impaction at the fracture site. No other fractures. No dislocation. There is osteoarthritic change medially and laterally in the distal wrist region. Electronically Signed   By: Lowella Grip III M.D.   On: 07/10/2020 09:37   CT Head Wo Contrast  Result Date: 07/10/2020 CLINICAL DATA:  75 year old male status post fall this morning. Pain. EXAM: CT HEAD WITHOUT CONTRAST TECHNIQUE: Contiguous axial images were obtained from the base of the skull through the vertex without intravenous contrast. COMPARISON:  Head CT 11/21/2017.  Brain MRI 11/28/2005. Face and cervical spine CT today. FINDINGS: Brain: Cerebral volume is not significantly changed since 2019. No midline shift, mass effect, or evidence of intracranial mass lesion. No ventriculomegaly. Chronic encephalomalacia in both occipital poles. Patchy and confluent bilateral white matter hypodensity, greater in the right hemisphere. Small area of cortical encephalomalacia in the right middle frontal gyrus has progressed since 2019 (series 2, image 23) but appears chronic. No acute intracranial hemorrhage identified. No cortically based acute infarct identified. Vascular: Calcified atherosclerosis at the skull base. No suspicious intracranial vascular hyperdensity. Skull: No fracture identified. Sinuses/Orbits: Visualized paranasal sinuses and mastoids are stable and well pneumatized. Other: Right forehead scalp laceration and hematoma with small volume soft tissue gas on series 3,  image 50. Underlying frontal bone intact. Other scalp soft tissues appears stable. Orbits soft tissues reported separately. IMPRESSION: 1. Right scalp laceration and hematoma without underlying skull fracture. 2. Mild progression of chronic ischemic disease since 2019. No acute intracranial abnormality identified. 3. Face and cervical spine reported separately. Electronically Signed   By: Genevie Ann M.D.   On: 07/10/2020 09:21   CT Cervical Spine Wo Contrast  Result Date: 07/10/2020 CLINICAL DATA:  75 year old male status post fall this morning. Pain. EXAM: CT CERVICAL SPINE WITHOUT CONTRAST TECHNIQUE: Multidetector CT imaging of the cervical spine was performed without intravenous contrast. Multiplanar CT image reconstructions were also generated. COMPARISON:  Prior cervical spine CT 09/16/2017. FINDINGS: Alignment: Stable since 2019 including mild anterolisthesis of C3 on C4, chronic facet ankylosis at that level. Cervicothoracic junction alignment is within normal limits. Bilateral posterior element alignment is within normal limits. Skull base and vertebrae: Visualized skull base is intact. No atlanto-occipital dissociation. C1 and C2 appear stable and intact. No acute osseous abnormality identified. Soft tissues and spinal canal: No prevertebral fluid or swelling. No visible canal hematoma. Left greater than right calcified carotid artery atherosclerosis in the neck. Otherwise negative visible noncontrast neck soft tissues. Disc levels: Stable cervical spine degeneration since 2019. Chronic spondylolisthesis and ankylosis at C3-C4. Up to mild degenerative cervical spinal stenosis. Upper chest: Visible upper thoracic levels appears stable and intact. Negative lung apices. Other: Left chest pacemaker type leads. IMPRESSION: 1. No acute traumatic injury identified in the cervical spine. 2. Stable cervical spine degeneration since 2019. Electronically Signed   By: Genevie Ann M.D.   On: 07/10/2020 09:28   CT  Maxillofacial Wo Contrast  Result Date: 07/10/2020 CLINICAL DATA:  75 year old male status post fall this morning. Pain. EXAM: CT MAXILLOFACIAL WITHOUT CONTRAST TECHNIQUE: Multidetector CT imaging of the maxillofacial structures was performed. Multiplanar CT image reconstructions were also generated. COMPARISON:  Head CT today reported separately. FINDINGS: Osseous: Mild motion artifact at the mandible which appears to remain  intact and normally located. No maxilla or zygoma fracture. No nasal bone fracture. Central skull base appears intact. Cervical spine detailed separately. Orbits: No orbital wall fracture. Chronic postoperative changes to the right globe. Otherwise intraorbital soft tissues appears symmetric and normal. There is superficial right periorbital soft tissue swelling up to 10 mm, preseptal. Sinuses: Clear. Chronic appearing rightward nasal septal deviation and spurring. Visible tympanic cavities and mastoids are clear. Soft tissues: Negative visible noncontrast larynx, pharynx, parapharyngeal spaces, retropharyngeal space, sublingual space, submandibular spaces, masticator and parotid spaces. Calcified carotid atherosclerosis in the neck is greater on the left. No upper cervical lymphadenopathy. Limited intracranial: Stable to that reported separately today. IMPRESSION: 1. Superficial right periorbital soft tissue swelling without underlying fracture. 2. No other acute traumatic injury identified in the face. 3. Cervical spine CT today reported separately. Electronically Signed   By: Genevie Ann M.D.   On: 07/10/2020 09:23    ____________________________________________   PROCEDURES  Procedure(s) performed (including Critical Care):  Marland KitchenMarland KitchenLaceration Repair  Date/Time: 07/10/2020 10:35 AM Performed by: Lucrezia Starch, MD Authorized by: Lucrezia Starch, MD   Consent:    Consent obtained:  Verbal   Consent given by:  Patient   Risks, benefits, and alternatives were discussed: yes      Risks discussed:  Infection, pain and need for additional repair Universal protocol:    Procedure explained and questions answered to patient or proxy's satisfaction: yes     Patient identity confirmed:  Verbally with patient and provided demographic data Anesthesia:    Anesthesia method:  Local infiltration   Local anesthetic:  Lidocaine 1% w/o epi Laceration details:    Location:  Scalp   Scalp location:  Frontal   Length (cm):  1.5 Exploration:    Wound exploration: wound explored through full range of motion     Contaminated: no   Treatment:    Area cleansed with:  Saline   Amount of cleaning:  Standard   Irrigation solution:  Sterile saline   Irrigation method:  Pressure wash   Debridement:  None Skin repair:    Repair method:  Sutures   Suture size:  5-0   Suture material:  Nylon   Suture technique:  Simple interrupted   Number of sutures:  2 Approximation:    Approximation:  Close Post-procedure details:    Procedure completion:  Tolerated well, no immediate complications     ____________________________________________   INITIAL IMPRESSION / ASSESSMENT AND PLAN / ED COURSE      Patient presents with above-stated history exam for assessment of right-sided facial pain and right wrist pain that began after he fell.  This was a ground-level mechanical fall as described above.  On arrival patient is afebrile and hemodynamically stable.  Exam as above remarkable for 1.5 cm linear hemostatic laceration over the forehead with some right periorbital ecchymosis.  There are no cranial nerve deficits and conjunctiva was unremarkable.  Patient also has some tenderness and decreased range of motion on the dorsum of his right wrist.  Lack repaired per procedure note above.  Tetanus updated.  X-ray of the right wrist shows evidence of comminuted distal radius fracture.  He has no snuffbox tenderness Evalose patient for other right upper extremity fracture at this time.  CT head  C-spine and face showed no acute fracture dislocation.  No evidence of intracranial hemorrhage.  Patient is otherwise neurovascular intact in all extremities denies any other acute sick symptoms.  Low suspicion for occult visceral injury or  other occult orthopedic injury at this time.  Patient given below noted analgesia.  Patient was placed in splint.  Discharged stable condition.  Strict to follow-up with orthopedic service.  Rx for Percocet written.      ____________________________________________   FINAL CLINICAL IMPRESSION(S) / ED DIAGNOSES  Final diagnoses:  Fall, initial encounter  Closed fracture of right wrist, initial encounter  Laceration of forehead, initial encounter  Contusion of face, initial encounter    Medications  bupivacaine (MARCAINE) 0.25 % (with pres) injection 50 mL (50 mLs Infiltration Not Given 07/10/20 1110)  HYDROcodone-acetaminophen (NORCO/VICODIN) 5-325 MG per tablet 1 tablet (1 tablet Oral Given 07/10/20 0942)  Tdap (BOOSTRIX) injection 0.5 mL (0.5 mLs Intramuscular Given 07/10/20 0943)  lidocaine (PF) (XYLOCAINE) 1 % injection (1 mL  Given 07/10/20 3794)     ED Discharge Orders         Ordered    HYDROcodone-acetaminophen (NORCO) 5-325 MG tablet  Every 6 hours PRN        07/10/20 1035           Note:  This document was prepared using Dragon voice recognition software and may include unintentional dictation errors.   Lucrezia Starch, MD 07/10/20 1113    Lucrezia Starch, MD 07/10/20 1149

## 2020-07-10 NOTE — ED Triage Notes (Signed)
Pt reports slipped on wet pavement this am and fell. Pt states tried to catch himself with his right arm and hurt his wrist. Pt with swelling and hematoma to right eyelid. Pt with small laceration above right eye. Bleeding controlled. Pt denies LOC. Pt reports takes a 81mg  asa daily. Pt reports no pain to face at all. Pt with slight swelling to right wrist.

## 2020-07-10 NOTE — ED Notes (Signed)
RN has messaged and called pharmacy for nerve block medication.

## 2020-07-10 NOTE — ED Notes (Signed)
Pt discharged home. VS stable.  Discharge instructions and follow up care reviewed with patient.

## 2020-07-10 NOTE — ED Notes (Signed)
EDT Mickel Baas to bedside to split affected limb at this time.

## 2020-07-14 ENCOUNTER — Ambulatory Visit
Admission: RE | Admit: 2020-07-14 | Discharge: 2020-07-14 | Disposition: A | Discharge status: Home / Self Care | Payer: Medicare Other | Source: Ambulatory Visit | Attending: Student | Admitting: Student

## 2020-07-14 ENCOUNTER — Other Ambulatory Visit: Payer: Self-pay | Admitting: Student

## 2020-07-14 ENCOUNTER — Other Ambulatory Visit (HOSPITAL_COMMUNITY): Payer: Self-pay | Admitting: Student

## 2020-07-14 ENCOUNTER — Other Ambulatory Visit: Payer: Self-pay

## 2020-07-14 DIAGNOSIS — S52571A Other intraarticular fracture of lower end of right radius, initial encounter for closed fracture: Secondary | ICD-10-CM

## 2020-07-16 ENCOUNTER — Encounter (HOSPITAL_BASED_OUTPATIENT_CLINIC_OR_DEPARTMENT_OTHER): Payer: Self-pay | Admitting: Otolaryngology

## 2020-07-16 ENCOUNTER — Other Ambulatory Visit: Payer: Self-pay

## 2020-07-19 ENCOUNTER — Other Ambulatory Visit (HOSPITAL_COMMUNITY)
Admission: RE | Admit: 2020-07-19 | Discharge: 2020-07-19 | Disposition: A | Discharge status: Home / Self Care | Payer: Medicare Other | Source: Ambulatory Visit | Attending: Otolaryngology | Admitting: Otolaryngology

## 2020-07-19 ENCOUNTER — Encounter (HOSPITAL_BASED_OUTPATIENT_CLINIC_OR_DEPARTMENT_OTHER)
Admission: RE | Admit: 2020-07-19 | Discharge: 2020-07-19 | Disposition: A | Discharge status: Home / Self Care | Payer: Medicare Other | Source: Ambulatory Visit | Attending: Otolaryngology | Admitting: Otolaryngology

## 2020-07-19 DIAGNOSIS — Z8249 Family history of ischemic heart disease and other diseases of the circulatory system: Secondary | ICD-10-CM | POA: Diagnosis not present

## 2020-07-19 DIAGNOSIS — Z20822 Contact with and (suspected) exposure to covid-19: Secondary | ICD-10-CM | POA: Insufficient documentation

## 2020-07-19 DIAGNOSIS — Z79899 Other long term (current) drug therapy: Secondary | ICD-10-CM | POA: Diagnosis not present

## 2020-07-19 DIAGNOSIS — I10 Essential (primary) hypertension: Secondary | ICD-10-CM | POA: Diagnosis not present

## 2020-07-19 DIAGNOSIS — Z01812 Encounter for preprocedural laboratory examination: Secondary | ICD-10-CM | POA: Diagnosis present

## 2020-07-19 DIAGNOSIS — Z87891 Personal history of nicotine dependence: Secondary | ICD-10-CM | POA: Diagnosis not present

## 2020-07-19 DIAGNOSIS — Z888 Allergy status to other drugs, medicaments and biological substances status: Secondary | ICD-10-CM | POA: Diagnosis not present

## 2020-07-19 DIAGNOSIS — Z8673 Personal history of transient ischemic attack (TIA), and cerebral infarction without residual deficits: Secondary | ICD-10-CM | POA: Diagnosis not present

## 2020-07-19 DIAGNOSIS — G4733 Obstructive sleep apnea (adult) (pediatric): Secondary | ICD-10-CM | POA: Diagnosis present

## 2020-07-19 DIAGNOSIS — Z7982 Long term (current) use of aspirin: Secondary | ICD-10-CM | POA: Diagnosis not present

## 2020-07-19 DIAGNOSIS — Z882 Allergy status to sulfonamides status: Secondary | ICD-10-CM | POA: Diagnosis not present

## 2020-07-19 DIAGNOSIS — Z823 Family history of stroke: Secondary | ICD-10-CM | POA: Diagnosis not present

## 2020-07-19 DIAGNOSIS — Z950 Presence of cardiac pacemaker: Secondary | ICD-10-CM | POA: Diagnosis not present

## 2020-07-19 DIAGNOSIS — Z8546 Personal history of malignant neoplasm of prostate: Secondary | ICD-10-CM | POA: Diagnosis not present

## 2020-07-19 LAB — BASIC METABOLIC PANEL
Anion gap: 9 (ref 5–15)
BUN: 30 mg/dL — ABNORMAL HIGH (ref 8–23)
CO2: 28 mmol/L (ref 22–32)
Calcium: 8.2 mg/dL — ABNORMAL LOW (ref 8.9–10.3)
Chloride: 97 mmol/L — ABNORMAL LOW (ref 98–111)
Creatinine, Ser: 0.97 mg/dL (ref 0.61–1.24)
GFR, Estimated: >60 mL/min (ref >60)
Glucose, Bld: 124 mg/dL — ABNORMAL HIGH (ref 70–99)
Potassium: 4.7 mmol/L (ref 3.5–5.1)
Sodium: 134 mmol/L — ABNORMAL LOW (ref 135–145)

## 2020-07-20 LAB — SARS CORONAVIRUS 2 (TAT 6-24 HRS): SARS Coronavirus 2: NEGATIVE

## 2020-07-21 ENCOUNTER — Encounter (HOSPITAL_BASED_OUTPATIENT_CLINIC_OR_DEPARTMENT_OTHER): Payer: Self-pay | Admitting: Otolaryngology

## 2020-07-21 ENCOUNTER — Ambulatory Visit (HOSPITAL_BASED_OUTPATIENT_CLINIC_OR_DEPARTMENT_OTHER)
Admission: RE | Admit: 2020-07-21 | Discharge: 2020-07-21 | Disposition: A | Discharge status: Home / Self Care | Payer: Medicare Other | Source: Ambulatory Visit | Attending: Otolaryngology | Admitting: Otolaryngology

## 2020-07-21 ENCOUNTER — Encounter (HOSPITAL_BASED_OUTPATIENT_CLINIC_OR_DEPARTMENT_OTHER)
Admission: RE | Disposition: A | Discharge status: Home / Self Care | Payer: Self-pay | Source: Ambulatory Visit | Attending: Otolaryngology

## 2020-07-21 ENCOUNTER — Ambulatory Visit (HOSPITAL_BASED_OUTPATIENT_CLINIC_OR_DEPARTMENT_OTHER): Payer: Medicare Other | Admitting: Certified Registered"

## 2020-07-21 ENCOUNTER — Other Ambulatory Visit: Payer: Self-pay

## 2020-07-21 ENCOUNTER — Ambulatory Visit (HOSPITAL_COMMUNITY): Payer: Medicare Other

## 2020-07-21 DIAGNOSIS — Z882 Allergy status to sulfonamides status: Secondary | ICD-10-CM | POA: Insufficient documentation

## 2020-07-21 DIAGNOSIS — Z8546 Personal history of malignant neoplasm of prostate: Secondary | ICD-10-CM | POA: Insufficient documentation

## 2020-07-21 DIAGNOSIS — Z87891 Personal history of nicotine dependence: Secondary | ICD-10-CM | POA: Diagnosis not present

## 2020-07-21 DIAGNOSIS — Z950 Presence of cardiac pacemaker: Secondary | ICD-10-CM | POA: Diagnosis not present

## 2020-07-21 DIAGNOSIS — Z8249 Family history of ischemic heart disease and other diseases of the circulatory system: Secondary | ICD-10-CM | POA: Insufficient documentation

## 2020-07-21 DIAGNOSIS — I10 Essential (primary) hypertension: Secondary | ICD-10-CM | POA: Insufficient documentation

## 2020-07-21 DIAGNOSIS — G4733 Obstructive sleep apnea (adult) (pediatric): Secondary | ICD-10-CM | POA: Insufficient documentation

## 2020-07-21 DIAGNOSIS — Z888 Allergy status to other drugs, medicaments and biological substances status: Secondary | ICD-10-CM | POA: Insufficient documentation

## 2020-07-21 DIAGNOSIS — Z8673 Personal history of transient ischemic attack (TIA), and cerebral infarction without residual deficits: Secondary | ICD-10-CM | POA: Insufficient documentation

## 2020-07-21 DIAGNOSIS — Z823 Family history of stroke: Secondary | ICD-10-CM | POA: Insufficient documentation

## 2020-07-21 DIAGNOSIS — Z7982 Long term (current) use of aspirin: Secondary | ICD-10-CM | POA: Insufficient documentation

## 2020-07-21 DIAGNOSIS — Z79899 Other long term (current) drug therapy: Secondary | ICD-10-CM | POA: Insufficient documentation

## 2020-07-21 HISTORY — PX: IMPLANTATION OF HYPOGLOSSAL NERVE STIMULATOR: SHX6827

## 2020-07-21 SURGERY — INSERTION, HYPOGLOSSAL NERVE STIMULATOR
Anesthesia: General | Site: Neck

## 2020-07-21 MED ORDER — ONDANSETRON HCL 4 MG/2ML IJ SOLN: 4.0000 mg | Freq: Once | INTRAMUSCULAR | Status: DC | PRN

## 2020-07-21 MED ORDER — PHENYLEPHRINE HCL (PRESSORS) 10 MG/ML IV SOLN
INTRAVENOUS | Status: AC
  Filled 2020-07-21: qty 2

## 2020-07-21 MED ORDER — PHENYLEPHRINE HCL (PRESSORS) 10 MG/ML IV SOLN
INTRAVENOUS | Status: AC
  Filled 2020-07-21: qty 1

## 2020-07-21 MED ORDER — FENTANYL CITRATE (PF) 100 MCG/2ML IJ SOLN
25.0000 ug | INTRAMUSCULAR | Status: DC | PRN
Start: 2020-07-21 — End: 2020-07-21

## 2020-07-21 MED ORDER — LIDOCAINE HCL (CARDIAC) PF 100 MG/5ML IV SOSY
PREFILLED_SYRINGE | INTRAVENOUS | Status: DC | PRN
  Administered 2020-07-21: 60 mg via INTRAVENOUS

## 2020-07-21 MED ORDER — 0.9 % SODIUM CHLORIDE (POUR BTL) OPTIME
TOPICAL | Status: DC | PRN
  Administered 2020-07-21: 200 mL

## 2020-07-21 MED ORDER — LACTATED RINGERS IV SOLN: INTRAVENOUS | Status: DC

## 2020-07-21 MED ORDER — OXYCODONE HCL 5 MG PO TABS: 5.0000 mg | ORAL_TABLET | Freq: Once | ORAL | Status: DC | PRN

## 2020-07-21 MED ORDER — OXYCODONE HCL 5 MG/5ML PO SOLN: 5.0000 mg | Freq: Once | ORAL | Status: DC | PRN

## 2020-07-21 MED ORDER — PHENYLEPHRINE HCL-NACL 10-0.9 MG/250ML-% IV SOLN
INTRAVENOUS | Status: DC | PRN
  Administered 2020-07-21: 40 ug/min via INTRAVENOUS

## 2020-07-21 MED ORDER — CEFAZOLIN SODIUM-DEXTROSE 2-4 GM/100ML-% IV SOLN
INTRAVENOUS | Status: AC
  Filled 2020-07-21: qty 100

## 2020-07-21 MED ORDER — ALBUMIN HUMAN 5 % IV SOLN
INTRAVENOUS | Status: AC
  Filled 2020-07-21: qty 250

## 2020-07-21 MED ORDER — LIDOCAINE 2% (20 MG/ML) 5 ML SYRINGE
INTRAMUSCULAR | Status: AC
  Filled 2020-07-21: qty 10

## 2020-07-21 MED ORDER — LIDOCAINE-EPINEPHRINE 1 %-1:100000 IJ SOLN
INTRAMUSCULAR | Status: AC
  Filled 2020-07-21: qty 1

## 2020-07-21 MED ORDER — DEXAMETHASONE SODIUM PHOSPHATE 10 MG/ML IJ SOLN
INTRAMUSCULAR | Status: AC
  Filled 2020-07-21: qty 1

## 2020-07-21 MED ORDER — ONDANSETRON HCL 4 MG/2ML IJ SOLN
INTRAMUSCULAR | Status: AC
  Filled 2020-07-21: qty 8

## 2020-07-21 MED ORDER — EPHEDRINE SULFATE-NACL 50-0.9 MG/10ML-% IV SOSY
PREFILLED_SYRINGE | INTRAVENOUS | Status: DC | PRN
  Administered 2020-07-21: 25 mg via INTRAVENOUS

## 2020-07-21 MED ORDER — FENTANYL CITRATE (PF) 100 MCG/2ML IJ SOLN
INTRAMUSCULAR | Status: AC
  Filled 2020-07-21: qty 2

## 2020-07-21 MED ORDER — DEXAMETHASONE SODIUM PHOSPHATE 4 MG/ML IJ SOLN
INTRAMUSCULAR | Status: DC | PRN
  Administered 2020-07-21: 4 mg via INTRAVENOUS

## 2020-07-21 MED ORDER — LIDOCAINE-EPINEPHRINE 1 %-1:100000 IJ SOLN
INTRAMUSCULAR | Status: DC | PRN
  Administered 2020-07-21: 6 mL

## 2020-07-21 MED ORDER — ALBUMIN HUMAN 5 % IV SOLN: INTRAVENOUS | Status: DC | PRN

## 2020-07-21 MED ORDER — EPHEDRINE 5 MG/ML INJ
INTRAVENOUS | Status: AC
  Filled 2020-07-21: qty 10

## 2020-07-21 MED ORDER — PROPOFOL 10 MG/ML IV BOLUS
INTRAVENOUS | Status: DC | PRN
  Administered 2020-07-21: 50 mg via INTRAVENOUS
  Administered 2020-07-21: 100 mg via INTRAVENOUS

## 2020-07-21 MED ORDER — SUCCINYLCHOLINE CHLORIDE 20 MG/ML IJ SOLN
INTRAMUSCULAR | Status: DC | PRN
  Administered 2020-07-21: 100 mg via INTRAVENOUS

## 2020-07-21 MED ORDER — CEFAZOLIN SODIUM-DEXTROSE 2-4 GM/100ML-% IV SOLN
2.0000 g | INTRAVENOUS | Status: AC
  Administered 2020-07-21: 2 g via INTRAVENOUS

## 2020-07-21 MED ORDER — SUCCINYLCHOLINE CHLORIDE 200 MG/10ML IV SOSY
PREFILLED_SYRINGE | INTRAVENOUS | Status: AC
  Filled 2020-07-21: qty 10

## 2020-07-21 MED ORDER — PROPOFOL 500 MG/50ML IV EMUL
INTRAVENOUS | Status: DC | PRN
  Administered 2020-07-21: 50 ug/kg/min via INTRAVENOUS

## 2020-07-21 MED ORDER — ONDANSETRON HCL 4 MG/2ML IJ SOLN
INTRAMUSCULAR | Status: DC | PRN
  Administered 2020-07-21: 4 mg via INTRAVENOUS

## 2020-07-21 SURGICAL SUPPLY — 66 items
ACC NRSTM 4 TRQ WRNCH STRL (MISCELLANEOUS)
ADH SKN CLS APL DERMABOND .7 (GAUZE/BANDAGES/DRESSINGS) ×2
BAG DECANTER FOR FLEXI CONT (MISCELLANEOUS) ×2 IMPLANT
BLADE CLIPPER SURG (BLADE) IMPLANT
BLADE SURG 15 STRL LF DISP TIS (BLADE) ×1 IMPLANT
BLADE SURG 15 STRL SS (BLADE) ×2
CANISTER SUCT 1200ML W/VALVE (MISCELLANEOUS) ×2 IMPLANT
CORD BIPOLAR FORCEPS 12FT (ELECTRODE) ×2 IMPLANT
COVER PROBE W GEL 5X96 (DRAPES) ×2 IMPLANT
COVER WAND RF STERILE (DRAPES) ×2 IMPLANT
DERMABOND ADVANCED (GAUZE/BANDAGES/DRESSINGS) ×2
DERMABOND ADVANCED .7 DNX12 (GAUZE/BANDAGES/DRESSINGS) ×2 IMPLANT
DRAPE C-ARM 35X43 STRL (DRAPES) ×2 IMPLANT
DRAPE HEAD BAR (DRAPES) IMPLANT
DRAPE INCISE IOBAN 66X45 STRL (DRAPES) ×2 IMPLANT
DRAPE MICROSCOPE WILD 40.5X102 (DRAPES) ×2 IMPLANT
DRAPE UTILITY XL STRL (DRAPES) ×2 IMPLANT
DRSG TEGADERM 2-3/8X2-3/4 SM (GAUZE/BANDAGES/DRESSINGS) ×2 IMPLANT
DRSG TEGADERM 4X4.75 (GAUZE/BANDAGES/DRESSINGS) ×2 IMPLANT
ELECT COATED BLADE 2.86 ST (ELECTRODE) ×2 IMPLANT
ELECT EMG 18 NIMS (NEUROSURGERY SUPPLIES) ×2
ELECT REM PT RETURN 9FT ADLT (ELECTROSURGICAL) ×2
ELECTRODE EMG 18 NIMS (NEUROSURGERY SUPPLIES) ×1 IMPLANT
ELECTRODE REM PT RTRN 9FT ADLT (ELECTROSURGICAL) ×1 IMPLANT
FORCEPS BIPOLAR SPETZLER 8 1.0 (NEUROSURGERY SUPPLIES) ×2 IMPLANT
GAUZE 4X4 16PLY RFD (DISPOSABLE) ×2 IMPLANT
GAUZE SPONGE 4X4 12PLY STRL (GAUZE/BANDAGES/DRESSINGS) ×2 IMPLANT
GENERATOR PULSE INSPIRE (Generator) ×2 IMPLANT
GLOVE SURG ENC MOIS LTX SZ6.5 (GLOVE) IMPLANT
GLOVE SURG ENC MOIS LTX SZ7.5 (GLOVE) ×2 IMPLANT
GOWN STRL REUS W/ TWL LRG LVL3 (GOWN DISPOSABLE) ×3 IMPLANT
GOWN STRL REUS W/TWL LRG LVL3 (GOWN DISPOSABLE) ×6
IV CATH 18G SAFETY (IV SOLUTION) ×2 IMPLANT
KIT NEURO ACCESSORY W/WRENCH (MISCELLANEOUS) IMPLANT
LEAD SENSING RESP INSPIRE (Lead) ×2 IMPLANT
LEAD SLEEP STIMULATION INSPIRE (Lead) ×2 IMPLANT
LOOP VESSEL MAXI BLUE (MISCELLANEOUS) ×2 IMPLANT
LOOP VESSEL MINI RED (MISCELLANEOUS) ×2 IMPLANT
MARKER SKIN DUAL TIP RULER LAB (MISCELLANEOUS) ×4 IMPLANT
NEEDLE HYPO 25X1 1.5 SAFETY (NEEDLE) ×2 IMPLANT
NS IRRIG 1000ML POUR BTL (IV SOLUTION) ×2 IMPLANT
PACK BASIN DAY SURGERY FS (CUSTOM PROCEDURE TRAY) ×2 IMPLANT
PACK ENT DAY SURGERY (CUSTOM PROCEDURE TRAY) ×2 IMPLANT
PASSER CATH 36 CODMAN DISP (NEUROSURGERY SUPPLIES) IMPLANT
PASSER CATH 38CM DISP (INSTRUMENTS) ×2 IMPLANT
PENCIL SMOKE EVACUATOR (MISCELLANEOUS) ×2 IMPLANT
PROBE NERVE STIMULATOR (NEUROSURGERY SUPPLIES) ×2 IMPLANT
REMOTE CONTROL SLEEP INSPIRE (MISCELLANEOUS) ×2 IMPLANT
SET WALTER ACTIVATION W/DRAPE (SET/KITS/TRAYS/PACK) ×2 IMPLANT
SLEEVE SCD COMPRESS KNEE MED (STOCKING) ×2 IMPLANT
SLING ARM FOAM STRAP LRG (SOFTGOODS) IMPLANT
SLING ARM FOAM STRAP MED (SOFTGOODS) IMPLANT
SPONGE INTESTINAL PEANUT (DISPOSABLE) ×2 IMPLANT
STAPLER VISISTAT 35W (STAPLE) IMPLANT
SUT SILK 2 0 SH (SUTURE) ×2 IMPLANT
SUT SILK 3 0 REEL (SUTURE) ×2 IMPLANT
SUT SILK 3 0 SH 30 (SUTURE) ×4 IMPLANT
SUT SILK 3-0 (SUTURE) ×2
SUT SILK 3-0 RB1 30XBRD (SUTURE) ×1
SUT VIC AB 3-0 SH 27 (SUTURE) ×2
SUT VIC AB 3-0 SH 27X BRD (SUTURE) ×1 IMPLANT
SUT VIC AB 4-0 PS2 27 (SUTURE) ×4 IMPLANT
SUTURE SILK 3-0 RB1 30XBRD (SUTURE) ×1 IMPLANT
SYR 10ML LL (SYRINGE) ×2 IMPLANT
SYR BULB EAR ULCER 3OZ GRN STR (SYRINGE) ×2 IMPLANT
TOWEL GREEN STERILE FF (TOWEL DISPOSABLE) ×4 IMPLANT

## 2020-07-21 NOTE — Anesthesia Procedure Notes (Addendum)
Procedure Name: Intubation Date/Time: 07/21/2020 8:30 AM Performed by: Signe Colt, CRNA Pre-anesthesia Checklist: Patient identified, Emergency Drugs available, Suction available and Patient being monitored Patient Re-evaluated:Patient Re-evaluated prior to induction Oxygen Delivery Method: Circle system utilized Preoxygenation: Pre-oxygenation with 100% oxygen Induction Type: IV induction Ventilation: Mask ventilation without difficulty Laryngoscope Size: Glidescope and 3 Grade View: Grade I Tube type: Oral Tube size: 7.0 mm Number of attempts: 1 Airway Equipment and Method: Stylet and Oral airway Placement Confirmation: ETT inserted through vocal cords under direct vision,  positive ETCO2 and breath sounds checked- equal and bilateral Secured at: 22 cm Tube secured with: Tape Dental Injury: Teeth and Oropharynx as per pre-operative assessment  Difficulty Due To: Difficulty was anticipated

## 2020-07-21 NOTE — Transfer of Care (Signed)
Immediate Anesthesia Transfer of Care Note  Patient: Douglas Perkins.  Procedure(s) Performed: IMPLANTATION OF HYPOGLOSSAL NERVE STIMULATOR (N/A Neck)  Patient Location: PACU  Anesthesia Type:General  Level of Consciousness: drowsy and patient cooperative  Airway & Oxygen Therapy: Patient Spontanous Breathing and Patient connected to face mask oxygen  Post-op Assessment: Report given to RN and Post -op Vital signs reviewed and stable  Post vital signs: Reviewed and stable  Last Vitals:  Vitals Value Taken Time  BP 127/63 07/21/20 1053  Temp    Pulse 70 07/21/20 1056  Resp 19 07/21/20 1056  SpO2 99 % 07/21/20 1056  Vitals shown include unvalidated device data.  Last Pain:  Vitals:   07/21/20 0657  TempSrc: Oral  PainSc: 0-No pain      Patients Stated Pain Goal: 5 (59/29/24 4628)  Complications: No complications documented.

## 2020-07-21 NOTE — Brief Op Note (Signed)
07/21/2020  10:43 AM  PATIENT:  Douglas Perkins.  75 y.o. male  PRE-OPERATIVE DIAGNOSIS:  Obstructive sleep apnea  POST-OPERATIVE DIAGNOSIS:  Obstructive sleep apnea  PROCEDURE:  Procedure(s): IMPLANTATION OF HYPOGLOSSAL NERVE STIMULATOR (N/A)  SURGEON:  Surgeon(s) and Role:    * Melida Quitter, MD - Primary    * Skotnicki, Meghan A, DO - Assisting  PHYSICIAN ASSISTANT:   ASSISTANTS: As above   ANESTHESIA:   general  EBL: Minimal  BLOOD ADMINISTERED:none  DRAINS: none   LOCAL MEDICATIONS USED:  LIDOCAINE   SPECIMEN:  No Specimen  DISPOSITION OF SPECIMEN:  N/A  COUNTS:  YES  TOURNIQUET:  * No tourniquets in log *  DICTATION: .Note written in EPIC  PLAN OF CARE: Discharge to home after PACU  PATIENT DISPOSITION:  PACU - hemodynamically stable.   Delay start of Pharmacological VTE agent (>24hrs) due to surgical blood loss or risk of bleeding: no

## 2020-07-21 NOTE — Anesthesia Postprocedure Evaluation (Signed)
Anesthesia Post Note  Patient: Douglas Perkins.  Procedure(s) Performed: IMPLANTATION OF HYPOGLOSSAL NERVE STIMULATOR (N/A Neck)     Patient location during evaluation: PACU Anesthesia Type: General Level of consciousness: awake and alert Pain management: pain level controlled Vital Signs Assessment: post-procedure vital signs reviewed and stable Respiratory status: spontaneous breathing, nonlabored ventilation, respiratory function stable and patient connected to nasal cannula oxygen Cardiovascular status: blood pressure returned to baseline and stable Postop Assessment: no apparent nausea or vomiting Anesthetic complications: no   No complications documented.  Last Vitals:  Vitals:   07/21/20 1130 07/21/20 1147  BP: 109/61 127/61  Pulse: 73 75  Resp: (!) 23 (!) 25  Temp:    SpO2: 94% 95%    Last Pain:  Vitals:   07/21/20 1130  TempSrc:   PainSc: 0-No pain                 Kailani Brass COKER

## 2020-07-21 NOTE — Anesthesia Procedure Notes (Signed)
Performed by: Sir Mallis, Ernesta Amble, CRNA

## 2020-07-21 NOTE — Discharge Instructions (Signed)

## 2020-07-21 NOTE — Anesthesia Preprocedure Evaluation (Addendum)
Anesthesia Evaluation  Patient identified by MRN, date of birth, ID band Patient awake    Reviewed: Allergy & Precautions, NPO status , Patient's Chart, lab work & pertinent test results  Airway Mallampati: III  TM Distance: >3 FB Neck ROM: Full    Dental  (+) Teeth Intact, Dental Advisory Given   Pulmonary former smoker,    breath sounds clear to auscultation       Cardiovascular hypertension,  Rhythm:Regular Rate:Normal     Neuro/Psych    GI/Hepatic   Endo/Other    Renal/GU      Musculoskeletal   Abdominal   Peds  Hematology   Anesthesia Other Findings Ecchymosis R. Lateral periorbital region Cast on R. forearm  Reproductive/Obstetrics                             Anesthesia Physical Anesthesia Plan  ASA: III  Anesthesia Plan: General   Post-op Pain Management:    Induction: Intravenous  PONV Risk Score and Plan: Ondansetron and Dexamethasone  Airway Management Planned: Oral ETT  Additional Equipment:   Intra-op Plan:   Post-operative Plan:   Informed Consent: I have reviewed the patients History and Physical, chart, labs and discussed the procedure including the risks, benefits and alternatives for the proposed anesthesia with the patient or authorized representative who has indicated his/her understanding and acceptance.     Dental advisory given  Plan Discussed with: CRNA and Anesthesiologist  Anesthesia Plan Comments: (75 year old male Moderate to severe OSA intolerant of CPAP Recent mechanical  fall 3/12 R. Radius fracture  treated nonoperatively (-) facial, Heat and C-spine CTs, No LOC Htn Sinus Node dysfunction with dual chamber pacer, not pacer dependent S/P Prostatectomy for Prostate Ca  Plan GA with oral ETT Douglas Perkins)      Anesthesia Quick Evaluation

## 2020-07-21 NOTE — H&P (Signed)
Douglas Westbrooks. is an 75 y.o. male.   Chief Complaint: Sleep apnea HPI: 75 year old male with sleep apnea unable to tolerate CPAP.  He presents for hypoglossal nerve stimulator placement.  Past Medical History:  Diagnosis Date  . Bipolar 1 disorder ([place])   . Cancer (HCC)    merkel cell cancer  . Cancer [facility])    prostate  . Glaucoma   . Hypertension   . 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 Sleep apnea    does not use CPAP  . TIA (transient ischemic attack)     Past Surgical History:  Procedure Laterality Date  . CHOLECYSTECTOMY    . COLONOSCOPY WITH PROPOFOL N/A 01/31/2017   Procedure: COLONOSCOPY WITH PROPOFOL;  Surgeon: Manya Silvas, MD;  Location: [facility] ENDOSCOPY;  Service: Endoscopy;  Laterality: N/A;  . DRUG INDUCED ENDOSCOPY N/A 05/12/2020   Procedure: DRUG INDUCED ENDOSCOPY;  Surgeon: Melida Quitter, MD;  Location: [place];  Service: ENT;  Laterality: N/A;  . LEG SURGERY Left    distal  . Pace maker placement    . PROSTATE SURGERY    . PROSTATECTOMY    . SKIN CANCER EXCISION  2006,2007   Merkle Cell Carcinoma    Family History  Problem Relation Age of Onset  . Stroke Father   . CAD Paternal Grandmother   . CAD Paternal Grandfather    Social History:  reports that he has quit smoking. He has never used smokeless tobacco. He reports that he does not drink alcohol and does not use drugs.  Allergies:  Allergies  Allergen Reactions  . Other Hives, Shortness Of Breath and Other (See Comments)    Pt states that he is allergic to unwashed blood products.   Other reaction(s): Other (See Comments) UNWASHED BLOOD PRODUCTS  Pt states that he is allergic to unwashed blood products.   . Feldene [Piroxicam] Hives  . Sulfa Antibiotics Hives    Medications Prior to Admission  Medication Sig Dispense Refill  . amLODipine (NORVASC) 10 MG tablet Take  by mouth daily.   0  . atorvastatin (LIPITOR) 40 MG tablet Take 1 tablet by mouth 1 day or 1 dose.    . carbamazepine (TEGRETOL) 200 MG tablet Take 400 mg by mouth 2 (two) times daily.    . carvedilol (COREG) 25 MG tablet Take 25 mg by mouth in the morning and at bedtime.    . dorzolamide-timolol (COSOPT) 22.3-6.8 MG/ML ophthalmic solution Place 1 drop into the right eye in the morning and at bedtime.    . gabapentin (NEURONTIN) 100 MG capsule Take 100 mg by mouth 2 (two) times daily.  0  . latanoprost (XALATAN) 0.005 % ophthalmic solution Place 1 drop into the right eye 2 (two) times daily.    Marland Kitchen lisinopril (PRINIVIL,ZESTRIL) 40 MG tablet Take 40 mg by mouth 2 (two) times daily.     . Multiple Vitamin (MULTIVITAMIN WITH MINERALS) TABS tablet Take 1 tablet by mouth daily.    . Omega-3 Fatty Acids (FISH OIL) 1000 MG CAPS Take 1,000 mg by mouth 2 (two) times daily.     . QUEtiapine (SEROQUEL) 300 MG tablet Take 600 mg by mouth at bedtime.    . sildenafil (REVATIO) 20 MG tablet Take 1 tablet by mouth 1 day or 1 dose.    . spironolactone (ALDACTONE) 25 MG  tablet Take 25 mg by mouth daily.    Marland Kitchen aspirin EC 81 MG tablet Take 81 mg by mouth daily. Reported on 08/18/2015    . cloNIDine (CATAPRES) 0.1 MG tablet Take 0.1 mg by mouth in the morning, at noon, and at bedtime.    . ferrous sulfate 325 (65 FE) MG tablet Take 325 mg by mouth daily.     Marland Kitchen glucosamine-chondroitin 500-400 MG tablet Take 1 tablet by mouth daily.    . hydrALAZINE (APRESOLINE) 50 MG tablet Take 50 mg by mouth 3 (three) times daily.    Marland Kitchen HYDROcodone-acetaminophen (NORCO/VICODIN) 5-325 MG tablet Take 1 tablet by mouth every 6 (six) hours as needed for moderate pain.      Results for orders placed or performed during the hospital encounter of 07/21/20 (from the past 48 hour(s))  Basic metabolic panel per protocol     Status: Abnormal   Collection Time: 07/19/20  1:00 PM  Result Value Ref Range   Sodium 134 (L) 135 - 145 mmol/L    Potassium 4.7 3.5 - 5.1 mmol/L   Chloride 97 (L) 98 - 111 mmol/L   CO2 28 22 - 32 mmol/L   Glucose, Bld 124 (H) 70 - 99 mg/dL    Comment: Glucose reference range applies only to samples taken after fasting for at least 8 hours.   BUN 30 (H) 8 - 23 mg/dL   Creatinine, Ser 0.97 0.61 - 1.24 mg/dL   Calcium 8.2 (L) 8.9 - 10.3 mg/dL   GFR, Estimated >60 >60 mL/min    Comment: (NOTE) Calculated using the CKD-EPI Creatinine Equation (2021)    Anion gap 9 5 - 15    Comment: Performed at [facility] [address] [place], [place] [address]   No results found.  Review of Systems  All other systems reviewed and are negative.   Blood pressure (!) 92/55, pulse 76, temperature (!) 97.5 F (36.4 C), temperature source Oral, resp. rate 18, height 6' (1.829 m), weight 99.1 kg, SpO2 97 %. Physical Exam Constitutional:      Appearance: Normal appearance. He is normal weight.  HENT:     Head: Normocephalic and atraumatic.     Right Ear: External ear normal.     Left Ear: External ear normal.     Nose: Nose normal.     Mouth/Throat:     Mouth: Mucous membranes are moist.     Pharynx: Oropharynx is clear.  Eyes:     Extraocular Movements: Extraocular movements intact.     Conjunctiva/sclera: Conjunctivae normal.     Pupils: Pupils are equal, round, and reactive to light.     Comments: Right lateral orbital ecchymosis.  Cardiovascular:     Pulses: Normal pulses.  Musculoskeletal:     Cervical back: Normal range of motion.  Skin:    General: Skin is warm and dry.  Neurological:     General: No focal deficit present.     Mental Status: He is alert and oriented to person, place, and time.  Psychiatric:        Mood and Affect: Mood normal.        Behavior: Behavior normal.        Thought Content: Thought content normal.        Judgment: Judgment normal.      Assessment/Plan Sleep apnea  To OR for hypoglossal nerve stimulator placement.  Melida Quitter, MD 07/21/2020, 7:58  AM

## 2020-07-21 NOTE — Op Note (Signed)

## 2020-07-22 ENCOUNTER — Encounter (HOSPITAL_BASED_OUTPATIENT_CLINIC_OR_DEPARTMENT_OTHER): Payer: Self-pay | Admitting: Otolaryngology

## 2020-08-19 ENCOUNTER — Institutional Professional Consult (permissible substitution): Payer: Medicare Other | Admitting: Neurology

## 2020-08-26 ENCOUNTER — Ambulatory Visit: Payer: Medicare Other | Admitting: Neurology

## 2020-11-28 NOTE — Progress Notes (Signed)
Douglas Perkins  [phone] [phone]  ID: Douglas Perkins. OB: [birth year]  MR#: [id number]  [id number]  Patient Care Team: [name], MD as PCP - General (Family Medicine) Christene Lye, MD (General Surgery)  CHIEF COMPLAINT: History of Merkel cell on right leg.  INTERVAL HISTORY: Patient returns to clinic today for repeat laboratory work and routine yearly evaluation.  He continues to feel well and remains asymptomatic. He has no neurologic complaints. He denies any recent fevers or illnesses.  He has a good appetite and denies weight loss.  He has no chest pain, shortness of breath, cough, or hemoptysis. He denies any nausea, vomiting, constipation, or diarrhea.  He has no urinary complaints.  He continues to have mild right lower extremity edema, but this is chronic and unchanged since his surgery for Merkel cell 15 years ago.  Patient feels at his baseline offers no specific complaints today.  REVIEW OF SYSTEMS:   Review of Systems  Constitutional: Negative.  Negative for fever, malaise/fatigue and weight loss.  Respiratory: Negative.  Negative for cough and shortness of breath.   Cardiovascular: Negative.  Negative for chest pain and leg swelling.  Gastrointestinal: Negative.  Negative for abdominal pain, blood in stool, constipation, diarrhea, melena, nausea and vomiting.  Musculoskeletal: Negative.  Negative for back pain.  Skin: Negative.  Negative for rash.  Neurological: Negative.  Negative for sensory change, focal weakness and weakness.  Psychiatric/Behavioral: Negative.  Negative for depression. The patient is not nervous/anxious.    As per HPI. Otherwise, a complete review of systems is negative.  PAST MEDICAL HISTORY: Past Medical History:  Diagnosis Date   Bipolar 1 disorder (Douglas Perkins)    Cancer (Douglas Perkins)    merkel cell cancer   Cancer ([place])    prostate   Glaucoma    Hypertension    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Douglas Perkins)     Mural thrombus of cardiac apex    Presence of permanent cardiac pacemaker 2017   SA node dysfunction   Sleep apnea    does not use CPAP   TIA (transient ischemic attack)     PAST SURGICAL HISTORY: Past Surgical History:  Procedure Laterality Date   CHOLECYSTECTOMY     COLONOSCOPY WITH PROPOFOL N/A 01/31/2017   Procedure: COLONOSCOPY WITH PROPOFOL;  Surgeon: Manya Silvas, MD;  [facility] END[facility]COPY;  Service: Endoscopy;  Laterality: N/A;   DRUG INDUCED ENDOSCOPY N/A 05/12/2020   Procedure: DRUG INDUCED ENDOSCOPY;  Surgeon: Melida Quitter, MD;  Location: [place];  Service: ENT;  Laterality: N/A;   IMPLANTATION OF HYPOGLOSSAL NERVE STIMULATOR N/A 07/21/2020   Procedure: IMPLANTATION OF HYPOGLOSSAL NERVE STIMULATOR;  Surgeon: Melida Quitter, MD;  Location: [place];  Service: ENT;  Laterality: N/A;   LEG SURGERY Left    distal   Pace maker placement     PROSTATE SURGERY     PROSTATECTOMY     SKIN CANCER EXCISION  2006,2007   Merkle Cell Carcinoma    FAMILY HISTORY: Heart disease and bipolar disease.    ADVANCED DIRECTIVES:    HEALTH MAINTENANCE: Social History   Tobacco Use   Smoking status: Former   Smokeless tobacco: Never  Substance Use Topics   Alcohol use: No    Alcohol/week: 0.0 standard drinks   Drug use: No     Colonoscopy:  PAP:  Bone density:  Lipid panel:  Allergies  Allergen Reactions   Other Hives, Shortness Of Breath and Other (See Comments)  Pt states that he is allergic to unwashed blood products.   Other reaction(s): Other (See Comments) UNWASHED BLOOD PRODUCTS  Pt states that he is allergic to unwashed blood products.     Feldene [Piroxicam] Hives   Sulfa Antibiotics Hives    Current Outpatient Medications  Medication Sig Dispense Refill   amLODipine (NORVASC) 10 MG tablet Take by mouth daily.   0   aspirin EC 81 MG tablet Take 81 mg by mouth daily. Reported on 08/18/2015     atorvastatin (LIPITOR)  40 MG tablet Take 1 tablet by mouth 1 day or 1 dose.     carvedilol (COREG) 25 MG tablet Take 25 mg by mouth in the morning and at bedtime.     cloNIDine (CATAPRES) 0.1 MG tablet Take 0.1 mg by mouth in the morning, at noon, and at bedtime.     diphenhydrAMINE (BENADRYL) 50 MG capsule Take 50 mg by mouth at bedtime as needed.     dorzolamide-timolol (COSOPT) 22.3-6.8 MG/ML ophthalmic solution Place 1 drop into the right eye in the morning and at bedtime.     ferrous sulfate 325 (65 FE) MG tablet Take 325 mg by mouth daily.      gabapentin (NEURONTIN) 100 MG capsule Take 100 mg by mouth 2 (two) times daily.  0   glucosamine-chondroitin 500-400 MG tablet Take 1 tablet by mouth daily.     latanoprost (XALATAN) 0.005 % ophthalmic solution Place 1 drop into the right eye 2 (two) times daily.     lisinopril (PRINIVIL,ZESTRIL) 40 MG tablet Take 40 mg by mouth 2 (two) times daily.      Multiple Vitamin (MULTIVITAMIN WITH MINERALS) TABS tablet Take 1 tablet by mouth daily.     Omega-3 Fatty Acids (FISH OIL) 1000 MG CAPS Take 1,000 mg by mouth 2 (two) times daily.      QUEtiapine (SEROQUEL) 300 MG tablet Take 600 mg by mouth at bedtime.     sildenafil (REVATIO) 20 MG tablet Take 1 tablet by mouth 1 day or 1 dose.     spironolactone (ALDACTONE) 25 MG tablet Take 25 mg by mouth daily.     carbamazepine (TEGRETOL) 200 MG tablet Take 400 mg by mouth 2 (two) times daily. (Patient not taking: Reported on 11/30/2020)     hydrALAZINE (APRESOLINE) 50 MG tablet Take 50 mg by mouth 3 (three) times daily. (Patient not taking: Reported on 11/30/2020)     HYDROcodone-acetaminophen (NORCO/VICODIN) 5-325 MG tablet Take 1 tablet by mouth every 6 (six) hours as needed for moderate pain. (Patient not taking: Reported on 11/30/2020)     No current facility-administered medications for this visit.    OBJECTIVE: Vitals:   11/30/20 1114  BP: 96/62  Pulse: 63  Resp: 16  Temp: 98.8 F (37.1 C)  SpO2: 97%     Body mass index  is 28.41 kg/m.    ECOG FS:0 - Asymptomatic  General: Well-developed, well-nourished, no acute distress. Eyes: Pink conjunctiva, anicteric sclera. HEENT: Normocephalic, moist mucous membranes. Lungs: No audible wheezing or coughing. Heart: Regular rate and rhythm. Abdomen: Soft, nontender, no obvious distention. Musculoskeletal: No edema, cyanosis, or clubbing. Neuro: Alert, answering all questions appropriately. Cranial nerves grossly intact. Skin: No rashes or petechiae noted. Psych: Normal affect.  LAB RESULTS:  Lab Results  Component Value Date   NA 127 (L) 11/29/2020   K 4.6 11/29/2020   CL 94 (L) 11/29/2020   CO2 24 11/29/2020   GLUCOSE 109 (H) 11/29/2020   BUN 17  11/29/2020   CREATININE 0.69 11/29/2020   CALCIUM 8.7 (L) 11/29/2020   PROT 7.2 11/29/2020   ALBUMIN 3.8 11/29/2020   AST 20 11/29/2020   ALT 22 11/29/2020   ALKPHOS 75 11/29/2020   BILITOT 0.6 11/29/2020   GFRNONAA >60 11/29/2020   GFRAA >60 12/01/2019    Lab Results  Component Value Date   WBC 8.6 11/29/2020   NEUTROABS 5.1 11/29/2020   HGB 13.0 11/29/2020   HCT 37.3 (L) 11/29/2020   MCV 91.9 11/29/2020   PLT 302 11/29/2020     STUDIES: No results found.  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 Adjuvant chemoradiation therapy completed in July of 2007.  PLAN:    1. Merkel cell: No evidence of disease.  PET scan from November 2014 was negative for any recurrence, no further imaging is necessary.  All of his laboratory work continues to be either negative or within normal limits including his most recent AFP which was reported at 2.8.  No intervention is needed at this time.  Patient is now 15 years removed from completing his treatment and can be discharged from clinic.  No further follow-up has been scheduled.  2. Hypertension: Patient's blood pressure is slightly decreased today.  He is asymptomatic.  Continue evaluation and treatment per primary care. 3.  Hyponatremia: Mild, monitor. 4.  Elevated ALT: Resolved.  Patient expressed understanding and was in agreement with this plan. He also understands that He can call clinic at any time with any questions, concerns, or complaints.    Lloyd Huger, MD   11/30/2020 3:13 PM

## 2020-11-29 ENCOUNTER — Other Ambulatory Visit: Payer: Self-pay

## 2020-11-29 ENCOUNTER — Inpatient Hospital Stay: Payer: Medicare Other | Attending: Oncology

## 2020-11-29 DIAGNOSIS — I10 Essential (primary) hypertension: Secondary | ICD-10-CM | POA: Diagnosis not present

## 2020-11-29 DIAGNOSIS — E871 Hypo-osmolality and hyponatremia: Secondary | ICD-10-CM | POA: Insufficient documentation

## 2020-11-29 DIAGNOSIS — Z9221 Personal history of antineoplastic chemotherapy: Secondary | ICD-10-CM | POA: Insufficient documentation

## 2020-11-29 DIAGNOSIS — C4A9 Merkel cell carcinoma, unspecified: Secondary | ICD-10-CM

## 2020-11-29 DIAGNOSIS — R7989 Other specified abnormal findings of blood chemistry: Secondary | ICD-10-CM | POA: Diagnosis not present

## 2020-11-29 DIAGNOSIS — F319 Bipolar disorder, unspecified: Secondary | ICD-10-CM | POA: Insufficient documentation

## 2020-11-29 DIAGNOSIS — Z950 Presence of cardiac pacemaker: Secondary | ICD-10-CM | POA: Diagnosis not present

## 2020-11-29 DIAGNOSIS — Z79899 Other long term (current) drug therapy: Secondary | ICD-10-CM | POA: Diagnosis not present

## 2020-11-29 DIAGNOSIS — Z87891 Personal history of nicotine dependence: Secondary | ICD-10-CM | POA: Insufficient documentation

## 2020-11-29 DIAGNOSIS — Z85821 Personal history of Merkel cell carcinoma: Secondary | ICD-10-CM | POA: Diagnosis not present

## 2020-11-29 DIAGNOSIS — Z923 Personal history of irradiation: Secondary | ICD-10-CM | POA: Diagnosis not present

## 2020-11-29 LAB — CBC WITH DIFFERENTIAL/PLATELET
Abs Immature Granulocytes: 0.05 10*3/uL (ref 0.00–0.07)
Basophils Absolute: 0 10*3/uL (ref 0.0–0.1)
Basophils Relative: 0 %
Eosinophils Absolute: 0.2 10*3/uL (ref 0.0–0.5)
Eosinophils Relative: 3 %
HCT: 37.3 % — ABNORMAL LOW (ref 39.0–52.0)
Hemoglobin: 13 g/dL (ref 13.0–17.0)
Immature Granulocytes: 1 %
Lymphocytes Relative: 25 %
Lymphs Abs: 2.1 10*3/uL (ref 0.7–4.0)
MCH: 32 pg (ref 26.0–34.0)
MCHC: 34.9 g/dL (ref 30.0–36.0)
MCV: 91.9 fL (ref 80.0–100.0)
Monocytes Absolute: 1 10*3/uL (ref 0.1–1.0)
Monocytes Relative: 12 %
Neutro Abs: 5.1 10*3/uL (ref 1.7–7.7)
Neutrophils Relative %: 59 %
Platelets: 302 10*3/uL (ref 150–400)
RBC: 4.06 MIL/uL — ABNORMAL LOW (ref 4.22–5.81)
RDW: 12.1 % (ref 11.5–15.5)
WBC: 8.6 10*3/uL (ref 4.0–10.5)
nRBC: 0 % (ref 0.0–0.2)

## 2020-11-29 LAB — COMPREHENSIVE METABOLIC PANEL
ALT: 22 U/L (ref 0–44)
AST: 20 U/L (ref 15–41)
Albumin: 3.8 g/dL (ref 3.5–5.0)
Alkaline Phosphatase: 75 U/L (ref 38–126)
Anion gap: 9 (ref 5–15)
BUN: 17 mg/dL (ref 8–23)
CO2: 24 mmol/L (ref 22–32)
Calcium: 8.7 mg/dL — ABNORMAL LOW (ref 8.9–10.3)
Chloride: 94 mmol/L — ABNORMAL LOW (ref 98–111)
Creatinine, Ser: 0.69 mg/dL (ref 0.61–1.24)
GFR, Estimated: >60 mL/min (ref >60)
Glucose, Bld: 109 mg/dL — ABNORMAL HIGH (ref 70–99)
Potassium: 4.6 mmol/L (ref 3.5–5.1)
Sodium: 127 mmol/L — ABNORMAL LOW (ref 135–145)
Total Bilirubin: 0.6 mg/dL (ref 0.3–1.2)
Total Protein: 7.2 g/dL (ref 6.5–8.1)

## 2020-11-29 LAB — IRON AND TIBC
Iron: 103 ug/dL (ref 45–182)
Saturation Ratios: 29 % (ref 17.9–39.5)
TIBC: 356 ug/dL (ref 250–450)
UIBC: 253 ug/dL

## 2020-11-29 LAB — FERRITIN: Ferritin: 182 ng/mL (ref 24–336)

## 2020-11-30 ENCOUNTER — Inpatient Hospital Stay (HOSPITAL_BASED_OUTPATIENT_CLINIC_OR_DEPARTMENT_OTHER): Payer: Medicare Other | Admitting: Oncology

## 2020-11-30 VITALS — BP 96/62 | HR 63 | Temp 98.8°F | Resp 16 | Wt 209.5 lb

## 2020-11-30 DIAGNOSIS — Z85821 Personal history of Merkel cell carcinoma: Secondary | ICD-10-CM | POA: Diagnosis not present

## 2020-11-30 DIAGNOSIS — C4A9 Merkel cell carcinoma, unspecified: Secondary | ICD-10-CM | POA: Diagnosis not present

## 2020-11-30 LAB — AFP TUMOR MARKER: AFP, Serum, Tumor Marker: 2.8 ng/mL (ref 0.0–8.4)

## 2020-11-30 NOTE — Progress Notes (Signed)
Patient denies new problems/concerns today.   °

## 2020-12-09 ENCOUNTER — Ambulatory Visit: Payer: Medicare Other | Admitting: Neurology

## 2021-02-18 ENCOUNTER — Encounter (HOSPITAL_COMMUNITY): Payer: Self-pay | Admitting: Internal Medicine

## 2021-02-18 ENCOUNTER — Inpatient Hospital Stay (HOSPITAL_COMMUNITY)
Admission: EM | Admit: 2021-02-18 | Discharge: 2021-02-22 | DRG: 643 | Disposition: A | Discharge status: Skilled Nursing Facility | Payer: Medicare Other | Source: Skilled Nursing Facility | Attending: Internal Medicine | Admitting: Internal Medicine

## 2021-02-18 ENCOUNTER — Emergency Department (HOSPITAL_COMMUNITY): Payer: Medicare Other

## 2021-02-18 DIAGNOSIS — R4182 Altered mental status, unspecified: Secondary | ICD-10-CM

## 2021-02-18 DIAGNOSIS — I119 Hypertensive heart disease without heart failure: Secondary | ICD-10-CM | POA: Diagnosis present

## 2021-02-18 DIAGNOSIS — E871 Hypo-osmolality and hyponatremia: Secondary | ICD-10-CM | POA: Diagnosis present

## 2021-02-18 DIAGNOSIS — L89311 Pressure ulcer of right buttock, stage 1: Secondary | ICD-10-CM | POA: Diagnosis present

## 2021-02-18 DIAGNOSIS — Z950 Presence of cardiac pacemaker: Secondary | ICD-10-CM | POA: Diagnosis present

## 2021-02-18 DIAGNOSIS — Z888 Allergy status to other drugs, medicaments and biological substances status: Secondary | ICD-10-CM

## 2021-02-18 DIAGNOSIS — I2510 Atherosclerotic heart disease of native coronary artery without angina pectoris: Secondary | ICD-10-CM | POA: Diagnosis present

## 2021-02-18 DIAGNOSIS — E782 Mixed hyperlipidemia: Secondary | ICD-10-CM | POA: Diagnosis present

## 2021-02-18 DIAGNOSIS — L8990 Pressure ulcer of unspecified site, unspecified stage: Secondary | ICD-10-CM | POA: Insufficient documentation

## 2021-02-18 DIAGNOSIS — F319 Bipolar disorder, unspecified: Secondary | ICD-10-CM | POA: Diagnosis present

## 2021-02-18 DIAGNOSIS — Z87891 Personal history of nicotine dependence: Secondary | ICD-10-CM

## 2021-02-18 DIAGNOSIS — Z79899 Other long term (current) drug therapy: Secondary | ICD-10-CM

## 2021-02-18 DIAGNOSIS — E222 Syndrome of inappropriate secretion of antidiuretic hormone: Principal | ICD-10-CM | POA: Diagnosis present

## 2021-02-18 DIAGNOSIS — Z20822 Contact with and (suspected) exposure to covid-19: Secondary | ICD-10-CM | POA: Diagnosis present

## 2021-02-18 DIAGNOSIS — I452 Bifascicular block: Secondary | ICD-10-CM | POA: Diagnosis present

## 2021-02-18 DIAGNOSIS — Z823 Family history of stroke: Secondary | ICD-10-CM

## 2021-02-18 DIAGNOSIS — H409 Unspecified glaucoma: Secondary | ICD-10-CM | POA: Diagnosis present

## 2021-02-18 DIAGNOSIS — Z8673 Personal history of transient ischemic attack (TIA), and cerebral infarction without residual deficits: Secondary | ICD-10-CM

## 2021-02-18 DIAGNOSIS — Z882 Allergy status to sulfonamides status: Secondary | ICD-10-CM

## 2021-02-18 DIAGNOSIS — I10 Essential (primary) hypertension: Secondary | ICD-10-CM | POA: Diagnosis present

## 2021-02-18 DIAGNOSIS — G9341 Metabolic encephalopathy: Secondary | ICD-10-CM | POA: Diagnosis present

## 2021-02-18 DIAGNOSIS — Z8546 Personal history of malignant neoplasm of prostate: Secondary | ICD-10-CM

## 2021-02-18 DIAGNOSIS — Z8249 Family history of ischemic heart disease and other diseases of the circulatory system: Secondary | ICD-10-CM

## 2021-02-18 DIAGNOSIS — G4733 Obstructive sleep apnea (adult) (pediatric): Secondary | ICD-10-CM | POA: Diagnosis present

## 2021-02-18 DIAGNOSIS — Z85821 Personal history of Merkel cell carcinoma: Secondary | ICD-10-CM

## 2021-02-18 DIAGNOSIS — Z7982 Long term (current) use of aspirin: Secondary | ICD-10-CM

## 2021-02-18 LAB — URINALYSIS, ROUTINE W REFLEX MICROSCOPIC
Bilirubin Urine: NEGATIVE
Glucose, UA: NEGATIVE mg/dL
Hgb urine dipstick: NEGATIVE
Ketones, ur: NEGATIVE mg/dL
Leukocytes,Ua: NEGATIVE
Nitrite: NEGATIVE
Protein, ur: NEGATIVE mg/dL
Specific Gravity, Urine: 1.025 (ref 1.005–1.030)
pH: 5 (ref 5.0–8.0)

## 2021-02-18 LAB — CBC
HCT: 32 % — ABNORMAL LOW (ref 39.0–52.0)
Hemoglobin: 11.4 g/dL — ABNORMAL LOW (ref 13.0–17.0)
MCH: 32.4 pg (ref 26.0–34.0)
MCHC: 35.6 g/dL (ref 30.0–36.0)
MCV: 90.9 fL (ref 80.0–100.0)
Platelets: 375 10*3/uL (ref 150–400)
RBC: 3.52 MIL/uL — ABNORMAL LOW (ref 4.22–5.81)
RDW: 12.4 % (ref 11.5–15.5)
WBC: 8.9 10*3/uL (ref 4.0–10.5)
nRBC: 0 % (ref 0.0–0.2)

## 2021-02-18 LAB — BASIC METABOLIC PANEL
Anion gap: 12 (ref 5–15)
Anion gap: 9 (ref 5–15)
BUN: 7 mg/dL — ABNORMAL LOW (ref 8–23)
BUN: 7 mg/dL — ABNORMAL LOW (ref 8–23)
CO2: 19 mmol/L — ABNORMAL LOW (ref 22–32)
CO2: 23 mmol/L (ref 22–32)
Calcium: 8 mg/dL — ABNORMAL LOW (ref 8.9–10.3)
Calcium: 8.4 mg/dL — ABNORMAL LOW (ref 8.9–10.3)
Chloride: 88 mmol/L — ABNORMAL LOW (ref 98–111)
Chloride: 91 mmol/L — ABNORMAL LOW (ref 98–111)
Creatinine, Ser: 0.6 mg/dL — ABNORMAL LOW (ref 0.61–1.24)
Creatinine, Ser: 0.67 mg/dL (ref 0.61–1.24)
GFR, Estimated: >60 mL/min (ref >60)
GFR, Estimated: >60 mL/min (ref >60)
Glucose, Bld: 114 mg/dL — ABNORMAL HIGH (ref 70–99)
Glucose, Bld: 112 mg/dL — ABNORMAL HIGH (ref 70–99)
Potassium: 4 mmol/L (ref 3.5–5.1)
Potassium: 4.1 mmol/L (ref 3.5–5.1)
Sodium: 119 mmol/L — CL (ref 135–145)
Sodium: 123 mmol/L — ABNORMAL LOW (ref 135–145)

## 2021-02-18 LAB — RESP PANEL BY RT-PCR (FLU A&B, COVID) ARPGX2
Influenza A by PCR: NEGATIVE
Influenza B by PCR: NEGATIVE
SARS Coronavirus 2 by RT PCR: NEGATIVE

## 2021-02-18 LAB — COMPREHENSIVE METABOLIC PANEL
ALT: 41 U/L (ref 0–44)
AST: 59 U/L — ABNORMAL HIGH (ref 15–41)
Albumin: 4 g/dL (ref 3.5–5.0)
Alkaline Phosphatase: 76 U/L (ref 38–126)
Anion gap: 13 (ref 5–15)
BUN: 9 mg/dL (ref 8–23)
CO2: 22 mmol/L (ref 22–32)
Calcium: 8.6 mg/dL — ABNORMAL LOW (ref 8.9–10.3)
Chloride: 82 mmol/L — ABNORMAL LOW (ref 98–111)
Creatinine, Ser: 0.71 mg/dL (ref 0.61–1.24)
GFR, Estimated: >60 mL/min (ref >60)
Glucose, Bld: 124 mg/dL — ABNORMAL HIGH (ref 70–99)
Potassium: 4.6 mmol/L (ref 3.5–5.1)
Sodium: 117 mmol/L — CL (ref 135–145)
Total Bilirubin: 1 mg/dL (ref 0.3–1.2)
Total Protein: 6.9 g/dL (ref 6.5–8.1)

## 2021-02-18 LAB — NA AND K (SODIUM & POTASSIUM), RAND UR
Potassium Urine: 35 mmol/L
Sodium, Ur: 70 mmol/L

## 2021-02-18 LAB — VALPROIC ACID LEVEL: Valproic Acid Lvl: <10 ug/mL — ABNORMAL LOW (ref 50.0–100.0)

## 2021-02-18 LAB — OSMOLALITY: Osmolality: 255 mOsm/kg — ABNORMAL LOW (ref 275–295)

## 2021-02-18 LAB — OSMOLALITY, URINE: Osmolality, Ur: 285 mOsm/kg — ABNORMAL LOW (ref 300–900)

## 2021-02-18 MED ORDER — ACETAMINOPHEN 325 MG PO TABS: 650.0000 mg | ORAL_TABLET | Freq: Four times a day (QID) | ORAL | Status: DC | PRN

## 2021-02-18 MED ORDER — POLYETHYLENE GLYCOL 3350 17 G PO PACK
17.0000 g | PACK | Freq: Every day | ORAL | Status: DC | PRN
  Administered 2021-02-20: 17 g via ORAL
  Filled 2021-02-18: qty 1

## 2021-02-18 MED ORDER — QUETIAPINE FUMARATE 300 MG PO TABS
300.0000 mg | ORAL_TABLET | Freq: Every day | ORAL | Status: DC
  Administered 2021-02-18 – 2021-02-21 (×4): 300 mg via ORAL
  Filled 2021-02-18: qty 3
  Filled 2021-02-18: qty 1
  Filled 2021-02-18: qty 3
  Filled 2021-02-18: qty 1
  Filled 2021-02-18: qty 3
  Filled 2021-02-18 (×3): qty 1
  Filled 2021-02-18: qty 3

## 2021-02-18 MED ORDER — ACETAMINOPHEN 650 MG RE SUPP: 650.0000 mg | Freq: Four times a day (QID) | RECTAL | Status: DC | PRN

## 2021-02-18 MED ORDER — SERTRALINE HCL 25 MG PO TABS
25.0000 mg | ORAL_TABLET | Freq: Every day | ORAL | Status: DC
  Administered 2021-02-18: 25 mg via ORAL
  Filled 2021-02-18: qty 1

## 2021-02-18 MED ORDER — PANTOPRAZOLE SODIUM 40 MG PO TBEC
40.0000 mg | DELAYED_RELEASE_TABLET | Freq: Two times a day (BID) | ORAL | Status: DC
  Administered 2021-02-18 – 2021-02-22 (×8): 40 mg via ORAL
  Filled 2021-02-18 (×8): qty 1

## 2021-02-18 MED ORDER — CLONIDINE HCL 0.1 MG PO TABS
0.1000 mg | ORAL_TABLET | Freq: Two times a day (BID) | ORAL | Status: DC
  Administered 2021-02-18 – 2021-02-19 (×2): 0.1 mg via ORAL
  Filled 2021-02-18 (×3): qty 1

## 2021-02-18 MED ORDER — BISACODYL 5 MG PO TBEC
5.0000 mg | DELAYED_RELEASE_TABLET | Freq: Every day | ORAL | Status: DC | PRN
  Administered 2021-02-20: 5 mg via ORAL
  Filled 2021-02-18: qty 1

## 2021-02-18 MED ORDER — ONDANSETRON HCL 4 MG PO TABS: 4.0000 mg | ORAL_TABLET | Freq: Four times a day (QID) | ORAL | Status: DC | PRN

## 2021-02-18 MED ORDER — CARVEDILOL 25 MG PO TABS
25.0000 mg | ORAL_TABLET | Freq: Two times a day (BID) | ORAL | Status: DC
  Administered 2021-02-18 – 2021-02-19 (×3): 25 mg via ORAL
  Filled 2021-02-18 (×2): qty 1
  Filled 2021-02-18: qty 2

## 2021-02-18 MED ORDER — ASPIRIN EC 81 MG PO TBEC
81.0000 mg | DELAYED_RELEASE_TABLET | Freq: Every day | ORAL | Status: DC
  Administered 2021-02-19 – 2021-02-22 (×4): 81 mg via ORAL
  Filled 2021-02-18 (×4): qty 1

## 2021-02-18 MED ORDER — MORPHINE SULFATE (PF) 2 MG/ML IV SOLN
2.0000 mg | INTRAVENOUS | Status: DC | PRN
Start: 2021-02-18 — End: 2021-02-20

## 2021-02-18 MED ORDER — HYDRALAZINE HCL 20 MG/ML IJ SOLN: 5.0000 mg | INTRAMUSCULAR | Status: DC | PRN

## 2021-02-18 MED ORDER — SODIUM CHLORIDE 0.9 % IV SOLN: INTRAVENOUS | Status: DC

## 2021-02-18 MED ORDER — DOCUSATE SODIUM 100 MG PO CAPS
100.0000 mg | ORAL_CAPSULE | Freq: Two times a day (BID) | ORAL | Status: DC
  Administered 2021-02-18 – 2021-02-22 (×8): 100 mg via ORAL
  Filled 2021-02-18 (×8): qty 1

## 2021-02-18 MED ORDER — ONDANSETRON HCL 4 MG/2ML IJ SOLN: 4.0000 mg | Freq: Four times a day (QID) | INTRAMUSCULAR | Status: DC | PRN

## 2021-02-18 MED ORDER — SODIUM CHLORIDE 0.9% FLUSH
3.0000 mL | Freq: Two times a day (BID) | INTRAVENOUS | Status: DC
  Administered 2021-02-19 – 2021-02-22 (×6): 3 mL via INTRAVENOUS

## 2021-02-18 MED ORDER — ATORVASTATIN CALCIUM 40 MG PO TABS
40.0000 mg | ORAL_TABLET | ORAL | Status: DC
  Administered 2021-02-18 – 2021-02-19 (×2): 40 mg via ORAL
  Filled 2021-02-18 (×2): qty 1

## 2021-02-18 MED ORDER — ONDANSETRON HCL 4 MG/2ML IJ SOLN
4.0000 mg | Freq: Once | INTRAMUSCULAR | Status: AC
  Administered 2021-02-18: 4 mg via INTRAVENOUS
  Filled 2021-02-18: qty 2

## 2021-02-18 MED ORDER — VALPROIC ACID 250 MG/5ML PO SOLN
750.0000 mg | Freq: Every day | ORAL | Status: DC
  Administered 2021-02-18: 750 mg via ORAL
  Filled 2021-02-18 (×2): qty 15

## 2021-02-18 MED ORDER — OLANZAPINE 5 MG PO TABS
5.0000 mg | ORAL_TABLET | Freq: Every day | ORAL | Status: DC
  Filled 2021-02-18: qty 1

## 2021-02-18 MED ORDER — ALBUTEROL SULFATE (2.5 MG/3ML) 0.083% IN NEBU: 2.5000 mg | INHALATION_SOLUTION | RESPIRATORY_TRACT | Status: DC | PRN

## 2021-02-18 MED ORDER — ENOXAPARIN SODIUM 40 MG/0.4ML IJ SOSY
40.0000 mg | PREFILLED_SYRINGE | INTRAMUSCULAR | Status: DC
  Administered 2021-02-19 – 2021-02-21 (×3): 40 mg via SUBCUTANEOUS
  Filled 2021-02-18 (×3): qty 0.4

## 2021-02-18 MED ORDER — LATANOPROST 0.005 % OP SOLN
1.0000 [drp] | Freq: Two times a day (BID) | OPHTHALMIC | Status: DC
  Administered 2021-02-18 – 2021-02-22 (×8): 1 [drp] via OPHTHALMIC
  Filled 2021-02-18: qty 2.5

## 2021-02-18 MED ORDER — SODIUM CHLORIDE 0.9 % IV BOLUS
500.0000 mL | Freq: Once | INTRAVENOUS | Status: AC
  Administered 2021-02-18: 500 mL via INTRAVENOUS

## 2021-02-18 MED ORDER — HYDROXYZINE HCL 25 MG PO TABS
100.0000 mg | ORAL_TABLET | Freq: Every day | ORAL | Status: DC
  Administered 2021-02-18 – 2021-02-21 (×4): 100 mg via ORAL
  Filled 2021-02-18 (×4): qty 4

## 2021-02-18 MED ORDER — HYDROCODONE-ACETAMINOPHEN 5-325 MG PO TABS: 1.0000 | ORAL_TABLET | ORAL | Status: DC | PRN

## 2021-02-18 MED ORDER — AMLODIPINE BESYLATE 10 MG PO TABS
10.0000 mg | ORAL_TABLET | Freq: Every day | ORAL | Status: DC
  Administered 2021-02-19 – 2021-02-22 (×4): 10 mg via ORAL
  Filled 2021-02-18 (×4): qty 1

## 2021-02-18 MED ORDER — GABAPENTIN 300 MG PO CAPS
300.0000 mg | ORAL_CAPSULE | Freq: Two times a day (BID) | ORAL | Status: DC
  Administered 2021-02-18 – 2021-02-19 (×2): 300 mg via ORAL
  Filled 2021-02-18 (×2): qty 1

## 2021-02-18 NOTE — Plan of Care (Signed)
  Problem: Education: Goal: Knowledge of General Education information will improve Description Including pain rating scale, medication(s)/side effects and non-pharmacologic comfort measures Outcome: Progressing   

## 2021-02-18 NOTE — Progress Notes (Signed)
Pt arrived on the unit, A/Ox4, VS stable, RA. Tremors noted legs mostly and the face/lips. States it is normal.  Scabs/sores, small red, nonhealing are throughout his body. Left abdoment 3-4 cm away from the umbilicus at location of 2 oclock - red, nonhealing open sore.  Bed in low position, alarms are on, call bell in reach

## 2021-02-18 NOTE — ED Triage Notes (Signed)
Pt to ED via EMS from [facility] c/o AMS, unknown when this started , pt baseline a&o x 4, able to answer questions appropriately with EMS. 1 emesis episode at breakfast. Pt voicing no complaints. 140/60, HR 80, 99%RA, CBG 314, TEMP 99.3   [id number], No medications given by EMS.

## 2021-02-18 NOTE — ED Notes (Signed)
Hospitalist at bedside 

## 2021-02-18 NOTE — ED Provider Notes (Signed)
[facility] EMERGENCY DEPARTMENT Provider Note   CSN: [id number] Arrival date & time: 02/18/21  1106     History Chief Complaint  Patient presents with   Altered Mental Status   Emesis    Douglas Bunda. is a 75 y.o. male.  HPI Level 5 caveat secondary to confusion History obtained from EMS 75 year old male history of bipolar disorder, prostate cancer, hypertension, TIA, presents today with reports of altered mental status although last known normal is unclear.  The assisted living care facility reported to EMS that the patient came into breakfast this morning and vomited.  He then appeared to be somewhat confused from baseline.  EMS reports that his tympanic temperature was 99.  Otherwise he did seem to be alert to them.     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[place])    Mural thrombus of cardiac apex    Presence of permanent cardiac pacemaker 2017   SA node dysfunction   Sleep apnea    does not use CPAP   TIA (transient ischemic attack)     Patient Active Problem List   Diagnosis Date Noted   OSA (obstructive sleep apnea) 11/26/2019   Cardiac pacemaker 04/30/2019   History of sinoatrial node dysfunction 04/30/2019   Other male erectile dysfunction 11/12/2018   Near syncope 05/22/2017   Coronary artery disease involving native coronary artery of native heart 01/10/2017   Low serum testosterone level 06/16/2016   Obesity (BMI 30-39.9) 03/09/2016   Syncope 07/19/2015   Sepsis ([place]) 07/07/2015   Bipolar I disorder ([place]) 06/24/2015   Bilateral pneumonia 06/23/2015   Bipolar affective disorder, currently manic, mild ([place]) 06/18/2015   History of duodenal ulcer 06/18/2015   Vaccine counseling 06/18/2015   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    Bradycardia 02/08/2015   Hyperlipidemia, mixed 10/14/2014   AF (amaurosis fugax) 09/16/2014   Benign essential hypertension 09/16/2014   Chest pain 09/16/2014   Skin cyst 08/06/2012   Personal history of lymphoma 08/06/2012   Cancer ([place])     Past Surgical History:  Procedure Laterality Date   CHOLECYSTECTOMY     COLONOSCOPY WITH PROPOFOL N/A 01/31/2017   Procedure: COLONOSCOPY WITH PROPOFOL;  Surgeon: Manya Silvas, MD;  Location: [facility] ENDOSCOPY;  Service: Endoscopy;  Laterality: N/A;   DRUG INDUCED ENDOSCOPY N/A 05/12/2020   Procedure: DRUG INDUCED ENDOSCOPY;  Surgeon: Melida Quitter, MD;  Location: [place];  Service: ENT;  Laterality: N/A;   IMPLANTATION OF HYPOGLOSSAL NERVE STIMULATOR N/A 07/21/2020   Procedure: IMPLANTATION OF HYPOGLOSSAL NERVE STIMULATOR;  Surgeon: Melida Quitter, MD;  Location: [place];  Service: ENT;  Laterality: N/A;   LEG SURGERY Left    distal   Pace maker placement     PROSTATE SURGERY     PROSTATECTOMY     SKIN CANCER EXCISION  2006,2007   Merkle Cell Carcinoma       Family History  Problem Relation Age of Onset   Stroke Father    CAD Paternal Grandmother    CAD Paternal Grandfather     Social History   Tobacco Use   Smoking status: Former   Smokeless tobacco: Never  Substance Use Topics   Alcohol use:  No    Alcohol/week: 0.0 standard drinks   Drug use: No    Home Medications Prior to Admission medications   Medication Sig Start Date End Date Taking? Authorizing Provider  amLODipine (NORVASC) 10 MG tablet Take by mouth daily.  01/10/17   [provider]  aspirin EC 81 MG tablet Take 81 mg by mouth daily. Reported on 08/18/2015    [provider]  atorvastatin (LIPITOR) 40 MG tablet Take 1 tablet by mouth 1 day or 1 dose. 01/08/18   [provider]  carbamazepine (TEGRETOL) 200 MG tablet Take 400 mg by mouth 2 (two) times daily. Patient not taking: Reported on 11/30/2020     [provider]  carvedilol (COREG) 25 MG tablet Take 25 mg by mouth in the morning and at bedtime. 07/11/19 11/30/20  [provider]  cloNIDine (CATAPRES) 0.1 MG tablet Take 0.1 mg by mouth in the morning, at noon, and at bedtime. 08/11/19 11/30/20  [provider]  diphenhydrAMINE (BENADRYL) 50 MG capsule Take 50 mg by mouth at bedtime as needed.    [provider]  dorzolamide-timolol (COSOPT) 22.3-6.8 MG/ML ophthalmic solution Place 1 drop into the right eye in the morning and at bedtime. 04/15/20   [provider]  latanoprost (XALATAN) 0.005 % ophthalmic solution Place 1 drop into the right eye 2 (two) times daily.    [provider]  lisinopril (PRINIVIL,ZESTRIL) 40 MG tablet Take 40 mg by mouth 2 (two) times daily.     [provider]  Multiple Vitamin (MULTIVITAMIN WITH MINERALS) TABS tablet Take 1 tablet by mouth daily.    [provider]  Omega-3 Fatty Acids (FISH OIL) 1000 MG CAPS Take 1,000 mg by mouth 2 (two) times daily.     [provider]  QUEtiapine (SEROQUEL) 300 MG tablet Take 600 mg by mouth at bedtime.    [provider]  spironolactone (ALDACTONE) 25 MG tablet Take 25 mg by mouth daily.    [provider]    Allergies    Other, Feldene [piroxicam], and Sulfa antibiotics  Review of Systems   Review of Systems  Physical Exam Updated Vital Signs BP (!) 157/59   Pulse 83   Temp (!) 97.1 F (36.2 C) (Rectal)   Resp (!) 26   Ht 1.829 m (6')   Wt 99.8 kg   SpO2 100%   BMI 29.84 kg/m   Physical Exam Vitals and nursing note reviewed.  Constitutional:      General: He is not in acute distress.    Appearance: Normal appearance. He is ill-appearing.  HENT:     Head: Normocephalic.     Right Ear: External ear normal.     Left Ear: External ear normal.     Nose: Nose normal.     Mouth/Throat:     Mouth: Mucous membranes are dry.     Pharynx: Oropharynx is clear.  Eyes:      Extraocular Movements: Extraocular movements intact.     Pupils: Pupils are equal, round, and reactive to light.  Cardiovascular:     Rate and Rhythm: Normal rate and regular rhythm.     Pulses: Normal pulses.  Pulmonary:     Effort: Pulmonary effort is normal.     Breath sounds: Normal breath sounds.  Abdominal:     General: Abdomen is flat. Bowel sounds are normal.     Palpations: Abdomen is soft.  Musculoskeletal:        General: Normal range  of motion.     Cervical back: Normal range of motion.  Skin:    General: Skin is warm and dry.     Capillary Refill: Capillary refill takes less than 2 seconds.  Neurological:     General: No focal deficit present.     Mental Status: He is alert.     Comments: Patient is oriented to person but not to place or date.  He thinks he is still in his assisted living care facility.  Psychiatric:        Attention and Perception: He is inattentive.        Mood and Affect: Mood normal.        Speech: Speech is delayed.        Behavior: Behavior is cooperative.        Cognition and Memory: Cognition is impaired.    ED Results / Procedures / Treatments   Labs (all labs ordered are listed, but only abnormal results are displayed) Labs Reviewed  CBC - Abnormal; Notable for the following components:      Result Value   RBC 3.52 (*)    Hemoglobin 11.4 (*)    HCT 32.0 (*)    All other components within normal limits  COMPREHENSIVE METABOLIC PANEL - Abnormal; Notable for the following components:   Sodium 117 (*)    Chloride 82 (*)    Glucose, Bld 124 (*)    Calcium 8.6 (*)    AST 59 (*)    All other components within normal limits  RESP PANEL BY RT-PCR (FLU A&B, COVID) ARPGX2  URINALYSIS, ROUTINE W REFLEX MICROSCOPIC  VALPROIC ACID LEVEL    EKG EKG Interpretation  Date/Time:  Friday February 18 2021 11:21:21 EDT Ventricular Rate:  85 PR Interval:  203 QRS Duration: 163 QT Interval:  430 QTC Calculation: 512 R Axis:   -56 Text  Interpretation: Sinus rhythm RBBB and LAFB Left ventricular hypertrophy No significant change since last tracing Confirmed by Pattricia Boss [phone]) on 02/18/2021 12:58:53 PM  Radiology CT Head Wo Contrast  Result Date: 02/18/2021 CLINICAL DATA:  Mental status change, unknown cause EXAM: CT HEAD WITHOUT CONTRAST TECHNIQUE: Contiguous axial images were obtained from the base of the skull through the vertex without intravenous contrast. COMPARISON:  07/10/2020 FINDINGS: Motion artifact is present. Brain: There is no acute intracranial hemorrhage, mass effect, or edema. 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 Stable findings of probable chronic microvascular ischemic changes in the cerebral white matter. Ventricles and sulci are prominent reflecting stable parenchymal volume loss. There is no extra-axial collection. Vascular: There is atherosclerotic calcification at the skull base. Skull: Calvarium is unremarkable. Sinuses/Orbits: No acute finding. Other: None. IMPRESSION: No acute intracranial abnormality. Stable chronic/nonemergent findings detailed above. Electronically Signed   By: Macy Mis M.D.   On: 02/18/2021 11:48   DG Chest Port 1 View  Result Date: 02/18/2021 CLINICAL DATA:  Weakness Fever EXAM: PORTABLE CHEST 1 VIEW COMPARISON:  07/21/2020 FINDINGS: Unchanged mild cardiomegaly. Left chest wall pacemaker is again seen. Right chest wall battery pack with lead extending cranially again seen. No pulmonary vascular congestion.  Lungs clear. IMPRESSION: Mild unchanged cardiomegaly. Electronically Signed   By: Miachel Roux M.D.   On: 02/18/2021 12:20    Procedures .Critical Care Performed by: Pattricia Boss, MD Authorized by: Pattricia Boss, MD   Critical care provider statement:    Critical care time (minutes):  60   Critical care end time:  02/18/2021 2:09 PM   Critical care  was necessary to treat or prevent imminent or life-threatening deterioration of the following  conditions:  CNS failure or compromise and metabolic crisis   Critical care was time spent personally by me on the following activities:  Discussions with consultants, evaluation of patient's response to treatment, examination of patient, ordering and review of laboratory studies, ordering and review of radiographic studies, pulse oximetry, re-evaluation of patient's condition and review of old charts   Medications Ordered in ED Medications  sodium chloride 0.9 % bolus 500 mL (500 mLs Intravenous New Bag/Given 02/18/21 1203)  ondansetron (ZOFRAN) injection 4 mg (4 mg Intravenous Given 02/18/21 1203)    ED Course  I have reviewed the triage vital signs and the nursing notes.  Pertinent labs & imaging results that were available during my care of the patient were reviewed by me and considered in my medical decision making (see chart for details).  Clinical Course as of 02/18/21 1302  Fri Feb 18, 2021  1237 CBC reviewed and interpreted with mild anemia [DR]  1237 Urine reviewed and shows no evidence of acute infection with negative dip for leukocytes, nitrites, hemoglobin [DR]  1237 C-Met reviewed and interpreted with hyponatremia likely acute superimposed on chronic. Chest x-Martika Egler reviewed and interpreted without acute changes and reviewed radiologist interpretation of cardiomegaly on CT reviewed [DR]    Clinical Course User Index [DR] Pattricia Boss, MD   MDM Rules/Calculators/A&P                           75 year old male history of bipolar disorder presents today from assisted living care facility with reports that he has an altered mental status.  Here on exam patient appears somewhat confused without focal neurological deficits.  On work-up the patient is found to have a sodium of 117.  Last sodium obtained August 1 and was 127.  Appears to be chronic with some superimposed acute hyponatremia.  This to be consistent with his exam.  Plan to began normal saline and consult admission  team. Care discussed with Dr. Lorin Mercy and she will see and evaluate patient Final Clinical Impression(s) / ED Diagnoses Final diagnoses:  None    Rx / DC Orders ED Discharge Orders     None        Pattricia Boss, MD 02/18/21 1409

## 2021-02-18 NOTE — H&P (Signed)
History and Physical    Douglas Perkins. NUU:[id number] DOB: 17-Jan-1946 DOA: 02/18/2021  PCP: [name], MD Consultants:  Grayland Ormond - oncology; Nehemiah Massed - cardiology; Redmond Baseman - ENT; Schnier - vascular; Evelina Dun - psychiatry Patient coming from: [facility]; NOK:  Wife, Kalmen Lollar, [phone]  Chief Complaint: AMS  HPI: Douglas Perkins. is a 75 y.o. male with medical history significant of bipolar d/o; Merkel cell CA; prostate CA; HTN; pacemaker placement; and OSA not on CPAP presenting with AMS.  He reports that he was nauseated when he got up this AM.  His wife reports that he had an episode in the dining room - vomited in the dining room, hard to get him back to his room, dragging a foot (?), called 911.  He feels fine now.  His wife notes that is confused.  He is "bipolar so he's a little wacko all the time."  He was admitted at [place] in May and he had issues then - medical floor 2 days and psych floor 16-17 and changed some psych meds.  He used to take Tegretol from 1992 until [place], changed to valproid acid.      ED Course: h/o bipolar but confused this AM.  Na++ 117, last 127 8/1, giving NS.  Nonfocal, uncertain baseline.  Review of Systems: As per HPI; otherwise review of systems reviewed and negative.     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[place])    Mural thrombus of cardiac apex    Presence of permanent cardiac pacemaker 2017   SA node dysfunction   Sleep apnea    does not use CPAP   TIA (transient ischemic attack)     Past Surgical History:  Procedure Laterality Date   CHOLECYSTECTOMY     COLONOSCOPY WITH PROPOFOL N/A 01/31/2017   Procedure: COLONOSCOPY WITH PROPOFOL;  Surgeon: Manya Silvas, MD;  Location: [facility] ENDOSCOPY;  Service: Endoscopy;  Laterality: N/A;   DRUG INDUCED ENDOSCOPY N/A 05/12/2020   Procedure: DRUG INDUCED ENDOSCOPY;   Surgeon: Melida Quitter, MD;  Location: [place];  Service: ENT;  Laterality: N/A;   IMPLANTATION OF HYPOGLOSSAL NERVE STIMULATOR N/A 07/21/2020   Procedure: IMPLANTATION OF HYPOGLOSSAL NERVE STIMULATOR;  Surgeon: Melida Quitter, MD;  Location: [place];  Service: ENT;  Laterality: N/A;   LEG SURGERY Left    distal   Pace maker placement     PROSTATE SURGERY     PROSTATECTOMY     SKIN CANCER EXCISION  2006,2007   Merkle Cell Carcinoma    Social History   Socioeconomic History   Marital status: Married    Spouse name: Not on file   Number of children: Not on file   Years of education: Not on file   Highest education level: Not on file  Occupational History   Occupation: disabled  Tobacco Use   Smoking status: Never   Smokeless tobacco: Never  Substance and Sexual Activity   Alcohol use: No    Alcohol/week: 0.0 standard drinks   Drug use: No   Sexual activity: Not on file  Other Topics Concern   Not on file  Social History Narrative   Not on file   Social Determinants of Health   Financial Resource Strain: Not on file  Food Insecurity: Not on file  Transportation Needs:  Not on file  Physical Activity: Not on file  Stress: Not on file  Social Connections: Not on file  Intimate Partner Violence: Not on file    Allergies  Allergen Reactions   Other Hives, Shortness Of Breath and Other (See Comments)    Pt states that he is allergic to unwashed blood products.   Other reaction(s): Other (See Comments) UNWASHED BLOOD PRODUCTS  Pt states that he is allergic to unwashed blood products.     Feldene [Piroxicam] Hives   Sulfa Antibiotics Hives    Family History  Problem Relation Age of Onset   Stroke Father    CAD Paternal Grandmother    CAD Paternal Grandfather     Prior to Admission medications   Medication Sig Start Date End Date Taking? Authorizing Provider  amLODipine (NORVASC) 10 MG tablet Take 10 mg by mouth daily. 01/10/17   Yes [provider]  aspirin EC 81 MG tablet Take 81 mg by mouth daily. Reported on 08/18/2015   Yes [provider]  atorvastatin (LIPITOR) 40 MG tablet Take 1 tablet by mouth 1 day or 1 dose. 01/08/18  Yes [provider]  carvedilol (COREG) 25 MG tablet Take 25 mg by mouth in the morning and at bedtime. 07/11/19 02/18/21 Yes [provider]  cloNIDine (CATAPRES) 0.1 MG tablet Take 0.1 mg by mouth 2 (two) times daily. 08/11/19 02/18/21 Yes [provider]  gabapentin (NEURONTIN) 300 MG capsule Take 300 mg by mouth 2 (two) times daily.   Yes [provider]  hydrOXYzine (ATARAX/VISTARIL) 50 MG tablet Take 100 mg by mouth at bedtime.   Yes [provider]  latanoprost (XALATAN) 0.005 % ophthalmic solution Place 1 drop into the right eye 2 (two) times daily.   Yes [provider]  lisinopril (PRINIVIL,ZESTRIL) 40 MG tablet Take 40 mg by mouth daily.   Yes [provider]  Multiple Vitamin (MULTIVITAMIN WITH MINERALS) TABS tablet Take 1 tablet by mouth daily.   Yes [provider]  OLANZapine (ZYPREXA) 5 MG tablet Take 5 mg by mouth at bedtime.   Yes [provider]  Omega-3 Fatty Acids (FISH OIL) 1000 MG CAPS Take 1,000 mg by mouth 2 (two) times daily.    Yes [provider]  pantoprazole (PROTONIX) 40 MG tablet Take 40 mg by mouth 2 (two) times daily.   Yes [provider]  QUEtiapine (SEROQUEL) 300 MG tablet Take 300 mg by mouth at bedtime.   Yes [provider]  sertraline (ZOLOFT) 25 MG tablet Take 25 mg by mouth at bedtime.   Yes [provider]  spironolactone (ALDACTONE) 25 MG tablet Take 25 mg by mouth daily.   Yes [provider]  valproic acid (DEPAKENE) 250 MG/5ML solution Take 750 mg by mouth at bedtime.   Yes [provider]    Physical Exam: Vitals:   02/18/21 1530 02/18/21 1545 02/18/21 1700 02/18/21 1834  BP: (!) 147/70 (!) 142/69  (!) 153/71 (!) 142/61  Pulse: 89 80 88 83  Resp: (!) 22 (!) 28 (!) 27 20  Temp:    98.4 F (36.9 C)  TempSrc:    Axillary  SpO2: 97% 100% 99% 99%  Weight:      Height:    6' (1.829 m)     General:  Appears calm and comfortable and is in NAD; somewhat eccentric; periodic myoclonic jerks and facial tics - acute vs. Chronic (more likely) Eyes:  PERRL, EOMI, normal lids, iris ENT:  grossly normal hearing, lips & tongue, mmm Neck:  no LAD, masses or thyromegaly Cardiovascular:  RRR, no m/r/g. Tr to 1+ LE edema.  Respiratory:   CTA bilaterally with no wheezes/rales/rhonchi.  Normal respiratory effort. Abdomen:  soft, NT, ND Skin:  no rash or induration seen on limited exam Musculoskeletal:  grossly normal tone BUE/BLE other than R lower leg atrophy from radiation for Merkel cell CA, good ROM, no bony abnormality Psychiatric:  eccentric mood and affect, speech fluent and appropriate, +tics, generally reasonable history Neurologic:  CN 2-12 grossly intact, moves all extremities in coordinated fashion with periodic jerks and facial tics    Radiological Exams on Admission: Independently reviewed - see discussion in A/P where applicable  CT Head Wo Contrast  Result Date: 02/18/2021 CLINICAL DATA:  Mental status change, unknown cause EXAM: CT HEAD WITHOUT CONTRAST TECHNIQUE: Contiguous axial images were obtained from the base of the skull through the vertex without intravenous contrast. COMPARISON:  07/10/2020 FINDINGS: Motion artifact is present. Brain: There is no acute intracranial hemorrhage, mass effect, or edema. 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 Stable findings of probable chronic microvascular ischemic changes in the cerebral white matter. Ventricles and sulci are prominent reflecting stable parenchymal volume loss. There is no extra-axial collection. Vascular: There is atherosclerotic calcification at the skull base. Skull: Calvarium is unremarkable. Sinuses/Orbits:  No acute finding. Other: None. IMPRESSION: No acute intracranial abnormality. Stable chronic/nonemergent findings detailed above. Electronically Signed   By: Macy Mis M.D.   On: 02/18/2021 11:48   DG Chest Port 1 View  Result Date: 02/18/2021 CLINICAL DATA:  Weakness Fever EXAM: PORTABLE CHEST 1 VIEW COMPARISON:  07/21/2020 FINDINGS: Unchanged mild cardiomegaly. Left chest wall pacemaker is again seen. Right chest wall battery pack with lead extending cranially again seen. No pulmonary vascular congestion.  Lungs clear. IMPRESSION: Mild unchanged cardiomegaly. Electronically Signed   By: Miachel Roux M.D.   On: 02/18/2021 12:20    EKG: Independently reviewed.  NSR with rate 85; RBBB and LAFB; LVH   Labs on Admission: I have personally reviewed the available labs and imaging studies at the time of the admission.  Pertinent labs:   Na++ 117 -> 119; 127 on 8/1; 126-136 at baseline Glucose 125 AST 59/ALT 41; normal on 8/1 WBC 8.9 Hgb 11.4 Valproic acid <10 UA WNL   Assessment/Plan Principal Problem:   Acute metabolic encephalopathy Active Problems:   Benign essential hypertension   Hyperlipidemia, mixed   Bipolar I disorder (HCC)   Cardiac pacemaker   OSA (obstructive sleep apnea)   Hyponatremia syndrome   Acute metabolic encephalopathy -Patient presenting with confusion -While the patient does have underlying bipolar, this is a change compared to his usual baseline mental status -Evaluation thus far unremarkable other than significant hyponatremia -Will admit to progressive care for ongoing monitoring  Hyponatremia, mixed -Etiology appears to be mixed hyponatremia -This likely evolved as a result of psychogenic polydipsia as well as spironolactone in conjunction with vomiting -Will check urinary and serum Osm as well as urinary sodium and potassium levels -His baseline Na++ appears to be 126-136 -Will treat with gentle IVF hydration and holding diuretics -Will monitor  sodium q4h to attempt to avoid overcorrection (>84mEq/L/day) so as to avoid central pontine myelinolysis  Bipolar d/o -Patient was changed from long-term Tegretol to Valproic acid; wife reports compliance at SNF but his level is undetectable -Will resume Depakote -Continue Neurontin, hydroxyzine, Zoloft, Seroquel -He is on both Zyprexa and high-dose Seroquel; will hold Zyprexa to  decrease the risk of serotonin syndrome (which could also cause AMS)  HTN -Continue Norvasc, Coreg, Catapres -Hold Lisinopril and spironolactone for now  HLD -Continue Lipitor -Hold fish oil due to limited inpatient utility  Pacemaker/CAD -Continue ASA  OSA -Not on CPAP  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   Level of care: Progressive DVT prophylaxis:  Lovenox  Code Status:  Full - confirmed with patient/family Family Communication: Wife was present throughout evaluation Disposition Plan:  The patient is from: ALF  Anticipated d/c is to: ALF  Anticipated d/c date will depend on clinical response to treatment, but possibly as early as tomorrow if he has excellent response to treatment  Patient is currently: acutely ill Consults called: None  Admission status:  It is my clinical opinion that referral for OBSERVATION is reasonable and necessary in this patient based on the above information provided. The aforementioned taken together are felt to place the patient at high risk for further clinical deterioration. However it is anticipated that the patient may be medically stable for discharge from the hospital within 24 to 48 hours.    Karmen Bongo MD [facility]   How to contact the [facility] Attending or Consulting provider [place] or covering provider during after hours [place], for this patient?  Check the care team in [facility] and look for a) attending/consulting [facility] provider  listed and b) the [facility] team listed Log into [email or web address] and use [place]'s universal password to access. If you do not have the password, please contact the hospital operator. Locate the [facility] provider you are looking for under [facility] and page to a number that you can be directly reached. If you still have difficulty reaching the provider, please page the [facility] (Director on Call) for the Hospitalists listed on amion for assistance.   02/18/2021, 6:58 PM

## 2021-02-19 DIAGNOSIS — E222 Syndrome of inappropriate secretion of antidiuretic hormone: Secondary | ICD-10-CM | POA: Diagnosis present

## 2021-02-19 DIAGNOSIS — G4733 Obstructive sleep apnea (adult) (pediatric): Secondary | ICD-10-CM | POA: Diagnosis present

## 2021-02-19 DIAGNOSIS — Z888 Allergy status to other drugs, medicaments and biological substances status: Secondary | ICD-10-CM | POA: Diagnosis not present

## 2021-02-19 DIAGNOSIS — L8990 Pressure ulcer of unspecified site, unspecified stage: Secondary | ICD-10-CM | POA: Insufficient documentation

## 2021-02-19 DIAGNOSIS — I10 Essential (primary) hypertension: Secondary | ICD-10-CM | POA: Diagnosis not present

## 2021-02-19 DIAGNOSIS — Z20822 Contact with and (suspected) exposure to covid-19: Secondary | ICD-10-CM | POA: Diagnosis present

## 2021-02-19 DIAGNOSIS — Z8673 Personal history of transient ischemic attack (TIA), and cerebral infarction without residual deficits: Secondary | ICD-10-CM | POA: Diagnosis not present

## 2021-02-19 DIAGNOSIS — L89311 Pressure ulcer of right buttock, stage 1: Secondary | ICD-10-CM | POA: Diagnosis present

## 2021-02-19 DIAGNOSIS — Z8546 Personal history of malignant neoplasm of prostate: Secondary | ICD-10-CM | POA: Diagnosis not present

## 2021-02-19 DIAGNOSIS — Z950 Presence of cardiac pacemaker: Secondary | ICD-10-CM

## 2021-02-19 DIAGNOSIS — Z87891 Personal history of nicotine dependence: Secondary | ICD-10-CM | POA: Diagnosis not present

## 2021-02-19 DIAGNOSIS — E782 Mixed hyperlipidemia: Secondary | ICD-10-CM | POA: Diagnosis present

## 2021-02-19 DIAGNOSIS — I452 Bifascicular block: Secondary | ICD-10-CM | POA: Diagnosis present

## 2021-02-19 DIAGNOSIS — I119 Hypertensive heart disease without heart failure: Secondary | ICD-10-CM | POA: Diagnosis present

## 2021-02-19 DIAGNOSIS — F319 Bipolar disorder, unspecified: Secondary | ICD-10-CM

## 2021-02-19 DIAGNOSIS — Z823 Family history of stroke: Secondary | ICD-10-CM | POA: Diagnosis not present

## 2021-02-19 DIAGNOSIS — Z882 Allergy status to sulfonamides status: Secondary | ICD-10-CM | POA: Diagnosis not present

## 2021-02-19 DIAGNOSIS — Z8249 Family history of ischemic heart disease and other diseases of the circulatory system: Secondary | ICD-10-CM | POA: Diagnosis not present

## 2021-02-19 DIAGNOSIS — Z85821 Personal history of Merkel cell carcinoma: Secondary | ICD-10-CM | POA: Diagnosis not present

## 2021-02-19 DIAGNOSIS — H409 Unspecified glaucoma: Secondary | ICD-10-CM | POA: Diagnosis present

## 2021-02-19 DIAGNOSIS — Z7982 Long term (current) use of aspirin: Secondary | ICD-10-CM | POA: Diagnosis not present

## 2021-02-19 DIAGNOSIS — R4182 Altered mental status, unspecified: Secondary | ICD-10-CM | POA: Diagnosis present

## 2021-02-19 DIAGNOSIS — G9341 Metabolic encephalopathy: Secondary | ICD-10-CM | POA: Diagnosis present

## 2021-02-19 DIAGNOSIS — Z79899 Other long term (current) drug therapy: Secondary | ICD-10-CM | POA: Diagnosis not present

## 2021-02-19 DIAGNOSIS — I2510 Atherosclerotic heart disease of native coronary artery without angina pectoris: Secondary | ICD-10-CM | POA: Diagnosis present

## 2021-02-19 LAB — CBC
HCT: 28.7 % — ABNORMAL LOW (ref 39.0–52.0)
Hemoglobin: 10.5 g/dL — ABNORMAL LOW (ref 13.0–17.0)
MCH: 33 pg (ref 26.0–34.0)
MCHC: 36.6 g/dL — ABNORMAL HIGH (ref 30.0–36.0)
MCV: 90.3 fL (ref 80.0–100.0)
Platelets: 341 10*3/uL (ref 150–400)
RBC: 3.18 MIL/uL — ABNORMAL LOW (ref 4.22–5.81)
RDW: 12.7 % (ref 11.5–15.5)
WBC: 8.4 10*3/uL (ref 4.0–10.5)
nRBC: 0 % (ref 0.0–0.2)

## 2021-02-19 LAB — BASIC METABOLIC PANEL
Anion gap: 11 (ref 5–15)
Anion gap: 7 (ref 5–15)
BUN: 9 mg/dL (ref 8–23)
BUN: 9 mg/dL (ref 8–23)
CO2: 20 mmol/L — ABNORMAL LOW (ref 22–32)
CO2: 23 mmol/L (ref 22–32)
Calcium: 8.2 mg/dL — ABNORMAL LOW (ref 8.9–10.3)
Calcium: 8.1 mg/dL — ABNORMAL LOW (ref 8.9–10.3)
Chloride: 92 mmol/L — ABNORMAL LOW (ref 98–111)
Chloride: 94 mmol/L — ABNORMAL LOW (ref 98–111)
Creatinine, Ser: 0.7 mg/dL (ref 0.61–1.24)
Creatinine, Ser: 0.64 mg/dL (ref 0.61–1.24)
GFR, Estimated: >60 mL/min (ref >60)
GFR, Estimated: >60 mL/min (ref >60)
Glucose, Bld: 84 mg/dL (ref 70–99)
Glucose, Bld: 126 mg/dL — ABNORMAL HIGH (ref 70–99)
Potassium: 4.4 mmol/L (ref 3.5–5.1)
Potassium: 4.1 mmol/L (ref 3.5–5.1)
Sodium: 123 mmol/L — ABNORMAL LOW (ref 135–145)
Sodium: 124 mmol/L — ABNORMAL LOW (ref 135–145)

## 2021-02-19 LAB — MRSA NEXT GEN BY PCR, NASAL: MRSA by PCR Next Gen: NOT DETECTED

## 2021-02-19 NOTE — Evaluation (Signed)
Occupational Therapy Evaluation Patient Details Name: Douglas Perkins. MRN: [id number] DOB: 1945-06-13 Today's Date: 02/19/2021   History of Present Illness 75 y.o. male with history of bipolar disorder, prostate cancer, HTN, TIA who presented with AMS from his ALF on 10/21, found to have hyponatremia.   Clinical Impression   Patient admitted for the above diagnosis.  PTA he resides in an ALF, where medications and meals are provided for him.  He normally walks to the dinning room with a 4WRW, but ambulates in his apartment without the RW.  He does complete his own self care, and his spouse, who lives at their home, assists with community mobility.  Primary deficit is decreased safety awareness.  OT will follow him in the acute setting to maximize his functional status, but no post acute OT is anticipated.         Recommendations for follow up therapy are one component of a multi-disciplinary discharge planning process, led by the attending physician.  Recommendations may be updated based on patient status, additional functional criteria and insurance authorization.   Follow Up Recommendations  No OT follow up    Equipment Recommendations  None recommended by OT    Recommendations for Other Services  PT evaluation     Precautions / Restrictions Precautions Precautions: Fall Restrictions Weight Bearing Restrictions: No      Mobility Bed Mobility Overal bed mobility: Needs Assistance Bed Mobility: Supine to Sit     Supine to sit: Supervision     General bed mobility comments: rocks back and forth to sit. Patient Response: Impulsive;Flat affect  Transfers Overall transfer level: Needs assistance   Transfers: Sit to/from Stand;Stand Pivot Transfers Sit to Stand: Supervision Stand pivot transfers: Min guard       General transfer comment: decreased safety awareness    Balance Overall balance assessment: Needs assistance Sitting-balance support: Feet  supported Sitting balance-Leahy Scale: Good     Standing balance support: Bilateral upper extremity supported Standing balance-Leahy Scale: Poor Standing balance comment: needs RW and safety cues currenlty                           ADL either performed or assessed with clinical judgement   ADL       Grooming: Supervision/safety;Standing       Lower Body Bathing: Supervison/ safety;Sit to/from stand       Lower Body Dressing: Supervision/safety;Sit to/from stand   Toilet Transfer: Min guard;RW                   Vision Baseline Vision/History: 1 Wears glasses Patient Visual Report: No change from baseline       Perception     Praxis      Pertinent Vitals/Pain Pain Assessment: No/denies pain     Hand Dominance Right   Extremity/Trunk Assessment Upper Extremity Assessment Upper Extremity Assessment: Overall WFL for tasks assessed   Lower Extremity Assessment Lower Extremity Assessment: Defer to PT evaluation   Cervical / Trunk Assessment Cervical / Trunk Assessment: Normal   Communication Communication Communication: No difficulties   Cognition Arousal/Alertness: Awake/alert Behavior During Therapy: WFL for tasks assessed/performed Overall Cognitive Status: Impaired/Different from baseline Area of Impairment: Awareness;Safety/judgement;Following commands;Attention                   Current Attention Level: Alternating   Following Commands: Follows one step commands consistently Safety/Judgement: Decreased awareness of safety;Decreased awareness of deficits Awareness: Anticipatory   General  Comments: Safety is the biggest concern   General Comments       Exercises     Shoulder Instructions      Home Living Family/patient expects to be discharged to:: Assisted living                             Home Equipment: Walker - 4 wheels;Shower seat;Grab bars - tub/shower;Hand held shower head          Prior  Functioning/Environment Level of Independence: Independent with assistive device(s)  Gait / Transfers Assistance Needed: Walks in the halls to common dinnig room with 2UQJ.  Does not use the RW in his room. ADL's / Homemaking Assistance Needed: Independent with bathing and dressing in his room.  Medications are provided by staff.  No assist with toileting.            OT Problem List: Impaired balance (sitting and/or standing);Decreased safety awareness      OT Treatment/Interventions: Self-care/ADL training;Therapeutic activities;Balance training;Patient/family education    OT Goals(Current goals can be found in the care plan section) Acute Rehab OT Goals Patient Stated Goal: I'm going back on Monday OT Goal Formulation: With patient Time For Goal Achievement: 03/05/21 Potential to Achieve Goals: Good ADL Goals Pt Will Perform Grooming: with modified independence;standing Pt Will Perform Lower Body Bathing: with modified independence;sit to/from stand Pt Will Perform Lower Body Dressing: with modified independence;sit to/from stand Pt Will Transfer to Toilet: with modified independence;ambulating;regular height toilet  OT Frequency: Min 2X/week   Barriers to D/C:    none noted       Co-evaluation              AM-PAC OT "6 Clicks" Daily Activity     Outcome Measure Help from another person eating meals?: None Help from another person taking care of personal grooming?: None Help from another person toileting, which includes using toliet, bedpan, or urinal?: A Little Help from another person bathing (including washing, rinsing, drying)?: A Little Help from another person to put on and taking off regular upper body clothing?: None Help from another person to put on and taking off regular lower body clothing?: A Little 6 Click Score: 21   End of Session Equipment Utilized During Treatment: Rolling walker Nurse Communication: Mobility status  Activity Tolerance: Patient  tolerated treatment well Patient left: in chair;with call bell/phone within reach;with chair alarm set  OT Visit Diagnosis: Unsteadiness on feet (R26.81)                Time: 3354-5625 OT Time Calculation (min): 18 min Charges:  OT General Charges $OT Visit: 1 Visit OT Evaluation $OT Eval Moderate Complexity: 1 Mod  02/19/2021  RP, OTR/L  Acute Rehabilitation Services  Office:  [phone]   Metta Clines 02/19/2021, 4:26 PM

## 2021-02-19 NOTE — Progress Notes (Addendum)
PROGRESS NOTE        PATIENT DETAILS Name: Douglas Perkins. Age: 75 y.o. Sex: male Date of Birth: 1946-04-10 Admit Date: 02/18/2021 Admitting Physician Karmen Bongo, MD ION:[id number], Lucianne Muss, MD  Brief Narrative: Patient is a 74 y.o. male with history of bipolar disorder, HTN, OSA not on CPAP-presented with confusion-he was found to have hyponatremia.  Subjective: Lying comfortably in bed-denies any chest pain or shortness of breath.  Objective: Vitals: Blood pressure 136/66, pulse 74, temperature 97.7 F (36.5 C), temperature source Oral, resp. rate 18, height 6' (1.829 m), weight 99.8 kg, SpO2 96 %.   Exam: Gen Exam:Alert awake-not in any distress HEENT:atraumatic, normocephalic Chest: B/L clear to auscultation anteriorly CVS:S1S2 regular Abdomen:soft non tender, non distended Extremities:no edema Neurology: Non focal Skin: no rash  Pertinent Labs/Radiology: WBC: 8.4 Hb: 10.5 Na: 124 K: 4.1 Creatinine: 0.64  10/21>> urine osmolality: 285 10/21>> serum osmolality: 255 10/21>> urine sodium: 70  10/21>>CXR: No pneumonia 10/21>> CT head: No acute abnormality.  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  Stop all IVF-we will start fluid restriction-and monitor electrolytes closely.  We will check TSH with a.m. labs.  Acute metabolic encephalopathy: Seems to have improved-etiology felt to be hyponatremia.  Bipolar disorder: Remains on Depakote/Zoloft/Seroquel/hydroxyzine/Neurontin-we will ask psych to evaluate given suspicion for SIADH physiology.  Zyprexa remains on hold.  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  Procedures: None Consults: None DVT Prophylaxis: Lovenox Code Status:Full code Family Communication:Spouse-Frances- [phone] updated on 10/22  Time spent: 39 minutes-Greater than 50% of this time was spent in counseling, explanation of diagnosis, planning of further management, and coordination of care.  Diet: Diet Order             Diet Heart Room service appropriate? Yes; Fluid consistency: Thin  Diet effective now                      Disposition Plan: Status is: Observation  The patient will require care spanning > 2 midnights and should be moved to inpatient because: Severe hyponatremia with confusion-require inpatient level of care.    Barriers to Discharge: Resolving encephalopathy-improving hyponatremia-not yet stable for discharge-sodium levels are not yet back to baseline.  Antimicrobial agents: Anti-infectives (From admission, onward)    None        MEDICATIONS: Scheduled Meds:  amLODipine  10 mg Oral Daily   aspirin EC  81 mg Oral Daily   atorvastatin  40 mg Oral 1 day or 1 dose   carvedilol  25 mg Oral BID WC   cloNIDine  0.1 mg Oral BID   docusate sodium  100 mg Oral BID   enoxaparin (LOVENOX) injection  40 mg Subcutaneous Q24H   gabapentin  300 mg Oral BID   hydrOXYzine  100 mg Oral QHS   latanoprost  1 drop Right Eye BID   pantoprazole  40 mg Oral BID   QUEtiapine  300 mg Oral QHS   sertraline  25 mg Oral QHS   sodium chloride flush  3 mL Intravenous Q12H   valproic acid  750 mg Oral QHS   Continuous Infusions: PRN Meds:.acetaminophen **OR** acetaminophen, albuterol, bisacodyl, hydrALAZINE, HYDROcodone-acetaminophen, morphine injection, ondansetron **OR** ondansetron (ZOFRAN) IV, polyethylene glycol   I have personally reviewed following labs and imaging studies  LABORATORY DATA: CBC: Recent Labs  Lab 02/18/21 1127 02/19/21 0246  WBC 8.9 8.4  HGB  11.4* 10.5*  HCT 32.0* 28.7*  MCV 90.9 90.3  PLT 375 027    Basic Metabolic Panel: Recent Labs  Lab 02/18/21 1127 02/18/21 1621 02/18/21 2120 02/19/21 0246 02/19/21 0743  NA 117* 119* 123* 123* 124*  K 4.6 4.0 4.1 4.4 4.1  CL 82* 88* 91* 92* 94*  CO2 22 19* 23 20* 23  GLUCOSE 124* 114* 112* 84 126*  BUN 9 7* 7* 9 9  CREATININE 0.71 0.60* 0.67 0.70 0.64  CALCIUM 8.6* 8.0* 8.4* 8.2* 8.1*    GFR: Estimated Creatinine Clearance: 94.6 mL/min (by C-G formula based on SCr of 0.64 mg/dL).  Liver Function Tests: Recent Labs  Lab 02/18/21 1127  AST 59*  ALT 41  ALKPHOS 76  BILITOT 1.0  PROT 6.9  ALBUMIN 4.0   No results for input(s): LIPASE, AMYLASE in the last 168 hours. No results for input(s): AMMONIA in the last 168 hours.  Coagulation Profile: No results for input(s): INR, PROTIME in the last 168 hours.  Cardiac Enzymes: No results for input(s): CKTOTAL, CKMB, CKMBINDEX, TROPONINI in the last 168 hours.  BNP (last 3 results) No results for input(s): PROBNP in the last 8760 hours.  Lipid Profile: No results for input(s): CHOL, HDL, LDLCALC, TRIG, CHOLHDL, LDLDIRECT in the last 72 hours.  Thyroid Function Tests: No results for input(s): TSH, T4TOTAL, FREET4, T3FREE, THYROIDAB in the last 72 hours.  Anemia Panel: No results for input(s): VITAMINB12, FOLATE, FERRITIN, TIBC, IRON, RETICCTPCT in the last 72 hours.  Urine analysis:    Component Value Date/Time   COLORURINE YELLOW 02/18/2021 1143   APPEARANCEUR CLEAR 02/18/2021 1143   APPEARANCEUR Clear 05/24/2015 1328   LABSPEC 1.025 02/18/2021 1143   PHURINE 5.0 02/18/2021 1143   GLUCOSEU NEGATIVE 02/18/2021 1143   HGBUR NEGATIVE 02/18/2021 1143   BILIRUBINUR NEGATIVE 02/18/2021 1143   BILIRUBINUR Negative 05/24/2015 1328   KETONESUR NEGATIVE 02/18/2021 1143   PROTEINUR NEGATIVE 02/18/2021 1143   NITRITE NEGATIVE 02/18/2021 1143   LEUKOCYTESUR NEGATIVE 02/18/2021 1143    Sepsis Labs: Lactic Acid,  Venous    Component Value Date/Time   LATICACIDVEN 1.9 11/21/2017 7412    MICROBIOLOGY: Recent Results (from the past 240 hour(s))  Resp Panel by RT-PCR (Flu A&B, Covid) Nasopharyngeal Swab     Status: None   Collection Time: 02/18/21 12:57 PM   Specimen: Nasopharyngeal Swab; Nasopharyngeal(NP) swabs in vial transport medium  Result Value Ref Range Status   SARS Coronavirus 2 by RT PCR NEGATIVE NEGATIVE Final    Comment: (NOTE) SARS-CoV-2 target nucleic acids are NOT DETECTED.  The SARS-CoV-2 RNA is generally detectable in upper respiratory specimens during the acute phase of infection. The lowest concentration of SARS-CoV-2 viral copies this assay can detect is 138 copies/mL. A negative result does not preclude SARS-Cov-2 infection and should not be used as the sole basis for treatment or other patient management decisions. A negative result may occur with  improper specimen collection/handling, submission of specimen other than nasopharyngeal swab, presence of viral mutation(s) within the areas targeted by this assay, and inadequate number of viral copies(<138 copies/mL). A negative result must be combined  with clinical observations, patient history, and epidemiological information. The expected result is Negative.  Fact Sheet for Patients:  EntrepreneurPulse.com.au  Fact Sheet for Healthcare Providers:  IncredibleEmployment.be  This test is no t yet approved or cleared by the [place] FDA and  has been authorized for detection and/or diagnosis of SARS-CoV-2 by FDA under an Emergency Use Authorization (EUA). This EUA will remain  in effect (meaning this test can be used) for the duration of the COVID-19 declaration under Section 564(b)(1) of the Act, 21 U.S.C.section 360bbb-3(b)(1), unless the authorization is terminated  or revoked sooner.       Influenza A by PCR NEGATIVE NEGATIVE Final   Influenza B by PCR NEGATIVE NEGATIVE  Final    Comment: (NOTE) The Xpert Xpress SARS-CoV-2/FLU/RSV plus assay is intended as an aid in the diagnosis of influenza from Nasopharyngeal swab specimens and should not be used as a sole basis for treatment. Nasal washings and aspirates are unacceptable for Xpert Xpress SARS-CoV-2/FLU/RSV testing.  Fact Sheet for Patients: EntrepreneurPulse.com.au  Fact Sheet for Healthcare Providers: IncredibleEmployment.be  This test is not yet approved or cleared by the [place] FDA and has been authorized for detection and/or diagnosis of SARS-CoV-2 by FDA under an Emergency Use Authorization (EUA). This EUA will remain in effect (meaning this test can be used) for the duration of the COVID-19 declaration under Section 564(b)(1) of the Act, 21 U.S.C. section 360bbb-3(b)(1), unless the authorization is terminated or revoked.  Performed at [facility], [place] [address], [place], [place] [address]   MRSA Next Gen by PCR, Nasal     Status: None   Collection Time: 02/18/21 11:40 PM   Specimen: Nasal Mucosa; Nasal Swab  Result Value Ref Range Status   MRSA by PCR Next Gen NOT DETECTED NOT DETECTED Final    Comment: (NOTE) The GeneXpert MRSA Assay (FDA approved for NASAL specimens only), is one component of a comprehensive MRSA colonization surveillance program. It is not intended to diagnose MRSA infection nor to guide or monitor treatment for MRSA infections. Test performance is not FDA approved in patients less than 5 years old. Performed at [facility], [place] [address] [place],  [address]     RADIOLOGY STUDIES/RESULTS: CT Head Wo Contrast  Result Date: 02/18/2021 CLINICAL DATA:  Mental status change, unknown cause EXAM: CT HEAD WITHOUT CONTRAST TECHNIQUE: Contiguous axial images were obtained from the base of the skull through the vertex without intravenous contrast. COMPARISON:  07/10/2020 FINDINGS: Motion artifact is  present. Brain: There is no acute intracranial hemorrhage, mass effect, or edema. 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 Stable findings of probable chronic microvascular ischemic changes in the cerebral white matter. Ventricles and sulci are prominent reflecting stable parenchymal volume loss. There is no extra-axial collection. Vascular: There is atherosclerotic calcification at the skull base. Skull: Calvarium is unremarkable. Sinuses/Orbits: No acute finding. Other: None. IMPRESSION: No acute intracranial abnormality. Stable chronic/nonemergent findings detailed above. Electronically Signed   By: Macy Mis M.D.   On: 02/18/2021 11:48   DG Chest Port 1 View  Result Date: 02/18/2021 CLINICAL DATA:  Weakness Fever EXAM: PORTABLE CHEST 1 VIEW COMPARISON:  07/21/2020 FINDINGS: Unchanged mild cardiomegaly. Left chest wall pacemaker is again seen. Right chest wall battery pack with lead extending cranially again seen. No pulmonary vascular congestion.  Lungs clear. IMPRESSION: Mild unchanged cardiomegaly. Electronically Signed   By: Miachel Roux M.D.   On: 02/18/2021 12:20     LOS: 0 days  Oren Binet, MD  [facility]    To contact the attending provider between 7A-7P or the covering provider during after hours 7P-7A, please log into the web site [email or web address] and access using universal [place] password for that web site. If you do not have the password, please call the hospital operator.  02/19/2021, 11:41 AM

## 2021-02-19 NOTE — Consult Note (Signed)
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  Please evaluate for adjustment of psych medications likely causing SIADH. Referring Physician:  Dr. Sloan Leiter Patient Identification: Douglas Perkins. MRN:  [id number] Principal Diagnosis: Acute metabolic encephalopathy Diagnosis:  Principal Problem:   Acute metabolic encephalopathy Active Problems:   Benign essential hypertension   Hyperlipidemia, mixed   Bipolar I disorder (HCC)   Cardiac pacemaker   OSA (obstructive sleep apnea)   Hyponatremia syndrome   Total Time spent with patient: 30 minutes  Subjective:   Douglas Perkins. is a 75 y.o. male with history of bipolar disorder, prostate cancer, HTN, TIA who presented with AMS from his ALF on 10/21. Patient reported that he had vomited to EMS prior to their arrival. Labs revealed sodium of 117 and was admitted to the hospitalist service for further care. Psychiatry was consulted for medication management as psychiatric medications may have contributed/caused  SIADH.  Upon interview this morning, he his found laying in bed in NAD. He is calm, cooperative and pleasant. He is AAO x 4. He states he came to the hospital initially due to vomiting and indicates that he is feeling better now. Patient reports a history of bipolar disorder and is a poor historian regarding his medications-see below. He states that his mood os "ok" an reports sleeping well. He denies SI/HI/AVH. He denies feelign depressed, he denies issues with appetite, anhedonia. He denies paranoia. He states that his medications were recently changed after an inpatient psychiatric hospitalization earlier this year; however, he is unable to recall when this occurred. He states that he had low sodium and then was tranferred to the inpatient psychiatric unit once medically well. He is unable to name his psychiatric  medications although reports taking gabapentin. He states that he lives at [facility] which is an assisted living facility and reports compliance with all medications even though he is unable to name them . He does report that he took trileptal up until recently when it was changed during a recent hospitalization.    Per chart review, he had a recent visit with his outaptient provicer on 02/08/21 which lists current medications as  seroquel 300 mg, valproic acid 750 mg qhs and recently added zoloft 25 mg. Bzsed on documentation.  zoloft 25 mg was added at this visit due to reported compulsive behaviors.  Patient states that he lives at [facility] which is an assisted living facility. He states that his medications are administered by staff and he reports compliance. Of note, VPA <10 on admit lab work which suggests otherwise.   Obtained from chart review and patient Past Psychiatric History: Previous Medication Trials: tegretol, seroquel, zoloft, gabapentin, valproic acid, patient unable to name others. 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  HPI per primary team: Douglas Perkins. is a 75 y.o. male with medical history  significant of bipolar d/o; Merkel cell CA; prostate CA; HTN; pacemaker placement; and OSA not on CPAP presenting with AMS.  He reports that he was nauseated when he got up this AM.  His wife reports that he had an episode in the dining room - vomited in the dining room, hard to get him back to his room, dragging a foot (?), called 911.  He feels fine now.  His wife notes that is confused.  He is "bipolar so he's a little wacko all the time."  He was admitted at [place] in May and he had issues then - medical floor 2 days and psych floor 16-17 and changed some psych meds.  He used to take Tegretol from 1992 until [place], changed to valproid acid.       Risk to Self:no   Risk to Others:no   Prior Inpatient Therapy:yes   Prior Outpatient Therapy:  yes  Past Medical History:  Past Medical History:  Diagnosis Date   Bipolar 1 disorder ([place])    Cancer ([place])    prostate   Glaucoma    Hypertension    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[place])    Mural thrombus of cardiac apex    Presence of permanent cardiac pacemaker 2017   SA node dysfunction   Sleep apnea    does not use CPAP   TIA (transient ischemic attack)     Past Surgical History:  Procedure Laterality Date   CHOLECYSTECTOMY     COLONOSCOPY WITH PROPOFOL N/A 01/31/2017   Procedure: COLONOSCOPY WITH PROPOFOL;  Surgeon: Manya Silvas, MD;  Location: [facility] ENDOSCOPY;  Service: Endoscopy;  Laterality: N/A;   DRUG INDUCED ENDOSCOPY N/A 05/12/2020   Procedure: DRUG INDUCED ENDOSCOPY;  Surgeon: Melida Quitter, MD;  Location: [place];  Service: ENT;  Laterality: N/A;   IMPLANTATION OF HYPOGLOSSAL NERVE STIMULATOR N/A 07/21/2020   Procedure: IMPLANTATION OF HYPOGLOSSAL NERVE STIMULATOR;  Surgeon: Melida Quitter, MD;  Location: [place];  Service: ENT;  Laterality: N/A;   LEG SURGERY Left    distal   Pace maker placement     PROSTATE SURGERY     PROSTATECTOMY     SKIN CANCER EXCISION  2006,2007   Merkle Cell Carcinoma    Family History:  Family History  Problem Relation Age of Onset   Stroke Father    CAD Paternal Grandmother    CAD Paternal Grandfather     Social History:  Social History   Substance and Sexual Activity  Alcohol Use No   Alcohol/week: 0.0 standard drinks     Social History   Substance and Sexual Activity  Drug Use No    Social History   Socioeconomic History   Marital status: Married    Spouse name: Not on file   Number of children: Not on file   Years of education: Not on file   Highest education level: Not on file  Occupational History   Occupation: disabled  Tobacco Use   Smoking status: Never   Smokeless tobacco: Never  Substance and Sexual Activity   Alcohol use: No    Alcohol/week: 0.0 standard drinks   Drug use: No   Sexual activity: Not on file  Other Topics Concern   Not on file  Social History Narrative   Not on file   Social Determinants of Health   Financial Resource Strain: Not on file  Food Insecurity: Not on file  Transportation Needs: Not on file  Physical Activity: Not on file  Stress: Not on file  Social Connections: Not on file   Additional Social History:    Allergies:   Allergies  Allergen Reactions   Other Hives, Shortness Of Breath and Other (See Comments)    Pt states that he is allergic to unwashed blood products.   Other reaction(s): Other (See Comments) UNWASHED BLOOD PRODUCTS  Pt states that he is allergic to unwashed blood products.     Feldene [Piroxicam] Hives   Sulfa Antibiotics Hives    Labs:  Results for orders placed or performed during the hospital encounter of 02/18/21 (from the past 48 hour(s))  Valproic acid level     Status: Abnormal   Collection Time: 02/18/21 11:27 AM  Result Value Ref Range   Valproic Acid Lvl <10 (L) 50.0 - 100.0 ug/mL    Comment: RESULTS CONFIRMED BY MANUAL DILUTION Performed at [facility], [address] [place], [place] [address]   CBC     Status: Abnormal    Collection Time: 02/18/21 11:27 AM  Result Value Ref Range   WBC 8.9 4.0 - 10.5 K/uL   RBC 3.52 (L) 4.22 - 5.81 MIL/uL   Hemoglobin 11.4 (L) 13.0 - 17.0 g/dL   HCT 32.0 (L) 39.0 - 52.0 %   MCV 90.9 80.0 - 100.0 fL   MCH 32.4 26.0 - 34.0 pg   MCHC 35.6 30.0 - 36.0 g/dL   RDW 12.4 11.5 - 15.5 %   Platelets 375 150 - 400 K/uL   nRBC 0.0 0.0 - 0.2 %    Comment: Performed at [facility], [place] [address], [place], [place] [address]  Comprehensive metabolic panel     Status: Abnormal   Collection Time: 02/18/21 11:27 AM  Result Value Ref Range   Sodium 117 (LL) 135 - 145 mmol/L    Comment: CRITICAL RESULT CALLED TO, READ BACK BY AND VERIFIED WITH: ASHLEY STRADER,RN AT 1240 02/18/2021 BY ZBEECH.    Potassium 4.6 3.5 - 5.1 mmol/L   Chloride 82 (L) 98 - 111 mmol/L   CO2 22 22 - 32 mmol/L   Glucose, Bld 124 (H) 70 - 99 mg/dL    Comment: Glucose reference range applies only to samples taken after fasting for at least 8 hours.   BUN 9 8 - 23 mg/dL   Creatinine, Ser 0.71 0.61 - 1.24 mg/dL   Calcium 8.6 (L) 8.9 - 10.3 mg/dL   Total Protein 6.9 6.5 - 8.1 g/dL   Albumin 4.0 3.5 - 5.0 g/dL   AST 59 (H) 15 - 41 U/L   ALT 41 0 - 44 U/L   Alkaline Phosphatase 76 38 - 126 U/L   Total Bilirubin 1.0 0.3 - 1.2 mg/dL   GFR, Estimated >60 >60 mL/min    Comment: (NOTE) Calculated using the CKD-EPI Creatinine Equation (2021)    Anion gap 13 5 - 15    Comment: Performed at [facility], [place] [address] [place],  [address]  Urinalysis, Routine w reflex microscopic Urine, Clean Catch     Status: None   Collection Time: 02/18/21 11:43 AM  Result Value Ref Range   Color, Urine YELLOW YELLOW   APPearance CLEAR CLEAR   Specific Gravity, Urine 1.025 1.005 - 1.030   pH 5.0 5.0 - 8.0   Glucose, UA NEGATIVE NEGATIVE mg/dL   Hgb urine dipstick NEGATIVE NEGATIVE   Bilirubin  Urine NEGATIVE NEGATIVE   Ketones, ur NEGATIVE NEGATIVE mg/dL   Protein, ur NEGATIVE NEGATIVE mg/dL   Nitrite  NEGATIVE NEGATIVE   Leukocytes,Ua NEGATIVE NEGATIVE    Comment: Performed at [facility] [address] [place], [place] [address]  Resp Panel by RT-PCR (Flu A&B, Covid) Nasopharyngeal Swab     Status: None   Collection Time: 02/18/21 12:57 PM   Specimen: Nasopharyngeal Swab; Nasopharyngeal(NP) swabs in vial transport medium  Result Value Ref Range   SARS Coronavirus 2 by RT PCR NEGATIVE NEGATIVE    Comment: (NOTE) SARS-CoV-2 target nucleic acids are NOT DETECTED.  The SARS-CoV-2 RNA is generally detectable in upper respiratory specimens during the acute phase of infection. The lowest concentration of SARS-CoV-2 viral copies this assay can detect is 138 copies/mL. A negative result does not preclude SARS-Cov-2 infection and should not be used as the sole basis for treatment or other patient management decisions. A negative result may occur with  improper specimen collection/handling, submission of specimen other than nasopharyngeal swab, presence of viral mutation(s) within the areas targeted by this assay, and inadequate number of viral copies(<138 copies/mL). A negative result must be combined with clinical observations, patient history, and epidemiological information. The expected result is Negative.  Fact Sheet for Patients:  EntrepreneurPulse.com.au  Fact Sheet for Healthcare Providers:  IncredibleEmployment.be  This test is no t yet approved or cleared by the [place] FDA and  has been authorized for detection and/or diagnosis of SARS-CoV-2 by FDA under an Emergency Use Authorization (EUA). This EUA will remain  in effect (meaning this test can be used) for the duration of the COVID-19 declaration under Section 564(b)(1) of the Act, 21 U.S.C.section 360bbb-3(b)(1), unless the authorization is terminated  or revoked sooner.       Influenza A by PCR NEGATIVE NEGATIVE   Influenza B by PCR NEGATIVE NEGATIVE    Comment:  (NOTE) The Xpert Xpress SARS-CoV-2/FLU/RSV plus assay is intended as an aid in the diagnosis of influenza from Nasopharyngeal swab specimens and should not be used as a sole basis for treatment. Nasal washings and aspirates are unacceptable for Xpert Xpress SARS-CoV-2/FLU/RSV testing.  Fact Sheet for Patients: EntrepreneurPulse.com.au  Fact Sheet for Healthcare Providers: IncredibleEmployment.be  This test is not yet approved or cleared by the [place] FDA and has been authorized for detection and/or diagnosis of SARS-CoV-2 by FDA under an Emergency Use Authorization (EUA). This EUA will remain in effect (meaning this test can be used) for the duration of the COVID-19 declaration under Section 564(b)(1) of the Act, 21 U.S.C. section 360bbb-3(b)(1), unless the authorization is terminated or revoked.  Performed at [facility], [place] [address], [place], [place] [address]   Basic metabolic panel     Status: Abnormal   Collection Time: 02/18/21  4:21 PM  Result Value Ref Range   Sodium 119 (LL) 135 - 145 mmol/L    Comment: CRITICAL RESULT CALLED TO, READ BACK BY AND VERIFIED WITH:  Leron Croak,  RN, [place], 02/18/21, E. ADEDOKUN.    Potassium 4.0 3.5 - 5.1 mmol/L   Chloride 88 (L) 98 - 111 mmol/L   CO2 19 (L) 22 - 32 mmol/L   Glucose, Bld 114 (H) 70 - 99 mg/dL    Comment: Glucose reference range applies only to samples taken after fasting for at least 8 hours.   BUN 7 (L) 8 - 23 mg/dL   Creatinine, Ser 0.60 (L) 0.61 - 1.24 mg/dL   Calcium 8.0 (  L) 8.9 - 10.3 mg/dL   GFR, Estimated >60 >60 mL/min    Comment: (NOTE) Calculated using the CKD-EPI Creatinine Equation (2021)    Anion gap 12 5 - 15    Comment: Performed at [facility], [place] [address], [place], [place] [address]  Osmolality, urine     Status: Abnormal   Collection Time: 02/18/21  8:58 PM  Result Value Ref Range   Osmolality, Ur 285 (L) 300 - 900 mOsm/kg    Comment:  Performed at [facility] [address], [place], [place] [address]  Na and K (sodium & potassium), rand urine     Status: None   Collection Time: 02/18/21  8:59 PM  Result Value Ref Range   Sodium, Ur 70 mmol/L   Potassium Urine 35 mmol/L    Comment: Performed at [facility] [address] [place], [place] [address]  Basic metabolic panel     Status: Abnormal   Collection Time: 02/18/21  9:20 PM  Result Value Ref Range   Sodium 123 (L) 135 - 145 mmol/L   Potassium 4.1 3.5 - 5.1 mmol/L   Chloride 91 (L) 98 - 111 mmol/L   CO2 23 22 - 32 mmol/L   Glucose, Bld 112 (H) 70 - 99 mg/dL    Comment: Glucose reference range applies only to samples taken after fasting for at least 8 hours.   BUN 7 (L) 8 - 23 mg/dL   Creatinine, Ser 0.67 0.61 - 1.24 mg/dL   Calcium 8.4 (L) 8.9 - 10.3 mg/dL   GFR, Estimated >60 >60 mL/min    Comment: (NOTE) Calculated using the CKD-EPI Creatinine Equation (2021)    Anion gap 9 5 - 15    Comment: Performed at [facility] [address], [place], [place] [address]  Osmolality     Status: Abnormal   Collection Time: 02/18/21  9:20 PM  Result Value Ref Range   Osmolality 255 (L) 275 - 295 mOsm/kg    Comment: Performed at [facility], [place] [address] [place], [place] [address]  MRSA Next Gen by PCR, Nasal     Status: None   Collection Time: 02/18/21 11:40 PM   Specimen: Nasal Mucosa; Nasal Swab  Result Value Ref Range   MRSA by PCR Next Gen NOT DETECTED NOT DETECTED    Comment: (NOTE) The GeneXpert MRSA Assay (FDA approved for NASAL specimens only), is one component of a comprehensive MRSA colonization surveillance program. It is not intended to diagnose MRSA infection nor to guide or monitor treatment for MRSA infections. Test performance is not FDA approved in patients less than 45 years old. Performed at [facility], [place] [address], [place], [place] [address]   Basic metabolic panel     Status: Abnormal    Collection Time: 02/19/21  2:46 AM  Result Value Ref Range   Sodium 123 (L) 135 - 145 mmol/L   Potassium 4.4 3.5 - 5.1 mmol/L   Chloride 92 (L) 98 - 111 mmol/L   CO2 20 (L) 22 - 32 mmol/L   Glucose, Bld 84 70 - 99 mg/dL    Comment: Glucose reference range applies only to samples taken after fasting for at least 8 hours.   BUN 9 8 - 23 mg/dL   Creatinine, Ser 0.70 0.61 - 1.24 mg/dL   Calcium 8.2 (L) 8.9 - 10.3 mg/dL   GFR, Estimated >60 >60 mL/min    Comment: (NOTE) Calculated  using the CKD-EPI Creatinine Equation (2021)    Anion gap 11 5 - 15    Comment: Performed at [facility], [place] [address] [place], [place] [address]  CBC     Status: Abnormal   Collection Time: 02/19/21  2:46 AM  Result Value Ref Range   WBC 8.4 4.0 - 10.5 K/uL   RBC 3.18 (L) 4.22 - 5.81 MIL/uL   Hemoglobin 10.5 (L) 13.0 - 17.0 g/dL   HCT 28.7 (L) 39.0 - 52.0 %   MCV 90.3 80.0 - 100.0 fL   MCH 33.0 26.0 - 34.0 pg   MCHC 36.6 (H) 30.0 - 36.0 g/dL   RDW 12.7 11.5 - 15.5 %   Platelets 341 150 - 400 K/uL   nRBC 0.0 0.0 - 0.2 %    Comment: Performed at [facility], [place] [address] [place], [place] [address]  Basic metabolic panel     Status: Abnormal   Collection Time: 02/19/21  7:43 AM  Result Value Ref Range   Sodium 124 (L) 135 - 145 mmol/L   Potassium 4.1 3.5 - 5.1 mmol/L   Chloride 94 (L) 98 - 111 mmol/L   CO2 23 22 - 32 mmol/L   Glucose, Bld 126 (H) 70 - 99 mg/dL    Comment: Glucose reference range applies only to samples taken after fasting for at least 8 hours.   BUN 9 8 - 23 mg/dL   Creatinine, Ser 0.64 0.61 - 1.24 mg/dL   Calcium 8.1 (L) 8.9 - 10.3 mg/dL   GFR, Estimated >60 >60 mL/min    Comment: (NOTE) Calculated using the CKD-EPI Creatinine Equation (2021)    Anion gap 7 5 - 15    Comment: Performed at [facility] [address] [place], [place] [address]    Current Facility-Administered Medications  Medication Dose Route Frequency Provider Last Rate Last Admin    acetaminophen (TYLENOL) tablet 650 mg  650 mg Oral Q6H PRN Karmen Bongo, MD       Or   acetaminophen (TYLENOL) suppository 650 mg  650 mg Rectal Q6H PRN Karmen Bongo, MD       albuterol (PROVENTIL) (2.5 MG/3ML) 0.083% nebulizer solution 2.5 mg  2.5 mg Nebulization Q2H PRN Karmen Bongo, MD       amLODipine (NORVASC) tablet 10 mg  10 mg Oral Daily Karmen Bongo, MD   10 mg at 02/19/21 4315   aspirin EC tablet 81 mg  81 mg Oral Daily Karmen Bongo, MD   81 mg at 02/19/21 4008   atorvastatin (LIPITOR) tablet 40 mg  40 mg Oral 1 day or 1 dose Karmen Bongo, MD   40 mg at 02/18/21 1751   bisacodyl (DULCOLAX) EC tablet 5 mg  5 mg Oral Daily PRN Karmen Bongo, MD       carvedilol (COREG) tablet 25 mg  25 mg Oral BID WC Karmen Bongo, MD   25 mg at 02/19/21 6761   cloNIDine (CATAPRES) tablet 0.1 mg  0.1 mg Oral BID Karmen Bongo, MD   0.1 mg at 02/19/21 9509   docusate sodium (COLACE) capsule 100 mg  100 mg Oral BID Karmen Bongo, MD   100 mg at 02/19/21 0926   enoxaparin (LOVENOX) injection 40 mg  40 mg Subcutaneous Q24H Karmen Bongo, MD       gabapentin (NEURONTIN) capsule 300 mg  300 mg Oral BID Karmen Bongo, MD   300 mg at 02/19/21 3267   hydrALAZINE (APRESOLINE) injection  5 mg  5 mg Intravenous Q4H PRN Karmen Bongo, MD       HYDROcodone-acetaminophen (NORCO/VICODIN) 5-325 MG per tablet 1-2 tablet  1-2 tablet Oral Q4H PRN Karmen Bongo, MD       hydrOXYzine (ATARAX/VISTARIL) tablet 100 mg  100 mg Oral Ivery Quale, MD   100 mg at 02/18/21 2106   latanoprost (XALATAN) 0.005 % ophthalmic solution 1 drop  1 drop Right Eye BID Karmen Bongo, MD   1 drop at 02/19/21 4332   morphine 2 MG/ML injection 2 mg  2 mg Intravenous Q2H PRN Karmen Bongo, MD       ondansetron [facility]) tablet 4 mg  4 mg Oral Q6H PRN Karmen Bongo, MD       Or   ondansetron [facility]) injection 4 mg  4 mg Intravenous Q6H PRN Karmen Bongo, MD       pantoprazole (PROTONIX) EC tablet 40  mg  40 mg Oral BID Karmen Bongo, MD   40 mg at 02/19/21 9518   polyethylene glycol (MIRALAX / GLYCOLAX) packet 17 g  17 g Oral Daily PRN Karmen Bongo, MD       QUEtiapine (SEROQUEL) tablet 300 mg  300 mg Oral Ivery Quale, MD   300 mg at 02/18/21 2107   sertraline (ZOLOFT) tablet 25 mg  25 mg Oral Ivery Quale, MD   25 mg at 02/18/21 2106   sodium chloride flush (NS) 0.9 % injection 3 mL  3 mL Intravenous Lillia [place], MD   3 mL at 02/19/21 8416   valproic acid (DEPAKENE) 250 MG/5ML solution 750 mg  750 mg Oral Ivery Quale, MD   750 mg at 02/18/21 2108    Musculoskeletal: Strength & Muscle Tone:  did not assess; patient laying in bed Gait & Station:  did not assess; patient laying in bed Patient leans:  did not assess; patient laying in bed            Psychiatric Specialty Exam:  Presentation  General Appearance: Appropriate for Environment; Casual Eye Contact:Fair Speech:Clear and Coherent; Normal Rate Speech Volume:Normal Handedness:No data recorded  Mood and Affect  Mood:Euthymic Affect:Appropriate; Congruent  Thought Process  Thought Processes:Coherent; Goal Directed; Linear Descriptions of Associations:Intact Orientation:Full (Time, Place and Person) Thought Content:Logical; WDL History of Schizophrenia/Schizoaffective disorder:No data recorded Duration of Psychotic Symptoms:No data recorded Hallucinations:Hallucinations: None Ideas of Reference:None Suicidal Thoughts:Suicidal Thoughts: No Homicidal Thoughts:Homicidal Thoughts: No  Sensorium  Memory:Immediate Good; Remote Fair; Recent Fair Judgment:Intact Insight:Present  Executive Functions  Concentration:Fair Attention Span:Fair [place]  Psychomotor Activity  Psychomotor Activity:Psychomotor Activity: Normal  Assets  Assets:Communication Skills; Desire for Improvement; Physical Health; Resilience; Social Support  Sleep   Sleep:Sleep: Fair  Physical Exam: Physical Exam Constitutional:      Appearance: Normal appearance.  HENT:     Head: Normocephalic and atraumatic.  Pulmonary:     Effort: Pulmonary effort is normal.  Neurological:     Mental Status: He is alert.   Review of Systems  Psychiatric/Behavioral:  Negative for depression, hallucinations, substance abuse and suicidal ideas. The patient is not nervous/anxious.   Blood pressure (!) 93/46, pulse 93, temperature 97.6 F (36.4 C), temperature source Oral, resp. rate (!) 21, height 6' (1.829 m), weight 99.8 kg, SpO2 93 %. Body mass index is 29.84 kg/m.  Treatment Plan Summary: Douglas Perkins. is a 75 y.o. male with history of bipolar disorder, prostate cancer, HTN, TIA who presented with AMS from his ALF  on 10/21. Patient reported that he had vomited to EMS prior to their arrival. Labs revealed sodium of 117 and was admitted to the hospitalist service for further care. Psychiatry was consulted for medication management as psychiatric medications may have contributed/caused  SIADH.  Patient on multiple psychiatric medications -valproic acid 750 mg qhs, zoloft 25 mg, and seroquel 300 mg and gabapentin 600 mg bID. Based on chart review, patient was recently started on zoloft on 02/08/21 for compulsive behavior.  By history, it appears that patient has a h/o developing hyponatremia with psychiatric medications.  Zoloft is an SSRI and SSRIs are identified in the [place] Criteria as potentially inappropriate medications to be used with caution in patients 65 years and older due to the potential to cause or exacerbate syndrome of inappropriate antidiuretic hormone secretion (SIADH) or hyponatremia. Suspect that initiation of this medication may have contributed to patient developing hyponatremia as this medication was started ~11 days ago.   Patient reports medication compliance with VPA; however, level on admission was <10 suggesting otherwise. VPA,  gabapentin and zoloft all have had some associated with hyponatremia-suspect zoloft is the largest offender in this case.   Sodium has improved from 117-124.  Recommendations -Stop zoloft 25 mg as there is high suspicion it may have contributed to hyponatremia -continue seroquel 300 mg qhs for mood -hold valproic acid as it does not appear that patient is taking (VPA<10 on admission)  -hold gabapentin 600 mg BID for now; can consider restarting once sodium normalizes.  Psychiatry to follow up tomorrow Communicated recommendatiosn to Dr. Sloan Leiter via epic secure chat  Disposition: No evidence of imminent risk to self or others at present.   Patient does not meet criteria for psychiatric inpatient admission.  Ival Bible, MD Attending psychiatrist 02/19/2021 3:40 PM

## 2021-02-20 DIAGNOSIS — G9341 Metabolic encephalopathy: Secondary | ICD-10-CM | POA: Diagnosis not present

## 2021-02-20 LAB — TSH: TSH: 0.432 u[IU]/mL (ref 0.350–4.500)

## 2021-02-20 LAB — URIC ACID: Uric Acid, Serum: 3.8 mg/dL (ref 3.7–8.6)

## 2021-02-20 LAB — BASIC METABOLIC PANEL
Anion gap: 7 (ref 5–15)
BUN: 10 mg/dL (ref 8–23)
CO2: 23 mmol/L (ref 22–32)
Calcium: 8.3 mg/dL — ABNORMAL LOW (ref 8.9–10.3)
Chloride: 98 mmol/L (ref 98–111)
Creatinine, Ser: 0.73 mg/dL (ref 0.61–1.24)
GFR, Estimated: >60 mL/min (ref >60)
Glucose, Bld: 91 mg/dL (ref 70–99)
Potassium: 4.2 mmol/L (ref 3.5–5.1)
Sodium: 128 mmol/L — ABNORMAL LOW (ref 135–145)

## 2021-02-20 MED ORDER — ATORVASTATIN CALCIUM 40 MG PO TABS
40.0000 mg | ORAL_TABLET | Freq: Every day | ORAL | Status: DC
  Administered 2021-02-20 – 2021-02-22 (×3): 40 mg via ORAL
  Filled 2021-02-20 (×3): qty 1

## 2021-02-20 MED ORDER — FUROSEMIDE 10 MG/ML IJ SOLN
20.0000 mg | Freq: Once | INTRAMUSCULAR | Status: AC
  Administered 2021-02-20: 20 mg via INTRAVENOUS
  Filled 2021-02-20: qty 2

## 2021-02-20 MED ORDER — CARVEDILOL 12.5 MG PO TABS
12.5000 mg | ORAL_TABLET | Freq: Two times a day (BID) | ORAL | Status: DC
  Administered 2021-02-20 – 2021-02-22 (×4): 12.5 mg via ORAL
  Filled 2021-02-20 (×4): qty 1

## 2021-02-20 NOTE — Plan of Care (Signed)
  Problem: Health Behavior/Discharge Planning: Goal: Ability to manage health-related needs will improve Outcome: Progressing   Problem: Clinical Measurements: Goal: Ability to maintain clinical measurements within normal limits will improve Outcome: Progressing   Problem: Activity: Goal: Risk for activity intolerance will decrease Outcome: Progressing   

## 2021-02-20 NOTE — Progress Notes (Signed)
Assessment unchanged, pt is spitting on floor, educated pt on use of bedside container with tissues for spitting, educated pt on amount of fluid intake, pt verbalized understanding of both areas, call bell within reach, bed in lowest and locked position.

## 2021-02-20 NOTE — Consult Note (Signed)
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  Please evaluate for adjustment of psych medications likely causing SIADH. Referring Physician:  Dr. Sloan Leiter Patient Identification: Douglas Perkins. MRN:  [id number] Principal Diagnosis: Acute metabolic encephalopathy Diagnosis:  Principal Problem:   Acute metabolic encephalopathy Active Problems:   Benign essential hypertension   Hyperlipidemia, mixed   Bipolar I disorder (HCC)   Cardiac pacemaker   OSA (obstructive sleep apnea)   Hyponatremia syndrome   Pressure injury of skin   Total Time spent with patient: 30 minutes  Subjective:   Douglas Perkins. is a 75 y.o. male with history of bipolar disorder, prostate cancer, HTN, TIA who presented with AMS from his ALF on 10/21. Patient reported that he had vomited to EMS prior to their arrival. Labs revealed sodium of 117 and was admitted to the hospitalist service for further care. Psychiatry was consulted for medication management as psychiatric medications may have contributed/caused  SIADH. Gabapentin, Valproic acid, zoloft stopped yesterday and continued seroquel 300 mg.   Patient is AAO x 4 and is calm, cooperative and pleasant. He is found sleeping in a chair this AM. Discussed medication changes and he is amenable. He denies SI/HI/AVH and describes his mood as "good" and he reports sleeping well. His biggest concern this morning is that he is hungry and is requesting breakfast.  On chart review, Na improved today to 128.   HPI per primary team: Douglas Perkins. is a 75 y.o. male with medical history significant of bipolar d/o; Merkel cell CA; prostate CA; HTN; pacemaker placement; and OSA not on CPAP presenting with AMS.  He reports that he was nauseated when he got up this AM.  His wife reports that he had an episode in the dining room - vomited in the dining  room, hard to get him back to his room, dragging a foot (?), called 911.  He feels fine now.  His wife notes that is confused.  He is "bipolar so he's a little wacko all the time."  He was admitted at [place] in May and he had issues then - medical floor 2 days and psych floor 16-17 and changed some psych meds.  He used to take Tegretol from 1992 until [place], changed to valproic acid.       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    Presence of permanent cardiac pacemaker 2017   SA node dysfunction   Sleep apnea    does not use CPAP   TIA (transient ischemic attack)     Past Surgical History:  Procedure Laterality Date   CHOLECYSTECTOMY     COLONOSCOPY WITH PROPOFOL N/A 01/31/2017   Procedure: COLONOSCOPY WITH PROPOFOL;  Surgeon: Manya Silvas, MD;  Location: [facility] ENDOSCOPY;  Service: Endoscopy;  Laterality: N/A;   DRUG INDUCED ENDOSCOPY N/A 05/12/2020   Procedure: DRUG INDUCED ENDOSCOPY;  Surgeon: Melida Quitter, MD;  Location: [facility];  Service: ENT;  Laterality: N/A;   IMPLANTATION OF HYPOGLOSSAL NERVE STIMULATOR N/A 07/21/2020   Procedure: IMPLANTATION  OF HYPOGLOSSAL NERVE STIMULATOR;  Surgeon: Melida Quitter, MD;  Location: [place] [facility];  Service: ENT;  Laterality: N/A;   LEG SURGERY Left    distal   Pace maker placement     PROSTATE SURGERY     PROSTATECTOMY     SKIN CANCER EXCISION  2006,2007   Merkle Cell Carcinoma   Family History:  Family History  Problem Relation Age of Onset   Stroke Father    CAD Paternal Grandmother    CAD Paternal Grandfather     Social History:  Social History   Substance and Sexual Activity  Alcohol Use No   Alcohol/week: 0.0 standard drinks      Social History   Substance and Sexual Activity  Drug Use No    Social History   Socioeconomic History   Marital status: Married    Spouse name: Not on file   Number of children: Not on file   Years of education: Not on file   Highest education level: Not on file  Occupational History   Occupation: disabled  Tobacco Use   Smoking status: Never   Smokeless tobacco: Never  Substance and Sexual Activity   Alcohol use: No    Alcohol/week: 0.0 standard drinks   Drug use: No   Sexual activity: Not on file  Other Topics Concern   Not on file  Social History Narrative   Not on file   Social Determinants of Health   Financial Resource Strain: Not on file  Food Insecurity: Not on file  Transportation Needs: Not on file  Physical Activity: Not on file  Stress: Not on file  Social Connections: Not on file   Additional Social History:    Allergies:   Allergies  Allergen Reactions   Other Hives, Shortness Of Breath and Other (See Comments)    Pt states that he is allergic to unwashed blood products.   Other reaction(s): Other (See Comments) UNWASHED BLOOD PRODUCTS  Pt states that he is allergic to unwashed blood products.     Feldene [Piroxicam] Hives   Sulfa Antibiotics Hives    Labs:  Results for orders placed or performed during the hospital encounter of 02/18/21 (from the past 48 hour(s))  Resp Panel by RT-PCR (Flu A&B, Covid) Nasopharyngeal Swab     Status: None   Collection Time: 02/18/21 12:57 PM   Specimen: Nasopharyngeal Swab; Nasopharyngeal(NP) swabs in vial transport medium  Result Value Ref Range   SARS Coronavirus 2 by RT PCR NEGATIVE NEGATIVE    Comment: (NOTE) SARS-CoV-2 target nucleic acids are NOT DETECTED.  The SARS-CoV-2 RNA is generally detectable in upper respiratory specimens during the acute phase of infection. The lowest concentration of SARS-CoV-2 viral copies this assay can detect is 138 copies/mL. A negative result does not preclude  SARS-Cov-2 infection and should not be used as the sole basis for treatment or other patient management decisions. A negative result may occur with  improper specimen collection/handling, submission of specimen other than nasopharyngeal swab, presence of viral mutation(s) within the areas targeted by this assay, and inadequate number of viral copies(<138 copies/mL). A negative result must be combined with clinical observations, patient history, and epidemiological information. The expected result is Negative.  Fact Sheet for Patients:  EntrepreneurPulse.com.au  Fact Sheet for Healthcare Providers:  IncredibleEmployment.be  This test is no t yet approved or cleared by the [place] FDA and  has been authorized for detection and/or diagnosis of SARS-CoV-2 by FDA under an Emergency Use Authorization (  EUA). This EUA will remain  in effect (meaning this test can be used) for the duration of the COVID-19 declaration under Section 564(b)(1) of the Act, 21 U.S.C.section 360bbb-3(b)(1), unless the authorization is terminated  or revoked sooner.       Influenza A by PCR NEGATIVE NEGATIVE   Influenza B by PCR NEGATIVE NEGATIVE    Comment: (NOTE) The Xpert Xpress SARS-CoV-2/FLU/RSV plus assay is intended as an aid in the diagnosis of influenza from Nasopharyngeal swab specimens and should not be used as a sole basis for treatment. Nasal washings and aspirates are unacceptable for Xpert Xpress SARS-CoV-2/FLU/RSV testing.  Fact Sheet for Patients: EntrepreneurPulse.com.au  Fact Sheet for Healthcare Providers: IncredibleEmployment.be  This test is not yet approved or cleared by the [place] FDA and has been authorized for detection and/or diagnosis of SARS-CoV-2 by FDA under an Emergency Use Authorization (EUA). This EUA will remain in effect (meaning this test can be used) for the duration of the COVID-19  declaration under Section 564(b)(1) of the Act, 21 U.S.C. section 360bbb-3(b)(1), unless the authorization is terminated or revoked.  Performed at [facility], [place] [address], [place], [place] [address]   Basic metabolic panel     Status: Abnormal   Collection Time: 02/18/21  4:21 PM  Result Value Ref Range   Sodium 119 (LL) 135 - 145 mmol/L    Comment: CRITICAL RESULT CALLED TO, READ BACK BY AND VERIFIED WITH:  Leron Croak,  RN, [place], 02/18/21, E. ADEDOKUN.    Potassium 4.0 3.5 - 5.1 mmol/L   Chloride 88 (L) 98 - 111 mmol/L   CO2 19 (L) 22 - 32 mmol/L   Glucose, Bld 114 (H) 70 - 99 mg/dL    Comment: Glucose reference range applies only to samples taken after fasting for at least 8 hours.   BUN 7 (L) 8 - 23 mg/dL   Creatinine, Ser 0.60 (L) 0.61 - 1.24 mg/dL   Calcium 8.0 (L) 8.9 - 10.3 mg/dL   GFR, Estimated >60 >60 mL/min    Comment: (NOTE) Calculated using the CKD-EPI Creatinine Equation (2021)    Anion gap 12 5 - 15    Comment: Performed at [facility] [address], [place], [place] [address]  Osmolality, urine     Status: Abnormal   Collection Time: 02/18/21  8:58 PM  Result Value Ref Range   Osmolality, Ur 285 (L) 300 - 900 mOsm/kg    Comment: Performed at [facility] [address] [place], [place] [address]  Na and K (sodium & potassium), rand urine     Status: None   Collection Time: 02/18/21  8:59 PM  Result Value Ref Range   Sodium, Ur 70 mmol/L   Potassium Urine 35 mmol/L    Comment: Performed at [facility] [address], [place], [place] [address]  Basic metabolic panel     Status: Abnormal   Collection Time: 02/18/21  9:20 PM  Result Value Ref Range   Sodium 123 (L) 135 - 145 mmol/L   Potassium 4.1 3.5 - 5.1 mmol/L   Chloride 91 (L) 98 - 111 mmol/L   CO2 23 22 - 32 mmol/L   Glucose, Bld 112 (H) 70 - 99 mg/dL    Comment: Glucose reference range applies only to samples taken after fasting for at least 8 hours.   BUN 7 (L) 8  - 23 mg/dL   Creatinine, Ser 0.67 0.61 - 1.24 mg/dL  Calcium 8.4 (L) 8.9 - 10.3 mg/dL   GFR, Estimated >60 >60 mL/min    Comment: (NOTE) Calculated using the CKD-EPI Creatinine Equation (2021)    Anion gap 9 5 - 15    Comment: Performed at [facility], [place] [address], [place], [place] [address]  Osmolality     Status: Abnormal   Collection Time: 02/18/21  9:20 PM  Result Value Ref Range   Osmolality 255 (L) 275 - 295 mOsm/kg    Comment: Performed at [facility], [place] [address], [place], [place] [address]  MRSA Next Gen by PCR, Nasal     Status: None   Collection Time: 02/18/21 11:40 PM   Specimen: Nasal Mucosa; Nasal Swab  Result Value Ref Range   MRSA by PCR Next Gen NOT DETECTED NOT DETECTED    Comment: (NOTE) The GeneXpert MRSA Assay (FDA approved for NASAL specimens only), is one component of a comprehensive MRSA colonization surveillance program. It is not intended to diagnose MRSA infection nor to guide or monitor treatment for MRSA infections. Test performance is not FDA approved in patients less than 70 years old. Performed at [facility], [place] [address] [place], [place] [address]   Basic metabolic panel     Status: Abnormal   Collection Time: 02/19/21  2:46 AM  Result Value Ref Range   Sodium 123 (L) 135 - 145 mmol/L   Potassium 4.4 3.5 - 5.1 mmol/L   Chloride 92 (L) 98 - 111 mmol/L   CO2 20 (L) 22 - 32 mmol/L   Glucose, Bld 84 70 - 99 mg/dL    Comment: Glucose reference range applies only to samples taken after fasting for at least 8 hours.   BUN 9 8 - 23 mg/dL   Creatinine, Ser 0.70 0.61 - 1.24 mg/dL   Calcium 8.2 (L) 8.9 - 10.3 mg/dL   GFR, Estimated >60 >60 mL/min    Comment: (NOTE) Calculated using the CKD-EPI Creatinine Equation (2021)    Anion gap 11 5 - 15    Comment: Performed at [facility] [address] [place], [place] [address]  CBC     Status: Abnormal   Collection Time: 02/19/21  2:46 AM  Result Value Ref Range    WBC 8.4 4.0 - 10.5 K/uL   RBC 3.18 (L) 4.22 - 5.81 MIL/uL   Hemoglobin 10.5 (L) 13.0 - 17.0 g/dL   HCT 28.7 (L) 39.0 - 52.0 %   MCV 90.3 80.0 - 100.0 fL   MCH 33.0 26.0 - 34.0 pg   MCHC 36.6 (H) 30.0 - 36.0 g/dL   RDW 12.7 11.5 - 15.5 %   Platelets 341 150 - 400 K/uL   nRBC 0.0 0.0 - 0.2 %    Comment: Performed at [facility], [place] [address] [place], [place] [address]  Basic metabolic panel     Status: Abnormal   Collection Time: 02/19/21  7:43 AM  Result Value Ref Range   Sodium 124 (L) 135 - 145 mmol/L   Potassium 4.1 3.5 - 5.1 mmol/L   Chloride 94 (L) 98 - 111 mmol/L   CO2 23 22 - 32 mmol/L   Glucose, Bld 126 (H) 70 - 99 mg/dL    Comment: Glucose reference range applies only to samples taken after fasting for at least 8 hours.   BUN 9 8 - 23 mg/dL   Creatinine, Ser 0.64 0.61 - 1.24 mg/dL   Calcium 8.1 (L) 8.9 -  10.3 mg/dL   GFR, Estimated >60 >60 mL/min    Comment: (NOTE) Calculated using the CKD-EPI Creatinine Equation (2021)    Anion gap 7 5 - 15    Comment: Performed at [facility], [place] [address], [place], [place] [address]  Basic metabolic panel     Status: Abnormal   Collection Time: 02/20/21 12:52 AM  Result Value Ref Range   Sodium 128 (L) 135 - 145 mmol/L   Potassium 4.2 3.5 - 5.1 mmol/L   Chloride 98 98 - 111 mmol/L   CO2 23 22 - 32 mmol/L   Glucose, Bld 91 70 - 99 mg/dL    Comment: Glucose reference range applies only to samples taken after fasting for at least 8 hours.   BUN 10 8 - 23 mg/dL   Creatinine, Ser 0.73 0.61 - 1.24 mg/dL   Calcium 8.3 (L) 8.9 - 10.3 mg/dL   GFR, Estimated >60 >60 mL/min    Comment: (NOTE) Calculated using the CKD-EPI Creatinine Equation (2021)    Anion gap 7 5 - 15    Comment: Performed at [facility] [address] [place], [place] [address]  TSH     Status: None   Collection Time: 02/20/21 12:52 AM  Result Value Ref Range   TSH 0.432 0.350 - 4.500 uIU/mL    Comment: Performed by a 3rd Generation  assay with a functional sensitivity of <=0.01 uIU/mL. Performed at [facility], [place] [address] [place], [place] [address]   Uric acid     Status: None   Collection Time: 02/20/21 12:52 AM  Result Value Ref Range   Uric Acid, Serum 3.8 3.7 - 8.6 mg/dL    Comment: Performed at [facility] [address], [place], [place] [address]    Current Facility-Administered Medications  Medication Dose Route Frequency Provider Last Rate Last Admin   acetaminophen (TYLENOL) tablet 650 mg  650 mg Oral Q6H PRN Karmen Bongo, MD       Or   acetaminophen (TYLENOL) suppository 650 mg  650 mg Rectal Q6H PRN Karmen Bongo, MD       albuterol (PROVENTIL) (2.5 MG/3ML) 0.083% nebulizer solution 2.5 mg  2.5 mg Nebulization Q2H PRN Karmen Bongo, MD       amLODipine (NORVASC) tablet 10 mg  10 mg Oral Daily Karmen Bongo, MD   10 mg at 02/20/21 0830   aspirin EC tablet 81 mg  81 mg Oral Daily Karmen Bongo, MD   81 mg at 02/20/21 0830   atorvastatin (LIPITOR) tablet 40 mg  40 mg Oral 1 day or 1 dose Karmen Bongo, MD   40 mg at 02/19/21 1614   bisacodyl (DULCOLAX) EC tablet 5 mg  5 mg Oral Daily PRN Karmen Bongo, MD       carvedilol (COREG) tablet 12.5 mg  12.5 mg Oral BID WC Thurnell Lose, MD       docusate sodium (COLACE) capsule 100 mg  100 mg Oral BID Karmen Bongo, MD   100 mg at 02/20/21 0830   enoxaparin (LOVENOX) injection 40 mg  40 mg Subcutaneous Q24H Karmen Bongo, MD   40 mg at 02/19/21 1615   hydrALAZINE (APRESOLINE) injection 5 mg  5 mg Intravenous Q4H PRN Karmen Bongo, MD       HYDROcodone-acetaminophen (NORCO/VICODIN) 5-325 MG per tablet 1-2 tablet  1-2 tablet Oral Q4H PRN Karmen Bongo, MD       hydrOXYzine (ATARAX/VISTARIL) tablet 100 mg  100 mg Oral Ivery Quale, MD   100 mg at 02/19/21 2106   latanoprost (XALATAN) 0.005 % ophthalmic solution 1 drop  1 drop Right Eye BID Karmen Bongo, MD   1 drop at 02/20/21 0830   ondansetron (ZOFRAN) tablet 4  mg  4 mg Oral Q6H PRN Karmen Bongo, MD       Or   ondansetron [facility]) injection 4 mg  4 mg Intravenous Q6H PRN Karmen Bongo, MD       pantoprazole (PROTONIX) EC tablet 40 mg  40 mg Oral BID Karmen Bongo, MD   40 mg at 02/20/21 0830   polyethylene glycol (MIRALAX / GLYCOLAX) packet 17 g  17 g Oral Daily PRN Karmen Bongo, MD       QUEtiapine (SEROQUEL) tablet 300 mg  300 mg Oral Ivery Quale, MD   300 mg at 02/19/21 2105   sodium chloride flush (NS) 0.9 % injection 3 mL  3 mL Intravenous Lillia Mountain, MD   3 mL at 02/20/21 0831    Musculoskeletal: Strength & Muscle Tone:  did not assess; patient laying in bed Van:  did not assess; patient laying in bed Patient leans:  did not assess; patient laying in bed            Psychiatric Specialty Exam:  Presentation  General Appearance: Appropriate for Environment; Casual Eye Contact:Fair Speech:Clear and Coherent; Normal Rate Speech Volume:Normal Handedness:No data recorded  Mood and Affect  Mood:Euthymic Affect:Appropriate; Congruent  Thought Process  Thought Processes:Coherent; Goal Directed; Linear Descriptions of Associations:Intact Orientation:Full (Time, Place and Person) Thought Content:Logical; WDL History of Schizophrenia/Schizoaffective disorder:No data recorded Duration of Psychotic Symptoms:No data recorded Hallucinations:Hallucinations: None Ideas of Reference:None Suicidal Thoughts:Suicidal Thoughts: No Homicidal Thoughts:Homicidal Thoughts: No  Sensorium  Memory:Immediate Good; Remote Fair; Recent Fair Judgment:Intact Insight:Present  Executive Functions  Concentration:Fair Attention Span:Fair [place]  Psychomotor Activity  Psychomotor Activity:Psychomotor Activity: Normal  Assets  Assets:Communication Skills; Desire for Improvement; Resilience  Sleep  Sleep:Sleep: Fair  Physical Exam: Physical  Exam Constitutional:      Appearance: Normal appearance.  HENT:     Head: Normocephalic and atraumatic.  Pulmonary:     Effort: Pulmonary effort is normal.  Neurological:     Mental Status: He is alert.   Review of Systems  Psychiatric/Behavioral:  Negative for depression, hallucinations, substance abuse and suicidal ideas. The patient is not nervous/anxious.   Blood pressure 137/70, pulse 87, temperature 97.7 F (36.5 C), temperature source Oral, resp. rate (!) 21, height 6' (1.829 m), weight 99.8 kg, SpO2 94 %. Body mass index is 29.84 kg/m.  Treatment Plan Summary: Douglas Perkins. is a 76 y.o. male with history of bipolar disorder, prostate cancer, HTN, TIA who presented with AMS from his ALF on 10/21. Patient reported that he had vomited to EMS prior to their arrival. Labs revealed sodium of 117 and was admitted to the hospitalist service for further care. Psychiatry was consulted for medication management as psychiatric medications may have contributed/caused  SIADH.  Patient on multiple psychiatric medications -valproic acid 750 mg qhs, zoloft 25 mg, and seroquel 300 mg and gabapentin 600 mg bID. Based on chart review, patient was recently started on zoloft on 02/08/21 for compulsive behavior.  By history, it appears that patient has a h/o developing hyponatremia with psychiatric medications.  Zoloft is an SSRI and SSRIs are identified in the [place] Criteria as potentially inappropriate medications to be used with caution in  patients 65 years and older due to the potential to cause or exacerbate syndrome of inappropriate antidiuretic hormone secretion (SIADH) or hyponatremia. Suspect that initiation of this medication may have contributed to patient developing hyponatremia as this medication was started ~11 days ago.   10/22 Patient reports medication compliance with VPA; however, level on admission was <10 suggesting otherwise. VPA, gabapentin and zoloft all have had some associated  with hyponatremia-suspect zoloft is the largest offender in this case.   Sodium has improved from 117-124.  10/23 Na improved to 128. Patient doing well on seoquel 300 mg qhs. Continue to hold other psychiatric medications in the setting of hyponatremia.   Recommendations -Stop zoloft 25 mg as there is high suspicion it may have contributed to hyponatremia -continue seroquel 300 mg qhs for mood -hold valproic acid as it does not appear that patient is taking (VPA<10 on admission)  -hold gabapentin 600 mg BID for now; can consider restarting once sodium normalizes.  Psychiatry to continue to follow  Communicated recommendations to Dr. Candiss Norse via epic secure chat  Disposition: No evidence of imminent risk to self or others at present.   Patient does not meet criteria for psychiatric inpatient admission.  Ival Bible, MD Attending psychiatrist 02/20/2021 12:04 PM

## 2021-02-20 NOTE — Progress Notes (Signed)
Pt asking multiple times for more water even though he still has cup with water in it at bedside.  RN educated pt several times on fluid restriction due to low sodium. Pt verbalizes understanding but continues to ask for more water.

## 2021-02-20 NOTE — Progress Notes (Signed)
Pt sitting up in bed, denies pain or discomfort at this time, prn stool softener provided at patient request, educated pt on amount of water to drink daily, pt verbalized understanding, fluid restriction chart provided for patient's view at bedside, call bell within reach, no concerns noted, no changes from am assessment.

## 2021-02-20 NOTE — Progress Notes (Signed)
PROGRESS NOTE        PATIENT DETAILS Name: Douglas Perkins. Age: 75 y.o. Sex: male Date of Birth: 06-18-1945 Admit Date: 02/18/2021 Admitting Physician Karmen Bongo, MD LMB:[id number], Lucianne Muss, MD  Brief Narrative: Patient is a 75 y.o. male with history of bipolar disorder, HTN, OSA not on CPAP-presented with confusion-he was found to have hyponatremia.  Pertinent Labs/Radiology: WBC: 8.4 Hb: 10.5 Na: 124 K: 4.1 Creatinine: 0.64  10/21>> urine osmolality: 285 10/21>> serum osmolality: 255 10/21>> urine sodium: 70  10/21>>CXR: No pneumonia 10/21>> CT head: No acute abnormality.   Subjective: Patient in bed, appears comfortable, denies any headache, no fever, no chest pain or pressure, no shortness of breath , no abdominal pain. No new focal weakness.   Vitals: Blood pressure (!) 133/57, pulse 80, temperature 98.2 F (36.8 C), temperature source Oral, resp. rate 18, height 6' (1.829 m), weight 99.8 kg, SpO2 95 %.   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 Stable TSH, better with fluid restriction.  Acute metabolic encephalopathy: Seems to have improved-etiology felt to be hyponatremia.  Bipolar disorder: Remains on Depakote/Zoloft/Seroquel/hydroxyzine/Neurontin-Psych following currently on Seroquel only.  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  Procedures: None Consults: None DVT Prophylaxis: Lovenox Code Status:Full code Family Communication:Spouse-Frances- [phone] updated on 10/22  Time spent: 20 minutes-Greater than 50% of this time was spent in counseling, explanation of diagnosis, planning of further management, and coordination of care.  Diet: Diet Order             Diet Heart Room service appropriate? Yes; Fluid consistency: Thin; Fluid restriction: 1500 mL Fluid  Diet effective now                      Disposition Plan: Status is: Observation  The patient will require care spanning > 2 midnights and should be moved to inpatient because: Severe hyponatremia with confusion-require inpatient level of care.    Barriers to Discharge: Resolving encephalopathy-improving hyponatremia-not yet stable for discharge-sodium levels are not yet back to baseline.  Antimicrobial agents: Anti-infectives (From admission, onward)    None        MEDICATIONS: Scheduled Meds:  amLODipine  10 mg Oral Daily   aspirin EC  81 mg Oral Daily   atorvastatin  40 mg Oral 1 day or 1 dose   carvedilol  12.5 mg Oral BID WC   docusate sodium  100 mg Oral BID   enoxaparin (LOVENOX) injection  40 mg Subcutaneous Q24H   hydrOXYzine  100 mg Oral QHS   latanoprost  1 drop Right Eye BID   pantoprazole  40 mg Oral BID   QUEtiapine  300 mg Oral QHS   sodium chloride flush  3 mL Intravenous Q12H  Continuous Infusions: PRN Meds:.acetaminophen **OR** acetaminophen, albuterol, bisacodyl, hydrALAZINE, HYDROcodone-acetaminophen, ondansetron **OR** ondansetron (ZOFRAN) IV, polyethylene glycol   I have personally reviewed following labs and imaging studies  LABORATORY DATA:  Recent Labs  Lab 02/18/21 1127 02/19/21 0246  WBC 8.9 8.4  HGB 11.4* 10.5*  HCT 32.0* 28.7*  PLT 375 341  MCV 90.9 90.3  MCH 32.4 33.0  MCHC 35.6 36.6*  RDW 12.4 12.7     Recent Labs  Lab 02/18/21 1127 02/18/21 1621 02/18/21 2120 02/19/21 0246 02/19/21 0743 02/20/21 0052  NA 117* 119* 123* 123* 124* 128*  K 4.6 4.0 4.1 4.4 4.1 4.2  CL 82* 88* 91* 92* 94* 98  CO2 22 19* 23 20* 23 23  GLUCOSE 124* 114* 112* 84 126* 91  BUN 9 7* 7* 9 9 10   CREATININE 0.71 0.60* 0.67 0.70 0.64 0.73  CALCIUM 8.6* 8.0* 8.4* 8.2* 8.1* 8.3*  AST 59*  --   --   --   --   --   ALT 41  --   --   --   --   --   ALKPHOS 76  --   --   --   --   --   BILITOT 1.0  --   --   --   --   --   ALBUMIN 4.0  --   --   --   --   --   TSH  --   --   --   --   --  0.432      Urine analysis:    Component Value Date/Time   COLORURINE YELLOW 02/18/2021 1143   APPEARANCEUR CLEAR 02/18/2021 1143   APPEARANCEUR Clear 05/24/2015 1328   LABSPEC 1.025 02/18/2021 1143   PHURINE 5.0 02/18/2021 [place] 02/18/2021 [place] 02/18/2021 [place] 02/18/2021 1143   BILIRUBINUR Negative 05/24/2015 [place] 02/18/2021 1143   PROTEINUR NEGATIVE 02/18/2021 1143   NITRITE NEGATIVE 02/18/2021 1143   LEUKOCYTESUR NEGATIVE 02/18/2021 1143    Sepsis Labs: Lactic Acid, Venous    Component Value Date/Time   LATICACIDVEN 1.9 11/21/2017 0823    RADIOLOGY STUDIES/RESULTS: No results found.   LOS: 1 day   Signature  Lala Lund M.D on 02/20/2021 at 12:45 PM   -  To page go to [email or web address]

## 2021-02-20 NOTE — Evaluation (Signed)
Physical Therapy Evaluation Patient Details Name: Douglas Perkins. MRN: [id number] DOB: 11/11/1945 Today's Date: 02/20/2021  History of Present Illness  75 y.o. male who presented with AMS from his ALF on 10/21, found to have hyponatremia. PMH includes bipolar disorder, prostate cancer, HTN, TIA  Clinical Impression  Pt admitted secondary to problem above with deficits below. Pt requiring min A to stand and transfer to chair without AD. Noted decreased safety awareness and required safety cues throughout. Feel pt would benefit from PT follow up at ALF to address current deficits. Will continue to follow acutely.        Recommendations for follow up therapy are one component of a multi-disciplinary discharge planning process, led by the attending physician.  Recommendations may be updated based on patient status, additional functional criteria and insurance authorization.  Follow Up Recommendations Home health PT (PT follow up at ALF)    Equipment Recommendations  None recommended by PT    Recommendations for Other Services       Precautions / Restrictions Precautions Precautions: Fall Restrictions Weight Bearing Restrictions: No      Mobility  Bed Mobility Overal bed mobility: Needs Assistance Bed Mobility: Sit to Supine       Sit to supine: Supervision   General bed mobility comments: Supervision for safety    Transfers Overall transfer level: Needs assistance Equipment used: 1 person hand held assist Transfers: Sit to/from Stand;Stand Pivot Transfers Sit to Stand: Min assist Stand pivot transfers: Min assist       General transfer comment: Min A for steadying to stand and transfer back to bed from chair. Min A for steadying assist with pt holding to PT arms. Further mobility deferred as pt wanting to eat lunch  Ambulation/Gait                Stairs            Wheelchair Mobility    Modified Rankin (Stroke Patients Only)       Balance  Overall balance assessment: Needs assistance Sitting-balance support: Feet supported Sitting balance-Leahy Scale: Good     Standing balance support: Bilateral upper extremity supported Standing balance-Leahy Scale: Poor Standing balance comment: Reliant on UE support                             Pertinent Vitals/Pain Pain Assessment: No/denies pain    Home Living Family/patient expects to be discharged to:: Assisted living               Home Equipment: Walker - 4 wheels;Shower seat;Grab bars - tub/shower;Hand held shower head;Cane - single point      Prior Function Level of Independence: Independent with assistive device(s)   Gait / Transfers Assistance Needed: Walks in the halls to common dinnig room with 4WRW.  Does not use the RW in his room.  ADL's / Homemaking Assistance Needed: Independent with bathing and dressing in his room.  Medications are provided by staff.  No assist with toileting.        Hand Dominance        Extremity/Trunk Assessment   Upper Extremity Assessment Upper Extremity Assessment: Defer to OT evaluation    Lower Extremity Assessment Lower Extremity Assessment: Generalized weakness    Cervical / Trunk Assessment Cervical / Trunk Assessment: Kyphotic  Communication   Communication: No difficulties  Cognition Arousal/Alertness: Awake/alert Behavior During Therapy: Restless Overall Cognitive Status: Impaired/Different from baseline Area of Impairment:  Awareness;Safety/judgement;Following commands;Attention                   Current Attention Level: Selective   Following Commands: Follows one step commands consistently Safety/Judgement: Decreased awareness of safety;Decreased awareness of deficits Awareness: Emergent   General Comments: Pt with decreased safety awareness and required cues to wait for PT      General Comments General comments (skin integrity, edema, etc.): Pt's wife present during session     Exercises General Exercises - Lower Extremity Ankle Circles/Pumps: AROM;Both;20 reps Heel Slides: AROM;Both;10 reps;Supine   Assessment/Plan    PT Assessment Patient needs continued PT services  PT Problem List Decreased strength;Decreased activity tolerance;Decreased balance;Decreased mobility;Decreased cognition;Decreased knowledge of use of DME;Decreased safety awareness;Decreased knowledge of precautions       PT Treatment Interventions DME instruction;Gait training;Stair training;Functional mobility training;Therapeutic activities;Therapeutic exercise;Balance training;Patient/family education    PT Goals (Current goals can be found in the Care Plan section)  Acute Rehab PT Goals Patient Stated Goal: to go back to ALF PT Goal Formulation: With patient Time For Goal Achievement: 03/06/21 Potential to Achieve Goals: Good    Frequency Min 3X/week   Barriers to discharge        Co-evaluation               AM-PAC PT "6 Clicks" Mobility  Outcome Measure Help needed turning from your back to your side while in a flat bed without using bedrails?: None Help needed moving from lying on your back to sitting on the side of a flat bed without using bedrails?: A Little Help needed moving to and from a bed to a chair (including a wheelchair)?: A Little Help needed standing up from a chair using your arms (e.g., wheelchair or bedside chair)?: A Little Help needed to walk in hospital room?: A Little Help needed climbing 3-5 steps with a railing? : A Lot 6 Click Score: 18    End of Session Equipment Utilized During Treatment: Gait belt Activity Tolerance: Patient tolerated treatment well Patient left: in bed;with call bell/phone within reach;with bed alarm set;with family/visitor present Nurse Communication: Mobility status PT Visit Diagnosis: Unsteadiness on feet (R26.81);Muscle weakness (generalized) (M62.81)    Time: 6378-5885 PT Time Calculation (min) (ACUTE ONLY): 15  min   Charges:   PT Evaluation $PT Eval Moderate Complexity: 1 Mod          Reuel Derby, PT, DPT  Acute Rehabilitation Services  Pager: [phone] Office: [phone]   Rudean Hitt 02/20/2021, 12:49 PM

## 2021-02-21 DIAGNOSIS — G9341 Metabolic encephalopathy: Secondary | ICD-10-CM | POA: Diagnosis not present

## 2021-02-21 LAB — COMPREHENSIVE METABOLIC PANEL
ALT: 30 U/L (ref 0–44)
AST: 30 U/L (ref 15–41)
Albumin: 3.3 g/dL — ABNORMAL LOW (ref 3.5–5.0)
Alkaline Phosphatase: 64 U/L (ref 38–126)
Anion gap: 12 (ref 5–15)
BUN: 14 mg/dL (ref 8–23)
CO2: 22 mmol/L (ref 22–32)
Calcium: 8.9 mg/dL (ref 8.9–10.3)
Chloride: 90 mmol/L — ABNORMAL LOW (ref 98–111)
Creatinine, Ser: 0.75 mg/dL (ref 0.61–1.24)
GFR, Estimated: >60 mL/min (ref >60)
Glucose, Bld: 109 mg/dL — ABNORMAL HIGH (ref 70–99)
Potassium: 4.1 mmol/L (ref 3.5–5.1)
Sodium: 124 mmol/L — ABNORMAL LOW (ref 135–145)
Total Bilirubin: 0.9 mg/dL (ref 0.3–1.2)
Total Protein: 6.4 g/dL — ABNORMAL LOW (ref 6.5–8.1)

## 2021-02-21 LAB — CBC WITH DIFFERENTIAL/PLATELET
Abs Immature Granulocytes: 0.04 10*3/uL (ref 0.00–0.07)
Basophils Absolute: 0 10*3/uL (ref 0.0–0.1)
Basophils Relative: 0 %
Eosinophils Absolute: 0.2 10*3/uL (ref 0.0–0.5)
Eosinophils Relative: 3 %
HCT: 34 % — ABNORMAL LOW (ref 39.0–52.0)
Hemoglobin: 11.9 g/dL — ABNORMAL LOW (ref 13.0–17.0)
Immature Granulocytes: 1 %
Lymphocytes Relative: 25 %
Lymphs Abs: 2.2 10*3/uL (ref 0.7–4.0)
MCH: 32.2 pg (ref 26.0–34.0)
MCHC: 35 g/dL (ref 30.0–36.0)
MCV: 91.9 fL (ref 80.0–100.0)
Monocytes Absolute: 1.2 10*3/uL — ABNORMAL HIGH (ref 0.1–1.0)
Monocytes Relative: 13 %
Neutro Abs: 5.2 10*3/uL (ref 1.7–7.7)
Neutrophils Relative %: 58 %
Platelets: 360 10*3/uL (ref 150–400)
RBC: 3.7 MIL/uL — ABNORMAL LOW (ref 4.22–5.81)
RDW: 12.6 % (ref 11.5–15.5)
WBC: 8.8 10*3/uL (ref 4.0–10.5)
nRBC: 0 % (ref 0.0–0.2)

## 2021-02-21 LAB — URINALYSIS, ROUTINE W REFLEX MICROSCOPIC
Bilirubin Urine: NEGATIVE
Glucose, UA: NEGATIVE mg/dL
Hgb urine dipstick: NEGATIVE
Ketones, ur: NEGATIVE mg/dL
Leukocytes,Ua: NEGATIVE
Nitrite: NEGATIVE
Protein, ur: NEGATIVE mg/dL
Specific Gravity, Urine: 1.016 (ref 1.005–1.030)
pH: 6 (ref 5.0–8.0)

## 2021-02-21 LAB — OSMOLALITY: Osmolality: 268 mOsm/kg — ABNORMAL LOW (ref 275–295)

## 2021-02-21 LAB — MAGNESIUM: Magnesium: 1.8 mg/dL (ref 1.7–2.4)

## 2021-02-21 LAB — OSMOLALITY, URINE: Osmolality, Ur: 539 mOsm/kg (ref 300–900)

## 2021-02-21 LAB — URIC ACID: Uric Acid, Serum: 4.3 mg/dL (ref 3.7–8.6)

## 2021-02-21 LAB — BRAIN NATRIURETIC PEPTIDE: B Natriuretic Peptide: 53.3 pg/mL (ref 0.0–100.0)

## 2021-02-21 LAB — SODIUM, URINE, RANDOM: Sodium, Ur: 16 mmol/L

## 2021-02-21 MED ORDER — LACTATED RINGERS IV SOLN: INTRAVENOUS | Status: DC

## 2021-02-21 MED ORDER — LACTATED RINGERS IV SOLN: INTRAVENOUS | Status: AC

## 2021-02-21 NOTE — Progress Notes (Signed)
Occupational Therapy Treatment Patient Details Name: Douglas Perkins. MRN: [id number] DOB: [birth year] Today's Date: 02/21/2021   History of present illness 75 y.o. male who presented with AMS from his ALF on 10/21, found to have hyponatremia. PMH includes bipolar disorder, prostate cancer, HTN, TIA   OT comments  Pt presented in bed and agreed to session. Pt at this time required supervision for bed mobility to pace self, min guard with transfers and functional ambulation and set up with min guard for UE dressing. Pt requires cues for safety in session. Pt currently with functional limitations due to the deficits listed below (see OT Problem List).  Pt will benefit from skilled OT to increase their safety and independence with ADL and functional mobility for ADL to facilitate discharge to venue listed below.     Recommendations for follow up therapy are one component of a multi-disciplinary discharge planning process, led by the attending physician.  Recommendations may be updated based on patient status, additional functional criteria and insurance authorization.    Follow Up Recommendations  None     Assistance Recommended at Discharge PRN  Equipment Recommendations  None recommended by OT    Recommendations for Other Services      Precautions / Restrictions Precautions Precautions: Fall Restrictions Weight Bearing Restrictions: No       Mobility Bed Mobility Overal bed mobility: Needs Assistance Bed Mobility: Sit to Supine     Supine to sit: Supervision Sit to supine: Supervision   General bed mobility comments: supervision to pace self    Transfers Overall transfer level: Needs assistance Equipment used: Rolling walker (2 wheels) Transfers: Sit to/from Stand Sit to Stand: Min guard           General transfer comment: min guard to pace self     Balance Overall balance assessment: Needs assistance Sitting-balance support: Feet supported Sitting  balance-Leahy Scale: Good     Standing balance support: Bilateral upper extremity supported Standing balance-Leahy Scale: Poor Standing balance comment: Reliant on UE support                           ADL either performed or assessed with clinical judgement   ADL Overall ADL's : Needs assistance/impaired Eating/Feeding: Set up;Cueing for safety;Cueing for sequencing;Sitting   Grooming: Wash/dry hands;Wash/dry face;Set up;Sitting   Upper Body Bathing: Sitting;Min guard   Lower Body Bathing: Cueing for safety;Cueing for sequencing;Sit to/from stand;Min guard   Upper Body Dressing : Min guard;Cueing for safety;Cueing for sequencing;Sitting   Lower Body Dressing: Min guard;Cueing for safety;Cueing for sequencing;Sit to/from stand   Toilet Transfer: Min guard;Cueing for safety;Cueing for sequencing;Rolling walker (2 wheels)   Toileting- Water quality scientist and Hygiene: Min guard;Cueing for safety;Cueing for sequencing;Sit to/from stand               Vision   Vision Assessment?:  (glasses)   Perception Perception Perception: Not tested   Praxis      Cognition Arousal/Alertness: Awake/alert   Overall Cognitive Status: No family/caregiver present to determine baseline cognitive functioning Area of Impairment: Problem solving;Safety/judgement                       Following Commands: Follows one step commands consistently Safety/Judgement: Decreased awareness of safety;Decreased awareness of deficits Awareness: Emergent   General Comments: Pt requires cues to pace self          Exercises     Shoulder Instructions  General Comments      Pertinent Vitals/ Pain       Pain Assessment: No/denies pain  Home Living                                          Prior Functioning/Environment              Frequency  Min 2X/week        Progress Toward Goals  OT Goals(current goals can now be found in the care  plan section)  Progress towards OT goals: Progressing toward goals  Acute Rehab OT Goals OT Goal Formulation: With patient Time For Goal Achievement: 03/05/21 Potential to Achieve Goals: Good ADL Goals Pt Will Perform Grooming: with modified independence;standing Pt Will Perform Lower Body Bathing: with modified independence;sit to/from stand Pt Will Perform Lower Body Dressing: with modified independence;sit to/from stand Pt Will Transfer to Toilet: with modified independence;ambulating;regular height toilet  Plan Discharge plan remains appropriate    Co-evaluation                 AM-PAC OT "6 Clicks" Daily Activity     Outcome Measure   Help from another person eating meals?: None Help from another person taking care of personal grooming?: None Help from another person toileting, which includes using toliet, bedpan, or urinal?: A Little Help from another person bathing (including washing, rinsing, drying)?: A Little Help from another person to put on and taking off regular upper body clothing?: None Help from another person to put on and taking off regular lower body clothing?: A Little 6 Click Score: 21    End of Session Equipment Utilized During Treatment: Rolling walker (2 wheels);Gait belt  OT Visit Diagnosis: Unsteadiness on feet (R26.81)   Activity Tolerance Patient tolerated treatment well   Patient Left in chair;with call bell/phone within reach;with chair alarm set   Nurse Communication  (fluids)        Time: 1224-8250 OT Time Calculation (min): 29 min  Charges: OT General Charges $OT Visit: 1 Visit OT Treatments $Self Care/Home Management : 23-37 mins  Joeseph Amor OTR/L  Acute Rehab Services  [phone] office number [phone] pager number   Joeseph Amor 02/21/2021, 8:18 AM

## 2021-02-21 NOTE — Progress Notes (Signed)
Pt sitting up in chair, denies pain or discomfort at this time, educated pt on amount of fluid he can intake today, pt verbalized understanding, all needs addressed, reassessment with no changes from am assessment.

## 2021-02-21 NOTE — Progress Notes (Signed)
PROGRESS NOTE        PATIENT DETAILS Name: Douglas Perkins. Age: 75 y.o. Sex: male Date of Birth: Jul 26, 1945 Admit Date: 02/18/2021 Admitting Physician Karmen Bongo, MD [id number], Lucianne Muss, MD  Brief Narrative: Patient is a 75 y.o. male with history of bipolar disorder, HTN, OSA not on CPAP-presented with confusion-he was found to have hyponatremia.  Pertinent Labs/Radiology: WBC: 8.4 Hb: 10.5 Na: 124 K: 4.1 Creatinine: 0.64  10/21>> urine osmolality: 285 10/21>> serum osmolality: 255 10/21>> urine sodium: 70  10/21>>CXR: No pneumonia 10/21>> CT head: No acute abnormality.   Subjective: Patient in bed, appears comfortable, denies any headache, no fever, no chest pain or pressure, no shortness of breath , no abdominal pain. No new focal weakness.    Vitals: Blood pressure 123/66, pulse 97, temperature 97.6 F (36.4 C), resp. rate 18, height 6' (1.829 m), weight 99.8 kg, SpO2 96 %.   Exam:  Awake Alert, No new F.N deficits, Normal affect [place].AT,PERRAL Supple Neck, No JVD,   Symmetrical Chest wall movement, Good air movement bilaterally, CTAB RRR,No Gallops, Rubs or new Murmurs,  +ve B.Sounds, Abd Soft, No tenderness,   No Cyanosis, Clubbing or edema,        Assessment/Plan:  Hyponatremia: Initially had SIADH but now electrolytes suggestive of true dehydration, continue free water restriction by mouth but will hydrate with IV fluids and monitor.  Acute metabolic encephalopathy: Seems to have improved-etiology felt to be hyponatremia.  Bipolar disorder: Remains on Depakote/Zoloft/Seroquel/hydroxyzine/Neurontin-Psych following currently on Seroquel only.  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  Procedures: None Consults: None DVT Prophylaxis: Lovenox Code Status:Full code Family Communication:Spouse-Frances- [phone] updated on 10/22  Time spent: 61 minutes-Greater than 50% of this time was spent in counseling, explanation of diagnosis, planning of further management, and coordination of care.  Diet: Diet Order             Diet Heart Room service appropriate? Yes; Fluid consistency: Thin; Fluid restriction: 1500 mL Fluid  Diet effective now                      Disposition Plan: Status is: Observation  The patient will require care spanning > 2 midnights and should be moved to inpatient because: Severe hyponatremia with confusion-require inpatient level of care.  Barriers to Discharge: Resolving encephalopathy-improving hyponatremia-not yet stable for discharge-sodium levels are not yet back to baseline.  Antimicrobial agents: Anti-infectives (From admission, onward)    None        MEDICATIONS: Scheduled Meds:  amLODipine  10 mg Oral Daily   aspirin EC  81 mg Oral Daily   atorvastatin  40 mg Oral Daily   carvedilol  12.5 mg Oral BID WC   docusate sodium  100 mg Oral BID   enoxaparin (LOVENOX) injection  40 mg Subcutaneous Q24H   hydrOXYzine  100 mg Oral QHS   latanoprost  1 drop Right Eye BID   pantoprazole  40 mg Oral BID   QUEtiapine  300 mg Oral QHS   sodium chloride flush  3 mL Intravenous Q12H  Continuous Infusions:  lactated ringers     PRN Meds:.acetaminophen **OR** acetaminophen, albuterol, bisacodyl, hydrALAZINE, HYDROcodone-acetaminophen, ondansetron **OR** ondansetron (ZOFRAN) IV, polyethylene glycol   I have personally reviewed following labs and imaging studies  LABORATORY DATA:  Recent Labs  Lab 02/18/21 1127 02/19/21 0246 02/21/21 0155  WBC 8.9 8.4 8.8  HGB 11.4* 10.5* 11.9*  HCT 32.0* 28.7* 34.0*  PLT 375 341 360  MCV 90.9 90.3 91.9  MCH  32.4 33.0 32.2  MCHC 35.6 36.6* 35.0  RDW 12.4 12.7 12.6  LYMPHSABS  --   --  2.2  MONOABS  --   --  1.2*  EOSABS  --   --  0.2  BASOSABS  --   --  0.0    Recent Labs  Lab 02/18/21 1127 02/18/21 1621 02/18/21 2120 02/19/21 0246 02/19/21 0743 02/20/21 0052 02/21/21 0155  NA 117*   < > 123* 123* 124* 128* 124*  K 4.6   < > 4.1 4.4 4.1 4.2 4.1  CL 82*   < > 91* 92* 94* 98 90*  CO2 22   < > 23 20* 23 23 22   GLUCOSE 124*   < > 112* 84 126* 91 109*  BUN 9   < > 7* 9 9 10 14   CREATININE 0.71   < > 0.67 0.70 0.64 0.73 0.75  CALCIUM 8.6*   < > 8.4* 8.2* 8.1* 8.3* 8.9  AST 59*  --   --   --   --   --  30  ALT 41  --   --   --   --   --  30  ALKPHOS 76  --   --   --   --   --  64  BILITOT 1.0  --   --   --   --   --  0.9  ALBUMIN 4.0  --   --   --   --   --  3.3*  MG  --   --   --   --   --   --  1.8  TSH  --   --   --   --   --  0.432  --   BNP  --   --   --   --   --   --  53.3   < > = values in this interval not displayed.      Urine analysis:    Component Value Date/Time   COLORURINE YELLOW 02/21/2021 0746   APPEARANCEUR CLEAR 02/21/2021 0746   APPEARANCEUR Clear 05/24/2015 1328   LABSPEC 1.016 02/21/2021 0746   PHURINE 6.0 02/21/2021 0746   GLUCOSEU NEGATIVE 02/21/2021 0746   HGBUR NEGATIVE 02/21/2021 0746   BILIRUBINUR NEGATIVE 02/21/2021 0746   BILIRUBINUR Negative 05/24/2015 [place] 02/21/2021 0746   PROTEINUR NEGATIVE 02/21/2021 0746   NITRITE NEGATIVE 02/21/2021 0746   LEUKOCYTESUR NEGATIVE 02/21/2021 0746    Sepsis Labs: Lactic Acid, Venous    Component Value Date/Time   LATICACIDVEN 1.9 11/21/2017 0823    RADIOLOGY STUDIES/RESULTS: No results found.   LOS: 2 days   Signature  Lala Lund M.D on 02/21/2021 at 11:47 AM   -  To page go to [email or web address]

## 2021-02-21 NOTE — TOC Initial Note (Signed)
Transition of Care (TOC) - Initial/Assessment Note    Patient Details  Name: Douglas Perkins. MRN: [id number] Date of Birth: [birth year]  Transition of Care [facility]) CM/SW Contact:    Benard Halsted, LCSW Phone Number: 02/21/2021, 4:09 PM  Clinical Narrative:                 Patient resides at [facility]. CSW spoke with patient's nurse there and she provided fax number to send discharge paperwork to for review and they will set up home health services through their in-house therapies (f. [phone]).   Expected Discharge Plan: Assisted Living Barriers to Discharge: Continued Medical Work up   Patient Goals and CMS Choice Patient states their goals for this hospitalization and ongoing recovery are:: Return to ALF CMS Medicare.gov Compare Post Acute Care list provided to:: Patient Choice offered to / list presented to : Patient  Expected Discharge Plan and Services Expected Discharge Plan: Assisted Living In-house Referral: Clinical Social Work   Post Acute Care Choice:  (ALF)                                        Prior Living Arrangements/Services   Lives with:: Facility Resident Patient language and need for interpreter reviewed:: Yes Do you feel safe going back to the place where you live?: Yes      Need for Family Participation in Patient Care: No (Comment) Care giver support system in place?: Yes (comment)   Criminal Activity/Legal Involvement Pertinent to Current Situation/Hospitalization: No - Comment as needed  Activities of Daily Living      Permission Sought/Granted Permission sought to share information with : Facility Art therapist granted to share information with : Yes, Verbal Permission Granted     Permission granted to share info w AGENCY: [facility]        Emotional Assessment Appearance:: Appears stated age Attitude/Demeanor/Rapport: Engaged Affect (typically observed): Accepting,  Appropriate Orientation: : Oriented to Self, Oriented to Place, Oriented to  Time, Oriented to Situation Alcohol / Substance Use: Not Applicable Psych Involvement: No (comment)  Admission diagnosis:  Hyponatremia [E87.1] Altered mental status, unspecified altered mental status type [E09.23] Acute metabolic encephalopathy [R00.76] Patient Active Problem List   Diagnosis Date Noted   Pressure injury of skin 22/63/3354   Acute metabolic encephalopathy 56/25/6389   Hyponatremia syndrome 02/18/2021   OSA (obstructive sleep apnea) 11/26/2019   Cardiac pacemaker 04/30/2019   History of sinoatrial node dysfunction 04/30/2019   Other male erectile dysfunction 11/12/2018   Near syncope 05/22/2017   Coronary artery disease involving native coronary artery of native heart 01/10/2017   Low serum testosterone level 06/16/2016   Obesity (BMI 30-39.9) 03/09/2016   Syncope 07/19/2015   Sepsis ([place]) 07/07/2015   Bipolar I disorder ([place]) 06/24/2015   Bilateral pneumonia 06/23/2015   Bipolar affective disorder, currently manic, mild ([place]) 06/18/2015   History of duodenal ulcer 06/18/2015   Vaccine counseling 06/18/2015   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   Bradycardia 02/08/2015   Hyperlipidemia, mixed 10/14/2014   AF (amaurosis fugax) 09/16/2014   Benign essential hypertension 09/16/2014   Chest pain 09/16/2014   Skin cyst 08/06/2012   Personal history of lymphoma 08/06/2012   Cancer ([place])    PCP:  [name], MD Pharmacy:   [facility] [address] [place], [place] - [address] [facility] [place] [address] Phone: [phone] Fax: [phone]  [id number] [address], [place] [address] Phone: [phone] Fax: [phone]     Social Determinants of Health (SDOH) Interventions    Readmission Risk  Interventions No flowsheet data found.

## 2021-02-21 NOTE — Progress Notes (Signed)
Ambulated with patient to restroom using front wheel walker, pt reported bowel movement, right buttock stage 1, unblanchable erythema noted, incontinent cream placed, pt sitting up on side of bed feeding self, all needs addressed, reassessment unchanged from am assessment.

## 2021-02-22 DIAGNOSIS — G9341 Metabolic encephalopathy: Secondary | ICD-10-CM | POA: Diagnosis not present

## 2021-02-22 LAB — COMPREHENSIVE METABOLIC PANEL
ALT: 29 U/L (ref 0–44)
AST: 25 U/L (ref 15–41)
Albumin: 3.5 g/dL (ref 3.5–5.0)
Alkaline Phosphatase: 64 U/L (ref 38–126)
Anion gap: 10 (ref 5–15)
BUN: 11 mg/dL (ref 8–23)
CO2: 22 mmol/L (ref 22–32)
Calcium: 9 mg/dL (ref 8.9–10.3)
Chloride: 95 mmol/L — ABNORMAL LOW (ref 98–111)
Creatinine, Ser: 0.66 mg/dL (ref 0.61–1.24)
GFR, Estimated: >60 mL/min (ref >60)
Glucose, Bld: 105 mg/dL — ABNORMAL HIGH (ref 70–99)
Potassium: 4.2 mmol/L (ref 3.5–5.1)
Sodium: 127 mmol/L — ABNORMAL LOW (ref 135–145)
Total Bilirubin: 0.9 mg/dL (ref 0.3–1.2)
Total Protein: 6.9 g/dL (ref 6.5–8.1)

## 2021-02-22 LAB — CBC WITH DIFFERENTIAL/PLATELET
Abs Immature Granulocytes: 0.03 10*3/uL (ref 0.00–0.07)
Basophils Absolute: 0 10*3/uL (ref 0.0–0.1)
Basophils Relative: 1 %
Eosinophils Absolute: 0.2 10*3/uL (ref 0.0–0.5)
Eosinophils Relative: 3 %
HCT: 34.7 % — ABNORMAL LOW (ref 39.0–52.0)
Hemoglobin: 12.2 g/dL — ABNORMAL LOW (ref 13.0–17.0)
Immature Granulocytes: 0 %
Lymphocytes Relative: 30 %
Lymphs Abs: 2.2 10*3/uL (ref 0.7–4.0)
MCH: 32 pg (ref 26.0–34.0)
MCHC: 35.2 g/dL (ref 30.0–36.0)
MCV: 91.1 fL (ref 80.0–100.0)
Monocytes Absolute: 0.9 10*3/uL (ref 0.1–1.0)
Monocytes Relative: 12 %
Neutro Abs: 4 10*3/uL (ref 1.7–7.7)
Neutrophils Relative %: 54 %
Platelets: 357 10*3/uL (ref 150–400)
RBC: 3.81 MIL/uL — ABNORMAL LOW (ref 4.22–5.81)
RDW: 12.6 % (ref 11.5–15.5)
WBC: 7.3 10*3/uL (ref 4.0–10.5)
nRBC: 0 % (ref 0.0–0.2)

## 2021-02-22 LAB — BRAIN NATRIURETIC PEPTIDE: B Natriuretic Peptide: 45.6 pg/mL (ref 0.0–100.0)

## 2021-02-22 LAB — MAGNESIUM: Magnesium: 2 mg/dL (ref 1.7–2.4)

## 2021-02-22 LAB — UREA NITROGEN, URINE: Urea Nitrogen, Ur: 873 mg/dL

## 2021-02-22 MED ORDER — BISACODYL 5 MG PO TBEC: 5.0000 mg | DELAYED_RELEASE_TABLET | Freq: Every day | ORAL | 0 refills | Status: DC | PRN

## 2021-02-22 MED ORDER — LACTATED RINGERS IV SOLN: INTRAVENOUS | Status: DC

## 2021-02-22 MED ORDER — LACTATED RINGERS IV SOLN: INTRAVENOUS | Status: AC

## 2021-02-22 NOTE — TOC Progression Note (Signed)
Transition of Care (TOC) - Progression Note    Patient Details  Name: Douglas Perkins. MRN: [id number] Date of Birth: 11-26-1945  Transition of Care [facility]) CM/SW [place], LCSW Phone Number: 02/22/2021, 10:28 AM  Clinical Narrative:    CSW faxed discharge paperwork to [facility] for review.    Expected Discharge Plan: Assisted Living Barriers to Discharge: Continued Medical Work up  Expected Discharge Plan and Services Expected Discharge Plan: Assisted Living In-house Referral: Clinical Social Work   Post Acute Care Choice:  (ALF)   Expected Discharge Date: 02/22/21                                     Social Determinants of Health (SDOH) Interventions    Readmission Risk Interventions No flowsheet data found.

## 2021-02-22 NOTE — Progress Notes (Signed)
Patient alert and oriented, no complaints of pain. Breathing is even and nonlabored. Educated wife and patient on discharge instructions. Both stated understandings and had no questions. D/C IV, with catheter intact. Patient in w/c taken by volunteer to [place] vehicle, she will be taking him to  [facility] with all belongings and discharge instruction packet.

## 2021-02-22 NOTE — Care Management Important Message (Signed)
Important Message  Patient Details  Name: Douglas Perkins. MRN: [id number] Date of Birth: [birth year]   Medicare Important Message Given:  Yes     Tanish Prien 02/22/2021, 4:08 PM

## 2021-02-22 NOTE — NC FL2 (Signed)
[place] LEVEL OF CARE SCREENING TOOL     IDENTIFICATION  Patient Name: Douglas Perkins. Birthdate: 1946/03/30 Sex: male Admission Date (Current Location): 02/18/2021  [facility] and [place] Number:  Herbalist and Address:  [facility], [place] [address], [place], [place] [address]      Provider Number: [id number]  Attending Physician Name and Address:  Thurnell Lose, MD  Relative Name and Phone Number:       Current Level of Care: Hospital Recommended Level of Care: [place] Prior Approval Number:    Date Approved/Denied:   PASRR Number:    Discharge Plan: Other (Comment) (ALF)    Current Diagnoses: Patient Active Problem List   Diagnosis Date Noted   Pressure injury of skin 19/37/9024   Acute metabolic encephalopathy 09/73/5329   Hyponatremia syndrome 02/18/2021   OSA (obstructive sleep apnea) 11/26/2019   Cardiac pacemaker 04/30/2019   History of sinoatrial node dysfunction 04/30/2019   Other male erectile dysfunction 11/12/2018   Near syncope 05/22/2017   Coronary artery disease involving native coronary artery of native heart 01/10/2017   Low serum testosterone level 06/16/2016   Obesity (BMI 30-39.9) 03/09/2016   Syncope 07/19/2015   Sepsis ([place]) 07/07/2015   Bipolar I disorder ([place]) 06/24/2015   Bilateral pneumonia 06/23/2015   Bipolar affective disorder, currently manic, mild ([place]) 06/18/2015   History of duodenal ulcer 06/18/2015   Vaccine counseling 06/18/2015   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   Bradycardia 02/08/2015   Hyperlipidemia, mixed 10/14/2014   AF (amaurosis fugax) 09/16/2014   Benign essential hypertension 09/16/2014   Chest pain 09/16/2014   Skin cyst 08/06/2012   Personal history of lymphoma 08/06/2012   Cancer ([place])     Orientation RESPIRATION BLADDER Height & Weight     Self, Time, Situation, Place  Normal  Continent Weight: 220 lb (99.8 kg) Height:  6' (182.9 cm)  BEHAVIORAL SYMPTOMS/MOOD NEUROLOGICAL BOWEL NUTRITION STATUS      Continent Diet (Regular)  AMBULATORY STATUS COMMUNICATION OF NEEDS Skin   Limited Assist Verbally Normal                       Personal Care Assistance Level of Assistance  Bathing, Feeding, Dressing Bathing Assistance: Limited assistance Feeding assistance: Independent Dressing Assistance: Limited assistance     Functional Limitations Info             [facility]  PT (By licensed PT)     PT Frequency: Home Health PT 3x/week              Contractures Contractures Info: Not present    Additional Factors Info  Code Status, Allergies, Psychotropic Code Status Info: Full   Psychotropic Info: Seroquel         Current Medications (02/22/2021):   Discharge Medications:   STOP taking these medications     cloNIDine 0.1 MG tablet Commonly known as: CATAPRES    lisinopril 40 MG tablet Commonly known as: ZESTRIL    OLANZapine 5 MG tablet Commonly known as: ZYPREXA    sertraline 25 MG tablet Commonly known as: ZOLOFT    spironolactone 25 MG tablet Commonly known as: ALDACTONE    valproic acid 250 MG/5ML solution Commonly known as: DEPAKENE           TAKE these medications     amLODipine 10 MG tablet Commonly known  as: NORVASC Take 10 mg by mouth daily.    aspirin EC 81 MG tablet Take 81 mg by mouth daily. Reported on 08/18/2015    atorvastatin 40 MG tablet Commonly known as: LIPITOR Take 1 tablet by mouth 1 day or 1 dose.    bisacodyl 5 MG EC tablet Commonly known as: DULCOLAX Take 1 tablet (5 mg total) by mouth daily as needed for moderate constipation.    carvedilol 25 MG tablet Commonly known as: COREG Take 25 mg by mouth in the morning and at bedtime.    Fish Oil 1000 MG Caps Take 1,000 mg by mouth 2 (two) times daily.    gabapentin 300 MG capsule Commonly known as: NEURONTIN Take 300  mg by mouth 2 (two) times daily.    hydrOXYzine 50 MG tablet Commonly known as: ATARAX/VISTARIL Take 100 mg by mouth at bedtime.    latanoprost 0.005 % ophthalmic solution Commonly known as: XALATAN Place 1 drop into the right eye 2 (two) times daily.    multivitamin with minerals Tabs tablet Take 1 tablet by mouth daily.    pantoprazole 40 MG tablet Commonly known as: PROTONIX Take 40 mg by mouth 2 (two) times daily.    QUEtiapine 300 MG tablet Commonly known as: SEROQUEL Take 300 mg by mouth at bedtime.    Relevant Imaging Results:  Relevant Lab Results:   Additional Information SSN: [place], [place]

## 2021-02-22 NOTE — TOC Transition Note (Signed)
Transition of Care [facility]) - CM/SW Discharge Note   Patient Details  Name: Maya Scholer. MRN: [id number] Date of Birth: April 18, 1946  Transition of Care [facility]) CM/SW Contact:  Benard Halsted, LCSW Phone Number: 02/22/2021, 1:10 PM   Clinical Narrative:    Patient will DC to: [place] [facility] Anticipated DC date: 02/22/21 Family notified: Spouse Transport by: Spouse by car   Per MD patient ready for DC to ALF. RN to call report prior to discharge [phone]). RN, patient, patient's family, and facility notified of DC. Discharge Summary and FL2 sent to facility.   CSW will sign off for now as social work intervention is no longer needed. Please consult [place] again if new needs arise.     Final next level of care: Assisted Living Barriers to Discharge: Barriers Resolved   Patient Goals and CMS Choice Patient states their goals for this hospitalization and ongoing recovery are:: Return to ALF CMS Medicare.gov Compare Post Acute Care list provided to:: Patient Choice offered to / list presented to : Patient  Discharge Placement                Patient to be transferred to facility by: car Name of family member notified: Spouse Patient and family notified of of transfer: 02/22/21  Discharge Plan and Services In-house Referral: Clinical Social Work   Post Acute Care Choice:  (ALF)                               Social Determinants of Health (SDOH) Interventions     Readmission Risk Interventions No flowsheet data found.

## 2021-02-22 NOTE — Discharge Instructions (Signed)
Follow with Primary MD [name], MD in 7 days   Get CBC, CMP, Magnesium, 2 view Chest X ray -  checked next visit within 1 week by Primary MD   Activity: As tolerated with Full fall precautions use walker/cane & assistance as needed  Disposition Home   Diet: Heart Healthy    Special Instructions: If you have smoked or chewed Tobacco  in the last 2 yrs please stop smoking, stop any regular Alcohol  and or any Recreational drug use.  On your next visit with your primary care physician please Get Medicines reviewed and adjusted.  Please request your Prim.MD to go over all Hospital Tests and Procedure/Radiological results at the follow up, please get all Hospital records sent to your Prim MD by signing hospital release before you go home.  If you experience worsening of your admission symptoms, develop shortness of breath, life threatening emergency, suicidal or homicidal thoughts you must seek medical attention immediately by calling 911 or calling your MD immediately  if symptoms less severe.  You Must read complete instructions/literature along with all the possible adverse reactions/side effects for all the Medicines you take and that have been prescribed to you. Take any new Medicines after you have completely understood and accpet all the possible adverse reactions/side effects.

## 2021-02-22 NOTE — Discharge Summary (Signed)
Douglas Perkins:[id number] DOB: 08/18/1945 DOA: 02/18/2021  PCP: [name], MD  Admit date: 02/18/2021  Discharge date: 02/22/2021  Admitted From: ALF  Disposition:  ALF   Recommendations for Outpatient Follow-up:   Follow up with PCP in 1-2 weeks  PCP Please obtain BMP/CBC, 2 view CXR in 1week,  (see Discharge instructions)   PCP Please follow up on the following pending results: monitor BMP, BP closely, outpt Renal follow up in 1 weeks   Home Health:  PT  Equipment/Devices: None  Consultations: None  Discharge Condition: Stable    CODE STATUS: Full    Diet Recommendation: Heart Healthy     Chief Complaint  Patient presents with   Altered Mental Status   Emesis     Brief history of present illness from the day of admission and additional interim summary    Patient is a 75 y.o. male with history of bipolar disorder, HTN, OSA not on CPAP-presented with confusion-he was found to have hyponatremia.   Pertinent  Radiology:   10/21>>CXR: No pneumonia 10/21>> CT head: No acute abnormality.                                                                 Hospital Course    Hyponatremia: Initially had SIADH but now electrolytes suggestive of true dehydration, much better post IVF, got another 1 liter today, symptom free, DC aldactone and SIADH inducing meds, DC home with PCP and Renal follow up in 1 week.   Acute metabolic encephalopathy: resolved - due to #1   Bipolar disorder: Remains on Depakote/Zoloft/Seroquel/hydroxyzine/Neurontin-Psych following currently on Seroquel only + Neurontin per Psych .   HTN: BP relatively stable - Meds adjusted as below, PCP to monitor.   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   Discharge diagnosis     Principal Problem:   Acute metabolic encephalopathy Active Problems:   Benign essential hypertension   Hyperlipidemia, mixed   Bipolar I disorder (HCC)   Cardiac pacemaker   OSA (obstructive sleep apnea)   Hyponatremia syndrome   Pressure injury of skin    Discharge instructions    Discharge Instructions     Diet - low sodium heart healthy   Complete by: As directed    Discharge instructions   Complete by: As directed    Follow with Primary MD [name], MD in 7 days   Get CBC, CMP, Magnesium, 2 view Chest X ray -  checked next visit within 1 week by Primary MD   Activity: As  tolerated with Full fall precautions use walker/cane & assistance as needed  Disposition Home   Diet: Heart Healthy    Special Instructions: If you have smoked or chewed Tobacco  in the last 2 yrs please stop smoking, stop any regular Alcohol  and or any Recreational drug use.  On your next visit with your primary care physician please Get Medicines reviewed and adjusted.  Please request your Prim.MD to go over all Hospital Tests and Procedure/Radiological results at the follow up, please get all Hospital records sent to your Prim MD by signing hospital release before you go home.  If you experience worsening of your admission symptoms, develop shortness of breath, life threatening emergency, suicidal or homicidal thoughts you must seek medical attention immediately by calling 911 or calling your MD immediately  if symptoms less severe.  You Must read complete instructions/literature along with all the possible adverse reactions/side effects for all the Medicines you take and that have been prescribed to you. Take any new Medicines after you have completely understood and accpet all the possible adverse  reactions/side effects.   Increase activity slowly   Complete by: As directed    No wound care   Complete by: As directed        Discharge Medications   Allergies as of 02/22/2021       Reactions   Other Hives, Shortness Of Breath, Other (See Comments)   Pt states that he is allergic to unwashed blood products.   Other reaction(s): Other (See Comments) UNWASHED BLOOD PRODUCTS  Pt states that he is allergic to unwashed blood products.     Feldene [piroxicam] Hives   Sulfa Antibiotics Hives        Medication List     STOP taking these medications    cloNIDine 0.1 MG tablet Commonly known as: CATAPRES   lisinopril 40 MG tablet Commonly known as: ZESTRIL   OLANZapine 5 MG tablet Commonly known as: ZYPREXA   sertraline 25 MG tablet Commonly known as: ZOLOFT   spironolactone 25 MG tablet Commonly known as: ALDACTONE   valproic acid 250 MG/5ML solution Commonly known as: DEPAKENE       TAKE these medications    amLODipine 10 MG tablet Commonly known as: NORVASC Take 10 mg by mouth daily.   aspirin EC 81 MG tablet Take 81 mg by mouth daily. Reported on 08/18/2015   atorvastatin 40 MG tablet Commonly known as: LIPITOR Take 1 tablet by mouth 1 day or 1 dose.   bisacodyl 5 MG EC tablet Commonly known as: DULCOLAX Take 1 tablet (5 mg total) by mouth daily as needed for moderate constipation.   carvedilol 25 MG tablet Commonly known as: COREG Take 25 mg by mouth in the morning and at bedtime.   Fish Oil 1000 MG Caps Take 1,000 mg by mouth 2 (two) times daily.   gabapentin 300 MG capsule Commonly known as: NEURONTIN Take 300 mg by mouth 2 (two) times daily.   hydrOXYzine 50 MG tablet Commonly known as: ATARAX/VISTARIL Take 100 mg by mouth at bedtime.   latanoprost 0.005 % ophthalmic solution Commonly known as: XALATAN Place 1 drop into the right eye 2 (two) times daily.   multivitamin with minerals Tabs tablet Take 1 tablet by mouth daily.    pantoprazole 40 MG tablet Commonly known as: PROTONIX Take 40 mg by mouth 2 (two) times daily.   QUEtiapine 300 MG tablet Commonly known as: SEROQUEL Take 300 mg by mouth at bedtime.  Follow-up Information     [name], MD. Schedule an appointment as soon as possible for a visit in 1 week(s).   Specialty: Family Medicine Contact information: [place] [address] [phone]         Rosita Fire, MD. Schedule an appointment as soon as possible for a visit in 1 week(s).   Specialties: Nephrology, Internal Medicine Why: Hyponatremia Contact information: [address] [place] [address] [phone]                 Major procedures and Radiology Reports - PLEASE review detailed and final reports thoroughly  -       CT Head Wo Contrast  Result Date: 02/18/2021 CLINICAL DATA:  Mental status change, unknown cause EXAM: CT HEAD WITHOUT CONTRAST TECHNIQUE: Contiguous axial images were obtained from the base of the skull through the vertex without intravenous contrast. COMPARISON:  07/10/2020 FINDINGS: Motion artifact is present. Brain: There is no acute intracranial hemorrhage, mass effect, or edema. 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 Stable findings of probable chronic microvascular ischemic changes in the cerebral white matter. Ventricles and sulci are prominent reflecting stable parenchymal volume loss. There is no extra-axial collection. Vascular: There is atherosclerotic calcification at the skull base. Skull: Calvarium is unremarkable. Sinuses/Orbits: No acute finding. Other: None. IMPRESSION: No acute intracranial abnormality. Stable chronic/nonemergent findings detailed above. Electronically Signed   By: Macy Mis M.D.   On: 02/18/2021 11:48   DG Chest Port 1 View  Result Date: 02/18/2021 CLINICAL DATA:  Weakness Fever EXAM: PORTABLE CHEST 1 VIEW COMPARISON:   07/21/2020 FINDINGS: Unchanged mild cardiomegaly. Left chest wall pacemaker is again seen. Right chest wall battery pack with lead extending cranially again seen. No pulmonary vascular congestion.  Lungs clear. IMPRESSION: Mild unchanged cardiomegaly. Electronically Signed   By: Miachel Roux M.D.   On: 02/18/2021 12:20     Today   Subjective    Douglas Perkins today has no headache,no chest abdominal pain,no new weakness tingling or numbness, feels much better wants to go home today.     Objective   Blood pressure 122/63, pulse 81, temperature 97.8 F (36.6 C), temperature source Oral, resp. rate 16, height 6' (1.829 m), weight 99.8 kg, SpO2 97 %.   Intake/Output Summary (Last 24 hours) at 02/22/2021 1004 Last data filed at 02/22/2021 0950 Gross per 24 hour  Intake 2415.59 ml  Output 3225 ml  Net -809.41 ml    Exam  Awake Alert, No new F.N deficits, Normal affect [place].AT,PERRAL Supple Neck,No JVD, No cervical lymphadenopathy appriciated.  Symmetrical Chest wall movement, Good air movement bilaterally, CTAB RRR,No Gallops,Rubs or new Murmurs, No Parasternal Heave +ve B.Sounds, Abd Soft, Non tender, No organomegaly appriciated, No rebound -guarding or rigidity. No Cyanosis, Clubbing or edema, No new Rash or bruise   Data Review   CBC w Diff:  Lab Results  Component Value Date   WBC 7.3 02/22/2021   HGB 12.2 (L) 02/22/2021   HGB 14.1 08/17/2014   HCT 34.7 (L) 02/22/2021   HCT 39.8 (L) 08/17/2014   PLT 357 02/22/2021   PLT 246 08/17/2014   LYMPHOPCT 30 02/22/2021   LYMPHOPCT 37.4 08/17/2014   MONOPCT 12 02/22/2021   MONOPCT 10.5 08/17/2014   EOSPCT 3 02/22/2021   EOSPCT 0.9 08/17/2014   BASOPCT 1 02/22/2021   BASOPCT 0.5 08/17/2014    CMP:  Lab Results  Component Value Date   NA 127 (L) 02/22/2021  NA 131 (L) 08/17/2014   K 4.2 02/22/2021   K 4.3 08/17/2014   CL 95 (L) 02/22/2021   CL 94 (L) 08/17/2014   CO2 22 02/22/2021   CO2 32 08/17/2014   BUN 11  02/22/2021   BUN 13 08/17/2014   CREATININE 0.66 02/22/2021   CREATININE 0.59 (L) 08/17/2014   PROT 6.9 02/22/2021   PROT 6.8 08/17/2014   ALBUMIN 3.5 02/22/2021   ALBUMIN 4.2 08/17/2014   BILITOT 0.9 02/22/2021   BILITOT 0.6 08/17/2014   ALKPHOS 64 02/22/2021   ALKPHOS 54 08/17/2014   AST 25 02/22/2021   AST 25 08/17/2014   ALT 29 02/22/2021   ALT 28 08/17/2014  .   Total Time in preparing paper work, data evaluation and todays exam - 47 minutes  Lala Lund M.D on 02/22/2021 at 10:04 AM  [facility]

## 2021-03-02 ENCOUNTER — Emergency Department (HOSPITAL_COMMUNITY)
Admission: EM | Admit: 2021-03-02 | Discharge: 2021-03-03 | Disposition: A | Discharge status: Other Acute Inpatient Hospital | Payer: Medicare Other | Attending: Emergency Medicine | Admitting: Emergency Medicine

## 2021-03-02 ENCOUNTER — Other Ambulatory Visit: Payer: Self-pay

## 2021-03-02 ENCOUNTER — Emergency Department (HOSPITAL_COMMUNITY): Payer: Medicare Other

## 2021-03-02 ENCOUNTER — Ambulatory Visit (HOSPITAL_COMMUNITY)
Admission: EM | Admit: 2021-03-02 | Discharge: 2021-03-02 | Disposition: A | Discharge status: Home / Self Care | Payer: Medicare Other

## 2021-03-02 ENCOUNTER — Encounter (HOSPITAL_COMMUNITY): Payer: Self-pay

## 2021-03-02 DIAGNOSIS — Z79899 Other long term (current) drug therapy: Secondary | ICD-10-CM | POA: Diagnosis not present

## 2021-03-02 DIAGNOSIS — F3110 Bipolar disorder, current episode manic without psychotic features, unspecified: Secondary | ICD-10-CM | POA: Insufficient documentation

## 2021-03-02 DIAGNOSIS — G459 Transient cerebral ischemic attack, unspecified: Secondary | ICD-10-CM | POA: Diagnosis not present

## 2021-03-02 DIAGNOSIS — Z8546 Personal history of malignant neoplasm of prostate: Secondary | ICD-10-CM | POA: Insufficient documentation

## 2021-03-02 DIAGNOSIS — Z85821 Personal history of Merkel cell carcinoma: Secondary | ICD-10-CM | POA: Insufficient documentation

## 2021-03-02 DIAGNOSIS — Z7982 Long term (current) use of aspirin: Secondary | ICD-10-CM | POA: Insufficient documentation

## 2021-03-02 DIAGNOSIS — F3131 Bipolar disorder, current episode depressed, mild: Secondary | ICD-10-CM | POA: Insufficient documentation

## 2021-03-02 DIAGNOSIS — F319 Bipolar disorder, unspecified: Secondary | ICD-10-CM

## 2021-03-02 DIAGNOSIS — I2510 Atherosclerotic heart disease of native coronary artery without angina pectoris: Secondary | ICD-10-CM | POA: Diagnosis not present

## 2021-03-02 DIAGNOSIS — Z20822 Contact with and (suspected) exposure to covid-19: Secondary | ICD-10-CM | POA: Diagnosis not present

## 2021-03-02 DIAGNOSIS — I10 Essential (primary) hypertension: Secondary | ICD-10-CM | POA: Diagnosis not present

## 2021-03-02 DIAGNOSIS — Z950 Presence of cardiac pacemaker: Secondary | ICD-10-CM | POA: Insufficient documentation

## 2021-03-02 DIAGNOSIS — F3010 Manic episode without psychotic symptoms, unspecified: Secondary | ICD-10-CM

## 2021-03-02 LAB — RESP PANEL BY RT-PCR (FLU A&B, COVID) ARPGX2
Influenza A by PCR: NEGATIVE
Influenza B by PCR: NEGATIVE
SARS Coronavirus 2 by RT PCR: NEGATIVE

## 2021-03-02 LAB — URINALYSIS, ROUTINE W REFLEX MICROSCOPIC
Bilirubin Urine: NEGATIVE
Glucose, UA: NEGATIVE mg/dL
Hgb urine dipstick: NEGATIVE
Ketones, ur: NEGATIVE mg/dL
Leukocytes,Ua: NEGATIVE
Nitrite: NEGATIVE
Protein, ur: NEGATIVE mg/dL
Specific Gravity, Urine: 1.006 (ref 1.005–1.030)
pH: 7 (ref 5.0–8.0)

## 2021-03-02 LAB — CBC WITH DIFFERENTIAL/PLATELET
Abs Immature Granulocytes: 0.04 10*3/uL (ref 0.00–0.07)
Basophils Absolute: 0.1 10*3/uL (ref 0.0–0.1)
Basophils Relative: 1 %
Eosinophils Absolute: 0.2 10*3/uL (ref 0.0–0.5)
Eosinophils Relative: 2 %
HCT: 32.1 % — ABNORMAL LOW (ref 39.0–52.0)
Hemoglobin: 11.3 g/dL — ABNORMAL LOW (ref 13.0–17.0)
Immature Granulocytes: 0 %
Lymphocytes Relative: 25 %
Lymphs Abs: 2.4 10*3/uL (ref 0.7–4.0)
MCH: 32.9 pg (ref 26.0–34.0)
MCHC: 35.2 g/dL (ref 30.0–36.0)
MCV: 93.6 fL (ref 80.0–100.0)
Monocytes Absolute: 1.2 10*3/uL — ABNORMAL HIGH (ref 0.1–1.0)
Monocytes Relative: 13 %
Neutro Abs: 5.6 10*3/uL (ref 1.7–7.7)
Neutrophils Relative %: 59 %
Platelets: 346 10*3/uL (ref 150–400)
RBC: 3.43 MIL/uL — ABNORMAL LOW (ref 4.22–5.81)
RDW: 12.7 % (ref 11.5–15.5)
WBC: 9.5 10*3/uL (ref 4.0–10.5)
nRBC: 0 % (ref 0.0–0.2)

## 2021-03-02 LAB — RAPID URINE DRUG SCREEN, HOSP PERFORMED
Amphetamines: NOT DETECTED
Barbiturates: NOT DETECTED
Benzodiazepines: NOT DETECTED
Cocaine: NOT DETECTED
Opiates: NOT DETECTED
Tetrahydrocannabinol: NOT DETECTED

## 2021-03-02 LAB — SALICYLATE LEVEL: Salicylate Lvl: <7 mg/dL — ABNORMAL LOW (ref 7.0–30.0)

## 2021-03-02 LAB — COMPREHENSIVE METABOLIC PANEL
ALT: 31 U/L (ref 0–44)
AST: 26 U/L (ref 15–41)
Albumin: 3.5 g/dL (ref 3.5–5.0)
Alkaline Phosphatase: 63 U/L (ref 38–126)
Anion gap: 9 (ref 5–15)
BUN: 18 mg/dL (ref 8–23)
CO2: 23 mmol/L (ref 22–32)
Calcium: 8.8 mg/dL — ABNORMAL LOW (ref 8.9–10.3)
Chloride: 100 mmol/L (ref 98–111)
Creatinine, Ser: 0.73 mg/dL (ref 0.61–1.24)
GFR, Estimated: >60 mL/min (ref >60)
Glucose, Bld: 135 mg/dL — ABNORMAL HIGH (ref 70–99)
Potassium: 3.8 mmol/L (ref 3.5–5.1)
Sodium: 132 mmol/L — ABNORMAL LOW (ref 135–145)
Total Bilirubin: 0.6 mg/dL (ref 0.3–1.2)
Total Protein: 6.8 g/dL (ref 6.5–8.1)

## 2021-03-02 LAB — ACETAMINOPHEN LEVEL: Acetaminophen (Tylenol), Serum: <10 ug/mL — ABNORMAL LOW (ref 10–30)

## 2021-03-02 MED ORDER — LORAZEPAM 2 MG/ML IJ SOLN: 0.5000 mg | Freq: Once | INTRAMUSCULAR | Status: DC

## 2021-03-02 MED ORDER — GABAPENTIN 300 MG PO CAPS
300.0000 mg | ORAL_CAPSULE | Freq: Two times a day (BID) | ORAL | Status: DC
  Administered 2021-03-02 – 2021-03-03 (×2): 300 mg via ORAL
  Filled 2021-03-02 (×2): qty 1

## 2021-03-02 MED ORDER — QUETIAPINE FUMARATE 200 MG PO TABS
300.0000 mg | ORAL_TABLET | Freq: Every day | ORAL | Status: DC
  Administered 2021-03-02: 300 mg via ORAL
  Filled 2021-03-02: qty 1

## 2021-03-02 MED ORDER — LORAZEPAM 1 MG PO TABS
1.0000 mg | ORAL_TABLET | Freq: Once | ORAL | Status: AC
  Administered 2021-03-02: 1 mg via ORAL
  Filled 2021-03-02: qty 1

## 2021-03-02 NOTE — Discharge Instructions (Signed)
Take all medications as prescribed. Keep all follow-up appointments as scheduled.  Do not consume alcohol or use illegal drugs while on prescription medications. Report any adverse effects from your medications to your primary care provider promptly.  In the event of recurrent symptoms or worsening symptoms, call 911, a crisis hotline, or go to the nearest emergency department for evaluation.   

## 2021-03-02 NOTE — ED Provider Notes (Addendum)
Behavioral Health Urgent Care Medical Screening Exam  Patient Name: Douglas Perkins. MRN: [id number] Date of Evaluation: 03/02/21 Chief Complaint:   Diagnosis:  Final diagnoses:  Bipolar I disorder ([place])    History of Present illness: Douglas Perkins. is a 75 y.o. male.  Presents to [facility] urgent care accompanied by emergency medical services reporting mania.  Was reported [facility].  He denied suicidal or homicidal ideations.  Denies auditory or visual hallucinations. Patient presents hyperverbal and pressured throughout this assessment.  NP spoke to patient's wife for additional collateral who reports patient is mainly followed by [facility] ( [place]).  States he was recently evaluated at [facility] health due to decreased sodium.  She reported that all of his psychiatric medications was discontinued at that time.  States patient is prescribed Depakote 750 mg nightly, gabapentin 300 mg twice daily and Seroquel 300 mg nightly.  States he recently got approved to restart his valproic acid by the psychiatrist at the [place]. stated patient may have only had 1 or 2 doses since approval.  Per RN staff who reported patients to acute for [facility] urgent care requiring assistance with ambulation.  NP contacted EDP to tranfers care. Will follow-up with CSW for inpatient admission. Patient to restart home medication where appropriate.   Psychiatric Specialty Exam  Presentation  General Appearance:Appropriate for Environment; Casual  Eye Contact:Fair  Speech:Clear and Coherent; Normal Rate  Speech Volume:Normal  Handedness:No data recorded  Mood and Affect  Mood:Euthymic  Affect:Appropriate; Congruent   Thought Process  Thought Processes:Coherent; Goal Directed; Linear  Descriptions of Associations:Intact  Orientation:Full (Time, Place and Person)  Thought Content:Logical; WDL    Hallucinations:None  Ideas of  Reference:None  Suicidal Thoughts:No  Homicidal Thoughts:No   Sensorium  Memory:Immediate Good; Remote Fair; Recent Fair  Judgment:Intact  Insight:Present   Executive Functions  Concentration:Fair  Attention Span:Fair  [place]   Psychomotor Activity  Psychomotor Activity:Normal   Assets  Assets:Communication Skills; Desire for Improvement; Resilience   Sleep  Sleep:Fair  Number of hours: No data recorded  No data recorded  Physical Exam: Physical Exam Vitals reviewed.  Cardiovascular:     Rate and Rhythm: Normal rate.  Pulmonary:     Effort: Pulmonary effort is normal.  Neurological:     Mental Status: He is alert and oriented to person, place, and time.  Psychiatric:        Attention and Perception: Attention normal.        Mood and Affect: Mood is anxious. Affect is labile.        Speech: Speech normal.        Behavior: Behavior is agitated and hyperactive. Behavior is cooperative.        Thought Content: Thought content normal.        Cognition and Memory: Cognition normal.   Review of Systems  Cardiovascular: Negative.   Gastrointestinal: Negative.   Psychiatric/Behavioral:  Negative for depression, hallucinations and suicidal ideas. The patient is nervous/anxious.   All other systems reviewed and are negative. Blood pressure (!) 148/86, pulse 82, temperature 98 F (36.7 C), resp. rate 16, SpO2 97 %. There is no height or weight on file to calculate BMI.  Musculoskeletal: Strength & Muscle Tone: within normal limits Gait & Station: normal Patient leans: N/A   [facility] MSE Discharge Disposition for Follow up and Recommendations: Based on my evaluation I certify that psychiatric inpatient services furnished can reasonably be  expected to improve the patient's condition which I recommend transfer to an appropriate accepting facility.     Derrill Center, NP 03/02/2021, 2:48 PM

## 2021-03-02 NOTE — ED Notes (Signed)
Belongings placed in locker 4

## 2021-03-02 NOTE — ED Provider Notes (Signed)
[facility] EMERGENCY DEPARTMENT Provider Note   CSN: [id number] Arrival date & time: 03/02/21  1546     History Chief Complaint  Patient presents with   Manic Behavior    Douglas Perkins. is a 75 y.o. male.  HPI  Patient is a 75 year old male with a history of bipolar 1 disorder, hypertension, Merkel cell cancer, TIA, who presents to the emergency department due to a manic episode.  Denies any SI/HI/auditory or visual hallucinations.  Note from behavioral health urgent care as below.  Patient denies any physical complaints at this time.  States that he feels that he is having a manic episode and has not been on his recent medications.  Patient appears manic as well as tangential.  History of Present illness: Douglas Perkins. is a 75 y.o. male.  Presents to [facility] urgent care accompanied by emergency medical services reporting mania.  Was [facility].  He denied suicidal or homicidal ideations.  Denies auditory or visual hallucinations. Patient presents hyperverbal and pressured throughout this assessment.   NP spoke to patient's wife for additional collateral who reports patient is mainly followed by [facility] ( [place]).  States he was recently evaluated at [facility] health due to decreased sodium.  She reported that all of his psychiatric medications was discontinued at that time.  States patient is prescribed Depakote 750 mg nightly, gabapentin 300 mg twice daily and Seroquel 300 mg nightly.  States he recently got approved to restart his valproic acid by the psychiatrist at the [place]. stated patient may have only had 1 or 2 doses since approval.   Per RN staff who reported patients to acute for [facility] urgent care requiring assistance with ambulation.  NP contacted EDP to tranfers care. Will follow-up with CSW for inpatient admission. Patient to restart home medication where appropriate.      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[place])    Mural thrombus of cardiac apex    Presence of permanent cardiac pacemaker 2017   SA node dysfunction   Sleep apnea    does not use CPAP   TIA (transient ischemic attack)     Patient Active Problem List   Diagnosis Date Noted   Pressure injury of skin 27/25/3664   Acute metabolic encephalopathy 40/34/7425   Hyponatremia syndrome 02/18/2021   OSA (obstructive sleep apnea) 11/26/2019   Cardiac pacemaker 04/30/2019   History of sinoatrial node dysfunction 04/30/2019   Other male erectile dysfunction 11/12/2018   Near syncope 05/22/2017   Coronary artery disease involving native coronary artery of native heart 01/10/2017   Low serum testosterone level 06/16/2016   Obesity (BMI 30-39.9) 03/09/2016   Syncope 07/19/2015   Sepsis ([place]) 07/07/2015   Bipolar I disorder ([place]) 06/24/2015   Bilateral pneumonia 06/23/2015   Bipolar affective disorder, currently manic, mild ([place]) 06/18/2015   History of duodenal ulcer 06/18/2015   Vaccine counseling 06/18/2015   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[place]) 06/07/2015   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   Bradycardia 02/08/2015   Hyperlipidemia, mixed 10/14/2014   AF (amaurosis fugax) 09/16/2014   Benign essential hypertension 09/16/2014   Chest pain 09/16/2014   Skin cyst 08/06/2012   Personal history of lymphoma 08/06/2012   Cancer [facility])     Past Surgical History:  Procedure Laterality Date   CHOLECYSTECTOMY     COLONOSCOPY WITH PROPOFOL N/A 01/31/2017   Procedure: COLONOSCOPY WITH PROPOFOL;  Surgeon: Manya Silvas, MD;  Location: [facility] ENDOSCOPY;  Service: Endoscopy;  Laterality: N/A;   DRUG INDUCED ENDOSCOPY N/A 05/12/2020   Procedure: DRUG INDUCED ENDOSCOPY;  Surgeon: Melida Quitter, MD;  Location: [place];  Service: ENT;  Laterality: N/A;   IMPLANTATION OF HYPOGLOSSAL  NERVE STIMULATOR N/A 07/21/2020   Procedure: IMPLANTATION OF HYPOGLOSSAL NERVE STIMULATOR;  Surgeon: Melida Quitter, MD;  Location: [place];  Service: ENT;  Laterality: N/A;   LEG SURGERY Left    distal   Pace maker placement     PROSTATE SURGERY     PROSTATECTOMY     SKIN CANCER EXCISION  2006,2007   Merkle Cell Carcinoma       Family History  Problem Relation Age of Onset   Stroke Father    CAD Paternal Grandmother    CAD Paternal Grandfather     Social History   Tobacco Use   Smoking status: Never   Smokeless tobacco: Never  Substance Use Topics   Alcohol use: No    Alcohol/week: 0.0 standard drinks   Drug use: No    Home Medications Prior to Admission medications   Medication Sig Start Date End Date Taking? Authorizing Provider  amLODipine (NORVASC) 10 MG tablet Take 10 mg by mouth daily. 01/10/17   [provider]  aspirin EC 81 MG tablet Take 81 mg by mouth daily. Reported on 08/18/2015    [provider]  atorvastatin (LIPITOR) 40 MG tablet Take 1 tablet by mouth 1 day or 1 dose. 01/08/18   [provider]  bisacodyl (DULCOLAX) 5 MG EC tablet Take 1 tablet (5 mg total) by mouth daily as needed for moderate constipation. 02/22/21   Thurnell Lose, MD  carvedilol (COREG) 25 MG tablet Take 25 mg by mouth in the morning and at bedtime. 07/11/19 02/18/21  [provider]  gabapentin (NEURONTIN) 300 MG capsule Take 300 mg by mouth 2 (two) times daily.    [provider]  hydrOXYzine (ATARAX/VISTARIL) 50 MG tablet Take 100 mg by mouth at bedtime.    [provider]  latanoprost (XALATAN) 0.005 % ophthalmic solution Place 1 drop into the right eye 2 (two) times daily.    [provider]  Multiple Vitamin (MULTIVITAMIN WITH MINERALS) TABS tablet Take 1 tablet by mouth daily.    [provider]  Omega-3 Fatty Acids (FISH OIL) 1000 MG CAPS Take 1,000 mg by mouth 2 (two) times daily.      [provider]  pantoprazole (PROTONIX) 40 MG tablet Take 40 mg by mouth 2 (two) times daily.    [provider]  QUEtiapine (SEROQUEL) 300 MG tablet Take 300 mg by mouth at bedtime.    [provider]    Allergies    Other, Feldene [piroxicam], and Sulfa antibiotics  Review of Systems   Review of Systems  All other systems reviewed and are negative. Ten systems reviewed and are negative for acute change, except as noted in the HPI.   Physical Exam Updated Vital Signs BP (!) 149/76 (BP Location: Right Arm)   Pulse 96   Temp 98.4 F (36.9 C) (Oral)   Resp 18   Ht 6' (1.829 m)   Wt 100 kg   SpO2 100%   BMI 29.90 kg/m   Physical Exam Vitals and nursing note reviewed.  Constitutional:  General: He is not in acute distress.    Appearance: Normal appearance. He is not ill-appearing, toxic-appearing or diaphoretic.  HENT:     Head: Normocephalic and atraumatic.     Right Ear: External ear normal.     Left Ear: External ear normal.     Nose: Nose normal.     Mouth/Throat:     Mouth: Mucous membranes are moist.     Pharynx: Oropharynx is clear. No oropharyngeal exudate or posterior oropharyngeal erythema.  Eyes:     General: No scleral icterus.       Right eye: No discharge.        Left eye: No discharge.     Extraocular Movements: Extraocular movements intact.     Conjunctiva/sclera: Conjunctivae normal.  Cardiovascular:     Rate and Rhythm: Normal rate and regular rhythm.     Pulses: Normal pulses.     Heart sounds: Normal heart sounds. No murmur heard.   No friction rub. No gallop.  Pulmonary:     Effort: Pulmonary effort is normal. No respiratory distress.     Breath sounds: Normal breath sounds. No stridor. No wheezing, rhonchi or rales.  Abdominal:     General: Abdomen is flat.     Palpations: Abdomen is soft.     Tenderness: There is no abdominal tenderness.  Musculoskeletal:        General: Normal range of motion.     Cervical  back: Normal range of motion and neck supple. No tenderness.  Skin:    General: Skin is warm and dry.  Neurological:     General: No focal deficit present.     Mental Status: He is alert and oriented to person, place, and time.     Comments: A&O x3.  Moving all 4 extremities with ease.  Ambulatory with a steady gait.  No gross deficits.  Psychiatric:        Mood and Affect: Mood is anxious.        Speech: Speech is rapid and pressured and tangential.        Behavior: Behavior is hyperactive. Behavior is not agitated, aggressive or combative.        Thought Content: Thought content is not paranoid. Thought content does not include homicidal or suicidal ideation.   ED Results / Procedures / Treatments   Labs (all labs ordered are listed, but only abnormal results are displayed) Labs Reviewed  COMPREHENSIVE METABOLIC PANEL - Abnormal; Notable for the following components:      Result Value   Sodium 132 (*)    Glucose, Bld 135 (*)    Calcium 8.8 (*)    All other components within normal limits  CBC WITH DIFFERENTIAL/PLATELET - Abnormal; Notable for the following components:   RBC 3.43 (*)    Hemoglobin 11.3 (*)    HCT 32.1 (*)    Monocytes Absolute 1.2 (*)    All other components within normal limits  SALICYLATE LEVEL - Abnormal; Notable for the following components:   Salicylate Lvl <0.9 (*)    All other components within normal limits  ACETAMINOPHEN LEVEL - Abnormal; Notable for the following components:   Acetaminophen (Tylenol), Serum <10 (*)    All other components within normal limits  URINALYSIS, ROUTINE W REFLEX MICROSCOPIC - Abnormal; Notable for the following components:   Color, Urine STRAW (*)    All other components within normal limits  RESP PANEL BY RT-PCR (FLU A&B, COVID) ARPGX2  RAPID URINE DRUG SCREEN, HOSP PERFORMED  EKG None  Radiology CT HEAD WO CONTRAST (5MM)  Result Date: 03/02/2021 CLINICAL DATA:  Delirium EXAM: CT HEAD WITHOUT CONTRAST TECHNIQUE:  Contiguous axial images were obtained from the base of the skull through the vertex without intravenous contrast. COMPARISON:  CT head February 18, 2021. FINDINGS: Brain: Similar appearance of chronic bilateral occipital infarcts. Similar additional moderate patchy and confluent white matter hypodensities, nonspecific but compatible with chronic microvascular ischemic disease. No definite acute large vascular territory infarct. Streak artifact limits evaluation of the posterior fossa. No evidence of acute hemorrhage, mass lesion, midline shift, or extra-axial fluid collection. Vascular: No hyperdense vessel identified. Calcific intracranial atherosclerosis. Skull: No evidence of acute fracture. Sinuses/Orbits: Clear sinuses.  Unremarkable orbits. Other: No mastoid effusions. IMPRESSION: 1. No evidence of acute intracranial abnormality. MRI could provide more sensitive evaluation for acute infarct if clinically indicated. 2. 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 Electronically Signed   By: Margaretha Sheffield M.D.   On: 03/02/2021 17:44    Procedures Procedures   Medications Ordered in ED Medications  LORazepam (ATIVAN) tablet 1 mg (1 mg Oral Given 03/02/21 1724)    ED Course  I have reviewed the triage vital signs and the nursing notes.  Pertinent labs & imaging results that were available during my care of the patient were reviewed by me and considered in my medical decision making (see chart for details).    MDM Rules/Calculators/A&P                          Pt is a 75 y.o. male with a history of bipolar 1 disorder who presents to the emergency department from the behavioral health urgent care for medical clearance and inpatient placement.  Labs: CBC with RBCs of 3.43, hemoglobin of 11.3, hematocrit of 32.1, monocytes 1.2. CMP with a sodium of 132, glucose of 135, calcium of 8.8. UA is negative. UDS is negative. Salicylate less than  7. Acetaminophen less than 10. Respiratory panel is negative.  I, Rayna Sexton, PA-C, personally reviewed and evaluated these images and lab results as part of my medical decision-making.  Patient with a history of bipolar 1 disorder who presents to the emergency department exhibiting manic behavior.  Patient extremely tangential on my exam but A&O x3.  Moving all 4 extremities with no gross deficits.  Lab work is reassuring.  Sodium today is 132.  CT scan of his head shows no acute intracranial abnormalities.  Feel the patient is medically cleared at this time.  We will reconsult TTS for further recommendations and patient disposition.  Note: Portions of this report may have been transcribed using voice recognition software. Every effort was made to ensure accuracy; however, inadvertent computerized transcription errors may be present.   Final Clinical Impression(s) / ED Diagnoses Final diagnoses:  Manic behavior ([place])  Bipolar 1 disorder [facility])   Rx / DC Orders ED Discharge Orders     None        Rayna Sexton, PA-C 03/02/21 2048    Wyvonnia Dusky, MD 03/03/21 1002

## 2021-03-02 NOTE — BH Assessment (Addendum)
Pt here from ALF. Per EMS concerns of manic behaviors. Pt denies SI, HI, AVH. Pt is urgent

## 2021-03-02 NOTE — ED Notes (Signed)
Pt not allowing the tech to do the EKG or Covid swab at the time. He's behavior is quite erratic and manic at this time. Will try again after a little while has passed and pt is calm.

## 2021-03-02 NOTE — BH Assessment (Addendum)
Pt requested for TTS to call his wife to inform her of his location and that he was ok. TTS talked with Manus Gunning and updated her on pt arrival to [facility]. Manus Gunning reports that pt lives in a senior living facility and today he had an episode where he did not want to go back into the facility and was "talking wild". Manus Gunning reports that pt was recently at the ED and was taken off all his psych medications which she reports is the reason patient is having issues.

## 2021-03-02 NOTE — ED Notes (Addendum)
Patient will be transported and escorted to [facility] via EMS-non emergency. Patient is unsteady on his feet and in a manic state. Writer spoke to [place] in ED.

## 2021-03-02 NOTE — ED Triage Notes (Signed)
Patient brought in via ems from [facility]. Patient has manic behavior, denies SI/HI. Patient states this hospital took him off his meds 2 weeks ago and has been manic. H of bipolar.

## 2021-03-02 NOTE — ED Notes (Signed)
Patient was transferred to [facility] via EMS. Writer called ED and spoke with Douglas Perkins prior to transfer.

## 2021-03-03 ENCOUNTER — Inpatient Hospital Stay
Admission: AD | Admit: 2021-03-03 | Discharge: 2021-03-09 | DRG: 885 | Disposition: A | Discharge status: Skilled Nursing Facility | Payer: Medicare Other | Source: Intra-hospital | Attending: Psychiatry | Admitting: Psychiatry

## 2021-03-03 ENCOUNTER — Encounter: Payer: Self-pay | Admitting: Psychiatry

## 2021-03-03 ENCOUNTER — Telehealth (HOSPITAL_COMMUNITY): Payer: Self-pay | Admitting: Family Medicine

## 2021-03-03 DIAGNOSIS — G4730 Sleep apnea, unspecified: Secondary | ICD-10-CM | POA: Diagnosis present

## 2021-03-03 DIAGNOSIS — Z8673 Personal history of transient ischemic attack (TIA), and cerebral infarction without residual deficits: Secondary | ICD-10-CM

## 2021-03-03 DIAGNOSIS — G4700 Insomnia, unspecified: Secondary | ICD-10-CM | POA: Diagnosis present

## 2021-03-03 DIAGNOSIS — E871 Hypo-osmolality and hyponatremia: Secondary | ICD-10-CM | POA: Diagnosis present

## 2021-03-03 DIAGNOSIS — F3110 Bipolar disorder, current episode manic without psychotic features, unspecified: Principal | ICD-10-CM | POA: Diagnosis present

## 2021-03-03 DIAGNOSIS — Z9049 Acquired absence of other specified parts of digestive tract: Secondary | ICD-10-CM | POA: Diagnosis not present

## 2021-03-03 DIAGNOSIS — I10 Essential (primary) hypertension: Secondary | ICD-10-CM | POA: Diagnosis present

## 2021-03-03 DIAGNOSIS — Z882 Allergy status to sulfonamides status: Secondary | ICD-10-CM | POA: Diagnosis not present

## 2021-03-03 DIAGNOSIS — H409 Unspecified glaucoma: Secondary | ICD-10-CM | POA: Diagnosis present

## 2021-03-03 DIAGNOSIS — Z7982 Long term (current) use of aspirin: Secondary | ICD-10-CM | POA: Diagnosis not present

## 2021-03-03 DIAGNOSIS — Z85821 Personal history of Merkel cell carcinoma: Secondary | ICD-10-CM

## 2021-03-03 DIAGNOSIS — Z888 Allergy status to other drugs, medicaments and biological substances status: Secondary | ICD-10-CM

## 2021-03-03 DIAGNOSIS — E782 Mixed hyperlipidemia: Secondary | ICD-10-CM | POA: Diagnosis present

## 2021-03-03 DIAGNOSIS — R7303 Prediabetes: Secondary | ICD-10-CM | POA: Diagnosis present

## 2021-03-03 DIAGNOSIS — Z8546 Personal history of malignant neoplasm of prostate: Secondary | ICD-10-CM

## 2021-03-03 DIAGNOSIS — Z20822 Contact with and (suspected) exposure to covid-19: Secondary | ICD-10-CM | POA: Diagnosis present

## 2021-03-03 DIAGNOSIS — I513 Intracardiac thrombosis, not elsewhere classified: Secondary | ICD-10-CM | POA: Diagnosis present

## 2021-03-03 DIAGNOSIS — Z950 Presence of cardiac pacemaker: Secondary | ICD-10-CM | POA: Diagnosis not present

## 2021-03-03 DIAGNOSIS — Z79899 Other long term (current) drug therapy: Secondary | ICD-10-CM

## 2021-03-03 DIAGNOSIS — F3112 Bipolar disorder, current episode manic without psychotic features, moderate: Secondary | ICD-10-CM | POA: Diagnosis not present

## 2021-03-03 MED ORDER — PANTOPRAZOLE SODIUM 40 MG PO TBEC
40.0000 mg | DELAYED_RELEASE_TABLET | Freq: Two times a day (BID) | ORAL | Status: DC
  Administered 2021-03-03 – 2021-03-09 (×12): 40 mg via ORAL
  Filled 2021-03-03 (×12): qty 1

## 2021-03-03 MED ORDER — QUETIAPINE FUMARATE 100 MG PO TABS
300.0000 mg | ORAL_TABLET | Freq: Every day | ORAL | Status: DC
  Administered 2021-03-03 – 2021-03-08 (×6): 300 mg via ORAL
  Filled 2021-03-03 (×6): qty 3

## 2021-03-03 MED ORDER — ALUM & MAG HYDROXIDE-SIMETH 200-200-20 MG/5ML PO SUSP: 30.0000 mL | ORAL | Status: DC | PRN

## 2021-03-03 MED ORDER — LATANOPROST 0.005 % OP SOLN
1.0000 [drp] | Freq: Two times a day (BID) | OPHTHALMIC | Status: DC
  Administered 2021-03-03 – 2021-03-09 (×12): 1 [drp] via OPHTHALMIC
  Filled 2021-03-03: qty 2.5

## 2021-03-03 MED ORDER — ACETAMINOPHEN 325 MG PO TABS: 650.0000 mg | ORAL_TABLET | Freq: Four times a day (QID) | ORAL | Status: DC | PRN

## 2021-03-03 MED ORDER — LORAZEPAM 1 MG PO TABS
1.0000 mg | ORAL_TABLET | Freq: Four times a day (QID) | ORAL | Status: DC | PRN
  Administered 2021-03-03 – 2021-03-08 (×9): 1 mg via ORAL
  Filled 2021-03-03 (×10): qty 1

## 2021-03-03 MED ORDER — GABAPENTIN 100 MG PO CAPS
300.0000 mg | ORAL_CAPSULE | Freq: Two times a day (BID) | ORAL | Status: DC
  Administered 2021-03-03 – 2021-03-04 (×3): 300 mg via ORAL
  Filled 2021-03-03 (×2): qty 3

## 2021-03-03 MED ORDER — MAGNESIUM HYDROXIDE 400 MG/5ML PO SUSP: 30.0000 mL | Freq: Every day | ORAL | Status: DC | PRN

## 2021-03-03 MED ORDER — AMLODIPINE BESYLATE 5 MG PO TABS
10.0000 mg | ORAL_TABLET | Freq: Every day | ORAL | Status: DC
  Administered 2021-03-03 – 2021-03-09 (×7): 10 mg via ORAL
  Filled 2021-03-03 (×7): qty 2

## 2021-03-03 MED ORDER — GABAPENTIN 100 MG PO CAPS
300.0000 mg | ORAL_CAPSULE | Freq: Two times a day (BID) | ORAL | Status: DC
  Administered 2021-03-04 – 2021-03-09 (×10): 300 mg via ORAL
  Filled 2021-03-03 (×12): qty 3

## 2021-03-03 MED ORDER — DIVALPROEX SODIUM 250 MG PO DR TAB
750.0000 mg | DELAYED_RELEASE_TABLET | Freq: Every day | ORAL | Status: DC
  Administered 2021-03-03 – 2021-03-07 (×5): 750 mg via ORAL
  Filled 2021-03-03 (×5): qty 3

## 2021-03-03 MED ORDER — BISACODYL 5 MG PO TBEC: 5.0000 mg | DELAYED_RELEASE_TABLET | Freq: Every day | ORAL | Status: DC | PRN

## 2021-03-03 MED ORDER — ATORVASTATIN CALCIUM 10 MG PO TABS
40.0000 mg | ORAL_TABLET | ORAL | Status: DC
  Administered 2021-03-03 – 2021-03-05 (×3): 40 mg via ORAL
  Filled 2021-03-03 (×3): qty 4

## 2021-03-03 NOTE — BH Assessment (Signed)
Care Management - Follow Up [facility] Discharges   Writer attempted to make contact with patient today and was unsuccessful.  Writer left a HIPPA compliant voice message.   Per chart review, patient will follow up with his established psychiatrist at [facility]

## 2021-03-03 NOTE — Progress Notes (Signed)
75 year old male admitted to the unit from [facility] ED accompanied by staff with the admitting DX: of bipolar 1. Patient is alert, agitated and uncooperative. Refused to surrender belongings to staff or security personnel despite encouragement. Call place to patient wife who talk to patient and finally convince patient to cooperate with staff. Patient is high risk for fall due to unsteady gait and weakness. Ambulate with front wheel walker.   Body assessment done with 2nd RN Estill Bamberg present. Patient noted with scab to right shoulder, scratch mark to left arm and right arm  Oriented to the unit and room. Orientation also provided on call light.   Ate supper in day room among staff and peers with good appetite.  Patient needs constant redirection,hit call bell multiple times but did not understand why he was calling. Compliant with medications and PRN Ativan that was administered for agitation. Patient went back to his room after supper and ring call bell. Staff went in room to respond to call and  patient observed to be naked and sitting at the edge of the bed. Patient require 1 person assist with toileting. Safety checks in place, patient remain safe in the unit.

## 2021-03-03 NOTE — BH Assessment (Signed)
[place]

## 2021-03-03 NOTE — H&P (Signed)
Psychiatric Admission Assessment Adult  Patient Identification: Douglas Perkins. MRN:  [id number] Date of Evaluation:  03/03/2021 Chief Complaint:  Bipolar affective disorder, current episode manic ([place]) [F31.10] Principal Diagnosis: Bipolar affective disorder, current episode manic ([place]) Diagnosis:  Principal Problem:   Bipolar affective disorder, current episode manic ([place]) Active Problems:   Benign essential hypertension   Hyperlipidemia, mixed   Hyponatremia syndrome  History of Present Illness: Patient seen and chart reviewed.  75 year old man with a history of bipolar disorder possibly bipolar 2.  Patient presented to the hospital in [place] with acute manic symptoms with racing thoughts hyperactivity hyperverbal somewhat agitated possibly with some behaviors that is living facility that were not quite acceptable.  Patient was recently taken off of his Depakote after appearing at the hospital with a sodium of 117.  He has now been off of his Depakote for several days.  Sodium is back up closer to his baseline which is high 20s low 30s but the patient is displaying manic symptoms.  Has still been taking Seroquel.  Patient is pleasant and insightful but has racing thoughts flight of ideas and some grandiosity.  Denies suicidal or homicidal thoughts.  Denies having hallucinations.  Spoke to his wife as well who is extremely familiar with his condition.  It is the opinion of his wife and the patient that he should be restarted on Depakote despite the recent hyponatremia as long as we are monitoring it closely because that has worked so well for his mania in the past. Associated Signs/Symptoms: Depression Symptoms:  insomnia, Duration of Depression Symptoms: No data recorded (Hypo) Manic Symptoms:  Distractibility, Elevated Mood, Flight of Ideas, Grandiosity, Impulsivity, Labiality of Mood, Anxiety Symptoms:   None reported Psychotic Symptoms:   None reported PTSD  Symptoms: Negative Total Time spent with patient: 1 hour  Past Psychiatric History: Patient has a history of bipolar disorder and has been on medication for many years.  He was stable on Seroquel and Tegretol for a long time but had an inpatient hospitalization at the [place] earlier this year at which time Tegretol was discontinued and replaced with valproic acid.  This was along with his Seroquel.  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  No history of suicidal behavior no history of violence.  Is the patient at risk to self? No.  Has the patient been a risk to self in the past 6 months? No.  Has the patient been a risk to self within the distant past? No.  Is the patient a risk to others? Yes.    Has the patient been a risk to others in the past 6 months? Yes.    Has the patient been a risk to others within the distant past? Yes.     Prior Inpatient Therapy:   Prior Outpatient Therapy:    Alcohol Screening: 1. How often do you have a drink containing alcohol?: Never 2. How many drinks containing alcohol do you have on a typical day when you are drinking?: 1 or 2 3. How often do you have six or more drinks on one occasion?: Never AUDIT-C Score: 0 4. How often during the last year have you found that you were not able to stop drinking once you had started?: Never 5. How often during the last year have you failed to do what was normally expected from you because of drinking?: Never  6. How often during the last year have you needed a first drink in the morning to get yourself going after a heavy drinking session?: Never 7. How often during the last year have you had a feeling of guilt of remorse after drinking?: Never 8. How often during the last year have you been unable to remember what happened the night before because you had been drinking?: Never 9. Have you or someone else been injured as a  result of your drinking?: No 10. Has a relative or friend or a doctor or another health worker been concerned about your drinking or suggested you cut down?: No Alcohol Use Disorder Identification Test Final Score (AUDIT): 0 Substance Abuse History in the last 12 months:  No. 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[place])    Mural thrombus of cardiac apex    Presence of permanent cardiac pacemaker 2017   SA node dysfunction   Sleep apnea    does not use CPAP   TIA (transient ischemic attack)     Past Surgical History:  Procedure Laterality Date   CHOLECYSTECTOMY     COLONOSCOPY WITH PROPOFOL N/A 01/31/2017   Procedure: COLONOSCOPY WITH PROPOFOL;  Surgeon: Manya Silvas, MD;  Location: [facility] ENDOSCOPY;  Service: Endoscopy;  Laterality: N/A;   DRUG INDUCED ENDOSCOPY N/A 05/12/2020   Procedure: DRUG INDUCED ENDOSCOPY;  Surgeon: Melida Quitter, MD;  Location: [place];  Service: ENT;  Laterality: N/A;   IMPLANTATION OF HYPOGLOSSAL NERVE STIMULATOR N/A 07/21/2020   Procedure: IMPLANTATION OF HYPOGLOSSAL NERVE STIMULATOR;  Surgeon: Melida Quitter, MD;  Location: [place];  Service: ENT;  Laterality: N/A;   LEG SURGERY Left    distal   Pace maker placement     PROSTATE SURGERY     PROSTATECTOMY     SKIN CANCER EXCISION  2006,2007   Merkle Cell Carcinoma   Family History:  Family History  Problem Relation Age of Onset   Stroke Father    CAD Paternal Grandmother    CAD Paternal Grandfather    Family Psychiatric  History: Positive family history of mood symptoms Tobacco Screening:   Social History:  Social History   Substance and Sexual Activity  Alcohol Use No   Alcohol/week: 0.0 standard drinks      Social History   Substance and Sexual Activity  Drug Use No    Additional Social History:                           Allergies:   Allergies  Allergen Reactions   Other Hives, Shortness Of Breath and Other (See Comments)    Pt states that he is allergic to unwashed blood products.   Other reaction(s): Other (See Comments) UNWASHED BLOOD PRODUCTS  Pt states that he is allergic to unwashed blood products.     Feldene [Piroxicam] Hives   Sulfa Antibiotics Hives   Lab Results:  Results for orders placed or performed during the hospital encounter of 03/02/21 (from the past 48 hour(s))  Comprehensive metabolic panel     Status: Abnormal   Collection Time: 03/02/21  4:34 PM  Result Value Ref Range   Sodium 132 (L) 135 -  145 mmol/L   Potassium 3.8 3.5 - 5.1 mmol/L   Chloride 100 98 - 111 mmol/L   CO2 23 22 - 32 mmol/L   Glucose, Bld 135 (H) 70 - 99 mg/dL    Comment: Glucose reference range applies only to samples taken after fasting for at least 8 hours.   BUN 18 8 - 23 mg/dL   Creatinine, Ser 0.73 0.61 - 1.24 mg/dL   Calcium 8.8 (L) 8.9 - 10.3 mg/dL   Total Protein 6.8 6.5 - 8.1 g/dL   Albumin 3.5 3.5 - 5.0 g/dL   AST 26 15 - 41 U/L   ALT 31 0 - 44 U/L   Alkaline Phosphatase 63 38 - 126 U/L   Total Bilirubin 0.6 0.3 - 1.2 mg/dL   GFR, Estimated >60 >60 mL/min    Comment: (NOTE) Calculated using the CKD-EPI Creatinine Equation (2021)    Anion gap 9 5 - 15    Comment: Performed at [facility] [address], [place], [place] [address]  CBC with Differential     Status: Abnormal   Collection Time: 03/02/21  4:34 PM  Result Value Ref Range   WBC 9.5 4.0 - 10.5 K/uL   RBC 3.43 (L) 4.22 - 5.81 MIL/uL   Hemoglobin 11.3 (L) 13.0 - 17.0 g/dL   HCT 32.1 (L) 39.0 - 52.0 %   MCV 93.6 80.0 - 100.0 fL   MCH 32.9 26.0 - 34.0 pg   MCHC 35.2 30.0 - 36.0 g/dL   RDW 12.7 11.5 - 15.5 %   Platelets 346 150 - 400 K/uL   nRBC 0.0 0.0 - 0.2 %   Neutrophils Relative % 59  %   Neutro Abs 5.6 1.7 - 7.7 K/uL   Lymphocytes Relative 25 %   Lymphs Abs 2.4 0.7 - 4.0 K/uL   Monocytes Relative 13 %   Monocytes Absolute 1.2 (H) 0.1 - 1.0 K/uL   Eosinophils Relative 2 %   Eosinophils Absolute 0.2 0.0 - 0.5 K/uL   Basophils Relative 1 %   Basophils Absolute 0.1 0.0 - 0.1 K/uL   Immature Granulocytes 0 %   Abs Immature Granulocytes 0.04 0.00 - 0.07 K/uL    Comment: Performed at [facility], [place] [address], [place], [place] [address]  Salicylate level     Status: Abnormal   Collection Time: 03/02/21  4:34 PM  Result Value Ref Range   Salicylate Lvl <9.9 (L) 7.0 - 30.0 mg/dL    Comment: Performed at [facility] [address] [place], [place] [address]  Acetaminophen level     Status: Abnormal   Collection Time: 03/02/21  4:34 PM  Result Value Ref Range   Acetaminophen (Tylenol), Serum <10 (L) 10 - 30 ug/mL    Comment: (NOTE) Therapeutic concentrations vary significantly. A range of 10-30 ug/mL  may be an effective concentration for many patients. However, some  are best treated at concentrations outside of this range. Acetaminophen concentrations >150 ug/mL at 4 hours after ingestion  and >50 ug/mL at 12 hours after ingestion are often associated with  toxic reactions.  Performed at [facility], [place] [address] [place], [place] [address]   Urine rapid drug screen (hosp performed)     Status: None   Collection Time: 03/02/21  4:34 PM  Result Value Ref Range   Opiates NONE DETECTED NONE DETECTED   Cocaine NONE DETECTED NONE DETECTED   Benzodiazepines NONE DETECTED NONE DETECTED  Amphetamines NONE DETECTED NONE DETECTED   Tetrahydrocannabinol NONE DETECTED NONE DETECTED   Barbiturates NONE DETECTED NONE DETECTED    Comment: (NOTE) DRUG SCREEN FOR MEDICAL PURPOSES ONLY.  IF CONFIRMATION IS NEEDED FOR ANY PURPOSE, NOTIFY LAB WITHIN 5 DAYS.  LOWEST DETECTABLE LIMITS FOR URINE DRUG SCREEN Drug Class                     Cutoff  (ng/mL) Amphetamine and metabolites    1000 Barbiturate and metabolites    200 Benzodiazepine                 024 Tricyclics and metabolites     300 Opiates and metabolites        300 Cocaine and metabolites        300 THC                            50 Performed at [facility], [place] [address], [place], [place] [address]   Urinalysis, Routine w reflex microscopic Urine, Clean Catch     Status: Abnormal   Collection Time: 03/02/21  4:34 PM  Result Value Ref Range   Color, Urine STRAW (A) YELLOW   APPearance CLEAR CLEAR   Specific Gravity, Urine 1.006 1.005 - 1.030   pH 7.0 5.0 - 8.0   Glucose, UA NEGATIVE NEGATIVE mg/dL   Hgb urine dipstick NEGATIVE NEGATIVE   Bilirubin Urine NEGATIVE NEGATIVE   Ketones, ur NEGATIVE NEGATIVE mg/dL   Protein, ur NEGATIVE NEGATIVE mg/dL   Nitrite NEGATIVE NEGATIVE   Leukocytes,Ua NEGATIVE NEGATIVE    Comment: Performed at [facility] [address], [place], [place] [address]  Resp Panel by RT-PCR (Flu A&B, Covid) Urine, Clean Catch     Status: None   Collection Time: 03/02/21  4:34 PM   Specimen: Urine, Clean Catch; Nasopharyngeal(NP) swabs in vial transport medium  Result Value Ref Range   SARS Coronavirus 2 by RT PCR NEGATIVE NEGATIVE    Comment: (NOTE) SARS-CoV-2 target nucleic acids are NOT DETECTED.  The SARS-CoV-2 RNA is generally detectable in upper respiratory specimens during the acute phase of infection. The lowest concentration of SARS-CoV-2 viral copies this assay can detect is 138 copies/mL. A negative result does not preclude SARS-Cov-2 infection and should not be used as the sole basis for treatment or other patient management decisions. A negative result may occur with  improper specimen collection/handling, submission of specimen other than nasopharyngeal swab, presence of viral mutation(s) within the areas targeted by this assay, and inadequate number of viral copies(<138 copies/mL). A negative result must be  combined with clinical observations, patient history, and epidemiological information. The expected result is Negative.  Fact Sheet for Patients:  EntrepreneurPulse.com.au  Fact Sheet for Healthcare Providers:  IncredibleEmployment.be  This test is no t yet approved or cleared by the [place] FDA and  has been authorized for detection and/or diagnosis of SARS-CoV-2 by FDA under an Emergency Use Authorization (EUA). This EUA will remain  in effect (meaning this test can be used) for the duration of the COVID-19 declaration under Section 564(b)(1) of the Act, 21 U.S.C.section 360bbb-3(b)(1), unless the authorization is terminated  or revoked sooner.       Influenza A by PCR NEGATIVE NEGATIVE   Influenza B by PCR NEGATIVE NEGATIVE    Comment: (NOTE) The Xpert Xpress SARS-CoV-2/FLU/RSV plus assay is intended as an aid in the diagnosis of influenza  from Nasopharyngeal swab specimens and should not be used as a sole basis for treatment. Nasal washings and aspirates are unacceptable for Xpert Xpress SARS-CoV-2/FLU/RSV testing.  Fact Sheet for Patients: EntrepreneurPulse.com.au  Fact Sheet for Healthcare Providers: IncredibleEmployment.be  This test is not yet approved or cleared by the [place] FDA and has been authorized for detection and/or diagnosis of SARS-CoV-2 by FDA under an Emergency Use Authorization (EUA). This EUA will remain in effect (meaning this test can be used) for the duration of the COVID-19 declaration under Section 564(b)(1) of the Act, 21 U.S.C. section 360bbb-3(b)(1), unless the authorization is terminated or revoked.  Performed at [facility], [place] [address], [place], [place] [address]     Blood Alcohol level:  Lab Results  Component Value Date   ETH <10 11/21/2017   ETH <10 51/88/4166    Metabolic Disorder Labs:  No results found for: HGBA1C, MPG No results  found for: PROLACTIN Lab Results  Component Value Date   CHOL 205 (H) 09/29/2016   TRIG 101 09/29/2016   HDL 66 09/29/2016   CHOLHDL 3.1 09/29/2016   VLDL 20 09/29/2016   LDLCALC 119 (H) 09/29/2016    Current Medications: Current Facility-Administered Medications  Medication Dose Route Frequency Provider Last Rate Last Admin   acetaminophen (TYLENOL) tablet 650 mg  650 mg Oral Q6H PRN Quintana Canelo, Madie Reno, MD       alum & mag hydroxide-simeth (MAALOX/MYLANTA) 200-200-20 MG/5ML suspension 30 mL  30 mL Oral Q4H PRN Aalyssa Elderkin, Madie Reno, MD       amLODipine (NORVASC) tablet 10 mg  10 mg Oral Daily Vaishali Baise, Madie Reno, MD   10 mg at 03/03/21 1548   atorvastatin (LIPITOR) tablet 40 mg  40 mg Oral 1 day or 1 dose Babara Buffalo T, MD   40 mg at 03/03/21 1547   bisacodyl (DULCOLAX) EC tablet 5 mg  5 mg Oral Daily PRN Damary Doland, Madie Reno, MD       divalproex (DEPAKOTE) DR tablet 750 mg  750 mg Oral QHS Miaisabella Bacorn T, MD       gabapentin (NEURONTIN) capsule 300 mg  300 mg Oral BID Rehman Levinson, Madie Reno, MD       gabapentin (NEURONTIN) capsule 300 mg  300 mg Oral BID Kaycen Whitworth T, MD   300 mg at 03/03/21 1547   latanoprost (XALATAN) 0.005 % ophthalmic solution 1 drop  1 drop Right Eye BID Meelah Tallo, Madie Reno, MD       LORazepam (ATIVAN) tablet 1 mg  1 mg Oral Q6H PRN Kieran Nachtigal, Madie Reno, MD   1 mg at 03/03/21 1524   magnesium hydroxide (MILK OF MAGNESIA) suspension 30 mL  30 mL Oral Daily PRN Calista Crain, Madie Reno, MD       pantoprazole (PROTONIX) EC tablet 40 mg  40 mg Oral BID Jalexis Breed T, MD       QUEtiapine (SEROQUEL) tablet 300 mg  300 mg Oral QHS Kvon Mcilhenny T, MD       PTA Medications: Medications Prior to Admission  Medication Sig Dispense Refill Last Dose   amLODipine (NORVASC) 10 MG tablet Take 10 mg by mouth daily.  0    aspirin EC 81 MG tablet Take 81 mg by mouth daily. Reported on 08/18/2015      atorvastatin (LIPITOR) 40 MG tablet Take 1 tablet by mouth 1 day or 1 dose.      bisacodyl (DULCOLAX) 5 MG EC  tablet Take 1 tablet (5 mg  total) by mouth daily as needed for moderate constipation. 15 tablet 0    carvedilol (COREG) 25 MG tablet Take 25 mg by mouth in the morning and at bedtime.      gabapentin (NEURONTIN) 300 MG capsule Take 300 mg by mouth 2 (two) times daily.      hydrOXYzine (ATARAX/VISTARIL) 50 MG tablet Take 100 mg by mouth at bedtime.      latanoprost (XALATAN) 0.005 % ophthalmic solution Place 1 drop into the right eye 2 (two) times daily.      Multiple Vitamin (MULTIVITAMIN WITH MINERALS) TABS tablet Take 1 tablet by mouth daily.      Omega-3 Fatty Acids (FISH OIL) 1000 MG CAPS Take 1,000 mg by mouth 2 (two) times daily.       pantoprazole (PROTONIX) 40 MG tablet Take 40 mg by mouth 2 (two) times daily.      QUEtiapine (SEROQUEL) 300 MG tablet Take 300 mg by mouth at bedtime.       Musculoskeletal: Strength & Muscle Tone: decreased Gait & Station: unsteady Patient leans: N/A            Psychiatric Specialty Exam:  Presentation  General Appearance: Appropriate for Environment; Casual  Eye Contact:Fair  Speech:Clear and Coherent; Normal Rate  Speech Volume:Normal  Handedness:No data recorded  Mood and Affect  Mood:Euthymic  Affect:Appropriate; Congruent   Thought Process  Thought Processes:Coherent; Goal Directed; Linear  Duration of Psychotic Symptoms: No data recorded Past Diagnosis of Schizophrenia or Psychoactive disorder: No data recorded Descriptions of Associations:Intact  Orientation:Full (Time, Place and Person)  Thought Content:Logical; WDL  Hallucinations:No data recorded Ideas of Reference:None  Suicidal Thoughts:No data recorded Homicidal Thoughts:No data recorded  Sensorium  Memory:Immediate Good; Remote Fair; Recent Fair  Judgment:Intact  Insight:Present   Executive Functions  Concentration:Fair  Attention Span:Fair  [place]   Psychomotor Activity  Psychomotor  Activity:No data recorded  Assets  Assets:Communication Skills; Desire for Improvement; Resilience   Sleep  Sleep:No data recorded   Physical Exam: Physical Exam Vitals and nursing note reviewed.  Constitutional:      Appearance: Normal appearance.  HENT:     Head: Normocephalic and atraumatic.     Mouth/Throat:     Pharynx: Oropharynx is clear.  Eyes:     Pupils: Pupils are equal, round, and reactive to light.  Cardiovascular:     Rate and Rhythm: Normal rate and regular rhythm.  Pulmonary:     Effort: Pulmonary effort is normal.     Breath sounds: Normal breath sounds.  Abdominal:     General: Abdomen is flat.     Palpations: Abdomen is soft.  Musculoskeletal:        General: Normal range of motion.  Skin:    General: Skin is warm and dry.  Neurological:     General: No focal deficit present.     Mental Status: He is alert. Mental status is at baseline.  Psychiatric:        Attention and Perception: He is inattentive.        Mood and Affect: Mood is elated. Affect is labile.        Speech: Speech is rapid and pressured and tangential.        Behavior: Behavior is agitated. Behavior is not aggressive.        Thought Content: Thought content normal.        Cognition and Memory: Memory is impaired.   Review of Systems  Constitutional:  Negative.   HENT: Negative.    Eyes: Negative.   Respiratory: Negative.    Cardiovascular: Negative.   Gastrointestinal: Negative.   Musculoskeletal: Negative.   Skin: Negative.   Neurological: Negative.   Psychiatric/Behavioral: Negative.    Blood pressure (!) 156/78, pulse 79, temperature 97.8 F (36.6 C), temperature source Oral, resp. rate 20, height 6' (1.829 m), weight 97.5 kg, SpO2 98 %. Body mass index is 29.16 kg/m.  Treatment Plan Summary: Medication management and Plan I discussed the risks and benefits of restarting mood stabilizers with the patient and his wife.  Both of them are aware of the risks of hyponatremia  but both would prefer that he be started back on his previous medicine because they suspect he may tolerate it well once again and it is most important to them that his mania be controlled.  He is continued on the Seroquel 300 mg at night.  We will restart Depakote 750 mg at night and I have ordered daily metabolic panels for the next 5 days.  We will watch to see how his sodium does over that period of time.  15-minute checks continued.  Blood pressure and Lipitor continued.  Observation Level/Precautions:  15 minute checks  Laboratory:  Chemistry Profile  Psychotherapy:    Medications:    Consultations:    Discharge Concerns:    Estimated LOS:  Other:     Physician Treatment Plan for Primary Diagnosis: Bipolar affective disorder, current episode manic ([place]) Long Term Goal(s): Improvement in symptoms so as ready for discharge  Short Term Goals: Ability to demonstrate self-control will improve and Ability to identify and develop effective coping behaviors will improve  Physician Treatment Plan for Secondary Diagnosis: Principal Problem:   Bipolar affective disorder, current episode manic ([place]) Active Problems:   Benign essential hypertension   Hyperlipidemia, mixed   Hyponatremia syndrome  Long Term Goal(s): Improvement in symptoms so as ready for discharge  Short Term Goals: Ability to maintain clinical measurements within normal limits will improve and Compliance with prescribed medications will improve  I certify that inpatient services furnished can reasonably be expected to improve the patient's condition.    Alethia Berthold, MD 11/3/20224:42 PM

## 2021-03-03 NOTE — ED Provider Notes (Addendum)
Emergency Medicine Observation Re-evaluation Note  Kayhan Boardley. is a 75 y.o. male, seen on rounds today.  Pt initially presented to the ED for complaints of Manic Behavior Currently, the patient is Awake, alert, speaking clearly.  Physical Exam  BP (!) 157/79   Pulse 99   Temp 98.2 F (36.8 C)   Resp 16   Ht 6' (1.829 m)   Wt 100 kg   SpO2 99%   BMI 29.90 kg/m  Physical Exam General: No distress Cardiac: Regular rate and rhythm Lungs: No increased work of breathing Psych: Calm  ED Course / MDM  EKG:   I have reviewed the labs performed to date as well as medications administered while in observation.  Recent changes in the last 24 hours include none.  Plan  Current plan is for behavioral health evaluation.  [name]. is not under involuntary commitment.     Carmin Muskrat, MD 03/03/21 1027  1:01 PM Patient accepted in transfer to [facility], psych unit.    Carmin Muskrat, MD 03/03/21 1301

## 2021-03-03 NOTE — BHH Suicide Risk Assessment (Signed)
[facility] Admission Suicide Risk Assessment   Nursing information obtained from:  Patient Demographic factors:  Age 75 or older, Male Current Mental Status:  NA Loss Factors:  NA Historical Factors:  NA Risk Reduction Factors:  NA  Total Time spent with patient: 1 hour Principal Problem: Bipolar affective disorder, current episode manic (HCC) Diagnosis:  Principal Problem:   Bipolar affective disorder, current episode manic ([place]) Active Problems:   Benign essential hypertension   Hyperlipidemia, mixed   Hyponatremia syndrome  Subjective Data: 75 year old man with a history of bipolar disorder transferred from [facility] for admission to geriatric psychiatry bed.  Presenting with symptoms of mania euphoria agitation.  No suicidal thoughts whatsoever.  No violence or threats.  Continued Clinical Symptoms:  Alcohol Use Disorder Identification Test Final Score (AUDIT): 0 The "Alcohol Use Disorders Identification Test", Guidelines for Use in Primary Care, Second Edition.  World Pharmacologist [facility]). Score between 0-7:  no or low risk or alcohol related problems. Score between 8-15:  moderate risk of alcohol related problems. Score between 16-19:  high risk of alcohol related problems. Score 20 or above:  warrants further diagnostic evaluation for alcohol dependence and treatment.   CLINICAL FACTORS:   Bipolar Disorder:   Bipolar II   Musculoskeletal: Strength & Muscle Tone: within normal limits Gait & Station: unsteady Patient leans: N/A  Psychiatric Specialty Exam:  Presentation  General Appearance: Appropriate for Environment; Casual  Eye Contact:Fair  Speech:Clear and Coherent; Normal Rate  Speech Volume:Normal  Handedness:No data recorded  Mood and Affect  Mood:Euthymic  Affect:Appropriate; Congruent   Thought Process  Thought Processes:Coherent; Goal Directed; Linear  Descriptions of Associations:Intact  Orientation:Full (Time, Place and  Person)  Thought Content:Logical; WDL  History of Schizophrenia/Schizoaffective disorder:No data recorded Duration of Psychotic Symptoms:No data recorded Hallucinations:No data recorded Ideas of Reference:None  Suicidal Thoughts:No data recorded Homicidal Thoughts:No data recorded  Sensorium  Memory:Immediate Good; Remote Fair; Recent Fair  Judgment:Intact  Insight:Present   Executive Functions  Concentration:Fair  Attention Span:Fair  [place]   Psychomotor Activity  Psychomotor Activity:No data recorded  Assets  Assets:Communication Skills; Desire for Improvement; Resilience   Sleep  Sleep:No data recorded   Physical Exam: Physical Exam Vitals and nursing note reviewed.  Constitutional:      Appearance: Normal appearance.  HENT:     Head: Normocephalic and atraumatic.     Mouth/Throat:     Pharynx: Oropharynx is clear.  Eyes:     Pupils: Pupils are equal, round, and reactive to light.  Cardiovascular:     Rate and Rhythm: Normal rate and regular rhythm.  Pulmonary:     Effort: Pulmonary effort is normal.     Breath sounds: Normal breath sounds.  Abdominal:     General: Abdomen is flat.     Palpations: Abdomen is soft.  Musculoskeletal:        General: Normal range of motion.  Skin:    General: Skin is warm and dry.  Neurological:     General: No focal deficit present.     Mental Status: He is alert. Mental status is at baseline.  Psychiatric:        Attention and Perception: He is inattentive.        Mood and Affect: Mood is elated. Affect is labile.        Speech: Speech is rapid and pressured and tangential.        Behavior: Behavior is agitated. Behavior is not aggressive.  Thought Content: Thought content normal.        Cognition and Memory: Memory is impaired.   Review of Systems  Constitutional: Negative.   HENT: Negative.    Eyes: Negative.   Respiratory: Negative.     Cardiovascular: Negative.   Gastrointestinal: Negative.   Musculoskeletal: Negative.   Skin: Negative.   Neurological: Negative.   Psychiatric/Behavioral: Negative.    Blood pressure (!) 156/78, pulse 79, temperature 97.8 F (36.6 C), temperature source Oral, resp. rate 20, height 6' (1.829 m), weight 97.5 kg, SpO2 98 %. Body mass index is 29.16 kg/m.   COGNITIVE FEATURES THAT CONTRIBUTE TO RISK:  Polarized thinking    SUICIDE RISK:   Minimal: No identifiable suicidal ideation.  Patients presenting with no risk factors but with morbid ruminations; may be classified as minimal risk based on the severity of the depressive symptoms  PLAN OF CARE: Restart medication as detailed in the intake.  Monitor and keep on 15-minute checks.  Restart medical medication.  Check sodium daily.  Reassess suicidality as needed and prior to discharge  I certify that inpatient services furnished can reasonably be expected to improve the patient's condition.   Alethia Berthold, MD 03/03/2021, 4:38 PM

## 2021-03-03 NOTE — Group Note (Signed)
[facility] LCSW Group Therapy Note   Group Date: 03/03/2021 Start Time: 1400 End Time: 1430   Type of Therapy/Topic:  Group Therapy:  Balance in Life  Participation Level:  Did Not Attend   Description of Group:    This group will address the concept of balance and how it feels and looks when one is unbalanced. Patients will be encouraged to process areas in their lives that are out of balance, and identify reasons for remaining unbalanced. Facilitators will guide patients utilizing problem- solving interventions to address and correct the stressor making their life unbalanced. Understanding and applying boundaries will be explored and addressed for obtaining  and maintaining a balanced life. Patients will be encouraged to explore ways to assertively make their unbalanced needs known to significant others in their lives, using other group members and facilitator for support and feedback.  Therapeutic Goals: Patient will identify two or more emotions or situations they have that consume much of in their lives. Patient will identify signs/triggers that life has become out of balance:  Patient will identify two ways to set boundaries in order to achieve balance in their lives:  Patient will demonstrate ability to communicate their needs through discussion and/or role plays  Summary of Patient Progress: X    Therapeutic Modalities:   Cognitive Behavioral Therapy Solution-Focused Therapy Assertiveness Training   Wren Martinique, LCSWA

## 2021-03-03 NOTE — BH Assessment (Addendum)
@  0845, TTS Clinician attempted to complete patient's assessment. Notified patient's nurse Catalina Antigua ,RN) via secure chat requesting an update of patient's availability to be seen by TTS. Response from nursing is pending.

## 2021-03-03 NOTE — Progress Notes (Signed)
Pt accepted to [facility] L-30   Patient meets inpatient criteria per Ricky Ala, NP   The attending provider will be Selina Cooley, MD   Call report to [phone]  Esmond Plants, RN @ [facility] ED notified.     Pt scheduled  to arrive at [facility] and the bed is available NOW.   Mariea Clonts, MSW, LCSW-A  11:30 AM 03/03/2021

## 2021-03-03 NOTE — BH Assessment (Signed)
TTS clinician attempted to complete assessment. Per Catalina Antigua, RN, after multiple attempts, patient is not alert/oriented to complete assessment at this time. TTS attempt at later time.

## 2021-03-03 NOTE — BH Assessment (Addendum)
@  0924, TTS Clinician attempted to complete patient's assessment. Notified patient's nurse Santiago Glad ,RN) and NT Janett Billow) via secure chat requesting an update of patient's availability to be seen by TTS. Response from nursing is pending.

## 2021-03-04 LAB — BASIC METABOLIC PANEL
Anion gap: 10 (ref 5–15)
BUN: 13 mg/dL (ref 8–23)
CO2: 24 mmol/L (ref 22–32)
Calcium: 9.1 mg/dL (ref 8.9–10.3)
Chloride: 103 mmol/L (ref 98–111)
Creatinine, Ser: 0.68 mg/dL (ref 0.61–1.24)
GFR, Estimated: >60 mL/min (ref >60)
Glucose, Bld: 111 mg/dL — ABNORMAL HIGH (ref 70–99)
Potassium: 3.9 mmol/L (ref 3.5–5.1)
Sodium: 137 mmol/L (ref 135–145)

## 2021-03-04 NOTE — Progress Notes (Signed)
Recreation Therapy Notes  INPATIENT RECREATION THERAPY ASSESSMENT  Patient Details Name: Douglas Perkins. MRN: [id number] DOB: 1945/07/16 Today's Date: 03/04/2021       Information Obtained From: Patient  Able to Participate in Assessment/Interview: Yes  Patient Presentation: Responsive, Hyperverbal  Reason for Admission (Per Patient): Active Symptoms  Patient Stressors:    Coping Skills:   Talk, Prayer  Leisure Interests (2+):  Social - Family, Social - Friends, Sports - Dance, Individual - TV, Music - Listen  Frequency of Recreation/Participation: Weekly  Awareness of Community Resources:  Yes  Community Resources:  [employer]  Current Use: Yes  If no, Barriers?:    Expressed Interest in [place]: Yes  County of Residence:  Insurance underwriter  Patient Main Form of Transportation: Musician (Wife drives)  Patient Strengths:  Alot  Patient Identified Areas of Improvement:  Not bother people  Patient Goal for Hospitalization:  Get home safe  Current SI (including self-harm):  No  Current HI:  No  Current AVH: No  Staff Intervention Plan: Group Attendance, Collaborate with Interdisciplinary Treatment Team  Consent to Intern Participation: N/A  Asaf Elmquist 03/04/2021, 4:22 PM

## 2021-03-04 NOTE — Progress Notes (Signed)
Recreation Therapy Notes  INPATIENT RECREATION TR PLAN  Patient Details Name: Douglas Perkins. MRN: [id number] DOB: 04-24-1946 Today's Date: 03/04/2021  Rec Therapy Plan Is patient appropriate for Therapeutic Recreation?: Yes Treatment times per week: at least 3 Estimated Length of Stay: 5-7 days TR Treatment/Interventions: Group participation (Comment)  Discharge Criteria Pt will be discharged from therapy if:: Discharged Treatment plan/goals/alternatives discussed and agreed upon by:: Patient/family  Discharge Summary     Jesua Tamblyn 03/04/2021, 4:23 PM

## 2021-03-04 NOTE — Progress Notes (Addendum)
Patient denies SI, HI, and AVH. Patient is hyperverbal and has flight of ideas. He is grandiose and frequently tells staff to "call the president". Patient has been pleasant this morning and is up for breakfast. Appetite is good. Patient is in the dayroom talking to staff and another patient on the unit. Patient was also shown how to use the phones on the unit and made a call to his wife.   Patient's linens and the floor in his room were found to be wet. Linens changed and floor wiped down.

## 2021-03-04 NOTE — Progress Notes (Signed)
Assumed care. Pt report received from [place], [place]

## 2021-03-04 NOTE — Progress Notes (Signed)
[facility] MD Progress Note  03/04/2021 11:05 AM [name].  MRN:  [id number]  CC "I'm great!"  Subjective:  75 year old male prseenting for acute mania and agitation. No acute events overnight, medication compliant, attending to ADLs. Patient seen this morning and he is manic with tangential speech and grandiosity. He is quite difficult to keep on topic. He discusses being best friends with Coach K and needing to tell him our facility is great, and also mentions golfing with Trump. He then proceeds on a political tangent about making [place] great again. He does mention he was off his medications for a few days, and asks to remain on Depakote and then return home. After this he remains fixated on getting a meeting with the president of [facility] to inform him of our staff's exceptional care we are providing.   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[place])    Mural thrombus of cardiac apex    Presence of permanent cardiac pacemaker 2017   SA node dysfunction   Sleep apnea    does not use CPAP   TIA (transient ischemic attack)     Past Surgical History:  Procedure Laterality Date   CHOLECYSTECTOMY     COLONOSCOPY WITH PROPOFOL N/A 01/31/2017   Procedure: COLONOSCOPY WITH PROPOFOL;  Surgeon: Manya Silvas, MD;  Location: [facility] ENDOSCOPY;  Service: Endoscopy;  Laterality: N/A;   DRUG INDUCED ENDOSCOPY N/A 05/12/2020   Procedure: DRUG INDUCED ENDOSCOPY;  Surgeon: Melida Quitter, MD;  Location: [place];   Service: ENT;  Laterality: N/A;   IMPLANTATION OF HYPOGLOSSAL NERVE STIMULATOR N/A 07/21/2020   Procedure: IMPLANTATION OF HYPOGLOSSAL NERVE STIMULATOR;  Surgeon: Melida Quitter, MD;  Location: [place];  Service: ENT;  Laterality: N/A;   LEG SURGERY Left    distal   Pace maker placement     PROSTATE SURGERY     PROSTATECTOMY     SKIN CANCER EXCISION  2006,2007   Merkle Cell Carcinoma   Family History:  Family History  Problem Relation Age of Onset   Stroke Father    CAD Paternal Grandmother    CAD Paternal Grandfather    Family Psychiatric  History: See H&P Social History:  Social History   Substance and Sexual Activity  Alcohol Use No   Alcohol/week: 0.0 standard drinks     Social History   Substance and Sexual Activity  Drug Use No    Social History   Socioeconomic History   Marital status: Married    Spouse name: Not on file   Number of children: Not on file   Years of education: Not on file   Highest education level: Not on file  Occupational History   Occupation: disabled  Tobacco Use   Smoking status: Never   Smokeless tobacco: Never  Substance and Sexual Activity   Alcohol use: No    Alcohol/week: 0.0 standard drinks   Drug use: No   Sexual activity: Not on file  Other Topics Concern   Not  on file  Social History Narrative   Not on file   Social Determinants of Health   Financial Resource Strain: Not on file  Food Insecurity: Not on file  Transportation Needs: Not on file  Physical Activity: Not on file  Stress: Not on file  Social Connections: Not on file   Additional Social History:                         Sleep: Poor  Appetite:  Good  Current Medications: Current Facility-Administered Medications  Medication Dose Route Frequency Provider Last Rate Last Admin   acetaminophen (TYLENOL) tablet 650 mg  650 mg Oral Q6H PRN Clapacs, Madie Reno, MD       alum & mag hydroxide-simeth (MAALOX/MYLANTA) 200-200-20 MG/5ML  suspension 30 mL  30 mL Oral Q4H PRN Clapacs, Madie Reno, MD       amLODipine (NORVASC) tablet 10 mg  10 mg Oral Daily Clapacs, Madie Reno, MD   10 mg at 03/04/21 0905   atorvastatin (LIPITOR) tablet 40 mg  40 mg Oral 1 day or 1 dose Clapacs, John T, MD   40 mg at 03/03/21 1547   bisacodyl (DULCOLAX) EC tablet 5 mg  5 mg Oral Daily PRN Clapacs, Madie Reno, MD       divalproex (DEPAKOTE) DR tablet 750 mg  750 mg Oral QHS Clapacs, John T, MD   750 mg at 03/03/21 2137   gabapentin (NEURONTIN) capsule 300 mg  300 mg Oral BID Clapacs, Madie Reno, MD       latanoprost (XALATAN) 0.005 % ophthalmic solution 1 drop  1 drop Right Eye BID Clapacs, Madie Reno, MD   1 drop at 03/04/21 0905   LORazepam (ATIVAN) tablet 1 mg  1 mg Oral Q6H PRN Clapacs, Madie Reno, MD   1 mg at 03/04/21 0905   magnesium hydroxide (MILK OF MAGNESIA) suspension 30 mL  30 mL Oral Daily PRN Clapacs, Madie Reno, MD       pantoprazole (PROTONIX) EC tablet 40 mg  40 mg Oral BID Clapacs, Madie Reno, MD   40 mg at 03/04/21 0905   QUEtiapine (SEROQUEL) tablet 300 mg  300 mg Oral QHS Clapacs, John T, MD   300 mg at 03/03/21 2136    Lab Results:  Results for orders placed or performed during the hospital encounter of 03/03/21 (from the past 48 hour(s))  Basic metabolic panel     Status: Abnormal   Collection Time: 03/04/21  7:36 AM  Result Value Ref Range   Sodium 137 135 - 145 mmol/L   Potassium 3.9 3.5 - 5.1 mmol/L   Chloride 103 98 - 111 mmol/L   CO2 24 22 - 32 mmol/L   Glucose, Bld 111 (H) 70 - 99 mg/dL    Comment: Glucose reference range applies only to samples taken after fasting for at least 8 hours.   BUN 13 8 - 23 mg/dL   Creatinine, Ser 0.68 0.61 - 1.24 mg/dL   Calcium 9.1 8.9 - 10.3 mg/dL   GFR, Estimated >60 >60 mL/min    Comment: (NOTE) Calculated using the CKD-EPI Creatinine Equation (2021)    Anion gap 10 5 - 15    Comment: Performed at [facility], [address], [place],  [address]    Blood Alcohol level:  Lab Results   Component Value Date   [facility] <10 11/21/2017   ETH <10 32/99/2426    Metabolic Disorder Labs: No results found  for: HGBA1C, MPG No results found for: PROLACTIN Lab Results  Component Value Date   CHOL 205 (H) 09/29/2016   TRIG 101 09/29/2016   HDL 66 09/29/2016   CHOLHDL 3.1 09/29/2016   VLDL 20 09/29/2016   LDLCALC 119 (H) 09/29/2016    Physical Findings: AIMS:  , ,  ,  ,    CIWA:    COWS:     Musculoskeletal: Strength & Muscle Tone: within normal limits Gait & Station: normal Patient leans: N/A  Psychiatric Specialty Exam:  Presentation  General Appearance: Appropriate for Environment  Eye Contact:Fair  Speech:Pressured  Speech Volume:Normal  Handedness:Right   Mood and Affect  Mood:Euphoric  Affect:Congruent   Thought Process  Thought Processes:Disorganized  Descriptions of Associations:Tangential  Orientation:Full (Time, Place and Person)  Thought Content:Delusions; Tangential  History of Schizophrenia/Schizoaffective disorder:No  Duration of Psychotic Symptoms:N/A  Hallucinations:Hallucinations: None  Ideas of Reference:None  Suicidal Thoughts:Suicidal Thoughts: No  Homicidal Thoughts:Homicidal Thoughts: No   Sensorium  Memory:Immediate Fair; Recent Fair; Remote [place]   Executive Functions  Concentration:Poor  Attention Span:Poor  [place]   Psychomotor Activity  Psychomotor Activity:Psychomotor Activity: Decreased   Assets  Assets:Desire for Improvement; Financial Resources/Insurance; Housing; Intimacy; Resilience; Social Support; Leisure Time   Sleep  Sleep:Sleep: Poor    Physical Exam: Physical Exam ROS Blood pressure (!) 154/78, pulse 99, temperature 98.4 F (36.9 C), temperature source Oral, resp. rate 18, height 6' (1.829 m), weight 97.5 kg, SpO2 99 %. Body mass index is 29.16 kg/m.   Treatment Plan Summary: Daily  contact with patient to assess and evaluate symptoms and progress in treatment and Medication management 75 year old male presenting with acute mania and agitation.   Bipolar I disorder, current episode manic without psychosis - Continue Depakote 750 mg QHS, Seroquel 300 mg QHS - History of hyponatremia with depakote, current Na 137 today  HTN - Continue Norvasc 10 mg daily   HLD - Continue Lipitor 40 mg daily   GERD - Continue protonix 40 mg BID  Glaucoma - Continue latanoprost 1 drop, right eye, BID  Salley Scarlet, MD 03/04/2021, 11:05 AM

## 2021-03-04 NOTE — Progress Notes (Signed)
Recreation Therapy Notes  Date: 03/04/2021  Time: 3:30pm  Location: Courtyard   Behavioral response: Appropriate  Group Type: Leisure  Participation level: Active  Communication: Patient was social with peers and staff.  Comments: N/A  Lorely Bubb LRT/CTRS         Finesse Fielder 03/04/2021 4:36 PM

## 2021-03-04 NOTE — Progress Notes (Signed)
Pt lying in bed with eyes open; calm, cooperative. Pt states that he feels "good". Pt denies pain and SI/HI/AVH at this time. Pt reports that he sleeps "great" and describes his appetite as "great". 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 Pt also states that in the '70s, he showed a golf course to [employer]. Pt offered and provided snack. No acute distress noted.

## 2021-03-04 NOTE — Progress Notes (Signed)
Patient is pacing up and down the halls on the unit. He comes to the nursing station multiple times saying that he needs to "book an appointment with the president to tell him how good you guys are".

## 2021-03-04 NOTE — Progress Notes (Signed)
Upon beginning of shift, patient noted with wife visiting. In dayroom patient loud and intrusive, manic, hyper-verbal. Continues to talk over spouse and nurse. Patient requests meds early, prn given for sleep and anxiety. Effective thus far. Patient continues to ring bell off and on throughout early shift, then states he didn't do that. Pt in bed, per nurse tech, patient noted with private out stating some lady just came and felt his private. Pt is pleasant.  Encouragement and support provided. Safety checks maintained. Medications given as prescribed. Pt receptive and remains safe on unit with q 15 min checks.

## 2021-03-04 NOTE — Progress Notes (Signed)
Recreation Therapy Notes  Date: 03/04/2021  Time: 1:00 pm   Location: Day room   Behavioral response: Appropriate  Intervention Topic: Social skills   Discussion/Intervention:  Group content on today was focused on social skills. The group defined social skills and identified ways they use social skills. Patients expressed what obstacles they face when trying to be social. Participants described the importance of social skills. The group listed ways to improve social skills and reasons to improve social skills. Individuals had an opportunity to learn new and improve social skills as well as identify their weaknesses. Clinical Observations/Feedback: Patient came to group and was resistant on participating in group. After much encouragement from [name] patient joined group. 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 Individual was social with peers and staff while participating in the intervention. [name] LRT/CTRS         Douglas Perkins 03/04/2021 3:11 PM

## 2021-03-04 NOTE — BH IP Treatment Plan (Signed)
Interdisciplinary Treatment and Diagnostic Plan Update  03/04/2021 Time of Session: 10:00AM Anfernee Peschke. MRN: [id number]  Principal Diagnosis: Bipolar affective disorder, current episode manic ([place])  Secondary Diagnoses: Principal Problem:   Bipolar affective disorder, current episode manic ([place]) Active Problems:   Benign essential hypertension   Hyperlipidemia, mixed   Hyponatremia syndrome   Current Medications:  Current Facility-Administered Medications  Medication Dose Route Frequency Provider Last Rate Last Admin   acetaminophen (TYLENOL) tablet 650 mg  650 mg Oral Q6H PRN Clapacs, John T, MD       alum & mag hydroxide-simeth (MAALOX/MYLANTA) 200-200-20 MG/5ML suspension 30 mL  30 mL Oral Q4H PRN Clapacs, John T, MD       amLODipine (NORVASC) tablet 10 mg  10 mg Oral Daily Clapacs, John T, MD   10 mg at 03/04/21 0905   atorvastatin (LIPITOR) tablet 40 mg  40 mg Oral 1 day or 1 dose Clapacs, John T, MD   40 mg at 03/03/21 1547   bisacodyl (DULCOLAX) EC tablet 5 mg  5 mg Oral Daily PRN Clapacs, John T, MD       divalproex (DEPAKOTE) DR tablet 750 mg  750 mg Oral QHS Clapacs, John T, MD   750 mg at 03/03/21 2137   gabapentin (NEURONTIN) capsule 300 mg  300 mg Oral BID Clapacs, John T, MD       gabapentin (NEURONTIN) capsule 300 mg  300 mg Oral BID Clapacs, John T, MD   300 mg at 03/04/21 0905   latanoprost (XALATAN) 0.005 % ophthalmic solution 1 drop  1 drop Right Eye BID Clapacs, Madie Reno, MD   1 drop at 03/04/21 0905   LORazepam (ATIVAN) tablet 1 mg  1 mg Oral Q6H PRN Clapacs, John T, MD   1 mg at 03/04/21 0905   magnesium hydroxide (MILK OF MAGNESIA) suspension 30 mL  30 mL Oral Daily PRN Clapacs, John T, MD       pantoprazole (PROTONIX) EC tablet 40 mg  40 mg Oral BID Clapacs, Madie Reno, MD   40 mg at 03/04/21 0905   QUEtiapine (SEROQUEL) tablet 300 mg  300 mg Oral QHS Clapacs, John T, MD   300 mg at 03/03/21 2136   PTA Medications: Medications Prior to Admission  Medication  Sig Dispense Refill Last Dose   amLODipine (NORVASC) 10 MG tablet Take 10 mg by mouth daily.  0    aspirin EC 81 MG tablet Take 81 mg by mouth daily. Reported on 08/18/2015      atorvastatin (LIPITOR) 40 MG tablet Take 1 tablet by mouth 1 day or 1 dose.      bisacodyl (DULCOLAX) 5 MG EC tablet Take 1 tablet (5 mg total) by mouth daily as needed for moderate constipation. 15 tablet 0    carvedilol (COREG) 25 MG tablet Take 25 mg by mouth in the morning and at bedtime.      gabapentin (NEURONTIN) 300 MG capsule Take 300 mg by mouth 2 (two) times daily.      hydrOXYzine (ATARAX/VISTARIL) 50 MG tablet Take 100 mg by mouth at bedtime.      latanoprost (XALATAN) 0.005 % ophthalmic solution Place 1 drop into the right eye 2 (two) times daily.      Multiple Vitamin (MULTIVITAMIN WITH MINERALS) TABS tablet Take 1 tablet by mouth daily.      Omega-3 Fatty Acids (FISH OIL) 1000 MG CAPS Take 1,000 mg by mouth 2 (two) times daily.  pantoprazole (PROTONIX) 40 MG tablet Take 40 mg by mouth 2 (two) times daily.      QUEtiapine (SEROQUEL) 300 MG tablet Take 300 mg by mouth at bedtime.       Patient Stressors:    Patient Strengths:    Treatment Modalities: Medication Management, Group therapy, Case management,  1 to 1 session with clinician, Psychoeducation, Recreational therapy.   Physician Treatment Plan for Primary Diagnosis: Bipolar affective disorder, current episode manic ([place]) Long Term Goal(s): Improvement in symptoms so as ready for discharge   Short Term Goals: Ability to maintain clinical measurements within normal limits will improve Compliance with prescribed medications will improve Ability to demonstrate self-control will improve Ability to identify and develop effective coping behaviors will improve  Medication Management: Evaluate patient's response, side effects, and tolerance of medication regimen.  Therapeutic Interventions: 1 to 1 sessions, Unit Group sessions and Medication  administration.  Evaluation of Outcomes: Not Met  Physician Treatment Plan for Secondary Diagnosis: Principal Problem:   Bipolar affective disorder, current episode manic ([place]) Active Problems:   Benign essential hypertension   Hyperlipidemia, mixed   Hyponatremia syndrome  Long Term Goal(s): Improvement in symptoms so as ready for discharge   Short Term Goals: Ability to maintain clinical measurements within normal limits will improve Compliance with prescribed medications will improve Ability to demonstrate self-control will improve Ability to identify and develop effective coping behaviors will improve     Medication Management: Evaluate patient's response, side effects, and tolerance of medication regimen.  Therapeutic Interventions: 1 to 1 sessions, Unit Group sessions and Medication administration.  Evaluation of Outcomes: Not Met   RN Treatment Plan for Primary Diagnosis: Bipolar affective disorder, current episode manic ([place]) Long Term Goal(s): Knowledge of disease and therapeutic regimen to maintain health will improve  Short Term Goals: Ability to remain free from injury will improve, Ability to verbalize frustration and anger appropriately will improve, Ability to demonstrate self-control, Ability to participate in decision making will improve, Ability to verbalize feelings will improve, Ability to identify and develop effective coping behaviors will improve, and Compliance with prescribed medications will improve  Medication Management: RN will administer medications as ordered by provider, will assess and evaluate patient's response and provide education to patient for prescribed medication. RN will report any adverse and/or side effects to prescribing provider.  Therapeutic Interventions: 1 on 1 counseling sessions, Psychoeducation, Medication administration, Evaluate responses to treatment, Monitor vital signs and CBGs as ordered, Perform/monitor CIWA, COWS, AIMS and  Fall Risk screenings as ordered, Perform wound care treatments as ordered.  Evaluation of Outcomes: Not Met   LCSW Treatment Plan for Primary Diagnosis: Bipolar affective disorder, current episode manic ([place]) Long Term Goal(s): Safe transition to appropriate next level of care at discharge, Engage patient in therapeutic group addressing interpersonal concerns.  Short Term Goals: Engage patient in aftercare planning with referrals and resources, Increase social support, Increase ability to appropriately verbalize feelings, Increase emotional regulation, Facilitate acceptance of mental health diagnosis and concerns, Identify triggers associated with mental health/substance abuse issues, and Increase skills for wellness and recovery  Therapeutic Interventions: Assess for all discharge needs, 1 to 1 time with Social worker, Explore available resources and support systems, Assess for adequacy in community support network, Educate family and significant other(s) on suicide prevention, Complete Psychosocial Assessment, Interpersonal group therapy.  Evaluation of Outcomes: Not Met   Progress in Treatment: Attending groups: No. Participating in groups: No. Taking medication as prescribed: Yes. Toleration medication: Yes. Family/Significant other contact made:  No, will contact:  when given permission Patient understands diagnosis: Yes. Discussing patient identified problems/goals with staff: Yes. Medical problems stabilized or resolved: Yes. Denies suicidal/homicidal ideation: Yes. Issues/concerns per patient self-inventory: No. Other: None  New problem(s) identified: No, Describe:  None  New Short Term/Long Term Goal(s):  elimination of symptoms of psychosis, medication management for mood stabilization; elimination of SI thoughts; development of comprehensive mental wellness plan.   Patient Goals: "Get back on my Depakote"  Discharge Plan or Barriers: CSW will assist pt in development of  appropriate discharge/aftercare plan.   Reason for Continuation of Hospitalization: Aggression Delusions  Medication stabilization  Estimated Length of Stay: TBD   Scribe for Treatment Team: Gabreille Dardis A [place], Latanya Presser 03/04/2021 10:34 AM

## 2021-03-04 NOTE — Plan of Care (Signed)
  Problem: Education: Goal: Mental status will improve Outcome: Not Progressing   Problem: Coping: Goal: Ability to demonstrate self-control will improve Outcome: Not Progressing

## 2021-03-04 NOTE — BHH Counselor (Signed)
Adult Comprehensive Assessment  Patient ID: Douglas Perkins., male   DOB: Sep 23, 1945, 75 y.o.   MRN: [id number]  Information Source: Information source: Patient  Current Stressors:  Patient states their primary concerns and needs for treatment are:: "Low sodium" Patient states their goals for this hospitilization and ongoing recovery are:: "Be a better person always" Educational / Learning stressors: Pt denies Employment / Job issues: Pt denies Family Relationships: Pt denies Museum/gallery curator / Lack of resources (include bankruptcy): Pt denies Housing / Lack of housing: Pt denies Physical health (include injuries & life threatening diseases): Prostate cancer, in remission Social relationships: Pt denies Substance abuse: Pt denies Bereavement / Loss: Pt state his brother passed away  Living/Environment/Situation:  Living Arrangements: Other (Comment) (Assisted Living) Living conditions (as described by patient or guardian): "Very nice" How long has patient lived in current situation?: August 2022 What is atmosphere in current home: Comfortable, Supportive  Family History:  Marital status: Married Number of Years Married: 58 What types of issues is patient dealing with in the relationship?: Pt denies Are you sexually active?:  (Unable to assess) What is your sexual orientation?: Unable to assess Has your sexual activity been affected by drugs, alcohol, medication, or emotional stress?: Unable to assess Does patient have children?: Yes How many children?: 2 How is patient's relationship with their children?: Pt states he has a great relationship with his children  Childhood History:  By whom was/is the patient raised?: Both parents Additional childhood history information: "I was a preacher's kid" Description of patient's relationship with caregiver when they were a child: Pt states that his father was very strict Patient's description of current relationship with people who raised  him/her: Pt states his parents had a good relationship before they were deceased How were you disciplined when you got in trouble as a child/adolescent?: Pt states he received spankings Does patient have siblings?: Yes Number of Siblings: 3 (2 Brothers (1 deceased), 1 Sister) Description of patient's current relationship with siblings: Pt states he has a good relationship with his siblings Did patient suffer any verbal/emotional/physical/sexual abuse as a child?: No Did patient suffer from severe childhood neglect?: No Has patient ever been sexually abused/assaulted/raped as an adolescent or adult?: No Was the patient ever a victim of a crime or a disaster?: No Witnessed domestic violence?: No Has patient been affected by domestic violence as an adult?: No  Education:  Highest grade of school patient has completed: Brewing technologist degree Currently a Ship broker?: No Learning disability?: No  Employment/Work Situation:   Employment Situation: Retired Social research officer, government has Been Impacted by Current Illness: No What is the Longest Time Patient has Held a Job?: 20 years Where was the Patient Employed at that Time?: Conservation officer, nature Has Patient ever Been in the [employer]?: Yes (Describe in comment) Education officer, community) Did You Receive Any Psychiatric Treatment/Services While in Passenger transport manager?:  (unable to assess)  Financial Resources:   Financial resources: [employer] Doctor, hospital, Retirement) Does patient have a Programmer, applications or guardian?: No  Alcohol/Substance Abuse:   What has been your use of drugs/alcohol within the last 12 months?: Pt denies If attempted suicide, did drugs/alcohol play a role in this?: No Alcohol/Substance Abuse Treatment Hx: Denies past history Has alcohol/substance abuse ever caused legal problems?: No  Social Support System:   Patient's Community Support System: Good Describe Community Support System: Pt states he has good support from his wife, children and  grandchildren Type of faith/religion: "Christian" How does patient's faith help to cope with  current illness?: "Means someone's watching over you and it will always work out"  Leisure/Recreation:   Do You Have Hobbies?: Yes Leisure and Hobbies: Running, playing Bridge  Strengths/Needs:   What is the patient's perception of their strengths?: "I like to talk and help a lot of people...relatable." Patient states they can use these personal strengths during their treatment to contribute to their recovery: "Get satifaction knowing I'm helping others" Patient states these barriers may affect/interfere with their treatment: Pt denies Patient states these barriers may affect their return to the community: Pt denies  Discharge Plan:   Currently receiving community mental health services: Yes (From Whom) ([place]) Patient states concerns and preferences for aftercare planning are: Pt states he has everything he needs for his mental and physical health at [facility] Patient states they will know when they are safe and ready for discharge when: "When the doctor says so" Does patient have access to transportation?: Yes Does patient have financial barriers related to discharge medications?: No Will patient be returning to same living situation after discharge?: Yes  Summary/Recommendations:   Summary and Recommendations (to be completed by the evaluator): Patient is a 75 year old male, married, from [place], [place] [facility] He reports that he receives [employer] disability insurance and is retired. He presents to the urgent care voluntarily following presentation of mania. He has a primary diagnosis of Bipolar affective disorder, current episode manic. Recent stressors include sodium levels resulting in elimination of his Depakote, resulting in mania. He is oriented, has pressured, tangential speech, and fair insight. He wants to be back on his medication and return to his assisted living  facility at [facility].  Recommendations include: crisis stabilization, therapeutic milieu, encourage group attendance and participation, medication management for mood stabilization and development of comprehensive mental wellness plan.  Syleena Mchan A [place]. 03/04/2021

## 2021-03-04 NOTE — BHH Suicide Risk Assessment (Signed)
[facility] INPATIENT:  Family/Significant Other Suicide Prevention Education  Suicide Prevention Education:  Education Completed; Aleric Froelich, wife (name of family member/significant other) has been identified by the patient as the family member/significant other with whom the patient will be residing, and identified as the person(s) who will aid the patient in the event of a mental health crisis (suicidal ideations/suicide attempt).  With written consent from the patient, the family member/significant other has been provided the following suicide prevention education, prior to the and/or following the discharge of the patient.  The suicide prevention education provided includes the following: Suicide risk factors Suicide prevention and interventions National Suicide Hotline telephone number [facility] assessment telephone number [facility] Emergency Assistance [place] and/or Residential Mobile Crisis Unit telephone number  Request made of family/significant other to: Remove weapons (e.g., guns, rifles, knives), all items previously/currently identified as safety concern.   Remove drugs/medications (over-the-counter, prescriptions, illicit drugs), all items previously/currently identified as a safety concern.  The family member/significant other verbalizes understanding of the suicide prevention education information provided.  The family member/significant other agrees to remove the items of safety concern listed above.  Jaaziel Peatross A [place] 03/04/2021, 3:52 PM

## 2021-03-05 LAB — LIPID PANEL
Cholesterol: 116 mg/dL (ref 0–200)
HDL: 49 mg/dL (ref >40)
LDL Cholesterol: 54 mg/dL (ref 0–99)
Total CHOL/HDL Ratio: 2.4 RATIO
Triglycerides: 66 mg/dL (ref <150)
VLDL: 13 mg/dL (ref 0–40)

## 2021-03-05 LAB — HEMOGLOBIN A1C
Hgb A1c MFr Bld: 5.7 % — ABNORMAL HIGH (ref 4.8–5.6)
Mean Plasma Glucose: 116.89 mg/dL

## 2021-03-05 LAB — BASIC METABOLIC PANEL
Anion gap: 8 (ref 5–15)
BUN: 13 mg/dL (ref 8–23)
CO2: 24 mmol/L (ref 22–32)
Calcium: 8.9 mg/dL (ref 8.9–10.3)
Chloride: 100 mmol/L (ref 98–111)
Creatinine, Ser: 0.8 mg/dL (ref 0.61–1.24)
GFR, Estimated: >60 mL/min (ref >60)
Glucose, Bld: 110 mg/dL — ABNORMAL HIGH (ref 70–99)
Potassium: 4.2 mmol/L (ref 3.5–5.1)
Sodium: 132 mmol/L — ABNORMAL LOW (ref 135–145)

## 2021-03-05 MED ORDER — DORZOLAMIDE HCL-TIMOLOL MAL 2-0.5 % OP SOLN: 1.0000 [drp] | Freq: Two times a day (BID) | OPHTHALMIC | Status: DC

## 2021-03-05 MED ORDER — DORZOLAMIDE HCL 2 % OP SOLN
1.0000 [drp] | Freq: Two times a day (BID) | OPHTHALMIC | Status: DC
  Administered 2021-03-05 – 2021-03-09 (×9): 1 [drp] via OPHTHALMIC
  Filled 2021-03-05: qty 10

## 2021-03-05 MED ORDER — TIMOLOL MALEATE 0.5 % OP SOLN
1.0000 [drp] | Freq: Two times a day (BID) | OPHTHALMIC | Status: DC
  Administered 2021-03-05 – 2021-03-09 (×9): 1 [drp] via OPHTHALMIC
  Filled 2021-03-05: qty 5

## 2021-03-05 NOTE — Progress Notes (Signed)
Pt in dayroom is A/O to self.  Pt spoke with MD this a.m. and is unable to talk about plan of care.  Staff to continue to monitor as ordered for any additional changes in behavior and for continued safety

## 2021-03-05 NOTE — Progress Notes (Signed)
Uneventful night, slept eyes closed in no distress with prn provided. Currently in shower with MHT assistance bathing.

## 2021-03-05 NOTE — Plan of Care (Signed)
See progress note Problem: Education: Goal: Emotional status will improve Outcome: Progressing Goal: Mental status will improve Outcome: Progressing Goal: Verbalization of understanding the information provided will improve Outcome: Progressing   Problem: Activity: Goal: Sleeping patterns will improve Outcome: Progressing   Problem: Coping: Goal: Ability to verbalize frustrations and anger appropriately will improve Outcome: Progressing Goal: Ability to demonstrate self-control will improve Outcome: Progressing

## 2021-03-05 NOTE — Progress Notes (Signed)
At the beginning of the shift the patient was sitting in the dayroom watching TV. I tried to have a 1:1 with the patient, but he said he was going to bed. Pt stated as he was walking to his room while using the walker, "I'm going to bed goodnight. After he got in bed, he hit the call bell two times asking for his bedtime medication. I gave him his medication and he was more talkative. Pt denies any SI/HI and AV hallucinations. 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 You're great, I will let them know you are doing a good job." The talks about whatever comes to his mind, but he does not talk about what brought him to the hospital, because he doesn't understand why he's here.

## 2021-03-05 NOTE — Progress Notes (Signed)
[facility] MD Progress Note  03/05/2021 10:48 AM [name].  MRN:  [id number]  CC "I'm great!"  Subjective:  75 year old male prseenting for acute mania and agitation. No acute events overnight, medication compliant, attending to ADLs. Patient remains manic, grandiose, and tangential on exam today. He is AAOx4 and reports good sleep and appetite. Denies any side effects to resuming medications. Denies SI/HI/AH/VH. While aware he has bipolar I disorder, his insight into current manic symptoms is poor. He notes that he is supposed to be receiving Timolol eye drops in addition to latanoprost ordered. This was verified by chart review, and ordered.    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[place])    Mural thrombus of cardiac apex    Presence of permanent cardiac pacemaker 2017   SA node dysfunction   Sleep apnea    does not use CPAP   TIA (transient ischemic attack)     Past Surgical History:  Procedure Laterality Date   CHOLECYSTECTOMY     COLONOSCOPY WITH PROPOFOL N/A 01/31/2017   Procedure: COLONOSCOPY WITH PROPOFOL;  Surgeon: Manya Silvas, MD;  Location: [facility] ENDOSCOPY;  Service: Endoscopy;  Laterality: N/A;   DRUG INDUCED ENDOSCOPY N/A 05/12/2020   Procedure: DRUG INDUCED ENDOSCOPY;  Surgeon: Melida Quitter, MD;  Location: [place];  Service: ENT;  Laterality: N/A;   IMPLANTATION OF HYPOGLOSSAL NERVE STIMULATOR N/A 07/21/2020   Procedure: IMPLANTATION OF HYPOGLOSSAL NERVE STIMULATOR;  Surgeon:  Melida Quitter, MD;  Location: [place];  Service: ENT;  Laterality: N/A;   LEG SURGERY Left    distal   Pace maker placement     PROSTATE SURGERY     PROSTATECTOMY     SKIN CANCER EXCISION  2006,2007   Merkle Cell Carcinoma   Family History:  Family History  Problem Relation Age of Onset   Stroke Father    CAD Paternal Grandmother    CAD Paternal Grandfather    Family Psychiatric  History: See H&P Social History:  Social History   Substance and Sexual Activity  Alcohol Use No   Alcohol/week: 0.0 standard drinks     Social History   Substance and Sexual Activity  Drug Use No    Social History   Socioeconomic History   Marital status: Married    Spouse name: Not on file   Number of children: Not on file   Years of education: Not on file   Highest education level: Not on file  Occupational History   Occupation: disabled  Tobacco Use   Smoking status: Never   Smokeless tobacco: Never  Substance and Sexual Activity   Alcohol use: No    Alcohol/week: 0.0 standard drinks   Drug use: No   Sexual activity: Not on file  Other Topics Concern   Not on file  Social History Narrative   Not on file   Social Determinants of Health   Financial Resource Strain: Not on file  Food Insecurity: Not on file  Transportation Needs: Not  on file  Physical Activity: Not on file  Stress: Not on file  Social Connections: Not on file   Additional Social History:                         Sleep: Fair  Appetite:  Good  Current Medications: Current Facility-Administered Medications  Medication Dose Route Frequency Provider Last Rate Last Admin   acetaminophen (TYLENOL) tablet 650 mg  650 mg Oral Q6H PRN Clapacs, Madie Reno, MD       alum & mag hydroxide-simeth (MAALOX/MYLANTA) 200-200-20 MG/5ML suspension 30 mL  30 mL Oral Q4H PRN Clapacs, Madie Reno, MD       amLODipine (NORVASC) tablet 10 mg  10 mg Oral Daily Clapacs, Madie Reno, MD   10 mg at 03/05/21 0962    atorvastatin (LIPITOR) tablet 40 mg  40 mg Oral 1 day or 1 dose Clapacs, John T, MD   40 mg at 03/04/21 1635   bisacodyl (DULCOLAX) EC tablet 5 mg  5 mg Oral Daily PRN Clapacs, Madie Reno, MD       divalproex (DEPAKOTE) DR tablet 750 mg  750 mg Oral QHS Clapacs, John T, MD   750 mg at 03/04/21 2052   dorzolamide-timolol (COSOPT) 22.3-6.8 MG/ML ophthalmic solution 1 drop  1 drop Right Eye BID Salley Scarlet, MD       gabapentin (NEURONTIN) capsule 300 mg  300 mg Oral BID Clapacs, John T, MD   300 mg at 03/05/21 0928   latanoprost (XALATAN) 0.005 % ophthalmic solution 1 drop  1 drop Right Eye BID Clapacs, Madie Reno, MD   1 drop at 03/05/21 0929   LORazepam (ATIVAN) tablet 1 mg  1 mg Oral Q6H PRN Clapacs, Madie Reno, MD   1 mg at 03/05/21 8366   magnesium hydroxide (MILK OF MAGNESIA) suspension 30 mL  30 mL Oral Daily PRN Clapacs, Madie Reno, MD       pantoprazole (PROTONIX) EC tablet 40 mg  40 mg Oral BID Clapacs, Madie Reno, MD   40 mg at 03/05/21 2947   QUEtiapine (SEROQUEL) tablet 300 mg  300 mg Oral QHS Clapacs, John T, MD   300 mg at 03/04/21 2052    Lab Results:  Results for orders placed or performed during the hospital encounter of 03/03/21 (from the past 48 hour(s))  Basic metabolic panel     Status: Abnormal   Collection Time: 03/04/21  7:36 AM  Result Value Ref Range   Sodium 137 135 - 145 mmol/L   Potassium 3.9 3.5 - 5.1 mmol/L   Chloride 103 98 - 111 mmol/L   CO2 24 22 - 32 mmol/L   Glucose, Bld 111 (H) 70 - 99 mg/dL    Comment: Glucose reference range applies only to samples taken after fasting for at least 8 hours.   BUN 13 8 - 23 mg/dL   Creatinine, Ser 0.68 0.61 - 1.24 mg/dL   Calcium 9.1 8.9 - 10.3 mg/dL   GFR, Estimated >60 >60 mL/min    Comment: (NOTE) Calculated using the CKD-EPI Creatinine Equation (2021)    Anion gap 10 5 - 15    Comment: Performed at [facility], [address] [place], [place] [address]    Blood Alcohol level:  Lab Results  Component Value Date    ETH <10 11/21/2017   ETH <10 03/54/6568    Metabolic Disorder Labs: No results found for: HGBA1C, MPG No results found for: PROLACTIN  Lab Results  Component Value Date   CHOL 205 (H) 09/29/2016   TRIG 101 09/29/2016   HDL 66 09/29/2016   CHOLHDL 3.1 09/29/2016   VLDL 20 09/29/2016   LDLCALC 119 (H) 09/29/2016    Physical Findings: AIMS:  , ,  ,  ,    CIWA:    COWS:     Musculoskeletal: Strength & Muscle Tone: within normal limits Gait & Station: normal Patient leans: N/A  Psychiatric Specialty Exam:  Presentation  General Appearance: Appropriate for Environment  Eye Contact:Fair  Speech:Pressured  Speech Volume:Normal  Handedness:Right   Mood and Affect  Mood:Euphoric  Affect:Congruent   Thought Process  Thought Processes:Disorganized  Descriptions of Associations:Tangential  Orientation:Full (Time, Place and Person)  Thought Content:Delusions; Tangential  History of Schizophrenia/Schizoaffective disorder:No  Duration of Psychotic Symptoms:N/A  Hallucinations:Hallucinations: None  Ideas of Reference:None  Suicidal Thoughts:Suicidal Thoughts: No  Homicidal Thoughts:Homicidal Thoughts: No   Sensorium  Memory:Immediate Fair; Recent Fair; Remote [place]   Executive Functions  Concentration:Poor  Attention Span:Poor  [place]   Psychomotor Activity  Psychomotor Activity:Normal  Assets  Assets:Desire for Improvement; Financial Resources/Insurance; Housing; Intimacy; Resilience; Social Support; Leisure Time   Sleep  Sleep:Fair, 7 hours   Physical Exam: Physical Exam ROS Blood pressure 121/73, pulse 86, temperature (!) 97.5 F (36.4 C), temperature source Oral, resp. rate 18, height 6' (1.829 m), weight 97.5 kg, SpO2 98 %. Body mass index is 29.16 kg/m.   Treatment Plan Summary: Daily contact with patient to assess and evaluate symptoms and  progress in treatment and Medication management 75 year old male presenting with acute mania and agitation.   Bipolar I disorder, current episode manic without psychosis - Continue Depakote 750 mg QHS, Seroquel 300 mg QHS - History of hyponatremia with depakote, Na 137 yesterday. Today's BMP pending. Lipid panel and hemoglobin a1c also pending  HTN - Continue Norvasc 10 mg daily   HLD - Continue Lipitor 40 mg daily, Lipid panel pending  GERD - Continue protonix 40 mg BID  Glaucoma - Continue latanoprost 1 drop, right eye, BID - Restart Cosopt 1 drop right eye BID  Salley Scarlet, MD 03/05/2021, 10:48 AM

## 2021-03-05 NOTE — Group Note (Signed)
LCSW Group Therapy Note  Group Date: 03/05/2021 Start Time: 1330 End Time: 1410   Type of Therapy and Topic:  Group Therapy - Healthy vs Unhealthy Coping Skills  Participation Level:  Active   Description of Group The focus of this group was to determine what unhealthy coping techniques typically are used by group members and what healthy coping techniques would be helpful in coping with various problems. Patients were guided in becoming aware of the differences between healthy and unhealthy coping techniques. Patients were asked to identify 2-3 healthy coping skills they would like to learn to use more effectively.  Therapeutic Goals Patients learned that coping is what human beings do all day long to deal with various situations in their lives Patients defined and discussed healthy vs unhealthy coping techniques Patients identified their preferred coping techniques and identified whether these were healthy or unhealthy Patients determined 2-3 healthy coping skills they would like to become more familiar with and use more often. Patients provided support and ideas to each other   Summary of Patient Progress:  Patient presented to group a few minutes late. Patient was an active participant and engaged in group discussion. Patient shared off topic at times but was easy to redirect and gained apologized for venturing off topic. Patient shared how his low sodium levels led him to admission but did not appear to connect this concern to mental health concerns. Patient did identify when he feels "too big", it is a warning sign that he is experiencing mania. Patient was observed scribbling notes in his composition book throughout group. Patient identified prayer and playing bridge as healthy coping skills.  Therapeutic Modalities Cognitive Behavioral Therapy Motivational Interviewing  Berniece Salines, Latanya Presser 03/05/2021  4:14 PM

## 2021-03-05 NOTE — Progress Notes (Signed)
Pt in dayroom eating breakfast w/ set up assistance only.  Pt thought pattern is grandiose; speech is also  hyper-verbal and tangential in content.  Cont to monitor as ordered.

## 2021-03-05 NOTE — Progress Notes (Signed)
Pt having difficulty staying asleep. He wakes up, hits the call bell and asks , "is time for my morning medication?" Pt got 1mg  of ativan sedation.

## 2021-03-06 DIAGNOSIS — R7303 Prediabetes: Secondary | ICD-10-CM | POA: Diagnosis present

## 2021-03-06 LAB — BASIC METABOLIC PANEL
Anion gap: 8 (ref 5–15)
BUN: 11 mg/dL (ref 8–23)
CO2: 25 mmol/L (ref 22–32)
Calcium: 8.9 mg/dL (ref 8.9–10.3)
Chloride: 102 mmol/L (ref 98–111)
Creatinine, Ser: 0.61 mg/dL (ref 0.61–1.24)
GFR, Estimated: >60 mL/min (ref >60)
Glucose, Bld: 112 mg/dL — ABNORMAL HIGH (ref 70–99)
Potassium: 3.9 mmol/L (ref 3.5–5.1)
Sodium: 135 mmol/L (ref 135–145)

## 2021-03-06 MED ORDER — TRAZODONE HCL 50 MG PO TABS
50.0000 mg | ORAL_TABLET | Freq: Every evening | ORAL | Status: DC | PRN
  Administered 2021-03-06 – 2021-03-07 (×2): 50 mg via ORAL
  Filled 2021-03-06 (×2): qty 1

## 2021-03-06 MED ORDER — ATORVASTATIN CALCIUM 20 MG PO TABS
40.0000 mg | ORAL_TABLET | Freq: Every day | ORAL | Status: DC
  Administered 2021-03-06 – 2021-03-09 (×4): 40 mg via ORAL
  Filled 2021-03-06: qty 2
  Filled 2021-03-06: qty 4
  Filled 2021-03-06: qty 2
  Filled 2021-03-06: qty 4
  Filled 2021-03-06: qty 2
  Filled 2021-03-06: qty 4
  Filled 2021-03-06: qty 2

## 2021-03-06 NOTE — Progress Notes (Signed)
Pt up at 6 am in dayroom with the sitter. He stated he did not remember what happen last night. I explained to him that he was confused, trying to get out of the bed, urinated in the bed and on the floor, plus very unsteady on his feet. He struggles to get out of the bed, and he tries to throw himself out the bed by swing his leg over the side. I elevated the head of the bed and turned the light on in the bathroom so he could see to get to the bathroom. He was informed that he was on falls precaution, but he has short term memory and confusion.

## 2021-03-06 NOTE — Progress Notes (Signed)
[facility] MD Progress Note  03/06/2021 9:05 AM Douglas Perkins.  MRN:  [id number]  CC "I'm great!"  Subjective:  75 year old male prseenting for acute mania and agitation. Overnight patient with increased confusion, urinary incontinence, and wandering. Placed on 1:1 for safety. This morning patient back to baseline orientation and 1:1 discontinued. He continues to be manic on exam with grandiose delusions. Continues to insist on needing to speak to president of the hospital. Denies SI/HI/AH/VH. Sodium 135 this morning.   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[place])    Mural thrombus of cardiac apex    Presence of permanent cardiac pacemaker 2017   SA node dysfunction   Sleep apnea    does not use CPAP   TIA (transient ischemic attack)     Past Surgical History:  Procedure Laterality Date   CHOLECYSTECTOMY     COLONOSCOPY WITH PROPOFOL N/A 01/31/2017   Procedure: COLONOSCOPY WITH PROPOFOL;  Surgeon: Manya Silvas, MD;  Location: [facility] ENDOSCOPY;  Service: Endoscopy;  Laterality: N/A;   DRUG INDUCED ENDOSCOPY N/A 05/12/2020   Procedure: DRUG INDUCED ENDOSCOPY;  Surgeon: Melida Quitter, MD;  Location: [place];  Service: ENT;  Laterality: N/A;   IMPLANTATION OF HYPOGLOSSAL NERVE STIMULATOR N/A 07/21/2020   Procedure: IMPLANTATION OF HYPOGLOSSAL NERVE STIMULATOR;  Surgeon: Melida Quitter, MD;  Location: [place];  Service: ENT;  Laterality: N/A;   LEG SURGERY Left    distal   Pace maker  placement     PROSTATE SURGERY     PROSTATECTOMY     SKIN CANCER EXCISION  2006,2007   Merkle Cell Carcinoma   Family History:  Family History  Problem Relation Age of Onset   Stroke Father    CAD Paternal Grandmother    CAD Paternal Grandfather    Family Psychiatric  History: See H&P Social History:  Social History   Substance and Sexual Activity  Alcohol Use No   Alcohol/week: 0.0 standard drinks     Social History   Substance and Sexual Activity  Drug Use No    Social History   Socioeconomic History   Marital status: Married    Spouse name: Not on file   Number of children: Not on file   Years of education: Not on file   Highest education level: Not on file  Occupational History   Occupation: disabled  Tobacco Use   Smoking status: Never   Smokeless tobacco: Never  Substance and Sexual Activity   Alcohol use: No    Alcohol/week: 0.0 standard drinks   Drug use: No   Sexual activity: Not on file  Other Topics Concern   Not on file  Social History Narrative   Not on file   Social Determinants of Health   Financial Resource Strain: Not on file  Food Insecurity: Not on file  Transportation Needs: Not on file  Physical Activity: Not on file  Stress: Not on file  Social Connections: Not on file   Additional Social History:  Sleep: Fair  Appetite:  Good  Current Medications: Current Facility-Administered Medications  Medication Dose Route Frequency Provider Last Rate Last Admin   acetaminophen (TYLENOL) tablet 650 mg  650 mg Oral Q6H PRN Clapacs, Madie Reno, MD       alum & mag hydroxide-simeth (MAALOX/MYLANTA) 200-200-20 MG/5ML suspension 30 mL  30 mL Oral Q4H PRN Clapacs, Madie Reno, MD       amLODipine (NORVASC) tablet 10 mg  10 mg Oral Daily Clapacs, Madie Reno, MD   10 mg at 03/05/21 4332   atorvastatin (LIPITOR) tablet 40 mg  40 mg Oral 1 day or 1 dose Clapacs, John T, MD   40 mg at 03/05/21 1537   bisacodyl (DULCOLAX) EC  tablet 5 mg  5 mg Oral Daily PRN Clapacs, Madie Reno, MD       divalproex (DEPAKOTE) DR tablet 750 mg  750 mg Oral QHS Clapacs, John T, MD   750 mg at 03/05/21 2056   dorzolamide (TRUSOPT) 2 % ophthalmic solution 1 drop  1 drop Right Eye BID Clapacs, Madie Reno, MD   1 drop at 03/05/21 2106   And   timolol (TIMOPTIC) 0.5 % ophthalmic solution 1 drop  1 drop Right Eye BID Clapacs, Madie Reno, MD   1 drop at 03/05/21 2106   gabapentin (NEURONTIN) capsule 300 mg  300 mg Oral BID Clapacs, John T, MD   300 mg at 03/05/21 2105   latanoprost (XALATAN) 0.005 % ophthalmic solution 1 drop  1 drop Right Eye BID Clapacs, Madie Reno, MD   1 drop at 03/05/21 2107   LORazepam (ATIVAN) tablet 1 mg  1 mg Oral Q6H PRN Clapacs, Madie Reno, MD   1 mg at 03/05/21 2326   magnesium hydroxide (MILK OF MAGNESIA) suspension 30 mL  30 mL Oral Daily PRN Clapacs, Madie Reno, MD       pantoprazole (PROTONIX) EC tablet 40 mg  40 mg Oral BID Clapacs, John T, MD   40 mg at 03/05/21 2059   QUEtiapine (SEROQUEL) tablet 300 mg  300 mg Oral QHS Clapacs, John T, MD   300 mg at 03/05/21 2058   traZODone (DESYREL) tablet 50 mg  50 mg Oral QHS PRN Salley Scarlet, MD        Lab Results:  Results for orders placed or performed during the hospital encounter of 03/03/21 (from the past 48 hour(s))  Basic metabolic panel     Status: Abnormal   Collection Time: 03/05/21  6:44 AM  Result Value Ref Range   Sodium 132 (L) 135 - 145 mmol/L   Potassium 4.2 3.5 - 5.1 mmol/L   Chloride 100 98 - 111 mmol/L   CO2 24 22 - 32 mmol/L   Glucose, Bld 110 (H) 70 - 99 mg/dL    Comment: Glucose reference range applies only to samples taken after fasting for at least 8 hours.   BUN 13 8 - 23 mg/dL   Creatinine, Ser 0.80 0.61 - 1.24 mg/dL   Calcium 8.9 8.9 - 10.3 mg/dL   GFR, Estimated >60 >60 mL/min    Comment: (NOTE) Calculated using the CKD-EPI Creatinine Equation (2021)    Anion gap 8 5 - 15    Comment: Performed at [facility], [address]  [place], [place] [address]  Hemoglobin A1c     Status: Abnormal   Collection Time: 03/05/21 11:14 AM  Result Value Ref Range   Hgb A1c MFr Bld 5.7 (H) 4.8 - 5.6 %  Comment: (NOTE) Pre diabetes:          5.7%-6.4%  Diabetes:              >6.4%  Glycemic control for   <7.0% adults with diabetes    Mean Plasma Glucose 116.89 mg/dL    Comment: Performed at [facility] [address], [place], [place] [address]  Lipid panel     Status: None   Collection Time: 03/05/21 11:14 AM  Result Value Ref Range   Cholesterol 116 0 - 200 mg/dL   Triglycerides 66 <150 mg/dL   HDL 49 >40 mg/dL   Total CHOL/HDL Ratio 2.4 RATIO   VLDL 13 0 - 40 mg/dL   LDL Cholesterol 54 0 - 99 mg/dL    Comment:        Total Cholesterol/HDL:CHD Risk Coronary Heart Disease Risk Table                     Men   Women  1/2 Average Risk   3.4   3.3  Average Risk       5.0   4.4  2 X Average Risk   9.6   7.1  3 X Average Risk  23.4   11.0        Use the calculated Patient Ratio above and the CHD Risk Table to determine the patient's CHD Risk.        ATP III CLASSIFICATION (LDL):  <100     mg/dL   Optimal  100-129  mg/dL   Near or Above                    Optimal  130-159  mg/dL   Borderline  160-189  mg/dL   High  >190     mg/dL   Very High Performed at [facility], [address] [place], [place] [address]   Basic metabolic panel     Status: Abnormal   Collection Time: 03/06/21  6:24 AM  Result Value Ref Range   Sodium 135 135 - 145 mmol/L   Potassium 3.9 3.5 - 5.1 mmol/L   Chloride 102 98 - 111 mmol/L   CO2 25 22 - 32 mmol/L   Glucose, Bld 112 (H) 70 - 99 mg/dL    Comment: Glucose reference range applies only to samples taken after fasting for at least 8 hours.   BUN 11 8 - 23 mg/dL   Creatinine, Ser 0.61 0.61 - 1.24 mg/dL   Calcium 8.9 8.9 - 10.3 mg/dL   GFR, Estimated >60 >60 mL/min    Comment: (NOTE) Calculated using the CKD-EPI Creatinine Equation (2021)    Anion gap 8 5 - 15     Comment: Performed at [facility], [address] [place], [place] [address]    Blood Alcohol level:  Lab Results  Component Value Date   [facility] <10 11/21/2017   ETH <10 40/97/3532    Metabolic Disorder Labs: Lab Results  Component Value Date   HGBA1C 5.7 (H) 03/05/2021   MPG 116.89 03/05/2021   No results found for: PROLACTIN Lab Results  Component Value Date   CHOL 116 03/05/2021   TRIG 66 03/05/2021   HDL 49 03/05/2021   CHOLHDL 2.4 03/05/2021   VLDL 13 03/05/2021   LDLCALC 54 03/05/2021   LDLCALC 119 (H) 09/29/2016    Physical Findings: AIMS:  , ,  ,  ,    CIWA:    COWS:  Musculoskeletal: Strength & Muscle Tone: within normal limits Gait & Station: normal Patient leans: N/A  Psychiatric Specialty Exam:  Presentation  General Appearance: Appropriate for Environment  Eye Contact:Fair  Speech:Pressured  Speech Volume:Normal  Handedness:Right   Mood and Affect  Mood:Euphoric  Affect:Congruent   Thought Process  Thought Processes:Disorganized  Descriptions of Associations:Tangential  Orientation:Full (Time, Place and Person)  Thought Content:Delusions; Tangential  History of Schizophrenia/Schizoaffective disorder:No  Duration of Psychotic Symptoms:N/A  Hallucinations:Denies  Ideas of Reference:None  Suicidal Thoughts:Denies  Homicidal Thoughts:Denies   Sensorium  Memory:Immediate Fair; Recent Fair; Remote Fair  Judgment:Impaired  Insight:Present   Executive Functions  Concentration:Poor  Attention Span:Poor  [place]   Psychomotor Activity  Psychomotor Activity:Normal  Assets  Assets:Desire for Improvement; Financial Resources/Insurance; Housing; Intimacy; Resilience; Social Support; Leisure Time   Sleep  Sleep:Poor   Physical Exam: Physical Exam ROS Blood pressure 127/72, pulse 87, temperature 98.7 F (37.1 C), temperature source Oral, resp. rate  18, height 6' (1.829 m), weight 97.5 kg, SpO2 99 %. Body mass index is 29.16 kg/m.   Treatment Plan Summary: Daily contact with patient to assess and evaluate symptoms and progress in treatment and Medication management 75 year old male presenting with acute mania and agitation.   Bipolar I disorder, current episode manic without psychosis - Continue Depakote 750 mg QHS, Seroquel 300 mg QHS - VPA level tomorrow night - History of hyponatremia with depakote, Na 135 today  - Lipid panel WNL, Hemogobin 5.7- prediabetic  HTN - Continue Norvasc 10 mg daily   HLD - Continue Lipitor 40 mg daily, Lipid panel WNL  GERD - Continue protonix 40 mg BID  Glaucoma - Continue latanoprost 1 drop, right eye, BID - Continue Cosopt 1 drop right eye BID  Prediabetes - Dietary counseling   Salley Scarlet, MD 03/06/2021, 9:05 AM

## 2021-03-06 NOTE — Progress Notes (Signed)
During assessment pt is oriented to self only, he is not aware of situation.  He stated "I'm in the hospital to have some test run.  I have a meeting with the president of the hospital Monday morning at 11am, make sure I'm awake so I have time to get ready."  Pt was awake and ate 100% of breakfast and is watching church services on the tv. Pt is not able to answer assessment questions in an appropriate manor.

## 2021-03-06 NOTE — Progress Notes (Signed)
Pt is a very restless sleeper, tossing, turning and talking in his sleep. He was up to the bathroom several times.Marland Kitchen

## 2021-03-06 NOTE — Progress Notes (Signed)
Douglas Perkins continues to be grandiose in thought content. He insists that he has a meeting with the president of [facility] at 11 a.m. Monday.  He stated that "I have a lot of friends in very high places."  Douglas Perkins has to be redirected several times as he attempts to horde garbage (used napkins, styrofoam cups, etc) by collecting them in a corner of the day room or stuffing them in his pockets.

## 2021-03-06 NOTE — Progress Notes (Signed)
Hospital meal provided, pt tolerated w/o complaints.  Waste discarded appropriately.  

## 2021-03-06 NOTE — Progress Notes (Addendum)
Pt is now on a 1:1 for falls precaution, due difficulty getting out of bed and walking in urination secondary to incontinence. RN came in the room and the patient was struggling to get out of bed, trying to get to the bathroom. Pt urinated in the bed, and it took 3 staff to clean him up and get him into the bathroom. The room and bathroom floor was wet with urine, creating an increased falls risk. The on call was notified and 1:1 order was put in place. Pt has on yellow sock and falls bracelet.

## 2021-03-06 NOTE — Progress Notes (Signed)
Hospital meal provided set up assistance required, pt tolerated w/o complaints.  Waste discarded appropriately.

## 2021-03-06 NOTE — Progress Notes (Signed)
Pt went to outside group held by NT's.  When coming inside pt became agitated when finding we cleaned the area of the dayroom where he sits.  Pt was given a PRN and a snack then calmed down.

## 2021-03-06 NOTE — Progress Notes (Signed)
Patient was in the dayroom interacting with visitor at the beginning of the shift. I escorted his wife off of the unit and Pt went to his room. Pt endorses grandiose delusions stating that he is "going to speak with the president of the hospital tomorrow morning and tell him that we are doing a great job." Pt is assertive and has a loud tone of voice but is not aggressive. Pt denies SI, HI, AH and VH at this time. Pt is a high fall risk and uses a front wheel walker. Pt will be monitored q 15 miuntes for safety.

## 2021-03-07 LAB — BASIC METABOLIC PANEL
Anion gap: 9 (ref 5–15)
BUN: 12 mg/dL (ref 8–23)
CO2: 25 mmol/L (ref 22–32)
Calcium: 9.2 mg/dL (ref 8.9–10.3)
Chloride: 103 mmol/L (ref 98–111)
Creatinine, Ser: 0.66 mg/dL (ref 0.61–1.24)
GFR, Estimated: >60 mL/min (ref >60)
Glucose, Bld: 112 mg/dL — ABNORMAL HIGH (ref 70–99)
Potassium: 4.1 mmol/L (ref 3.5–5.1)
Sodium: 137 mmol/L (ref 135–145)

## 2021-03-07 LAB — VALPROIC ACID LEVEL: Valproic Acid Lvl: 26 ug/mL — ABNORMAL LOW (ref 50.0–100.0)

## 2021-03-07 MED ORDER — METOPROLOL SUCCINATE ER 25 MG PO TB24
25.0000 mg | ORAL_TABLET | Freq: Every day | ORAL | Status: DC
  Administered 2021-03-07 – 2021-03-09 (×3): 25 mg via ORAL
  Filled 2021-03-07 (×3): qty 1

## 2021-03-07 NOTE — Progress Notes (Signed)
Patient gets up about every hour to use the bathroom throughout the night. Patient is also urine incontinent and wears gray briefs. Pt has urgency and sometimes does not make it to the bathroom in time leaving trickles of pee on the floor making him a fall risk. I suggest pt have a sitter in the room while he sleeps on night shift to prevent falls.

## 2021-03-07 NOTE — Progress Notes (Signed)
[facility] MD Progress Note  03/07/2021 11:33 AM Douglas Perkins.  MRN:  [id number] Subjective: Follow-up 75 year old man with bipolar disorder.  Patient seen.  He says he is feeling better.  Slept well last night.  Still has some racing thoughts and is noted by staff to be hyperverbal and a bit grandiose but has not been aggressive threatening or violent.  No new physical complaints Principal Problem: Bipolar affective disorder, current episode manic ([place]) Diagnosis: Principal Problem:   Bipolar affective disorder, current episode manic ([place]) Active Problems:   Benign essential hypertension   Hyperlipidemia, mixed   Hyponatremia syndrome   Prediabetes  Total Time spent with patient: 30 minutes  Past Psychiatric History: Past history of bipolar disorder complicated by hyponatremia with some medicines  Past Medical History:  Past Medical History:  Diagnosis Date   Bipolar 1 disorder ([place])    Cancer ([place])    prostate   Glaucoma    Hypertension    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[place])    Mural thrombus of cardiac apex    Presence of permanent cardiac pacemaker 2017   SA node dysfunction   Sleep apnea    does not use CPAP   TIA (transient ischemic attack)     Past Surgical History:  Procedure Laterality Date   CHOLECYSTECTOMY     COLONOSCOPY WITH PROPOFOL N/A 01/31/2017   Procedure: COLONOSCOPY WITH PROPOFOL;  Surgeon: Manya Silvas, MD;  Location: [facility];  Service: Endoscopy;  Laterality: N/A;   DRUG INDUCED ENDOSCOPY N/A 05/12/2020   Procedure: DRUG INDUCED ENDOSCOPY;  Surgeon: Melida Quitter, MD;  Location: [place];  Service: ENT;  Laterality: N/A;   IMPLANTATION OF HYPOGLOSSAL NERVE STIMULATOR N/A 07/21/2020   Procedure: IMPLANTATION OF HYPOGLOSSAL NERVE STIMULATOR;  Surgeon: Melida Quitter, MD;  Location: [place];  Service: ENT;  Laterality: N/A;   LEG SURGERY Left    distal   Pace maker placement     PROSTATE SURGERY     PROSTATECTOMY      SKIN CANCER EXCISION  2006,2007   Merkle Cell Carcinoma   Family History:  Family History  Problem Relation Age of Onset   Stroke Father    CAD Paternal Grandmother    CAD Paternal Grandfather    Family Psychiatric  History: See previous Social History:  Social History   Substance and Sexual Activity  Alcohol Use No   Alcohol/week: 0.0 standard drinks     Social History   Substance and Sexual Activity  Drug Use No    Social History   Socioeconomic History   Marital status: Married    Spouse name: Not on file   Number of children: Not on file   Years of education: Not on file   Highest education level: Not on file  Occupational History   Occupation: disabled  Tobacco Use   Smoking status: Never   Smokeless tobacco: Never  Substance and Sexual Activity   Alcohol use: No    Alcohol/week: 0.0 standard drinks   Drug use: No   Sexual activity: Not on file  Other Topics Concern   Not on file  Social History Narrative   Not on file   Social Determinants of Health   Financial Resource Strain: Not on file  Food Insecurity: Not on file  Transportation Needs: Not on file  Physical Activity: Not on file  Stress: Not on file  Social Connections: Not on file   Additional Social History:  Sleep: Fair  Appetite:  Fair  Current Medications: Current Facility-Administered Medications  Medication Dose Route Frequency Provider Last Rate Last Admin   acetaminophen (TYLENOL) tablet 650 mg  650 mg Oral Q6H PRN Daanish Copes, Madie Reno, MD       alum & mag hydroxide-simeth (MAALOX/MYLANTA) 200-200-20 MG/5ML suspension 30 mL  30 mL Oral Q4H PRN Lashane Whelpley, Madie Reno, MD       amLODipine (NORVASC) tablet 10 mg  10 mg Oral Daily Damari Suastegui T, MD   10 mg at 03/07/21 0908   atorvastatin (LIPITOR) tablet 40 mg  40 mg Oral Daily Dorothe Pea, RPH   40 mg at 03/07/21 0908   bisacodyl (DULCOLAX) EC tablet 5 mg  5 mg Oral Daily PRN Shauntell Iglesia, Madie Reno, MD        divalproex (DEPAKOTE) DR tablet 750 mg  750 mg Oral QHS Isobella Ascher T, MD   750 mg at 03/06/21 2056   dorzolamide (TRUSOPT) 2 % ophthalmic solution 1 drop  1 drop Right Eye BID [address], Madie Reno, MD   1 drop at 03/07/21 0910   And   timolol (TIMOPTIC) 0.5 % ophthalmic solution 1 drop  1 drop Right Eye BID Cornelio Parkerson, Madie Reno, MD   1 drop at 03/07/21 0910   gabapentin (NEURONTIN) capsule 300 mg  300 mg Oral BID [place] Graffam T, MD   300 mg at 03/07/21 0908   latanoprost (XALATAN) 0.005 % ophthalmic solution 1 drop  1 drop Right Eye BID Laronda Lisby, Madie Reno, MD   1 drop at 03/07/21 0911   LORazepam (ATIVAN) tablet 1 mg  1 mg Oral Q6H PRN Dawood Spitler, Madie Reno, MD   1 mg at 03/06/21 1356   magnesium hydroxide (MILK OF MAGNESIA) suspension 30 mL  30 mL Oral Daily PRN Aviella Disbrow, Madie Reno, MD       pantoprazole (PROTONIX) EC tablet 40 mg  40 mg Oral BID Layonna Dobie T, MD   40 mg at 03/07/21 0908   QUEtiapine (SEROQUEL) tablet 300 mg  300 mg Oral QHS Sharryn Belding T, MD   300 mg at 03/06/21 2056   traZODone (DESYREL) tablet 50 mg  50 mg Oral QHS PRN Salley Scarlet, MD   50 mg at 03/06/21 2056    Lab Results:  Results for orders placed or performed during the hospital encounter of 03/03/21 (from the past 48 hour(s))  Basic metabolic panel     Status: Abnormal   Collection Time: 03/06/21  6:24 AM  Result Value Ref Range   Sodium 135 135 - 145 mmol/L   Potassium 3.9 3.5 - 5.1 mmol/L   Chloride 102 98 - 111 mmol/L   CO2 25 22 - 32 mmol/L   Glucose, Bld 112 (H) 70 - 99 mg/dL    Comment: Glucose reference range applies only to samples taken after fasting for at least 8 hours.   BUN 11 8 - 23 mg/dL   Creatinine, Ser 0.61 0.61 - 1.24 mg/dL   Calcium 8.9 8.9 - 10.3 mg/dL   GFR, Estimated >60 >60 mL/min    Comment: (NOTE) Calculated using the CKD-EPI Creatinine Equation (2021)    Anion gap 8 5 - 15    Comment: Performed at [facility], [address] [place], [place] [address]  Basic metabolic panel      Status: Abnormal   Collection Time: 03/07/21  5:59 AM  Result Value Ref Range   Sodium 137 135 - 145 mmol/L   Potassium 4.1  3.5 - 5.1 mmol/L   Chloride 103 98 - 111 mmol/L   CO2 25 22 - 32 mmol/L   Glucose, Bld 112 (H) 70 - 99 mg/dL    Comment: Glucose reference range applies only to samples taken after fasting for at least 8 hours.   BUN 12 8 - 23 mg/dL   Creatinine, Ser 0.66 0.61 - 1.24 mg/dL   Calcium 9.2 8.9 - 10.3 mg/dL   GFR, Estimated >60 >60 mL/min    Comment: (NOTE) Calculated using the CKD-EPI Creatinine Equation (2021)    Anion gap 9 5 - 15    Comment: Performed at [facility], [address] [place], [place] [address]    Blood Alcohol level:  Lab Results  Component Value Date   [facility] <10 11/21/2017   ETH <10 98/92/1194    Metabolic Disorder Labs: Lab Results  Component Value Date   HGBA1C 5.7 (H) 03/05/2021   MPG 116.89 03/05/2021   No results found for: PROLACTIN Lab Results  Component Value Date   CHOL 116 03/05/2021   TRIG 66 03/05/2021   HDL 49 03/05/2021   CHOLHDL 2.4 03/05/2021   VLDL 13 03/05/2021   LDLCALC 54 03/05/2021   LDLCALC 119 (H) 09/29/2016    Physical Findings: AIMS:  , ,  ,  ,    CIWA:    COWS:     Musculoskeletal: Strength & Muscle Tone: within normal limits Gait & Station: normal Patient leans: N/A  Psychiatric Specialty Exam:  Presentation  General Appearance: Appropriate for Environment  Eye Contact:Fair  Speech:Pressured  Speech Volume:Normal  Handedness:Right   Mood and Affect  Mood:Euphoric  Affect:Congruent   Thought Process  Thought Processes:Disorganized  Descriptions of Associations:Tangential  Orientation:Full (Time, Place and Person)  Thought Content:Delusions; Tangential  History of Schizophrenia/Schizoaffective disorder:No  Duration of Psychotic Symptoms:N/A  Hallucinations:No data recorded Ideas of Reference:None  Suicidal Thoughts:No data recorded Homicidal Thoughts:No  data recorded  Sensorium  Memory:Immediate Fair; Recent Fair; Remote [place]   Executive Functions  Concentration:Poor  Attention Span:Poor  Colon   Psychomotor Activity  Psychomotor Activity:No data recorded  Assets  Assets:Desire for Improvement; Financial Resources/Insurance; Housing; Intimacy; Resilience; Social Support; Leisure Time   Sleep  Sleep:No data recorded   Physical Exam: Physical Exam Vitals and nursing note reviewed.  Constitutional:      Appearance: Normal appearance.  HENT:     Head: Normocephalic and atraumatic.     Mouth/Throat:     Pharynx: Oropharynx is clear.  Eyes:     Pupils: Pupils are equal, round, and reactive to light.  Cardiovascular:     Rate and Rhythm: Normal rate and regular rhythm.  Pulmonary:     Effort: Pulmonary effort is normal.     Breath sounds: Normal breath sounds.  Abdominal:     General: Abdomen is flat.     Palpations: Abdomen is soft.  Musculoskeletal:        General: Normal range of motion.  Skin:    General: Skin is warm and dry.  Neurological:     General: No focal deficit present.     Mental Status: He is alert. Mental status is at baseline.  Psychiatric:        Attention and Perception: Attention normal.        Mood and Affect: Mood is elated.        Speech: Speech is rapid and pressured.  Behavior: Behavior is cooperative.        Thought Content: Thought content normal.        Cognition and Memory: Cognition normal.        Judgment: Judgment is impulsive.   Review of Systems  Constitutional: Negative.   HENT: Negative.    Eyes: Negative.   Respiratory: Negative.    Cardiovascular: Negative.   Gastrointestinal: Negative.   Musculoskeletal: Negative.   Skin: Negative.   Neurological: Negative.   Psychiatric/Behavioral: Negative.    Blood pressure (!) 163/96, pulse (!) 123, temperature 98.2 F (36.8 C),  temperature source Oral, resp. rate 18, height 6' (1.829 m), weight 97.5 kg, SpO2 98 %. Body mass index is 29.16 kg/m.   Treatment Plan Summary: Plan continue current medications with Depakote.  So far sodium is stable.  Recheck sodium tomorrow.  Depakote level tonight.  Patient may be getting within a day or 2 of being ready for discharge.  Blood pressure is noted to be a little elevated this morning and medications may need adjustment for that.  Alethia Berthold, MD 03/07/2021, 11:33 AM

## 2021-03-07 NOTE — Progress Notes (Signed)
Patient is alert and oriented to person, place, and time, but disoriented to situation. He has grandiose delusions and talks about speaking with the president of the hospital to "tell him we are doing a good job". He denies SI, HI, and AVH. Patient appetite is good. He is sitting in the dayroom and appears less restless compared to previous encounters. Patient is compliant with medications. Support and encouragement provided. Patient remains safe on the unit at this time.

## 2021-03-07 NOTE — Plan of Care (Signed)
  Problem: Coping: Goal: Ability to verbalize frustrations and anger appropriately will improve Outcome: Progressing Goal: Ability to demonstrate self-control will improve Outcome: Progressing

## 2021-03-07 NOTE — Group Note (Addendum)
[facility] LCSW Group Therapy Note    Group Date: 03/07/2021 Start Time: 2010 End Time: 1405  Type of Therapy and Topic:  Group Therapy:  Overcoming Obstacles  Participation Level:  [facility] PARTICIPATION LEVEL: Active    Description of Group:   In this group patients will be encouraged to explore what they see as obstacles to their own wellness and recovery. They will be guided to discuss their thoughts, feelings, and behaviors related to these obstacles. The group will process together ways to cope with barriers, with attention given to specific choices patients can make. Each patient will be challenged to identify changes they are motivated to make in order to overcome their obstacles. This group will be process-oriented, with patients participating in exploration of their own experiences as well as giving and receiving support and challenge from other group members.  Therapeutic Goals: 1. Patient will identify personal and current obstacles as they relate to admission. 2. Patient will identify barriers that currently interfere with their wellness or overcoming obstacles.  3. Patient will identify feelings, thought process and behaviors related to these barriers. 4. Patient will identify two changes they are willing to make to overcome these obstacles:    Summary of Patient Progress   Patient was present for the entirety of the group session. Patient was an active listener and participated in the topic of discussion.Group was held outside in the courtyard to utilize coping skills to help deal with and discuss strategies for overcoming internal and external challenges. Patient utilized music and movement as a method for positive mood adjustment. 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 Group ended early due to patient becoming overly warm.       [place],  LCSWA

## 2021-03-07 NOTE — Progress Notes (Signed)
Recreation Therapy Notes    Date: 03/07/2021  Time: 1:45 pm   Location: [address] [place]    Behavioral response: Appropriate  Intervention Topic: Emotions    Discussion/Intervention:  Group content on today was focused on emotions. The group identified what emotions are and why it is important to have emotions. Patients expressed some positive and negative emotions. Individuals gave some past experiences on how they normally dealt with emotions in the past. The group described some positive ways to deal with emotions in the future. Patients participated in the intervention "Name the Jerl Santos" where individuals were given a chance to experience different emotions.  Clinical Observations/Feedback: Patient came to group and identified happiness as a common emotion for him. He stated that he is mainly happy when he is with his wife, listening to music, sitting outside and dancing. Individual was social with staff while participating in the intervention.  Latiesha Harada LRT/CTRS         Jolea Dolle 03/07/2021 3:59 PM

## 2021-03-08 LAB — BASIC METABOLIC PANEL
Anion gap: 8 (ref 5–15)
BUN: 11 mg/dL (ref 8–23)
CO2: 26 mmol/L (ref 22–32)
Calcium: 9.2 mg/dL (ref 8.9–10.3)
Chloride: 102 mmol/L (ref 98–111)
Creatinine, Ser: 0.71 mg/dL (ref 0.61–1.24)
GFR, Estimated: >60 mL/min (ref >60)
Glucose, Bld: 104 mg/dL — ABNORMAL HIGH (ref 70–99)
Potassium: 4.3 mmol/L (ref 3.5–5.1)
Sodium: 136 mmol/L (ref 135–145)

## 2021-03-08 MED ORDER — DIVALPROEX SODIUM 250 MG PO DR TAB
1000.0000 mg | DELAYED_RELEASE_TABLET | Freq: Every day | ORAL | Status: DC
  Administered 2021-03-08: 1000 mg via ORAL
  Filled 2021-03-08: qty 4

## 2021-03-08 NOTE — Group Note (Signed)
[facility] LCSW Group Therapy Note   Group Date: 03/08/2021 Start Time: 6256 End Time: 1345  Type of Therapy/Topic:  Group Therapy:  Feelings about Diagnosis  Participation Level:  Active      Description of Group:    This group will allow patients to explore their thoughts and feelings about diagnoses they have received. Patients will be guided to explore their level of understanding and acceptance of these diagnoses. Facilitator will encourage patients to process their thoughts and feelings about the reactions of others to their diagnosis, and will guide patients in identifying ways to discuss their diagnosis with significant others in their lives. This group will be process-oriented, with patients participating in exploration of their own experiences as well as giving and receiving support and challenge from other group members.   Therapeutic Goals: 1. Patient will demonstrate understanding of diagnosis as evidence by identifying two or more symptoms of the disorder:  2. Patient will be able to express two feelings regarding the diagnosis 3. Patient will demonstrate ability to communicate their needs through discussion and/or role plays  Summary of Patient Progress:  Patient was present for the entirety of the group session. Patient was an active listener and participated in the topic of discussion, provided helpful advice to others, and added nuance to topic of conversation. Group was held in the courtyard but moved to the craft room due to the weather being too cold. Patient exhibited a pleasant demeanor. He stated that he is feeling "less manic and more even." He stated that the medication is helping as well as participating in the activities and the level of care he has received.       Therapeutic Modalities:   Cognitive Behavioral Therapy Brief Therapy Feelings Identification    Gill Delrossi A [place], LCSWA

## 2021-03-08 NOTE — Progress Notes (Signed)
[facility] MD Progress Note  03/08/2021 2:27 PM Douglas Perkins.  MRN:  [id number] Subjective: Follow-up for this 75 year old man with mania.  Patient has no complaints.  He feels like he is doing much better.  He is able to hold a conversation without pressured speech and stay focused on topic.  He sits still for extended periods on the unit.  Not showing any inappropriate or agitated behavior.  Cooperative with medication.  No report of any side effects.  Sodium continues to be stable.  Last night's Depakote level was low at 27 Principal Problem: Bipolar affective disorder, current episode manic ([place]) Diagnosis: Principal Problem:   Bipolar affective disorder, current episode manic ([place]) Active Problems:   Benign essential hypertension   Hyperlipidemia, mixed   Hyponatremia syndrome   Prediabetes  Total Time spent with patient: 30 minutes  Past Psychiatric History: Past history of bipolar disorder previously did well on mood stabilizers and Seroquel but has struggled somewhat with his sodium control  Past Medical History:  Past Medical History:  Diagnosis Date   Bipolar 1 disorder ([place])    Cancer ([place])    prostate   Glaucoma    Hypertension    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[place])    Mural thrombus of cardiac apex    Presence of permanent cardiac pacemaker 2017   SA node dysfunction   Sleep apnea    does not use CPAP   TIA (transient ischemic attack)     Past Surgical History:  Procedure Laterality Date   CHOLECYSTECTOMY     COLONOSCOPY WITH PROPOFOL N/A 01/31/2017   Procedure: COLONOSCOPY WITH PROPOFOL;  Surgeon: Manya Silvas, MD;  Location: [facility] ENDOSCOPY;  Service: Endoscopy;  Laterality: N/A;   DRUG INDUCED ENDOSCOPY N/A 05/12/2020   Procedure: DRUG INDUCED ENDOSCOPY;  Surgeon: Melida Quitter, MD;  Location: [place];  Service: ENT;  Laterality: N/A;   IMPLANTATION OF HYPOGLOSSAL NERVE STIMULATOR N/A 07/21/2020   Procedure: IMPLANTATION OF HYPOGLOSSAL NERVE STIMULATOR;   Surgeon: Melida Quitter, MD;  Location: [place];  Service: ENT;  Laterality: N/A;   LEG SURGERY Left    distal   Pace maker placement     PROSTATE SURGERY     PROSTATECTOMY     SKIN CANCER EXCISION  2006,2007   Merkle Cell Carcinoma   Family History:  Family History  Problem Relation Age of Onset   Stroke Father    CAD Paternal Grandmother    CAD Paternal Grandfather    Family Psychiatric  History: See previous Social History:  Social History   Substance and Sexual Activity  Alcohol Use No   Alcohol/week: 0.0 standard drinks     Social History   Substance and Sexual Activity  Drug Use No    Social History   Socioeconomic History   Marital status: Married    Spouse name: Not on file   Number of children: Not on file   Years of education: Not on file   Highest education level: Not on file  Occupational History   Occupation: disabled  Tobacco Use   Smoking status: Never   Smokeless tobacco: Never  Substance and Sexual Activity   Alcohol use: No    Alcohol/week: 0.0 standard drinks   Drug use: No   Sexual activity: Not on file  Other Topics Concern   Not on file  Social History Narrative   Not on file   Social Determinants of Health   Financial Resource Strain: Not on file  Food Insecurity: Not on file  Transportation Needs: Not on file  Physical Activity: Not on file  Stress: Not on file  Social Connections: Not on file   Additional Social History:                         Sleep: Fair  Appetite:  Fair  Current Medications: Current Facility-Administered Medications  Medication Dose Route Frequency Provider Last Rate Last Admin   acetaminophen (TYLENOL) tablet 650 mg  650 mg Oral Q6H PRN Daiki Dicostanzo, Madie Reno, MD       alum & mag hydroxide-simeth (MAALOX/MYLANTA) 200-200-20 MG/5ML suspension 30 mL  30 mL Oral Q4H PRN Jozlyn Schatz, Madie Reno, MD       amLODipine (NORVASC) tablet 10 mg  10 mg Oral Daily Vincient Vanaman T, MD   10 mg at  03/08/21 0918   atorvastatin (LIPITOR) tablet 40 mg  40 mg Oral Daily Dorothe Pea, RPH   40 mg at 03/08/21 7939   bisacodyl (DULCOLAX) EC tablet 5 mg  5 mg Oral Daily PRN Keaun Schnabel, Madie Reno, MD       divalproex (DEPAKOTE) DR tablet 1,000 mg  1,000 mg Oral QHS Cilicia Borden T, MD       dorzolamide (TRUSOPT) 2 % ophthalmic solution 1 drop  1 drop Right Eye BID [place] Nies, Madie Reno, MD   1 drop at 03/08/21 0920   And   timolol (TIMOPTIC) 0.5 % ophthalmic solution 1 drop  1 drop Right Eye BID Rachell Druckenmiller, Madie Reno, MD   1 drop at 03/08/21 0919   gabapentin (NEURONTIN) capsule 300 mg  300 mg Oral BID Logyn Kendrick T, MD   300 mg at 03/08/21 0918   latanoprost (XALATAN) 0.005 % ophthalmic solution 1 drop  1 drop Right Eye BID Araseli Sherry, Madie Reno, MD   1 drop at 03/08/21 0920   LORazepam (ATIVAN) tablet 1 mg  1 mg Oral Q6H PRN Zarriah Starkel, Madie Reno, MD   1 mg at 03/08/21 1100   magnesium hydroxide (MILK OF MAGNESIA) suspension 30 mL  30 mL Oral Daily PRN Laniya Friedl, Madie Reno, MD       metoprolol succinate (TOPROL-XL) 24 hr tablet 25 mg  25 mg Oral Daily Mele Sylvester T, MD   25 mg at 03/08/21 0918   pantoprazole (PROTONIX) EC tablet 40 mg  40 mg Oral BID Shemica Meath T, MD   40 mg at 03/08/21 0918   QUEtiapine (SEROQUEL) tablet 300 mg  300 mg Oral QHS Paulett Kaufhold T, MD   300 mg at 03/07/21 2047   traZODone (DESYREL) tablet 50 mg  50 mg Oral QHS PRN Salley Scarlet, MD   50 mg at 03/07/21 2047    Lab Results:  Results for orders placed or performed during the hospital encounter of 03/03/21 (from the past 48 hour(s))  Basic metabolic panel     Status: Abnormal   Collection Time: 03/07/21  5:59 AM  Result Value Ref Range   Sodium 137 135 - 145 mmol/L   Potassium 4.1 3.5 - 5.1 mmol/L   Chloride 103 98 - 111 mmol/L   CO2 25 22 - 32 mmol/L   Glucose, Bld 112 (H) 70 - 99 mg/dL    Comment: Glucose reference range applies only to samples taken after fasting for at least 8 hours.   BUN 12 8 - 23 mg/dL   Creatinine, Ser 0.66  0.61 - 1.24 mg/dL   Calcium 9.2 8.9 - 10.3  mg/dL   GFR, Estimated >60 >60 mL/min    Comment: (NOTE) Calculated using the CKD-EPI Creatinine Equation (2021)    Anion gap 9 5 - 15    Comment: Performed at [facility], [address] [place], [place] [address]  Valproic acid level     Status: Abnormal   Collection Time: 03/07/21  7:46 PM  Result Value Ref Range   Valproic Acid Lvl 26 (L) 50.0 - 100.0 ug/mL    Comment: Performed at [facility], [address], [place], [place] [address]  Basic metabolic panel     Status: Abnormal   Collection Time: 03/08/21  7:06 AM  Result Value Ref Range   Sodium 136 135 - 145 mmol/L   Potassium 4.3 3.5 - 5.1 mmol/L   Chloride 102 98 - 111 mmol/L   CO2 26 22 - 32 mmol/L   Glucose, Bld 104 (H) 70 - 99 mg/dL    Comment: Glucose reference range applies only to samples taken after fasting for at least 8 hours.   BUN 11 8 - 23 mg/dL   Creatinine, Ser 0.71 0.61 - 1.24 mg/dL   Calcium 9.2 8.9 - 10.3 mg/dL   GFR, Estimated >60 >60 mL/min    Comment: (NOTE) Calculated using the CKD-EPI Creatinine Equation (2021)    Anion gap 8 5 - 15    Comment: Performed at [facility], [address] [place], [place] [address]    Blood Alcohol level:  Lab Results  Component Value Date   [facility] <10 11/21/2017   ETH <10 06/00/4599    Metabolic Disorder Labs: Lab Results  Component Value Date   HGBA1C 5.7 (H) 03/05/2021   MPG 116.89 03/05/2021   No results found for: PROLACTIN Lab Results  Component Value Date   CHOL 116 03/05/2021   TRIG 66 03/05/2021   HDL 49 03/05/2021   CHOLHDL 2.4 03/05/2021   VLDL 13 03/05/2021   LDLCALC 54 03/05/2021   LDLCALC 119 (H) 09/29/2016    Physical Findings: AIMS:  , ,  ,  ,    CIWA:    COWS:     Musculoskeletal: Strength & Muscle Tone: within normal limits Gait & Station: normal Patient leans: N/A  Psychiatric Specialty Exam:  Presentation  General Appearance: Appropriate for  Environment  Eye Contact:Fair  Speech:Pressured  Speech Volume:Normal  Handedness:Right   Mood and Affect  Mood:Euphoric  Affect:Congruent   Thought Process  Thought Processes:Disorganized  Descriptions of Associations:Tangential  Orientation:Full (Time, Place and Person)  Thought Content:Delusions; Tangential  History of Schizophrenia/Schizoaffective disorder:No  Duration of Psychotic Symptoms:N/A  Hallucinations:No data recorded Ideas of Reference:None  Suicidal Thoughts:No data recorded Homicidal Thoughts:No data recorded  Sensorium  Memory:Immediate Fair; Recent Fair; Remote [place]   Executive Functions  Concentration:Poor  Attention Span:Poor  [place]   Psychomotor Activity  Psychomotor Activity:No data recorded  Assets  Assets:Desire for Improvement; Financial Resources/Insurance; Housing; Intimacy; Resilience; Social Support; Leisure Time   Sleep  Sleep:No data recorded   Physical Exam: Physical Exam Vitals and nursing note reviewed.  Constitutional:      Appearance: Normal appearance.  HENT:     Head: Normocephalic and atraumatic.     Mouth/Throat:     Pharynx: Oropharynx is clear.  Eyes:     Pupils: Pupils are equal, round, and reactive to light.  Cardiovascular:     Rate and Rhythm: Normal rate and regular rhythm.  Pulmonary:  Effort: Pulmonary effort is normal.     Breath sounds: Normal breath sounds.  Abdominal:     General: Abdomen is flat.     Palpations: Abdomen is soft.  Musculoskeletal:        General: Normal range of motion.  Skin:    General: Skin is warm and dry.  Neurological:     General: No focal deficit present.     Mental Status: He is alert. Mental status is at baseline.  Psychiatric:        Attention and Perception: Attention normal.        Mood and Affect: Mood is elated.        Speech: Speech normal.         Behavior: Behavior normal. Behavior is cooperative.        Thought Content: Thought content normal.        Cognition and Memory: Cognition normal.        Judgment: Judgment is impulsive.   Review of Systems  Constitutional: Negative.   HENT: Negative.    Eyes: Negative.   Respiratory: Negative.    Cardiovascular: Negative.   Gastrointestinal: Negative.   Musculoskeletal: Negative.   Skin: Negative.   Neurological: Negative.   Psychiatric/Behavioral: Negative.    Blood pressure (!) 156/96, pulse 93, temperature (!) 97.4 F (36.3 C), temperature source Oral, resp. rate 18, height 6' (1.829 m), weight 97.5 kg, SpO2 98 %. Body mass index is 29.16 kg/m.   Treatment Plan Summary: Medication management and Plan increased dose of Depakote to 1000 mg at night.  No change to Seroquel.  Recheck sodium tomorrow.  Patient is wanting to be discharged tomorrow and he certainly seems to be doing much better without any acute dangerousness.  We will reevaluate how he is doing tomorrow before making a final decision  Alethia Berthold, MD 03/08/2021, 2:27 PM

## 2021-03-08 NOTE — Progress Notes (Signed)
Per patient's wife, patient had not been incontinent and that is a recent development. Patient has been  incontinent of urine since admission, so it is unclear when this began.

## 2021-03-08 NOTE — Progress Notes (Signed)
Patient is cooperative with assessment. He says he is feeling good and slept well. He is calmer and less grandiose compared to previous encounters with patient. He can be intrusive at times but is redirectable. Patient appetite is good. He is active on the unit and is interacting with staff and other patients in the dayroom. Patient is compliant with scheduled medications and was receptive to medication education. Support and encouragement provided. Patient remains safe on the unit at this time.

## 2021-03-08 NOTE — Progress Notes (Signed)
Patient yelled out loudly several times throughout the shift. Reports having night terrors. Was pleasant earlier in shift, can be intrusive at times. Wife in for visit first of shift. Encouragement and support provided. Safety checks maintained. Medications given as prescribed. Pt receptive and remains safe on unit with q 15 min checks.

## 2021-03-08 NOTE — Progress Notes (Signed)
Recreation Therapy Notes   Date: 03/08/2021  Time: 1:15 pm   Location: Courtyard /Craft room  Behavioral response: Appropriate  Intervention Topic: Relaxation    Discussion/Intervention:  Group content today was focused on relaxation. The group defined relaxation and identified healthy ways to relax. Individuals expressed how much time they spend relaxing. Patients expressed how much their life would be if they did not make time for themselves to relax. The group stated ways they could improve their relaxation techniques in the future.  Individuals participated in the intervention "Time to Relax" where they had a chance to experience different relaxation techniques.  Clinical Observations/Feedback: Patient came to group and identified sitting out in the sun, watching the news and listening to music as things that relax him. He stated that currently what is stopping him from relaxing is his concern about the elections and inflation. Individual was social with staff while participating in the intervention.  Costella Schwarz LRT/CTRS         Jordyan Hardiman 03/08/2021 2:41 PM

## 2021-03-08 NOTE — Progress Notes (Signed)
Patient is alert, oriented to person, place and time. Compliant and cooperative. Denies SI, HI, and AVH. Denies pain. Compliant with medication. Q15 minute safety check in place. Patient remain safe on the unit at this time.

## 2021-03-09 MED ORDER — DIVALPROEX SODIUM 500 MG PO DR TAB: 1000.0000 mg | DELAYED_RELEASE_TABLET | Freq: Every day | ORAL | 0 refills | Status: AC

## 2021-03-09 MED ORDER — QUETIAPINE FUMARATE 300 MG PO TABS: 300.0000 mg | ORAL_TABLET | Freq: Every day | ORAL | 0 refills | Status: DC

## 2021-03-09 MED ORDER — LATANOPROST 0.005 % OP SOLN: 1.0000 [drp] | Freq: Two times a day (BID) | OPHTHALMIC | 0 refills | Status: AC

## 2021-03-09 MED ORDER — ASPIRIN EC 81 MG PO TBEC: 81.0000 mg | DELAYED_RELEASE_TABLET | Freq: Every day | ORAL | 0 refills | Status: DC

## 2021-03-09 MED ORDER — FISH OIL 1000 MG PO CAPS: 1000.0000 mg | ORAL_CAPSULE | Freq: Two times a day (BID) | ORAL | 0 refills | Status: DC

## 2021-03-09 MED ORDER — METOPROLOL SUCCINATE ER 25 MG PO TB24: 25.0000 mg | ORAL_TABLET | Freq: Every day | ORAL | 0 refills | Status: DC

## 2021-03-09 MED ORDER — BISACODYL 5 MG PO TBEC: 5.0000 mg | DELAYED_RELEASE_TABLET | Freq: Every day | ORAL | 0 refills | Status: AC | PRN

## 2021-03-09 MED ORDER — TIMOLOL MALEATE 0.5 % OP SOLN: 1.0000 [drp] | Freq: Two times a day (BID) | OPHTHALMIC | 12 refills | Status: AC

## 2021-03-09 MED ORDER — DORZOLAMIDE HCL 2 % OP SOLN: 1.0000 [drp] | Freq: Two times a day (BID) | OPHTHALMIC | 0 refills | Status: AC

## 2021-03-09 MED ORDER — AMLODIPINE BESYLATE 10 MG PO TABS: 10.0000 mg | ORAL_TABLET | Freq: Every day | ORAL | 0 refills | Status: DC

## 2021-03-09 MED ORDER — PANTOPRAZOLE SODIUM 40 MG PO TBEC: 40.0000 mg | DELAYED_RELEASE_TABLET | Freq: Two times a day (BID) | ORAL | 0 refills | Status: AC

## 2021-03-09 MED ORDER — ATORVASTATIN CALCIUM 40 MG PO TABS: 40.0000 mg | ORAL_TABLET | Freq: Every day | ORAL | 0 refills | Status: AC

## 2021-03-09 MED ORDER — GABAPENTIN 300 MG PO CAPS: 300.0000 mg | ORAL_CAPSULE | Freq: Two times a day (BID) | ORAL | 0 refills | Status: AC

## 2021-03-09 MED ORDER — CARVEDILOL 25 MG PO TABS: 25.0000 mg | ORAL_TABLET | Freq: Two times a day (BID) | ORAL | 0 refills | Status: DC

## 2021-03-09 NOTE — Progress Notes (Signed)
  [facility] Adult Case Management Discharge Plan :  Will you be returning to the same living situation after discharge:  Yes,  pt will be returning to his assisted living facility At discharge, do you have transportation home?: Yes,  CSW will assist pt in arranging transporation Do you have the ability to pay for your medications: Yes,  pt has Medicare Part A & B  Release of information consent forms completed and in the chart;  Patient's signature needed at discharge.  Patient to Follow up at:  Follow-up Information     [facility] Follow up.   Specialty: [place] Why: Please follow up with aftercare staff at facility to make an appointment. Thanks! Contact information: [address] [place] [phone]                Next level of care provider has access to [place] and Suicide Prevention discussed: Yes,  completed with pt's partner     Has patient been referred to the Quitline?: N/A patient is not a smoker  Patient has been referred for addiction treatment: N/A  Matthias Bogus A [place], [place] 03/09/2021, 2:19 PM

## 2021-03-09 NOTE — Discharge Summary (Signed)
Physician Discharge Summary Note  Patient:  Douglas Perkins. is an 75 y.o., male MRN:  [id number] DOB:  Aug 17, 1945 Patient phone:  [phone] (home)  Patient address:   [address] [place] [address],  Total Time spent with patient: 45 minutes  Date of Admission:  03/03/2021 Date of Discharge: 03/09/2021  Reason for Admission: Admitted to the hospital for stabilization of bipolar mania  Principal Problem: Bipolar affective disorder, current episode manic [facility]) Discharge Diagnoses: Principal Problem:   Bipolar affective disorder, current episode manic ([place]) Active Problems:   Benign essential hypertension   Hyperlipidemia, mixed   Hyponatremia syndrome   Prediabetes   Past Psychiatric History: Long history of chronic bipolar disorder complicated by hyponatremia  Past Medical History:  Past Medical History:  Diagnosis Date   Bipolar 1 disorder ([place])    Cancer ([place])    prostate   Glaucoma    Hypertension    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[place])    Mural thrombus of cardiac apex    Presence of permanent cardiac pacemaker 2017   SA node dysfunction   Sleep apnea    does not use CPAP   TIA (transient ischemic attack)     Past Surgical History:  Procedure Laterality Date   CHOLECYSTECTOMY     COLONOSCOPY WITH PROPOFOL N/A 01/31/2017   Procedure: COLONOSCOPY WITH PROPOFOL;  Surgeon: Manya Silvas, MD;  Location: [facility] ENDOSCOPY;  Service: Endoscopy;  Laterality: N/A;   DRUG INDUCED ENDOSCOPY N/A 05/12/2020   Procedure: DRUG INDUCED ENDOSCOPY;  Surgeon: Melida Quitter, MD;  Location: [place];  Service: ENT;  Laterality: N/A;   IMPLANTATION OF HYPOGLOSSAL NERVE STIMULATOR N/A 07/21/2020   Procedure: IMPLANTATION OF HYPOGLOSSAL NERVE STIMULATOR;  Surgeon: Melida Quitter, MD;  Location: [place];  Service: ENT;  Laterality: N/A;   LEG SURGERY Left    distal   Pace maker placement     PROSTATE SURGERY     PROSTATECTOMY     SKIN CANCER EXCISION   2006,2007   Merkle Cell Carcinoma   Family History:  Family History  Problem Relation Age of Onset   Stroke Father    CAD Paternal Grandmother    CAD Paternal Grandfather    Family Psychiatric  History: See previous Social History:  Social History   Substance and Sexual Activity  Alcohol Use No   Alcohol/week: 0.0 standard drinks     Social History   Substance and Sexual Activity  Drug Use No    Social History   Socioeconomic History   Marital status: Married    Spouse name: Not on file   Number of children: Not on file   Years of education: Not on file   Highest education level: Not on file  Occupational History   Occupation: disabled  Tobacco Use   Smoking status: Never   Smokeless tobacco: Never  Substance and Sexual Activity   Alcohol use: No    Alcohol/week: 0.0 standard drinks   Drug use: No   Sexual activity: Not on file  Other Topics Concern   Not on file  Social History Narrative   Not on file   Social Determinants of Health   Financial Resource Strain: Not on file  Food Insecurity: Not on file  Transportation Needs: Not on file  Physical Activity: Not on file  Stress: Not on file  Social Connections: Not on file    Hospital Course: Patient admitted to psychiatric unit.  Continued on Seroquel and gabapentin as  previously.  Spoke with the patient and his wife about the risks of hyponatremia that could be associated with restarting Depakote.  Patient and wife both familiar with the risk but wished to proceed with restarting gabapentin.  Medicine has been restarted.  At 750 mg the blood level was only 26 but that was only after a few days.  I am increasing his dose now to 2000 mg at night.  Patient's symptoms have improved greatly.  He is no longer hyperverbal and not agitated not grandiose and not hostile.  Able to articulate a positive plan for the future and has good insight.  No sign of dangerousness.  Sodiums have been checked every day and have  remained essentially normal.  Patient educated about all this and the importance of following up with his provider.  Physical Findings: AIMS:  , ,  ,  ,    CIWA:    COWS:     Musculoskeletal: Strength & Muscle Tone: within normal limits Gait & Station: unsteady Patient leans: none   Psychiatric Specialty Exam:  Presentation  General Appearance: Appropriate for Environment  Eye Contact:Fair  Speech:Pressured  Speech Volume:Normal  Handedness:Right   Mood and Affect  Mood:Euphoric  Affect:Congruent   Thought Process  Thought Processes:Disorganized  Descriptions of Associations:Tangential  Orientation:Full (Time, Place and Person)  Thought Content:Delusions; Tangential  History of Schizophrenia/Schizoaffective disorder:No  Duration of Psychotic Symptoms:N/A  Hallucinations:No data recorded Ideas of Reference:None  Suicidal Thoughts:No data recorded Homicidal Thoughts:No data recorded  Sensorium  Memory:Immediate Fair; Recent Fair; Remote [place]   Executive Functions  Concentration:Poor  Attention Span:Poor  Savage   Psychomotor Activity  Psychomotor Activity:No data recorded  Assets  Assets:Desire for Improvement; Financial Resources/Insurance; Housing; Intimacy; Resilience; Social Support; Leisure Time   Sleep  Sleep:No data recorded   Physical Exam: Physical Exam Vitals and nursing note reviewed.  Constitutional:      Appearance: Normal appearance.  HENT:     Head: Normocephalic and atraumatic.     Mouth/Throat:     Pharynx: Oropharynx is clear.  Eyes:     Pupils: Pupils are equal, round, and reactive to light.  Cardiovascular:     Rate and Rhythm: Normal rate and regular rhythm.  Pulmonary:     Effort: Pulmonary effort is normal.     Breath sounds: Normal breath sounds.  Abdominal:     General: Abdomen is flat.     Palpations: Abdomen is  soft.  Musculoskeletal:        General: Normal range of motion.  Skin:    General: Skin is warm and dry.  Neurological:     General: No focal deficit present.     Mental Status: He is alert. Mental status is at baseline.  Psychiatric:        Mood and Affect: Mood normal.        Thought Content: Thought content normal.   Review of Systems  Constitutional: Negative.   HENT: Negative.    Eyes: Negative.   Respiratory: Negative.    Cardiovascular: Negative.   Gastrointestinal: Negative.   Musculoskeletal: Negative.   Skin: Negative.   Neurological: Negative.   Psychiatric/Behavioral: Negative.    Blood pressure 135/78, pulse 81, temperature (!) 97.3 F (36.3 C), temperature source Oral, resp. rate 20, height 6' (1.829 m), weight 97.5 kg, SpO2 97 %. Body mass index is 29.16 kg/m.   Social History   Tobacco Use  Smoking Status Never  Smokeless Tobacco Never   Tobacco Cessation:  N/A, patient does not currently use tobacco products   Blood Alcohol level:  Lab Results  Component Value Date   ETH <10 11/21/2017   ETH <10 66/44/0347    Metabolic Disorder Labs:  Lab Results  Component Value Date   HGBA1C 5.7 (H) 03/05/2021   MPG 116.89 03/05/2021   No results found for: PROLACTIN Lab Results  Component Value Date   CHOL 116 03/05/2021   TRIG 66 03/05/2021   HDL 49 03/05/2021   CHOLHDL 2.4 03/05/2021   VLDL 13 03/05/2021   LDLCALC 54 03/05/2021   LDLCALC 119 (H) 09/29/2016    See Psychiatric Specialty Exam and Suicide Risk Assessment completed by Attending Physician prior to discharge.  Discharge destination:  ALF  Is patient on multiple antipsychotic therapies at discharge:  No   Has Patient had three or more failed trials of antipsychotic monotherapy by history:  No  Recommended Plan for Multiple Antipsychotic Therapies: NA  Discharge Instructions     Diet - low sodium heart healthy   Complete by: As directed    Increase activity slowly   Complete by:  As directed       Allergies as of 03/09/2021       Reactions   Other Hives, Shortness Of Breath, Other (See Comments)   Pt states that he is allergic to unwashed blood products.   Other reaction(s): Other (See Comments) UNWASHED BLOOD PRODUCTS  Pt states that he is allergic to unwashed blood products.     Feldene [piroxicam] Hives   Sulfa Antibiotics Hives        Medication List     STOP taking these medications    hydrOXYzine 50 MG tablet Commonly known as: ATARAX/VISTARIL   multivitamin with minerals Tabs tablet       TAKE these medications      Indication  amLODipine 10 MG tablet Commonly known as: NORVASC Take 1 tablet (10 mg total) by mouth daily.  Indication: High Blood Pressure Disorder   aspirin EC 81 MG tablet Take 1 tablet (81 mg total) by mouth daily. Reported on 08/18/2015  Indication: Arthritis   atorvastatin 40 MG tablet Commonly known as: LIPITOR Take 1 tablet (40 mg total) by mouth daily. Start taking on: March 10, 2021 What changed: when to take this  Indication: High Amount of Fats in the Blood   bisacodyl 5 MG EC tablet Commonly known as: DULCOLAX Take 1 tablet (5 mg total) by mouth daily as needed for moderate constipation.  Indication: Constipation   carvedilol 25 MG tablet Commonly known as: COREG Take 1 tablet (25 mg total) by mouth in the morning and at bedtime.  Indication: High Blood Pressure Disorder   divalproex 500 MG DR tablet Commonly known as: DEPAKOTE Take 2 tablets (1,000 mg total) by mouth at bedtime.  Indication: Manic Phase of Manic-Depression   dorzolamide 2 % ophthalmic solution Commonly known as: TRUSOPT Place 1 drop into the right eye 2 (two) times daily.  Indication: Wide-Angle Glaucoma   Fish Oil 1000 MG Caps Take 1 capsule (1,000 mg total) by mouth 2 (two) times daily.  Indication: Diabetes   gabapentin 300 MG capsule Commonly known as: NEURONTIN Take 1 capsule (300 mg total) by mouth 2 (two)  times daily.  Indication: Disease of the Peripheral Nerves   latanoprost 0.005 % ophthalmic solution Commonly known as: XALATAN Place 1 drop into the right eye 2 (two) times daily.  Indication: Wide-Angle Glaucoma  metoprolol succinate 25 MG 24 hr tablet Commonly known as: TOPROL-XL Take 1 tablet (25 mg total) by mouth daily. Start taking on: March 10, 2021  Indication: High Blood Pressure Disorder   pantoprazole 40 MG tablet Commonly known as: PROTONIX Take 1 tablet (40 mg total) by mouth 2 (two) times daily.  Indication: Gastroesophageal Reflux Disease   QUEtiapine 300 MG tablet Commonly known as: SEROQUEL Take 1 tablet (300 mg total) by mouth at bedtime.  Indication: Manic Phase of Manic-Depression   timolol 0.5 % ophthalmic solution Commonly known as: TIMOPTIC Place 1 drop into the right eye 2 (two) times daily.          Follow-up recommendations: Patient is returning to his assisted living facility.  They came over today and spoke with him and have approved the plan for discharge feeling that his symptoms are greatly improved.  Prescriptions are being provided at discharge.  Comments: Patient is aware of the current plan and medicines were reviewed.  Signed: Alethia Berthold, MD 03/09/2021, 11:18 AM

## 2021-03-09 NOTE — Plan of Care (Signed)
Patient alert and oriented x 4.  Affect is pleasant and calm. Denies anxiety, SI/HI or AVH.  Patient states they will try to keep themselves safe when they return home.  Reviewed discharge instructions with patient including follow up appointment with provider, medication and prescriptions.  Questions answered and understanding verbalized.  Discharge packet given.  All belongings returned to patient after verification completed by staff.    Patient escorted by staff off unit at this time stable without complaint.  Problem: Education: Goal: Emotional status will improve Outcome: Adequate for Discharge Goal: Mental status will improve Outcome: Adequate for Discharge Goal: Verbalization of understanding the information provided will improve Outcome: Adequate for Discharge   Problem: Activity: Goal: Sleeping patterns will improve Outcome: Adequate for Discharge   Problem: Coping: Goal: Ability to verbalize frustrations and anger appropriately will improve Outcome: Adequate for Discharge Goal: Ability to demonstrate self-control will improve Outcome: Adequate for Discharge

## 2021-03-09 NOTE — Progress Notes (Signed)
Patient seen in dayroom.  Appearance is neat and well groomed.  Patient is A&Ox4.  States, "I'm doing great, better than ever."  VSS.  Denies AVH, SI or HI.  Rates depression 0/10 and anxiety 0/10.  Denies pain.  Reports sleeping well and appetite good.  Patient compliant with all scheduled meds.  Reviewed POC with patient. Questions answered and understanding verbalized. Ongoing Q15 minute safety check rounds per unit protocol.

## 2021-03-09 NOTE — BH IP Treatment Plan (Signed)
Interdisciplinary Treatment and Diagnostic Plan Update  03/09/2021 Time of Session: 8:30AM Douglas Perkins. MRN: [id number]  Principal Diagnosis: Bipolar affective disorder, current episode manic ([place])  Secondary Diagnoses: Principal Problem:   Bipolar affective disorder, current episode manic ([place]) Active Problems:   Benign essential hypertension   Hyperlipidemia, mixed   Hyponatremia syndrome   Prediabetes   Current Medications:  Current Facility-Administered Medications  Medication Dose Route Frequency Provider Last Rate Last Admin   acetaminophen (TYLENOL) tablet 650 mg  650 mg Oral Q6H PRN Clapacs, John T, MD       alum & mag hydroxide-simeth (MAALOX/MYLANTA) 200-200-20 MG/5ML suspension 30 mL  30 mL Oral Q4H PRN Clapacs, John T, MD       amLODipine (NORVASC) tablet 10 mg  10 mg Oral Daily Clapacs, John T, MD   10 mg at 03/09/21 0904   atorvastatin (LIPITOR) tablet 40 mg  40 mg Oral Daily Dorothe Pea, RPH   40 mg at 03/09/21 3382   bisacodyl (DULCOLAX) EC tablet 5 mg  5 mg Oral Daily PRN Clapacs, John T, MD       divalproex (DEPAKOTE) DR tablet 1,000 mg  1,000 mg Oral QHS Clapacs, John T, MD   1,000 mg at 03/08/21 2125   dorzolamide (TRUSOPT) 2 % ophthalmic solution 1 drop  1 drop Right Eye BID Clapacs, Madie Reno, MD   1 drop at 03/09/21 5053   And   timolol (TIMOPTIC) 0.5 % ophthalmic solution 1 drop  1 drop Right Eye BID Clapacs, Madie Reno, MD   1 drop at 03/09/21 0904   gabapentin (NEURONTIN) capsule 300 mg  300 mg Oral BID Clapacs, John T, MD   300 mg at 03/09/21 0904   latanoprost (XALATAN) 0.005 % ophthalmic solution 1 drop  1 drop Right Eye BID Clapacs, Madie Reno, MD   1 drop at 03/09/21 0904   LORazepam (ATIVAN) tablet 1 mg  1 mg Oral Q6H PRN Clapacs, John T, MD   1 mg at 03/08/21 1100   magnesium hydroxide (MILK OF MAGNESIA) suspension 30 mL  30 mL Oral Daily PRN Clapacs, John T, MD       metoprolol succinate (TOPROL-XL) 24 hr tablet 25 mg  25 mg Oral Daily Clapacs, John  T, MD   25 mg at 03/09/21 0904   pantoprazole (PROTONIX) EC tablet 40 mg  40 mg Oral BID Clapacs, John T, MD   40 mg at 03/09/21 0904   QUEtiapine (SEROQUEL) tablet 300 mg  300 mg Oral QHS Clapacs, John T, MD   300 mg at 03/08/21 2125   traZODone (DESYREL) tablet 50 mg  50 mg Oral QHS PRN Salley Scarlet, MD   50 mg at 03/07/21 2047   PTA Medications: Medications Prior to Admission  Medication Sig Dispense Refill Last Dose   atorvastatin (LIPITOR) 40 MG tablet Take 1 tablet by mouth 1 day or 1 dose.      bisacodyl (DULCOLAX) 5 MG EC tablet Take 1 tablet (5 mg total) by mouth daily as needed for moderate constipation. 15 tablet 0    hydrOXYzine (ATARAX/VISTARIL) 50 MG tablet Take 100 mg by mouth at bedtime.      latanoprost (XALATAN) 0.005 % ophthalmic solution Place 1 drop into the right eye 2 (two) times daily.      Multiple Vitamin (MULTIVITAMIN WITH MINERALS) TABS tablet Take 1 tablet by mouth daily.      [DISCONTINUED] amLODipine (NORVASC) 10 MG tablet Take 10 mg by  mouth daily.  0    [DISCONTINUED] aspirin EC 81 MG tablet Take 81 mg by mouth daily. Reported on 08/18/2015      [DISCONTINUED] carvedilol (COREG) 25 MG tablet Take 25 mg by mouth in the morning and at bedtime.      [DISCONTINUED] gabapentin (NEURONTIN) 300 MG capsule Take 300 mg by mouth 2 (two) times daily.      [DISCONTINUED] Omega-3 Fatty Acids (FISH OIL) 1000 MG CAPS Take 1,000 mg by mouth 2 (two) times daily.       [DISCONTINUED] pantoprazole (PROTONIX) 40 MG tablet Take 40 mg by mouth 2 (two) times daily.      [DISCONTINUED] QUEtiapine (SEROQUEL) 300 MG tablet Take 300 mg by mouth at bedtime.       Patient Stressors:    Patient Strengths:    Treatment Modalities: Medication Management, Group therapy, Case management,  1 to 1 session with clinician, Psychoeducation, Recreational therapy.   Physician Treatment Plan for Primary Diagnosis: Bipolar affective disorder, current episode manic ([place]) Long Term Goal(s):  Improvement in symptoms so as ready for discharge   Short Term Goals: Ability to maintain clinical measurements within normal limits will improve Compliance with prescribed medications will improve Ability to demonstrate self-control will improve Ability to identify and develop effective coping behaviors will improve  Medication Management: Evaluate patient's response, side effects, and tolerance of medication regimen.  Therapeutic Interventions: 1 to 1 sessions, Unit Group sessions and Medication administration.  Evaluation of Outcomes: Adequate for Discharge  Physician Treatment Plan for Secondary Diagnosis: Principal Problem:   Bipolar affective disorder, current episode manic ([place]) Active Problems:   Benign essential hypertension   Hyperlipidemia, mixed   Hyponatremia syndrome   Prediabetes  Long Term Goal(s): Improvement in symptoms so as ready for discharge   Short Term Goals: Ability to maintain clinical measurements within normal limits will improve Compliance with prescribed medications will improve Ability to demonstrate self-control will improve Ability to identify and develop effective coping behaviors will improve     Medication Management: Evaluate patient's response, side effects, and tolerance of medication regimen.  Therapeutic Interventions: 1 to 1 sessions, Unit Group sessions and Medication administration.  Evaluation of Outcomes: Adequate for Discharge   RN Treatment Plan for Primary Diagnosis: Bipolar affective disorder, current episode manic ([place]) Long Term Goal(s): Knowledge of disease and therapeutic regimen to maintain health will improve  Short Term Goals: Ability to remain free from injury will improve, Ability to verbalize frustration and anger appropriately will improve, Ability to demonstrate self-control, Ability to participate in decision making will improve, Ability to verbalize feelings will improve, Ability to identify and develop effective  coping behaviors will improve, and Compliance with prescribed medications will improve  Medication Management: RN will administer medications as ordered by provider, will assess and evaluate patient's response and provide education to patient for prescribed medication. RN will report any adverse and/or side effects to prescribing provider.  Therapeutic Interventions: 1 on 1 counseling sessions, Psychoeducation, Medication administration, Evaluate responses to treatment, Monitor vital signs and CBGs as ordered, Perform/monitor CIWA, COWS, AIMS and Fall Risk screenings as ordered, Perform wound care treatments as ordered.  Evaluation of Outcomes: Adequate for Discharge   LCSW Treatment Plan for Primary Diagnosis: Bipolar affective disorder, current episode manic ([place]) Long Term Goal(s): Safe transition to appropriate next level of care at discharge, Engage patient in therapeutic group addressing interpersonal concerns.  Short Term Goals: Engage patient in aftercare planning with referrals and resources, Increase social support, Increase ability to  appropriately verbalize feelings, Increase emotional regulation, Facilitate acceptance of mental health diagnosis and concerns, Identify triggers associated with mental health/substance abuse issues, and Increase skills for wellness and recovery  Therapeutic Interventions: Assess for all discharge needs, 1 to 1 time with Social worker, Explore available resources and support systems, Assess for adequacy in community support network, Educate family and significant other(s) on suicide prevention, Complete Psychosocial Assessment, Interpersonal group therapy.  Evaluation of Outcomes: Adequate for Discharge   Progress in Treatment: Attending groups: Yes. Participating in groups: Yes. Taking medication as prescribed: Yes. Toleration medication: Yes. Family/Significant other contact made: Yes, individual(s) contacted:  pt's wife Patient understands  diagnosis: Yes. Discussing patient identified problems/goals with staff: Yes. Medical problems stabilized or resolved: Yes. Denies suicidal/homicidal ideation: Yes. Issues/concerns per patient self-inventory: No. Other: None  New problem(s) identified: No, Describe:  None  New Short Term/Long Term Goal(s):  elimination of symptoms of psychosis, medication management for mood stabilization; elimination of SI thoughts; development of comprehensive mental wellness plan. Update 03/09/21: No changes at this time   Patient Goals: "Get back on my Depakote"  Update 03/09/21: No changes at this time   Discharge Plan or Barriers: CSW will assist pt in development of appropriate discharge/aftercare plan. Update 03/09/21: Patient will be returning back to his assisted living facility and CSW will assist with arranging transportation.   Reason for Continuation of Hospitalization: Aggression Medication stabilization   Estimated Length of Stay: TBD     Scribe for Treatment Team: Andreia Gandolfi A [place], Latanya Presser 03/09/2021 1:53 PM

## 2021-03-09 NOTE — Care Management Important Message (Signed)
Important Message  Patient Details  Name: Douglas Perkins. MRN: [id number] Date of Birth: [birth year]   Medicare Important Message Given:  Yes     Princella Jaskiewicz A [place], [place] 03/09/2021, 1:31 PM

## 2021-03-09 NOTE — [facility] Suicide Risk Assessment (Signed)
[facility] Discharge Suicide Risk Assessment   Principal Problem: Bipolar affective disorder, current episode manic ([place]) Discharge Diagnoses: Principal Problem:   Bipolar affective disorder, current episode manic ([place]) Active Problems:   Benign essential hypertension   Hyperlipidemia, mixed   Hyponatremia syndrome   Prediabetes   Total Time spent with patient: 45 minutes  Musculoskeletal: Strength & Muscle Tone: within normal limits Gait & Station: unsteady Patient leans: N/A  Psychiatric Specialty Exam  Presentation  General Appearance: Appropriate for Environment  Eye Contact:Fair  Speech:Pressured  Speech Volume:Normal  Handedness:Right   Mood and Affect  Mood:Euphoric  Duration of Depression Symptoms: No data recorded Affect:Congruent   Thought Process  Thought Processes:Disorganized  Descriptions of Associations:Tangential  Orientation:Full (Time, Place and Person)  Thought Content:Delusions; Tangential  History of Schizophrenia/Schizoaffective disorder:No  Duration of Psychotic Symptoms:N/A  Hallucinations:No data recorded Ideas of Reference:None  Suicidal Thoughts:No data recorded Homicidal Thoughts:No data recorded  Sensorium  Memory:Immediate Fair; Recent Fair; Remote Goodnight   Executive Functions  Concentration:Poor  Attention Span:Poor  [place]   Psychomotor Activity  Psychomotor Activity:No data recorded  Assets  Assets:Desire for Improvement; Financial Resources/Insurance; Housing; Intimacy; Resilience; Social Support; Leisure Time   Sleep  Sleep:No data recorded  Physical Exam: Physical Exam Vitals and nursing note reviewed.  Constitutional:      Appearance: Normal appearance.  HENT:     Head: Normocephalic and atraumatic.     Mouth/Throat:     Pharynx: Oropharynx is clear.  Eyes:     Pupils: Pupils are equal, round, and reactive to  light.  Cardiovascular:     Rate and Rhythm: Normal rate and regular rhythm.  Pulmonary:     Effort: Pulmonary effort is normal.     Breath sounds: Normal breath sounds.  Abdominal:     General: Abdomen is flat.     Palpations: Abdomen is soft.  Musculoskeletal:        General: Normal range of motion.  Skin:    General: Skin is warm and dry.  Neurological:     General: No focal deficit present.     Mental Status: He is alert. Mental status is at baseline.  Psychiatric:        Attention and Perception: Attention normal.        Mood and Affect: Mood normal.        Speech: Speech normal.        Behavior: Behavior is cooperative.        Thought Content: Thought content normal.        Cognition and Memory: Cognition normal.        Judgment: Judgment normal.   Review of Systems  Constitutional: Negative.   HENT: Negative.    Eyes: Negative.   Respiratory: Negative.    Cardiovascular: Negative.   Gastrointestinal: Negative.   Musculoskeletal: Negative.   Skin: Negative.   Neurological: Negative.   Psychiatric/Behavioral: Negative.    Blood pressure 135/78, pulse 81, temperature (!) 97.3 F (36.3 C), temperature source Oral, resp. rate 20, height 6' (1.829 m), weight 97.5 kg, SpO2 97 %. Body mass index is 29.16 kg/m.  Mental Status Per Nursing Assessment::   On Admission:  NA  Demographic Factors:  Age 56 or older  Loss Factors: Decline in physical health  Historical Factors: Impulsivity  Risk Reduction Factors:   Sense of responsibility to family, Religious beliefs about death, Living with another person, especially a relative, Positive social support, Positive therapeutic  relationship, and Positive coping skills or problem solving skills  Continued Clinical Symptoms:  Bipolar Disorder:   Mixed State  Cognitive Features That Contribute To Risk:  None    Suicide Risk:  Minimal: No identifiable suicidal ideation.  Patients presenting with no risk factors but with  morbid ruminations; may be classified as minimal risk based on the severity of the depressive symptoms    Plan Of Care/Follow-up recommendations:  Patient is denying any suicidal ideation and has not shown any evidence of dangerousness.  He has appropriate outpatient treatment and safe living environment and is fully in agreement to outpatient treatment plan.  No evidence of current dangerousness.  Patient educated about the importance of following up with outpatient mental health treatment  Alethia Berthold, MD 03/09/2021, 11:12 AM

## 2021-03-09 NOTE — Progress Notes (Addendum)
Recreation Therapy Notes    Date: 03/09/2021  Time: 1:30pm   Location: Craft room  Behavioral response: Appropriate  Intervention Topic: Wellness  Discussion/Intervention:  Group content today was focused on Wellness. The group defined wellness and some positive ways they make decisions for themselves. Individuals expressed reasons why they neglected any wellness in the past. Patients described ways to improve wellness skills in the future. The group explained what could happen if they did not do any wellness at all. Participants express how bad choices has affected them and others around them. Individual explained the importance of wellness. The group participated in the intervention "Testing my Wellness" where they had a chance to identify some of their weaknesses and strengths in wellness.  Clinical Observations/Feedback: Patient came to group and was focused on what peers and staff had to say about wellness. Individual was social with staff while participating in the intervention.  Deva Ron LRT/CTRS         Kennah Hehr 03/09/2021 2:44 PM

## 2021-03-09 NOTE — NC FL2 (Signed)
[place] LEVEL OF CARE SCREENING TOOL     IDENTIFICATION  Patient Name: Douglas Perkins. Birthdate: 11/09/1945 Sex: male Admission Date (Current Location): 03/03/2021  [facility] and [place] Number:  Engineering geologist and Address:  [facility], [address], [place], [place] [address]      Provider Number: [id number]  Attending Physician Name and Address:  Gonzella Lex, MD  Relative Name and Phone Number:  Jade, Burright (Spouse)   [phone]    Current Level of Care: Hospital Recommended Level of Care: [place] Prior Approval Number:    Date Approved/Denied:   PASRR Number:    Discharge Plan:  (ALF- assisted living facility)    Current Diagnoses: Patient Active Problem List   Diagnosis Date Noted   Prediabetes 03/06/2021   Bipolar affective disorder, current episode manic ([place]) 03/03/2021   Pressure injury of skin 73/71/0626   Acute metabolic encephalopathy 94/85/4627   Hyponatremia syndrome 02/18/2021   OSA (obstructive sleep apnea) 11/26/2019   Cardiac pacemaker 04/30/2019   History of sinoatrial node dysfunction 04/30/2019   Other male erectile dysfunction 11/12/2018   Near syncope 05/22/2017   Coronary artery disease involving native coronary artery of native heart 01/10/2017   Low serum testosterone level 06/16/2016   Obesity (BMI 30-39.9) 03/09/2016   Syncope 07/19/2015   Sepsis ([place]) 07/07/2015   Bipolar I disorder ([place]) 06/24/2015   Bilateral pneumonia 06/23/2015   Bipolar affective disorder, currently manic, mild ([place]) 06/18/2015   History of duodenal ulcer 06/18/2015   Vaccine counseling 06/18/2015   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   Bradycardia 02/08/2015   Hyperlipidemia, mixed 10/14/2014   AF (amaurosis fugax) 09/16/2014   Benign essential hypertension 09/16/2014   Chest pain 09/16/2014   Skin cyst 08/06/2012   Personal  history of lymphoma 08/06/2012   Cancer ([place])     Orientation RESPIRATION BLADDER Height & Weight     Self, Time, Situation, Place  Normal Incontinent (Incontient at night) Weight: 215 lb (97.5 kg) Height:  6' (182.9 cm)  BEHAVIORAL SYMPTOMS/MOOD NEUROLOGICAL BOWEL NUTRITION STATUS     (N/A) Continent    AMBULATORY STATUS COMMUNICATION OF NEEDS Skin   Independent Verbally Normal                       Personal Care Assistance Level of Assistance   (N/A)   Feeding assistance:  (N/A) Dressing Assistance:  (N/A)     Functional Limitations Info  Hearing   Hearing Info: Impaired (Pt wears 2 hearing aids)      SPECIAL CARE FACTORS FREQUENCY   (chronic sodium insufficiency)                    Contractures Contractures Info: Not present    Additional Factors Info  Code Status Code Status Info: FULL             Current Medications (03/09/2021):  This is the current hospital active medication list Current Facility-Administered Medications  Medication Dose Route Frequency Provider Last Rate Last Admin   acetaminophen (TYLENOL) tablet 650 mg  650 mg Oral Q6H PRN Clapacs, John T, MD       alum & mag hydroxide-simeth (MAALOX/MYLANTA) 200-200-20 MG/5ML suspension 30 mL  30 mL Oral Q4H PRN Clapacs, John T, MD       amLODipine (NORVASC) tablet 10 mg  10 mg Oral Daily Clapacs, Madie Reno, MD  10 mg at 03/09/21 0904   atorvastatin (LIPITOR) tablet 40 mg  40 mg Oral Daily Dorothe Pea, RPH   40 mg at 03/09/21 2585   bisacodyl (DULCOLAX) EC tablet 5 mg  5 mg Oral Daily PRN Clapacs, John T, MD       divalproex (DEPAKOTE) DR tablet 1,000 mg  1,000 mg Oral QHS Clapacs, John T, MD   1,000 mg at 03/08/21 2125   dorzolamide (TRUSOPT) 2 % ophthalmic solution 1 drop  1 drop Right Eye BID Clapacs, Madie Reno, MD   1 drop at 03/09/21 2778   And   timolol (TIMOPTIC) 0.5 % ophthalmic solution 1 drop  1 drop Right Eye BID Clapacs, Madie Reno, MD   1 drop at 03/09/21 0904   gabapentin  (NEURONTIN) capsule 300 mg  300 mg Oral BID Clapacs, John T, MD   300 mg at 03/09/21 0904   latanoprost (XALATAN) 0.005 % ophthalmic solution 1 drop  1 drop Right Eye BID Clapacs, Madie Reno, MD   1 drop at 03/09/21 0904   LORazepam (ATIVAN) tablet 1 mg  1 mg Oral Q6H PRN Clapacs, John T, MD   1 mg at 03/08/21 1100   magnesium hydroxide (MILK OF MAGNESIA) suspension 30 mL  30 mL Oral Daily PRN Clapacs, John T, MD       metoprolol succinate (TOPROL-XL) 24 hr tablet 25 mg  25 mg Oral Daily Clapacs, John T, MD   25 mg at 03/09/21 0904   pantoprazole (PROTONIX) EC tablet 40 mg  40 mg Oral BID Clapacs, John T, MD   40 mg at 03/09/21 0904   QUEtiapine (SEROQUEL) tablet 300 mg  300 mg Oral QHS Clapacs, John T, MD   300 mg at 03/08/21 2125   traZODone (DESYREL) tablet 50 mg  50 mg Oral QHS PRN Salley Scarlet, MD   50 mg at 03/07/21 2047     Discharge Medications: Please see discharge summary for a list of discharge medications.  Relevant Imaging Results:  Relevant Lab Results:   Additional Information    Domingo Fuson A [place], LCSWA

## 2022-02-27 ENCOUNTER — Encounter (INDEPENDENT_AMBULATORY_CARE_PROVIDER_SITE_OTHER): Payer: Self-pay

## 2022-04-09 ENCOUNTER — Emergency Department (HOSPITAL_COMMUNITY): Payer: Medicare Other

## 2022-04-09 ENCOUNTER — Other Ambulatory Visit: Payer: Self-pay

## 2022-04-09 ENCOUNTER — Inpatient Hospital Stay (HOSPITAL_COMMUNITY)
Admission: EM | Admit: 2022-04-09 | Discharge: 2022-04-21 | DRG: 228 | Disposition: A | Discharge status: Skilled Nursing Facility | Payer: Medicare Other | Attending: Family Medicine | Admitting: Family Medicine

## 2022-04-09 DIAGNOSIS — I442 Atrioventricular block, complete: Secondary | ICD-10-CM | POA: Diagnosis present

## 2022-04-09 DIAGNOSIS — R8271 Bacteriuria: Secondary | ICD-10-CM

## 2022-04-09 DIAGNOSIS — F319 Bipolar disorder, unspecified: Secondary | ICD-10-CM | POA: Diagnosis present

## 2022-04-09 DIAGNOSIS — R6520 Severe sepsis without septic shock: Secondary | ICD-10-CM | POA: Diagnosis present

## 2022-04-09 DIAGNOSIS — Y831 Surgical operation with implant of artificial internal device as the cause of abnormal reaction of the patient, or of later complication, without mention of misadventure at the time of the procedure: Secondary | ICD-10-CM | POA: Diagnosis present

## 2022-04-09 DIAGNOSIS — R404 Transient alteration of awareness: Secondary | ICD-10-CM

## 2022-04-09 DIAGNOSIS — I700 Atherosclerosis of aorta: Secondary | ICD-10-CM | POA: Diagnosis present

## 2022-04-09 DIAGNOSIS — Z79899 Other long term (current) drug therapy: Secondary | ICD-10-CM

## 2022-04-09 DIAGNOSIS — Z006 Encounter for examination for normal comparison and control in clinical research program: Secondary | ICD-10-CM

## 2022-04-09 DIAGNOSIS — I2510 Atherosclerotic heart disease of native coronary artery without angina pectoris: Secondary | ICD-10-CM | POA: Diagnosis present

## 2022-04-09 DIAGNOSIS — Z20822 Contact with and (suspected) exposure to covid-19: Secondary | ICD-10-CM | POA: Diagnosis present

## 2022-04-09 DIAGNOSIS — H409 Unspecified glaucoma: Secondary | ICD-10-CM | POA: Diagnosis present

## 2022-04-09 DIAGNOSIS — R7881 Bacteremia: Secondary | ICD-10-CM | POA: Diagnosis present

## 2022-04-09 DIAGNOSIS — B955 Unspecified streptococcus as the cause of diseases classified elsewhere: Secondary | ICD-10-CM | POA: Diagnosis present

## 2022-04-09 DIAGNOSIS — Z823 Family history of stroke: Secondary | ICD-10-CM

## 2022-04-09 DIAGNOSIS — Z888 Allergy status to other drugs, medicaments and biological substances status: Secondary | ICD-10-CM

## 2022-04-09 DIAGNOSIS — Z923 Personal history of irradiation: Secondary | ICD-10-CM

## 2022-04-09 DIAGNOSIS — G4733 Obstructive sleep apnea (adult) (pediatric): Secondary | ICD-10-CM | POA: Diagnosis present

## 2022-04-09 DIAGNOSIS — Z8249 Family history of ischemic heart disease and other diseases of the circulatory system: Secondary | ICD-10-CM

## 2022-04-09 DIAGNOSIS — Z950 Presence of cardiac pacemaker: Secondary | ICD-10-CM

## 2022-04-09 DIAGNOSIS — Z886 Allergy status to analgesic agent status: Secondary | ICD-10-CM

## 2022-04-09 DIAGNOSIS — Z85821 Personal history of Merkel cell carcinoma: Secondary | ICD-10-CM

## 2022-04-09 DIAGNOSIS — E871 Hypo-osmolality and hyponatremia: Secondary | ICD-10-CM | POA: Diagnosis present

## 2022-04-09 DIAGNOSIS — Z7951 Long term (current) use of inhaled steroids: Secondary | ICD-10-CM

## 2022-04-09 DIAGNOSIS — T827XXA Infection and inflammatory reaction due to other cardiac and vascular devices, implants and grafts, initial encounter: Principal | ICD-10-CM | POA: Diagnosis present

## 2022-04-09 DIAGNOSIS — W19XXXA Unspecified fall, initial encounter: Secondary | ICD-10-CM | POA: Diagnosis present

## 2022-04-09 DIAGNOSIS — R6510 Systemic inflammatory response syndrome (SIRS) of non-infectious origin without acute organ dysfunction: Secondary | ICD-10-CM | POA: Diagnosis present

## 2022-04-09 DIAGNOSIS — I10 Essential (primary) hypertension: Secondary | ICD-10-CM | POA: Diagnosis not present

## 2022-04-09 DIAGNOSIS — I2489 Other forms of acute ischemic heart disease: Secondary | ICD-10-CM | POA: Diagnosis present

## 2022-04-09 DIAGNOSIS — I110 Hypertensive heart disease with heart failure: Secondary | ICD-10-CM | POA: Diagnosis present

## 2022-04-09 DIAGNOSIS — Z6838 Body mass index (BMI) 38.0-38.9, adult: Secondary | ICD-10-CM

## 2022-04-09 DIAGNOSIS — C4A9 Merkel cell carcinoma, unspecified: Secondary | ICD-10-CM | POA: Diagnosis present

## 2022-04-09 DIAGNOSIS — E669 Obesity, unspecified: Secondary | ICD-10-CM | POA: Diagnosis present

## 2022-04-09 DIAGNOSIS — Z9079 Acquired absence of other genital organ(s): Secondary | ICD-10-CM

## 2022-04-09 DIAGNOSIS — A401 Sepsis due to streptococcus, group B: Secondary | ICD-10-CM | POA: Diagnosis present

## 2022-04-09 DIAGNOSIS — E785 Hyperlipidemia, unspecified: Secondary | ICD-10-CM | POA: Diagnosis present

## 2022-04-09 DIAGNOSIS — Z7982 Long term (current) use of aspirin: Secondary | ICD-10-CM

## 2022-04-09 DIAGNOSIS — A419 Sepsis, unspecified organism: Principal | ICD-10-CM

## 2022-04-09 DIAGNOSIS — Z9049 Acquired absence of other specified parts of digestive tract: Secondary | ICD-10-CM

## 2022-04-09 DIAGNOSIS — Z8673 Personal history of transient ischemic attack (TIA), and cerebral infarction without residual deficits: Secondary | ICD-10-CM

## 2022-04-09 DIAGNOSIS — I5033 Acute on chronic diastolic (congestive) heart failure: Secondary | ICD-10-CM | POA: Diagnosis present

## 2022-04-09 DIAGNOSIS — I358 Other nonrheumatic aortic valve disorders: Secondary | ICD-10-CM | POA: Diagnosis present

## 2022-04-09 DIAGNOSIS — Z882 Allergy status to sulfonamides status: Secondary | ICD-10-CM

## 2022-04-09 LAB — PROTIME-INR
INR: 1.1 (ref 0.8–1.2)
Prothrombin Time: 14 seconds (ref 11.4–15.2)

## 2022-04-09 LAB — CBC WITH DIFFERENTIAL/PLATELET
Abs Immature Granulocytes: 0.27 10*3/uL — ABNORMAL HIGH (ref 0.00–0.07)
Basophils Absolute: 0 10*3/uL (ref 0.0–0.1)
Basophils Relative: 0 %
Eosinophils Absolute: 0 10*3/uL (ref 0.0–0.5)
Eosinophils Relative: 0 %
HCT: 40.3 % (ref 39.0–52.0)
Hemoglobin: 13.6 g/dL (ref 13.0–17.0)
Immature Granulocytes: 1 %
Lymphocytes Relative: 3 %
Lymphs Abs: 0.7 10*3/uL (ref 0.7–4.0)
MCH: 31.3 pg (ref 26.0–34.0)
MCHC: 33.7 g/dL (ref 30.0–36.0)
MCV: 92.9 fL (ref 80.0–100.0)
Monocytes Absolute: 1.9 10*3/uL — ABNORMAL HIGH (ref 0.1–1.0)
Monocytes Relative: 9 %
Neutro Abs: 17.5 10*3/uL — ABNORMAL HIGH (ref 1.7–7.7)
Neutrophils Relative %: 87 %
Platelets: 284 10*3/uL (ref 150–400)
RBC: 4.34 MIL/uL (ref 4.22–5.81)
RDW: 12.5 % (ref 11.5–15.5)
WBC: 20.4 10*3/uL — ABNORMAL HIGH (ref 4.0–10.5)
nRBC: 0 % (ref 0.0–0.2)

## 2022-04-09 LAB — COMPREHENSIVE METABOLIC PANEL
ALT: 26 U/L (ref 0–44)
AST: 33 U/L (ref 15–41)
Albumin: 3.5 g/dL (ref 3.5–5.0)
Alkaline Phosphatase: 66 U/L (ref 38–126)
Anion gap: 14 (ref 5–15)
BUN: 23 mg/dL (ref 8–23)
CO2: 20 mmol/L — ABNORMAL LOW (ref 22–32)
Calcium: 8.9 mg/dL (ref 8.9–10.3)
Chloride: 100 mmol/L (ref 98–111)
Creatinine, Ser: 1.24 mg/dL (ref 0.61–1.24)
GFR, Estimated: 60 mL/min — ABNORMAL LOW (ref >60)
Glucose, Bld: 120 mg/dL — ABNORMAL HIGH (ref 70–99)
Potassium: 3.6 mmol/L (ref 3.5–5.1)
Sodium: 134 mmol/L — ABNORMAL LOW (ref 135–145)
Total Bilirubin: 0.4 mg/dL (ref 0.3–1.2)
Total Protein: 7.3 g/dL (ref 6.5–8.1)

## 2022-04-09 LAB — BRAIN NATRIURETIC PEPTIDE: B Natriuretic Peptide: 61.7 pg/mL (ref 0.0–100.0)

## 2022-04-09 LAB — I-STAT CHEM 8, ED
BUN: 23 mg/dL (ref 8–23)
Calcium, Ion: 1.05 mmol/L — ABNORMAL LOW (ref 1.15–1.40)
Chloride: 101 mmol/L (ref 98–111)
Creatinine, Ser: 1 mg/dL (ref 0.61–1.24)
Glucose, Bld: 125 mg/dL — ABNORMAL HIGH (ref 70–99)
HCT: 40 % (ref 39.0–52.0)
Hemoglobin: 13.6 g/dL (ref 13.0–17.0)
Potassium: 3.7 mmol/L (ref 3.5–5.1)
Sodium: 135 mmol/L (ref 135–145)
TCO2: 22 mmol/L (ref 22–32)

## 2022-04-09 LAB — LACTIC ACID, PLASMA
Lactic Acid, Venous: 3.5 mmol/L (ref 0.5–1.9)
Lactic Acid, Venous: 2.6 mmol/L (ref 0.5–1.9)

## 2022-04-09 LAB — URINALYSIS, ROUTINE W REFLEX MICROSCOPIC
Bacteria, UA: NONE SEEN
Bilirubin Urine: NEGATIVE
Glucose, UA: NEGATIVE mg/dL
Ketones, ur: NEGATIVE mg/dL
Leukocytes,Ua: NEGATIVE
Nitrite: NEGATIVE
Protein, ur: 30 mg/dL — AB
Specific Gravity, Urine: 1.017 (ref 1.005–1.030)
pH: 6 (ref 5.0–8.0)

## 2022-04-09 LAB — TROPONIN I (HIGH SENSITIVITY)
Troponin I (High Sensitivity): 33 ng/L — ABNORMAL HIGH (ref <18)
Troponin I (High Sensitivity): 54 ng/L — ABNORMAL HIGH (ref <18)

## 2022-04-09 LAB — TYPE AND SCREEN
ABO/RH(D): A POS
Antibody Screen: NEGATIVE

## 2022-04-09 LAB — RESP PANEL BY RT-PCR (RSV, FLU A&B, COVID)  RVPGX2
Influenza A by PCR: NEGATIVE
Influenza B by PCR: NEGATIVE
Resp Syncytial Virus by PCR: NEGATIVE
SARS Coronavirus 2 by RT PCR: NEGATIVE

## 2022-04-09 LAB — LIPASE, BLOOD: Lipase: 39 U/L (ref 11–51)

## 2022-04-09 MED ORDER — ACETAMINOPHEN 325 MG PO TABS
650.0000 mg | ORAL_TABLET | Freq: Once | ORAL | Status: AC
  Administered 2022-04-09: 650 mg via ORAL
  Filled 2022-04-09: qty 2

## 2022-04-09 MED ORDER — ONDANSETRON HCL 4 MG PO TABS: 4.0000 mg | ORAL_TABLET | Freq: Four times a day (QID) | ORAL | Status: DC | PRN

## 2022-04-09 MED ORDER — MOMETASONE FURO-FORMOTEROL FUM 100-5 MCG/ACT IN AERO
2.0000 | INHALATION_SPRAY | Freq: Two times a day (BID) | RESPIRATORY_TRACT | Status: DC
  Administered 2022-04-10 – 2022-04-21 (×18): 2 via RESPIRATORY_TRACT
  Filled 2022-04-09 (×2): qty 8.8

## 2022-04-09 MED ORDER — VANCOMYCIN HCL IN DEXTROSE 1-5 GM/200ML-% IV SOLN: 1000.0000 mg | Freq: Once | INTRAVENOUS | Status: DC

## 2022-04-09 MED ORDER — SODIUM CHLORIDE 0.9 % IV BOLUS
500.0000 mL | Freq: Once | INTRAVENOUS | Status: AC
  Administered 2022-04-09: 500 mL via INTRAVENOUS

## 2022-04-09 MED ORDER — SODIUM CHLORIDE 0.9 % IV SOLN
2.0000 g | Freq: Three times a day (TID) | INTRAVENOUS | Status: DC
  Administered 2022-04-10: 2 g via INTRAVENOUS
  Filled 2022-04-09: qty 12.5

## 2022-04-09 MED ORDER — ASPIRIN 81 MG PO TBEC
81.0000 mg | DELAYED_RELEASE_TABLET | Freq: Every day | ORAL | Status: DC
  Administered 2022-04-10 – 2022-04-16 (×7): 81 mg via ORAL
  Filled 2022-04-09 (×7): qty 1

## 2022-04-09 MED ORDER — ATORVASTATIN CALCIUM 40 MG PO TABS
40.0000 mg | ORAL_TABLET | Freq: Every day | ORAL | Status: DC
  Administered 2022-04-10 – 2022-04-21 (×12): 40 mg via ORAL
  Filled 2022-04-09 (×12): qty 1

## 2022-04-09 MED ORDER — TAMSULOSIN HCL 0.4 MG PO CAPS
0.4000 mg | ORAL_CAPSULE | Freq: Every day | ORAL | Status: DC
  Administered 2022-04-09 – 2022-04-20 (×12): 0.4 mg via ORAL
  Filled 2022-04-09 (×12): qty 1

## 2022-04-09 MED ORDER — ENOXAPARIN SODIUM 40 MG/0.4ML IJ SOSY
40.0000 mg | PREFILLED_SYRINGE | INTRAMUSCULAR | Status: DC
  Administered 2022-04-10 – 2022-04-16 (×7): 40 mg via SUBCUTANEOUS
  Filled 2022-04-09 (×7): qty 0.4

## 2022-04-09 MED ORDER — VANCOMYCIN HCL 2000 MG/400ML IV SOLN: 2000.0000 mg | INTRAVENOUS | Status: DC

## 2022-04-09 MED ORDER — VANCOMYCIN HCL IN DEXTROSE 1-5 GM/200ML-% IV SOLN
1000.0000 mg | Freq: Once | INTRAVENOUS | Status: AC
  Administered 2022-04-09: 1000 mg via INTRAVENOUS
  Filled 2022-04-09: qty 200

## 2022-04-09 MED ORDER — LATANOPROST 0.005 % OP SOLN
1.0000 [drp] | Freq: Two times a day (BID) | OPHTHALMIC | Status: DC
  Administered 2022-04-10 – 2022-04-21 (×23): 1 [drp] via OPHTHALMIC
  Filled 2022-04-09 (×2): qty 2.5

## 2022-04-09 MED ORDER — PANTOPRAZOLE SODIUM 40 MG PO TBEC
40.0000 mg | DELAYED_RELEASE_TABLET | Freq: Two times a day (BID) | ORAL | Status: DC
  Administered 2022-04-09 – 2022-04-21 (×24): 40 mg via ORAL
  Filled 2022-04-09 (×24): qty 1

## 2022-04-09 MED ORDER — DORZOLAMIDE HCL 2 % OP SOLN
1.0000 [drp] | Freq: Two times a day (BID) | OPHTHALMIC | Status: DC
  Administered 2022-04-10 – 2022-04-21 (×23): 1 [drp] via OPHTHALMIC
  Filled 2022-04-09 (×2): qty 10

## 2022-04-09 MED ORDER — ALBUTEROL SULFATE (2.5 MG/3ML) 0.083% IN NEBU: 2.5000 mg | INHALATION_SOLUTION | Freq: Four times a day (QID) | RESPIRATORY_TRACT | Status: DC | PRN

## 2022-04-09 MED ORDER — GABAPENTIN 300 MG PO CAPS
300.0000 mg | ORAL_CAPSULE | Freq: Two times a day (BID) | ORAL | Status: DC
  Administered 2022-04-09 – 2022-04-21 (×24): 300 mg via ORAL
  Filled 2022-04-09 (×24): qty 1

## 2022-04-09 MED ORDER — ACETAMINOPHEN 650 MG RE SUPP: 650.0000 mg | Freq: Four times a day (QID) | RECTAL | Status: DC | PRN

## 2022-04-09 MED ORDER — ACETAMINOPHEN 325 MG PO TABS
650.0000 mg | ORAL_TABLET | Freq: Four times a day (QID) | ORAL | Status: DC | PRN
  Administered 2022-04-12 – 2022-04-20 (×11): 650 mg via ORAL
  Filled 2022-04-09 (×11): qty 2

## 2022-04-09 MED ORDER — LACTATED RINGERS IV SOLN: INTRAVENOUS | Status: DC

## 2022-04-09 MED ORDER — MELATONIN 5 MG PO TABS
10.0000 mg | ORAL_TABLET | Freq: Every day | ORAL | Status: DC
  Administered 2022-04-09 – 2022-04-20 (×11): 10 mg via ORAL
  Filled 2022-04-09 (×13): qty 2

## 2022-04-09 MED ORDER — METRONIDAZOLE 500 MG/100ML IV SOLN
500.0000 mg | Freq: Once | INTRAVENOUS | Status: AC
  Administered 2022-04-09: 500 mg via INTRAVENOUS
  Filled 2022-04-09: qty 100

## 2022-04-09 MED ORDER — SODIUM CHLORIDE 0.9 % IV SOLN
2.0000 g | Freq: Once | INTRAVENOUS | Status: AC
  Administered 2022-04-09: 2 g via INTRAVENOUS
  Filled 2022-04-09: qty 12.5

## 2022-04-09 MED ORDER — SODIUM CHLORIDE 0.9 % IV BOLUS
1000.0000 mL | Freq: Once | INTRAVENOUS | Status: AC
  Administered 2022-04-09: 1000 mL via INTRAVENOUS

## 2022-04-09 MED ORDER — DIVALPROEX SODIUM 500 MG PO DR TAB
1000.0000 mg | DELAYED_RELEASE_TABLET | Freq: Every day | ORAL | Status: DC
  Administered 2022-04-09 – 2022-04-20 (×12): 1000 mg via ORAL
  Filled 2022-04-09 (×9): qty 2
  Filled 2022-04-09 (×2): qty 4
  Filled 2022-04-09 (×3): qty 2

## 2022-04-09 MED ORDER — ONDANSETRON HCL 4 MG/2ML IJ SOLN
4.0000 mg | Freq: Once | INTRAMUSCULAR | Status: AC
  Administered 2022-04-09: 4 mg via INTRAVENOUS
  Filled 2022-04-09: qty 2

## 2022-04-09 MED ORDER — QUETIAPINE FUMARATE 50 MG PO TABS
300.0000 mg | ORAL_TABLET | Freq: Every day | ORAL | Status: DC
  Administered 2022-04-09 – 2022-04-20 (×12): 300 mg via ORAL
  Filled 2022-04-09 (×2): qty 6
  Filled 2022-04-09 (×2): qty 3
  Filled 2022-04-09: qty 6
  Filled 2022-04-09 (×3): qty 3
  Filled 2022-04-09: qty 6
  Filled 2022-04-09 (×3): qty 3

## 2022-04-09 MED ORDER — IOHEXOL 350 MG/ML SOLN
75.0000 mL | Freq: Once | INTRAVENOUS | Status: AC | PRN
  Administered 2022-04-09: 75 mL via INTRAVENOUS

## 2022-04-09 MED ORDER — ONDANSETRON HCL 4 MG/2ML IJ SOLN: 4.0000 mg | Freq: Four times a day (QID) | INTRAMUSCULAR | Status: DC | PRN

## 2022-04-09 MED ORDER — TIMOLOL MALEATE 0.5 % OP SOLN
1.0000 [drp] | Freq: Two times a day (BID) | OPHTHALMIC | Status: DC
  Administered 2022-04-09 – 2022-04-21 (×24): 1 [drp] via OPHTHALMIC
  Filled 2022-04-09 (×4): qty 5

## 2022-04-09 NOTE — ED Notes (Signed)
IV team at bedside 

## 2022-04-09 NOTE — Sepsis Progress Note (Signed)
Elink following for Sepsis Protocol 

## 2022-04-09 NOTE — H&P (Signed)
History and Physical    Patient: Douglas Perkins. SWF:[id number] DOB: 10-24-1945 DOA: 04/09/2022 DOS: the patient was seen and examined on 04/09/2022 PCP: [name], MD  Patient coming from: ALF/ILF  Chief Complaint:  Chief Complaint  Patient presents with   Shortness of Breath   HPI: Douglas Perkins. is a 76 y.o. 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  Pt presents to ED from [place].  Was found on the ground after an unwitnessed fall. Was nauseous and diaphoretic and vomited once. Found to be altered initially and sats were around 90% so placed on nonrebreather.  Here patient is confused about prior events but knows it is Sunday in December. He denies any specific complaints other than continues to be slightly nauseous. Denies any pain anywhere. Does not feel he is short of breath.  No headache, no abd pain, no leg pain, no groin pain.  No decubitus ulcer.   Review of Systems: As mentioned in the history of present illness. All other systems reviewed and are negative. Past Medical History:  Diagnosis Date   Bipolar 1 disorder ([place])    Cancer ([place])    prostate   Glaucoma    Hypertension    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[place])    Mural thrombus of cardiac apex    Presence of permanent cardiac pacemaker 2017   SA node dysfunction   Sleep apnea    does not use CPAP   TIA (transient ischemic attack)    Past Surgical History:  Procedure Laterality Date   CHOLECYSTECTOMY     COLONOSCOPY WITH PROPOFOL N/A 01/31/2017   Procedure: COLONOSCOPY WITH PROPOFOL;  Surgeon: Manya Silvas, MD;  Location: [facility] ENDOSCOPY;  Service: Endoscopy;  Laterality: N/A;   DRUG INDUCED ENDOSCOPY N/A 05/12/2020   Procedure: DRUG INDUCED ENDOSCOPY;  Surgeon: Melida Quitter, MD;  Location: [facility];  Service: ENT;  Laterality: N/A;   IMPLANTATION OF HYPOGLOSSAL NERVE STIMULATOR N/A 07/21/2020   Procedure:  IMPLANTATION OF HYPOGLOSSAL NERVE STIMULATOR;  Surgeon: Melida Quitter, MD;  Location: [facility];  Service: ENT;  Laterality: N/A;   LEG SURGERY Left    distal   Pace maker placement     PROSTATE SURGERY     PROSTATECTOMY     SKIN CANCER EXCISION  2006,2007   Merkle Cell Carcinoma   Social History:  reports that he has never smoked. He has never used smokeless tobacco. He reports that he does not drink alcohol and does not use drugs.  Allergies  Allergen Reactions   Other Hives, Shortness Of Breath and Other (See Comments)    Pt states that he is allergic to unwashed blood products.   Other reaction(s): Other (See Comments) UNWASHED BLOOD PRODUCTS  Pt states that he is allergic to unwashed blood products.     Feldene [Piroxicam] Hives   Sulfa Antibiotics Hives    Family History  Problem Relation Age of Onset   Stroke Father    CAD Paternal Grandmother    CAD Paternal Grandfather     Prior to Admission medications   Medication Sig Start Date End Date Taking? Authorizing Provider  albuterol (VENTOLIN HFA) 108 (90 Base) MCG/ACT inhaler Inhale 1 puff into the lungs 3 (three) times daily. Or inhale 2 puffs every 8 hours as needed for SOB or wheezing. 08/26/21  Yes [provider]  amLODipine (NORVASC) 10 MG tablet Take 1 tablet (  10 mg total) by mouth daily. 03/09/21  Yes Clapacs, Madie Reno, MD  aspirin EC 81 MG tablet Take 1 tablet (81 mg total) by mouth daily. Reported on 08/18/2015 03/09/21  Yes Clapacs, Madie Reno, MD  atorvastatin (LIPITOR) 40 MG tablet Take 1 tablet (40 mg total) by mouth daily. 03/10/21  Yes Clapacs, Madie Reno, MD  bisacodyl (DULCOLAX) 5 MG EC tablet Take 1 tablet (5 mg total) by mouth daily as needed for moderate constipation. 03/09/21  Yes Clapacs, Madie Reno, MD  carvedilol (COREG CR) 80 MG 24 hr capsule Take 80 mg by mouth daily.   Yes [provider]  diphenhydramine-acetaminophen (TYLENOL PM) 25-500 MG TABS tablet Take 1 tablet by mouth at  bedtime.   Yes [provider]  divalproex (DEPAKOTE) 500 MG DR tablet Take 2 tablets (1,000 mg total) by mouth at bedtime. 03/09/21  Yes Clapacs, Madie Reno, MD  dorzolamide (TRUSOPT) 2 % ophthalmic solution Place 1 drop into the right eye 2 (two) times daily. 03/09/21  Yes Clapacs, Madie Reno, MD  fluticasone-salmeterol (ADVAIR HFA) [phone] MCG/ACT inhaler Inhale 2 puffs into the lungs 2 (two) times daily.   Yes [provider]  gabapentin (NEURONTIN) 300 MG capsule Take 1 capsule (300 mg total) by mouth 2 (two) times daily. 03/09/21  Yes Clapacs, Madie Reno, MD  latanoprost (XALATAN) 0.005 % ophthalmic solution Place 1 drop into the right eye 2 (two) times daily. 03/09/21  Yes Clapacs, Madie Reno, MD  Melatonin 10 MG TABS Take 10 mg by mouth at bedtime.   Yes [provider]  Multiple Vitamins-Minerals (MULTIVITAL PO) Take 1 tablet by mouth daily.   Yes [provider]  pantoprazole (PROTONIX) 40 MG tablet Take 1 tablet (40 mg total) by mouth 2 (two) times daily. 03/09/21  Yes Clapacs, Madie Reno, MD  QUEtiapine (SEROQUEL) 300 MG tablet Take 1 tablet (300 mg total) by mouth at bedtime. 03/09/21  Yes Clapacs, Madie Reno, MD  tamsulosin (FLOMAX) 0.4 MG CAPS capsule Take 0.4 mg by mouth at bedtime.   Yes [provider]  timolol (TIMOPTIC) 0.5 % ophthalmic solution Place 1 drop into the right eye 2 (two) times daily. 03/09/21  Yes Gonzella Lex, MD    Physical Exam: Vitals:   04/09/22 2130 04/09/22 2145 04/09/22 2200 04/09/22 2220  BP: (!) 129/54 (!) 120/54 (!) 120/56 132/63  Pulse: 91 91 88 88  Resp: (!) 27 (!) 27 (!) 28 (!) 29  Temp:      TempSrc:      SpO2: 98% 92% 93% 99%  Weight:      Height:       Constitutional: NAD, calm, comfortable Eyes: PERRL, lids and conjunctivae normal ENMT: Mucous membranes are moist. Posterior pharynx clear of any exudate or lesions.Normal dentition.  Neck: normal, supple without meningismus, no masses, no thyromegaly Respiratory: clear to  auscultation bilaterally, no wheezing, no crackles. Normal respiratory effort. No accessory muscle use.  Cardiovascular: Regular rate and rhythm, no murmurs / rubs / gallops. 2+ BLE Edema. 2+ pedal pulses. No carotid bruits.  Abdomen: no tenderness, no masses palpated. No hepatosplenomegaly. Bowel sounds positive.  Musculoskeletal: no clubbing / cyanosis. No joint deformity upper and lower extremities. Good ROM, no contractures. Normal muscle tone.  Skin: Some skin thickening and erythema of R calf that patient also states is chronic, unchanged from baseline, and secondary to radiaiton therapy for Merkel cell carcinoma.  No TTP, exudate, open wound, nor creptius. R groin without any obvious erythema,  exudate, drainage, crepitus, tenderness.  Some fibrotic feeling changes on exam.  Pt says all this is chronic from radiation therapy as well. Neurologic: CN 2-12 grossly intact. Sensation intact, DTR normal. Strength 5/5 in all 4.  Psychiatric: Normal judgment and insight. Alert and oriented x 3. Normal mood.   Data Reviewed:    CT CAP: IMPRESSION: 1. No pulmonary embolism. No acute intrathoracic pathology identified. 2. Extensive multi-vessel coronary artery calcification. 3. Mild calcification of the aortic valve leaflets. Echocardiography may be helpful to assess the degree of valvular dysfunction. 4. Small epigastric ventral hernia containing a single loop of unremarkable mid transverse colon as well as small amount of mesenteric fat. Additional small bilobed left parasagittal ventral hernia containing mesenteric fat as well as a tiny umbilical hernia containing mesenteric fat. Small fat containing right inguinal hernia. 5. Infiltrative changes within the right inguinal region as well as shotty right inguinal adenopathy which may be posttraumatic or inflammatory in nature. Correlation with clinical examination is recommended. 6. Extensive atherosclerotic calcification within the right  common femoral artery resulting in probable near occlusion of the right common femoral artery. If indicated, this would be better assessed with CT arteriography. Aortic Atherosclerosis (ICD10-I70.0).     Latest Ref Rng & Units 04/09/2022    5:48 PM 04/09/2022    5:37 PM 03/02/2021    4:34 PM  CBC  WBC 4.0 - 10.5 K/uL  20.4  9.5   Hemoglobin 13.0 - 17.0 g/dL 13.6  13.6  11.3   Hematocrit 39.0 - 52.0 % 40.0  40.3  32.1   Platelets 150 - 400 K/uL  284  346       Latest Ref Rng & Units 04/09/2022    5:48 PM 04/09/2022    5:37 PM 03/08/2021    7:06 AM  CMP  Glucose 70 - 99 mg/dL 125  120  104   BUN 8 - 23 mg/dL '23  23  11   '$ Creatinine 0.61 - 1.24 mg/dL 1.00  1.24  0.71   Sodium 135 - 145 mmol/L 135  134  136   Potassium 3.5 - 5.1 mmol/L 3.7  3.6  4.3   Chloride 98 - 111 mmol/L 101  100  102   CO2 22 - 32 mmol/L  20  26   Calcium 8.9 - 10.3 mg/dL  8.9  9.2   Total Protein 6.5 - 8.1 g/dL  7.3    Total Bilirubin 0.3 - 1.2 mg/dL  0.4    Alkaline Phos 38 - 126 U/L  66    AST 15 - 41 U/L  33    ALT 0 - 44 U/L  26     Urinalysis    Component Value Date/Time   COLORURINE YELLOW 04/09/2022 2135   APPEARANCEUR HAZY (A) 04/09/2022 2135   APPEARANCEUR Clear 05/24/2015 1328   LABSPEC 1.017 04/09/2022 2135   PHURINE 6.0 04/09/2022 2135   GLUCOSEU NEGATIVE 04/09/2022 2135   HGBUR SMALL (A) 04/09/2022 2135   BILIRUBINUR NEGATIVE 04/09/2022 2135   BILIRUBINUR Negative 05/24/2015 [place] 04/09/2022 2135   PROTEINUR 30 (A) 04/09/2022 2135   NITRITE NEGATIVE 04/09/2022 2135   LEUKOCYTESUR NEGATIVE 04/09/2022 2135    COVID, FLU, and RSV = negative  Lactate 3.5 -> 2.6  Trop 33 and 54  Assessment and Plan: * SIRS (systemic inflammatory response syndrome) (HCC) Pt with SIRS and unknown source. Work up thus far: No headache nor meningismus COVID, FLU, RSV all negative CTA chest =  neg for PNA or PE CT AP = area of inflammation in R groin that patient says is known,  chronic, and secondary to prior radiation therapy for his Merkel cell carcinoma, No erythema or obvious infectious findings on physical exam Some skin thickening and erythema of R calf that patient also states is chronic, unchanged from baseline, and secondary to radiaiton therapy for Merkel cell carcinoma.  No TTP, exudate, open wound, nor creptius. UA neg BCx pending  Plan: Sepsis pathway Empiric cefepime, flagyl, vanc for the moment Got IVF bolus and maint Trend lactates (trending down at this time) Tele monitor BCx pending Procalcitonin in AM  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HCC) S/p radiation therapy to R leg (calf through groin) with chronic skin changes, radiation fibrosis changes. Pt clearly states that skin changes to calf are chronic and unchanged from baseline, denies any pain.  Also says groin has known radiation changes, feels unchanged from baseline he states, no obvious erythema of groin or findings on exam worrisome for infection.  Benign essential hypertension Hold home BP meds in setting of SIRS      Advance Care Planning:   Code Status: Full Code   Consults: None  Family Communication: No family in room  Severity of Illness: The appropriate patient status for this patient is OBSERVATION. Observation status is judged to be reasonable and necessary in order to provide the required intensity of service to ensure the patient's safety. The patient's presenting symptoms, physical exam findings, and initial radiographic and laboratory data in the context of their medical condition is felt to place them at decreased risk for further clinical deterioration. Furthermore, it is anticipated that the patient will be medically stable for discharge from the hospital within 2 midnights of admission.   Author: Etta Quill., DO 04/09/2022 11:10 PM  For on call review [email or web address].

## 2022-04-09 NOTE — Assessment & Plan Note (Addendum)
Pt with SIRS and unknown source. Work up thus far: No headache nor meningismus COVID, FLU, RSV all negative CTA chest = neg for PNA or PE CT AP = area of inflammation in R groin that patient says is known, chronic, and secondary to prior radiation therapy for his Merkel cell carcinoma, No erythema or obvious infectious findings on physical exam Some skin thickening and erythema of R calf that patient also states is chronic, unchanged from baseline, and secondary to radiaiton therapy for Merkel cell carcinoma.  No TTP, exudate, open wound, nor creptius. UA neg BCx pending  Plan: Sepsis pathway Empiric cefepime, flagyl, vanc for the moment Got IVF bolus and maint Trend lactates (trending down at this time) Tele monitor BCx pending Procalcitonin in AM

## 2022-04-09 NOTE — Progress Notes (Signed)
Pharmacy Antibiotic Note  Douglas Perkins. is a 76 y.o. male admitted on 04/09/2022 with Sepsis.  Pharmacy has been consulted for Vancomycin and Cefepime dosing.  CrCl 70-80 mL/min  Plan: Vancomycin '2000mg'$  Q24H (eAUC 471, Scr 1, Vd 0.72) Cefepime 2g Q8H  F/u cultures for de-escalation Monitor renal function for dose adjustments  Height: 6' (182.9 cm) Weight: 97.5 kg (214 lb 15.2 oz) IBW/kg (Calculated) : 77.6  Temp (24hrs), Avg:102.2 F (39 C), Min:102.2 F (39 C), Max:102.2 F (39 C)  Recent Labs  Lab 04/09/22 1725 04/09/22 1737 04/09/22 1748  WBC  --  20.4*  --   CREATININE  --   --  1.00  LATICACIDVEN 3.5*  --   --     Estimated Creatinine Clearance: 73.7 mL/min (by C-G formula based on SCr of 1 mg/dL).    Allergies  Allergen Reactions   Other Hives, Shortness Of Breath and Other (See Comments)    Pt states that he is allergic to unwashed blood products.   Other reaction(s): Other (See Comments) UNWASHED BLOOD PRODUCTS  Pt states that he is allergic to unwashed blood products.     Feldene [Piroxicam] Hives   Sulfa Antibiotics Hives    Antimicrobials this admission: Vancomycin 12/10 >> Cefepime 12/10 >>  Microbiology results: 12/10 Bcx: pending 12/10 MRSA: pending  Thank you for allowing pharmacy to be a part of this patient's care.  Merrilee Jansky, PharmD Clinical Pharmacist 04/09/2022 6:50 PM

## 2022-04-09 NOTE — ED Triage Notes (Signed)
The pt is alert and oriented   he just vomited approx 362m undigested folod  pain in the rt sholulder  3/10

## 2022-04-09 NOTE — ED Notes (Signed)
Dr. Fabio Neighbors at bedside.

## 2022-04-09 NOTE — ED Notes (Signed)
Pt returned from CT scan.

## 2022-04-09 NOTE — Assessment & Plan Note (Addendum)
Hold home BP meds in setting of SIRS

## 2022-04-09 NOTE — ED Triage Notes (Signed)
The pt arrived by  [facility] from [facility]  the pt fell earlier today  unknown time   he is alert on arrival following commands  he does not rfemember the fall  no blood thinner    respirations rapid  hr id c/o  some lower abd he vomityed just now  approx 314m undigested food    alert and pain  skin hot and moist  he re nemains nauseated

## 2022-04-09 NOTE — Assessment & Plan Note (Signed)
S/p radiation therapy to R leg (calf through groin) with chronic skin changes, radiation fibrosis changes. Pt clearly states that skin changes to calf are chronic and unchanged from baseline, denies any pain.  Also says groin has known radiation changes, feels unchanged from baseline he states, no obvious erythema of groin or findings on exam worrisome for infection.

## 2022-04-09 NOTE — ED Provider Notes (Signed)
[facility] EMERGENCY DEPARTMENT Provider Note   CSN: [id number] Arrival date & time: 04/09/22  1713     History {Add pertinent medical, surgical, social history, OB history to HPI:1} Chief Complaint  Patient presents with   Shortness of Breath    Douglas Perkins. is a 76 y.o. male.  He is brought in by EMS from his facility at independent living.  Was found on the ground after an unwitnessed fall.  Was nauseous and diaphoretic and vomited once.  Found to be altered initially and sats were around 90% so placed on nonrebreather.  Here patient is confused about prior events but knows it is Sunday in December.  He denies any specific complaints other than continues to be slightly nauseous.  Denies any pain anywhere.  Does not feel he is short of breath.  The history is provided by the patient and the EMS personnel.  Altered Mental Status Presenting symptoms: confusion   Most recent episode:  Today Episode history:  Single Timing:  Constant Progression:  Improving Chronicity:  New Associated symptoms: difficulty breathing, nausea and vomiting   Associated symptoms: no abdominal pain, no fever, no headaches and no weakness        Home Medications Prior to Admission medications   Medication Sig Start Date End Date Taking? Authorizing Provider  amLODipine (NORVASC) 10 MG tablet Take 1 tablet (10 mg total) by mouth daily. 03/09/21   Clapacs, Madie Reno, MD  aspirin EC 81 MG tablet Take 1 tablet (81 mg total) by mouth daily. Reported on 08/18/2015 03/09/21   Clapacs, Madie Reno, MD  atorvastatin (LIPITOR) 40 MG tablet Take 1 tablet (40 mg total) by mouth daily. 03/10/21   Clapacs, Madie Reno, MD  bisacodyl (DULCOLAX) 5 MG EC tablet Take 1 tablet (5 mg total) by mouth daily as needed for moderate constipation. 03/09/21   Clapacs, Madie Reno, MD  carvedilol (COREG) 25 MG tablet Take 1 tablet (25 mg total) by mouth in the morning and at bedtime. 03/09/21 10/18/22  Clapacs, Madie Reno, MD   divalproex (DEPAKOTE) 500 MG DR tablet Take 2 tablets (1,000 mg total) by mouth at bedtime. 03/09/21   Clapacs, Madie Reno, MD  dorzolamide (TRUSOPT) 2 % ophthalmic solution Place 1 drop into the right eye 2 (two) times daily. 03/09/21   Clapacs, Madie Reno, MD  gabapentin (NEURONTIN) 300 MG capsule Take 1 capsule (300 mg total) by mouth 2 (two) times daily. 03/09/21   Clapacs, Madie Reno, MD  latanoprost (XALATAN) 0.005 % ophthalmic solution Place 1 drop into the right eye 2 (two) times daily. 03/09/21   Clapacs, Madie Reno, MD  metoprolol succinate (TOPROL-XL) 25 MG 24 hr tablet Take 1 tablet (25 mg total) by mouth daily. 03/10/21   Clapacs, Madie Reno, MD  Omega-3 Fatty Acids (FISH OIL) 1000 MG CAPS Take 1 capsule (1,000 mg total) by mouth 2 (two) times daily. 03/09/21   Clapacs, Madie Reno, MD  pantoprazole (PROTONIX) 40 MG tablet Take 1 tablet (40 mg total) by mouth 2 (two) times daily. 03/09/21   Clapacs, Madie Reno, MD  QUEtiapine (SEROQUEL) 300 MG tablet Take 1 tablet (300 mg total) by mouth at bedtime. 03/09/21   Clapacs, Madie Reno, MD  timolol (TIMOPTIC) 0.5 % ophthalmic solution Place 1 drop into the right eye 2 (two) times daily. 03/09/21   Clapacs, Madie Reno, MD      Allergies    Other, Feldene [piroxicam], and Sulfa antibiotics    Review of Systems  Review of Systems  Constitutional:  Negative for fever.  Eyes:  Negative for visual disturbance.  Respiratory:  Negative for shortness of breath.   Gastrointestinal:  Positive for nausea and vomiting. Negative for abdominal pain.  Musculoskeletal:  Negative for neck pain.  Neurological:  Negative for weakness, numbness and headaches.  Psychiatric/Behavioral:  Positive for confusion.     Physical Exam Updated Vital Signs There were no vitals taken for this visit. Physical Exam Vitals and nursing note reviewed.  Constitutional:      General: He is not in acute distress.    Appearance: He is well-developed. He is obese. He is diaphoretic.  HENT:     Head:  Normocephalic and atraumatic.  Eyes:     Conjunctiva/sclera: Conjunctivae normal.  Cardiovascular:     Rate and Rhythm: Regular rhythm. Tachycardia present.     Heart sounds: No murmur heard. Pulmonary:     Effort: Pulmonary effort is normal. Tachypnea present. No respiratory distress.     Breath sounds: Normal breath sounds.  Abdominal:     Palpations: Abdomen is soft.     Tenderness: There is no abdominal tenderness. There is no guarding or rebound.  Musculoskeletal:        General: No swelling.     Cervical back: Neck supple.     Right lower leg: Edema present.     Left lower leg: Edema present.  Skin:    General: Skin is warm.     Capillary Refill: Capillary refill takes less than 2 seconds.  Neurological:     General: No focal deficit present.     Mental Status: He is alert and oriented to person, place, and time.     Sensory: No sensory deficit.     Motor: No weakness.     ED Results / Procedures / Treatments   Labs (all labs ordered are listed, but only abnormal results are displayed) Labs Reviewed  RESP PANEL BY RT-PCR (RSV, FLU A&B, COVID)  [id number]  CULTURE, BLOOD (ROUTINE X 2)  CULTURE, BLOOD (ROUTINE X 2)  LACTIC ACID, PLASMA  LACTIC ACID, PLASMA  COMPREHENSIVE METABOLIC PANEL  LIPASE, BLOOD  BRAIN NATRIURETIC PEPTIDE  CBC WITH DIFFERENTIAL/PLATELET  PROTIME-INR  URINALYSIS, ROUTINE W REFLEX MICROSCOPIC  I-STAT CHEM 8, ED  TYPE AND SCREEN  TROPONIN I (HIGH SENSITIVITY)    EKG None  Radiology No results found.  Procedures Procedures  {Document cardiac monitor, telemetry assessment procedure when appropriate:1}  Medications Ordered in ED Medications - No data to display  ED Course/ Medical Decision Making/ A&P                           Medical Decision Making Amount and/or Complexity of Data Reviewed Labs: ordered. Radiology: ordered.   This patient complains of ***; this involves an extensive number of treatment Options and is a  complaint that carries with it a high risk of complications and morbidity. The differential includes ***  I ordered, reviewed and interpreted labs, which included *** I ordered medication *** and reviewed PMP when indicated. I ordered imaging studies which included *** and I independently    visualized and interpreted imaging which showed *** Additional history obtained from *** Previous records obtained and reviewed *** I consulted *** and discussed lab and imaging findings and discussed disposition.  Cardiac monitoring reviewed, *** Social determinants considered, *** Critical Interventions: ***  After the interventions stated above, I reevaluated the patient and found *** Admission  and further testing considered, ***   {Document critical care time when appropriate:1} {Document review of labs and clinical decision tools ie heart score, Chads2Vasc2 etc:1}  {Document your independent review of radiology images, and any outside records:1} {Document your discussion with family members, caretakers, and with consultants:1} {Document social determinants of health affecting pt's care:1} {Document your decision making why or why not admission, treatments were needed:1} Final Clinical Impression(s) / ED Diagnoses Final diagnoses:  None    Rx / DC Orders ED Discharge Orders     None

## 2022-04-10 DIAGNOSIS — Z79899 Other long term (current) drug therapy: Secondary | ICD-10-CM | POA: Diagnosis not present

## 2022-04-10 DIAGNOSIS — R8271 Bacteriuria: Secondary | ICD-10-CM

## 2022-04-10 DIAGNOSIS — I10 Essential (primary) hypertension: Secondary | ICD-10-CM | POA: Diagnosis not present

## 2022-04-10 DIAGNOSIS — A419 Sepsis, unspecified organism: Secondary | ICD-10-CM | POA: Diagnosis present

## 2022-04-10 DIAGNOSIS — Z923 Personal history of irradiation: Secondary | ICD-10-CM | POA: Diagnosis not present

## 2022-04-10 DIAGNOSIS — R7881 Bacteremia: Secondary | ICD-10-CM | POA: Diagnosis not present

## 2022-04-10 DIAGNOSIS — R404 Transient alteration of awareness: Secondary | ICD-10-CM | POA: Diagnosis present

## 2022-04-10 DIAGNOSIS — E785 Hyperlipidemia, unspecified: Secondary | ICD-10-CM | POA: Diagnosis present

## 2022-04-10 DIAGNOSIS — E871 Hypo-osmolality and hyponatremia: Secondary | ICD-10-CM | POA: Diagnosis present

## 2022-04-10 DIAGNOSIS — R6520 Severe sepsis without septic shock: Secondary | ICD-10-CM

## 2022-04-10 DIAGNOSIS — T827XXA Infection and inflammatory reaction due to other cardiac and vascular devices, implants and grafts, initial encounter: Secondary | ICD-10-CM | POA: Diagnosis present

## 2022-04-10 DIAGNOSIS — G9340 Encephalopathy, unspecified: Secondary | ICD-10-CM

## 2022-04-10 DIAGNOSIS — R079 Chest pain, unspecified: Secondary | ICD-10-CM | POA: Diagnosis not present

## 2022-04-10 DIAGNOSIS — R6510 Systemic inflammatory response syndrome (SIRS) of non-infectious origin without acute organ dysfunction: Secondary | ICD-10-CM | POA: Diagnosis not present

## 2022-04-10 DIAGNOSIS — I5033 Acute on chronic diastolic (congestive) heart failure: Secondary | ICD-10-CM | POA: Diagnosis present

## 2022-04-10 DIAGNOSIS — I110 Hypertensive heart disease with heart failure: Secondary | ICD-10-CM | POA: Diagnosis present

## 2022-04-10 DIAGNOSIS — Z006 Encounter for examination for normal comparison and control in clinical research program: Secondary | ICD-10-CM | POA: Diagnosis not present

## 2022-04-10 DIAGNOSIS — I442 Atrioventricular block, complete: Secondary | ICD-10-CM | POA: Diagnosis present

## 2022-04-10 DIAGNOSIS — E669 Obesity, unspecified: Secondary | ICD-10-CM | POA: Diagnosis present

## 2022-04-10 DIAGNOSIS — M7989 Other specified soft tissue disorders: Secondary | ICD-10-CM | POA: Diagnosis not present

## 2022-04-10 DIAGNOSIS — C4A9 Merkel cell carcinoma, unspecified: Secondary | ICD-10-CM

## 2022-04-10 DIAGNOSIS — F319 Bipolar disorder, unspecified: Secondary | ICD-10-CM | POA: Diagnosis not present

## 2022-04-10 DIAGNOSIS — I495 Sick sinus syndrome: Secondary | ICD-10-CM | POA: Diagnosis not present

## 2022-04-10 DIAGNOSIS — H409 Unspecified glaucoma: Secondary | ICD-10-CM | POA: Diagnosis present

## 2022-04-10 DIAGNOSIS — T827XXD Infection and inflammatory reaction due to other cardiac and vascular devices, implants and grafts, subsequent encounter: Secondary | ICD-10-CM | POA: Diagnosis not present

## 2022-04-10 DIAGNOSIS — G4733 Obstructive sleep apnea (adult) (pediatric): Secondary | ICD-10-CM

## 2022-04-10 DIAGNOSIS — G4730 Sleep apnea, unspecified: Secondary | ICD-10-CM | POA: Diagnosis not present

## 2022-04-10 DIAGNOSIS — Y831 Surgical operation with implant of artificial internal device as the cause of abnormal reaction of the patient, or of later complication, without mention of misadventure at the time of the procedure: Secondary | ICD-10-CM | POA: Diagnosis present

## 2022-04-10 DIAGNOSIS — I700 Atherosclerosis of aorta: Secondary | ICD-10-CM | POA: Diagnosis present

## 2022-04-10 DIAGNOSIS — R609 Edema, unspecified: Secondary | ICD-10-CM | POA: Diagnosis not present

## 2022-04-10 DIAGNOSIS — Z20822 Contact with and (suspected) exposure to covid-19: Secondary | ICD-10-CM | POA: Diagnosis present

## 2022-04-10 DIAGNOSIS — Z950 Presence of cardiac pacemaker: Secondary | ICD-10-CM | POA: Diagnosis not present

## 2022-04-10 DIAGNOSIS — A401 Sepsis due to streptococcus, group B: Secondary | ICD-10-CM | POA: Diagnosis present

## 2022-04-10 DIAGNOSIS — I2489 Other forms of acute ischemic heart disease: Secondary | ICD-10-CM | POA: Diagnosis present

## 2022-04-10 DIAGNOSIS — B955 Unspecified streptococcus as the cause of diseases classified elsewhere: Secondary | ICD-10-CM | POA: Diagnosis not present

## 2022-04-10 DIAGNOSIS — Z8249 Family history of ischemic heart disease and other diseases of the circulatory system: Secondary | ICD-10-CM | POA: Diagnosis not present

## 2022-04-10 DIAGNOSIS — T82897A Other specified complication of cardiac prosthetic devices, implants and grafts, initial encounter: Secondary | ICD-10-CM | POA: Diagnosis not present

## 2022-04-10 DIAGNOSIS — Z85821 Personal history of Merkel cell carcinoma: Secondary | ICD-10-CM | POA: Diagnosis not present

## 2022-04-10 DIAGNOSIS — Z6838 Body mass index (BMI) 38.0-38.9, adult: Secondary | ICD-10-CM | POA: Diagnosis not present

## 2022-04-10 DIAGNOSIS — T827XXS Infection and inflammatory reaction due to other cardiac and vascular devices, implants and grafts, sequela: Secondary | ICD-10-CM | POA: Diagnosis not present

## 2022-04-10 DIAGNOSIS — W19XXXA Unspecified fall, initial encounter: Secondary | ICD-10-CM | POA: Diagnosis present

## 2022-04-10 DIAGNOSIS — I2510 Atherosclerotic heart disease of native coronary artery without angina pectoris: Secondary | ICD-10-CM | POA: Diagnosis present

## 2022-04-10 DIAGNOSIS — B951 Streptococcus, group B, as the cause of diseases classified elsewhere: Secondary | ICD-10-CM

## 2022-04-10 DIAGNOSIS — I358 Other nonrheumatic aortic valve disorders: Secondary | ICD-10-CM | POA: Diagnosis present

## 2022-04-10 LAB — COMPREHENSIVE METABOLIC PANEL
ALT: 26 U/L (ref 0–44)
AST: 56 U/L — ABNORMAL HIGH (ref 15–41)
Albumin: 2.8 g/dL — ABNORMAL LOW (ref 3.5–5.0)
Alkaline Phosphatase: 52 U/L (ref 38–126)
Anion gap: 10 (ref 5–15)
BUN: 17 mg/dL (ref 8–23)
CO2: 24 mmol/L (ref 22–32)
Calcium: 7.9 mg/dL — ABNORMAL LOW (ref 8.9–10.3)
Chloride: 100 mmol/L (ref 98–111)
Creatinine, Ser: 0.88 mg/dL (ref 0.61–1.24)
GFR, Estimated: >60 mL/min (ref >60)
Glucose, Bld: 150 mg/dL — ABNORMAL HIGH (ref 70–99)
Potassium: 3.8 mmol/L (ref 3.5–5.1)
Sodium: 134 mmol/L — ABNORMAL LOW (ref 135–145)
Total Bilirubin: 0.5 mg/dL (ref 0.3–1.2)
Total Protein: 6.4 g/dL — ABNORMAL LOW (ref 6.5–8.1)

## 2022-04-10 LAB — BLOOD CULTURE ID PANEL (REFLEXED) - BCID2

## 2022-04-10 LAB — CBC
HCT: 35 % — ABNORMAL LOW (ref 39.0–52.0)
Hemoglobin: 12 g/dL — ABNORMAL LOW (ref 13.0–17.0)
MCH: 32 pg (ref 26.0–34.0)
MCHC: 34.3 g/dL (ref 30.0–36.0)
MCV: 93.3 fL (ref 80.0–100.0)
Platelets: 226 10*3/uL (ref 150–400)
RBC: 3.75 MIL/uL — ABNORMAL LOW (ref 4.22–5.81)
RDW: 12.9 % (ref 11.5–15.5)
WBC: 31.7 10*3/uL — ABNORMAL HIGH (ref 4.0–10.5)
nRBC: 0 % (ref 0.0–0.2)

## 2022-04-10 LAB — CORTISOL-AM, BLOOD: Cortisol - AM: 21.7 ug/dL (ref 6.7–22.6)

## 2022-04-10 LAB — PROTIME-INR
INR: 1.2 (ref 0.8–1.2)
Prothrombin Time: 14.6 seconds (ref 11.4–15.2)

## 2022-04-10 LAB — PROCALCITONIN: Procalcitonin: 18.83 ng/mL

## 2022-04-10 LAB — LACTIC ACID, PLASMA
Lactic Acid, Venous: 2 mmol/L (ref 0.5–1.9)
Lactic Acid, Venous: 2.2 mmol/L (ref 0.5–1.9)

## 2022-04-10 LAB — MRSA NEXT GEN BY PCR, NASAL: MRSA by PCR Next Gen: NOT DETECTED

## 2022-04-10 LAB — CK: Total CK: 847 U/L — ABNORMAL HIGH (ref 49–397)

## 2022-04-10 MED ORDER — PENICILLIN G POTASSIUM 20000000 UNITS IJ SOLR
12.0000 10*6.[IU] | Freq: Two times a day (BID) | INTRAVENOUS | Status: DC
  Administered 2022-04-10 – 2022-04-20 (×21): 12 10*6.[IU] via INTRAVENOUS
  Filled 2022-04-10 (×24): qty 12

## 2022-04-10 MED ORDER — LACTATED RINGERS IV SOLN: INTRAVENOUS | Status: AC

## 2022-04-10 MED ORDER — METRONIDAZOLE 500 MG/100ML IV SOLN
500.0000 mg | Freq: Two times a day (BID) | INTRAVENOUS | Status: DC
  Administered 2022-04-10: 500 mg via INTRAVENOUS
  Filled 2022-04-10: qty 100

## 2022-04-10 NOTE — Consult Note (Signed)
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 Sleep apnea with implanted inspire device and chest --he says he needs his remote control delivered from home. Hyperlipidemia Coronary artery disease  Plan:  Narrowed to penicillin TTE and will need TEE Cardiology and more importantly EP need to be consulted re his pacemaker--given implanted for complete heart block I doubt that they would be enthusiastic about taking this out and if they did so he would need to have a temporary pacemaker.  We may have to instead treat him IV antibiotics followed by lifelong oral suppressive antibiotics Repeat blood cultures Monitor for metastatic sites of infection  Principal Problem:   SIRS (systemic inflammatory response syndrome) (HCC) Active Problems:   Benign essential hypertension   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HCC)   Scheduled Meds:  aspirin EC  81 mg Oral Daily   atorvastatin  40 mg Oral Daily   divalproex  1,000 mg Oral QHS   dorzolamide  1 drop Right Eye BID   enoxaparin (LOVENOX) injection  40 mg Subcutaneous Q24H   gabapentin  300 mg Oral BID   latanoprost  1 drop Right Eye BID   melatonin  10 mg Oral QHS   mometasone-formoterol  2 puff Inhalation BID   pantoprazole  40 mg Oral BID   QUEtiapine  300 mg Oral QHS   tamsulosin  0.4 mg Oral QHS   timolol  1 drop Right Eye BID   Continuous Infusions:  lactated ringers 75 mL/hr at 04/10/22 1246   penicillin G potassium 12 Million Units in dextrose 5 % 500 mL continuous infusion     PRN Meds:.acetaminophen **OR** acetaminophen, albuterol, ondansetron **OR** ondansetron (ZOFRAN) IV  HPI: Arda Keadle. is a  76 y.o. 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  He was nauseous and diaphoretic.  He was confused and did not recall events on Saturday and Sunday.  In the ER at [facility] blood cultures were taken CT  T angiogram failed to show evidence of pulmonary embolism CT abdomen pelvis showed an epigastric ventral hernia was infiltrated changes in the right inguinal region  His blood cultures have separately come back positive for group B streptococcus in 2 out of 2 sites.  He has erythema in his right leg where he is receiving radiation therapy though it is not clear to me that he has cellulitis there.  He also has an erythematous area over his elbow but free range of motion and no pain or tenderness in the elbow.  Will narrow him to penicillin.  He needs a 2D echocardiogram and a TEE.  Cardiology and EP need to be consulted.  With his group B streptococcus bacteremia and his pacemaker in place 1 needs to presume the pacemaker is infected.  That is the case to affect cure he would need explantation of his pacemaker, placement of temporary pacemaker and 6 weeks of IV antibiotics prior to  reimplantation of new pacemaker.  I suspect to be more likely we will give him 6 weeks of systemic antibiotics followed by lifelong suppressive antibiotics.  I spent 84 minutes with the patient including than 50% of the time in face to face counseling of the patient re his GBS bacteremia with PM, personally reviewing CT angiogram CT abdomen pelvis along with review of medical records in preparation for the visit and during the visit and in coordination of nis care.   Review of Systems: Review of Systems  Unable to perform ROS: Critical illness     Past Medical History:  Diagnosis Date   Bipolar 1 disorder ([place])    Cancer ([place])    prostate   Glaucoma    Hypertension    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[place])    Mural thrombus of cardiac apex    Presence of permanent cardiac pacemaker 2017   SA node dysfunction   Sleep apnea    does not use CPAP   TIA (transient ischemic attack)     Social History   Tobacco Use   Smoking status: Never   Smokeless tobacco: Never  Substance Use Topics   Alcohol use: No    Alcohol/week: 0.0 standard drinks of alcohol   Drug use: No    Family History  Problem Relation Age of Onset   Stroke Father    CAD Paternal Grandmother    CAD Paternal Grandfather    Allergies  Allergen Reactions   Other Hives, Shortness Of Breath and Other (See Comments)    Pt states that he is allergic to unwashed blood products.   Other reaction(s): Other (See Comments) UNWASHED BLOOD PRODUCTS  Pt states that he is allergic to unwashed blood products.     Feldene [Piroxicam] Hives   Sulfa Antibiotics Hives    OBJECTIVE: Blood pressure (!) 102/54, pulse 73, temperature 97.7 F (36.5 C), temperature source Oral, resp. rate (!) 32, height 6' (1.829 m), weight 97.5 kg, SpO2 93 %.  Physical Exam Constitutional:      Appearance: He is well-developed. He is obese.  HENT:     Head: Normocephalic and atraumatic.  Eyes:     General:        Right eye: No discharge.        Left eye: No discharge.     Conjunctiva/sclera: Conjunctivae normal.  Cardiovascular:     Rate and Rhythm: Normal rate and regular rhythm.     Heart sounds: No murmur heard.    No friction rub. No gallop.  Pulmonary:     Effort: Tachypnea present. No respiratory distress.     Breath sounds: No wheezing.  Abdominal:     General: There is no distension.     Palpations: Abdomen is soft.  Musculoskeletal:        General: No tenderness. Normal range of motion.     Cervical back: Normal range of motion and neck supple.  Skin:    General: Skin is  warm and dry.     Coloration: Skin is not pale.     Findings: No erythema or rash.  Neurological:     General: No focal deficit present.     Mental Status: He is alert and oriented to person, place, and time.  Psychiatric:        Mood and Affect: Mood normal.        Behavior: Behavior normal.        Thought Content: Thought content normal.  Judgment: Judgment normal.    Right leg 04/10/2022    Right elbow 04/10/2022:    Lab Results Lab Results  Component Value Date   WBC 31.7 (H) 04/10/2022   HGB 12.0 (L) 04/10/2022   HCT 35.0 (L) 04/10/2022   MCV 93.3 04/10/2022   PLT 226 04/10/2022    Lab Results  Component Value Date   CREATININE 0.88 04/10/2022   BUN 17 04/10/2022   NA 134 (L) 04/10/2022   K 3.8 04/10/2022   CL 100 04/10/2022   CO2 24 04/10/2022    Lab Results  Component Value Date   ALT 26 04/10/2022   AST 56 (H) 04/10/2022   ALKPHOS 52 04/10/2022   BILITOT 0.5 04/10/2022     Microbiology: Recent Results (from the past 240 hour(s))  Resp panel by RT-PCR (RSV, Flu A&B, Covid) Anterior Nasal Swab     Status: None   Collection Time: 04/09/22  5:18 PM   Specimen: Anterior Nasal Swab  Result Value Ref Range Status   SARS Coronavirus 2 by RT PCR NEGATIVE NEGATIVE Final    Comment: (NOTE) SARS-CoV-2 target nucleic acids are NOT DETECTED.  The SARS-CoV-2 RNA is generally detectable in upper respiratory specimens during the acute phase of infection. The lowest concentration of SARS-CoV-2 viral copies this assay can detect is 138 copies/mL. A negative result does not preclude SARS-Cov-2 infection and should not be used as the sole basis for treatment or other patient management decisions. A negative result may occur with  improper specimen collection/handling, submission of specimen other than nasopharyngeal swab, presence of viral mutation(s) within the areas targeted by this assay, and inadequate number of viral copies(<138 copies/mL). A negative  result must be combined with clinical observations, patient history, and epidemiological information. The expected result is Negative.  Fact Sheet for Patients:  EntrepreneurPulse.com.au  Fact Sheet for Healthcare Providers:  IncredibleEmployment.be  This test is no t yet approved or cleared by the [place] FDA and  has been authorized for detection and/or diagnosis of SARS-CoV-2 by FDA under an Emergency Use Authorization (EUA). This EUA will remain  in effect (meaning this test can be used) for the duration of the COVID-19 declaration under Section 564(b)(1) of the Act, 21 U.S.C.section 360bbb-3(b)(1), unless the authorization is terminated  or revoked sooner.       Influenza A by PCR NEGATIVE NEGATIVE Final   Influenza B by PCR NEGATIVE NEGATIVE Final    Comment: (NOTE) The Xpert Xpress SARS-CoV-2/FLU/RSV plus assay is intended as an aid in the diagnosis of influenza from Nasopharyngeal swab specimens and should not be used as a sole basis for treatment. Nasal washings and aspirates are unacceptable for Xpert Xpress SARS-CoV-2/FLU/RSV testing.  Fact Sheet for Patients: EntrepreneurPulse.com.au  Fact Sheet for Healthcare Providers: IncredibleEmployment.be  This test is not yet approved or cleared by the [place] FDA and has been authorized for detection and/or diagnosis of SARS-CoV-2 by FDA under an Emergency Use Authorization (EUA). This EUA will remain in effect (meaning this test can be used) for the duration of the COVID-19 declaration under Section 564(b)(1) of the Act, 21 U.S.C. section 360bbb-3(b)(1), unless the authorization is terminated or revoked.     Resp Syncytial Virus by PCR NEGATIVE NEGATIVE Final    Comment: (NOTE) Fact Sheet for Patients: EntrepreneurPulse.com.au  Fact Sheet for Healthcare Providers: IncredibleEmployment.be  This  test is not yet approved or cleared by the [place] FDA and has been authorized for detection and/or diagnosis of SARS-CoV-2 by FDA  under an Emergency Use Authorization (EUA). This EUA will remain in effect (meaning this test can be used) for the duration of the COVID-19 declaration under Section 564(b)(1) of the Act, 21 U.S.C. section 360bbb-3(b)(1), unless the authorization is terminated or revoked.  Performed at [facility], [place] [address] [place], [place] [address]   Culture, blood (routine x 2)     Status: None (Preliminary result)   Collection Time: 04/09/22  5:24 PM   Specimen: BLOOD RIGHT HAND  Result Value Ref Range Status   Specimen Description BLOOD RIGHT HAND  Final   Special Requests   Final    BOTTLES DRAWN AEROBIC AND ANAEROBIC Blood Culture adequate volume   Culture  Setup Time   Final    GRAM POSITIVE COCCI IN CHAINS IN BOTH AEROBIC AND ANAEROBIC BOTTLES CRITICAL RESULT CALLED TO, READ BACK BY AND VERIFIED WITH: [place] D [id number] FCP Performed at [facility], [place] [address], [place], [place] [address]    Culture GRAM POSITIVE COCCI  Final   Report Status PENDING  Incomplete  Blood Culture ID Panel (Reflexed)     Status: Abnormal   Collection Time: 04/09/22  5:24 PM  Result Value Ref Range Status   Enterococcus faecalis NOT DETECTED NOT DETECTED Final   Enterococcus Faecium NOT DETECTED NOT DETECTED Final   Listeria monocytogenes NOT DETECTED NOT DETECTED Final   Staphylococcus species NOT DETECTED NOT DETECTED Final   Staphylococcus aureus (BCID) NOT DETECTED NOT DETECTED Final   Staphylococcus epidermidis NOT DETECTED NOT DETECTED Final   Staphylococcus lugdunensis NOT DETECTED NOT DETECTED Final   Streptococcus species DETECTED (A) NOT DETECTED Final    Comment: CRITICAL RESULT CALLED TO, READ BACK BY AND VERIFIED WITH: PHARMD ELIZABETH M [id number] FCP    Streptococcus agalactiae DETECTED (A) NOT DETECTED Final    Comment:  CRITICAL RESULT CALLED TO, READ BACK BY AND VERIFIED WITH: PHARMD ELIZABETH M [id number] FCP    Streptococcus pneumoniae NOT DETECTED NOT DETECTED Final   Streptococcus pyogenes NOT DETECTED NOT DETECTED Final   A.calcoaceticus-baumannii NOT DETECTED NOT DETECTED Final   Bacteroides fragilis NOT DETECTED NOT DETECTED Final   Enterobacterales NOT DETECTED NOT DETECTED Final   Enterobacter cloacae complex NOT DETECTED NOT DETECTED Final   Escherichia coli NOT DETECTED NOT DETECTED Final   Klebsiella aerogenes NOT DETECTED NOT DETECTED Final   Klebsiella oxytoca NOT DETECTED NOT DETECTED Final   Klebsiella pneumoniae NOT DETECTED NOT DETECTED Final   Proteus species NOT DETECTED NOT DETECTED Final   Salmonella species NOT DETECTED NOT DETECTED Final   Serratia marcescens NOT DETECTED NOT DETECTED Final   Haemophilus influenzae NOT DETECTED NOT DETECTED Final   Neisseria meningitidis NOT DETECTED NOT DETECTED Final   Pseudomonas aeruginosa NOT DETECTED NOT DETECTED Final   Stenotrophomonas maltophilia NOT DETECTED NOT DETECTED Final   Candida albicans NOT DETECTED NOT DETECTED Final   Candida auris NOT DETECTED NOT DETECTED Final   Candida glabrata NOT DETECTED NOT DETECTED Final   Candida krusei NOT DETECTED NOT DETECTED Final   Candida parapsilosis NOT DETECTED NOT DETECTED Final   Candida tropicalis NOT DETECTED NOT DETECTED Final   Cryptococcus neoformans/gattii NOT DETECTED NOT DETECTED Final    Comment: Performed at [facility], [address] [place],  [address]  Culture, blood (routine x 2)     Status: None (Preliminary result)   Collection Time: 04/09/22  5:37 PM   Specimen: BLOOD LEFT HAND  Result  Value Ref Range Status   Specimen Description BLOOD LEFT HAND  Final   Special Requests   Final    BOTTLES DRAWN AEROBIC AND ANAEROBIC Blood Culture adequate volume   Culture  Setup Time   Final    GRAM POSITIVE COCCI IN CHAINS IN BOTH AEROBIC AND ANAEROBIC  BOTTLES CRITICAL VALUE NOTED.  VALUE IS CONSISTENT WITH PREVIOUSLY REPORTED AND CALLED VALUE. Performed at [facility], [place] [address], [place], [place] [address]    Culture GRAM POSITIVE COCCI  Final   Report Status PENDING  Incomplete  MRSA Next Gen by PCR, Nasal     Status: None   Collection Time: 04/10/22  6:25 AM   Specimen: Nasal Mucosa; Nasal Swab  Result Value Ref Range Status   MRSA by PCR Next Gen NOT DETECTED NOT DETECTED Final    Comment: (NOTE) The GeneXpert MRSA Assay (FDA approved for NASAL specimens only), is one component of a comprehensive MRSA colonization surveillance program. It is not intended to diagnose MRSA infection nor to guide or monitor treatment for MRSA infections. Test performance is not FDA approved in patients less than 80 years old. Performed at [facility], [place] [address] [place], [place] [address]     Alcide Evener, [place] [place] [facility] [phone] pager  04/10/2022, 1:14 PM

## 2022-04-10 NOTE — ED Notes (Signed)
Pt cleaned up of urinary incontinence. Linens changed. Tolerated well.

## 2022-04-10 NOTE — ED Notes (Signed)
MD notified of difficulty obtaining ordered blood work and request for midline/PICC placement. Awaiting response.

## 2022-04-10 NOTE — Progress Notes (Signed)
PROGRESS NOTE  Douglas Perkins. ZOX:[id number] DOB: [birth year] DOA: 04/09/2022 PCP: [name], MD  HPI/Recap of past 24 hours: Douglas Perkins. is a 76 y.o. 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 Pt presents to ED from [place].  Was found on the ground after an unwitnessed fall. Was nauseous and diaphoretic and vomited once. Found to be altered initially and sats were around 90% so placed on nonrebreather. In the ED, noted to be septic, started on broad-spectrum antibiotics. Patient admitted for further management.   Today, patient appeared very lethargic, able to tell me he was in the hospital, but unsure why or what happened prior to being in the hospital.  Sleepy, hard of hearing patient noted to be bacteremic.  ID following due to reflex consult.    Assessment/Plan: Principal Problem:   SIRS (systemic inflammatory response syndrome) (HCC) Active Problems:   Benign essential hypertension   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HCC)   Severe sepsis likely 2/2 Group B streptococcal bacteremia On admission leukocytosis, febrile, tachycardic, tachypneic, lactic acid of 3.5 Currently afebrile, with marked leukocytosis Procalcitonin elevated, lactic acid, will trend BC X 2 growing group B streptococcus, repeat pending UA negative CTA chest neg for PNA or PE CT A/p: Noted area of inflammation in right groin secondary to prior radiation therapy ID on board, appreciate recs TTE ordered, TEE scheduled for 12/15 (pacemaker) Cardiology and EP consulted ID narrowed broad-spectrum antibiotics to penicillin Continue IV fluids, appears responsive, monitor respiratory status closely Telemetry, progressive floor, low threshold for ICU  Hypertension BP noted to be soft likely 2/2 sepsis Continue IV fluids Hold home Coreg, amlodipine  CAD/HLD Mildly elevated troponin-likely demand ischemia Echo pending Denies any chest  pain Continue aspirin, Lipitor  Merkel cell carcinoma S/p radiation therapy CT showing area of inflammation in right groin  Obstructive sleep apnea Does have implantable inspire device      Estimated body mass index is 29.15 kg/m as calculated from the following:   Height as of this encounter: 6' (1.829 m).   Weight as of this encounter: 97.5 kg.     Code Status: Full  Family Communication: None at bedside  Disposition Plan: Status is: Observation The patient will require care spanning > 2 midnights and should be moved to inpatient because: Level of care      Consultants: ID  Procedures: None  Antimicrobials: Penicillin G  DVT prophylaxis: Lovenox   Objective: Vitals:   04/10/22 1315 04/10/22 1330 04/10/22 1345 04/10/22 1400  BP: (!) 125/93 122/70 (!) 110/46   Pulse: 80 78 81   Resp: 19 (!) 21 20   Temp:   97.6 F (36.4 C) 98 F (36.7 C)  TempSrc:   Oral   SpO2: 100% 100% 99%   Weight:      Height:        Intake/Output Summary (Last 24 hours) at 04/10/2022 1433 Last data filed at 04/10/2022 1246 Gross per 24 hour  Intake 2134.89 ml  Output 600 ml  Net 1534.89 ml   Filed Weights   04/09/22 1727  Weight: 97.5 kg    Exam: General: NAD, lethargic, sleepy, hard of hearing Cardiovascular: S1, S2 present Respiratory: CTAB, tachypneic Abdomen: Soft, nontender, nondistended, bowel sounds present Musculoskeletal: +1 RLE edema, with noted erythema of right calf, LLE with trace edema Skin: Noted as above Psychiatry: Unable to assess    Data Reviewed: CBC: Recent Labs  Lab  04/09/22 1737 04/09/22 1748 04/10/22 0500  WBC 20.4*  --  31.7*  NEUTROABS 17.5*  --   --   HGB 13.6 13.6 12.0*  HCT 40.3 40.0 35.0*  MCV 92.9  --  93.3  PLT 284  --  244   Basic Metabolic Panel: Recent Labs  Lab 04/09/22 1737 04/09/22 1748 04/10/22 0500  NA 134* 135 134*  K 3.6 3.7 3.8  CL 100 101 100  CO2 20*  --  24  GLUCOSE 120* 125* 150*  BUN '23 23 17   '$ CREATININE 1.24 1.00 0.88  CALCIUM 8.9  --  7.9*   GFR: Estimated Creatinine Clearance: 83.8 mL/min (by C-G formula based on SCr of 0.88 mg/dL). Liver Function Tests: Recent Labs  Lab 04/09/22 1737 04/10/22 0500  AST 33 56*  ALT 26 26  ALKPHOS 66 52  BILITOT 0.4 0.5  PROT 7.3 6.4*  ALBUMIN 3.5 2.8*   Recent Labs  Lab 04/09/22 1737  LIPASE 39   No results for input(s): "AMMONIA" in the last 168 hours. Coagulation Profile: Recent Labs  Lab 04/09/22 1737 04/10/22 0500  INR 1.1 1.2   Cardiac Enzymes: Recent Labs  Lab 04/09/22 2010  CKTOTAL 847*   BNP (last 3 results) No results for input(s): "PROBNP" in the last 8760 hours. HbA1C: No results for input(s): "HGBA1C" in the last 72 hours. CBG: No results for input(s): "GLUCAP" in the last 168 hours. Lipid Profile: No results for input(s): "CHOL", "HDL", "LDLCALC", "TRIG", "CHOLHDL", "LDLDIRECT" in the last 72 hours. Thyroid Function Tests: No results for input(s): "TSH", "T4TOTAL", "FREET4", "T3FREE", "THYROIDAB" in the last 72 hours. Anemia Panel: No results for input(s): "VITAMINB12", "FOLATE", "FERRITIN", "TIBC", "IRON", "RETICCTPCT" in the last 72 hours. Urine analysis:    Component Value Date/Time   COLORURINE YELLOW 04/09/2022 2135   APPEARANCEUR HAZY (A) 04/09/2022 2135   APPEARANCEUR Clear 05/24/2015 1328   LABSPEC 1.017 04/09/2022 2135   PHURINE 6.0 04/09/2022 2135   GLUCOSEU NEGATIVE 04/09/2022 2135   HGBUR SMALL (A) 04/09/2022 2135   BILIRUBINUR NEGATIVE 04/09/2022 2135   BILIRUBINUR Negative 05/24/2015 [facility] 04/09/2022 2135   PROTEINUR 30 (A) 04/09/2022 2135   NITRITE NEGATIVE 04/09/2022 2135   LEUKOCYTESUR NEGATIVE 04/09/2022 2135   Sepsis Labs: '@LABRCNTIP'$ (procalcitonin:4,lacticidven:4)  ) Recent Results (from the past 240 hour(s))  Resp panel by RT-PCR (RSV, Flu A&B, Covid) Anterior Nasal Swab     Status: None   Collection Time: 04/09/22  5:18 PM   Specimen: Anterior  Nasal Swab  Result Value Ref Range Status   SARS Coronavirus 2 by RT PCR NEGATIVE NEGATIVE Final    Comment: (NOTE) SARS-CoV-2 target nucleic acids are NOT DETECTED.  The SARS-CoV-2 RNA is generally detectable in upper respiratory specimens during the acute phase of infection. The lowest concentration of SARS-CoV-2 viral copies this assay can detect is 138 copies/mL. A negative result does not preclude SARS-Cov-2 infection and should not be used as the sole basis for treatment or other patient management decisions. A negative result may occur with  improper specimen collection/handling, submission of specimen other than nasopharyngeal swab, presence of viral mutation(s) within the areas targeted by this assay, and inadequate number of viral copies(<138 copies/mL). A negative result must be combined with clinical observations, patient history, and epidemiological information. The expected result is Negative.  Fact Sheet for Patients:  EntrepreneurPulse.com.au  Fact Sheet for Healthcare Providers:  IncredibleEmployment.be  This test is no t yet approved or cleared by the [place]  FDA and  has been authorized for detection and/or diagnosis of SARS-CoV-2 by FDA under an Emergency Use Authorization (EUA). This EUA will remain  in effect (meaning this test can be used) for the duration of the COVID-19 declaration under Section 564(b)(1) of the Act, 21 U.S.C.section 360bbb-3(b)(1), unless the authorization is terminated  or revoked sooner.       Influenza A by PCR NEGATIVE NEGATIVE Final   Influenza B by PCR NEGATIVE NEGATIVE Final    Comment: (NOTE) The Xpert Xpress SARS-CoV-2/FLU/RSV plus assay is intended as an aid in the diagnosis of influenza from Nasopharyngeal swab specimens and should not be used as a sole basis for treatment. Nasal washings and aspirates are unacceptable for Xpert Xpress SARS-CoV-2/FLU/RSV testing.  Fact Sheet for  Patients: EntrepreneurPulse.com.au  Fact Sheet for Healthcare Providers: IncredibleEmployment.be  This test is not yet approved or cleared by the [place] FDA and has been authorized for detection and/or diagnosis of SARS-CoV-2 by FDA under an Emergency Use Authorization (EUA). This EUA will remain in effect (meaning this test can be used) for the duration of the COVID-19 declaration under Section 564(b)(1) of the Act, 21 U.S.C. section 360bbb-3(b)(1), unless the authorization is terminated or revoked.     Resp Syncytial Virus by PCR NEGATIVE NEGATIVE Final    Comment: (NOTE) Fact Sheet for Patients: EntrepreneurPulse.com.au  Fact Sheet for Healthcare Providers: IncredibleEmployment.be  This test is not yet approved or cleared by the [place] FDA and has been authorized for detection and/or diagnosis of SARS-CoV-2 by FDA under an Emergency Use Authorization (EUA). This EUA will remain in effect (meaning this test can be used) for the duration of the COVID-19 declaration under Section 564(b)(1) of the Act, 21 U.S.C. section 360bbb-3(b)(1), unless the authorization is terminated or revoked.  Performed at [facility], [place] [address] [place], [place] [address]   Culture, blood (routine x 2)     Status: None (Preliminary result)   Collection Time: 04/09/22  5:24 PM   Specimen: BLOOD RIGHT HAND  Result Value Ref Range Status   Specimen Description BLOOD RIGHT HAND  Final   Special Requests   Final    BOTTLES DRAWN AEROBIC AND ANAEROBIC Blood Culture adequate volume   Culture  Setup Time   Final    GRAM POSITIVE COCCI IN CHAINS IN BOTH AEROBIC AND ANAEROBIC BOTTLES CRITICAL RESULT CALLED TO, READ BACK BY AND VERIFIED WITH: Agar K [id number] FCP Performed at [facility], [place] [address], [place], [place] [address]    Culture GRAM POSITIVE COCCI  Final   Report Status  PENDING  Incomplete  Blood Culture ID Panel (Reflexed)     Status: Abnormal   Collection Time: 04/09/22  5:24 PM  Result Value Ref Range Status   Enterococcus faecalis NOT DETECTED NOT DETECTED Final   Enterococcus Faecium NOT DETECTED NOT DETECTED Final   Listeria monocytogenes NOT DETECTED NOT DETECTED Final   Staphylococcus species NOT DETECTED NOT DETECTED Final   Staphylococcus aureus (BCID) NOT DETECTED NOT DETECTED Final   Staphylococcus epidermidis NOT DETECTED NOT DETECTED Final   Staphylococcus lugdunensis NOT DETECTED NOT DETECTED Final   Streptococcus species DETECTED (A) NOT DETECTED Final    Comment: CRITICAL RESULT CALLED TO, READ BACK BY AND VERIFIED WITH: PHARMD ELIZABETH M [id number] FCP    Streptococcus agalactiae DETECTED (A) NOT DETECTED Final    Comment: CRITICAL RESULT CALLED TO, READ BACK BY AND VERIFIED WITH: Prosperity M 1011 [id number]  FCP    Streptococcus pneumoniae NOT DETECTED NOT DETECTED Final   Streptococcus pyogenes NOT DETECTED NOT DETECTED Final   A.calcoaceticus-baumannii NOT DETECTED NOT DETECTED Final   Bacteroides fragilis NOT DETECTED NOT DETECTED Final   Enterobacterales NOT DETECTED NOT DETECTED Final   Enterobacter cloacae complex NOT DETECTED NOT DETECTED Final   Escherichia coli NOT DETECTED NOT DETECTED Final   Klebsiella aerogenes NOT DETECTED NOT DETECTED Final   Klebsiella oxytoca NOT DETECTED NOT DETECTED Final   Klebsiella pneumoniae NOT DETECTED NOT DETECTED Final   Proteus species NOT DETECTED NOT DETECTED Final   Salmonella species NOT DETECTED NOT DETECTED Final   Serratia marcescens NOT DETECTED NOT DETECTED Final   Haemophilus influenzae NOT DETECTED NOT DETECTED Final   Neisseria meningitidis NOT DETECTED NOT DETECTED Final   Pseudomonas aeruginosa NOT DETECTED NOT DETECTED Final   Stenotrophomonas maltophilia NOT DETECTED NOT DETECTED Final   Candida albicans NOT DETECTED NOT DETECTED Final   Candida auris NOT  DETECTED NOT DETECTED Final   Candida glabrata NOT DETECTED NOT DETECTED Final   Candida krusei NOT DETECTED NOT DETECTED Final   Candida parapsilosis NOT DETECTED NOT DETECTED Final   Candida tropicalis NOT DETECTED NOT DETECTED Final   Cryptococcus neoformans/gattii NOT DETECTED NOT DETECTED Final    Comment: Performed at [facility], [address] [place], [place] [address]  Culture, blood (routine x 2)     Status: None (Preliminary result)   Collection Time: 04/09/22  5:37 PM   Specimen: BLOOD LEFT HAND  Result Value Ref Range Status   Specimen Description BLOOD LEFT HAND  Final   Special Requests   Final    BOTTLES DRAWN AEROBIC AND ANAEROBIC Blood Culture adequate volume   Culture  Setup Time   Final    GRAM POSITIVE COCCI IN CHAINS IN BOTH AEROBIC AND ANAEROBIC BOTTLES CRITICAL VALUE NOTED.  VALUE IS CONSISTENT WITH PREVIOUSLY REPORTED AND CALLED VALUE. Performed at [facility], [place] [address], [place], [place] [address]    Culture GRAM POSITIVE COCCI  Final   Report Status PENDING  Incomplete  MRSA Next Gen by PCR, Nasal     Status: None   Collection Time: 04/10/22  6:25 AM   Specimen: Nasal Mucosa; Nasal Swab  Result Value Ref Range Status   MRSA by PCR Next Gen NOT DETECTED NOT DETECTED Final    Comment: (NOTE) The GeneXpert MRSA Assay (FDA approved for NASAL specimens only), is one component of a comprehensive MRSA colonization surveillance program. It is not intended to diagnose MRSA infection nor to guide or monitor treatment for MRSA infections. Test performance is not FDA approved in patients less than 53 years old. Performed at [facility], [place] [address] [place], [place] [address]       Studies: CT Angio Chest PE W/Cm &/Or Wo Cm  Result Date: 04/09/2022 CLINICAL DATA:  Pulmonary embolism (PE) suspected, high prob; Sepsis. EXAM: CT ANGIOGRAPHY CHEST CT ABDOMEN AND PELVIS WITH CONTRAST TECHNIQUE: Multidetector CT imaging of the chest  was performed using the standard protocol during bolus administration of intravenous contrast. Multiplanar CT image reconstructions and MIPs were obtained to evaluate the vascular anatomy. Multidetector CT imaging of the abdomen and pelvis was performed using the standard protocol during bolus administration of intravenous contrast. RADIATION DOSE REDUCTION: This exam was performed according to the departmental dose-optimization program which includes automated exposure control, adjustment of the mA and/or kV according to patient size and/or use of iterative  reconstruction technique. CONTRAST:  22m OMNIPAQUE IOHEXOL 350 MG/ML SOLN COMPARISON:  CT chest 06/12/2016, CT abdomen pelvis 06/23/2015 FINDINGS: CTA CHEST FINDINGS Cardiovascular: There is adequate opacification of the pulmonary arterial tree. No intraluminal filling defect identified to suggest acute pulmonary embolism through the segmental level. The central pulmonary arteries are of normal caliber. Extensive multi-vessel coronary artery calcification. Mild calcification of the aortic valve leaflets. Global cardiac size within normal limits. No pericardial effusion. Mild atherosclerotic calcification within the thoracic aorta. No aortic aneurysm. 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 Mediastinum/Nodes: No enlarged mediastinal, hilar, or axillary lymph nodes. Thyroid gland, trachea, and esophagus demonstrate no significant findings. Lungs/Pleura: Mild bibasilar atelectasis. Lungs are otherwise clear. No pneumothorax or pleural effusion. Musculoskeletal: Neurostimulator battery pack is seen within the right anterior chest wall with its single lead extending into the right neck. Healed left rib fracture noted. No acute bone abnormality. Review of the MIP images confirms the above findings. CT ABDOMEN and PELVIS FINDINGS Hepatobiliary: Tiny cyst within the left hepatic lobe. Liver otherwise  unremarkable. Gallbladder absent. No intra or extrahepatic biliary ductal dilation. Pancreas: Unremarkable Spleen: Unremarkable Adrenals/Urinary Tract: The adrenal glands are unremarkable. The kidneys are normal in size and position. Left renal cortical scarring within the interpolar region. Mild bilateral nonspecific perinephric stranding. No loculated perinephric fluid collections. No intrarenal or ureteral calculi. No hydronephrosis. The bladder is unremarkable. Stomach/Bowel: Small epigastric ventral hernia contains a single loop of unremarkable mid transverse colon as well as small amount of mesenteric fat. The stomach, small bowel, and large bowel are otherwise unremarkable. Appendix normal. No free intraperitoneal gas or fluid. Vascular/Lymphatic: Aortic atherosclerosis. No aortic aneurysm. Extensive atherosclerotic calcification results in probable near occlusion of the right common femoral artery though this is not well assessed on this non arteriographic study. No pathologic abdominal or pelvic lymph nodes. Reproductive: Status post prostatectomy. Other: In addition to above-mentioned ventral hernia containing the transverse colon, a separate small bilobed left parasagittal ventral hernia is present containing mesenteric fat as well as a tiny umbilical hernia containing mesenteric fat. Small right fat containing inguinal hernia is present. Musculoskeletal: There is infiltrative changes within the right inguinal region as well as shotty right inguinal adenopathy which may be posttraumatic or inflammatory in nature. Healed left inferior and superior pubic rami fractures are noted. Degenerative changes are seen within the lumbar spine. No acute bone abnormality. Review of the MIP images confirms the above findings. IMPRESSION: 1. No pulmonary embolism. No acute intrathoracic pathology identified. 2. Extensive multi-vessel coronary artery calcification. 3. Mild calcification of the aortic valve leaflets.  Echocardiography may be helpful to assess the degree of valvular dysfunction. 4. Small epigastric ventral hernia containing a single loop of unremarkable mid transverse colon as well as small amount of mesenteric fat. Additional small bilobed left parasagittal ventral hernia containing mesenteric fat as well as a tiny umbilical hernia containing mesenteric fat. Small fat containing right inguinal hernia. 5. Infiltrative changes within the right inguinal region as well as shotty right inguinal adenopathy which may be posttraumatic or inflammatory in nature. Correlation with clinical examination is recommended. 6. Extensive atherosclerotic calcification within the right common femoral artery resulting in probable near occlusion of the right common femoral artery. If indicated, this would be better assessed with CT arteriography. Aortic Atherosclerosis (ICD10-I70.0). Electronically Signed   By: AFidela SalisburyM.D.   On: 04/09/2022 21:46   CT Abdomen Pelvis W Contrast  Result Date: 04/09/2022 CLINICAL DATA:  Pulmonary embolism (PE) suspected, high prob; Sepsis. EXAM: CT ANGIOGRAPHY CHEST CT ABDOMEN AND PELVIS WITH CONTRAST TECHNIQUE: Multidetector CT imaging of the chest was performed using the standard protocol during bolus administration of intravenous contrast. Multiplanar CT image reconstructions and MIPs were obtained to evaluate the vascular anatomy. Multidetector CT imaging of the abdomen and pelvis was performed using the standard protocol during bolus administration of intravenous contrast. RADIATION DOSE REDUCTION: This exam was performed according to the departmental dose-optimization program which includes automated exposure control, adjustment of the mA and/or kV according to patient size and/or use of iterative reconstruction technique. CONTRAST:  36m OMNIPAQUE IOHEXOL 350 MG/ML SOLN COMPARISON:  CT chest 06/12/2016, CT abdomen pelvis 06/23/2015 FINDINGS: CTA CHEST FINDINGS Cardiovascular: There is  adequate opacification of the pulmonary arterial tree. No intraluminal filling defect identified to suggest acute pulmonary embolism through the segmental level. The central pulmonary arteries are of normal caliber. Extensive multi-vessel coronary artery calcification. Mild calcification of the aortic valve leaflets. Global cardiac size within normal limits. No pericardial effusion. Mild atherosclerotic calcification within the thoracic aorta. No aortic aneurysm. 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 Mediastinum/Nodes: No enlarged mediastinal, hilar, or axillary lymph nodes. Thyroid gland, trachea, and esophagus demonstrate no significant findings. Lungs/Pleura: Mild bibasilar atelectasis. Lungs are otherwise clear. No pneumothorax or pleural effusion. Musculoskeletal: Neurostimulator battery pack is seen within the right anterior chest wall with its single lead extending into the right neck. Healed left rib fracture noted. No acute bone abnormality. Review of the MIP images confirms the above findings. CT ABDOMEN and PELVIS FINDINGS Hepatobiliary: Tiny cyst within the left hepatic lobe. Liver otherwise unremarkable. Gallbladder absent. No intra or extrahepatic biliary ductal dilation. Pancreas: Unremarkable Spleen: Unremarkable Adrenals/Urinary Tract: The adrenal glands are unremarkable. The kidneys are normal in size and position. Left renal cortical scarring within the interpolar region. Mild bilateral nonspecific perinephric stranding. No loculated perinephric fluid collections. No intrarenal or ureteral calculi. No hydronephrosis. The bladder is unremarkable. Stomach/Bowel: Small epigastric ventral hernia contains a single loop of unremarkable mid transverse colon as well as small amount of mesenteric fat. The stomach, small bowel, and large bowel are otherwise unremarkable. Appendix normal. No free intraperitoneal gas or fluid. Vascular/Lymphatic:  Aortic atherosclerosis. No aortic aneurysm. Extensive atherosclerotic calcification results in probable near occlusion of the right common femoral artery though this is not well assessed on this non arteriographic study. No pathologic abdominal or pelvic lymph nodes. Reproductive: Status post prostatectomy. Other: In addition to above-mentioned ventral hernia containing the transverse colon, a separate small bilobed left parasagittal ventral hernia is present containing mesenteric fat as well as a tiny umbilical hernia containing mesenteric fat. Small right fat containing inguinal hernia is present. Musculoskeletal: There is infiltrative changes within the right inguinal region as well as shotty right inguinal adenopathy which may be posttraumatic or inflammatory in nature. Healed left inferior and superior pubic rami fractures are noted. Degenerative changes are seen within the lumbar spine. No acute bone abnormality. Review of the MIP images confirms the above findings. IMPRESSION: 1. No pulmonary embolism. No acute intrathoracic pathology identified. 2. Extensive multi-vessel coronary artery calcification. 3. Mild calcification of the aortic valve leaflets. Echocardiography may be helpful to assess the degree of valvular dysfunction. 4. Small epigastric ventral hernia containing a single loop of unremarkable mid transverse colon as well as small amount of mesenteric fat. Additional small bilobed left parasagittal ventral hernia containing mesenteric fat as well as a tiny  umbilical hernia containing mesenteric fat. Small fat containing right inguinal hernia. 5. Infiltrative changes within the right inguinal region as well as shotty right inguinal adenopathy which may be posttraumatic or inflammatory in nature. Correlation with clinical examination is recommended. 6. Extensive atherosclerotic calcification within the right common femoral artery resulting in probable near occlusion of the right common femoral  artery. If indicated, this would be better assessed with CT arteriography. Aortic Atherosclerosis (ICD10-I70.0). Electronically Signed   By: Fidela Salisbury M.D.   On: 04/09/2022 21:46   DG Chest Port 1 View  Result Date: 04/09/2022 CLINICAL DATA:  Short of breath, fell EXAM: PORTABLE CHEST 1 VIEW COMPARISON:  02/18/2021 FINDINGS: Single frontal view of the chest demonstrates stable dual lead cardiac pacer overlying left chest, and nerve stimulator overlying right chest. Cardiac silhouette is unremarkable. There is increased central vascular congestion without airspace disease, effusion, or pneumothorax. No acute bony abnormality. IMPRESSION: 1. Central vascular congestion.  No acute airspace disease. Electronically Signed   By: Randa Ngo M.D.   On: 04/09/2022 17:50    Scheduled Meds:  aspirin EC  81 mg Oral Daily   atorvastatin  40 mg Oral Daily   divalproex  1,000 mg Oral QHS   dorzolamide  1 drop Right Eye BID   enoxaparin (LOVENOX) injection  40 mg Subcutaneous Q24H   gabapentin  300 mg Oral BID   latanoprost  1 drop Right Eye BID   melatonin  10 mg Oral QHS   mometasone-formoterol  2 puff Inhalation BID   pantoprazole  40 mg Oral BID   QUEtiapine  300 mg Oral QHS   tamsulosin  0.4 mg Oral QHS   timolol  1 drop Right Eye BID    Continuous Infusions:  lactated ringers 75 mL/hr at 04/10/22 1246   penicillin G potassium 12 Million Units in dextrose 5 % 500 mL continuous infusion 12 Million Units (04/10/22 1350)     LOS: 0 days     Alma Friendly, MD [facility]  If 7PM-7AM, please contact night-coverage [email or web address] 04/10/2022, 2:33 PM

## 2022-04-10 NOTE — Progress Notes (Signed)
PHARMACY - PHYSICIAN COMMUNICATION CRITICAL VALUE ALERT - BLOOD CULTURE IDENTIFICATION (BCID)  Douglas Perkins. is an 76 y.o. male who presented to [facility] on 04/09/2022 with a chief complaint of SOB/fall  Assessment:  53 YOM found down after fall now found to have 4/4 bottles growing GPC in chains with BCID identifying Strep agalactiae (GBS). Noted ICD, nerve stimulator, radiation to RLE.  Name of physician (or Provider) Contacted: Horris Latino + ID Lucianne Lei Dam)  Current antibiotics: Vancomycin + Cefepime + Flagyl  Changes to prescribed antibiotics recommended:  Penicillin 24 million units as a continuous infusion daily (ordered as 12 MU CI q12h)  Results for orders placed or performed during the hospital encounter of 04/09/22  Blood Culture ID Panel (Reflexed) (Collected: 04/09/2022  5:24 PM)  Result Value Ref Range   Enterococcus faecalis NOT DETECTED NOT DETECTED   Enterococcus Faecium NOT DETECTED NOT DETECTED   Listeria monocytogenes NOT DETECTED NOT DETECTED   Staphylococcus species NOT DETECTED NOT DETECTED   Staphylococcus aureus (BCID) NOT DETECTED NOT DETECTED   Staphylococcus epidermidis NOT DETECTED NOT DETECTED   Staphylococcus lugdunensis NOT DETECTED NOT DETECTED   Streptococcus species DETECTED (A) NOT DETECTED   Streptococcus agalactiae DETECTED (A) NOT DETECTED   Streptococcus pneumoniae NOT DETECTED NOT DETECTED   Streptococcus pyogenes NOT DETECTED NOT DETECTED   A.calcoaceticus-baumannii NOT DETECTED NOT DETECTED   Bacteroides fragilis NOT DETECTED NOT DETECTED   Enterobacterales NOT DETECTED NOT DETECTED   Enterobacter cloacae complex NOT DETECTED NOT DETECTED   Escherichia coli NOT DETECTED NOT DETECTED   Klebsiella aerogenes NOT DETECTED NOT DETECTED   Klebsiella oxytoca NOT DETECTED NOT DETECTED   Klebsiella pneumoniae NOT DETECTED NOT DETECTED   Proteus species NOT DETECTED NOT DETECTED   Salmonella species NOT DETECTED NOT DETECTED   Serratia  marcescens NOT DETECTED NOT DETECTED   Haemophilus influenzae NOT DETECTED NOT DETECTED   Neisseria meningitidis NOT DETECTED NOT DETECTED   Pseudomonas aeruginosa NOT DETECTED NOT DETECTED   Stenotrophomonas maltophilia NOT DETECTED NOT DETECTED   Candida albicans NOT DETECTED NOT DETECTED   Candida auris NOT DETECTED NOT DETECTED   Candida glabrata NOT DETECTED NOT DETECTED   Candida krusei NOT DETECTED NOT DETECTED   Candida parapsilosis NOT DETECTED NOT DETECTED   Candida tropicalis NOT DETECTED NOT DETECTED   Cryptococcus neoformans/gattii NOT DETECTED NOT DETECTED    Thank you for allowing pharmacy to be a part of this patient's care.  Alycia Rossetti, PharmD, BCPS Infectious Diseases Clinical Pharmacist 04/10/2022 10:23 AM   **Pharmacist phone directory can now be found on [email or web address] ([email or web address]).  Listed under [place].

## 2022-04-10 NOTE — ED Notes (Signed)
MD notified of inability to collect repeat blood cultures.

## 2022-04-11 ENCOUNTER — Inpatient Hospital Stay (HOSPITAL_COMMUNITY): Payer: Medicare Other

## 2022-04-11 ENCOUNTER — Encounter (HOSPITAL_COMMUNITY): Payer: Self-pay | Admitting: Internal Medicine

## 2022-04-11 DIAGNOSIS — R8271 Bacteriuria: Secondary | ICD-10-CM | POA: Diagnosis not present

## 2022-04-11 DIAGNOSIS — A401 Sepsis due to streptococcus, group B: Secondary | ICD-10-CM

## 2022-04-11 DIAGNOSIS — R7881 Bacteremia: Secondary | ICD-10-CM | POA: Diagnosis not present

## 2022-04-11 DIAGNOSIS — I10 Essential (primary) hypertension: Secondary | ICD-10-CM | POA: Diagnosis not present

## 2022-04-11 DIAGNOSIS — C4A9 Merkel cell carcinoma, unspecified: Secondary | ICD-10-CM | POA: Diagnosis not present

## 2022-04-11 DIAGNOSIS — T827XXD Infection and inflammatory reaction due to other cardiac and vascular devices, implants and grafts, subsequent encounter: Secondary | ICD-10-CM

## 2022-04-11 DIAGNOSIS — A419 Sepsis, unspecified organism: Secondary | ICD-10-CM | POA: Diagnosis not present

## 2022-04-11 LAB — ECHOCARDIOGRAM COMPLETE
Area-P 1/2: 4.76 cm2
Calc EF: 58.2 %
Height: 72 in
S' Lateral: 3.7 cm
Single Plane A2C EF: 59.1 %
Single Plane A4C EF: 57.2 %
Weight: 3439.18 oz

## 2022-04-11 LAB — CBC WITH DIFFERENTIAL/PLATELET
Abs Immature Granulocytes: 0.1 10*3/uL — ABNORMAL HIGH (ref 0.00–0.07)
Basophils Absolute: 0.1 10*3/uL (ref 0.0–0.1)
Basophils Relative: 0 %
Eosinophils Absolute: 0.1 10*3/uL (ref 0.0–0.5)
Eosinophils Relative: 0 %
HCT: 33.3 % — ABNORMAL LOW (ref 39.0–52.0)
Hemoglobin: 11.2 g/dL — ABNORMAL LOW (ref 13.0–17.0)
Immature Granulocytes: 1 %
Lymphocytes Relative: 8 %
Lymphs Abs: 1.8 10*3/uL (ref 0.7–4.0)
MCH: 32.1 pg (ref 26.0–34.0)
MCHC: 33.6 g/dL (ref 30.0–36.0)
MCV: 95.4 fL (ref 80.0–100.0)
Monocytes Absolute: 1.9 10*3/uL — ABNORMAL HIGH (ref 0.1–1.0)
Monocytes Relative: 9 %
Neutro Abs: 17.2 10*3/uL — ABNORMAL HIGH (ref 1.7–7.7)
Neutrophils Relative %: 82 %
Platelets: 202 10*3/uL (ref 150–400)
RBC: 3.49 MIL/uL — ABNORMAL LOW (ref 4.22–5.81)
RDW: 13.1 % (ref 11.5–15.5)
WBC: 21.1 10*3/uL — ABNORMAL HIGH (ref 4.0–10.5)
nRBC: 0 % (ref 0.0–0.2)

## 2022-04-11 LAB — BASIC METABOLIC PANEL
Anion gap: 11 (ref 5–15)
BUN: 13 mg/dL (ref 8–23)
CO2: 25 mmol/L (ref 22–32)
Calcium: 8 mg/dL — ABNORMAL LOW (ref 8.9–10.3)
Chloride: 99 mmol/L (ref 98–111)
Creatinine, Ser: 0.58 mg/dL — ABNORMAL LOW (ref 0.61–1.24)
GFR, Estimated: >60 mL/min (ref >60)
Glucose, Bld: 120 mg/dL — ABNORMAL HIGH (ref 70–99)
Potassium: 3.9 mmol/L (ref 3.5–5.1)
Sodium: 135 mmol/L (ref 135–145)

## 2022-04-11 LAB — LACTIC ACID, PLASMA: Lactic Acid, Venous: 0.8 mmol/L (ref 0.5–1.9)

## 2022-04-11 LAB — PROCALCITONIN: Procalcitonin: 10.86 ng/mL

## 2022-04-11 MED ORDER — PERFLUTREN LIPID MICROSPHERE
1.0000 mL | INTRAVENOUS | Status: AC | PRN
  Administered 2022-04-11: 2 mL via INTRAVENOUS

## 2022-04-11 NOTE — Consult Note (Addendum)
ELECTROPHYSIOLOGY CONSULT NOTE    Patient ID: Douglas Perkins. MRN: [id number], DOB/AGE: [birth year] 76 y.o.  Admit date: 04/09/2022 Date of Consult: 04/11/2022  Primary Physician: [name], MD Primary Cardiologist: None  Electrophysiologist: New   Referring Provider: Dr. Tommy Medal  Patient Profile: Douglas Perkins. is a 76 y.o. 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 Tommy Medal.  HPI:  Douglas Perkins. is a 76 y.o. male with medical history as above.   Pt presented to [facility] from [facility] after being found on the ground after an unwitnessed fall. Pt was nauseated and diaphoretic and vomited at least once.  Initially found to be altered with sats in the 90s and placed on NRB. On admission had leukocytosis, febrile, tachycardic, tachypneic, with lactic acid of 3.5. Started on broad-spectrum ABx with sepsis.   BCx x2 growing group B streptococcus.  UA negative CTA chest neg for PNA or PE CT AP noted area of inflammation in R groin secondary to prior radiation therapy.  TTE scheduled for today. TEE tentatively scheduled for 12/15  ABx narrowed to penicillin  EP asked to see for consideration of pacemaker extraction in the setting of Streptococcal bacteremia  Labs Potassium3.9 (12/12 0755) Creatinine, ser  0.58* (12/12 0755) PLT  202 (12/12 0755) HGB  11.2* (12/12 0755) WBC 21.1* (12/12 0755) Troponin I (High Sensitivity)54* (12/10 2010).    Currently, pt is lying with HOB elevated, but still having intermittent pursed lip breathing. On O2 via [place], not on O2 at home.  Pacemaker was initially placed for "slow heart beats". He reports he was having syncope and falls. He is very anxious about the idea of being without a pacemaker.   Past Medical History:  Diagnosis Date   Bipolar 1 disorder ([place])     Cancer ([place])    prostate   Glaucoma    Hypertension    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[place])    Mural thrombus of cardiac apex    Presence of permanent cardiac pacemaker 2017   SA node dysfunction   Sleep apnea    does not use CPAP   TIA (transient ischemic attack)      Surgical History:  Past Surgical History:  Procedure Laterality Date   CHOLECYSTECTOMY     COLONOSCOPY WITH PROPOFOL N/A 01/31/2017   Procedure: COLONOSCOPY WITH PROPOFOL;  Surgeon: Manya Silvas, MD;  Location: [facility] ENDOSCOPY;  Service: Endoscopy;  Laterality: N/A;   DRUG INDUCED ENDOSCOPY N/A 05/12/2020   Procedure: DRUG INDUCED ENDOSCOPY;  Surgeon: Melida Quitter, MD;  Location: [place];  Service: ENT;  Laterality: N/A;   IMPLANTATION OF HYPOGLOSSAL NERVE STIMULATOR N/A 07/21/2020   Procedure: IMPLANTATION OF HYPOGLOSSAL NERVE STIMULATOR;  Surgeon: Melida Quitter, MD;  Location: [place];  Service: ENT;  Laterality: N/A;   LEG SURGERY Left    distal   Pace maker placement     PROSTATE SURGERY     PROSTATECTOMY     SKIN CANCER EXCISION  2006,2007   Merkle Cell Carcinoma     (Not in a hospital admission)   Inpatient Medications:   aspirin EC  81 mg Oral Daily   atorvastatin  40 mg Oral Daily   divalproex  1,000 mg Oral QHS   dorzolamide  1  drop Right Eye BID   enoxaparin (LOVENOX) injection  40 mg Subcutaneous Q24H   gabapentin  300 mg Oral BID   latanoprost  1 drop Right Eye BID   melatonin  10 mg Oral QHS   mometasone-formoterol  2 puff Inhalation BID   pantoprazole  40 mg Oral BID   QUEtiapine  300 mg Oral QHS   tamsulosin  0.4 mg Oral QHS   timolol  1 drop Right Eye BID    Allergies:  Allergies  Allergen Reactions   Other Hives, Shortness Of Breath and Other (See Comments)    Pt states that he is allergic to unwashed blood products.   Other reaction(s): Other (See Comments) UNWASHED BLOOD PRODUCTS  Pt states that he is allergic to unwashed blood products.     Feldene  [Piroxicam] Hives   Sulfa Antibiotics Hives    Social History   Socioeconomic History   Marital status: Married    Spouse name: Not on file   Number of children: Not on file   Years of education: Not on file   Highest education level: Not on file  Occupational History   Occupation: disabled  Tobacco Use   Smoking status: Never   Smokeless tobacco: Never  Substance and Sexual Activity   Alcohol use: No    Alcohol/week: 0.0 standard drinks of alcohol   Drug use: No   Sexual activity: Not on file  Other Topics Concern   Not on file  Social History Narrative   Not on file   Social Determinants of Health   Financial Resource Strain: Not on file  Food Insecurity: Not on file  Transportation Needs: Not on file  Physical Activity: Not on file  Stress: Not on file  Social Connections: Not on file  Intimate Partner Violence: Not on file     Family History  Problem Relation Age of Onset   Stroke Father    CAD Paternal Grandmother    CAD Paternal Grandfather      Review of Systems: All other systems reviewed and are otherwise negative except as noted above.  Physical Exam: Vitals:   04/11/22 0400 04/11/22 0500 04/11/22 0638 04/11/22 0805  BP: (!) 151/76 128/63  107/66  Pulse: 91 89  (!) 121  Resp: (!) 21 (!) 25  (!) 22  Temp:   98.5 F (36.9 C)   TempSrc:   Oral   SpO2: 97% 95%  96%  Weight:      Height:        GEN- The patient is well appearing, alert and oriented x 3 today.   HEENT: normocephalic, atraumatic; sclera clear, conjunctiva pink; hearing intact; oropharynx clear; neck supple Lungs- Clear to ausculation bilaterally, normal work of breathing.  No wheezes, rales, rhonchi Heart- Regular rate and rhythm, no murmurs, rubs or gallops GI- soft, non-tender, non-distended, bowel sounds present Extremities- no clubbing, cyanosis, or edema; DP/PT/radial pulses 2+ bilaterally MS- no significant deformity or atrophy Skin- warm and dry, no rash or lesion Psych-  euthymic mood, full affect Neuro- strength and sensation are intact  Labs:   Lab Results  Component Value Date   WBC 21.1 (H) 04/11/2022   HGB 11.2 (L) 04/11/2022   HCT 33.3 (L) 04/11/2022   MCV 95.4 04/11/2022   PLT 202 04/11/2022    Recent Labs  Lab 04/10/22 0500 04/11/22 0755  NA 134* 135  K 3.8 3.9  CL 100 99  CO2 24 25  BUN 17 13  CREATININE 0.88 0.58*  CALCIUM 7.9* 8.0*  PROT 6.4*  --   BILITOT 0.5  --   ALKPHOS 52  --   ALT 26  --   AST 56*  --   GLUCOSE 150* 120*      Radiology/Studies: CT Angio Chest PE W/Cm &/Or Wo Cm  Result Date: 04/09/2022 CLINICAL DATA:  Pulmonary embolism (PE) suspected, high prob; Sepsis. EXAM: CT ANGIOGRAPHY CHEST CT ABDOMEN AND PELVIS WITH CONTRAST TECHNIQUE: Multidetector CT imaging of the chest was performed using the standard protocol during bolus administration of intravenous contrast. Multiplanar CT image reconstructions and MIPs were obtained to evaluate the vascular anatomy. Multidetector CT imaging of the abdomen and pelvis was performed using the standard protocol during bolus administration of intravenous contrast. RADIATION DOSE REDUCTION: This exam was performed according to the departmental dose-optimization program which includes automated exposure control, adjustment of the mA and/or kV according to patient size and/or use of iterative reconstruction technique. CONTRAST:  27m OMNIPAQUE IOHEXOL 350 MG/ML SOLN COMPARISON:  CT chest 06/12/2016, CT abdomen pelvis 06/23/2015 FINDINGS: CTA CHEST FINDINGS Cardiovascular: There is adequate opacification of the pulmonary arterial tree. No intraluminal filling defect identified to suggest acute pulmonary embolism through the segmental level. The central pulmonary arteries are of normal caliber. Extensive multi-vessel coronary artery calcification. Mild calcification of the aortic valve leaflets. Global cardiac size within normal limits. No pericardial effusion. Mild atherosclerotic  calcification within the thoracic aorta. No aortic aneurysm. 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 Mediastinum/Nodes: No enlarged mediastinal, hilar, or axillary lymph nodes. Thyroid gland, trachea, and esophagus demonstrate no significant findings. Lungs/Pleura: Mild bibasilar atelectasis. Lungs are otherwise clear. No pneumothorax or pleural effusion. Musculoskeletal: Neurostimulator battery pack is seen within the right anterior chest wall with its single lead extending into the right neck. Healed left rib fracture noted. No acute bone abnormality. Review of the MIP images confirms the above findings. CT ABDOMEN and PELVIS FINDINGS Hepatobiliary: Tiny cyst within the left hepatic lobe. Liver otherwise unremarkable. Gallbladder absent. No intra or extrahepatic biliary ductal dilation. Pancreas: Unremarkable Spleen: Unremarkable Adrenals/Urinary Tract: The adrenal glands are unremarkable. The kidneys are normal in size and position. Left renal cortical scarring within the interpolar region. Mild bilateral nonspecific perinephric stranding. No loculated perinephric fluid collections. No intrarenal or ureteral calculi. No hydronephrosis. The bladder is unremarkable. Stomach/Bowel: Small epigastric ventral hernia contains a single loop of unremarkable mid transverse colon as well as small amount of mesenteric fat. The stomach, small bowel, and large bowel are otherwise unremarkable. Appendix normal. No free intraperitoneal gas or fluid. Vascular/Lymphatic: Aortic atherosclerosis. No aortic aneurysm. Extensive atherosclerotic calcification results in probable near occlusion of the right common femoral artery though this is not well assessed on this non arteriographic study. No pathologic abdominal or pelvic lymph nodes. Reproductive: Status post prostatectomy. Other: In addition to above-mentioned ventral hernia containing the transverse colon, a separate  small bilobed left parasagittal ventral hernia is present containing mesenteric fat as well as a tiny umbilical hernia containing mesenteric fat. Small right fat containing inguinal hernia is present. Musculoskeletal: There is infiltrative changes within the right inguinal region as well as shotty right inguinal adenopathy which may be posttraumatic or inflammatory in nature. Healed left inferior and superior pubic rami fractures are noted. Degenerative changes are seen within the lumbar spine. No acute bone abnormality. Review of the MIP images confirms the above findings. IMPRESSION: 1. No pulmonary embolism. No acute intrathoracic pathology identified. 2. Extensive multi-vessel coronary  artery calcification. 3. Mild calcification of the aortic valve leaflets. Echocardiography may be helpful to assess the degree of valvular dysfunction. 4. Small epigastric ventral hernia containing a single loop of unremarkable mid transverse colon as well as small amount of mesenteric fat. Additional small bilobed left parasagittal ventral hernia containing mesenteric fat as well as a tiny umbilical hernia containing mesenteric fat. Small fat containing right inguinal hernia. 5. Infiltrative changes within the right inguinal region as well as shotty right inguinal adenopathy which may be posttraumatic or inflammatory in nature. Correlation with clinical examination is recommended. 6. Extensive atherosclerotic calcification within the right common femoral artery resulting in probable near occlusion of the right common femoral artery. If indicated, this would be better assessed with CT arteriography. Aortic Atherosclerosis (ICD10-I70.0). Electronically Signed   By: Fidela Salisbury M.D.   On: 04/09/2022 21:46   CT Abdomen Pelvis W Contrast  Result Date: 04/09/2022 CLINICAL DATA:  Pulmonary embolism (PE) suspected, high prob; Sepsis. EXAM: CT ANGIOGRAPHY CHEST CT ABDOMEN AND PELVIS WITH CONTRAST TECHNIQUE: Multidetector CT  imaging of the chest was performed using the standard protocol during bolus administration of intravenous contrast. Multiplanar CT image reconstructions and MIPs were obtained to evaluate the vascular anatomy. Multidetector CT imaging of the abdomen and pelvis was performed using the standard protocol during bolus administration of intravenous contrast. RADIATION DOSE REDUCTION: This exam was performed according to the departmental dose-optimization program which includes automated exposure control, adjustment of the mA and/or kV according to patient size and/or use of iterative reconstruction technique. CONTRAST:  21m OMNIPAQUE IOHEXOL 350 MG/ML SOLN COMPARISON:  CT chest 06/12/2016, CT abdomen pelvis 06/23/2015 FINDINGS: CTA CHEST FINDINGS Cardiovascular: There is adequate opacification of the pulmonary arterial tree. No intraluminal filling defect identified to suggest acute pulmonary embolism through the segmental level. The central pulmonary arteries are of normal caliber. Extensive multi-vessel coronary artery calcification. Mild calcification of the aortic valve leaflets. Global cardiac size within normal limits. No pericardial effusion. Mild atherosclerotic calcification within the thoracic aorta. No aortic aneurysm. 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 Mediastinum/Nodes: No enlarged mediastinal, hilar, or axillary lymph nodes. Thyroid gland, trachea, and esophagus demonstrate no significant findings. Lungs/Pleura: Mild bibasilar atelectasis. Lungs are otherwise clear. No pneumothorax or pleural effusion. Musculoskeletal: Neurostimulator battery pack is seen within the right anterior chest wall with its single lead extending into the right neck. Healed left rib fracture noted. No acute bone abnormality. Review of the MIP images confirms the above findings. CT ABDOMEN and PELVIS FINDINGS Hepatobiliary: Tiny cyst within the left hepatic lobe.  Liver otherwise unremarkable. Gallbladder absent. No intra or extrahepatic biliary ductal dilation. Pancreas: Unremarkable Spleen: Unremarkable Adrenals/Urinary Tract: The adrenal glands are unremarkable. The kidneys are normal in size and position. Left renal cortical scarring within the interpolar region. Mild bilateral nonspecific perinephric stranding. No loculated perinephric fluid collections. No intrarenal or ureteral calculi. No hydronephrosis. The bladder is unremarkable. Stomach/Bowel: Small epigastric ventral hernia contains a single loop of unremarkable mid transverse colon as well as small amount of mesenteric fat. The stomach, small bowel, and large bowel are otherwise unremarkable. Appendix normal. No free intraperitoneal gas or fluid. Vascular/Lymphatic: Aortic atherosclerosis. No aortic aneurysm. Extensive atherosclerotic calcification results in probable near occlusion of the right common femoral artery though this is not well assessed on this non arteriographic study. No pathologic abdominal or pelvic lymph nodes. Reproductive: Status post prostatectomy. Other: In addition to above-mentioned ventral hernia containing the transverse  colon, a separate small bilobed left parasagittal ventral hernia is present containing mesenteric fat as well as a tiny umbilical hernia containing mesenteric fat. Small right fat containing inguinal hernia is present. Musculoskeletal: There is infiltrative changes within the right inguinal region as well as shotty right inguinal adenopathy which may be posttraumatic or inflammatory in nature. Healed left inferior and superior pubic rami fractures are noted. Degenerative changes are seen within the lumbar spine. No acute bone abnormality. Review of the MIP images confirms the above findings. IMPRESSION: 1. No pulmonary embolism. No acute intrathoracic pathology identified. 2. Extensive multi-vessel coronary artery calcification. 3. Mild calcification of the aortic valve  leaflets. Echocardiography may be helpful to assess the degree of valvular dysfunction. 4. Small epigastric ventral hernia containing a single loop of unremarkable mid transverse colon as well as small amount of mesenteric fat. Additional small bilobed left parasagittal ventral hernia containing mesenteric fat as well as a tiny umbilical hernia containing mesenteric fat. Small fat containing right inguinal hernia. 5. Infiltrative changes within the right inguinal region as well as shotty right inguinal adenopathy which may be posttraumatic or inflammatory in nature. Correlation with clinical examination is recommended. 6. Extensive atherosclerotic calcification within the right common femoral artery resulting in probable near occlusion of the right common femoral artery. If indicated, this would be better assessed with CT arteriography. Aortic Atherosclerosis (ICD10-I70.0). Electronically Signed   By: Fidela Salisbury M.D.   On: 04/09/2022 21:46   DG Chest Port 1 View  Result Date: 04/09/2022 CLINICAL DATA:  Short of breath, fell EXAM: PORTABLE CHEST 1 VIEW COMPARISON:  02/18/2021 FINDINGS: Single frontal view of the chest demonstrates stable dual lead cardiac pacer overlying left chest, and nerve stimulator overlying right chest. Cardiac silhouette is unremarkable. There is increased central vascular congestion without airspace disease, effusion, or pneumothorax. No acute bony abnormality. IMPRESSION: 1. Central vascular congestion.  No acute airspace disease. Electronically Signed   By: Randa Ngo M.D.   On: 04/09/2022 17:50    EKG: 12/10 shows sinus tachycardia ~105 bpm (personally reviewed)  TELEMETRY: NSR/ST 80-110s (personally reviewed)  DEVICE HISTORY: Dual chamber MDT device implanted 06/2015 at [facility] for "Sinus pause" per notes    Assessment/Plan: 1.  Sinus pause s/p DDD MDT PPM 06/2015 at [facility] Not dependent today; only AP 17.6% at [facility] of 60. We can tentatively turn down pacing to 40 bpm, to  further assess his true need, but may not give a clear picture with borderline tachycardia in the setting of infection.  Pt is very anxious about the idea of PPM extraction.  Picture additionally complicated by R sided inspire device. Would likely need leadless for re-implantation.   2. Severe sepsis 3. Group B streptococcal bacteremia Pending TTE today, possible TEE Friday Meets indication for device extraction. Pt very anxious about the idea.   4. Markel cell carcinoma S/p radiation therapy CT showing area of inflammation in R groin  5. OSA Has R sided implantable inspire device. As is extravascular, should not need extraction unless pocket/site is actively infected  6. Acute on chronic diastolic CHF He is 7.8I + and having some orthopnea If appropriate cut back on his ongoing fluid resuscitation, up to considering a dose of IV lasix  For questions or updates, please contact [place] Please consult [email or web address] for contact info under Cardiology/STEMI.  Jacalyn Lefevre, PA-C  04/11/2022 8:48 AM

## 2022-04-11 NOTE — Progress Notes (Signed)
Subjective:  No new complaints   Antibiotics:  Anti-infectives (From admission, onward)    Start     Dose/Rate Route Frequency Ordered Stop   04/10/22 2200  vancomycin (VANCOREADY) IVPB 2000 mg/400 mL  Status:  Discontinued        2,000 mg 200 mL/hr over 120 Minutes Intravenous Every 24 hours 04/09/22 2034 04/10/22 1020   04/10/22 1400  penicillin G potassium 12 Million Units in dextrose 5 % 500 mL continuous infusion        12 Million Units 41.7 mL/hr over 12 Hours Intravenous Every 12 hours 04/10/22 1020     04/10/22 0915  metroNIDAZOLE (FLAGYL) IVPB 500 mg  Status:  Discontinued        500 mg 100 mL/hr over 60 Minutes Intravenous Every 12 hours 04/10/22 0900 04/10/22 1020   04/10/22 0600  ceFEPIme (MAXIPIME) 2 g in sodium chloride 0.9 % 100 mL IVPB  Status:  Discontinued        2 g 200 mL/hr over 30 Minutes Intravenous Every 8 hours 04/09/22 2034 04/10/22 1020   04/09/22 1900  ceFEPIme (MAXIPIME) 2 g in sodium chloride 0.9 % 100 mL IVPB        2 g 200 mL/hr over 30 Minutes Intravenous  Once 04/09/22 1848 04/09/22 2040   04/09/22 1900  metroNIDAZOLE (FLAGYL) IVPB 500 mg        500 mg 100 mL/hr over 60 Minutes Intravenous  Once 04/09/22 1848 04/09/22 2006   04/09/22 1900  vancomycin (VANCOCIN) IVPB 1000 mg/200 mL premix  Status:  Discontinued        1,000 mg 200 mL/hr over 60 Minutes Intravenous  Once 04/09/22 1848 04/09/22 1850   04/09/22 1900  vancomycin (VANCOCIN) IVPB 1000 mg/200 mL premix       See Hyperspace for full Linked Orders Report.   1,000 mg 200 mL/hr over 60 Minutes Intravenous  Once 04/09/22 1850 04/09/22 2137   04/09/22 1900  vancomycin (VANCOCIN) IVPB 1000 mg/200 mL premix       See Hyperspace for full Linked Orders Report.   1,000 mg 200 mL/hr over 60 Minutes Intravenous  Once 04/09/22 1850 04/09/22 2241       Medications: Scheduled Meds:  aspirin EC  81 mg Oral Daily   atorvastatin  40 mg Oral Daily   divalproex  1,000 mg Oral QHS    dorzolamide  1 drop Right Eye BID   enoxaparin (LOVENOX) injection  40 mg Subcutaneous Q24H   gabapentin  300 mg Oral BID   latanoprost  1 drop Right Eye BID   melatonin  10 mg Oral QHS   mometasone-formoterol  2 puff Inhalation BID   pantoprazole  40 mg Oral BID   QUEtiapine  300 mg Oral QHS   tamsulosin  0.4 mg Oral QHS   timolol  1 drop Right Eye BID   Continuous Infusions:  penicillin G potassium 12 Million Units in dextrose 5 % 500 mL continuous infusion 12 Million Units (04/11/22 0807)   PRN Meds:.acetaminophen **OR** acetaminophen, albuterol, ondansetron **OR** ondansetron (ZOFRAN) IV    Objective: Weight change:   Intake/Output Summary (Last 24 hours) at 04/11/2022 1413 Last data filed at 04/11/2022 0730 Gross per 24 hour  Intake 1458.97 ml  Output 425 ml  Net 1033.97 ml   Blood pressure 138/70, pulse 92, temperature 98.3 F (36.8 C), temperature source Oral, resp. rate (!) 21, height 6' (1.829 m), weight 97.5 kg, SpO2 96 %.  Temp:  [98.1 F (36.7 C)-98.5 F (36.9 C)] 98.3 F (36.8 C) (12/12 1134) Pulse Rate:  [78-121] 92 (12/12 1300) Resp:  [19-34] 21 (12/12 1300) BP: (107-159)/(39-91) 138/70 (12/12 1300) SpO2:  [93 %-100 %] 96 % (12/12 1300)  Physical Exam: Physical Exam Constitutional:      Appearance: He is well-developed.  HENT:     Head: Normocephalic and atraumatic.  Eyes:     Conjunctiva/sclera: Conjunctivae normal.  Cardiovascular:     Rate and Rhythm: Normal rate and regular rhythm.  Pulmonary:     Effort: Pulmonary effort is normal. No respiratory distress.     Breath sounds: No wheezing.  Abdominal:     General: There is no distension.     Palpations: Abdomen is soft.  Musculoskeletal:        General: Normal range of motion.     Cervical back: Normal range of motion and neck supple.  Skin:    General: Skin is warm and dry.     Findings: No erythema or rash.  Neurological:     General: No focal deficit present.     Mental Status: He is  alert and oriented to person, place, and time.  Psychiatric:        Mood and Affect: Mood normal.        Behavior: Behavior normal.        Thought Content: Thought content normal.        Judgment: Judgment normal.      CBC:    BMET Recent Labs    04/10/22 0500 04/11/22 0755  NA 134* 135  K 3.8 3.9  CL 100 99  CO2 24 25  GLUCOSE 150* 120*  BUN 17 13  CREATININE 0.88 0.58*  CALCIUM 7.9* 8.0*     Liver Panel  Recent Labs    04/09/22 1737 04/10/22 0500  PROT 7.3 6.4*  ALBUMIN 3.5 2.8*  AST 33 56*  ALT 26 26  ALKPHOS 66 52  BILITOT 0.4 0.5       Sedimentation Rate No results for input(s): "ESRSEDRATE" in the last 72 hours. C-Reactive Protein No results for input(s): "CRP" in the last 72 hours.  Micro Results: Recent Results (from the past 720 hour(s))  Resp panel by RT-PCR (RSV, Flu A&B, Covid) Anterior Nasal Swab     Status: None   Collection Time: 04/09/22  5:18 PM   Specimen: Anterior Nasal Swab  Result Value Ref Range Status   SARS Coronavirus 2 by RT PCR NEGATIVE NEGATIVE Final    Comment: (NOTE) SARS-CoV-2 target nucleic acids are NOT DETECTED.  The SARS-CoV-2 RNA is generally detectable in upper respiratory specimens during the acute phase of infection. The lowest concentration of SARS-CoV-2 viral copies this assay can detect is 138 copies/mL. A negative result does not preclude SARS-Cov-2 infection and should not be used as the sole basis for treatment or other patient management decisions. A negative result may occur with  improper specimen collection/handling, submission of specimen other than nasopharyngeal swab, presence of viral mutation(s) within the areas targeted by this assay, and inadequate number of viral copies(<138 copies/mL). A negative result must be combined with clinical observations, patient history, and epidemiological information. The expected result is Negative.  Fact Sheet for Patients:   EntrepreneurPulse.com.au  Fact Sheet for Healthcare Providers:  IncredibleEmployment.be  This test is no t yet approved or cleared by the [place] FDA and  has been authorized for detection and/or diagnosis of SARS-CoV-2 by FDA under an Emergency  Use Authorization (EUA). This EUA will remain  in effect (meaning this test can be used) for the duration of the COVID-19 declaration under Section 564(b)(1) of the Act, 21 U.S.C.section 360bbb-3(b)(1), unless the authorization is terminated  or revoked sooner.       Influenza A by PCR NEGATIVE NEGATIVE Final   Influenza B by PCR NEGATIVE NEGATIVE Final    Comment: (NOTE) The Xpert Xpress SARS-CoV-2/FLU/RSV plus assay is intended as an aid in the diagnosis of influenza from Nasopharyngeal swab specimens and should not be used as a sole basis for treatment. Nasal washings and aspirates are unacceptable for Xpert Xpress SARS-CoV-2/FLU/RSV testing.  Fact Sheet for Patients: EntrepreneurPulse.com.au  Fact Sheet for Healthcare Providers: IncredibleEmployment.be  This test is not yet approved or cleared by the [place] FDA and has been authorized for detection and/or diagnosis of SARS-CoV-2 by FDA under an Emergency Use Authorization (EUA). This EUA will remain in effect (meaning this test can be used) for the duration of the COVID-19 declaration under Section 564(b)(1) of the Act, 21 U.S.C. section 360bbb-3(b)(1), unless the authorization is terminated or revoked.     Resp Syncytial Virus by PCR NEGATIVE NEGATIVE Final    Comment: (NOTE) Fact Sheet for Patients: EntrepreneurPulse.com.au  Fact Sheet for Healthcare Providers: IncredibleEmployment.be  This test is not yet approved or cleared by the [place] FDA and has been authorized for detection and/or diagnosis of SARS-CoV-2 by FDA under an Emergency Use  Authorization (EUA). This EUA will remain in effect (meaning this test can be used) for the duration of the COVID-19 declaration under Section 564(b)(1) of the Act, 21 U.S.C. section 360bbb-3(b)(1), unless the authorization is terminated or revoked.  Performed at [facility], [place] [address], [place], [place] [address]   Culture, blood (routine x 2)     Status: Abnormal (Preliminary result)   Collection Time: 04/09/22  5:24 PM   Specimen: BLOOD RIGHT HAND  Result Value Ref Range Status   Specimen Description BLOOD RIGHT HAND  Final   Special Requests   Final    BOTTLES DRAWN AEROBIC AND ANAEROBIC Blood Culture adequate volume   Culture  Setup Time   Final    GRAM POSITIVE COCCI IN CHAINS IN BOTH AEROBIC AND ANAEROBIC BOTTLES CRITICAL RESULT CALLED TO, READ BACK BY AND VERIFIED WITH: PHARMD ELIZABETH M 1011 [id number] FCP    Culture (A)  Final    GROUP B STREP(S.AGALACTIAE)ISOLATED SUSCEPTIBILITIES TO FOLLOW Performed at [facility], [place] [address] [place], [place] [address]    Report Status PENDING  Incomplete  Blood Culture ID Panel (Reflexed)     Status: Abnormal   Collection Time: 04/09/22  5:24 PM  Result Value Ref Range Status   Enterococcus faecalis NOT DETECTED NOT DETECTED Final   Enterococcus Faecium NOT DETECTED NOT DETECTED Final   Listeria monocytogenes NOT DETECTED NOT DETECTED Final   Staphylococcus species NOT DETECTED NOT DETECTED Final   Staphylococcus aureus (BCID) NOT DETECTED NOT DETECTED Final   Staphylococcus epidermidis NOT DETECTED NOT DETECTED Final   Staphylococcus lugdunensis NOT DETECTED NOT DETECTED Final   Streptococcus species DETECTED (A) NOT DETECTED Final    Comment: CRITICAL RESULT CALLED TO, READ BACK BY AND VERIFIED WITH: PHARMD ELIZABETH M 1011 [id number] FCP    Streptococcus agalactiae DETECTED (A) NOT DETECTED Final    Comment: CRITICAL RESULT CALLED TO, READ BACK BY AND VERIFIED WITH: PHARMD ELIZABETH M 1011 [id number] FCP     Streptococcus pneumoniae NOT DETECTED NOT  DETECTED Final   Streptococcus pyogenes NOT DETECTED NOT DETECTED Final   A.calcoaceticus-baumannii NOT DETECTED NOT DETECTED Final   Bacteroides fragilis NOT DETECTED NOT DETECTED Final   Enterobacterales NOT DETECTED NOT DETECTED Final   Enterobacter cloacae complex NOT DETECTED NOT DETECTED Final   Escherichia coli NOT DETECTED NOT DETECTED Final   Klebsiella aerogenes NOT DETECTED NOT DETECTED Final   Klebsiella oxytoca NOT DETECTED NOT DETECTED Final   Klebsiella pneumoniae NOT DETECTED NOT DETECTED Final   Proteus species NOT DETECTED NOT DETECTED Final   Salmonella species NOT DETECTED NOT DETECTED Final   Serratia marcescens NOT DETECTED NOT DETECTED Final   Haemophilus influenzae NOT DETECTED NOT DETECTED Final   Neisseria meningitidis NOT DETECTED NOT DETECTED Final   Pseudomonas aeruginosa NOT DETECTED NOT DETECTED Final   Stenotrophomonas maltophilia NOT DETECTED NOT DETECTED Final   Candida albicans NOT DETECTED NOT DETECTED Final   Candida auris NOT DETECTED NOT DETECTED Final   Candida glabrata NOT DETECTED NOT DETECTED Final   Candida krusei NOT DETECTED NOT DETECTED Final   Candida parapsilosis NOT DETECTED NOT DETECTED Final   Candida tropicalis NOT DETECTED NOT DETECTED Final   Cryptococcus neoformans/gattii NOT DETECTED NOT DETECTED Final    Comment: Performed at [facility], [address], [place], [place] [address]  Culture, blood (routine x 2)     Status: Abnormal (Preliminary result)   Collection Time: 04/09/22  5:37 PM   Specimen: BLOOD LEFT HAND  Result Value Ref Range Status   Specimen Description BLOOD LEFT HAND  Final   Special Requests   Final    BOTTLES DRAWN AEROBIC AND ANAEROBIC Blood Culture adequate volume   Culture  Setup Time   Final    GRAM POSITIVE COCCI IN CHAINS IN BOTH AEROBIC AND ANAEROBIC BOTTLES CRITICAL VALUE NOTED.  VALUE IS CONSISTENT WITH PREVIOUSLY REPORTED AND CALLED  VALUE. Performed at [facility], [place] [address], [place], [place] [address]    Culture GROUP B STREP(S.AGALACTIAE)ISOLATED (A)  Final   Report Status PENDING  Incomplete  MRSA Next Gen by PCR, Nasal     Status: None   Collection Time: 04/10/22  6:25 AM   Specimen: Nasal Mucosa; Nasal Swab  Result Value Ref Range Status   MRSA by PCR Next Gen NOT DETECTED NOT DETECTED Final    Comment: (NOTE) The GeneXpert MRSA Assay (FDA approved for NASAL specimens only), is one component of a comprehensive MRSA colonization surveillance program. It is not intended to diagnose MRSA infection nor to guide or monitor treatment for MRSA infections. Test performance is not FDA approved in patients less than 10 years old. Performed at [facility], [place] [address], [place], [place] [address]     Studies/Results: ECHOCARDIOGRAM COMPLETE  Result Date: 04/11/2022    ECHOCARDIOGRAM REPORT   Patient Name:   Douglas Perkins. Date of Exam: 04/11/2022 Medical Rec #:  [id number]           Height:       72.0 in Accession #:    [id number]          Weight:       214.9 lb Date of Birth:  1945/10/30           BSA:          2.197 m Patient Age:    29 years            BP:  128/63 mmHg Patient Gender: M                   HR:           76 bpm. Exam Location:  Inpatient Procedure: 2D Echo, Cardiac Doppler, Color Doppler and Intracardiac            Opacification Agent Indications:    Bacteremia  History:        Patient has prior history of Echocardiogram examinations, most                 recent 05/22/2017. CAD, Abnormal ECG and Pacemaker,                 Arrythmias:Bradycardia, Signs/Symptoms:Chest Pain, Bacteremia,                 Syncope and Dyspnea; Risk Factors:Hypertension, Sleep Apnea and                 Dyslipidemia. Cancer.  Sonographer:    Roseanna Rainbow RDCS Referring Phys: [id number] VAN DAM  Sonographer Comments: Technically difficult study due to poor echo windows, suboptimal parasternal window,  suboptimal apical window, suboptimal subcostal window and patient is obese. Image acquisition challenging due to patient body habitus. Difficult study, patient moving and uncomfortable. IMPRESSIONS  1. The aortic valve is abnormal; trileaflet aortic valve with mild calcification. In the PLAX, cannot exclude echodensity on the ventricular surfance of the aortic valve. No AI in this region. There is mild calcification of the aortic valve. There is mild thickening of the aortic valve. Aortic valve regurgitation is not visualized.  2. Left ventricular ejection fraction, by estimation, is 60 to 65%. The left ventricle has normal function. The left ventricle has no regional wall motion abnormalities. There is moderate concentric left ventricular hypertrophy. Left ventricular diastolic parameters are indeterminate.  3. Right ventricular systolic function is hyperdynamic. The right ventricular size is normal. Tricuspid regurgitation signal is inadequate for assessing PA pressure.  4. The mitral valve is normal in structure. No evidence of mitral valve regurgitation. No evidence of mitral stenosis.  5. The inferior vena cava is normal in size with greater than 50% respiratory variability, suggesting right atrial pressure of 3 mmHg. Comparison(s): No prior Echocardiogram. Conclusion(s)/Recommendation(s): In the setting of bacteremia, consider TEE. FINDINGS  Left Ventricle: Left ventricular ejection fraction, by estimation, is 60 to 65%. The left ventricle has normal function. The left ventricle has no regional wall motion abnormalities. Definity contrast agent was given IV to delineate the left ventricular  endocardial borders. The left ventricular internal cavity size was normal in size. There is moderate concentric left ventricular hypertrophy. Left ventricular diastolic parameters are indeterminate. Right Ventricle: The right ventricular size is normal. No increase in right ventricular wall thickness. Right ventricular  systolic function is hyperdynamic. Tricuspid regurgitation signal is inadequate for assessing PA pressure. Left Atrium: Left atrial size was normal in size. Right Atrium: Right atrial size was normal in size. Pericardium: There is no evidence of pericardial effusion. Mitral Valve: The mitral valve is normal in structure. No evidence of mitral valve regurgitation. No evidence of mitral valve stenosis. Tricuspid Valve: The tricuspid valve is normal in structure. Tricuspid valve regurgitation is not demonstrated. No evidence of tricuspid stenosis. Aortic Valve: The aortic valve is abnormal. There is mild calcification of the aortic valve. There is mild thickening of the aortic valve. Aortic valve regurgitation is not visualized. Pulmonic Valve: The pulmonic valve was not well visualized. Pulmonic valve  regurgitation is trivial. Aorta: The aortic root and ascending aorta are structurally normal, with no evidence of dilitation. Venous: The inferior vena cava is normal in size with greater than 50% respiratory variability, suggesting right atrial pressure of 3 mmHg. IAS/Shunts: No atrial level shunt detected by color flow Doppler.  LEFT VENTRICLE PLAX 2D LVIDd:         5.40 cm LVIDs:         3.70 cm LV PW:         1.30 cm LV IVS:        1.20 cm LVOT diam:     2.50 cm LV SV:         67 LV SV Index:   31 LVOT Area:     4.91 cm  LV Volumes (MOD) LV vol d, MOD A2C: 217.0 ml LV vol d, MOD A4C: 171.0 ml LV vol s, MOD A2C: 88.8 ml LV vol s, MOD A4C: 73.2 ml LV SV MOD A2C:     128.2 ml LV SV MOD A4C:     171.0 ml LV SV MOD BP:      114.3 ml RIGHT VENTRICLE             IVC RV S prime:     12.60 cm/s  IVC diam: 1.90 cm TAPSE (M-mode): 2.8 cm LEFT ATRIUM           Index        RIGHT ATRIUM           Index LA diam:      3.70 cm 1.68 cm/m   RA Area:     16.00 cm LA Vol (A2C): 37.8 ml 17.21 ml/m  RA Volume:   40.10 ml  18.25 ml/m LA Vol (A4C): 31.2 ml 14.20 ml/m  AORTIC VALVE LVOT Vmax:   100.53 cm/s LVOT Vmean:  66.033 cm/s LVOT  VTI:    0.137 m  AORTA Ao Root diam: 3.60 cm Ao Asc diam:  3.20 cm MITRAL VALVE MV Area (PHT): 4.76 cm     SHUNTS MV Decel Time: 159 msec     Systemic VTI:  0.14 m MV E velocity: 119.33 cm/s  Systemic Diam: 2.50 cm Rudean Haskell MD Electronically signed by Rudean Haskell MD Signature Date/Time: 04/11/2022/10:48:29 AM    Final    CT Angio Chest PE W/Cm &/Or Wo Cm  Result Date: 04/09/2022 CLINICAL DATA:  Pulmonary embolism (PE) suspected, high prob; Sepsis. EXAM: CT ANGIOGRAPHY CHEST CT ABDOMEN AND PELVIS WITH CONTRAST TECHNIQUE: Multidetector CT imaging of the chest was performed using the standard protocol during bolus administration of intravenous contrast. Multiplanar CT image reconstructions and MIPs were obtained to evaluate the vascular anatomy. Multidetector CT imaging of the abdomen and pelvis was performed using the standard protocol during bolus administration of intravenous contrast. RADIATION DOSE REDUCTION: This exam was performed according to the departmental dose-optimization program which includes automated exposure control, adjustment of the mA and/or kV according to patient size and/or use of iterative reconstruction technique. CONTRAST:  78m OMNIPAQUE IOHEXOL 350 MG/ML SOLN COMPARISON:  CT chest 06/12/2016, CT abdomen pelvis 06/23/2015 FINDINGS: CTA CHEST FINDINGS Cardiovascular: There is adequate opacification of the pulmonary arterial tree. No intraluminal filling defect identified to suggest acute pulmonary embolism through the segmental level. The central pulmonary arteries are of normal caliber. Extensive multi-vessel coronary artery calcification. Mild calcification of the aortic valve leaflets. Global cardiac size within normal limits. No pericardial effusion. Mild atherosclerotic calcification within the thoracic aorta. No aortic aneurysm.  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 Mediastinum/Nodes: No enlarged  mediastinal, hilar, or axillary lymph nodes. Thyroid gland, trachea, and esophagus demonstrate no significant findings. Lungs/Pleura: Mild bibasilar atelectasis. Lungs are otherwise clear. No pneumothorax or pleural effusion. Musculoskeletal: Neurostimulator battery pack is seen within the right anterior chest wall with its single lead extending into the right neck. Healed left rib fracture noted. No acute bone abnormality. Review of the MIP images confirms the above findings. CT ABDOMEN and PELVIS FINDINGS Hepatobiliary: Tiny cyst within the left hepatic lobe. Liver otherwise unremarkable. Gallbladder absent. No intra or extrahepatic biliary ductal dilation. Pancreas: Unremarkable Spleen: Unremarkable Adrenals/Urinary Tract: The adrenal glands are unremarkable. The kidneys are normal in size and position. Left renal cortical scarring within the interpolar region. Mild bilateral nonspecific perinephric stranding. No loculated perinephric fluid collections. No intrarenal or ureteral calculi. No hydronephrosis. The bladder is unremarkable. Stomach/Bowel: Small epigastric ventral hernia contains a single loop of unremarkable mid transverse colon as well as small amount of mesenteric fat. The stomach, small bowel, and large bowel are otherwise unremarkable. Appendix normal. No free intraperitoneal gas or fluid. Vascular/Lymphatic: Aortic atherosclerosis. No aortic aneurysm. Extensive atherosclerotic calcification results in probable near occlusion of the right common femoral artery though this is not well assessed on this non arteriographic study. No pathologic abdominal or pelvic lymph nodes. Reproductive: Status post prostatectomy. Other: In addition to above-mentioned ventral hernia containing the transverse colon, a separate small bilobed left parasagittal ventral hernia is present containing mesenteric fat as well as a tiny umbilical hernia containing mesenteric fat. Small right fat containing inguinal hernia is  present. Musculoskeletal: There is infiltrative changes within the right inguinal region as well as shotty right inguinal adenopathy which may be posttraumatic or inflammatory in nature. Healed left inferior and superior pubic rami fractures are noted. Degenerative changes are seen within the lumbar spine. No acute bone abnormality. Review of the MIP images confirms the above findings. IMPRESSION: 1. No pulmonary embolism. No acute intrathoracic pathology identified. 2. Extensive multi-vessel coronary artery calcification. 3. Mild calcification of the aortic valve leaflets. Echocardiography may be helpful to assess the degree of valvular dysfunction. 4. Small epigastric ventral hernia containing a single loop of unremarkable mid transverse colon as well as small amount of mesenteric fat. Additional small bilobed left parasagittal ventral hernia containing mesenteric fat as well as a tiny umbilical hernia containing mesenteric fat. Small fat containing right inguinal hernia. 5. Infiltrative changes within the right inguinal region as well as shotty right inguinal adenopathy which may be posttraumatic or inflammatory in nature. Correlation with clinical examination is recommended. 6. Extensive atherosclerotic calcification within the right common femoral artery resulting in probable near occlusion of the right common femoral artery. If indicated, this would be better assessed with CT arteriography. Aortic Atherosclerosis (ICD10-I70.0). Electronically Signed   By: Fidela Salisbury M.D.   On: 04/09/2022 21:46   CT Abdomen Pelvis W Contrast  Result Date: 04/09/2022 CLINICAL DATA:  Pulmonary embolism (PE) suspected, high prob; Sepsis. EXAM: CT ANGIOGRAPHY CHEST CT ABDOMEN AND PELVIS WITH CONTRAST TECHNIQUE: Multidetector CT imaging of the chest was performed using the standard protocol during bolus administration of intravenous contrast. Multiplanar CT image reconstructions and MIPs were obtained to evaluate the  vascular anatomy. Multidetector CT imaging of the abdomen and pelvis was performed using the standard protocol during bolus administration of intravenous contrast. RADIATION DOSE REDUCTION: This exam was performed according to the departmental dose-optimization program which includes  automated exposure control, adjustment of the mA and/or kV according to patient size and/or use of iterative reconstruction technique. CONTRAST:  84m OMNIPAQUE IOHEXOL 350 MG/ML SOLN COMPARISON:  CT chest 06/12/2016, CT abdomen pelvis 06/23/2015 FINDINGS: CTA CHEST FINDINGS Cardiovascular: There is adequate opacification of the pulmonary arterial tree. No intraluminal filling defect identified to suggest acute pulmonary embolism through the segmental level. The central pulmonary arteries are of normal caliber. Extensive multi-vessel coronary artery calcification. Mild calcification of the aortic valve leaflets. Global cardiac size within normal limits. No pericardial effusion. Mild atherosclerotic calcification within the thoracic aorta. No aortic aneurysm. 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 Mediastinum/Nodes: No enlarged mediastinal, hilar, or axillary lymph nodes. Thyroid gland, trachea, and esophagus demonstrate no significant findings. Lungs/Pleura: Mild bibasilar atelectasis. Lungs are otherwise clear. No pneumothorax or pleural effusion. Musculoskeletal: Neurostimulator battery pack is seen within the right anterior chest wall with its single lead extending into the right neck. Healed left rib fracture noted. No acute bone abnormality. Review of the MIP images confirms the above findings. CT ABDOMEN and PELVIS FINDINGS Hepatobiliary: Tiny cyst within the left hepatic lobe. Liver otherwise unremarkable. Gallbladder absent. No intra or extrahepatic biliary ductal dilation. Pancreas: Unremarkable Spleen: Unremarkable Adrenals/Urinary Tract: The adrenal glands are  unremarkable. The kidneys are normal in size and position. Left renal cortical scarring within the interpolar region. Mild bilateral nonspecific perinephric stranding. No loculated perinephric fluid collections. No intrarenal or ureteral calculi. No hydronephrosis. The bladder is unremarkable. Stomach/Bowel: Small epigastric ventral hernia contains a single loop of unremarkable mid transverse colon as well as small amount of mesenteric fat. The stomach, small bowel, and large bowel are otherwise unremarkable. Appendix normal. No free intraperitoneal gas or fluid. Vascular/Lymphatic: Aortic atherosclerosis. No aortic aneurysm. Extensive atherosclerotic calcification results in probable near occlusion of the right common femoral artery though this is not well assessed on this non arteriographic study. No pathologic abdominal or pelvic lymph nodes. Reproductive: Status post prostatectomy. Other: In addition to above-mentioned ventral hernia containing the transverse colon, a separate small bilobed left parasagittal ventral hernia is present containing mesenteric fat as well as a tiny umbilical hernia containing mesenteric fat. Small right fat containing inguinal hernia is present. Musculoskeletal: There is infiltrative changes within the right inguinal region as well as shotty right inguinal adenopathy which may be posttraumatic or inflammatory in nature. Healed left inferior and superior pubic rami fractures are noted. Degenerative changes are seen within the lumbar spine. No acute bone abnormality. Review of the MIP images confirms the above findings. IMPRESSION: 1. No pulmonary embolism. No acute intrathoracic pathology identified. 2. Extensive multi-vessel coronary artery calcification. 3. Mild calcification of the aortic valve leaflets. Echocardiography may be helpful to assess the degree of valvular dysfunction. 4. Small epigastric ventral hernia containing a single loop of unremarkable mid transverse colon as  well as small amount of mesenteric fat. Additional small bilobed left parasagittal ventral hernia containing mesenteric fat as well as a tiny umbilical hernia containing mesenteric fat. Small fat containing right inguinal hernia. 5. Infiltrative changes within the right inguinal region as well as shotty right inguinal adenopathy which may be posttraumatic or inflammatory in nature. Correlation with clinical examination is recommended. 6. Extensive atherosclerotic calcification within the right common femoral artery resulting in probable near occlusion of the right common femoral artery. If indicated, this would be better assessed with CT arteriography. Aortic Atherosclerosis (ICD10-I70.0). Electronically Signed   By: ALinwood DibblesD.  On: 04/09/2022 21:46   DG Chest Port 1 View  Result Date: 04/09/2022 CLINICAL DATA:  Short of breath, fell EXAM: PORTABLE CHEST 1 VIEW COMPARISON:  02/18/2021 FINDINGS: Single frontal view of the chest demonstrates stable dual lead cardiac pacer overlying left chest, and nerve stimulator overlying right chest. Cardiac silhouette is unremarkable. There is increased central vascular congestion without airspace disease, effusion, or pneumothorax. No acute bony abnormality. IMPRESSION: 1. Central vascular congestion.  No acute airspace disease. Electronically Signed   By: Randa Ngo M.D.   On: 04/09/2022 17:50      Assessment/Plan:  INTERVAL HISTORY:   One Peripheral blood culture appears to been taken yesterday when he was a difficult stick   Principal Problem:   Group B streptococcal bacteriuria Active Problems:   Benign essential hypertension   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HCC)   SIRS (systemic inflammatory response syndrome) (HCC)   Sepsis (HCC)    Douglas Perkins. is a 77 y.o. 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  TTE not conclusively negative and pt needs TEE which is scheduled  Regardless of TEE finding of vegetations with G+ bacteremia from ID standpoint would clearly favor device extraction  We will continue high dose PCN  I spent 52 minutes with the patient including than 50% of the time in face to face counseling of the patient and his group B streptococcus bacteremia with concern for device infection, chronic erythema of the lower extremities and radiation changes, personally reviewing TTE along with review of medical records in preparation for the visit and during the visit and in coordination of his care.    LOS: 1 day   Alcide Evener 04/11/2022, 2:13 PM

## 2022-04-11 NOTE — Progress Notes (Signed)
PROGRESS NOTE  Douglas Perkins. XQJ:[id number] DOB: [birth year] DOA: 04/09/2022 PCP: [name], MD  HPI/Recap of past 24 hours: Douglas Perkins. is a 76 y.o. 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 Pt presents to ED from [facility].  Was found on the ground after an unwitnessed fall. Was nauseous and diaphoretic and vomited once. Found to be altered initially and sats were around 90% so placed on nonrebreather. In the ED, noted to be septic, started on broad-spectrum antibiotics. Patient admitted for further management.    Today, patient still appears somewhat lethargic, but overall slowly improving.    Assessment/Plan: Principal Problem:   Group B streptococcal bacteriuria Active Problems:   SIRS (systemic inflammatory response syndrome) (HCC)   Benign essential hypertension   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HCC)   Sepsis (HCC)   Severe sepsis likely 2/2 Group B streptococcal bacteremia On admission leukocytosis, febrile, tachycardic, tachypneic, lactic acid of 3.5 Currently afebrile, with resolving leukocytosis, lactic acid resolved Procalcitonin elevated, trending down BC X 2 growing group B streptococcus, repeat pending UA negative CTA chest neg for PNA or PE CT A/p: Noted area of inflammation in right groin secondary to prior radiation therapy ID on board, appreciate recs TTE showed EF of 60 to 65%, no regional wall motion abnormality, recommend TEE scheduled for 12/15 (pacemaker) Cardiology and EP consulted, rec lead extraction, but patient refused ID narrowed broad-spectrum antibiotics to penicillin S/P IV fluids, BP stable Monitor closely  Hypertension BP noted to be soft/fluctuating likely 2/2 sepsis Hold home Coreg, amlodipine pending BP stability  CAD/HLD Mildly elevated troponin-likely demand ischemia Echo noted as above Denies any chest pain Continue aspirin, Lipitor  Merkel cell  carcinoma S/p radiation therapy CT showing area of inflammation in right groin RLE swollen with erythema, will order doppler  Obstructive sleep apnea Does have implantable inspire device  Obesity Lifestyle modification advised    Estimated body mass index is 38.09 kg/m as calculated from the following:   Height as of this encounter: 6' (1.829 m).   Weight as of this encounter: 127.4 kg.     Code Status: Full  Family Communication: None at bedside  Disposition Plan: Status is: Inpatient The patient will require care spanning > 2 midnights and should be moved to inpatient because: Level of care      Consultants: ID EP  Procedures: None  Antimicrobials: Penicillin G  DVT prophylaxis: Lovenox   Objective: Vitals:   04/11/22 1134 04/11/22 1230 04/11/22 1300 04/11/22 1515  BP: (!) 126/55 (!) 134/91 138/70 (!) 160/76  Pulse: 83 84 92 83  Resp: (!) 23 (!) 23 (!) 21 16  Temp: 98.3 F (36.8 C)   98.1 F (36.7 C)  TempSrc: Oral   Oral  SpO2: 96% 96% 96% 97%  Weight:    127.4 kg  Height:    6' (1.829 m)    Intake/Output Summary (Last 24 hours) at 04/11/2022 1717 Last data filed at 04/11/2022 0730 Gross per 24 hour  Intake 1458.97 ml  Output 425 ml  Net 1033.97 ml   Filed Weights   04/09/22 1727 04/11/22 1515  Weight: 97.5 kg 127.4 kg    Exam: General: NAD, sleepy Cardiovascular: S1, S2 present Respiratory: CTAB Abdomen: Soft, nontender, nondistended, bowel sounds present Musculoskeletal: +1 RLE edema, with noted erythema of right calf, LLE with trace edema Skin: Noted as above Psychiatry: Normal mood    Data Reviewed: CBC:  Recent Labs  Lab 04/09/22 1737 04/09/22 1748 04/10/22 0500 04/11/22 0755  WBC 20.4*  --  31.7* 21.1*  NEUTROABS 17.5*  --   --  17.2*  HGB 13.6 13.6 12.0* 11.2*  HCT 40.3 40.0 35.0* 33.3*  MCV 92.9  --  93.3 95.4  PLT 284  --  226 213   Basic Metabolic Panel: Recent Labs  Lab 04/09/22 1737 04/09/22 1748  04/10/22 0500 04/11/22 0755  NA 134* 135 134* 135  K 3.6 3.7 3.8 3.9  CL 100 101 100 99  CO2 20*  --  24 25  GLUCOSE 120* 125* 150* 120*  BUN '23 23 17 13  '$ CREATININE 1.24 1.00 0.88 0.58*  CALCIUM 8.9  --  7.9* 8.0*   GFR: Estimated Creatinine Clearance: 104.9 mL/min (A) (by C-G formula based on SCr of 0.58 mg/dL (L)). Liver Function Tests: Recent Labs  Lab 04/09/22 1737 04/10/22 0500  AST 33 56*  ALT 26 26  ALKPHOS 66 52  BILITOT 0.4 0.5  PROT 7.3 6.4*  ALBUMIN 3.5 2.8*   Recent Labs  Lab 04/09/22 1737  LIPASE 39   No results for input(s): "AMMONIA" in the last 168 hours. Coagulation Profile: Recent Labs  Lab 04/09/22 1737 04/10/22 0500  INR 1.1 1.2   Cardiac Enzymes: Recent Labs  Lab 04/09/22 2010  CKTOTAL 847*   BNP (last 3 results) No results for input(s): "PROBNP" in the last 8760 hours. HbA1C: No results for input(s): "HGBA1C" in the last 72 hours. CBG: No results for input(s): "GLUCAP" in the last 168 hours. Lipid Profile: No results for input(s): "CHOL", "HDL", "LDLCALC", "TRIG", "CHOLHDL", "LDLDIRECT" in the last 72 hours. Thyroid Function Tests: No results for input(s): "TSH", "T4TOTAL", "FREET4", "T3FREE", "THYROIDAB" in the last 72 hours. Anemia Panel: No results for input(s): "VITAMINB12", "FOLATE", "FERRITIN", "TIBC", "IRON", "RETICCTPCT" in the last 72 hours. Urine analysis:    Component Value Date/Time   COLORURINE YELLOW 04/09/2022 2135   APPEARANCEUR HAZY (A) 04/09/2022 2135   APPEARANCEUR Clear 05/24/2015 1328   LABSPEC 1.017 04/09/2022 2135   PHURINE 6.0 04/09/2022 2135   GLUCOSEU NEGATIVE 04/09/2022 2135   HGBUR SMALL (A) 04/09/2022 2135   BILIRUBINUR NEGATIVE 04/09/2022 2135   BILIRUBINUR Negative 05/24/2015 [place] 04/09/2022 2135   PROTEINUR 30 (A) 04/09/2022 2135   NITRITE NEGATIVE 04/09/2022 2135   LEUKOCYTESUR NEGATIVE 04/09/2022 2135   Sepsis  Labs: '@LABRCNTIP'$ (procalcitonin:4,lacticidven:4)  ) Recent Results (from the past 240 hour(s))  Resp panel by RT-PCR (RSV, Flu A&B, Covid) Anterior Nasal Swab     Status: None   Collection Time: 04/09/22  5:18 PM   Specimen: Anterior Nasal Swab  Result Value Ref Range Status   SARS Coronavirus 2 by RT PCR NEGATIVE NEGATIVE Final    Comment: (NOTE) SARS-CoV-2 target nucleic acids are NOT DETECTED.  The SARS-CoV-2 RNA is generally detectable in upper respiratory specimens during the acute phase of infection. The lowest concentration of SARS-CoV-2 viral copies this assay can detect is 138 copies/mL. A negative result does not preclude SARS-Cov-2 infection and should not be used as the sole basis for treatment or other patient management decisions. A negative result may occur with  improper specimen collection/handling, submission of specimen other than nasopharyngeal swab, presence of viral mutation(s) within the areas targeted by this assay, and inadequate number of viral copies(<138 copies/mL). A negative result must be combined with clinical observations, patient history, and epidemiological information. The expected result is Negative.  Fact Sheet for Patients:  EntrepreneurPulse.com.au  Fact Sheet for Healthcare Providers:  IncredibleEmployment.be  This test is no t yet approved or cleared by the [place] FDA and  has been authorized for detection and/or diagnosis of SARS-CoV-2 by FDA under an Emergency Use Authorization (EUA). This EUA will remain  in effect (meaning this test can be used) for the duration of the COVID-19 declaration under Section 564(b)(1) of the Act, 21 U.S.C.section 360bbb-3(b)(1), unless the authorization is terminated  or revoked sooner.       Influenza A by PCR NEGATIVE NEGATIVE Final   Influenza B by PCR NEGATIVE NEGATIVE Final    Comment: (NOTE) The Xpert Xpress SARS-CoV-2/FLU/RSV plus assay is intended as  an aid in the diagnosis of influenza from Nasopharyngeal swab specimens and should not be used as a sole basis for treatment. Nasal washings and aspirates are unacceptable for Xpert Xpress SARS-CoV-2/FLU/RSV testing.  Fact Sheet for Patients: EntrepreneurPulse.com.au  Fact Sheet for Healthcare Providers: IncredibleEmployment.be  This test is not yet approved or cleared by the [place] FDA and has been authorized for detection and/or diagnosis of SARS-CoV-2 by FDA under an Emergency Use Authorization (EUA). This EUA will remain in effect (meaning this test can be used) for the duration of the COVID-19 declaration under Section 564(b)(1) of the Act, 21 U.S.C. section 360bbb-3(b)(1), unless the authorization is terminated or revoked.     Resp Syncytial Virus by PCR NEGATIVE NEGATIVE Final    Comment: (NOTE) Fact Sheet for Patients: EntrepreneurPulse.com.au  Fact Sheet for Healthcare Providers: IncredibleEmployment.be  This test is not yet approved or cleared by the [place] FDA and has been authorized for detection and/or diagnosis of SARS-CoV-2 by FDA under an Emergency Use Authorization (EUA). This EUA will remain in effect (meaning this test can be used) for the duration of the COVID-19 declaration under Section 564(b)(1) of the Act, 21 U.S.C. section 360bbb-3(b)(1), unless the authorization is terminated or revoked.  Performed at [facility], [place] [address] [place], [place] [address]   Culture, blood (routine x 2)     Status: Abnormal (Preliminary result)   Collection Time: 04/09/22  5:24 PM   Specimen: BLOOD RIGHT HAND  Result Value Ref Range Status   Specimen Description BLOOD RIGHT HAND  Final   Special Requests   Final    BOTTLES DRAWN AEROBIC AND ANAEROBIC Blood Culture adequate volume   Culture  Setup Time   Final    GRAM POSITIVE COCCI IN CHAINS IN BOTH AEROBIC AND  ANAEROBIC BOTTLES CRITICAL RESULT CALLED TO, READ BACK BY AND VERIFIED WITH: PHARMD ELIZABETH M 1011 [id number] FCP    Culture (A)  Final    GROUP B STREP(S.AGALACTIAE)ISOLATED SUSCEPTIBILITIES TO FOLLOW Performed at [facility], [place] [address], [place], [place] [address]    Report Status PENDING  Incomplete  Blood Culture ID Panel (Reflexed)     Status: Abnormal   Collection Time: 04/09/22  5:24 PM  Result Value Ref Range Status   Enterococcus faecalis NOT DETECTED NOT DETECTED Final   Enterococcus Faecium NOT DETECTED NOT DETECTED Final   Listeria monocytogenes NOT DETECTED NOT DETECTED Final   Staphylococcus species NOT DETECTED NOT DETECTED Final   Staphylococcus aureus (BCID) NOT DETECTED NOT DETECTED Final   Staphylococcus epidermidis NOT DETECTED NOT DETECTED Final   Staphylococcus lugdunensis NOT DETECTED NOT DETECTED Final   Streptococcus species DETECTED (A) NOT DETECTED Final    Comment: CRITICAL RESULT CALLED TO, READ BACK BY AND VERIFIED WITH: PHARMD ELIZABETH M 1011  [id number] FCP    Streptococcus agalactiae DETECTED (A) NOT DETECTED Final    Comment: CRITICAL RESULT CALLED TO, READ BACK BY AND VERIFIED WITH: PHARMD ELIZABETH M 1011 [id number] FCP    Streptococcus pneumoniae NOT DETECTED NOT DETECTED Final   Streptococcus pyogenes NOT DETECTED NOT DETECTED Final   A.calcoaceticus-baumannii NOT DETECTED NOT DETECTED Final   Bacteroides fragilis NOT DETECTED NOT DETECTED Final   Enterobacterales NOT DETECTED NOT DETECTED Final   Enterobacter cloacae complex NOT DETECTED NOT DETECTED Final   Escherichia coli NOT DETECTED NOT DETECTED Final   Klebsiella aerogenes NOT DETECTED NOT DETECTED Final   Klebsiella oxytoca NOT DETECTED NOT DETECTED Final   Klebsiella pneumoniae NOT DETECTED NOT DETECTED Final   Proteus species NOT DETECTED NOT DETECTED Final   Salmonella species NOT DETECTED NOT DETECTED Final   Serratia marcescens NOT DETECTED NOT DETECTED Final   Haemophilus  influenzae NOT DETECTED NOT DETECTED Final   Neisseria meningitidis NOT DETECTED NOT DETECTED Final   Pseudomonas aeruginosa NOT DETECTED NOT DETECTED Final   Stenotrophomonas maltophilia NOT DETECTED NOT DETECTED Final   Candida albicans NOT DETECTED NOT DETECTED Final   Candida auris NOT DETECTED NOT DETECTED Final   Candida glabrata NOT DETECTED NOT DETECTED Final   Candida krusei NOT DETECTED NOT DETECTED Final   Candida parapsilosis NOT DETECTED NOT DETECTED Final   Candida tropicalis NOT DETECTED NOT DETECTED Final   Cryptococcus neoformans/gattii NOT DETECTED NOT DETECTED Final    Comment: Performed at [facility], [address] [place], [place] [address]  Culture, blood (routine x 2)     Status: Abnormal (Preliminary result)   Collection Time: 04/09/22  5:37 PM   Specimen: BLOOD LEFT HAND  Result Value Ref Range Status   Specimen Description BLOOD LEFT HAND  Final   Special Requests   Final    BOTTLES DRAWN AEROBIC AND ANAEROBIC Blood Culture adequate volume   Culture  Setup Time   Final    GRAM POSITIVE COCCI IN CHAINS IN BOTH AEROBIC AND ANAEROBIC BOTTLES CRITICAL VALUE NOTED.  VALUE IS CONSISTENT WITH PREVIOUSLY REPORTED AND CALLED VALUE. Performed at [facility], [place] [address], [place], [place] [address]    Culture GROUP B STREP(S.AGALACTIAE)ISOLATED (A)  Final   Report Status PENDING  Incomplete  MRSA Next Gen by PCR, Nasal     Status: None   Collection Time: 04/10/22  6:25 AM   Specimen: Nasal Mucosa; Nasal Swab  Result Value Ref Range Status   MRSA by PCR Next Gen NOT DETECTED NOT DETECTED Final    Comment: (NOTE) The GeneXpert MRSA Assay (FDA approved for NASAL specimens only), is one component of a comprehensive MRSA colonization surveillance program. It is not intended to diagnose MRSA infection nor to guide or monitor treatment for MRSA infections. Test performance is not FDA approved in patients less than 77 years old. Performed at [facility], [place] [address], [place], [place] [address]   Culture, blood (Routine X 2) w Reflex to ID Panel     Status: None (Preliminary result)   Collection Time: 04/10/22  1:33 PM   Specimen: BLOOD RIGHT HAND  Result Value Ref Range Status   Specimen Description BLOOD RIGHT HAND  Final   Special Requests   Final    BOTTLES DRAWN AEROBIC AND ANAEROBIC Blood Culture adequate volume   Culture   Final    NO GROWTH < 12 HOURS Performed at [facility], [place]  [address] [place], [place] [address]    Report Status PENDING  Incomplete      Studies: ECHOCARDIOGRAM COMPLETE  Result Date: 04/11/2022    ECHOCARDIOGRAM REPORT   Patient Name:   Douglas Perkins. Date of Exam: 04/11/2022 Medical Rec #:  [id number]           Height:       72.0 in Accession #:    [id number]          Weight:       214.9 lb Date of Birth:  1945/11/29           BSA:          2.197 m Patient Age:    21 years            BP:           128/63 mmHg Patient Gender: M                   HR:           76 bpm. Exam Location:  Inpatient Procedure: 2D Echo, Cardiac Doppler, Color Doppler and Intracardiac            Opacification Agent Indications:    Bacteremia  History:        Patient has prior history of Echocardiogram examinations, most                 recent 05/22/2017. CAD, Abnormal ECG and Pacemaker,                 Arrythmias:Bradycardia, Signs/Symptoms:Chest Pain, Bacteremia,                 Syncope and Dyspnea; Risk Factors:Hypertension, Sleep Apnea and                 Dyslipidemia. Cancer.  Sonographer:    Roseanna Rainbow RDCS Referring Phys: [id number] CORNELIUS N VAN DAM  Sonographer Comments: Technically difficult study due to poor echo windows, suboptimal parasternal window, suboptimal apical window, suboptimal subcostal window and patient is obese. Image acquisition challenging due to patient body habitus. Difficult study, patient moving and uncomfortable. IMPRESSIONS  1. The aortic valve is abnormal; trileaflet aortic valve with mild  calcification. In the PLAX, cannot exclude echodensity on the ventricular surfance of the aortic valve. No AI in this region. There is mild calcification of the aortic valve. There is mild thickening of the aortic valve. Aortic valve regurgitation is not visualized.  2. Left ventricular ejection fraction, by estimation, is 60 to 65%. The left ventricle has normal function. The left ventricle has no regional wall motion abnormalities. There is moderate concentric left ventricular hypertrophy. Left ventricular diastolic parameters are indeterminate.  3. Right ventricular systolic function is hyperdynamic. The right ventricular size is normal. Tricuspid regurgitation signal is inadequate for assessing PA pressure.  4. The mitral valve is normal in structure. No evidence of mitral valve regurgitation. No evidence of mitral stenosis.  5. The inferior vena cava is normal in size with greater than 50% respiratory variability, suggesting right atrial pressure of 3 mmHg. Comparison(s): No prior Echocardiogram. Conclusion(s)/Recommendation(s): In the setting of bacteremia, consider TEE. FINDINGS  Left Ventricle: Left ventricular ejection fraction, by estimation, is 60 to 65%. The left ventricle has normal function. The left ventricle has no regional wall motion abnormalities. Definity contrast agent was given IV to delineate the left ventricular  endocardial borders. The left ventricular internal cavity size was normal in size. There is moderate concentric  left ventricular hypertrophy. Left ventricular diastolic parameters are indeterminate. Right Ventricle: The right ventricular size is normal. No increase in right ventricular wall thickness. Right ventricular systolic function is hyperdynamic. Tricuspid regurgitation signal is inadequate for assessing PA pressure. Left Atrium: Left atrial size was normal in size. Right Atrium: Right atrial size was normal in size. Pericardium: There is no evidence of pericardial effusion.  Mitral Valve: The mitral valve is normal in structure. No evidence of mitral valve regurgitation. No evidence of mitral valve stenosis. Tricuspid Valve: The tricuspid valve is normal in structure. Tricuspid valve regurgitation is not demonstrated. No evidence of tricuspid stenosis. Aortic Valve: The aortic valve is abnormal. There is mild calcification of the aortic valve. There is mild thickening of the aortic valve. Aortic valve regurgitation is not visualized. Pulmonic Valve: The pulmonic valve was not well visualized. Pulmonic valve regurgitation is trivial. Aorta: The aortic root and ascending aorta are structurally normal, with no evidence of dilitation. Venous: The inferior vena cava is normal in size with greater than 50% respiratory variability, suggesting right atrial pressure of 3 mmHg. IAS/Shunts: No atrial level shunt detected by color flow Doppler.  LEFT VENTRICLE PLAX 2D LVIDd:         5.40 cm LVIDs:         3.70 cm LV PW:         1.30 cm LV IVS:        1.20 cm LVOT diam:     2.50 cm LV SV:         67 LV SV Index:   31 LVOT Area:     4.91 cm  LV Volumes (MOD) LV vol d, MOD A2C: 217.0 ml LV vol d, MOD A4C: 171.0 ml LV vol s, MOD A2C: 88.8 ml LV vol s, MOD A4C: 73.2 ml LV SV MOD A2C:     128.2 ml LV SV MOD A4C:     171.0 ml LV SV MOD BP:      114.3 ml RIGHT VENTRICLE             IVC RV S prime:     12.60 cm/s  IVC diam: 1.90 cm TAPSE (M-mode): 2.8 cm LEFT ATRIUM           Index        RIGHT ATRIUM           Index LA diam:      3.70 cm 1.68 cm/m   RA Area:     16.00 cm LA Vol (A2C): 37.8 ml 17.21 ml/m  RA Volume:   40.10 ml  18.25 ml/m LA Vol (A4C): 31.2 ml 14.20 ml/m  AORTIC VALVE LVOT Vmax:   100.53 cm/s LVOT Vmean:  66.033 cm/s LVOT VTI:    0.137 m  AORTA Ao Root diam: 3.60 cm Ao Asc diam:  3.20 cm MITRAL VALVE MV Area (PHT): 4.76 cm     SHUNTS MV Decel Time: 159 msec     Systemic VTI:  0.14 m MV E velocity: 119.33 cm/s  Systemic Diam: 2.50 cm Rudean Haskell MD Electronically signed by  Rudean Haskell MD Signature Date/Time: 04/11/2022/10:48:29 AM    Final     Scheduled Meds:  aspirin EC  81 mg Oral Daily   atorvastatin  40 mg Oral Daily   divalproex  1,000 mg Oral QHS   dorzolamide  1 drop Right Eye BID   enoxaparin (LOVENOX) injection  40 mg Subcutaneous Q24H   gabapentin  300 mg Oral BID  latanoprost  1 drop Right Eye BID   melatonin  10 mg Oral QHS   mometasone-formoterol  2 puff Inhalation BID   pantoprazole  40 mg Oral BID   QUEtiapine  300 mg Oral QHS   tamsulosin  0.4 mg Oral QHS   timolol  1 drop Right Eye BID    Continuous Infusions:  penicillin G potassium 12 Million Units in dextrose 5 % 500 mL continuous infusion 12 Million Units (04/11/22 1610)     LOS: 1 day     Alma Friendly, MD [facility]  If 7PM-7AM, please contact night-coverage [email or web address] 04/11/2022, 5:17 PM

## 2022-04-11 NOTE — ED Notes (Signed)
ED TO INPATIENT HANDOFF REPORT  S Name/Age/Gender Douglas Perkins. 76 y.o. male Room/Bed: 003C/003C  Code Status   Code Status: Full Code  Home/SNF/Other Home Patient oriented to: self, place, time, and situation Is this baseline? Yes   Triage Complete: Triage complete  Chief Complaint Sepsis [facility]) [A41.9]  Triage Note The pt [facility]  the pt fell earlier today  unknown time   he is alert on arrival following commands  he does not rfemember the fall  no blood thinner    respirations rapid  hr id c/o  some lower abd he vomityed just now  approx 35m undigested food    alert and pain  skin hot and moist  he re nemains nauseated  The pt is alert and oriented   he just vomited approx 3069mundigested folod  pain in the rt sholulder  3/10   Allergies Allergies  Allergen Reactions   Other Hives, Shortness Of Breath and Other (See Comments)    Pt states that he is allergic to unwashed blood products.   Other reaction(s): Other (See Comments) UNWASHED BLOOD PRODUCTS  Pt states that he is allergic to unwashed blood products.     Feldene [Piroxicam] Hives   Sulfa Antibiotics Hives    Level of Care/Admitting Diagnosis ED Disposition     ED Disposition  Admit   Condition  --   Comment  Hospital Area: [id number]]  Level of Care: Progressive [102]  Admit to Progressive based on following criteria: MULTISYSTEM THREATS such as stable sepsis, metabolic/electrolyte imbalance with or without encephalopathy that is responding to early treatment.  May admit patient to [facility] equivalent level of care is available:: No  Covid Evaluation: Asymptomatic - no recent exposure (last 10 days) testing not required  [facility]: [id number]]Admitting Physician: EZAlma Friendly1[[id number]]Attending Physician: EZAlma Friendly1[[id number]]Certification:: I certify this patient  will need inpatient services for at least 2 midnights  Estimated Length of Stay: 2          B Medical/Surgery History Past Medical History:  Diagnosis Date   Bipolar 1 disorder (HC[facility]riaro[facility]   Cancer (HCPrentice   prostate   Glaucoma    Hypertension    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[place]   Mural thrombus of cardiac apex    Presence of permanent cardiac pacemaker 2017   SA node dysfunction   Sleep apnea    does not use CPAP   TIA (transient ischemic attack)    Past Surgical History:  Procedure Laterality Date   CHOLECYSTECTOMY     COLONOSCOPY WITH PROPOFOL N/A 01/31/2017   Procedure: COLONOSCOPY WITH PROPOFOL;  Surgeon: ElManya SilvasMD;  [facility]  Service: Endoscopy;  Laterality: N/A;   DRUG INDUCED ENDOSCOPY N/A 05/12/2020   Procedure: DRUG INDUCED ENDOSCOPY;  Surgeon: BaMelida QuitterMD;  Location: [facility] Service: ENT;  Laterality: N/A;   IMPLANTATION OF HYPOGLOSSAL NERVE STIMULATOR N/A 07/21/2020   Procedure: IMPLANTATION OF HYPOGLOSSAL NERVE STIMULATOR;  Surgeon: BaMelida QuitterMD;  Location: [place] Service: ENT;  Laterality: N/A;   LEG SURGERY Left    distal   Pace maker placement     PROSTATE SURGERY     PROSTATECTOMY     SKIN CANCER EXCISION  [id number] Merkle Cell Carcinoma     A IV  Location/Drains/Wounds Patient Lines/Drains/Airways Status     Active Line/Drains/Airways     Name Placement date Placement time Site Days   Peripheral IV 04/09/22 20 G Left Hand 04/09/22  1700  Hand  2   Peripheral IV 04/09/22 20 G 1.88" Right;Upper Antecubital 04/09/22  2104  Antecubital  2   Incision (Closed) 05/12/20 Nose Other (Comment) 05/12/20  1145  -- 699   Incision (Closed) 07/21/20 Neck Right 07/21/20  1029  -- 629   Incision (Closed) 07/21/20 Chest Right 07/21/20  1029  -- 629   Pressure Injury 02/18/21 Buttocks Right Stage 1 -  Intact skin with non-blanchable redness of a localized area usually over a bony prominence. 1x1  nonblanchable 02/18/21  1845  -- 417   Wound / Incision (Open or Dehisced) 02/18/21 Other (Comment) Abdomen Left 02/18/21  1840  Abdomen  417            Intake/Output Last 24 hours  Intake/Output Summary (Last 24 hours) at 04/11/2022 1351 Last data filed at 04/11/2022 0730 Gross per 24 hour  Intake 1458.97 ml  Output 425 ml  Net 1033.97 ml    Labs/Imaging Results for orders placed or performed during the hospital encounter of 04/09/22 (from the past 48 hour(s))  Resp panel by RT-PCR (RSV, Flu A&B, Covid) Anterior Nasal Swab     Status: None   Collection Time: 04/09/22  5:18 PM   Specimen: Anterior Nasal Swab  Result Value Ref Range   SARS Coronavirus 2 by RT PCR NEGATIVE NEGATIVE    Comment: (NOTE) SARS-CoV-2 target nucleic acids are NOT DETECTED.  The SARS-CoV-2 RNA is generally detectable in upper respiratory specimens during the acute phase of infection. The lowest concentration of SARS-CoV-2 viral copies this assay can detect is 138 copies/mL. A negative result does not preclude SARS-Cov-2 infection and should not be used as the sole basis for treatment or other patient management decisions. A negative result may occur with  improper specimen collection/handling, submission of specimen other than nasopharyngeal swab, presence of viral mutation(s) within the areas targeted by this assay, and inadequate number of viral copies(<138 copies/mL). A negative result must be combined with clinical observations, patient history, and epidemiological information. The expected result is Negative.  Fact Sheet for Patients:  EntrepreneurPulse.com.au  Fact Sheet for Healthcare Providers:  IncredibleEmployment.be  This test is no t yet approved or cleared by the [place] FDA and  has been authorized for detection and/or diagnosis of SARS-CoV-2 by FDA under an Emergency Use Authorization (EUA). This EUA will remain  in effect (meaning this  test can be used) for the duration of the COVID-19 declaration under Section 564(b)(1) of the Act, 21 U.S.C.section 360bbb-3(b)(1), unless the authorization is terminated  or revoked sooner.       Influenza A by PCR NEGATIVE NEGATIVE   Influenza B by PCR NEGATIVE NEGATIVE    Comment: (NOTE) The Xpert Xpress SARS-CoV-2/FLU/RSV plus assay is intended as an aid in the diagnosis of influenza from Nasopharyngeal swab specimens and should not be used as a sole basis for treatment. Nasal washings and aspirates are unacceptable for Xpert Xpress SARS-CoV-2/FLU/RSV testing.  Fact Sheet for Patients: EntrepreneurPulse.com.au  Fact Sheet for Healthcare Providers: IncredibleEmployment.be  This test is not yet approved or cleared by the [place] FDA and has been authorized for detection and/or diagnosis of SARS-CoV-2 by FDA under an Emergency Use Authorization (EUA). This EUA will remain in effect (meaning this test can be used) for the duration of the  COVID-19 declaration under Section 564(b)(1) of the Act, 21 U.S.C. section 360bbb-3(b)(1), unless the authorization is terminated or revoked.     Resp Syncytial Virus by PCR NEGATIVE NEGATIVE    Comment: (NOTE) Fact Sheet for Patients: EntrepreneurPulse.com.au  Fact Sheet for Healthcare Providers: IncredibleEmployment.be  This test is not yet approved or cleared by the [place] FDA and has been authorized for detection and/or diagnosis of SARS-CoV-2 by FDA under an Emergency Use Authorization (EUA). This EUA will remain in effect (meaning this test can be used) for the duration of the COVID-19 declaration under Section 564(b)(1) of the Act, 21 U.S.C. section 360bbb-3(b)(1), unless the authorization is terminated or revoked.  Performed at [facility], [place] [address] [place], [place] [address]   Culture, blood (routine x 2)     Status: Abnormal  (Preliminary result)   Collection Time: 04/09/22  5:24 PM   Specimen: BLOOD RIGHT HAND  Result Value Ref Range   Specimen Description BLOOD RIGHT HAND    Special Requests      BOTTLES DRAWN AEROBIC AND ANAEROBIC Blood Culture adequate volume   Culture  Setup Time      GRAM POSITIVE COCCI IN CHAINS IN BOTH AEROBIC AND ANAEROBIC BOTTLES CRITICAL RESULT CALLED TO, READ BACK BY AND VERIFIED WITH: PHARMD ELIZABETH M 1011 [id number] FCP    Culture (A)     GROUP B STREP(S.AGALACTIAE)ISOLATED SUSCEPTIBILITIES TO FOLLOW Performed at [facility], [place] [address] [place],  [address]    Report Status PENDING   Blood Culture ID Panel (Reflexed)     Status: Abnormal   Collection Time: 04/09/22  5:24 PM  Result Value Ref Range   Enterococcus faecalis NOT DETECTED NOT DETECTED   Enterococcus Faecium NOT DETECTED NOT DETECTED   Listeria monocytogenes NOT DETECTED NOT DETECTED   Staphylococcus species NOT DETECTED NOT DETECTED   Staphylococcus aureus (BCID) NOT DETECTED NOT DETECTED   Staphylococcus epidermidis NOT DETECTED NOT DETECTED   Staphylococcus lugdunensis NOT DETECTED NOT DETECTED   Streptococcus species DETECTED (A) NOT DETECTED    Comment: CRITICAL RESULT CALLED TO, READ BACK BY AND VERIFIED WITH: PHARMD ELIZABETH M 1011 [id number] FCP    Streptococcus agalactiae DETECTED (A) NOT DETECTED    Comment: CRITICAL RESULT CALLED TO, READ BACK BY AND VERIFIED WITH: PHARMD ELIZABETH M 1011 [id number] FCP    Streptococcus pneumoniae NOT DETECTED NOT DETECTED   Streptococcus pyogenes NOT DETECTED NOT DETECTED   A.calcoaceticus-baumannii NOT DETECTED NOT DETECTED   Bacteroides fragilis NOT DETECTED NOT DETECTED   Enterobacterales NOT DETECTED NOT DETECTED   Enterobacter cloacae complex NOT DETECTED NOT DETECTED   Escherichia coli NOT DETECTED NOT DETECTED   Klebsiella aerogenes NOT DETECTED NOT DETECTED   Klebsiella oxytoca NOT DETECTED NOT DETECTED   Klebsiella pneumoniae NOT DETECTED  NOT DETECTED   Proteus species NOT DETECTED NOT DETECTED   Salmonella species NOT DETECTED NOT DETECTED   Serratia marcescens NOT DETECTED NOT DETECTED   Haemophilus influenzae NOT DETECTED NOT DETECTED   Neisseria meningitidis NOT DETECTED NOT DETECTED   Pseudomonas aeruginosa NOT DETECTED NOT DETECTED   Stenotrophomonas maltophilia NOT DETECTED NOT DETECTED   Candida albicans NOT DETECTED NOT DETECTED   Candida auris NOT DETECTED NOT DETECTED   Candida glabrata NOT DETECTED NOT DETECTED   Candida krusei NOT DETECTED NOT DETECTED   Candida parapsilosis NOT DETECTED NOT DETECTED   Candida tropicalis NOT DETECTED NOT DETECTED   Cryptococcus neoformans/gattii NOT DETECTED NOT DETECTED    Comment: Performed  at [facility], [place] [address], [place], [place] [address]  Lactic acid, plasma     Status: Abnormal   Collection Time: 04/09/22  5:25 PM  Result Value Ref Range   Lactic Acid, Venous 3.5 (HH) 0.5 - 1.9 mmol/L    Comment: CRITICAL RESULT CALLED TO, READ BACK BY AND VERIFIED WITH M,BARBER RN '@[id number]'$  04/09/22 E,BENTON Performed at [facility], [place] [address], [place], [place] [address]   Type and screen [place]     Status: None   Collection Time: 04/09/22  5:35 PM  Result Value Ref Range   ABO/RH(D) A POS    Antibody Screen NEG    Sample Expiration      04/12/2022,2359 Performed at [facility], [place] [address] [place], [place] [address]   Culture, blood (routine x 2)     Status: None (Preliminary result)   Collection Time: 04/09/22  5:37 PM   Specimen: BLOOD LEFT HAND  Result Value Ref Range   Specimen Description BLOOD LEFT HAND    Special Requests      BOTTLES DRAWN AEROBIC AND ANAEROBIC Blood Culture adequate volume   Culture  Setup Time      GRAM POSITIVE COCCI IN CHAINS IN BOTH AEROBIC AND ANAEROBIC BOTTLES CRITICAL VALUE NOTED.  VALUE IS CONSISTENT WITH PREVIOUSLY REPORTED AND CALLED VALUE. Performed at [facility],  [place] [address] [place], [place] [address]    Culture GRAM POSITIVE COCCI    Report Status PENDING   Comprehensive metabolic panel     Status: Abnormal   Collection Time: 04/09/22  5:37 PM  Result Value Ref Range   Sodium 134 (L) 135 - 145 mmol/L   Potassium 3.6 3.5 - 5.1 mmol/L   Chloride 100 98 - 111 mmol/L   CO2 20 (L) 22 - 32 mmol/L   Glucose, Bld 120 (H) 70 - 99 mg/dL    Comment: Glucose reference range applies only to samples taken after fasting for at least 8 hours.   BUN 23 8 - 23 mg/dL   Creatinine, Ser 1.24 0.61 - 1.24 mg/dL   Calcium 8.9 8.9 - 10.3 mg/dL   Total Protein 7.3 6.5 - 8.1 g/dL   Albumin 3.5 3.5 - 5.0 g/dL   AST 33 15 - 41 U/L   ALT 26 0 - 44 U/L   Alkaline Phosphatase 66 38 - 126 U/L   Total Bilirubin 0.4 0.3 - 1.2 mg/dL   GFR, Estimated 60 (L) >60 mL/min    Comment: (NOTE) Calculated using the CKD-EPI Creatinine Equation (2021)    Anion gap 14 5 - 15    Comment: Performed at [place] [address] [place], [place] [address]  Lipase, blood     Status: None   Collection Time: 04/09/22  5:37 PM  Result Value Ref Range   Lipase 39 11 - 51 U/L    Comment: Performed at [place] [address], [place], [place] [address]  Troponin I (High Sensitivity)     Status: Abnormal   Collection Time: 04/09/22  5:37 PM  Result Value Ref Range   Troponin I (High Sensitivity) 33 (H) <18 ng/L    Comment: (NOTE) Elevated high sensitivity troponin I (hsTnI) values and significant  changes across serial measurements may suggest ACS but many other  chronic and acute conditions are known to elevate hsTnI results.  Refer to the "Links" section for chest pain algorithms and  additional  guidance. Performed at [facility], [place] [address] [place], [place] [address]   Brain natriuretic peptide     Status: None   Collection Time: 04/09/22  5:37 PM  Result Value Ref Range   B Natriuretic Peptide 61.7 0.0 - 100.0 pg/mL    Comment: Performed at [place] [address] [place], [place] [address]  CBC with Differential     Status: Abnormal   Collection Time: 04/09/22  5:37 PM  Result Value Ref Range   WBC 20.4 (H) 4.0 - 10.5 K/uL   RBC 4.34 4.22 - 5.81 MIL/uL   Hemoglobin 13.6 13.0 - 17.0 g/dL   HCT 40.3 39.0 - 52.0 %   MCV 92.9 80.0 - 100.0 fL   MCH 31.3 26.0 - 34.0 pg   MCHC 33.7 30.0 - 36.0 g/dL   RDW 12.5 11.5 - 15.5 %   Platelets 284 150 - 400 K/uL   nRBC 0.0 0.0 - 0.2 %   Neutrophils Relative % 87 %   Neutro Abs 17.5 (H) 1.7 - 7.7 K/uL   Lymphocytes Relative 3 %   Lymphs Abs 0.7 0.7 - 4.0 K/uL   Monocytes Relative 9 %   Monocytes Absolute 1.9 (H) 0.1 - 1.0 K/uL   Eosinophils Relative 0 %   Eosinophils Absolute 0.0 0.0 - 0.5 K/uL   Basophils Relative 0 %   Basophils Absolute 0.0 0.0 - 0.1 K/uL   Immature Granulocytes 1 %   Abs Immature Granulocytes 0.27 (H) 0.00 - 0.07 K/uL    Comment: Performed at [facility], [address], [place], [place] [address]  Protime-INR     Status: None   Collection Time: 04/09/22  5:37 PM  Result Value Ref Range   Prothrombin Time 14.0 11.4 - 15.2 seconds   INR 1.1 0.8 - 1.2    Comment: (NOTE) INR goal varies based on device and disease states. Performed at [facility], [place] [address] [place], [place] [address]   I-stat chem 8, ED     Status: Abnormal   Collection Time: 04/09/22  5:48 PM  Result Value Ref Range   Sodium 135 135 - 145 mmol/L   Potassium 3.7 3.5 - 5.1 mmol/L   Chloride 101 98 - 111 mmol/L   BUN 23 8 - 23 mg/dL   Creatinine, Ser 1.00 0.61 - 1.24 mg/dL   Glucose, Bld 125 (H) 70 - 99 mg/dL    Comment: Glucose reference range applies only to samples taken after fasting for at least 8 hours.   Calcium, Ion 1.05 (L) 1.15 - 1.40 mmol/L   TCO2 22 22 - 32 mmol/L   Hemoglobin 13.6 13.0 - 17.0 g/dL   HCT 40.0 39.0 - 52.0 %  Lactic acid, plasma     Status: Abnormal   Collection Time: 04/09/22  8:10 PM  Result Value Ref Range   Lactic Acid, Venous  2.6 (HH) 0.5 - 1.9 mmol/L    Comment: CRITICAL VALUE NOTED. VALUE IS CONSISTENT WITH PREVIOUSLY REPORTED/CALLED VALUE Performed at [facility], [place] [address], [name], [place] [address]   Troponin I (High Sensitivity)     Status: Abnormal   Collection Time: 04/09/22  8:10 PM  Result Value Ref Range   Troponin I (High Sensitivity) 54 (H) <18 ng/L    Comment: RESULT CALLED TO, READ BACK BY AND VERIFIED WITH CRYSTAL BLACK RN 04/09/22 2105 M KOROLESKI (NOTE) Elevated high sensitivity troponin I (  hsTnI) values and significant  changes across serial measurements may suggest ACS but many other  chronic and acute conditions are known to elevate hsTnI results.  Refer to the "Links" section for chest pain algorithms and additional  guidance. Performed at [facility], [place] [address], [place], [place] [address]   CK     Status: Abnormal   Collection Time: 04/09/22  8:10 PM  Result Value Ref Range   Total CK 847 (H) 49 - 397 U/L    Comment: Performed at [facility], [place] [address], [place], [place] [address]  Urinalysis, Routine w reflex microscopic Urine, Clean Catch     Status: Abnormal   Collection Time: 04/09/22  9:35 PM  Result Value Ref Range   Color, Urine YELLOW YELLOW   APPearance HAZY (A) CLEAR   Specific Gravity, Urine 1.017 1.005 - 1.030   pH 6.0 5.0 - 8.0   Glucose, UA NEGATIVE NEGATIVE mg/dL   Hgb urine dipstick SMALL (A) NEGATIVE   Bilirubin Urine NEGATIVE NEGATIVE   Ketones, ur NEGATIVE NEGATIVE mg/dL   Protein, ur 30 (A) NEGATIVE mg/dL   Nitrite NEGATIVE NEGATIVE   Leukocytes,Ua NEGATIVE NEGATIVE   RBC / HPF 0-5 0 - 5 RBC/hpf   WBC, UA 0-5 0 - 5 WBC/hpf   Bacteria, UA NONE SEEN NONE SEEN   Squamous Epithelial / LPF 0-5 0 - 5   Mucus PRESENT    Hyaline Casts, UA PRESENT     Comment: Performed at [facility], [place] [address] [place], [place] [address]  Lactic acid, plasma     Status: Abnormal   Collection Time: 04/10/22 12:02 AM  Result  Value Ref Range   Lactic Acid, Venous 2.0 (HH) 0.5 - 1.9 mmol/L    Comment: CRITICAL VALUE NOTED. VALUE IS CONSISTENT WITH PREVIOUSLY REPORTED/CALLED VALUE Performed at [facility], [place] [address] [place], [place] [address]   Protime-INR     Status: None   Collection Time: 04/10/22  5:00 AM  Result Value Ref Range   Prothrombin Time 14.6 11.4 - 15.2 seconds   INR 1.2 0.8 - 1.2    Comment: (NOTE) INR goal varies based on device and disease states. Performed at [facility], [place] [address], [place], [place] [address]   Cortisol-am, blood     Status: None   Collection Time: 04/10/22  5:00 AM  Result Value Ref Range   Cortisol - AM 21.7 6.7 - 22.6 ug/dL    Comment: Performed at [facility], [place] [address] [place], [place] [address]  Procalcitonin     Status: None   Collection Time: 04/10/22  5:00 AM  Result Value Ref Range   Procalcitonin 18.83 ng/mL    Comment:        Interpretation: PCT >= 10 ng/mL: Important systemic inflammatory response, almost exclusively due to severe bacterial sepsis or septic shock. (NOTE)       Sepsis PCT Algorithm           Lower Respiratory Tract                                      Infection PCT Algorithm    ----------------------------     ----------------------------         PCT < 0.25 ng/mL  PCT < 0.10 ng/mL          Strongly encourage             Strongly discourage   discontinuation of antibiotics    initiation of antibiotics    ----------------------------     -----------------------------       PCT 0.25 - 0.50 ng/mL            PCT 0.10 - 0.25 ng/mL               OR       >80% decrease in PCT            Discourage initiation of                                            antibiotics      Encourage discontinuation           of antibiotics    ----------------------------     -----------------------------         PCT >= 0.50 ng/mL              PCT 0.26 - 0.50 ng/mL                AND       <80% decrease in  PCT             Encourage initiation of                                             antibiotics       Encourage continuation           of antibiotics    ----------------------------     -----------------------------        PCT >= 0.50 ng/mL                  PCT > 0.50 ng/mL               AND         increase in PCT                  Strongly encourage                                      initiation of antibiotics    Strongly encourage escalation           of antibiotics                                     -----------------------------                                           PCT <= 0.25 ng/mL                                                 OR                                        >  80% decrease in PCT                                      Discontinue / Do not initiate                                             antibiotics  Performed at [facility], [place] [address] [place], [place] [address]   CBC     Status: Abnormal   Collection Time: 04/10/22  5:00 AM  Result Value Ref Range   WBC 31.7 (H) 4.0 - 10.5 K/uL   RBC 3.75 (L) 4.22 - 5.81 MIL/uL   Hemoglobin 12.0 (L) 13.0 - 17.0 g/dL   HCT 35.0 (L) 39.0 - 52.0 %   MCV 93.3 80.0 - 100.0 fL   MCH 32.0 26.0 - 34.0 pg   MCHC 34.3 30.0 - 36.0 g/dL   RDW 12.9 11.5 - 15.5 %   Platelets 226 150 - 400 K/uL   nRBC 0.0 0.0 - 0.2 %    Comment: Performed at [facility], [place] [address] [place], [place] [address]  Comprehensive metabolic panel     Status: Abnormal   Collection Time: 04/10/22  5:00 AM  Result Value Ref Range   Sodium 134 (L) 135 - 145 mmol/L   Potassium 3.8 3.5 - 5.1 mmol/L   Chloride 100 98 - 111 mmol/L   CO2 24 22 - 32 mmol/L   Glucose, Bld 150 (H) 70 - 99 mg/dL    Comment: Glucose reference range applies only to samples taken after fasting for at least 8 hours.   BUN 17 8 - 23 mg/dL   Creatinine, Ser 0.88 0.61 - 1.24 mg/dL   Calcium 7.9 (L) 8.9 - 10.3 mg/dL   Total Protein 6.4 (L) 6.5 - 8.1 g/dL    Albumin 2.8 (L) 3.5 - 5.0 g/dL   AST 56 (H) 15 - 41 U/L   ALT 26 0 - 44 U/L   Alkaline Phosphatase 52 38 - 126 U/L   Total Bilirubin 0.5 0.3 - 1.2 mg/dL   GFR, Estimated >60 >60 mL/min    Comment: (NOTE) Calculated using the CKD-EPI Creatinine Equation (2021)    Anion gap 10 5 - 15    Comment: Performed at [facility], [place] [address] [place], [place] [address]  Lactic acid, plasma     Status: Abnormal   Collection Time: 04/10/22  5:00 AM  Result Value Ref Range   Lactic Acid, Venous 2.2 (HH) 0.5 - 1.9 mmol/L    Comment: CRITICAL VALUE NOTED. VALUE IS CONSISTENT WITH PREVIOUSLY REPORTED/CALLED VALUE Performed at [facility], [place] [address], [place], [place] [address]   MRSA Next Gen by PCR, Nasal     Status: None   Collection Time: 04/10/22  6:25 AM   Specimen: Nasal Mucosa; Nasal Swab  Result Value Ref Range   MRSA by PCR Next Gen NOT DETECTED NOT DETECTED    Comment: (NOTE) The GeneXpert MRSA Assay (FDA approved for NASAL specimens only), is one component of a comprehensive MRSA colonization surveillance program. It is not intended to diagnose MRSA infection nor to guide or monitor treatment for MRSA infections. Test performance is not FDA approved in patients less than 35 years old.  Performed at [facility], [place] [address], [place], [place] [address]   Procalcitonin     Status: None   Collection Time: 04/11/22  7:55 AM  Result Value Ref Range   Procalcitonin 10.86 ng/mL    Comment:        Interpretation: PCT >= 10 ng/mL: Important systemic inflammatory response, almost exclusively due to severe bacterial sepsis or septic shock. (NOTE)       Sepsis PCT Algorithm           Lower Respiratory Tract                                      Infection PCT Algorithm    ----------------------------     ----------------------------         PCT < 0.25 ng/mL                PCT < 0.10 ng/mL          Strongly encourage             Strongly discourage    discontinuation of antibiotics    initiation of antibiotics    ----------------------------     -----------------------------       PCT 0.25 - 0.50 ng/mL            PCT 0.10 - 0.25 ng/mL               OR       >80% decrease in PCT            Discourage initiation of                                            antibiotics      Encourage discontinuation           of antibiotics    ----------------------------     -----------------------------         PCT >= 0.50 ng/mL              PCT 0.26 - 0.50 ng/mL                AND       <80% decrease in PCT             Encourage initiation of                                             antibiotics       Encourage continuation           of antibiotics    ----------------------------     -----------------------------        PCT >= 0.50 ng/mL                  PCT > 0.50 ng/mL               AND         increase in PCT                  Strongly encourage  initiation of antibiotics    Strongly encourage escalation           of antibiotics                                     -----------------------------                                           PCT <= 0.25 ng/mL                                                 OR                                        > 80% decrease in PCT                                      Discontinue / Do not initiate                                             antibiotics  Performed at [facility], [address] [place], [place] [address]   CBC with Differential/Platelet     Status: Abnormal   Collection Time: 04/11/22  7:55 AM  Result Value Ref Range   WBC 21.1 (H) 4.0 - 10.5 K/uL   RBC 3.49 (L) 4.22 - 5.81 MIL/uL   Hemoglobin 11.2 (L) 13.0 - 17.0 g/dL   HCT 33.3 (L) 39.0 - 52.0 %   MCV 95.4 80.0 - 100.0 fL   MCH 32.1 26.0 - 34.0 pg   MCHC 33.6 30.0 - 36.0 g/dL   RDW 13.1 11.5 - 15.5 %   Platelets 202 150 - 400 K/uL   nRBC 0.0 0.0 - 0.2 %   Neutrophils Relative % 82 %    Neutro Abs 17.2 (H) 1.7 - 7.7 K/uL   Lymphocytes Relative 8 %   Lymphs Abs 1.8 0.7 - 4.0 K/uL   Monocytes Relative 9 %   Monocytes Absolute 1.9 (H) 0.1 - 1.0 K/uL   Eosinophils Relative 0 %   Eosinophils Absolute 0.1 0.0 - 0.5 K/uL   Basophils Relative 0 %   Basophils Absolute 0.1 0.0 - 0.1 K/uL   Immature Granulocytes 1 %   Abs Immature Granulocytes 0.10 (H) 0.00 - 0.07 K/uL    Comment: Performed at [facility] [address] [place], [place] [address]  Basic metabolic panel     Status: Abnormal   Collection Time: 04/11/22  7:55 AM  Result Value Ref Range   Sodium 135 135 - 145 mmol/L   Potassium 3.9 3.5 - 5.1 mmol/L   Chloride 99 98 - 111 mmol/L   CO2 25 22 - 32 mmol/L   Glucose, Bld 120 (H) 70 - 99 mg/dL    Comment: Glucose reference range applies only to samples taken after fasting for at  least 8 hours.   BUN 13 8 - 23 mg/dL   Creatinine, Ser 0.58 (L) 0.61 - 1.24 mg/dL   Calcium 8.0 (L) 8.9 - 10.3 mg/dL   GFR, Estimated >60 >60 mL/min    Comment: (NOTE) Calculated using the CKD-EPI Creatinine Equation (2021)    Anion gap 11 5 - 15    Comment: Performed at [facility] [address] [place], [place] [address]  Lactic acid, plasma     Status: None   Collection Time: 04/11/22  7:55 AM  Result Value Ref Range   Lactic Acid, Venous 0.8 0.5 - 1.9 mmol/L    Comment: Performed at [facility] [address], [place], [place] [address]   ECHOCARDIOGRAM COMPLETE  Result Date: 04/11/2022    ECHOCARDIOGRAM REPORT   Patient Name:   Douglas Perkins. Date of Exam: 04/11/2022 Medical Rec #:  [id number]           Height:       72.0 in Accession #:    [id number]          Weight:       214.9 lb Date of Birth:  January 09, 1946           BSA:          2.197 m Patient Age:    71 years            BP:           128/63 mmHg Patient Gender: M                   HR:           76 bpm. Exam Location:  Inpatient Procedure: 2D Echo, Cardiac Doppler, Color Doppler and Intracardiac             Opacification Agent Indications:    Bacteremia  History:        Patient has prior history of Echocardiogram examinations, most                 recent 05/22/2017. CAD, Abnormal ECG and Pacemaker,                 Arrythmias:Bradycardia, Signs/Symptoms:Chest Pain, Bacteremia,                 Syncope and Dyspnea; Risk Factors:Hypertension, Sleep Apnea and                 Dyslipidemia. Cancer.  Sonographer:    Roseanna Rainbow RDCS Referring Phys: [id number] CORNELIUS N VAN DAM  Sonographer Comments: Technically difficult study due to poor echo windows, suboptimal parasternal window, suboptimal apical window, suboptimal subcostal window and patient is obese. Image acquisition challenging due to patient body habitus. Difficult study, patient moving and uncomfortable. IMPRESSIONS  1. The aortic valve is abnormal; trileaflet aortic valve with mild calcification. In the PLAX, cannot exclude echodensity on the ventricular surfance of the aortic valve. No AI in this region. There is mild calcification of the aortic valve. There is mild thickening of the aortic valve. Aortic valve regurgitation is not visualized.  2. Left ventricular ejection fraction, by estimation, is 60 to 65%. The left ventricle has normal function. The left ventricle has no regional wall motion abnormalities. There is moderate concentric left ventricular hypertrophy. Left ventricular diastolic parameters are indeterminate.  3. Right ventricular systolic function is hyperdynamic. The right ventricular size is normal. Tricuspid regurgitation signal is inadequate for assessing PA pressure.  4. The  mitral valve is normal in structure. No evidence of mitral valve regurgitation. No evidence of mitral stenosis.  5. The inferior vena cava is normal in size with greater than 50% respiratory variability, suggesting right atrial pressure of 3 mmHg. Comparison(s): No prior Echocardiogram. Conclusion(s)/Recommendation(s): In the setting of bacteremia, consider TEE. FINDINGS   Left Ventricle: Left ventricular ejection fraction, by estimation, is 60 to 65%. The left ventricle has normal function. The left ventricle has no regional wall motion abnormalities. Definity contrast agent was given IV to delineate the left ventricular  endocardial borders. The left ventricular internal cavity size was normal in size. There is moderate concentric left ventricular hypertrophy. Left ventricular diastolic parameters are indeterminate. Right Ventricle: The right ventricular size is normal. No increase in right ventricular wall thickness. Right ventricular systolic function is hyperdynamic. Tricuspid regurgitation signal is inadequate for assessing PA pressure. Left Atrium: Left atrial size was normal in size. Right Atrium: Right atrial size was normal in size. Pericardium: There is no evidence of pericardial effusion. Mitral Valve: The mitral valve is normal in structure. No evidence of mitral valve regurgitation. No evidence of mitral valve stenosis. Tricuspid Valve: The tricuspid valve is normal in structure. Tricuspid valve regurgitation is not demonstrated. No evidence of tricuspid stenosis. Aortic Valve: The aortic valve is abnormal. There is mild calcification of the aortic valve. There is mild thickening of the aortic valve. Aortic valve regurgitation is not visualized. Pulmonic Valve: The pulmonic valve was not well visualized. Pulmonic valve regurgitation is trivial. Aorta: The aortic root and ascending aorta are structurally normal, with no evidence of dilitation. Venous: The inferior vena cava is normal in size with greater than 50% respiratory variability, suggesting right atrial pressure of 3 mmHg. IAS/Shunts: No atrial level shunt detected by color flow Doppler.  LEFT VENTRICLE PLAX 2D LVIDd:         5.40 cm LVIDs:         3.70 cm LV PW:         1.30 cm LV IVS:        1.20 cm LVOT diam:     2.50 cm LV SV:         67 LV SV Index:   31 LVOT Area:     4.91 cm  LV Volumes (MOD) LV vol d, MOD  A2C: 217.0 ml LV vol d, MOD A4C: 171.0 ml LV vol s, MOD A2C: 88.8 ml LV vol s, MOD A4C: 73.2 ml LV SV MOD A2C:     128.2 ml LV SV MOD A4C:     171.0 ml LV SV MOD BP:      114.3 ml RIGHT VENTRICLE             IVC RV S prime:     12.60 cm/s  IVC diam: 1.90 cm TAPSE (M-mode): 2.8 cm LEFT ATRIUM           Index        RIGHT ATRIUM           Index LA diam:      3.70 cm 1.68 cm/m   RA Area:     16.00 cm LA Vol (A2C): 37.8 ml 17.21 ml/m  RA Volume:   40.10 ml  18.25 ml/m LA Vol (A4C): 31.2 ml 14.20 ml/m  AORTIC VALVE LVOT Vmax:   100.53 cm/s LVOT Vmean:  66.033 cm/s LVOT VTI:    0.137 m  AORTA Ao Root diam: 3.60 cm Ao Asc diam:  3.20 cm MITRAL VALVE  MV Area (PHT): 4.76 cm     SHUNTS MV Decel Time: 159 msec     Systemic VTI:  0.14 m MV E velocity: 119.33 cm/s  Systemic Diam: 2.50 cm Rudean Haskell MD Electronically signed by Rudean Haskell MD Signature Date/Time: 04/11/2022/10:48:29 AM    Final    CT Angio Chest PE W/Cm &/Or Wo Cm  Result Date: 04/09/2022 CLINICAL DATA:  Pulmonary embolism (PE) suspected, high prob; Sepsis. EXAM: CT ANGIOGRAPHY CHEST CT ABDOMEN AND PELVIS WITH CONTRAST TECHNIQUE: Multidetector CT imaging of the chest was performed using the standard protocol during bolus administration of intravenous contrast. Multiplanar CT image reconstructions and MIPs were obtained to evaluate the vascular anatomy. Multidetector CT imaging of the abdomen and pelvis was performed using the standard protocol during bolus administration of intravenous contrast. RADIATION DOSE REDUCTION: This exam was performed according to the departmental dose-optimization program which includes automated exposure control, adjustment of the mA and/or kV according to patient size and/or use of iterative reconstruction technique. CONTRAST:  42m OMNIPAQUE IOHEXOL 350 MG/ML SOLN COMPARISON:  CT chest 06/12/2016, CT abdomen pelvis 06/23/2015 FINDINGS: CTA CHEST FINDINGS Cardiovascular: There is adequate opacification of  the pulmonary arterial tree. No intraluminal filling defect identified to suggest acute pulmonary embolism through the segmental level. The central pulmonary arteries are of normal caliber. Extensive multi-vessel coronary artery calcification. Mild calcification of the aortic valve leaflets. Global cardiac size within normal limits. No pericardial effusion. Mild atherosclerotic calcification within the thoracic aorta. No aortic aneurysm. 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 Mediastinum/Nodes: No enlarged mediastinal, hilar, or axillary lymph nodes. Thyroid gland, trachea, and esophagus demonstrate no significant findings. Lungs/Pleura: Mild bibasilar atelectasis. Lungs are otherwise clear. No pneumothorax or pleural effusion. Musculoskeletal: Neurostimulator battery pack is seen within the right anterior chest wall with its single lead extending into the right neck. Healed left rib fracture noted. No acute bone abnormality. Review of the MIP images confirms the above findings. CT ABDOMEN and PELVIS FINDINGS Hepatobiliary: Tiny cyst within the left hepatic lobe. Liver otherwise unremarkable. Gallbladder absent. No intra or extrahepatic biliary ductal dilation. Pancreas: Unremarkable Spleen: Unremarkable Adrenals/Urinary Tract: The adrenal glands are unremarkable. The kidneys are normal in size and position. Left renal cortical scarring within the interpolar region. Mild bilateral nonspecific perinephric stranding. No loculated perinephric fluid collections. No intrarenal or ureteral calculi. No hydronephrosis. The bladder is unremarkable. Stomach/Bowel: Small epigastric ventral hernia contains a single loop of unremarkable mid transverse colon as well as small amount of mesenteric fat. The stomach, small bowel, and large bowel are otherwise unremarkable. Appendix normal. No free intraperitoneal gas or fluid. Vascular/Lymphatic: Aortic atherosclerosis. No  aortic aneurysm. Extensive atherosclerotic calcification results in probable near occlusion of the right common femoral artery though this is not well assessed on this non arteriographic study. No pathologic abdominal or pelvic lymph nodes. Reproductive: Status post prostatectomy. Other: In addition to above-mentioned ventral hernia containing the transverse colon, a separate small bilobed left parasagittal ventral hernia is present containing mesenteric fat as well as a tiny umbilical hernia containing mesenteric fat. Small right fat containing inguinal hernia is present. Musculoskeletal: There is infiltrative changes within the right inguinal region as well as shotty right inguinal adenopathy which may be posttraumatic or inflammatory in nature. Healed left inferior and superior pubic rami fractures are noted. Degenerative changes are seen within the lumbar spine. No acute bone abnormality. Review of the MIP images confirms the above findings. IMPRESSION: 1. No  pulmonary embolism. No acute intrathoracic pathology identified. 2. Extensive multi-vessel coronary artery calcification. 3. Mild calcification of the aortic valve leaflets. Echocardiography may be helpful to assess the degree of valvular dysfunction. 4. Small epigastric ventral hernia containing a single loop of unremarkable mid transverse colon as well as small amount of mesenteric fat. Additional small bilobed left parasagittal ventral hernia containing mesenteric fat as well as a tiny umbilical hernia containing mesenteric fat. Small fat containing right inguinal hernia. 5. Infiltrative changes within the right inguinal region as well as shotty right inguinal adenopathy which may be posttraumatic or inflammatory in nature. Correlation with clinical examination is recommended. 6. Extensive atherosclerotic calcification within the right common femoral artery resulting in probable near occlusion of the right common femoral artery. If indicated, this would  be better assessed with CT arteriography. Aortic Atherosclerosis (ICD10-I70.0). Electronically Signed   By: Fidela Salisbury M.D.   On: 04/09/2022 21:46   CT Abdomen Pelvis W Contrast  Result Date: 04/09/2022 CLINICAL DATA:  Pulmonary embolism (PE) suspected, high prob; Sepsis. EXAM: CT ANGIOGRAPHY CHEST CT ABDOMEN AND PELVIS WITH CONTRAST TECHNIQUE: Multidetector CT imaging of the chest was performed using the standard protocol during bolus administration of intravenous contrast. Multiplanar CT image reconstructions and MIPs were obtained to evaluate the vascular anatomy. Multidetector CT imaging of the abdomen and pelvis was performed using the standard protocol during bolus administration of intravenous contrast. RADIATION DOSE REDUCTION: This exam was performed according to the departmental dose-optimization program which includes automated exposure control, adjustment of the mA and/or kV according to patient size and/or use of iterative reconstruction technique. CONTRAST:  91m OMNIPAQUE IOHEXOL 350 MG/ML SOLN COMPARISON:  CT chest 06/12/2016, CT abdomen pelvis 06/23/2015 FINDINGS: CTA CHEST FINDINGS Cardiovascular: There is adequate opacification of the pulmonary arterial tree. No intraluminal filling defect identified to suggest acute pulmonary embolism through the segmental level. The central pulmonary arteries are of normal caliber. Extensive multi-vessel coronary artery calcification. Mild calcification of the aortic valve leaflets. Global cardiac size within normal limits. No pericardial effusion. Mild atherosclerotic calcification within the thoracic aorta. No aortic aneurysm. 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 Mediastinum/Nodes: No enlarged mediastinal, hilar, or axillary lymph nodes. Thyroid gland, trachea, and esophagus demonstrate no significant findings. Lungs/Pleura: Mild bibasilar atelectasis. Lungs are otherwise clear. No  pneumothorax or pleural effusion. Musculoskeletal: Neurostimulator battery pack is seen within the right anterior chest wall with its single lead extending into the right neck. Healed left rib fracture noted. No acute bone abnormality. Review of the MIP images confirms the above findings. CT ABDOMEN and PELVIS FINDINGS Hepatobiliary: Tiny cyst within the left hepatic lobe. Liver otherwise unremarkable. Gallbladder absent. No intra or extrahepatic biliary ductal dilation. Pancreas: Unremarkable Spleen: Unremarkable Adrenals/Urinary Tract: The adrenal glands are unremarkable. The kidneys are normal in size and position. Left renal cortical scarring within the interpolar region. Mild bilateral nonspecific perinephric stranding. No loculated perinephric fluid collections. No intrarenal or ureteral calculi. No hydronephrosis. The bladder is unremarkable. Stomach/Bowel: Small epigastric ventral hernia contains a single loop of unremarkable mid transverse colon as well as small amount of mesenteric fat. The stomach, small bowel, and large bowel are otherwise unremarkable. Appendix normal. No free intraperitoneal gas or fluid. Vascular/Lymphatic: Aortic atherosclerosis. No aortic aneurysm. Extensive atherosclerotic calcification results in probable near occlusion of the right common femoral artery though this is not well assessed on this non arteriographic study. No pathologic abdominal or pelvic lymph nodes. Reproductive: Status post  prostatectomy. Other: In addition to above-mentioned ventral hernia containing the transverse colon, a separate small bilobed left parasagittal ventral hernia is present containing mesenteric fat as well as a tiny umbilical hernia containing mesenteric fat. Small right fat containing inguinal hernia is present. Musculoskeletal: There is infiltrative changes within the right inguinal region as well as shotty right inguinal adenopathy which may be posttraumatic or inflammatory in nature. Healed  left inferior and superior pubic rami fractures are noted. Degenerative changes are seen within the lumbar spine. No acute bone abnormality. Review of the MIP images confirms the above findings. IMPRESSION: 1. No pulmonary embolism. No acute intrathoracic pathology identified. 2. Extensive multi-vessel coronary artery calcification. 3. Mild calcification of the aortic valve leaflets. Echocardiography may be helpful to assess the degree of valvular dysfunction. 4. Small epigastric ventral hernia containing a single loop of unremarkable mid transverse colon as well as small amount of mesenteric fat. Additional small bilobed left parasagittal ventral hernia containing mesenteric fat as well as a tiny umbilical hernia containing mesenteric fat. Small fat containing right inguinal hernia. 5. Infiltrative changes within the right inguinal region as well as shotty right inguinal adenopathy which may be posttraumatic or inflammatory in nature. Correlation with clinical examination is recommended. 6. Extensive atherosclerotic calcification within the right common femoral artery resulting in probable near occlusion of the right common femoral artery. If indicated, this would be better assessed with CT arteriography. Aortic Atherosclerosis (ICD10-I70.0). Electronically Signed   By: Fidela Salisbury M.D.   On: 04/09/2022 21:46   DG Chest Port 1 View  Result Date: 04/09/2022 CLINICAL DATA:  Short of breath, fell EXAM: PORTABLE CHEST 1 VIEW COMPARISON:  02/18/2021 FINDINGS: Single frontal view of the chest demonstrates stable dual lead cardiac pacer overlying left chest, and nerve stimulator overlying right chest. Cardiac silhouette is unremarkable. There is increased central vascular congestion without airspace disease, effusion, or pneumothorax. No acute bony abnormality. IMPRESSION: 1. Central vascular congestion.  No acute airspace disease. Electronically Signed   By: Randa Ngo M.D.   On: 04/09/2022 17:50    Pending  Labs Unresulted Labs (From admission, onward)     Start     Ordered   04/11/22 0500  Procalcitonin  Daily,   R      04/10/22 1444   04/11/22 0500  CBC with Differential/Platelet  Daily,   R      04/10/22 1446   04/11/22 6789  Basic metabolic panel  Daily,   R      04/10/22 1446   04/10/22 1333  Culture, blood (Routine X 2) w Reflex to ID Panel  BLOOD CULTURE X 2,   R      04/10/22 1332            Vitals/Pain Today's Vitals   04/11/22 0805 04/11/22 1134 04/11/22 1230 04/11/22 1300  BP: 107/66 (!) 126/55 (!) 134/91 138/70  Pulse: (!) 121 83 84 92  Resp: (!) 22 (!) 23 (!) 23 (!) 21  Temp:  98.3 F (36.8 C)    TempSrc:  Oral    SpO2: 96% 96% 96% 96%  Weight:      Height:      PainSc:        Isolation Precautions No active isolations  Medications Medications  enoxaparin (LOVENOX) injection 40 mg (40 mg Subcutaneous Given 04/11/22 1217)  acetaminophen (TYLENOL) tablet 650 mg (has no administration in time range)    Or  acetaminophen (TYLENOL) suppository 650 mg (has no administration in time range)  ondansetron (ZOFRAN) tablet 4 mg (has no administration in time range)    Or  ondansetron (ZOFRAN) injection 4 mg (has no administration in time range)  atorvastatin (LIPITOR) tablet 40 mg (40 mg Oral Given 04/11/22 1219)  aspirin EC tablet 81 mg (81 mg Oral Given 04/11/22 1218)  tamsulosin (FLOMAX) capsule 0.4 mg (0.4 mg Oral Given 04/10/22 2155)  timolol (TIMOPTIC) 0.5 % ophthalmic solution 1 drop (1 drop Right Eye Given 04/11/22 1219)  QUEtiapine (SEROQUEL) tablet 300 mg (300 mg Oral Given 04/10/22 2155)  pantoprazole (PROTONIX) EC tablet 40 mg (40 mg Oral Given 04/11/22 1218)  melatonin tablet 10 mg (10 mg Oral Given 04/10/22 2213)  latanoprost (XALATAN) 0.005 % ophthalmic solution 1 drop (1 drop Right Eye Given 04/11/22 1222)  gabapentin (NEURONTIN) capsule 300 mg (300 mg Oral Given 04/11/22 1218)  divalproex (DEPAKOTE) DR tablet 1,000 mg (1,000 mg Oral Given 04/10/22  2207)  dorzolamide (TRUSOPT) 2 % ophthalmic solution 1 drop (1 drop Right Eye Given 04/11/22 1222)  mometasone-formoterol (DULERA) 100-5 MCG/ACT inhaler 2 puff (2 puffs Inhalation Given 04/11/22 0807)  albuterol (PROVENTIL) (2.5 MG/3ML) 0.083% nebulizer solution 2.5 mg (has no administration in time range)  penicillin G potassium 12 Million Units in dextrose 5 % 500 mL continuous infusion (12 Million Units Intravenous New Bag/Given 04/11/22 0807)  lactated ringers infusion ( Intravenous New Bag/Given 04/11/22 0847)  perflutren lipid microspheres (DEFINITY) IV suspension (2 mLs Intravenous Given 04/11/22 0950)  sodium chloride 0.9 % bolus 500 mL (0 mLs Intravenous Stopped 04/09/22 1858)  ondansetron (ZOFRAN) injection 4 mg (4 mg Intravenous Given 04/09/22 1747)  acetaminophen (TYLENOL) tablet 650 mg (650 mg Oral Given 04/09/22 1836)  sodium chloride 0.9 % bolus 1,000 mL (0 mLs Intravenous Stopped 04/09/22 2127)  ceFEPIme (MAXIPIME) 2 g in sodium chloride 0.9 % 100 mL IVPB (0 g Intravenous Stopped 04/09/22 2040)  metroNIDAZOLE (FLAGYL) IVPB 500 mg (0 mg Intravenous Stopped 04/09/22 2006)  vancomycin (VANCOCIN) IVPB 1000 mg/200 mL premix (0 mg Intravenous Stopped 04/09/22 2137)    Followed by  vancomycin (VANCOCIN) IVPB 1000 mg/200 mL premix (0 mg Intravenous Stopped 04/09/22 2241)  iohexol (OMNIPAQUE) 350 MG/ML injection 75 mL (75 mLs Intravenous Contrast Given 04/09/22 2128)    Mobility walks with device High fall risk   Focused Assessments Pulmonary Assessment Handoff:  Lung sounds: Bilateral Breath Sounds: Clear L Breath Sounds: Clear R Breath Sounds: Clear O2 Device: Room Air O2 Flow Rate (L/min): 2 L/min    R Recommendations: See Admitting Provider Note  Report given to:

## 2022-04-11 NOTE — ED Notes (Signed)
MD stated to not draw any labs until 5am on 04/11/2022.

## 2022-04-11 NOTE — Progress Notes (Signed)
  Echocardiogram 2D Echocardiogram has been performed.  Douglas Perkins 04/11/2022, 9:49 AM

## 2022-04-12 ENCOUNTER — Encounter (HOSPITAL_COMMUNITY): Payer: TRICARE For Life (TFL)

## 2022-04-12 ENCOUNTER — Inpatient Hospital Stay (HOSPITAL_COMMUNITY): Payer: Medicare Other

## 2022-04-12 DIAGNOSIS — R8271 Bacteriuria: Secondary | ICD-10-CM | POA: Diagnosis not present

## 2022-04-12 DIAGNOSIS — C4A9 Merkel cell carcinoma, unspecified: Secondary | ICD-10-CM | POA: Diagnosis not present

## 2022-04-12 DIAGNOSIS — T827XXA Infection and inflammatory reaction due to other cardiac and vascular devices, implants and grafts, initial encounter: Secondary | ICD-10-CM

## 2022-04-12 DIAGNOSIS — R7881 Bacteremia: Secondary | ICD-10-CM | POA: Diagnosis not present

## 2022-04-12 DIAGNOSIS — B955 Unspecified streptococcus as the cause of diseases classified elsewhere: Secondary | ICD-10-CM | POA: Diagnosis not present

## 2022-04-12 DIAGNOSIS — A401 Sepsis due to streptococcus, group B: Secondary | ICD-10-CM | POA: Diagnosis not present

## 2022-04-12 LAB — CULTURE, BLOOD (ROUTINE X 2)
Special Requests: ADEQUATE
Special Requests: ADEQUATE

## 2022-04-12 LAB — CBC WITH DIFFERENTIAL/PLATELET
Abs Immature Granulocytes: 0.05 10*3/uL (ref 0.00–0.07)
Basophils Absolute: 0 10*3/uL (ref 0.0–0.1)
Basophils Relative: 0 %
Eosinophils Absolute: 0.1 10*3/uL (ref 0.0–0.5)
Eosinophils Relative: 1 %
HCT: 32.3 % — ABNORMAL LOW (ref 39.0–52.0)
Hemoglobin: 11.2 g/dL — ABNORMAL LOW (ref 13.0–17.0)
Immature Granulocytes: 0 %
Lymphocytes Relative: 13 %
Lymphs Abs: 1.6 10*3/uL (ref 0.7–4.0)
MCH: 31.6 pg (ref 26.0–34.0)
MCHC: 34.7 g/dL (ref 30.0–36.0)
MCV: 91.2 fL (ref 80.0–100.0)
Monocytes Absolute: 1.4 10*3/uL — ABNORMAL HIGH (ref 0.1–1.0)
Monocytes Relative: 12 %
Neutro Abs: 9.3 10*3/uL — ABNORMAL HIGH (ref 1.7–7.7)
Neutrophils Relative %: 74 %
Platelets: 196 10*3/uL (ref 150–400)
RBC: 3.54 MIL/uL — ABNORMAL LOW (ref 4.22–5.81)
RDW: 12.7 % (ref 11.5–15.5)
WBC: 12.5 10*3/uL — ABNORMAL HIGH (ref 4.0–10.5)
nRBC: 0 % (ref 0.0–0.2)

## 2022-04-12 LAB — BASIC METABOLIC PANEL
Anion gap: 9 (ref 5–15)
BUN: 9 mg/dL (ref 8–23)
CO2: 26 mmol/L (ref 22–32)
Calcium: 8.4 mg/dL — ABNORMAL LOW (ref 8.9–10.3)
Chloride: 99 mmol/L (ref 98–111)
Creatinine, Ser: 0.49 mg/dL — ABNORMAL LOW (ref 0.61–1.24)
GFR, Estimated: >60 mL/min (ref >60)
Glucose, Bld: 128 mg/dL — ABNORMAL HIGH (ref 70–99)
Potassium: 4 mmol/L (ref 3.5–5.1)
Sodium: 134 mmol/L — ABNORMAL LOW (ref 135–145)

## 2022-04-12 LAB — PROCALCITONIN: Procalcitonin: 6.26 ng/mL

## 2022-04-12 NOTE — Progress Notes (Signed)
Electrophysiology Rounding Note  Patient Name: Douglas Perkins. Date of Encounter: 04/12/2022  Primary Cardiologist: None Electrophysiologist: New   Subjective   No acute events overnight. Continues to wish to avoid extraction if possible.  Inpatient Medications    Scheduled Meds:  aspirin EC  81 mg Oral Daily   atorvastatin  40 mg Oral Daily   divalproex  1,000 mg Oral QHS   dorzolamide  1 drop Right Eye BID   enoxaparin (LOVENOX) injection  40 mg Subcutaneous Q24H   gabapentin  300 mg Oral BID   latanoprost  1 drop Right Eye BID   melatonin  10 mg Oral QHS   mometasone-formoterol  2 puff Inhalation BID   pantoprazole  40 mg Oral BID   QUEtiapine  300 mg Oral QHS   tamsulosin  0.4 mg Oral QHS   timolol  1 drop Right Eye BID   Continuous Infusions:  penicillin G potassium 12 Million Units in dextrose 5 % 500 mL continuous infusion 12 Million Units (04/12/22 0230)   PRN Meds: acetaminophen **OR** acetaminophen, albuterol, ondansetron **OR** ondansetron (ZOFRAN) IV   Vital Signs    Vitals:   04/11/22 2050 04/11/22 2351 04/12/22 0000 04/12/22 0748  BP: (!) 152/75 (!) 144/66    Pulse: 85 (!) 101 (!) 101   Resp: 20 (!) 25 20   Temp: 98.2 F (36.8 C) 98.5 F (36.9 C)    TempSrc: Oral Oral    SpO2: 98% 97%  95%  Weight:      Height:        Intake/Output Summary (Last 24 hours) at 04/12/2022 0844 Last data filed at 04/12/2022 0725 Gross per 24 hour  Intake --  Output 4350 ml  Net -4350 ml   Filed Weights   04/09/22 1727 04/11/22 1515  Weight: 97.5 kg 127.4 kg    Physical Exam    GEN- The patient is well appearing, alert and oriented x 3 today.   HEENT- No gross abnormality.  Lungs- Clear to ausculation bilaterally, normal work of breathing Heart- Regular rate and rhythm, no murmurs, rubs or gallops GI- soft, NT, ND, + BS Extremities- no clubbing or cyanosis. No edema Neuro- No obvious focal abnormality.   Labs    CBC Recent Labs     04/11/22 0755 04/12/22 0452  WBC 21.1* 12.5*  NEUTROABS 17.2* 9.3*  HGB 11.2* 11.2*  HCT 33.3* 32.3*  MCV 95.4 91.2  PLT 202 829   Basic Metabolic Panel Recent Labs    04/11/22 0755 04/12/22 0452  NA 135 134*  K 3.9 4.0  CL 99 99  CO2 25 26  GLUCOSE 120* 128*  BUN 13 9  CREATININE 0.58* 0.49*  CALCIUM 8.0* 8.4*   Liver Function Tests Recent Labs    04/09/22 1737 04/10/22 0500  AST 33 56*  ALT 26 26  ALKPHOS 66 52  BILITOT 0.4 0.5  PROT 7.3 6.4*  ALBUMIN 3.5 2.8*   Recent Labs    04/09/22 1737  LIPASE 39   Cardiac Enzymes Recent Labs    04/09/22 2010  CKTOTAL 847*     Telemetry    NSR/ST 80-100s mostly (personally reviewed)  Radiology    DG Chest Port 1 View  Result Date: 04/12/2022 CLINICAL DATA:  Shortness of breath EXAM: PORTABLE CHEST 1 VIEW COMPARISON:  04/09/2022 FINDINGS: Left-sided implanted cardiac device and right-sided nerve stimulator remain in place. Stable cardiomediastinal contours. No focal airspace consolidation, pleural effusion, or pneumothorax. IMPRESSION: Stabled chest radiograph.  No acute cardiopulmonary findings. Electronically Signed   By: Davina Poke D.O.   On: 04/12/2022 08:13   ECHOCARDIOGRAM COMPLETE  Result Date: 04/11/2022    ECHOCARDIOGRAM REPORT   Patient Name:   Douglas Perkins. Date of Exam: 04/11/2022 Medical Rec #:  [id number]           Height:       72.0 in Accession #:    [id number]          Weight:       214.9 lb Date of Birth:  07-15-1945           BSA:          2.197 m Patient Age:    76 years            BP:           128/63 mmHg Patient Gender: M                   HR:           76 bpm. Exam Location:  Inpatient Procedure: 2D Echo, Cardiac Doppler, Color Doppler and Intracardiac            Opacification Agent Indications:    Bacteremia  History:        Patient has prior history of Echocardiogram examinations, most                 recent 05/22/2017. CAD, Abnormal ECG and Pacemaker,                  Arrythmias:Bradycardia, Signs/Symptoms:Chest Pain, Bacteremia,                 Syncope and Dyspnea; Risk Factors:Hypertension, Sleep Apnea and                 Dyslipidemia. Cancer.  Sonographer:    Roseanna Rainbow RDCS Referring Phys: [id number] CORNELIUS N VAN DAM  Sonographer Comments: Technically difficult study due to poor echo windows, suboptimal parasternal window, suboptimal apical window, suboptimal subcostal window and patient is obese. Image acquisition challenging due to patient body habitus. Difficult study, patient moving and uncomfortable. IMPRESSIONS  1. The aortic valve is abnormal; trileaflet aortic valve with mild calcification. In the PLAX, cannot exclude echodensity on the ventricular surfance of the aortic valve. No AI in this region. There is mild calcification of the aortic valve. There is mild thickening of the aortic valve. Aortic valve regurgitation is not visualized.  2. Left ventricular ejection fraction, by estimation, is 60 to 65%. The left ventricle has normal function. The left ventricle has no regional wall motion abnormalities. There is moderate concentric left ventricular hypertrophy. Left ventricular diastolic parameters are indeterminate.  3. Right ventricular systolic function is hyperdynamic. The right ventricular size is normal. Tricuspid regurgitation signal is inadequate for assessing PA pressure.  4. The mitral valve is normal in structure. No evidence of mitral valve regurgitation. No evidence of mitral stenosis.  5. The inferior vena cava is normal in size with greater than 50% respiratory variability, suggesting right atrial pressure of 3 mmHg. Comparison(s): No prior Echocardiogram. Conclusion(s)/Recommendation(s): In the setting of bacteremia, consider TEE. FINDINGS  Left Ventricle: Left ventricular ejection fraction, by estimation, is 60 to 65%. The left ventricle has normal function. The left ventricle has no regional wall motion abnormalities. Definity contrast agent was given  IV to delineate the left ventricular  endocardial borders. The left ventricular internal cavity size was normal in size. There  is moderate concentric left ventricular hypertrophy. Left ventricular diastolic parameters are indeterminate. Right Ventricle: The right ventricular size is normal. No increase in right ventricular wall thickness. Right ventricular systolic function is hyperdynamic. Tricuspid regurgitation signal is inadequate for assessing PA pressure. Left Atrium: Left atrial size was normal in size. Right Atrium: Right atrial size was normal in size. Pericardium: There is no evidence of pericardial effusion. Mitral Valve: The mitral valve is normal in structure. No evidence of mitral valve regurgitation. No evidence of mitral valve stenosis. Tricuspid Valve: The tricuspid valve is normal in structure. Tricuspid valve regurgitation is not demonstrated. No evidence of tricuspid stenosis. Aortic Valve: The aortic valve is abnormal. There is mild calcification of the aortic valve. There is mild thickening of the aortic valve. Aortic valve regurgitation is not visualized. Pulmonic Valve: The pulmonic valve was not well visualized. Pulmonic valve regurgitation is trivial. Aorta: The aortic root and ascending aorta are structurally normal, with no evidence of dilitation. Venous: The inferior vena cava is normal in size with greater than 50% respiratory variability, suggesting right atrial pressure of 3 mmHg. IAS/Shunts: No atrial level shunt detected by color flow Doppler.  LEFT VENTRICLE PLAX 2D LVIDd:         5.40 cm LVIDs:         3.70 cm LV PW:         1.30 cm LV IVS:        1.20 cm LVOT diam:     2.50 cm LV SV:         67 LV SV Index:   31 LVOT Area:     4.91 cm  LV Volumes (MOD) LV vol d, MOD A2C: 217.0 ml LV vol d, MOD A4C: 171.0 ml LV vol s, MOD A2C: 88.8 ml LV vol s, MOD A4C: 73.2 ml LV SV MOD A2C:     128.2 ml LV SV MOD A4C:     171.0 ml LV SV MOD BP:      114.3 ml RIGHT VENTRICLE             IVC RV S  prime:     12.60 cm/s  IVC diam: 1.90 cm TAPSE (M-mode): 2.8 cm LEFT ATRIUM           Index        RIGHT ATRIUM           Index LA diam:      3.70 cm 1.68 cm/m   RA Area:     16.00 cm LA Vol (A2C): 37.8 ml 17.21 ml/m  RA Volume:   40.10 ml  18.25 ml/m LA Vol (A4C): 31.2 ml 14.20 ml/m  AORTIC VALVE LVOT Vmax:   100.53 cm/s LVOT Vmean:  66.033 cm/s LVOT VTI:    0.137 m  AORTA Ao Root diam: 3.60 cm Ao Asc diam:  3.20 cm MITRAL VALVE MV Area (PHT): 4.76 cm     SHUNTS MV Decel Time: 159 msec     Systemic VTI:  0.14 m MV E velocity: 119.33 cm/s  Systemic Diam: 2.50 cm Rudean Haskell MD Electronically signed by Rudean Haskell MD Signature Date/Time: 04/11/2022/10:48:29 AM    Final     Patient Profile     Mr. Vences is a 76 year old man who I am seeing today for an evaluation of strep bacteremia and possible CIED associated infection at the request of Dr. Drucilla Schmidt. 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 The patient presented to the hospital after being found down. He was found to be febrile with a leukocytosis and tachycardia. He is tachypneic and had an elevated lactic acid. He was started on antibiotics after blood cultures were drawn. Blood cultures have returned positive for group B strep. No clear sources of infection.   Assessment & Plan    1.  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  Pt is very anxious about the idea of PPM extraction. He verbalizes understanding that while it is the best option, he wishes to avoid.  Picture additionally complicated by R sided inspire device.    2. Severe sepsis 3. Group B streptococcal bacteremia Echo 04/11/2022 with EF 60-65%, cannot exclude aortic valve echodensity. TEE recommended.  Meets indication for device extraction, but currently refusing.  Prefers lifelong suppressive antibiotics, though verbalizes understanding that this is not the most appropriate option.   For questions or updates, please contact [facility] Please consult [email or web address] for contact info under Cardiology/STEMI.  Signed, Shirley Friar, PA-C  04/12/2022, 8:44 AM

## 2022-04-12 NOTE — Progress Notes (Signed)
PROGRESS NOTE  Douglas Perkins. NWG:[id number] DOB: Mar 29, 1946 DOA: 04/09/2022 PCP: [name], MD  HPI/Recap of past 24 hours:  Douglas Perkins. is a 76 y.o. 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 Pt presents to ED from [place].  Was found on the ground after an unwitnessed fall. Was nauseous and diaphoretic and vomited once. Found to be altered initially and sats were around 90% so placed on nonrebreather. In the ED, noted to be septic, started on broad-spectrum antibiotics. Patient admitted for further management.    Today, patient still appears somewhat lethargic, but overall slowly improving.    Assessment/Plan: Principal Problem:   Group B streptococcal bacteriuria Active Problems:   SIRS (systemic inflammatory response syndrome) (HCC)   Benign essential hypertension   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HCC)   Sepsis (HCC)   Severe sepsis likely 2/2 Group B streptococcal bacteremia On admission leukocytosis, febrile, tachycardic, tachypneic, lactic acid of 3.5 Currently afebrile, with resolving leukocytosis, lactic acid resolved Procalcitonin elevated, trending down BC X 2 growing group B streptococcus, repeat pending UA negative CTA chest neg for PNA or PE CT A/p: Noted area of inflammation in right groin secondary to prior radiation therapy ID on board, appreciate recs TTE showed EF of 60 to 65%, no regional wall motion abnormality, recommend TEE scheduled for 12/15 (pacemaker) Cardiology and EP consulted, rec lead extraction, but patient refused ID narrowed broad-spectrum antibiotics to penicillin S/P IV fluids, BP stable -Plan for TEE on Friday, but recommendation is for pacemaker with lead extraction regardless, but patient currently refusing that.  As well patient with right-sided inspire device.  Hypertension BP noted to be soft/fluctuating likely 2/2 sepsis Hold home Coreg, amlodipine  pending BP stability  CAD/HLD Mildly elevated troponin-likely demand ischemia Echo noted as above Denies any chest pain Continue aspirin, Lipitor  Merkel cell carcinoma S/p radiation therapy CT showing area of inflammation in right groin RLE swollen with erythema, will order doppler  Obstructive sleep apnea Does have implantable inspire device  Obesity Lifestyle modification advised    Estimated body mass index is 38.09 kg/m as calculated from the following:   Height as of this encounter: 6' (1.829 m).   Weight as of this encounter: 127.4 kg.     Code Status: Full  Family Communication: None at bedside  Disposition Plan: Status is: Inpatient The patient will require care spanning > 2 midnights and should be moved to inpatient because: Level of care      Consultants: ID EP  Procedures: None  Antimicrobials: Penicillin G  DVT prophylaxis: Lovenox   Objective: Vitals:   04/11/22 2050 04/11/22 2351 04/12/22 0000 04/12/22 0748  BP: (!) 152/75 (!) 144/66    Pulse: 85 (!) 101 (!) 101   Resp: 20 (!) 25 20   Temp: 98.2 F (36.8 C) 98.5 F (36.9 C)    TempSrc: Oral Oral    SpO2: 98% 97%  95%  Weight:      Height:        Intake/Output Summary (Last 24 hours) at 04/12/2022 1304 Last data filed at 04/12/2022 0725 Gross per 24 hour  Intake --  Output 4350 ml  Net -4350 ml   Filed Weights   04/09/22 1727 04/11/22 1515  Weight: 97.5 kg 127.4 kg    Exam:  Awake Alert, Oriented X 3, No new F.N deficits, Normal affect Symmetrical Chest wall movement, Good air movement bilaterally, CTAB RRR,No  Gallops,Rubs or new Murmurs, No Parasternal Heave +ve B.Sounds, Abd Soft, No tenderness, No rebound - guarding or rigidity. No Cyanosis, Clubbing or edema, right lower extremity with chronic changes from previous radiation.    Data Reviewed: CBC: Recent Labs  Lab 04/09/22 1737 04/09/22 1748 04/10/22 0500 04/11/22 0755 04/12/22 0452  WBC 20.4*  --   31.7* 21.1* 12.5*  NEUTROABS 17.5*  --   --  17.2* 9.3*  HGB 13.6 13.6 12.0* 11.2* 11.2*  HCT 40.3 40.0 35.0* 33.3* 32.3*  MCV 92.9  --  93.3 95.4 91.2  PLT 284  --  226 202 638   Basic Metabolic Panel: Recent Labs  Lab 04/09/22 1737 04/09/22 1748 04/10/22 0500 04/11/22 0755 04/12/22 0452  NA 134* 135 134* 135 134*  K 3.6 3.7 3.8 3.9 4.0  CL 100 101 100 99 99  CO2 20*  --  '24 25 26  '$ GLUCOSE 120* 125* 150* 120* 128*  BUN '23 23 17 13 9  '$ CREATININE 1.24 1.00 0.88 0.58* 0.49*  CALCIUM 8.9  --  7.9* 8.0* 8.4*   GFR: Estimated Creatinine Clearance: 104.9 mL/min (A) (by C-G formula based on SCr of 0.49 mg/dL (L)). Liver Function Tests: Recent Labs  Lab 04/09/22 1737 04/10/22 0500  AST 33 56*  ALT 26 26  ALKPHOS 66 52  BILITOT 0.4 0.5  PROT 7.3 6.4*  ALBUMIN 3.5 2.8*   Recent Labs  Lab 04/09/22 1737  LIPASE 39   No results for input(s): "AMMONIA" in the last 168 hours. Coagulation Profile: Recent Labs  Lab 04/09/22 1737 04/10/22 0500  INR 1.1 1.2   Cardiac Enzymes: Recent Labs  Lab 04/09/22 2010  CKTOTAL 847*   BNP (last 3 results) No results for input(s): "PROBNP" in the last 8760 hours. HbA1C: No results for input(s): "HGBA1C" in the last 72 hours. CBG: No results for input(s): "GLUCAP" in the last 168 hours. Lipid Profile: No results for input(s): "CHOL", "HDL", "LDLCALC", "TRIG", "CHOLHDL", "LDLDIRECT" in the last 72 hours. Thyroid Function Tests: No results for input(s): "TSH", "T4TOTAL", "FREET4", "T3FREE", "THYROIDAB" in the last 72 hours. Anemia Panel: No results for input(s): "VITAMINB12", "FOLATE", "FERRITIN", "TIBC", "IRON", "RETICCTPCT" in the last 72 hours. Urine analysis:    Component Value Date/Time   COLORURINE YELLOW 04/09/2022 2135   APPEARANCEUR HAZY (A) 04/09/2022 2135   APPEARANCEUR Clear 05/24/2015 1328   LABSPEC 1.017 04/09/2022 2135   PHURINE 6.0 04/09/2022 2135   GLUCOSEU NEGATIVE 04/09/2022 2135   HGBUR SMALL (A)  04/09/2022 2135   BILIRUBINUR NEGATIVE 04/09/2022 2135   BILIRUBINUR Negative 05/24/2015 [place] 04/09/2022 2135   PROTEINUR 30 (A) 04/09/2022 2135   NITRITE NEGATIVE 04/09/2022 2135   LEUKOCYTESUR NEGATIVE 04/09/2022 2135   Sepsis Labs: '@LABRCNTIP'$ (procalcitonin:4,lacticidven:4)  ) Recent Results (from the past 240 hour(s))  Resp panel by RT-PCR (RSV, Flu A&B, Covid) Anterior Nasal Swab     Status: None   Collection Time: 04/09/22  5:18 PM   Specimen: Anterior Nasal Swab  Result Value Ref Range Status   SARS Coronavirus 2 by RT PCR NEGATIVE NEGATIVE Final    Comment: (NOTE) SARS-CoV-2 target nucleic acids are NOT DETECTED.  The SARS-CoV-2 RNA is generally detectable in upper respiratory specimens during the acute phase of infection. The lowest concentration of SARS-CoV-2 viral copies this assay can detect is 138 copies/mL. A negative result does not preclude SARS-Cov-2 infection and should not be used as the sole basis for treatment or other patient management decisions. A negative  result may occur with  improper specimen collection/handling, submission of specimen other than nasopharyngeal swab, presence of viral mutation(s) within the areas targeted by this assay, and inadequate number of viral copies(<138 copies/mL). A negative result must be combined with clinical observations, patient history, and epidemiological information. The expected result is Negative.  Fact Sheet for Patients:  EntrepreneurPulse.com.au  Fact Sheet for Healthcare Providers:  IncredibleEmployment.be  This test is no t yet approved or cleared by the [place] FDA and  has been authorized for detection and/or diagnosis of SARS-CoV-2 by FDA under an Emergency Use Authorization (EUA). This EUA will remain  in effect (meaning this test can be used) for the duration of the COVID-19 declaration under Section 564(b)(1) of the Act, 21 U.S.C.section  360bbb-3(b)(1), unless the authorization is terminated  or revoked sooner.       Influenza A by PCR NEGATIVE NEGATIVE Final   Influenza B by PCR NEGATIVE NEGATIVE Final    Comment: (NOTE) The Xpert Xpress SARS-CoV-2/FLU/RSV plus assay is intended as an aid in the diagnosis of influenza from Nasopharyngeal swab specimens and should not be used as a sole basis for treatment. Nasal washings and aspirates are unacceptable for Xpert Xpress SARS-CoV-2/FLU/RSV testing.  Fact Sheet for Patients: EntrepreneurPulse.com.au  Fact Sheet for Healthcare Providers: IncredibleEmployment.be  This test is not yet approved or cleared by the [place] FDA and has been authorized for detection and/or diagnosis of SARS-CoV-2 by FDA under an Emergency Use Authorization (EUA). This EUA will remain in effect (meaning this test can be used) for the duration of the COVID-19 declaration under Section 564(b)(1) of the Act, 21 U.S.C. section 360bbb-3(b)(1), unless the authorization is terminated or revoked.     Resp Syncytial Virus by PCR NEGATIVE NEGATIVE Final    Comment: (NOTE) Fact Sheet for Patients: EntrepreneurPulse.com.au  Fact Sheet for Healthcare Providers: IncredibleEmployment.be  This test is not yet approved or cleared by the [place] FDA and has been authorized for detection and/or diagnosis of SARS-CoV-2 by FDA under an Emergency Use Authorization (EUA). This EUA will remain in effect (meaning this test can be used) for the duration of the COVID-19 declaration under Section 564(b)(1) of the Act, 21 U.S.C. section 360bbb-3(b)(1), unless the authorization is terminated or revoked.  Performed at [facility], [place] [address] [place], [place] [address]   Culture, blood (routine x 2)     Status: Abnormal (Preliminary result)   Collection Time: 04/09/22  5:24 PM   Specimen: BLOOD RIGHT HAND  Result Value  Ref Range Status   Specimen Description BLOOD RIGHT HAND  Final   Special Requests   Final    BOTTLES DRAWN AEROBIC AND ANAEROBIC Blood Culture adequate volume   Culture  Setup Time   Final    GRAM POSITIVE COCCI IN CHAINS IN BOTH AEROBIC AND ANAEROBIC BOTTLES CRITICAL RESULT CALLED TO, READ BACK BY AND VERIFIED WITH: PHARMD ELIZABETH M 1011 [id number] FCP    Culture (A)  Final    GROUP B STREP(S.AGALACTIAE)ISOLATED SUSCEPTIBILITIES TO FOLLOW Performed at [facility], [place] [address] [place], [place] [address]    Report Status PENDING  Incomplete  Blood Culture ID Panel (Reflexed)     Status: Abnormal   Collection Time: 04/09/22  5:24 PM  Result Value Ref Range Status   Enterococcus faecalis NOT DETECTED NOT DETECTED Final   Enterococcus Faecium NOT DETECTED NOT DETECTED Final   Listeria monocytogenes NOT DETECTED NOT DETECTED Final   Staphylococcus species NOT DETECTED  NOT DETECTED Final   Staphylococcus aureus (BCID) NOT DETECTED NOT DETECTED Final   Staphylococcus epidermidis NOT DETECTED NOT DETECTED Final   Staphylococcus lugdunensis NOT DETECTED NOT DETECTED Final   Streptococcus species DETECTED (A) NOT DETECTED Final    Comment: CRITICAL RESULT CALLED TO, READ BACK BY AND VERIFIED WITH: PHARMD ELIZABETH M 1011 [id number] FCP    Streptococcus agalactiae DETECTED (A) NOT DETECTED Final    Comment: CRITICAL RESULT CALLED TO, READ BACK BY AND VERIFIED WITH: PHARMD ELIZABETH M 1011 [id number] FCP    Streptococcus pneumoniae NOT DETECTED NOT DETECTED Final   Streptococcus pyogenes NOT DETECTED NOT DETECTED Final   A.calcoaceticus-baumannii NOT DETECTED NOT DETECTED Final   Bacteroides fragilis NOT DETECTED NOT DETECTED Final   Enterobacterales NOT DETECTED NOT DETECTED Final   Enterobacter cloacae complex NOT DETECTED NOT DETECTED Final   Escherichia coli NOT DETECTED NOT DETECTED Final   Klebsiella aerogenes NOT DETECTED NOT DETECTED Final   Klebsiella oxytoca NOT DETECTED NOT  DETECTED Final   Klebsiella pneumoniae NOT DETECTED NOT DETECTED Final   Proteus species NOT DETECTED NOT DETECTED Final   Salmonella species NOT DETECTED NOT DETECTED Final   Serratia marcescens NOT DETECTED NOT DETECTED Final   Haemophilus influenzae NOT DETECTED NOT DETECTED Final   Neisseria meningitidis NOT DETECTED NOT DETECTED Final   Pseudomonas aeruginosa NOT DETECTED NOT DETECTED Final   Stenotrophomonas maltophilia NOT DETECTED NOT DETECTED Final   Candida albicans NOT DETECTED NOT DETECTED Final   Candida auris NOT DETECTED NOT DETECTED Final   Candida glabrata NOT DETECTED NOT DETECTED Final   Candida krusei NOT DETECTED NOT DETECTED Final   Candida parapsilosis NOT DETECTED NOT DETECTED Final   Candida tropicalis NOT DETECTED NOT DETECTED Final   Cryptococcus neoformans/gattii NOT DETECTED NOT DETECTED Final    Comment: Performed at [facility], [address], [place], [place] [address]  Culture, blood (routine x 2)     Status: Abnormal (Preliminary result)   Collection Time: 04/09/22  5:37 PM   Specimen: BLOOD LEFT HAND  Result Value Ref Range Status   Specimen Description BLOOD LEFT HAND  Final   Special Requests   Final    BOTTLES DRAWN AEROBIC AND ANAEROBIC Blood Culture adequate volume   Culture  Setup Time   Final    GRAM POSITIVE COCCI IN CHAINS IN BOTH AEROBIC AND ANAEROBIC BOTTLES CRITICAL VALUE NOTED.  VALUE IS CONSISTENT WITH PREVIOUSLY REPORTED AND CALLED VALUE. Performed at [facility], [place] [address], [place], [place] [address]    Culture GROUP B STREP(S.AGALACTIAE)ISOLATED (A)  Final   Report Status PENDING  Incomplete  MRSA Next Gen by PCR, Nasal     Status: None   Collection Time: 04/10/22  6:25 AM   Specimen: Nasal Mucosa; Nasal Swab  Result Value Ref Range Status   MRSA by PCR Next Gen NOT DETECTED NOT DETECTED Final    Comment: (NOTE) The GeneXpert MRSA Assay (FDA approved for NASAL specimens only), is one component of a  comprehensive MRSA colonization surveillance program. It is not intended to diagnose MRSA infection nor to guide or monitor treatment for MRSA infections. Test performance is not FDA approved in patients less than 46 years old. Performed at [facility], [place] [address], [place],  [address]   Culture, blood (Routine X 2) w Reflex to ID Panel     Status: None (Preliminary result)   Collection Time: 04/10/22  1:33 PM   Specimen: BLOOD RIGHT  HAND  Result Value Ref Range Status   Specimen Description BLOOD RIGHT HAND  Final   Special Requests   Final    BOTTLES DRAWN AEROBIC AND ANAEROBIC Blood Culture adequate volume   Culture   Final    NO GROWTH < 12 HOURS Performed at [facility], [address] [place], [place] [address]    Report Status PENDING  Incomplete      Studies: DG Chest Port 1 View  Result Date: 04/12/2022 CLINICAL DATA:  Shortness of breath EXAM: PORTABLE CHEST 1 VIEW COMPARISON:  04/09/2022 FINDINGS: Left-sided implanted cardiac device and right-sided nerve stimulator remain in place. Stable cardiomediastinal contours. No focal airspace consolidation, pleural effusion, or pneumothorax. IMPRESSION: Stabled chest radiograph.  No acute cardiopulmonary findings. Electronically Signed   By: Davina Poke D.O.   On: 04/12/2022 08:13    Scheduled Meds:  aspirin EC  81 mg Oral Daily   atorvastatin  40 mg Oral Daily   divalproex  1,000 mg Oral QHS   dorzolamide  1 drop Right Eye BID   enoxaparin (LOVENOX) injection  40 mg Subcutaneous Q24H   gabapentin  300 mg Oral BID   latanoprost  1 drop Right Eye BID   melatonin  10 mg Oral QHS   mometasone-formoterol  2 puff Inhalation BID   pantoprazole  40 mg Oral BID   QUEtiapine  300 mg Oral QHS   tamsulosin  0.4 mg Oral QHS   timolol  1 drop Right Eye BID    Continuous Infusions:  penicillin G potassium 12 Million Units in dextrose 5 % 500 mL continuous infusion 12 Million Units (04/12/22 0230)      LOS: 2 days     Phillips Climes, MD [facility]  If 7PM-7AM, please contact night-coverage [email or web address] 04/12/2022, 1:04 PM

## 2022-04-12 NOTE — Progress Notes (Signed)
  Transition of Care [facility]) Screening Note   Patient Details  Name: Douglas Perkins. Date of Birth: Jun 15, 1945   Transition of Care [facility]) CM/SW Contact:    Cyndi Bender, RN Phone Number: 04/12/2022, 8:44 AM    Transition of Care Department [facility]) has reviewed patient and no TOC needs have been identified at this time. We will continue to monitor patient advancement through interdisciplinary progression rounds. If new patient transition needs arise, please place a TOC consult.

## 2022-04-12 NOTE — Plan of Care (Signed)

## 2022-04-12 NOTE — Plan of Care (Signed)

## 2022-04-12 NOTE — Progress Notes (Signed)
Subjective:  No new complaints   Antibiotics:  Anti-infectives (From admission, onward)    Start     Dose/Rate Route Frequency Ordered Stop   04/10/22 2200  vancomycin (VANCOREADY) IVPB 2000 mg/400 mL  Status:  Discontinued        2,000 mg 200 mL/hr over 120 Minutes Intravenous Every 24 hours 04/09/22 2034 04/10/22 1020   04/10/22 1400  penicillin G potassium 12 Million Units in dextrose 5 % 500 mL continuous infusion        12 Million Units 41.7 mL/hr over 12 Hours Intravenous Every 12 hours 04/10/22 1020     04/10/22 0915  metroNIDAZOLE (FLAGYL) IVPB 500 mg  Status:  Discontinued        500 mg 100 mL/hr over 60 Minutes Intravenous Every 12 hours 04/10/22 0900 04/10/22 1020   04/10/22 0600  ceFEPIme (MAXIPIME) 2 g in sodium chloride 0.9 % 100 mL IVPB  Status:  Discontinued        2 g 200 mL/hr over 30 Minutes Intravenous Every 8 hours 04/09/22 2034 04/10/22 1020   04/09/22 1900  ceFEPIme (MAXIPIME) 2 g in sodium chloride 0.9 % 100 mL IVPB        2 g 200 mL/hr over 30 Minutes Intravenous  Once 04/09/22 1848 04/09/22 2040   04/09/22 1900  metroNIDAZOLE (FLAGYL) IVPB 500 mg        500 mg 100 mL/hr over 60 Minutes Intravenous  Once 04/09/22 1848 04/09/22 2006   04/09/22 1900  vancomycin (VANCOCIN) IVPB 1000 mg/200 mL premix  Status:  Discontinued        1,000 mg 200 mL/hr over 60 Minutes Intravenous  Once 04/09/22 1848 04/09/22 1850   04/09/22 1900  vancomycin (VANCOCIN) IVPB 1000 mg/200 mL premix       See Hyperspace for full Linked Orders Report.   1,000 mg 200 mL/hr over 60 Minutes Intravenous  Once 04/09/22 1850 04/09/22 2137   04/09/22 1900  vancomycin (VANCOCIN) IVPB 1000 mg/200 mL premix       See Hyperspace for full Linked Orders Report.   1,000 mg 200 mL/hr over 60 Minutes Intravenous  Once 04/09/22 1850 04/09/22 2241       Medications: Scheduled Meds:  aspirin EC  81 mg Oral Daily   atorvastatin  40 mg Oral Daily   divalproex  1,000 mg Oral QHS    dorzolamide  1 drop Right Eye BID   enoxaparin (LOVENOX) injection  40 mg Subcutaneous Q24H   gabapentin  300 mg Oral BID   latanoprost  1 drop Right Eye BID   melatonin  10 mg Oral QHS   mometasone-formoterol  2 puff Inhalation BID   pantoprazole  40 mg Oral BID   QUEtiapine  300 mg Oral QHS   tamsulosin  0.4 mg Oral QHS   timolol  1 drop Right Eye BID   Continuous Infusions:  penicillin G potassium 12 Million Units in dextrose 5 % 500 mL continuous infusion 12 Million Units (04/12/22 1346)   PRN Meds:.acetaminophen **OR** acetaminophen, albuterol, ondansetron **OR** ondansetron (ZOFRAN) IV    Objective: Weight change:   Intake/Output Summary (Last 24 hours) at 04/12/2022 1404 Last data filed at 04/12/2022 0725 Gross per 24 hour  Intake --  Output 4350 ml  Net -4350 ml    Blood pressure (!) 144/66, pulse (!) 101, temperature 98.5 F (36.9 C), temperature source Oral, resp. rate 20, height 6' (1.829 m), weight 127.4 kg, SpO2 95 %.  Temp:  [98.1 F (36.7 C)-98.5 F (36.9 C)] 98.5 F (36.9 C) (12/12 2351) Pulse Rate:  [83-101] 101 (12/13 0000) Resp:  [16-25] 20 (12/13 0000) BP: (144-160)/(66-76) 144/66 (12/12 2351) SpO2:  [95 %-98 %] 95 % (12/13 0748) Weight:  [127.4 kg] 127.4 kg (12/12 1515)  Physical Exam: Physical Exam Constitutional:      Appearance: He is well-developed.  HENT:     Head: Normocephalic and atraumatic.  Eyes:     Conjunctiva/sclera: Conjunctivae normal.  Cardiovascular:     Rate and Rhythm: Normal rate and regular rhythm.     Heart sounds: No murmur heard.    No friction rub. No gallop.  Pulmonary:     Effort: Pulmonary effort is normal. No respiratory distress.     Breath sounds: Normal breath sounds. No stridor. No wheezing.  Abdominal:     General: There is no distension.     Palpations: Abdomen is soft.  Musculoskeletal:        General: Normal range of motion.     Cervical back: Normal range of motion and neck supple.  Skin:     General: Skin is warm and dry.     Findings: No erythema or rash.  Neurological:     General: No focal deficit present.     Mental Status: He is alert and oriented to person, place, and time.  Psychiatric:        Mood and Affect: Mood normal.        Behavior: Behavior normal.        Thought Content: Thought content normal.        Judgment: Judgment normal.      CBC:    BMET Recent Labs    04/11/22 0755 04/12/22 0452  NA 135 134*  K 3.9 4.0  CL 99 99  CO2 25 26  GLUCOSE 120* 128*  BUN 13 9  CREATININE 0.58* 0.49*  CALCIUM 8.0* 8.4*      Liver Panel  Recent Labs    04/09/22 1737 04/10/22 0500  PROT 7.3 6.4*  ALBUMIN 3.5 2.8*  AST 33 56*  ALT 26 26  ALKPHOS 66 52  BILITOT 0.4 0.5        Sedimentation Rate No results for input(s): "ESRSEDRATE" in the last 72 hours. C-Reactive Protein No results for input(s): "CRP" in the last 72 hours.  Micro Results: Recent Results (from the past 720 hour(s))  Resp panel by RT-PCR (RSV, Flu A&B, Covid) Anterior Nasal Swab     Status: None   Collection Time: 04/09/22  5:18 PM   Specimen: Anterior Nasal Swab  Result Value Ref Range Status   SARS Coronavirus 2 by RT PCR NEGATIVE NEGATIVE Final    Comment: (NOTE) SARS-CoV-2 target nucleic acids are NOT DETECTED.  The SARS-CoV-2 RNA is generally detectable in upper respiratory specimens during the acute phase of infection. The lowest concentration of SARS-CoV-2 viral copies this assay can detect is 138 copies/mL. A negative result does not preclude SARS-Cov-2 infection and should not be used as the sole basis for treatment or other patient management decisions. A negative result may occur with  improper specimen collection/handling, submission of specimen other than nasopharyngeal swab, presence of viral mutation(s) within the areas targeted by this assay, and inadequate number of viral copies(<138 copies/mL). A negative result must be combined with clinical  observations, patient history, and epidemiological information. The expected result is Negative.  Fact Sheet for Patients:  EntrepreneurPulse.com.au  Fact Sheet for Healthcare Providers:  IncredibleEmployment.be  This test is no t yet approved or cleared by the [place] and  has been authorized for detection and/or diagnosis of SARS-CoV-2 by FDA under an Emergency Use Authorization (EUA). This EUA will remain  in effect (meaning this test can be used) for the duration of the COVID-19 declaration under Section 564(b)(1) of the Act, 21 U.S.C.section 360bbb-3(b)(1), unless the authorization is terminated  or revoked sooner.       Influenza A by PCR NEGATIVE NEGATIVE Final   Influenza B by PCR NEGATIVE NEGATIVE Final    Comment: (NOTE) The Xpert Xpress SARS-CoV-2/FLU/RSV plus assay is intended as an aid in the diagnosis of influenza from Nasopharyngeal swab specimens and should not be used as a sole basis for treatment. Nasal washings and aspirates are unacceptable for Xpert Xpress SARS-CoV-2/FLU/RSV testing.  Fact Sheet for Patients: EntrepreneurPulse.com.au  Fact Sheet for Healthcare Providers: IncredibleEmployment.be  This test is not yet approved or cleared by the [place] FDA and has been authorized for detection and/or diagnosis of SARS-CoV-2 by FDA under an Emergency Use Authorization (EUA). This EUA will remain in effect (meaning this test can be used) for the duration of the COVID-19 declaration under Section 564(b)(1) of the Act, 21 U.S.C. section 360bbb-3(b)(1), unless the authorization is terminated or revoked.     Resp Syncytial Virus by PCR NEGATIVE NEGATIVE Final    Comment: (NOTE) Fact Sheet for Patients: EntrepreneurPulse.com.au  Fact Sheet for Healthcare Providers: IncredibleEmployment.be  This test is not yet approved or cleared by  the [place] FDA and has been authorized for detection and/or diagnosis of SARS-CoV-2 by FDA under an Emergency Use Authorization (EUA). This EUA will remain in effect (meaning this test can be used) for the duration of the COVID-19 declaration under Section 564(b)(1) of the Act, 21 U.S.C. section 360bbb-3(b)(1), unless the authorization is terminated or revoked.  Performed at [facility], [place] [address], [place], [place] [address]   Culture, blood (routine x 2)     Status: Abnormal   Collection Time: 04/09/22  5:24 PM   Specimen: BLOOD RIGHT HAND  Result Value Ref Range Status   Specimen Description BLOOD RIGHT HAND  Final   Special Requests   Final    BOTTLES DRAWN AEROBIC AND ANAEROBIC Blood Culture adequate volume   Culture  Setup Time   Final    GRAM POSITIVE COCCI IN CHAINS IN BOTH AEROBIC AND ANAEROBIC BOTTLES CRITICAL RESULT CALLED TO, READ BACK BY AND VERIFIED WITH: McSwain W [id number] FCP Performed at [facility], [place] [address] [place], [place] [address]    Culture GROUP B STREP(S.AGALACTIAE)ISOLATED (A)  Final   Report Status 04/12/2022 FINAL  Final   Organism ID, Bacteria GROUP B STREP(S.AGALACTIAE)ISOLATED  Final      Susceptibility   Group b strep(s.agalactiae)isolated - MIC*    CLINDAMYCIN >=1 RESISTANT Resistant     AMPICILLIN <=0.25 SENSITIVE Sensitive     ERYTHROMYCIN >=8 RESISTANT Resistant     VANCOMYCIN 0.5 SENSITIVE Sensitive     CEFTRIAXONE <=0.12 SENSITIVE Sensitive     LEVOFLOXACIN 1 SENSITIVE Sensitive     PENICILLIN <=0.06 SENSITIVE Sensitive     * GROUP B STREP(S.AGALACTIAE)ISOLATED  Blood Culture ID Panel (Reflexed)     Status: Abnormal   Collection Time: 04/09/22  5:24 PM  Result Value Ref Range Status   Enterococcus faecalis NOT DETECTED NOT DETECTED Final   Enterococcus Faecium NOT DETECTED NOT DETECTED Final   Listeria monocytogenes  NOT DETECTED NOT DETECTED Final   Staphylococcus species NOT DETECTED NOT  DETECTED Final   Staphylococcus aureus (BCID) NOT DETECTED NOT DETECTED Final   Staphylococcus epidermidis NOT DETECTED NOT DETECTED Final   Staphylococcus lugdunensis NOT DETECTED NOT DETECTED Final   Streptococcus species DETECTED (A) NOT DETECTED Final    Comment: CRITICAL RESULT CALLED TO, READ BACK BY AND VERIFIED WITH: PHARMD ELIZABETH M [id number] FCP    Streptococcus agalactiae DETECTED (A) NOT DETECTED Final    Comment: CRITICAL RESULT CALLED TO, READ BACK BY AND VERIFIED WITH: PHARMD ELIZABETH M [id number] FCP    Streptococcus pneumoniae NOT DETECTED NOT DETECTED Final   Streptococcus pyogenes NOT DETECTED NOT DETECTED Final   A.calcoaceticus-baumannii NOT DETECTED NOT DETECTED Final   Bacteroides fragilis NOT DETECTED NOT DETECTED Final   Enterobacterales NOT DETECTED NOT DETECTED Final   Enterobacter cloacae complex NOT DETECTED NOT DETECTED Final   Escherichia coli NOT DETECTED NOT DETECTED Final   Klebsiella aerogenes NOT DETECTED NOT DETECTED Final   Klebsiella oxytoca NOT DETECTED NOT DETECTED Final   Klebsiella pneumoniae NOT DETECTED NOT DETECTED Final   Proteus species NOT DETECTED NOT DETECTED Final   Salmonella species NOT DETECTED NOT DETECTED Final   Serratia marcescens NOT DETECTED NOT DETECTED Final   Haemophilus influenzae NOT DETECTED NOT DETECTED Final   Neisseria meningitidis NOT DETECTED NOT DETECTED Final   Pseudomonas aeruginosa NOT DETECTED NOT DETECTED Final   Stenotrophomonas maltophilia NOT DETECTED NOT DETECTED Final   Candida albicans NOT DETECTED NOT DETECTED Final   Candida auris NOT DETECTED NOT DETECTED Final   Candida glabrata NOT DETECTED NOT DETECTED Final   Candida krusei NOT DETECTED NOT DETECTED Final   Candida parapsilosis NOT DETECTED NOT DETECTED Final   Candida tropicalis NOT DETECTED NOT DETECTED Final   Cryptococcus neoformans/gattii NOT DETECTED NOT DETECTED Final    Comment: Performed at [facility], [address], [place], [place] [address]  Culture, blood (routine x 2)     Status: Abnormal   Collection Time: 04/09/22  5:37 PM   Specimen: BLOOD LEFT HAND  Result Value Ref Range Status   Specimen Description BLOOD LEFT HAND  Final   Special Requests   Final    BOTTLES DRAWN AEROBIC AND ANAEROBIC Blood Culture adequate volume   Culture  Setup Time   Final    GRAM POSITIVE COCCI IN CHAINS IN BOTH AEROBIC AND ANAEROBIC BOTTLES CRITICAL VALUE NOTED.  VALUE IS CONSISTENT WITH PREVIOUSLY REPORTED AND CALLED VALUE.    Culture (A)  Final    GROUP B STREP(S.AGALACTIAE)ISOLATED SUSCEPTIBILITIES PERFORMED ON PREVIOUS CULTURE WITHIN THE LAST 5 DAYS. Performed at [facility], [place] [address], [place], [place] [address]    Report Status 04/12/2022 FINAL  Final  MRSA Next Gen by PCR, Nasal     Status: None   Collection Time: 04/10/22  6:25 AM   Specimen: Nasal Mucosa; Nasal Swab  Result Value Ref Range Status   MRSA by PCR Next Gen NOT DETECTED NOT DETECTED Final    Comment: (NOTE) The GeneXpert MRSA Assay (FDA approved for NASAL specimens only), is one component of a comprehensive MRSA colonization surveillance program. It is not intended to diagnose MRSA infection nor to guide or monitor treatment for MRSA infections. Test performance is not FDA approved in patients less than 30 years old. Performed at [facility], [place] [address], [place], [place] [address]   Culture, blood (Routine X 2) w Reflex  to ID Panel     Status: None (Preliminary result)   Collection Time: 04/10/22  1:33 PM   Specimen: BLOOD RIGHT HAND  Result Value Ref Range Status   Specimen Description BLOOD RIGHT HAND  Final   Special Requests   Final    BOTTLES DRAWN AEROBIC AND ANAEROBIC Blood Culture adequate volume   Culture   Final    NO GROWTH < 12 HOURS Performed at [facility], [place] [address], [place], [place] [address]    Report Status PENDING  Incomplete    Studies/Results: DG Chest Port 1  View  Result Date: 04/12/2022 CLINICAL DATA:  Shortness of breath EXAM: PORTABLE CHEST 1 VIEW COMPARISON:  04/09/2022 FINDINGS: Left-sided implanted cardiac device and right-sided nerve stimulator remain in place. Stable cardiomediastinal contours. No focal airspace consolidation, pleural effusion, or pneumothorax. IMPRESSION: Stabled chest radiograph.  No acute cardiopulmonary findings. Electronically Signed   By: Davina Poke D.O.   On: 04/12/2022 08:13   ECHOCARDIOGRAM COMPLETE  Result Date: 04/11/2022    ECHOCARDIOGRAM REPORT   Patient Name:   Douglas Perkins. Date of Exam: 04/11/2022 Medical Rec #:  [id number]           Height:       72.0 in Accession #:    [id number]          Weight:       214.9 lb Date of Birth:  1945/09/28           BSA:          2.197 m Patient Age:    11 years            BP:           128/63 mmHg Patient Gender: M                   HR:           76 bpm. Exam Location:  Inpatient Procedure: 2D Echo, Cardiac Doppler, Color Doppler and Intracardiac            Opacification Agent Indications:    Bacteremia  History:        Patient has prior history of Echocardiogram examinations, most                 recent 05/22/2017. CAD, Abnormal ECG and Pacemaker,                 Arrythmias:Bradycardia, Signs/Symptoms:Chest Pain, Bacteremia,                 Syncope and Dyspnea; Risk Factors:Hypertension, Sleep Apnea and                 Dyslipidemia. Cancer.  Sonographer:    Roseanna Rainbow RDCS Referring Phys: [id number] Aimi Essner N VAN DAM  Sonographer Comments: Technically difficult study due to poor echo windows, suboptimal parasternal window, suboptimal apical window, suboptimal subcostal window and patient is obese. Image acquisition challenging due to patient body habitus. Difficult study, patient moving and uncomfortable. IMPRESSIONS  1. The aortic valve is abnormal; trileaflet aortic valve with mild calcification. In the PLAX, cannot exclude echodensity on the ventricular surfance of the aortic  valve. No AI in this region. There is mild calcification of the aortic valve. There is mild thickening of the aortic valve. Aortic valve regurgitation is not visualized.  2. Left ventricular ejection fraction, by estimation, is 60 to 65%. The left ventricle has normal function. The left ventricle has no  regional wall motion abnormalities. There is moderate concentric left ventricular hypertrophy. Left ventricular diastolic parameters are indeterminate.  3. Right ventricular systolic function is hyperdynamic. The right ventricular size is normal. Tricuspid regurgitation signal is inadequate for assessing PA pressure.  4. The mitral valve is normal in structure. No evidence of mitral valve regurgitation. No evidence of mitral stenosis.  5. The inferior vena cava is normal in size with greater than 50% respiratory variability, suggesting right atrial pressure of 3 mmHg. Comparison(s): No prior Echocardiogram. Conclusion(s)/Recommendation(s): In the setting of bacteremia, consider TEE. FINDINGS  Left Ventricle: Left ventricular ejection fraction, by estimation, is 60 to 65%. The left ventricle has normal function. The left ventricle has no regional wall motion abnormalities. Definity contrast agent was given IV to delineate the left ventricular  endocardial borders. The left ventricular internal cavity size was normal in size. There is moderate concentric left ventricular hypertrophy. Left ventricular diastolic parameters are indeterminate. Right Ventricle: The right ventricular size is normal. No increase in right ventricular wall thickness. Right ventricular systolic function is hyperdynamic. Tricuspid regurgitation signal is inadequate for assessing PA pressure. Left Atrium: Left atrial size was normal in size. Right Atrium: Right atrial size was normal in size. Pericardium: There is no evidence of pericardial effusion. Mitral Valve: The mitral valve is normal in structure. No evidence of mitral valve regurgitation.  No evidence of mitral valve stenosis. Tricuspid Valve: The tricuspid valve is normal in structure. Tricuspid valve regurgitation is not demonstrated. No evidence of tricuspid stenosis. Aortic Valve: The aortic valve is abnormal. There is mild calcification of the aortic valve. There is mild thickening of the aortic valve. Aortic valve regurgitation is not visualized. Pulmonic Valve: The pulmonic valve was not well visualized. Pulmonic valve regurgitation is trivial. Aorta: The aortic root and ascending aorta are structurally normal, with no evidence of dilitation. Venous: The inferior vena cava is normal in size with greater than 50% respiratory variability, suggesting right atrial pressure of 3 mmHg. IAS/Shunts: No atrial level shunt detected by color flow Doppler.  LEFT VENTRICLE PLAX 2D LVIDd:         5.40 cm LVIDs:         3.70 cm LV PW:         1.30 cm LV IVS:        1.20 cm LVOT diam:     2.50 cm LV SV:         67 LV SV Index:   31 LVOT Area:     4.91 cm  LV Volumes (MOD) LV vol d, MOD A2C: 217.0 ml LV vol d, MOD A4C: 171.0 ml LV vol s, MOD A2C: 88.8 ml LV vol s, MOD A4C: 73.2 ml LV SV MOD A2C:     128.2 ml LV SV MOD A4C:     171.0 ml LV SV MOD BP:      114.3 ml RIGHT VENTRICLE             IVC RV S prime:     12.60 cm/s  IVC diam: 1.90 cm TAPSE (M-mode): 2.8 cm LEFT ATRIUM           Index        RIGHT ATRIUM           Index LA diam:      3.70 cm 1.68 cm/m   RA Area:     16.00 cm LA Vol (A2C): 37.8 ml 17.21 ml/m  RA Volume:   40.10 ml  18.25 ml/m LA  Vol (A4C): 31.2 ml 14.20 ml/m  AORTIC VALVE LVOT Vmax:   100.53 cm/s LVOT Vmean:  66.033 cm/s LVOT VTI:    0.137 m  AORTA Ao Root diam: 3.60 cm Ao Asc diam:  3.20 cm MITRAL VALVE MV Area (PHT): 4.76 cm     SHUNTS MV Decel Time: 159 msec     Systemic VTI:  0.14 m MV E velocity: 119.33 cm/s  Systemic Diam: 2.50 cm Rudean Haskell MD Electronically signed by Rudean Haskell MD Signature Date/Time: 04/11/2022/10:48:29 AM    Final        Assessment/Plan:  INTERVAL HISTORY:   One Peripheral blood culture appears to been taken yesterday when he was a difficult stick  2d echocardiogram not conclusive Principal Problem:   Group B streptococcal bacteriuria Active Problems:   Benign essential hypertension   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HCC)   SIRS (systemic inflammatory response syndrome) ([place])   Sepsis ([place])    Douglas Perkins. is a 76 y.o. 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  TTE not conclusively negative and pt needs TEE which is scheduled  Regardless of  whether or notTEE finds vegetations with G+ bacteremia from ID standpoint would clearly favor device extraction  Continue on high-dose penicillin.  Source is unclear. He chronic erythema of his right lower extremity where he had radiation years ago.   I spent 52 minutes with the patient including than 50% of the time in face to face counseling of the patient guarding his group B streptococcus bacteremia with concern for device infection, \ along with review of medical records in preparation for the visit and during the visit and in coordination of his care.    LOS: 2 days   Alcide Evener 04/12/2022, 2:04 PM

## 2022-04-13 ENCOUNTER — Inpatient Hospital Stay (HOSPITAL_COMMUNITY): Payer: Medicare Other

## 2022-04-13 DIAGNOSIS — R609 Edema, unspecified: Secondary | ICD-10-CM

## 2022-04-13 DIAGNOSIS — B955 Unspecified streptococcus as the cause of diseases classified elsewhere: Secondary | ICD-10-CM

## 2022-04-13 DIAGNOSIS — M7989 Other specified soft tissue disorders: Secondary | ICD-10-CM

## 2022-04-13 LAB — BASIC METABOLIC PANEL
Anion gap: 13 (ref 5–15)
BUN: 10 mg/dL (ref 8–23)
CO2: 21 mmol/L — ABNORMAL LOW (ref 22–32)
Calcium: 8.6 mg/dL — ABNORMAL LOW (ref 8.9–10.3)
Chloride: 100 mmol/L (ref 98–111)
Creatinine, Ser: 0.62 mg/dL (ref 0.61–1.24)
GFR, Estimated: >60 mL/min (ref >60)
Glucose, Bld: 133 mg/dL — ABNORMAL HIGH (ref 70–99)
Potassium: 4.5 mmol/L (ref 3.5–5.1)
Sodium: 134 mmol/L — ABNORMAL LOW (ref 135–145)

## 2022-04-13 LAB — CBC WITH DIFFERENTIAL/PLATELET
Abs Immature Granulocytes: 0.12 10*3/uL — ABNORMAL HIGH (ref 0.00–0.07)
Basophils Absolute: 0.1 10*3/uL (ref 0.0–0.1)
Basophils Relative: 1 %
Eosinophils Absolute: 0.2 10*3/uL (ref 0.0–0.5)
Eosinophils Relative: 2 %
HCT: 35.8 % — ABNORMAL LOW (ref 39.0–52.0)
Hemoglobin: 12.5 g/dL — ABNORMAL LOW (ref 13.0–17.0)
Immature Granulocytes: 1 %
Lymphocytes Relative: 23 %
Lymphs Abs: 2.3 10*3/uL (ref 0.7–4.0)
MCH: 31.7 pg (ref 26.0–34.0)
MCHC: 34.9 g/dL (ref 30.0–36.0)
MCV: 90.9 fL (ref 80.0–100.0)
Monocytes Absolute: 1.2 10*3/uL — ABNORMAL HIGH (ref 0.1–1.0)
Monocytes Relative: 12 %
Neutro Abs: 6.2 10*3/uL (ref 1.7–7.7)
Neutrophils Relative %: 61 %
Platelets: 172 10*3/uL (ref 150–400)
RBC: 3.94 MIL/uL — ABNORMAL LOW (ref 4.22–5.81)
RDW: 12.8 % (ref 11.5–15.5)
WBC: 10 10*3/uL (ref 4.0–10.5)
nRBC: 0 % (ref 0.0–0.2)

## 2022-04-13 MED ORDER — CARVEDILOL PHOSPHATE ER 20 MG PO CP24
20.0000 mg | ORAL_CAPSULE | Freq: Every day | ORAL | Status: DC
  Administered 2022-04-13 – 2022-04-14 (×2): 20 mg via ORAL
  Filled 2022-04-13 (×2): qty 1

## 2022-04-13 NOTE — Progress Notes (Signed)
Subjective:  No new complaints   Antibiotics:  Anti-infectives (From admission, onward)    Start     Dose/Rate Route Frequency Ordered Stop   04/10/22 2200  vancomycin (VANCOREADY) IVPB 2000 mg/400 mL  Status:  Discontinued        2,000 mg 200 mL/hr over 120 Minutes Intravenous Every 24 hours 04/09/22 2034 04/10/22 1020   04/10/22 1400  penicillin G potassium 12 Million Units in dextrose 5 % 500 mL continuous infusion        12 Million Units 41.7 mL/hr over 12 Hours Intravenous Every 12 hours 04/10/22 1020     04/10/22 0915  metroNIDAZOLE (FLAGYL) IVPB 500 mg  Status:  Discontinued        500 mg 100 mL/hr over 60 Minutes Intravenous Every 12 hours 04/10/22 0900 04/10/22 1020   04/10/22 0600  ceFEPIme (MAXIPIME) 2 g in sodium chloride 0.9 % 100 mL IVPB  Status:  Discontinued        2 g 200 mL/hr over 30 Minutes Intravenous Every 8 hours 04/09/22 2034 04/10/22 1020   04/09/22 1900  ceFEPIme (MAXIPIME) 2 g in sodium chloride 0.9 % 100 mL IVPB        2 g 200 mL/hr over 30 Minutes Intravenous  Once 04/09/22 1848 04/09/22 2040   04/09/22 1900  metroNIDAZOLE (FLAGYL) IVPB 500 mg        500 mg 100 mL/hr over 60 Minutes Intravenous  Once 04/09/22 1848 04/09/22 2006   04/09/22 1900  vancomycin (VANCOCIN) IVPB 1000 mg/200 mL premix  Status:  Discontinued        1,000 mg 200 mL/hr over 60 Minutes Intravenous  Once 04/09/22 1848 04/09/22 1850   04/09/22 1900  vancomycin (VANCOCIN) IVPB 1000 mg/200 mL premix       See Hyperspace for full Linked Orders Report.   1,000 mg 200 mL/hr over 60 Minutes Intravenous  Once 04/09/22 1850 04/09/22 2137   04/09/22 1900  vancomycin (VANCOCIN) IVPB 1000 mg/200 mL premix       See Hyperspace for full Linked Orders Report.   1,000 mg 200 mL/hr over 60 Minutes Intravenous  Once 04/09/22 1850 04/09/22 2241       Medications: Scheduled Meds:  aspirin EC  81 mg Oral Daily   atorvastatin  40 mg Oral Daily   divalproex  1,000 mg Oral QHS    dorzolamide  1 drop Right Eye BID   enoxaparin (LOVENOX) injection  40 mg Subcutaneous Q24H   gabapentin  300 mg Oral BID   latanoprost  1 drop Right Eye BID   melatonin  10 mg Oral QHS   mometasone-formoterol  2 puff Inhalation BID   pantoprazole  40 mg Oral BID   QUEtiapine  300 mg Oral QHS   tamsulosin  0.4 mg Oral QHS   timolol  1 drop Right Eye BID   Continuous Infusions:  penicillin G potassium 12 Million Units in dextrose 5 % 500 mL continuous infusion 12 Million Units (04/13/22 0248)   PRN Meds:.acetaminophen **OR** acetaminophen, albuterol, ondansetron **OR** ondansetron (ZOFRAN) IV    Objective: Weight change:   Intake/Output Summary (Last 24 hours) at 04/13/2022 1101 Last data filed at 04/12/2022 1734 Gross per 24 hour  Intake --  Output 750 ml  Net -750 ml    Blood pressure 134/66, pulse 87, temperature 97.6 F (36.4 C), temperature source Oral, resp. rate (!) 22, height 6' (1.829 m), weight 127.4 kg, SpO2 95 %.  Temp:  [97.6 F (36.4 C)-98.6 F (37 C)] 97.6 F (36.4 C) (12/14 0813) Pulse Rate:  [81-97] 87 (12/14 0813) Resp:  [19-25] 22 (12/14 0813) BP: (134-154)/(66-75) 134/66 (12/14 0813) SpO2:  [94 %-97 %] 95 % (12/14 0830)  Physical Exam: Physical Exam Constitutional:      Appearance: He is well-developed.  HENT:     Head: Normocephalic and atraumatic.  Eyes:     Conjunctiva/sclera: Conjunctivae normal.  Cardiovascular:     Rate and Rhythm: Normal rate and regular rhythm.  Pulmonary:     Effort: Pulmonary effort is normal. No respiratory distress.  Abdominal:     General: There is no distension.     Palpations: Abdomen is soft.  Musculoskeletal:        General: Normal range of motion.     Cervical back: Normal range of motion and neck supple.  Skin:    General: Skin is warm and dry.     Findings: No erythema or rash.  Neurological:     General: No focal deficit present.     Mental Status: He is alert and oriented to person, place, and  time.  Psychiatric:        Mood and Affect: Mood normal.        Behavior: Behavior normal.        Thought Content: Thought content normal.        Judgment: Judgment normal.      CBC:    BMET Recent Labs    04/12/22 0452 04/13/22 0430  NA 134* 134*  K 4.0 4.5  CL 99 100  CO2 26 21*  GLUCOSE 128* 133*  BUN 9 10  CREATININE 0.49* 0.62  CALCIUM 8.4* 8.6*      Liver Panel  No results for input(s): "PROT", "ALBUMIN", "AST", "ALT", "ALKPHOS", "BILITOT", "BILIDIR", "IBILI" in the last 72 hours.      Sedimentation Rate No results for input(s): "ESRSEDRATE" in the last 72 hours. C-Reactive Protein No results for input(s): "CRP" in the last 72 hours.  Micro Results: Recent Results (from the past 720 hour(s))  Resp panel by RT-PCR (RSV, Flu A&B, Covid) Anterior Nasal Swab     Status: None   Collection Time: 04/09/22  5:18 PM   Specimen: Anterior Nasal Swab  Result Value Ref Range Status   SARS Coronavirus 2 by RT PCR NEGATIVE NEGATIVE Final    Comment: (NOTE) SARS-CoV-2 target nucleic acids are NOT DETECTED.  The SARS-CoV-2 RNA is generally detectable in upper respiratory specimens during the acute phase of infection. The lowest concentration of SARS-CoV-2 viral copies this assay can detect is 138 copies/mL. A negative result does not preclude SARS-Cov-2 infection and should not be used as the sole basis for treatment or other patient management decisions. A negative result may occur with  improper specimen collection/handling, submission of specimen other than nasopharyngeal swab, presence of viral mutation(s) within the areas targeted by this assay, and inadequate number of viral copies(<138 copies/mL). A negative result must be combined with clinical observations, patient history, and epidemiological information. The expected result is Negative.  Fact Sheet for Patients:  EntrepreneurPulse.com.au  Fact Sheet for Healthcare Providers:   IncredibleEmployment.be  This test is no t yet approved or cleared by the [place] FDA and  has been authorized for detection and/or diagnosis of SARS-CoV-2 by FDA under an Emergency Use Authorization (EUA). This EUA will remain  in effect (meaning this test can be used) for the duration of the COVID-19 declaration under  Section 564(b)(1) of the Act, 21 U.S.C.section 360bbb-3(b)(1), unless the authorization is terminated  or revoked sooner.       Influenza A by PCR NEGATIVE NEGATIVE Final   Influenza B by PCR NEGATIVE NEGATIVE Final    Comment: (NOTE) The Xpert Xpress SARS-CoV-2/FLU/RSV plus assay is intended as an aid in the diagnosis of influenza from Nasopharyngeal swab specimens and should not be used as a sole basis for treatment. Nasal washings and aspirates are unacceptable for Xpert Xpress SARS-CoV-2/FLU/RSV testing.  Fact Sheet for Patients: EntrepreneurPulse.com.au  Fact Sheet for Healthcare Providers: IncredibleEmployment.be  This test is not yet approved or cleared by the [place] FDA and has been authorized for detection and/or diagnosis of SARS-CoV-2 by FDA under an Emergency Use Authorization (EUA). This EUA will remain in effect (meaning this test can be used) for the duration of the COVID-19 declaration under Section 564(b)(1) of the Act, 21 U.S.C. section 360bbb-3(b)(1), unless the authorization is terminated or revoked.     Resp Syncytial Virus by PCR NEGATIVE NEGATIVE Final    Comment: (NOTE) Fact Sheet for Patients: EntrepreneurPulse.com.au  Fact Sheet for Healthcare Providers: IncredibleEmployment.be  This test is not yet approved or cleared by the [place] FDA and has been authorized for detection and/or diagnosis of SARS-CoV-2 by FDA under an Emergency Use Authorization (EUA). This EUA will remain in effect (meaning this test can be used) for  the duration of the COVID-19 declaration under Section 564(b)(1) of the Act, 21 U.S.C. section 360bbb-3(b)(1), unless the authorization is terminated or revoked.  Performed at [facility], [place] [address] [place], [place] [address]   Culture, blood (routine x 2)     Status: Abnormal   Collection Time: 04/09/22  5:24 PM   Specimen: BLOOD RIGHT HAND  Result Value Ref Range Status   Specimen Description BLOOD RIGHT HAND  Final   Special Requests   Final    BOTTLES DRAWN AEROBIC AND ANAEROBIC Blood Culture adequate volume   Culture  Setup Time   Final    GRAM POSITIVE COCCI IN CHAINS IN BOTH AEROBIC AND ANAEROBIC BOTTLES CRITICAL RESULT CALLED TO, READ BACK BY AND VERIFIED WITH: [place] C [id number] FCP Performed at [facility], [place] [address] [place], [place] [address]    Culture GROUP B STREP(S.AGALACTIAE)ISOLATED (A)  Final   Report Status 04/12/2022 FINAL  Final   Organism ID, Bacteria GROUP B STREP(S.AGALACTIAE)ISOLATED  Final      Susceptibility   Group b strep(s.agalactiae)isolated - MIC*    CLINDAMYCIN >=1 RESISTANT Resistant     AMPICILLIN <=0.25 SENSITIVE Sensitive     ERYTHROMYCIN >=8 RESISTANT Resistant     VANCOMYCIN 0.5 SENSITIVE Sensitive     CEFTRIAXONE <=0.12 SENSITIVE Sensitive     LEVOFLOXACIN 1 SENSITIVE Sensitive     PENICILLIN <=0.06 SENSITIVE Sensitive     * GROUP B STREP(S.AGALACTIAE)ISOLATED  Blood Culture ID Panel (Reflexed)     Status: Abnormal   Collection Time: 04/09/22  5:24 PM  Result Value Ref Range Status   Enterococcus faecalis NOT DETECTED NOT DETECTED Final   Enterococcus Faecium NOT DETECTED NOT DETECTED Final   Listeria monocytogenes NOT DETECTED NOT DETECTED Final   Staphylococcus species NOT DETECTED NOT DETECTED Final   Staphylococcus aureus (BCID) NOT DETECTED NOT DETECTED Final   Staphylococcus epidermidis NOT DETECTED NOT DETECTED Final   Staphylococcus lugdunensis NOT DETECTED NOT DETECTED Final    Streptococcus species DETECTED (A) NOT DETECTED Final    Comment:  CRITICAL RESULT CALLED TO, READ BACK BY AND VERIFIED WITH: PHARMD ELIZABETH M [id number] FCP    Streptococcus agalactiae DETECTED (A) NOT DETECTED Final    Comment: CRITICAL RESULT CALLED TO, READ BACK BY AND VERIFIED WITH: PHARMD ELIZABETH M [id number] FCP    Streptococcus pneumoniae NOT DETECTED NOT DETECTED Final   Streptococcus pyogenes NOT DETECTED NOT DETECTED Final   A.calcoaceticus-baumannii NOT DETECTED NOT DETECTED Final   Bacteroides fragilis NOT DETECTED NOT DETECTED Final   Enterobacterales NOT DETECTED NOT DETECTED Final   Enterobacter cloacae complex NOT DETECTED NOT DETECTED Final   Escherichia coli NOT DETECTED NOT DETECTED Final   Klebsiella aerogenes NOT DETECTED NOT DETECTED Final   Klebsiella oxytoca NOT DETECTED NOT DETECTED Final   Klebsiella pneumoniae NOT DETECTED NOT DETECTED Final   Proteus species NOT DETECTED NOT DETECTED Final   Salmonella species NOT DETECTED NOT DETECTED Final   Serratia marcescens NOT DETECTED NOT DETECTED Final   Haemophilus influenzae NOT DETECTED NOT DETECTED Final   Neisseria meningitidis NOT DETECTED NOT DETECTED Final   Pseudomonas aeruginosa NOT DETECTED NOT DETECTED Final   Stenotrophomonas maltophilia NOT DETECTED NOT DETECTED Final   Candida albicans NOT DETECTED NOT DETECTED Final   Candida auris NOT DETECTED NOT DETECTED Final   Candida glabrata NOT DETECTED NOT DETECTED Final   Candida krusei NOT DETECTED NOT DETECTED Final   Candida parapsilosis NOT DETECTED NOT DETECTED Final   Candida tropicalis NOT DETECTED NOT DETECTED Final   Cryptococcus neoformans/gattii NOT DETECTED NOT DETECTED Final    Comment: Performed at [facility], [address], [place], [place] [address]  Culture, blood (routine x 2)     Status: Abnormal   Collection Time: 04/09/22  5:37 PM   Specimen: BLOOD LEFT HAND  Result Value Ref Range Status   Specimen Description  BLOOD LEFT HAND  Final   Special Requests   Final    BOTTLES DRAWN AEROBIC AND ANAEROBIC Blood Culture adequate volume   Culture  Setup Time   Final    GRAM POSITIVE COCCI IN CHAINS IN BOTH AEROBIC AND ANAEROBIC BOTTLES CRITICAL VALUE NOTED.  VALUE IS CONSISTENT WITH PREVIOUSLY REPORTED AND CALLED VALUE.    Culture (A)  Final    GROUP B STREP(S.AGALACTIAE)ISOLATED SUSCEPTIBILITIES PERFORMED ON PREVIOUS CULTURE WITHIN THE LAST 5 DAYS. Performed at [facility], [place] [address] [place], [place] [address]    Report Status 04/12/2022 FINAL  Final  MRSA Next Gen by PCR, Nasal     Status: None   Collection Time: 04/10/22  6:25 AM   Specimen: Nasal Mucosa; Nasal Swab  Result Value Ref Range Status   MRSA by PCR Next Gen NOT DETECTED NOT DETECTED Final    Comment: (NOTE) The GeneXpert MRSA Assay (FDA approved for NASAL specimens only), is one component of a comprehensive MRSA colonization surveillance program. It is not intended to diagnose MRSA infection nor to guide or monitor treatment for MRSA infections. Test performance is not FDA approved in patients less than 52 years old. Performed at [facility], [place] [address], [place], [place] [address]   Culture, blood (Routine X 2) w Reflex to ID Panel     Status: None (Preliminary result)   Collection Time: 04/10/22  1:33 PM   Specimen: BLOOD RIGHT HAND  Result Value Ref Range Status   Specimen Description BLOOD RIGHT HAND  Final   Special Requests   Final    BOTTLES DRAWN AEROBIC AND ANAEROBIC Blood  Culture adequate volume   Culture   Final    NO GROWTH 1 DAY Performed at [facility], [place] [address], [place], [place] [address]    Report Status PENDING  Incomplete    Studies/Results: DG Chest Port 1 View  Result Date: 04/12/2022 CLINICAL DATA:  Shortness of breath EXAM: PORTABLE CHEST 1 VIEW COMPARISON:  04/09/2022 FINDINGS: Left-sided implanted cardiac device and right-sided nerve stimulator remain in place.  Stable cardiomediastinal contours. No focal airspace consolidation, pleural effusion, or pneumothorax. IMPRESSION: Stabled chest radiograph.  No acute cardiopulmonary findings. Electronically Signed   By: Davina Poke D.O.   On: 04/12/2022 08:13      Assessment/Plan:  INTERVAL HISTORY:   Repeat blood culture taken on 12/11 no growth so far        Principal Problem:   Streptococcal bacteremia Active Problems:   Benign essential hypertension   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HCC)   SIRS (systemic inflammatory response syndrome) (HCC)   Sepsis (HCC)   Pacemaker infection (HCC)    Douglas Perkins. is a 76 y.o. 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  TTE not conclusively negative and pt needs TEE which is scheduled  Regardless of  whether or notTEE finds vegetations with G+ bacteremia from ID standpoint would clearly favor device extraction   Continue high dose PCN  Again I would favor extraction of the device though pt sounds like he is fearful of this approach. Morbidity and mortality are generally better with early extraction    I spent 52 minutes with the patient including than 50% of the time in face to face counseling of the patient  re his GBS bacteremia with likely cardiac device infection along with review of medical records in preparation for the visit and during the visit and in coordination of his care.    LOS: 3 days   Alcide Evener 04/13/2022, 11:01 AM

## 2022-04-13 NOTE — H&P (View-Only) (Signed)
PROGRESS NOTE  Douglas Perkins. NOB:[id number] DOB: [birth year] DOA: 04/09/2022 PCP: [name], MD  HPI/Recap of past 24 hours:  Douglas Perkins. is a 76 y.o. 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 Pt presents to ED from [place].  Was found on the ground after an unwitnessed fall. Was nauseous and diaphoretic and vomited once. Found to be altered initially and sats were around 90% so placed on nonrebreather. In the ED, noted to be septic, started on broad-spectrum antibiotics. Patient admitted for further management.    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[facility])   Severe sepsis likely 2/2 Group B streptococcal bacteremia On admission leukocytosis, febrile, tachycardic, tachypneic, lactic acid of 3.5 Currently afebrile, with resolving leukocytosis, lactic acid resolved Procalcitonin elevated, trending down BC X 2 growing group B streptococcus, repeat pending UA negative CTA chest neg for PNA or PE CT A/p: Noted area of inflammation in right groin secondary to prior radiation therapy ID on board, appreciate recs TTE showed EF of 60 to 65%, no regional wall motion abnormality, recommend TEE scheduled for 12/15 (pacemaker) Cardiology and EP consulted, rec lead extraction, but patient refused ID narrowed broad-spectrum antibiotics to penicillin S/P IV fluids, BP stable -Plan for TEE tomorrow, but general recommendation remains for pacemaker with lead extraction regardless, but patient currently refusing that.  As well patient with right-sided inspire device.  Hypertension BP noted to be soft/fluctuating likely 2/2 sepsis, holding his meds initially, now  started to increase, so we will start on Coreg CR lower dose 20 mg oral daily and uptitrate to home dose of 80 mg oral daily, meanwhile continue to hold amlodipine.  CAD/HLD Mildly elevated troponin-likely demand ischemia Echo noted as above Denies any chest pain Continue aspirin, Lipitor  Merkel cell carcinoma S/p radiation therapy CT showing area of inflammation in right groin RLE swollen with erythema, will order doppler  Obstructive sleep apnea Does have implantable inspire device  Obesity Lifestyle modification advised    Estimated body mass index is 38.09 kg/m as calculated from the following:   Height as of this encounter: 6' (1.829 m).   Weight as of this encounter: 127.4 kg.     Code Status: Full  Family Communication: None at bedside  Disposition Plan: Status is: Inpatient The patient will require care spanning > 2 midnights and should be moved to inpatient because: Level of care      Consultants: ID EP  Procedures: None  Antimicrobials: Penicillin G  DVT prophylaxis: Lovenox   Objective: Vitals:   04/13/22 0030 04/13/22 0352 04/13/22 0813 04/13/22 0830  BP: (!) 151/70 (!) 153/75 134/66   Pulse: 97 81 87   Resp: (!) 25 19 (!) 22   Temp: 98 F (36.7 C) 98.6 F (37 C) 97.6 F (36.4 C)   TempSrc: Oral Oral Oral   SpO2: 97% 95% 94% 95%  Weight:      Height:        Intake/Output Summary (Last 24 hours) at 04/13/2022 1222 Last data filed at 04/12/2022 1734 Gross per 24 hour  Intake --  Output 750 ml  Net -750 ml   Filed Weights   04/09/22 1727 04/11/22 1515  Weight:  97.5 kg 127.4 kg    Exam:   Awake Alert, Oriented X 3, No new F.N deficits, Normal affect Symmetrical Chest wall movement, Good air movement bilaterally, CTAB RRR,No Gallops,Rubs or new Murmurs, No Parasternal Heave +ve B.Sounds, Abd Soft, No tenderness, No rebound - guarding or rigidity. No Cyanosis, Clubbing or edema, No new Rash or bruise      Data  Reviewed: CBC: Recent Labs  Lab 04/09/22 1737 04/09/22 1748 04/10/22 0500 04/11/22 0755 04/12/22 0452 04/13/22 0430  WBC 20.4*  --  31.7* 21.1* 12.5* 10.0  NEUTROABS 17.5*  --   --  17.2* 9.3* 6.2  HGB 13.6 13.6 12.0* 11.2* 11.2* 12.5*  HCT 40.3 40.0 35.0* 33.3* 32.3* 35.8*  MCV 92.9  --  93.3 95.4 91.2 90.9  PLT 284  --  226 202 196 027   Basic Metabolic Panel: Recent Labs  Lab 04/09/22 1737 04/09/22 1748 04/10/22 0500 04/11/22 0755 04/12/22 0452 04/13/22 0430  NA 134* 135 134* 135 134* 134*  K 3.6 3.7 3.8 3.9 4.0 4.5  CL 100 101 100 99 99 100  CO2 20*  --  '24 25 26 '$ 21*  GLUCOSE 120* 125* 150* 120* 128* 133*  BUN '23 23 17 13 9 10  '$ CREATININE 1.24 1.00 0.88 0.58* 0.49* 0.62  CALCIUM 8.9  --  7.9* 8.0* 8.4* 8.6*   GFR: Estimated Creatinine Clearance: 104.9 mL/min (by C-G formula based on SCr of 0.62 mg/dL). Liver Function Tests: Recent Labs  Lab 04/09/22 1737 04/10/22 0500  AST 33 56*  ALT 26 26  ALKPHOS 66 52  BILITOT 0.4 0.5  PROT 7.3 6.4*  ALBUMIN 3.5 2.8*   Recent Labs  Lab 04/09/22 1737  LIPASE 39   No results for input(s): "AMMONIA" in the last 168 hours. Coagulation Profile: Recent Labs  Lab 04/09/22 1737 04/10/22 0500  INR 1.1 1.2   Cardiac Enzymes: Recent Labs  Lab 04/09/22 2010  CKTOTAL 847*   BNP (last 3 results) No results for input(s): "PROBNP" in the last 8760 hours. HbA1C: No results for input(s): "HGBA1C" in the last 72 hours. CBG: No results for input(s): "GLUCAP" in the last 168 hours. Lipid Profile: No results for input(s): "CHOL", "HDL", "LDLCALC", "TRIG", "CHOLHDL", "LDLDIRECT" in the last 72 hours. Thyroid Function Tests: No results for input(s): "TSH", "T4TOTAL", "FREET4", "T3FREE", "THYROIDAB" in the last 72 hours. Anemia Panel: No results for input(s): "VITAMINB12", "FOLATE", "FERRITIN", "TIBC", "IRON", "RETICCTPCT" in the last 72 hours. Urine analysis:    Component Value Date/Time   COLORURINE YELLOW  04/09/2022 2135   APPEARANCEUR HAZY (A) 04/09/2022 2135   APPEARANCEUR Clear 05/24/2015 1328   LABSPEC 1.017 04/09/2022 2135   PHURINE 6.0 04/09/2022 2135   GLUCOSEU NEGATIVE 04/09/2022 2135   HGBUR SMALL (A) 04/09/2022 2135   BILIRUBINUR NEGATIVE 04/09/2022 2135   BILIRUBINUR Negative 05/24/2015 [place] 04/09/2022 2135   PROTEINUR 30 (A) 04/09/2022 2135   NITRITE NEGATIVE 04/09/2022 2135   LEUKOCYTESUR NEGATIVE 04/09/2022 2135   Sepsis Labs: '@LABRCNTIP'$ (procalcitonin:4,lacticidven:4)  ) Recent Results (from the past 240 hour(s))  Resp panel by RT-PCR (RSV, Flu A&B, Covid) Anterior Nasal Swab     Status: None   Collection Time: 04/09/22  5:18 PM   Specimen: Anterior Nasal Swab  Result Value Ref Range Status   SARS Coronavirus 2 by RT PCR NEGATIVE NEGATIVE Final    Comment: (NOTE) SARS-CoV-2 target nucleic acids are NOT DETECTED.  The SARS-CoV-2 RNA is generally detectable in upper respiratory specimens during  the acute phase of infection. The lowest concentration of SARS-CoV-2 viral copies this assay can detect is 138 copies/mL. A negative result does not preclude SARS-Cov-2 infection and should not be used as the sole basis for treatment or other patient management decisions. A negative result may occur with  improper specimen collection/handling, submission of specimen other than nasopharyngeal swab, presence of viral mutation(s) within the areas targeted by this assay, and inadequate number of viral copies(<138 copies/mL). A negative result must be combined with clinical observations, patient history, and epidemiological information. The expected result is Negative.  Fact Sheet for Patients:  EntrepreneurPulse.com.au  Fact Sheet for Healthcare Providers:  IncredibleEmployment.be  This test is no t yet approved or cleared by the [place] FDA and  has been authorized for detection and/or diagnosis of SARS-CoV-2  by FDA under an Emergency Use Authorization (EUA). This EUA will remain  in effect (meaning this test can be used) for the duration of the COVID-19 declaration under Section 564(b)(1) of the Act, 21 U.S.C.section 360bbb-3(b)(1), unless the authorization is terminated  or revoked sooner.       Influenza A by PCR NEGATIVE NEGATIVE Final   Influenza B by PCR NEGATIVE NEGATIVE Final    Comment: (NOTE) The Xpert Xpress SARS-CoV-2/FLU/RSV plus assay is intended as an aid in the diagnosis of influenza from Nasopharyngeal swab specimens and should not be used as a sole basis for treatment. Nasal washings and aspirates are unacceptable for Xpert Xpress SARS-CoV-2/FLU/RSV testing.  Fact Sheet for Patients: EntrepreneurPulse.com.au  Fact Sheet for Healthcare Providers: IncredibleEmployment.be  This test is not yet approved or cleared by the [place] FDA and has been authorized for detection and/or diagnosis of SARS-CoV-2 by FDA under an Emergency Use Authorization (EUA). This EUA will remain in effect (meaning this test can be used) for the duration of the COVID-19 declaration under Section 564(b)(1) of the Act, 21 U.S.C. section 360bbb-3(b)(1), unless the authorization is terminated or revoked.     Resp Syncytial Virus by PCR NEGATIVE NEGATIVE Final    Comment: (NOTE) Fact Sheet for Patients: EntrepreneurPulse.com.au  Fact Sheet for Healthcare Providers: IncredibleEmployment.be  This test is not yet approved or cleared by the [place] FDA and has been authorized for detection and/or diagnosis of SARS-CoV-2 by FDA under an Emergency Use Authorization (EUA). This EUA will remain in effect (meaning this test can be used) for the duration of the COVID-19 declaration under Section 564(b)(1) of the Act, 21 U.S.C. section 360bbb-3(b)(1), unless the authorization is terminated or revoked.  Performed at  [facility], [place] [address] [place], [place] [address]   Culture, blood (routine x 2)     Status: Abnormal   Collection Time: 04/09/22  5:24 PM   Specimen: BLOOD RIGHT HAND  Result Value Ref Range Status   Specimen Description BLOOD RIGHT HAND  Final   Special Requests   Final    BOTTLES DRAWN AEROBIC AND ANAEROBIC Blood Culture adequate volume   Culture  Setup Time   Final    GRAM POSITIVE COCCI IN CHAINS IN BOTH AEROBIC AND ANAEROBIC BOTTLES CRITICAL RESULT CALLED TO, READ BACK BY AND VERIFIED WITH: Leggett U [id number] FCP Performed at [facility], [place] [address] [place], [place] [address]    Culture GROUP B STREP(S.AGALACTIAE)ISOLATED (A)  Final   Report Status 04/12/2022 FINAL  Final   Organism ID, Bacteria GROUP B STREP(S.AGALACTIAE)ISOLATED  Final      Susceptibility   Group b strep(s.agalactiae)isolated -  MIC*    CLINDAMYCIN >=1 RESISTANT Resistant     AMPICILLIN <=0.25 SENSITIVE Sensitive     ERYTHROMYCIN >=8 RESISTANT Resistant     VANCOMYCIN 0.5 SENSITIVE Sensitive     CEFTRIAXONE <=0.12 SENSITIVE Sensitive     LEVOFLOXACIN 1 SENSITIVE Sensitive     PENICILLIN <=0.06 SENSITIVE Sensitive     * GROUP B STREP(S.AGALACTIAE)ISOLATED  Blood Culture ID Panel (Reflexed)     Status: Abnormal   Collection Time: 04/09/22  5:24 PM  Result Value Ref Range Status   Enterococcus faecalis NOT DETECTED NOT DETECTED Final   Enterococcus Faecium NOT DETECTED NOT DETECTED Final   Listeria monocytogenes NOT DETECTED NOT DETECTED Final   Staphylococcus species NOT DETECTED NOT DETECTED Final   Staphylococcus aureus (BCID) NOT DETECTED NOT DETECTED Final   Staphylococcus epidermidis NOT DETECTED NOT DETECTED Final   Staphylococcus lugdunensis NOT DETECTED NOT DETECTED Final   Streptococcus species DETECTED (A) NOT DETECTED Final    Comment: CRITICAL RESULT CALLED TO, READ BACK BY AND VERIFIED WITH: PHARMD ELIZABETH M [id number] FCP    Streptococcus agalactiae  DETECTED (A) NOT DETECTED Final    Comment: CRITICAL RESULT CALLED TO, READ BACK BY AND VERIFIED WITH: PHARMD ELIZABETH M [id number] FCP    Streptococcus pneumoniae NOT DETECTED NOT DETECTED Final   Streptococcus pyogenes NOT DETECTED NOT DETECTED Final   A.calcoaceticus-baumannii NOT DETECTED NOT DETECTED Final   Bacteroides fragilis NOT DETECTED NOT DETECTED Final   Enterobacterales NOT DETECTED NOT DETECTED Final   Enterobacter cloacae complex NOT DETECTED NOT DETECTED Final   Escherichia coli NOT DETECTED NOT DETECTED Final   Klebsiella aerogenes NOT DETECTED NOT DETECTED Final   Klebsiella oxytoca NOT DETECTED NOT DETECTED Final   Klebsiella pneumoniae NOT DETECTED NOT DETECTED Final   Proteus species NOT DETECTED NOT DETECTED Final   Salmonella species NOT DETECTED NOT DETECTED Final   Serratia marcescens NOT DETECTED NOT DETECTED Final   Haemophilus influenzae NOT DETECTED NOT DETECTED Final   Neisseria meningitidis NOT DETECTED NOT DETECTED Final   Pseudomonas aeruginosa NOT DETECTED NOT DETECTED Final   Stenotrophomonas maltophilia NOT DETECTED NOT DETECTED Final   Candida albicans NOT DETECTED NOT DETECTED Final   Candida auris NOT DETECTED NOT DETECTED Final   Candida glabrata NOT DETECTED NOT DETECTED Final   Candida krusei NOT DETECTED NOT DETECTED Final   Candida parapsilosis NOT DETECTED NOT DETECTED Final   Candida tropicalis NOT DETECTED NOT DETECTED Final   Cryptococcus neoformans/gattii NOT DETECTED NOT DETECTED Final    Comment: Performed at [facility], [address] [place], [place] [address]  Culture, blood (routine x 2)     Status: Abnormal   Collection Time: 04/09/22  5:37 PM   Specimen: BLOOD LEFT HAND  Result Value Ref Range Status   Specimen Description BLOOD LEFT HAND  Final   Special Requests   Final    BOTTLES DRAWN AEROBIC AND ANAEROBIC Blood Culture adequate volume   Culture  Setup Time   Final    GRAM POSITIVE COCCI IN CHAINS IN BOTH  AEROBIC AND ANAEROBIC BOTTLES CRITICAL VALUE NOTED.  VALUE IS CONSISTENT WITH PREVIOUSLY REPORTED AND CALLED VALUE.    Culture (A)  Final    GROUP B STREP(S.AGALACTIAE)ISOLATED SUSCEPTIBILITIES PERFORMED ON PREVIOUS CULTURE WITHIN THE LAST 5 DAYS. Performed at [facility], [place] [address], [place],  [address]    Report Status 04/12/2022 FINAL  Final  MRSA Next Gen by PCR, Nasal  Status: None   Collection Time: 04/10/22  6:25 AM   Specimen: Nasal Mucosa; Nasal Swab  Result Value Ref Range Status   MRSA by PCR Next Gen NOT DETECTED NOT DETECTED Final    Comment: (NOTE) The GeneXpert MRSA Assay (FDA approved for NASAL specimens only), is one component of a comprehensive MRSA colonization surveillance program. It is not intended to diagnose MRSA infection nor to guide or monitor treatment for MRSA infections. Test performance is not FDA approved in patients less than 41 years old. Performed at [facility], [place] [address] [place], [place] [address]   Culture, blood (Routine X 2) w Reflex to ID Panel     Status: None (Preliminary result)   Collection Time: 04/10/22  1:33 PM   Specimen: BLOOD RIGHT HAND  Result Value Ref Range Status   Specimen Description BLOOD RIGHT HAND  Final   Special Requests   Final    BOTTLES DRAWN AEROBIC AND ANAEROBIC Blood Culture adequate volume   Culture   Final    NO GROWTH 2 DAYS Performed at [facility], [place] [address] [place],  [address]    Report Status PENDING  Incomplete      Studies: No results found.  Scheduled Meds:  aspirin EC  81 mg Oral Daily   atorvastatin  40 mg Oral Daily   divalproex  1,000 mg Oral QHS   dorzolamide  1 drop Right Eye BID   enoxaparin (LOVENOX) injection  40 mg Subcutaneous Q24H   gabapentin  300 mg Oral BID   latanoprost  1 drop Right Eye BID   melatonin  10 mg Oral QHS   mometasone-formoterol  2 puff Inhalation BID   pantoprazole  40 mg Oral BID   QUEtiapine  300 mg  Oral QHS   tamsulosin  0.4 mg Oral QHS   timolol  1 drop Right Eye BID    Continuous Infusions:  penicillin G potassium 12 Million Units in dextrose 5 % 500 mL continuous infusion 12 Million Units (04/13/22 0248)     LOS: 3 days     Phillips Climes, MD [facility]  If 7PM-7AM, please contact night-coverage [email or web address] 04/13/2022, 12:22 PM

## 2022-04-13 NOTE — Progress Notes (Signed)
PROGRESS NOTE  Douglas Perkins. ZOX:[id number] DOB: February 16, 1946 DOA: 04/09/2022 PCP: [name], MD  HPI/Recap of past 24 hours:  Douglas Perkins. is a 76 y.o. 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 Pt presents to ED from [place].  Was found on the ground after an unwitnessed fall. Was nauseous and diaphoretic and vomited once. Found to be altered initially and sats were around 90% so placed on nonrebreather. In the ED, noted to be septic, started on broad-spectrum antibiotics. Patient admitted for further management.    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[place])   Severe sepsis likely 2/2 Group B streptococcal bacteremia On admission leukocytosis, febrile, tachycardic, tachypneic, lactic acid of 3.5 Currently afebrile, with resolving leukocytosis, lactic acid resolved Procalcitonin elevated, trending down BC X 2 growing group B streptococcus, repeat pending UA negative CTA chest neg for PNA or PE CT A/p: Noted area of inflammation in right groin secondary to prior radiation therapy ID on board, appreciate recs TTE showed EF of 60 to 65%, no regional wall motion abnormality, recommend TEE scheduled for 12/15 (pacemaker) Cardiology and EP consulted, rec lead extraction, but patient refused ID narrowed broad-spectrum antibiotics to penicillin S/P IV fluids, BP stable -Plan for TEE tomorrow, but general recommendation remains for pacemaker with lead extraction regardless, but patient currently refusing that.  As well patient with right-sided inspire device.  Hypertension BP noted to be soft/fluctuating likely 2/2 sepsis, holding his meds initially, now  started to increase, so we will start on Coreg CR lower dose 20 mg oral daily and uptitrate to home dose of 80 mg oral daily, meanwhile continue to hold amlodipine.  CAD/HLD Mildly elevated troponin-likely demand ischemia Echo noted as above Denies any chest pain Continue aspirin, Lipitor  Merkel cell carcinoma S/p radiation therapy CT showing area of inflammation in right groin RLE swollen with erythema, will order doppler  Obstructive sleep apnea Does have implantable inspire device  Obesity Lifestyle modification advised    Estimated body mass index is 38.09 kg/m as calculated from the following:   Height as of this encounter: 6' (1.829 m).   Weight as of this encounter: 127.4 kg.     Code Status: Full  Family Communication: None at bedside  Disposition Plan: Status is: Inpatient The patient will require care spanning > 2 midnights and should be moved to inpatient because: Level of care      Consultants: ID EP  Procedures: None  Antimicrobials: Penicillin G  DVT prophylaxis: Lovenox   Objective: Vitals:   04/13/22 0030 04/13/22 0352 04/13/22 0813 04/13/22 0830  BP: (!) 151/70 (!) 153/75 134/66   Pulse: 97 81 87   Resp: (!) 25 19 (!) 22   Temp: 98 F (36.7 C) 98.6 F (37 C) 97.6 F (36.4 C)   TempSrc: Oral Oral Oral   SpO2: 97% 95% 94% 95%  Weight:      Height:        Intake/Output Summary (Last 24 hours) at 04/13/2022 1222 Last data filed at 04/12/2022 1734 Gross per 24 hour  Intake --  Output 750 ml  Net -750 ml   Filed Weights   04/09/22 1727 04/11/22 1515  Weight:  97.5 kg 127.4 kg    Exam:   Awake Alert, Oriented X 3, No new F.N deficits, Normal affect Symmetrical Chest wall movement, Good air movement bilaterally, CTAB RRR,No Gallops,Rubs or new Murmurs, No Parasternal Heave +ve B.Sounds, Abd Soft, No tenderness, No rebound - guarding or rigidity. No Cyanosis, Clubbing or edema, No new Rash or bruise      Data  Reviewed: CBC: Recent Labs  Lab 04/09/22 1737 04/09/22 1748 04/10/22 0500 04/11/22 0755 04/12/22 0452 04/13/22 0430  WBC 20.4*  --  31.7* 21.1* 12.5* 10.0  NEUTROABS 17.5*  --   --  17.2* 9.3* 6.2  HGB 13.6 13.6 12.0* 11.2* 11.2* 12.5*  HCT 40.3 40.0 35.0* 33.3* 32.3* 35.8*  MCV 92.9  --  93.3 95.4 91.2 90.9  PLT 284  --  226 202 196 326   Basic Metabolic Panel: Recent Labs  Lab 04/09/22 1737 04/09/22 1748 04/10/22 0500 04/11/22 0755 04/12/22 0452 04/13/22 0430  NA 134* 135 134* 135 134* 134*  K 3.6 3.7 3.8 3.9 4.0 4.5  CL 100 101 100 99 99 100  CO2 20*  --  '24 25 26 '$ 21*  GLUCOSE 120* 125* 150* 120* 128* 133*  BUN '23 23 17 13 9 10  '$ CREATININE 1.24 1.00 0.88 0.58* 0.49* 0.62  CALCIUM 8.9  --  7.9* 8.0* 8.4* 8.6*   GFR: Estimated Creatinine Clearance: 104.9 mL/min (by C-G formula based on SCr of 0.62 mg/dL). Liver Function Tests: Recent Labs  Lab 04/09/22 1737 04/10/22 0500  AST 33 56*  ALT 26 26  ALKPHOS 66 52  BILITOT 0.4 0.5  PROT 7.3 6.4*  ALBUMIN 3.5 2.8*   Recent Labs  Lab 04/09/22 1737  LIPASE 39   No results for input(s): "AMMONIA" in the last 168 hours. Coagulation Profile: Recent Labs  Lab 04/09/22 1737 04/10/22 0500  INR 1.1 1.2   Cardiac Enzymes: Recent Labs  Lab 04/09/22 2010  CKTOTAL 847*   BNP (last 3 results) No results for input(s): "PROBNP" in the last 8760 hours. HbA1C: No results for input(s): "HGBA1C" in the last 72 hours. CBG: No results for input(s): "GLUCAP" in the last 168 hours. Lipid Profile: No results for input(s): "CHOL", "HDL", "LDLCALC", "TRIG", "CHOLHDL", "LDLDIRECT" in the last 72 hours. Thyroid Function Tests: No results for input(s): "TSH", "T4TOTAL", "FREET4", "T3FREE", "THYROIDAB" in the last 72 hours. Anemia Panel: No results for input(s): "VITAMINB12", "FOLATE", "FERRITIN", "TIBC", "IRON", "RETICCTPCT" in the last 72 hours. Urine analysis:    Component Value Date/Time   COLORURINE YELLOW  04/09/2022 2135   APPEARANCEUR HAZY (A) 04/09/2022 2135   APPEARANCEUR Clear 05/24/2015 1328   LABSPEC 1.017 04/09/2022 2135   PHURINE 6.0 04/09/2022 2135   GLUCOSEU NEGATIVE 04/09/2022 2135   HGBUR SMALL (A) 04/09/2022 2135   BILIRUBINUR NEGATIVE 04/09/2022 2135   BILIRUBINUR Negative 05/24/2015 [place] 04/09/2022 2135   PROTEINUR 30 (A) 04/09/2022 2135   NITRITE NEGATIVE 04/09/2022 2135   LEUKOCYTESUR NEGATIVE 04/09/2022 2135   Sepsis Labs: '@LABRCNTIP'$ (procalcitonin:4,lacticidven:4)  ) Recent Results (from the past 240 hour(s))  Resp panel by RT-PCR (RSV, Flu A&B, Covid) Anterior Nasal Swab     Status: None   Collection Time: 04/09/22  5:18 PM   Specimen: Anterior Nasal Swab  Result Value Ref Range Status   SARS Coronavirus 2 by RT PCR NEGATIVE NEGATIVE Final    Comment: (NOTE) SARS-CoV-2 target nucleic acids are NOT DETECTED.  The SARS-CoV-2 RNA is generally detectable in upper respiratory specimens during  the acute phase of infection. The lowest concentration of SARS-CoV-2 viral copies this assay can detect is 138 copies/mL. A negative result does not preclude SARS-Cov-2 infection and should not be used as the sole basis for treatment or other patient management decisions. A negative result may occur with  improper specimen collection/handling, submission of specimen other than nasopharyngeal swab, presence of viral mutation(s) within the areas targeted by this assay, and inadequate number of viral copies(<138 copies/mL). A negative result must be combined with clinical observations, patient history, and epidemiological information. The expected result is Negative.  Fact Sheet for Patients:  EntrepreneurPulse.com.au  Fact Sheet for Healthcare Providers:  IncredibleEmployment.be  This test is no t yet approved or cleared by the [place] FDA and  has been authorized for detection and/or diagnosis of SARS-CoV-2  by FDA under an Emergency Use Authorization (EUA). This EUA will remain  in effect (meaning this test can be used) for the duration of the COVID-19 declaration under Section 564(b)(1) of the Act, 21 U.S.C.section 360bbb-3(b)(1), unless the authorization is terminated  or revoked sooner.       Influenza A by PCR NEGATIVE NEGATIVE Final   Influenza B by PCR NEGATIVE NEGATIVE Final    Comment: (NOTE) The Xpert Xpress SARS-CoV-2/FLU/RSV plus assay is intended as an aid in the diagnosis of influenza from Nasopharyngeal swab specimens and should not be used as a sole basis for treatment. Nasal washings and aspirates are unacceptable for Xpert Xpress SARS-CoV-2/FLU/RSV testing.  Fact Sheet for Patients: EntrepreneurPulse.com.au  Fact Sheet for Healthcare Providers: IncredibleEmployment.be  This test is not yet approved or cleared by the [place] FDA and has been authorized for detection and/or diagnosis of SARS-CoV-2 by FDA under an Emergency Use Authorization (EUA). This EUA will remain in effect (meaning this test can be used) for the duration of the COVID-19 declaration under Section 564(b)(1) of the Act, 21 U.S.C. section 360bbb-3(b)(1), unless the authorization is terminated or revoked.     Resp Syncytial Virus by PCR NEGATIVE NEGATIVE Final    Comment: (NOTE) Fact Sheet for Patients: EntrepreneurPulse.com.au  Fact Sheet for Healthcare Providers: IncredibleEmployment.be  This test is not yet approved or cleared by the [place] FDA and has been authorized for detection and/or diagnosis of SARS-CoV-2 by FDA under an Emergency Use Authorization (EUA). This EUA will remain in effect (meaning this test can be used) for the duration of the COVID-19 declaration under Section 564(b)(1) of the Act, 21 U.S.C. section 360bbb-3(b)(1), unless the authorization is terminated or revoked.  Performed at  [facility], [place] [address], [place], [place] [address]   Culture, blood (routine x 2)     Status: Abnormal   Collection Time: 04/09/22  5:24 PM   Specimen: BLOOD RIGHT HAND  Result Value Ref Range Status   Specimen Description BLOOD RIGHT HAND  Final   Special Requests   Final    BOTTLES DRAWN AEROBIC AND ANAEROBIC Blood Culture adequate volume   Culture  Setup Time   Final    GRAM POSITIVE COCCI IN CHAINS IN BOTH AEROBIC AND ANAEROBIC BOTTLES CRITICAL RESULT CALLED TO, READ BACK BY AND VERIFIED WITH: Erie Y [id number] FCP Performed at [facility], [place] [address], [place], [place] [address]    Culture GROUP B STREP(S.AGALACTIAE)ISOLATED (A)  Final   Report Status 04/12/2022 FINAL  Final   Organism ID, Bacteria GROUP B STREP(S.AGALACTIAE)ISOLATED  Final      Susceptibility   Group b strep(s.agalactiae)isolated -  MIC*    CLINDAMYCIN >=1 RESISTANT Resistant     AMPICILLIN <=0.25 SENSITIVE Sensitive     ERYTHROMYCIN >=8 RESISTANT Resistant     VANCOMYCIN 0.5 SENSITIVE Sensitive     CEFTRIAXONE <=0.12 SENSITIVE Sensitive     LEVOFLOXACIN 1 SENSITIVE Sensitive     PENICILLIN <=0.06 SENSITIVE Sensitive     * GROUP B STREP(S.AGALACTIAE)ISOLATED  Blood Culture ID Panel (Reflexed)     Status: Abnormal   Collection Time: 04/09/22  5:24 PM  Result Value Ref Range Status   Enterococcus faecalis NOT DETECTED NOT DETECTED Final   Enterococcus Faecium NOT DETECTED NOT DETECTED Final   Listeria monocytogenes NOT DETECTED NOT DETECTED Final   Staphylococcus species NOT DETECTED NOT DETECTED Final   Staphylococcus aureus (BCID) NOT DETECTED NOT DETECTED Final   Staphylococcus epidermidis NOT DETECTED NOT DETECTED Final   Staphylococcus lugdunensis NOT DETECTED NOT DETECTED Final   Streptococcus species DETECTED (A) NOT DETECTED Final    Comment: CRITICAL RESULT CALLED TO, READ BACK BY AND VERIFIED WITH: PHARMD ELIZABETH M [id number] FCP    Streptococcus agalactiae  DETECTED (A) NOT DETECTED Final    Comment: CRITICAL RESULT CALLED TO, READ BACK BY AND VERIFIED WITH: PHARMD ELIZABETH M 1011 [id number] FCP    Streptococcus pneumoniae NOT DETECTED NOT DETECTED Final   Streptococcus pyogenes NOT DETECTED NOT DETECTED Final   A.calcoaceticus-baumannii NOT DETECTED NOT DETECTED Final   Bacteroides fragilis NOT DETECTED NOT DETECTED Final   Enterobacterales NOT DETECTED NOT DETECTED Final   Enterobacter cloacae complex NOT DETECTED NOT DETECTED Final   Escherichia coli NOT DETECTED NOT DETECTED Final   Klebsiella aerogenes NOT DETECTED NOT DETECTED Final   Klebsiella oxytoca NOT DETECTED NOT DETECTED Final   Klebsiella pneumoniae NOT DETECTED NOT DETECTED Final   Proteus species NOT DETECTED NOT DETECTED Final   Salmonella species NOT DETECTED NOT DETECTED Final   Serratia marcescens NOT DETECTED NOT DETECTED Final   Haemophilus influenzae NOT DETECTED NOT DETECTED Final   Neisseria meningitidis NOT DETECTED NOT DETECTED Final   Pseudomonas aeruginosa NOT DETECTED NOT DETECTED Final   Stenotrophomonas maltophilia NOT DETECTED NOT DETECTED Final   Candida albicans NOT DETECTED NOT DETECTED Final   Candida auris NOT DETECTED NOT DETECTED Final   Candida glabrata NOT DETECTED NOT DETECTED Final   Candida krusei NOT DETECTED NOT DETECTED Final   Candida parapsilosis NOT DETECTED NOT DETECTED Final   Candida tropicalis NOT DETECTED NOT DETECTED Final   Cryptococcus neoformans/gattii NOT DETECTED NOT DETECTED Final    Comment: Performed at [facility], [address], [place], [place] [address]  Culture, blood (routine x 2)     Status: Abnormal   Collection Time: 04/09/22  5:37 PM   Specimen: BLOOD LEFT HAND  Result Value Ref Range Status   Specimen Description BLOOD LEFT HAND  Final   Special Requests   Final    BOTTLES DRAWN AEROBIC AND ANAEROBIC Blood Culture adequate volume   Culture  Setup Time   Final    GRAM POSITIVE COCCI IN CHAINS IN BOTH  AEROBIC AND ANAEROBIC BOTTLES CRITICAL VALUE NOTED.  VALUE IS CONSISTENT WITH PREVIOUSLY REPORTED AND CALLED VALUE.    Culture (A)  Final    GROUP B STREP(S.AGALACTIAE)ISOLATED SUSCEPTIBILITIES PERFORMED ON PREVIOUS CULTURE WITHIN THE LAST 5 DAYS. Performed at [facility], [place] [address], [place], [place] [address]    Report Status 04/12/2022 FINAL  Final  MRSA Next Gen by PCR, Nasal  Status: None   Collection Time: 04/10/22  6:25 AM   Specimen: Nasal Mucosa; Nasal Swab  Result Value Ref Range Status   MRSA by PCR Next Gen NOT DETECTED NOT DETECTED Final    Comment: (NOTE) The GeneXpert MRSA Assay (FDA approved for NASAL specimens only), is one component of a comprehensive MRSA colonization surveillance program. It is not intended to diagnose MRSA infection nor to guide or monitor treatment for MRSA infections. Test performance is not FDA approved in patients less than 78 years old. Performed at [facility], [place] [address] [place], [place] [address]   Culture, blood (Routine X 2) w Reflex to ID Panel     Status: None (Preliminary result)   Collection Time: 04/10/22  1:33 PM   Specimen: BLOOD RIGHT HAND  Result Value Ref Range Status   Specimen Description BLOOD RIGHT HAND  Final   Special Requests   Final    BOTTLES DRAWN AEROBIC AND ANAEROBIC Blood Culture adequate volume   Culture   Final    NO GROWTH 2 DAYS Performed at [facility], [place] [address], [place], [place] [address]    Report Status PENDING  Incomplete      Studies: No results found.  Scheduled Meds:  aspirin EC  81 mg Oral Daily   atorvastatin  40 mg Oral Daily   divalproex  1,000 mg Oral QHS   dorzolamide  1 drop Right Eye BID   enoxaparin (LOVENOX) injection  40 mg Subcutaneous Q24H   gabapentin  300 mg Oral BID   latanoprost  1 drop Right Eye BID   melatonin  10 mg Oral QHS   mometasone-formoterol  2 puff Inhalation BID   pantoprazole  40 mg Oral BID   QUEtiapine  300 mg  Oral QHS   tamsulosin  0.4 mg Oral QHS   timolol  1 drop Right Eye BID    Continuous Infusions:  penicillin G potassium 12 Million Units in dextrose 5 % 500 mL continuous infusion 12 Million Units (04/13/22 0248)     LOS: 3 days     Phillips Climes, MD [facility]  If 7PM-7AM, please contact night-coverage [email or web address] 04/13/2022, 12:22 PM

## 2022-04-13 NOTE — Care Management Important Message (Signed)
Important Message  Patient Details  Name: Douglas Perkins. MRN: [id number] Date of Birth: 1945-06-20   Medicare Important Message Given:  Yes     Zierra Laroque 04/13/2022, 11:43 AM

## 2022-04-13 NOTE — Progress Notes (Signed)
Lower extremity venous duplex has been completed.   Preliminary results in CV Proc.   Heylee Tant Zayne Draheim 04/13/2022 2:10 PM

## 2022-04-14 ENCOUNTER — Inpatient Hospital Stay (HOSPITAL_COMMUNITY): Payer: Medicare Other | Admitting: Anesthesiology

## 2022-04-14 ENCOUNTER — Inpatient Hospital Stay (HOSPITAL_COMMUNITY): Payer: Medicare Other

## 2022-04-14 ENCOUNTER — Encounter (HOSPITAL_COMMUNITY): Payer: Self-pay | Admitting: Internal Medicine

## 2022-04-14 ENCOUNTER — Encounter (HOSPITAL_COMMUNITY)
Admission: EM | Disposition: A | Discharge status: Skilled Nursing Facility | Payer: Self-pay | Source: Home / Self Care | Attending: Internal Medicine

## 2022-04-14 DIAGNOSIS — T82897A Other specified complication of cardiac prosthetic devices, implants and grafts, initial encounter: Secondary | ICD-10-CM

## 2022-04-14 DIAGNOSIS — F319 Bipolar disorder, unspecified: Secondary | ICD-10-CM

## 2022-04-14 DIAGNOSIS — R7881 Bacteremia: Secondary | ICD-10-CM

## 2022-04-14 DIAGNOSIS — I10 Essential (primary) hypertension: Secondary | ICD-10-CM

## 2022-04-14 DIAGNOSIS — G4730 Sleep apnea, unspecified: Secondary | ICD-10-CM

## 2022-04-14 DIAGNOSIS — T827XXD Infection and inflammatory reaction due to other cardiac and vascular devices, implants and grafts, subsequent encounter: Secondary | ICD-10-CM | POA: Diagnosis not present

## 2022-04-14 DIAGNOSIS — C4A9 Merkel cell carcinoma, unspecified: Secondary | ICD-10-CM | POA: Diagnosis not present

## 2022-04-14 DIAGNOSIS — R079 Chest pain, unspecified: Secondary | ICD-10-CM

## 2022-04-14 DIAGNOSIS — T827XXS Infection and inflammatory reaction due to other cardiac and vascular devices, implants and grafts, sequela: Secondary | ICD-10-CM

## 2022-04-14 DIAGNOSIS — R8271 Bacteriuria: Secondary | ICD-10-CM | POA: Diagnosis not present

## 2022-04-14 HISTORY — PX: TEE WITHOUT CARDIOVERSION: SHX5443

## 2022-04-14 HISTORY — PX: BUBBLE STUDY: SHX6837

## 2022-04-14 LAB — CBC WITH DIFFERENTIAL/PLATELET
Abs Immature Granulocytes: 0.34 10*3/uL — ABNORMAL HIGH (ref 0.00–0.07)
Basophils Absolute: 0.1 10*3/uL (ref 0.0–0.1)
Basophils Relative: 1 %
Eosinophils Absolute: 0.3 10*3/uL (ref 0.0–0.5)
Eosinophils Relative: 3 %
HCT: 36.5 % — ABNORMAL LOW (ref 39.0–52.0)
Hemoglobin: 12.2 g/dL — ABNORMAL LOW (ref 13.0–17.0)
Immature Granulocytes: 4 %
Lymphocytes Relative: 20 %
Lymphs Abs: 1.9 10*3/uL (ref 0.7–4.0)
MCH: 31 pg (ref 26.0–34.0)
MCHC: 33.4 g/dL (ref 30.0–36.0)
MCV: 92.6 fL (ref 80.0–100.0)
Monocytes Absolute: 1.2 10*3/uL — ABNORMAL HIGH (ref 0.1–1.0)
Monocytes Relative: 13 %
Neutro Abs: 5.6 10*3/uL (ref 1.7–7.7)
Neutrophils Relative %: 59 %
Platelets: 259 10*3/uL (ref 150–400)
RBC: 3.94 MIL/uL — ABNORMAL LOW (ref 4.22–5.81)
RDW: 12.6 % (ref 11.5–15.5)
WBC: 9.5 10*3/uL (ref 4.0–10.5)
nRBC: 0 % (ref 0.0–0.2)

## 2022-04-14 LAB — BASIC METABOLIC PANEL
Anion gap: 10 (ref 5–15)
BUN: 11 mg/dL (ref 8–23)
CO2: 25 mmol/L (ref 22–32)
Calcium: 8.5 mg/dL — ABNORMAL LOW (ref 8.9–10.3)
Chloride: 96 mmol/L — ABNORMAL LOW (ref 98–111)
Creatinine, Ser: 0.66 mg/dL (ref 0.61–1.24)
GFR, Estimated: >60 mL/min (ref >60)
Glucose, Bld: 130 mg/dL — ABNORMAL HIGH (ref 70–99)
Potassium: 4 mmol/L (ref 3.5–5.1)
Sodium: 131 mmol/L — ABNORMAL LOW (ref 135–145)

## 2022-04-14 SURGERY — ECHOCARDIOGRAM, TRANSESOPHAGEAL
Anesthesia: Monitor Anesthesia Care

## 2022-04-14 MED ORDER — LIDOCAINE 2% (20 MG/ML) 5 ML SYRINGE
INTRAMUSCULAR | Status: DC | PRN
  Administered 2022-04-14: 60 mg via INTRAVENOUS
  Administered 2022-04-14: 40 mg via INTRAVENOUS

## 2022-04-14 MED ORDER — CARVEDILOL PHOSPHATE ER 20 MG PO CP24
80.0000 mg | ORAL_CAPSULE | Freq: Every day | ORAL | Status: DC
  Administered 2022-04-15 – 2022-04-16 (×2): 80 mg via ORAL
  Filled 2022-04-14: qty 1
  Filled 2022-04-14 (×3): qty 4

## 2022-04-14 MED ORDER — CARVEDILOL PHOSPHATE ER 40 MG PO CP24
60.0000 mg | ORAL_CAPSULE | Freq: Once | ORAL | Status: DC
  Filled 2022-04-14: qty 1

## 2022-04-14 MED ORDER — PROPOFOL 500 MG/50ML IV EMUL: INTRAVENOUS | Status: DC | PRN

## 2022-04-14 MED ORDER — SODIUM CHLORIDE 0.9 % IV SOLN: INTRAVENOUS | Status: DC

## 2022-04-14 MED ORDER — CARVEDILOL PHOSPHATE ER 20 MG PO CP24
60.0000 mg | ORAL_CAPSULE | Freq: Once | ORAL | Status: AC
  Administered 2022-04-14: 60 mg via ORAL
  Filled 2022-04-14: qty 3

## 2022-04-14 MED ORDER — PROPOFOL 10 MG/ML IV BOLUS
INTRAVENOUS | Status: DC | PRN
  Administered 2022-04-14: 25 mg via INTRAVENOUS
  Administered 2022-04-14: 15 mg via INTRAVENOUS
  Administered 2022-04-14: 35 mg via INTRAVENOUS

## 2022-04-14 MED ORDER — PHENYLEPHRINE 80 MCG/ML (10ML) SYRINGE FOR IV PUSH (FOR BLOOD PRESSURE SUPPORT)
PREFILLED_SYRINGE | INTRAVENOUS | Status: DC | PRN
  Administered 2022-04-14 (×3): 80 ug via INTRAVENOUS

## 2022-04-14 MED ORDER — PROPOFOL 500 MG/50ML IV EMUL
INTRAVENOUS | Status: DC | PRN
  Administered 2022-04-14: 125 ug/kg/min via INTRAVENOUS

## 2022-04-14 NOTE — Progress Notes (Signed)
EP BRIEF NOTE  04/14/22 4:59 PM  Patient had TEE today--images personally reviewed. There is thickening of the RA pacemaker with a mobile echodensity on the lead consistent with a vegetation, especially in setting of bacteremia. The patient is now interested in lead extraction to maximize his chances of long term freedom from infection. During 2 prior visits with the patient he was very clear that he was not interested in lead extraction.   His situation is complex. He has a gram + bacteremia with evidence of PPM involvement from today's TEE. There is  a Class I indication for lead extraction. He has a history of syncope, significant conduction system disease and sinus bradycardia requiring pacing so a reimplant will be necessary if his current system is removed.   The extraction procedure is tentatively scheduled for Monday afternoon. If we proceed, plan for CTS backup and it will be performed in hybrid OR. He will need to be NPO after MN on Sunday. He will need 4 units of PRBC's on standby.  If we proceed with extraction, his reimplant strategy will be difficult given his history of RLE radiation to the groin and presence of an Inspire device on the right chest. Options would include implant of a Micra leadless pacemaker from the left femoral vein, implant of a traditional transvenous pacemaker from the right side with removal of his inspire device or implant on the left chest after a sufficient treatment interval with IV antibiotics.  I will discuss this in more detail with the patient Monday morning in order to finalize our treatment plan.  Lysbeth Galas T. Quentin Ore, MD, [facility], [facility] Cardiac Electrophysiology

## 2022-04-14 NOTE — Anesthesia Preprocedure Evaluation (Signed)
Anesthesia Evaluation  Patient identified by MRN, date of birth, ID band Patient awake    Reviewed: Allergy & Precautions, H&P , NPO status , Patient's Chart, lab work & pertinent test results  Airway Mallampati: II   Neck ROM: full    Dental   Pulmonary sleep apnea    breath sounds clear to auscultation       Cardiovascular hypertension, + pacemaker  Rhythm:regular Rate:Normal  TTE (04/11/22): EF 60%   Neuro/Psych  PSYCHIATRIC DISORDERS   Bipolar Disorder   TIA   GI/Hepatic   Endo/Other    Renal/GU      Musculoskeletal   Abdominal   Peds  Hematology   Anesthesia Other Findings   Reproductive/Obstetrics                             Anesthesia Physical Anesthesia Plan  ASA: 3  Anesthesia Plan: MAC   Post-op Pain Management:    Induction: Intravenous  PONV Risk Score and Plan: 1 and Propofol infusion and Treatment may vary due to age or medical condition  Airway Management Planned: Nasal Cannula  Additional Equipment:   Intra-op Plan:   Post-operative Plan:   Informed Consent: I have reviewed the patients History and Physical, chart, labs and discussed the procedure including the risks, benefits and alternatives for the proposed anesthesia with the patient or authorized representative who has indicated his/her understanding and acceptance.     Dental advisory given  Plan Discussed with: CRNA, Anesthesiologist and Surgeon  Anesthesia Plan Comments:        Anesthesia Quick Evaluation

## 2022-04-14 NOTE — Progress Notes (Signed)
     Transesophageal Echocardiogram Note  Douglas Perkins [id number] 1945/08/10  Procedure: Transesophageal Echocardiogram Indications: Bacteremia  Procedure Details Consent: Obtained Time Out: Verified patient identification, verified procedure, site/side was marked, verified correct patient position, special equipment/implants available, Radiology Safety Procedures followed,  medications/allergies/relevent history reviewed, required imaging and test results available.  Performed  Medications:  Pt sedated by anesthesia with diprovan 400 mg IV total.  Normal LV function; aortic valve sclerosis; no AI; no valvular vegetations; oscillating density on RA lead concerning for vegetation.     Complications: No apparent complications Patient did tolerate procedure well.  Kirk Ruths, MD

## 2022-04-14 NOTE — Interval H&P Note (Signed)
History and Physical Interval Note:  04/14/2022 7:47 AM  Douglas Perkins.  has presented today for surgery, with the diagnosis of BACTEREMIA.  The various methods of treatment have been discussed with the patient and family. After consideration of risks, benefits and other options for treatment, the patient has consented to  Procedure(s): TRANSESOPHAGEAL ECHOCARDIOGRAM (TEE) (N/A) as a surgical intervention.  The patient's history has been reviewed, patient examined, no change in status, stable for surgery.  I have reviewed the patient's chart and labs.  Questions were answered to the patient's satisfaction.     Kirk Ruths

## 2022-04-14 NOTE — TOC Progression Note (Signed)
Transition of Care (TOC) - Progression Note    Patient Details  Name: Cosby Proby. MRN: [id number] Date of Birth: 1946-02-12  Transition of Care [facility]) CM/SW Contact  Tom-Rosselli, Renea Ee, RN Phone Number: 04/14/2022, 4:26 PM  Clinical Narrative:     CM notified by Carolynn Sayers with [facility] that she was contacted by ID for home IV abx at discharge. Patient is from [place]. CM spoke with patient at bedside and states he will be going for a procedure next week and then d/c to [facility]. States they have in-house PT right across from his room and will resume at discharge. CM spoke with Tommi Rumps at [facility] per patient's request for home health RN at discharge when ordered.  CM will continue to follow as patient progresses with care towards discharge.          Expected Discharge Plan and Services                                                 Social Determinants of Health (SDOH) Interventions    Readmission Risk Interventions     No data to display

## 2022-04-14 NOTE — Progress Notes (Signed)
PROGRESS NOTE  Douglas Perkins. EXB:[id number] DOB: [birth year] DOA: 04/09/2022 PCP: [name], MD  HPI/Recap of past 24 hours:  Douglas Perkins. is a 76 y.o. 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 Pt presents to ED from [place].  Was found on the ground after an unwitnessed fall. Was nauseous and diaphoretic and vomited once. Found to be altered initially and sats were around 90% so placed on nonrebreather. In the ED, noted to be septic, started on broad-spectrum antibiotics. Patient admitted for further management.    Subjective:  He reports some phlegm after procedure today,.   Assessment/Plan: Principal Problem:   Pacemaker infection ([place]) Active Problems:   SIRS (systemic inflammatory response syndrome) (HCC)   Benign essential hypertension   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HCC)   Sepsis (HCC)   Streptococcal bacteremia   Severe sepsis likely 2/2 Group B streptococcal bacteremia Pacemaker lead infection On admission leukocytosis, febrile, tachycardic, tachypneic, lactic acid of 3.5 Currently afebrile, with resolving leukocytosis, lactic acid resolved Procalcitonin elevated, trending down BC X 2 growing group B streptococcus, repeat pending UA negative CTA chest neg for PNA or PE CT A/p: Noted area of inflammation in right groin secondary to prior radiation therapy ID on board, appreciate recs TTE showed EF of 60 to 65%, no regional wall motion abnormality, recommend TEE scheduled for 12/15 (pacemaker) Cardiology and EP consulted, rec lead extraction,  ID narrowed broad-spectrum antibiotics to penicillin S/P IV fluids, BP stable -TTE today significant for mobile mass on atrial lead, indicating pacemaker lead infection, I have discussed with the patient, he is agreeable to extraction, management per EP.   Hypertension Blood pressure started to increase, as well as tachycardic, likely from  holding his beta-blockers, will increase him back to his home dose Coreg CD80 mg oral daily.  CAD/HLD Mildly elevated troponin-likely demand ischemia Echo noted as above Denies any chest pain Continue aspirin, Lipitor  Merkel cell carcinoma S/p radiation therapy CT showing area of inflammation in right groin RLE swollen with erythema, will order doppler  Obstructive sleep apnea Does have implantable inspire device  Obesity Lifestyle modification advised    Estimated body mass index is 38.09 kg/m as calculated from the following:   Height as of this encounter: 6' (1.829 m).   Weight as of this encounter: 127.4 kg.     Code Status: Full  Family Communication: None at bedside  Disposition Plan: Status is: Inpatient The patient will require care spanning > 2 midnights and should be moved to inpatient because: Level of care      Consultants: ID EP  Procedures: TEE 12/15  Antimicrobials: Penicillin G  DVT prophylaxis: Lovenox   Objective: Vitals:   04/14/22 0836 04/14/22 0846 04/14/22 1021 04/14/22 1223  BP: (!) 145/73 (!) 152/86 (!) 147/87 (!) 171/81  Pulse: (!) 102 (!) 105 95 84  Resp: (!) 21 (!) 23 (!) 25 20  Temp:   (!) 97.4 F (36.3 C) 97.7 F (36.5 C)  TempSrc:   Oral Oral  SpO2: 95% 95% 95% 94%  Weight:      Height:        Intake/Output Summary (Last 24 hours) at 04/14/2022 1351 Last data filed at 04/14/2022 0829 Gross per 24 hour  Intake 2954.6 ml  Output 800 ml  Net 2154.6 ml   Filed Weights   04/09/22 1727 04/11/22 1515  Weight: 97.5 kg 127.4 kg    Exam:  Awake Alert, Oriented X 3, No new F.N deficits, Normal affect Symmetrical Chest wall movement, Good air movement bilaterally, CTAB RRR,No Gallops,Rubs or new Murmurs, No Parasternal Heave +ve B.Sounds, Abd Soft, No tenderness, No rebound - guarding or rigidity. No Cyanosis, Clubbing or edema, No new Rash or bruise       Data Reviewed: CBC: Recent Labs  Lab  04/09/22 1737 04/09/22 1748 04/10/22 0500 04/11/22 0755 04/12/22 0452 04/13/22 0430 04/14/22 0307  WBC 20.4*  --  31.7* 21.1* 12.5* 10.0 9.5  NEUTROABS 17.5*  --   --  17.2* 9.3* 6.2 5.6  HGB 13.6   < > 12.0* 11.2* 11.2* 12.5* 12.2*  HCT 40.3   < > 35.0* 33.3* 32.3* 35.8* 36.5*  MCV 92.9  --  93.3 95.4 91.2 90.9 92.6  PLT 284  --  226 202 196 172 259   < > = values in this interval not displayed.   Basic Metabolic Panel: Recent Labs  Lab 04/10/22 0500 04/11/22 0755 04/12/22 0452 04/13/22 0430 04/14/22 0307  NA 134* 135 134* 134* 131*  K 3.8 3.9 4.0 4.5 4.0  CL 100 99 99 100 96*  CO2 '24 25 26 '$ 21* 25  GLUCOSE 150* 120* 128* 133* 130*  BUN '17 13 9 10 11  '$ CREATININE 0.88 0.58* 0.49* 0.62 0.66  CALCIUM 7.9* 8.0* 8.4* 8.6* 8.5*   GFR: Estimated Creatinine Clearance: 104.9 mL/min (by C-G formula based on SCr of 0.66 mg/dL). Liver Function Tests: Recent Labs  Lab 04/09/22 1737 04/10/22 0500  AST 33 56*  ALT 26 26  ALKPHOS 66 52  BILITOT 0.4 0.5  PROT 7.3 6.4*  ALBUMIN 3.5 2.8*   Recent Labs  Lab 04/09/22 1737  LIPASE 39   No results for input(s): "AMMONIA" in the last 168 hours. Coagulation Profile: Recent Labs  Lab 04/09/22 1737 04/10/22 0500  INR 1.1 1.2   Cardiac Enzymes: Recent Labs  Lab 04/09/22 2010  CKTOTAL 847*   BNP (last 3 results) No results for input(s): "PROBNP" in the last 8760 hours. HbA1C: No results for input(s): "HGBA1C" in the last 72 hours. CBG: No results for input(s): "GLUCAP" in the last 168 hours. Lipid Profile: No results for input(s): "CHOL", "HDL", "LDLCALC", "TRIG", "CHOLHDL", "LDLDIRECT" in the last 72 hours. Thyroid Function Tests: No results for input(s): "TSH", "T4TOTAL", "FREET4", "T3FREE", "THYROIDAB" in the last 72 hours. Anemia Panel: No results for input(s): "VITAMINB12", "FOLATE", "FERRITIN", "TIBC", "IRON", "RETICCTPCT" in the last 72 hours. Urine analysis:    Component Value Date/Time   COLORURINE YELLOW  04/09/2022 2135   APPEARANCEUR HAZY (A) 04/09/2022 2135   APPEARANCEUR Clear 05/24/2015 1328   LABSPEC 1.017 04/09/2022 2135   PHURINE 6.0 04/09/2022 2135   GLUCOSEU NEGATIVE 04/09/2022 2135   HGBUR SMALL (A) 04/09/2022 2135   BILIRUBINUR NEGATIVE 04/09/2022 2135   BILIRUBINUR Negative 05/24/2015 [place] 04/09/2022 2135   PROTEINUR 30 (A) 04/09/2022 2135   NITRITE NEGATIVE 04/09/2022 2135   LEUKOCYTESUR NEGATIVE 04/09/2022 2135   Sepsis Labs: '@LABRCNTIP'$ (procalcitonin:4,lacticidven:4)  ) Recent Results (from the past 240 hour(s))  Resp panel by RT-PCR (RSV, Flu A&B, Covid) Anterior Nasal Swab     Status: None   Collection Time: 04/09/22  5:18 PM   Specimen: Anterior Nasal Swab  Result Value Ref Range Status   SARS Coronavirus 2 by RT PCR NEGATIVE NEGATIVE Final    Comment: (NOTE) SARS-CoV-2 target nucleic acids are NOT DETECTED.  The SARS-CoV-2 RNA is generally detectable in upper respiratory  specimens during the acute phase of infection. The lowest concentration of SARS-CoV-2 viral copies this assay can detect is 138 copies/mL. A negative result does not preclude SARS-Cov-2 infection and should not be used as the sole basis for treatment or other patient management decisions. A negative result may occur with  improper specimen collection/handling, submission of specimen other than nasopharyngeal swab, presence of viral mutation(s) within the areas targeted by this assay, and inadequate number of viral copies(<138 copies/mL). A negative result must be combined with clinical observations, patient history, and epidemiological information. The expected result is Negative.  Fact Sheet for Patients:  EntrepreneurPulse.com.au  Fact Sheet for Healthcare Providers:  IncredibleEmployment.be  This test is no t yet approved or cleared by the [place] FDA and  has been authorized for detection and/or diagnosis of SARS-CoV-2  by FDA under an Emergency Use Authorization (EUA). This EUA will remain  in effect (meaning this test can be used) for the duration of the COVID-19 declaration under Section 564(b)(1) of the Act, 21 U.S.C.section 360bbb-3(b)(1), unless the authorization is terminated  or revoked sooner.       Influenza A by PCR NEGATIVE NEGATIVE Final   Influenza B by PCR NEGATIVE NEGATIVE Final    Comment: (NOTE) The Xpert Xpress SARS-CoV-2/FLU/RSV plus assay is intended as an aid in the diagnosis of influenza from Nasopharyngeal swab specimens and should not be used as a sole basis for treatment. Nasal washings and aspirates are unacceptable for Xpert Xpress SARS-CoV-2/FLU/RSV testing.  Fact Sheet for Patients: EntrepreneurPulse.com.au  Fact Sheet for Healthcare Providers: IncredibleEmployment.be  This test is not yet approved or cleared by the [place] FDA and has been authorized for detection and/or diagnosis of SARS-CoV-2 by FDA under an Emergency Use Authorization (EUA). This EUA will remain in effect (meaning this test can be used) for the duration of the COVID-19 declaration under Section 564(b)(1) of the Act, 21 U.S.C. section 360bbb-3(b)(1), unless the authorization is terminated or revoked.     Resp Syncytial Virus by PCR NEGATIVE NEGATIVE Final    Comment: (NOTE) Fact Sheet for Patients: EntrepreneurPulse.com.au  Fact Sheet for Healthcare Providers: IncredibleEmployment.be  This test is not yet approved or cleared by the [place] FDA and has been authorized for detection and/or diagnosis of SARS-CoV-2 by FDA under an Emergency Use Authorization (EUA). This EUA will remain in effect (meaning this test can be used) for the duration of the COVID-19 declaration under Section 564(b)(1) of the Act, 21 U.S.C. section 360bbb-3(b)(1), unless the authorization is terminated or revoked.  Performed at  [facility], [place] [address], [place], [place] [address]   Culture, blood (routine x 2)     Status: Abnormal   Collection Time: 04/09/22  5:24 PM   Specimen: BLOOD RIGHT HAND  Result Value Ref Range Status   Specimen Description BLOOD RIGHT HAND  Final   Special Requests   Final    BOTTLES DRAWN AEROBIC AND ANAEROBIC Blood Culture adequate volume   Culture  Setup Time   Final    GRAM POSITIVE COCCI IN CHAINS IN BOTH AEROBIC AND ANAEROBIC BOTTLES CRITICAL RESULT CALLED TO, READ BACK BY AND VERIFIED WITH: Biggers X [phone] FCP Performed at [facility], [place] [address], [place], [place] [address]    Culture GROUP B STREP(S.AGALACTIAE)ISOLATED (A)  Final   Report Status 04/12/2022 FINAL  Final   Organism ID, Bacteria GROUP B STREP(S.AGALACTIAE)ISOLATED  Final      Susceptibility   Group b  strep(s.agalactiae)isolated - MIC*    CLINDAMYCIN >=1 RESISTANT Resistant     AMPICILLIN <=0.25 SENSITIVE Sensitive     ERYTHROMYCIN >=8 RESISTANT Resistant     VANCOMYCIN 0.5 SENSITIVE Sensitive     CEFTRIAXONE <=0.12 SENSITIVE Sensitive     LEVOFLOXACIN 1 SENSITIVE Sensitive     PENICILLIN <=0.06 SENSITIVE Sensitive     * GROUP B STREP(S.AGALACTIAE)ISOLATED  Blood Culture ID Panel (Reflexed)     Status: Abnormal   Collection Time: 04/09/22  5:24 PM  Result Value Ref Range Status   Enterococcus faecalis NOT DETECTED NOT DETECTED Final   Enterococcus Faecium NOT DETECTED NOT DETECTED Final   Listeria monocytogenes NOT DETECTED NOT DETECTED Final   Staphylococcus species NOT DETECTED NOT DETECTED Final   Staphylococcus aureus (BCID) NOT DETECTED NOT DETECTED Final   Staphylococcus epidermidis NOT DETECTED NOT DETECTED Final   Staphylococcus lugdunensis NOT DETECTED NOT DETECTED Final   Streptococcus species DETECTED (A) NOT DETECTED Final    Comment: CRITICAL RESULT CALLED TO, READ BACK BY AND VERIFIED WITH: PHARMD ELIZABETH M [phone] FCP    Streptococcus agalactiae  DETECTED (A) NOT DETECTED Final    Comment: CRITICAL RESULT CALLED TO, READ BACK BY AND VERIFIED WITH: PHARMD ELIZABETH M [id number] FCP    Streptococcus pneumoniae NOT DETECTED NOT DETECTED Final   Streptococcus pyogenes NOT DETECTED NOT DETECTED Final   A.calcoaceticus-baumannii NOT DETECTED NOT DETECTED Final   Bacteroides fragilis NOT DETECTED NOT DETECTED Final   Enterobacterales NOT DETECTED NOT DETECTED Final   Enterobacter cloacae complex NOT DETECTED NOT DETECTED Final   Escherichia coli NOT DETECTED NOT DETECTED Final   Klebsiella aerogenes NOT DETECTED NOT DETECTED Final   Klebsiella oxytoca NOT DETECTED NOT DETECTED Final   Klebsiella pneumoniae NOT DETECTED NOT DETECTED Final   Proteus species NOT DETECTED NOT DETECTED Final   Salmonella species NOT DETECTED NOT DETECTED Final   Serratia marcescens NOT DETECTED NOT DETECTED Final   Haemophilus influenzae NOT DETECTED NOT DETECTED Final   Neisseria meningitidis NOT DETECTED NOT DETECTED Final   Pseudomonas aeruginosa NOT DETECTED NOT DETECTED Final   Stenotrophomonas maltophilia NOT DETECTED NOT DETECTED Final   Candida albicans NOT DETECTED NOT DETECTED Final   Candida auris NOT DETECTED NOT DETECTED Final   Candida glabrata NOT DETECTED NOT DETECTED Final   Candida krusei NOT DETECTED NOT DETECTED Final   Candida parapsilosis NOT DETECTED NOT DETECTED Final   Candida tropicalis NOT DETECTED NOT DETECTED Final   Cryptococcus neoformans/gattii NOT DETECTED NOT DETECTED Final    Comment: Performed at [facility], [address] [place], [place] [address]  Culture, blood (routine x 2)     Status: Abnormal   Collection Time: 04/09/22  5:37 PM   Specimen: BLOOD LEFT HAND  Result Value Ref Range Status   Specimen Description BLOOD LEFT HAND  Final   Special Requests   Final    BOTTLES DRAWN AEROBIC AND ANAEROBIC Blood Culture adequate volume   Culture  Setup Time   Final    GRAM POSITIVE COCCI IN CHAINS IN BOTH  AEROBIC AND ANAEROBIC BOTTLES CRITICAL VALUE NOTED.  VALUE IS CONSISTENT WITH PREVIOUSLY REPORTED AND CALLED VALUE.    Culture (A)  Final    GROUP B STREP(S.AGALACTIAE)ISOLATED SUSCEPTIBILITIES PERFORMED ON PREVIOUS CULTURE WITHIN THE LAST 5 DAYS. Performed at [facility], [place] [address] [place], [place] [address]    Report Status 04/12/2022 FINAL  Final  MRSA Next Gen by PCR, Nasal  Status: None   Collection Time: 04/10/22  6:25 AM   Specimen: Nasal Mucosa; Nasal Swab  Result Value Ref Range Status   MRSA by PCR Next Gen NOT DETECTED NOT DETECTED Final    Comment: (NOTE) The GeneXpert MRSA Assay (FDA approved for NASAL specimens only), is one component of a comprehensive MRSA colonization surveillance program. It is not intended to diagnose MRSA infection nor to guide or monitor treatment for MRSA infections. Test performance is not FDA approved in patients less than 56 years old. Performed at [facility], [place] [address] [place], [place] [address]   Culture, blood (Routine X 2) w Reflex to ID Panel     Status: None (Preliminary result)   Collection Time: 04/10/22  1:33 PM   Specimen: BLOOD RIGHT HAND  Result Value Ref Range Status   Specimen Description BLOOD RIGHT HAND  Final   Special Requests   Final    BOTTLES DRAWN AEROBIC AND ANAEROBIC Blood Culture adequate volume   Culture   Final    NO GROWTH 3 DAYS Performed at [facility], [place] [address] [place], [place] [address]    Report Status PENDING  Incomplete      Studies: ECHO TEE  Result Date: 04/14/2022    TRANSESOPHOGEAL ECHO REPORT   Patient Name:   Douglas Perkins. Date of Exam: 04/14/2022 Medical Rec #:  [id number]           Height:       72.0 in Accession #:    [id number]          Weight:       280.9 lb Date of Birth:  07-19-1945           BSA:          2.461 m Patient Age:    29 years            BP:           135/64 mmHg Patient Gender: M                   HR:           110 bpm. Exam  Location:  Inpatient Procedure: Transesophageal Echo and Color Doppler Indications:     Bacteremia  History:         Patient has prior history of Echocardiogram examinations, most                  recent 04/11/2022. Pacemaker and Abnormal ECG,                  Arrythmias:Bradycardia, Signs/Symptoms:Chest Pain and                  Bacteremia; Risk Factors:Hypertension.  Sonographer:     Roseanna Rainbow RDCS Referring Phys:  Kirk Ruths, S Diagnosing Phys: Kirk Ruths MD PROCEDURE: After discussion of the risks and benefits of a TEE, an informed consent was obtained from the patient. The transesophogeal probe was passed without difficulty through the esophogus of the patient. Imaged were obtained with the patient in a left lateral decubitus position. Sedation performed by different physician. The patient was monitored while under deep sedation. Anesthestetic sedation was provided intravenously by Anesthesiology: '400mg'$  of Propofol, '100mg'$  of Lidocaine. The patient's vital signs; including heart rate, blood pressure, and oxygen saturation; remained stable throughout the procedure. The patient developed no complications during the procedure.  IMPRESSIONS  1. No valvular vegetations noted; oscillating  density right atrial lead consistent with vegetation.  2. Left ventricular ejection fraction, by estimation, is 55 to 60%. The left ventricle has normal function. The left ventricle has no regional wall motion abnormalities.  3. Right ventricular systolic function is normal. The right ventricular size is normal.  4. No left atrial/left atrial appendage thrombus was detected.  5. The mitral valve is normal in structure. Trivial mitral valve regurgitation.  6. The aortic valve is tricuspid. Aortic valve regurgitation is not visualized. Aortic valve sclerosis is present, with no evidence of aortic valve stenosis.  7. There is Moderate (Grade III) plaque involving the descending aorta.  8. Agitated saline contrast bubble study  was negative, with no evidence of any interatrial shunt. FINDINGS  Left Ventricle: Left ventricular ejection fraction, by estimation, is 55 to 60%. The left ventricle has normal function. The left ventricle has no regional wall motion abnormalities. The left ventricular internal cavity size was normal in size. Right Ventricle: The right ventricular size is normal. Right ventricular systolic function is normal. Left Atrium: Left atrial size was normal in size. No left atrial/left atrial appendage thrombus was detected. Right Atrium: Right atrial size was normal in size. Pericardium: There is no evidence of pericardial effusion. Mitral Valve: The mitral valve is normal in structure. Trivial mitral valve regurgitation. Tricuspid Valve: The tricuspid valve is normal in structure. Tricuspid valve regurgitation is trivial. Aortic Valve: The aortic valve is tricuspid. Aortic valve regurgitation is not visualized. Aortic valve sclerosis is present, with no evidence of aortic valve stenosis. Pulmonic Valve: The pulmonic valve was normal in structure. Pulmonic valve regurgitation is not visualized. Aorta: The aortic root is normal in size and structure. There is moderate (Grade III) plaque involving the descending aorta. IAS/Shunts: The interatrial septum is aneurysmal. No atrial level shunt detected by color flow Doppler. Agitated saline contrast bubble study was negative, with no evidence of any interatrial shunt. Additional Comments: No valvular vegetations noted; oscillating density right atrial lead consistent with vegetation. A device lead is visualized. Kirk Ruths MD Electronically signed by Kirk Ruths MD Signature Date/Time: 04/14/2022/8:54:37 AM    Final    VAS [place] LOWER EXTREMITY VENOUS (DVT)  Result Date: 04/13/2022  Lower Venous DVT Study Patient Name:  Douglas Perkins.  Date of Exam:   04/13/2022 Medical Rec #: [id number]            Accession #:    [id number] Date of Birth: 10/06/1945             Patient Gender: M Patient Age:   8 years Exam Location:  [facility] Procedure:      VAS [place] LOWER EXTREMITY VENOUS (DVT) Referring Phys: [facility] [place] --------------------------------------------------------------------------------  Indications: Swelling, and Edema.  Comparison Study: no prior Performing Technologist: Archie Patten RVS  Examination Guidelines: A complete evaluation includes B-mode imaging, spectral Doppler, color Doppler, and power Doppler as needed of all accessible portions of each vessel. Bilateral testing is considered an integral part of a complete examination. Limited examinations for reoccurring indications may be performed as noted. The reflux portion of the exam is performed with the patient in reverse Trendelenburg.  +---------+---------------+---------+-----------+----------+--------------+ RIGHT    CompressibilityPhasicitySpontaneityPropertiesThrombus Aging +---------+---------------+---------+-----------+----------+--------------+ CFV      Full           Yes      Yes                                 +---------+---------------+---------+-----------+----------+--------------+  SFJ      Full                                                        +---------+---------------+---------+-----------+----------+--------------+ FV Prox  Full                                                        +---------+---------------+---------+-----------+----------+--------------+ FV Mid   Full                                                        +---------+---------------+---------+-----------+----------+--------------+ FV Distal               Yes      Yes                                 +---------+---------------+---------+-----------+----------+--------------+ PFV      Full                                                        +---------+---------------+---------+-----------+----------+--------------+ POP      Full           Yes      Yes                                  +---------+---------------+---------+-----------+----------+--------------+ PTV      Full                                                        +---------+---------------+---------+-----------+----------+--------------+ PERO     Full                                                        +---------+---------------+---------+-----------+----------+--------------+   +---------+---------------+---------+-----------+----------+--------------+ LEFT     CompressibilityPhasicitySpontaneityPropertiesThrombus Aging +---------+---------------+---------+-----------+----------+--------------+ CFV      Full           Yes      Yes                                 +---------+---------------+---------+-----------+----------+--------------+ SFJ      Full                                                        +---------+---------------+---------+-----------+----------+--------------+  FV Prox  Full                                                        +---------+---------------+---------+-----------+----------+--------------+ FV Mid   Full                                                        +---------+---------------+---------+-----------+----------+--------------+ FV DistalFull                                                        +---------+---------------+---------+-----------+----------+--------------+ PFV      Full                                                        +---------+---------------+---------+-----------+----------+--------------+ POP      Full           Yes      Yes                                 +---------+---------------+---------+-----------+----------+--------------+ PTV      Full                                                        +---------+---------------+---------+-----------+----------+--------------+ PERO     Full                                                         +---------+---------------+---------+-----------+----------+--------------+     Summary: BILATERAL: - No evidence of deep vein thrombosis seen in the lower extremities, bilaterally. -No evidence of popliteal cyst, bilaterally.   *See table(s) above for measurements and observations. Electronically signed by Monica Martinez MD on 04/13/2022 at 2:45:29 PM.    Final     Scheduled Meds:  aspirin EC  81 mg Oral Daily   atorvastatin  40 mg Oral Daily   carvedilol  20 mg Oral Daily   divalproex  1,000 mg Oral QHS   dorzolamide  1 drop Right Eye BID   enoxaparin (LOVENOX) injection  40 mg Subcutaneous Q24H   gabapentin  300 mg Oral BID   latanoprost  1 drop Right Eye BID   melatonin  10 mg Oral QHS   mometasone-formoterol  2 puff Inhalation BID   pantoprazole  40 mg Oral BID   QUEtiapine  300 mg Oral QHS   tamsulosin  0.4 mg Oral QHS   timolol  1 drop Right Eye BID  Continuous Infusions:  penicillin G potassium 12 Million Units in dextrose 5 % 500 mL continuous infusion 12 Million Units (04/14/22 1312)     LOS: 4 days     Phillips Climes, MD [facility]  If 7PM-7AM, please contact night-coverage [email or web address] 04/14/2022, 1:51 PM

## 2022-04-14 NOTE — Anesthesia Postprocedure Evaluation (Signed)
Anesthesia Post Note  Patient: Douglas Perkins.  Procedure(s) Performed: TRANSESOPHAGEAL ECHOCARDIOGRAM (TEE)     Patient location during evaluation: Endoscopy Anesthesia Type: MAC Level of consciousness: awake and alert Pain management: pain level controlled Vital Signs Assessment: post-procedure vital signs reviewed and stable Respiratory status: spontaneous breathing, nonlabored ventilation, respiratory function stable and patient connected to nasal cannula oxygen Cardiovascular status: stable and blood pressure returned to baseline Postop Assessment: no apparent nausea or vomiting Anesthetic complications: no   No notable events documented.  Last Vitals:  Vitals:   04/14/22 0836 04/14/22 0846  BP: (!) 145/73 (!) 152/86  Pulse: (!) 102 (!) 105  Resp: (!) 21 (!) 23  Temp:    SpO2: 95% 95%    Last Pain:  Vitals:   04/14/22 0846  TempSrc:   PainSc: 0-No pain                 Zachary Lovins S

## 2022-04-14 NOTE — Progress Notes (Signed)
  Echocardiogram Echocardiogram Transesophageal has been performed.  Douglas Perkins 04/14/2022, 8:28 AM

## 2022-04-14 NOTE — Progress Notes (Signed)
Subjective:  No complaints spitting up some saliva post TEE  Antibiotics:  Anti-infectives (From admission, onward)    Start     Dose/Rate Route Frequency Ordered Stop   04/10/22 2200  vancomycin (VANCOREADY) IVPB 2000 mg/400 mL  Status:  Discontinued        2,000 mg 200 mL/hr over 120 Minutes Intravenous Every 24 hours 04/09/22 2034 04/10/22 1020   04/10/22 1400  penicillin G potassium 12 Million Units in dextrose 5 % 500 mL continuous infusion        12 Million Units 41.7 mL/hr over 12 Hours Intravenous Every 12 hours 04/10/22 1020     04/10/22 0915  metroNIDAZOLE (FLAGYL) IVPB 500 mg  Status:  Discontinued        500 mg 100 mL/hr over 60 Minutes Intravenous Every 12 hours 04/10/22 0900 04/10/22 1020   04/10/22 0600  ceFEPIme (MAXIPIME) 2 g in sodium chloride 0.9 % 100 mL IVPB  Status:  Discontinued        2 g 200 mL/hr over 30 Minutes Intravenous Every 8 hours 04/09/22 2034 04/10/22 1020   04/09/22 1900  ceFEPIme (MAXIPIME) 2 g in sodium chloride 0.9 % 100 mL IVPB        2 g 200 mL/hr over 30 Minutes Intravenous  Once 04/09/22 1848 04/09/22 2040   04/09/22 1900  metroNIDAZOLE (FLAGYL) IVPB 500 mg        500 mg 100 mL/hr over 60 Minutes Intravenous  Once 04/09/22 1848 04/09/22 2006   04/09/22 1900  vancomycin (VANCOCIN) IVPB 1000 mg/200 mL premix  Status:  Discontinued        1,000 mg 200 mL/hr over 60 Minutes Intravenous  Once 04/09/22 1848 04/09/22 1850   04/09/22 1900  vancomycin (VANCOCIN) IVPB 1000 mg/200 mL premix       See Hyperspace for full Linked Orders Report.   1,000 mg 200 mL/hr over 60 Minutes Intravenous  Once 04/09/22 1850 04/09/22 2137   04/09/22 1900  vancomycin (VANCOCIN) IVPB 1000 mg/200 mL premix       See Hyperspace for full Linked Orders Report.   1,000 mg 200 mL/hr over 60 Minutes Intravenous  Once 04/09/22 1850 04/09/22 2241       Medications: Scheduled Meds:  aspirin EC  81 mg Oral Daily   atorvastatin  40 mg Oral Daily    carvedilol  20 mg Oral Daily   divalproex  1,000 mg Oral QHS   dorzolamide  1 drop Right Eye BID   enoxaparin (LOVENOX) injection  40 mg Subcutaneous Q24H   gabapentin  300 mg Oral BID   latanoprost  1 drop Right Eye BID   melatonin  10 mg Oral QHS   mometasone-formoterol  2 puff Inhalation BID   pantoprazole  40 mg Oral BID   QUEtiapine  300 mg Oral QHS   tamsulosin  0.4 mg Oral QHS   timolol  1 drop Right Eye BID   Continuous Infusions:  penicillin G potassium 12 Million Units in dextrose 5 % 500 mL continuous infusion 12 Million Units (04/14/22 0238)   PRN Meds:.acetaminophen **OR** acetaminophen, albuterol, ondansetron **OR** ondansetron (ZOFRAN) IV    Objective: Weight change:   Intake/Output Summary (Last 24 hours) at 04/14/2022 1055 Last data filed at 04/14/2022 0829 Gross per 24 hour  Intake 3194.6 ml  Output 800 ml  Net 2394.6 ml    Blood pressure (!) 147/87, pulse 95, temperature (!) 97.4 F (36.3 C), temperature source  Oral, resp. rate (!) 25, height 6' (1.829 m), weight 127.4 kg, SpO2 95 %. Temp:  [97.4 F (36.3 C)-98 F (36.7 C)] 97.4 F (36.3 C) (12/15 1021) Pulse Rate:  [81-108] 95 (12/15 1021) Resp:  [17-25] 25 (12/15 1021) BP: (125-163)/(61-87) 147/87 (12/15 1021) SpO2:  [92 %-96 %] 95 % (12/15 1021)  Physical Exam: Physical Exam Constitutional:      Appearance: He is well-developed.  HENT:     Head: Normocephalic and atraumatic.  Eyes:     Conjunctiva/sclera: Conjunctivae normal.  Cardiovascular:     Rate and Rhythm: Normal rate and regular rhythm.     Heart sounds: No murmur heard.    No friction rub. No gallop.  Pulmonary:     Effort: Pulmonary effort is normal. No respiratory distress.     Breath sounds: Normal breath sounds. No stridor. No wheezing or rhonchi.  Abdominal:     General: There is no distension.     Palpations: Abdomen is soft.  Musculoskeletal:        General: Normal range of motion.     Cervical back: Normal range of  motion and neck supple.  Skin:    General: Skin is warm and dry.     Findings: No erythema or rash.  Neurological:     General: No focal deficit present.     Mental Status: He is alert and oriented to person, place, and time.  Psychiatric:        Mood and Affect: Mood normal.        Behavior: Behavior normal.        Thought Content: Thought content normal.        Judgment: Judgment normal.      CBC:    BMET Recent Labs    04/13/22 0430 04/14/22 0307  NA 134* 131*  K 4.5 4.0  CL 100 96*  CO2 21* 25  GLUCOSE 133* 130*  BUN 10 11  CREATININE 0.62 0.66  CALCIUM 8.6* 8.5*      Liver Panel  No results for input(s): "PROT", "ALBUMIN", "AST", "ALT", "ALKPHOS", "BILITOT", "BILIDIR", "IBILI" in the last 72 hours.      Sedimentation Rate No results for input(s): "ESRSEDRATE" in the last 72 hours. C-Reactive Protein No results for input(s): "CRP" in the last 72 hours.  Micro Results: Recent Results (from the past 720 hour(s))  Resp panel by RT-PCR (RSV, Flu A&B, Covid) Anterior Nasal Swab     Status: None   Collection Time: 04/09/22  5:18 PM   Specimen: Anterior Nasal Swab  Result Value Ref Range Status   SARS Coronavirus 2 by RT PCR NEGATIVE NEGATIVE Final    Comment: (NOTE) SARS-CoV-2 target nucleic acids are NOT DETECTED.  The SARS-CoV-2 RNA is generally detectable in upper respiratory specimens during the acute phase of infection. The lowest concentration of SARS-CoV-2 viral copies this assay can detect is 138 copies/mL. A negative result does not preclude SARS-Cov-2 infection and should not be used as the sole basis for treatment or other patient management decisions. A negative result may occur with  improper specimen collection/handling, submission of specimen other than nasopharyngeal swab, presence of viral mutation(s) within the areas targeted by this assay, and inadequate number of viral copies(<138 copies/mL). A negative result must be combined  with clinical observations, patient history, and epidemiological information. The expected result is Negative.  Fact Sheet for Patients:  EntrepreneurPulse.com.au  Fact Sheet for Healthcare Providers:  IncredibleEmployment.be  This test is no t yet approved  or cleared by the [place] and  has been authorized for detection and/or diagnosis of SARS-CoV-2 by FDA under an Emergency Use Authorization (EUA). This EUA will remain  in effect (meaning this test can be used) for the duration of the COVID-19 declaration under Section 564(b)(1) of the Act, 21 U.S.C.section 360bbb-3(b)(1), unless the authorization is terminated  or revoked sooner.       Influenza A by PCR NEGATIVE NEGATIVE Final   Influenza B by PCR NEGATIVE NEGATIVE Final    Comment: (NOTE) The Xpert Xpress SARS-CoV-2/FLU/RSV plus assay is intended as an aid in the diagnosis of influenza from Nasopharyngeal swab specimens and should not be used as a sole basis for treatment. Nasal washings and aspirates are unacceptable for Xpert Xpress SARS-CoV-2/FLU/RSV testing.  Fact Sheet for Patients: EntrepreneurPulse.com.au  Fact Sheet for Healthcare Providers: IncredibleEmployment.be  This test is not yet approved or cleared by the [place] FDA and has been authorized for detection and/or diagnosis of SARS-CoV-2 by FDA under an Emergency Use Authorization (EUA). This EUA will remain in effect (meaning this test can be used) for the duration of the COVID-19 declaration under Section 564(b)(1) of the Act, 21 U.S.C. section 360bbb-3(b)(1), unless the authorization is terminated or revoked.     Resp Syncytial Virus by PCR NEGATIVE NEGATIVE Final    Comment: (NOTE) Fact Sheet for Patients: EntrepreneurPulse.com.au  Fact Sheet for Healthcare Providers: IncredibleEmployment.be  This test is not yet approved  or cleared by the [place] FDA and has been authorized for detection and/or diagnosis of SARS-CoV-2 by FDA under an Emergency Use Authorization (EUA). This EUA will remain in effect (meaning this test can be used) for the duration of the COVID-19 declaration under Section 564(b)(1) of the Act, 21 U.S.C. section 360bbb-3(b)(1), unless the authorization is terminated or revoked.  Performed at [facility], [place] [address] [place], [place] [address]   Culture, blood (routine x 2)     Status: Abnormal   Collection Time: 04/09/22  5:24 PM   Specimen: BLOOD RIGHT HAND  Result Value Ref Range Status   Specimen Description BLOOD RIGHT HAND  Final   Special Requests   Final    BOTTLES DRAWN AEROBIC AND ANAEROBIC Blood Culture adequate volume   Culture  Setup Time   Final    GRAM POSITIVE COCCI IN CHAINS IN BOTH AEROBIC AND ANAEROBIC BOTTLES CRITICAL RESULT CALLED TO, READ BACK BY AND VERIFIED WITH: [place] Y [phone] [id number] FCP Performed at [facility], [place] [address], [place], [place] [address]    Culture GROUP B STREP(S.AGALACTIAE)ISOLATED (A)  Final   Report Status 04/12/2022 FINAL  Final   Organism ID, Bacteria GROUP B STREP(S.AGALACTIAE)ISOLATED  Final      Susceptibility   Group b strep(s.agalactiae)isolated - MIC*    CLINDAMYCIN >=1 RESISTANT Resistant     AMPICILLIN <=0.25 SENSITIVE Sensitive     ERYTHROMYCIN >=8 RESISTANT Resistant     VANCOMYCIN 0.5 SENSITIVE Sensitive     CEFTRIAXONE <=0.12 SENSITIVE Sensitive     LEVOFLOXACIN 1 SENSITIVE Sensitive     PENICILLIN <=0.06 SENSITIVE Sensitive     * GROUP B STREP(S.AGALACTIAE)ISOLATED  Blood Culture ID Panel (Reflexed)     Status: Abnormal   Collection Time: 04/09/22  5:24 PM  Result Value Ref Range Status   Enterococcus faecalis NOT DETECTED NOT DETECTED Final   Enterococcus Faecium NOT DETECTED NOT DETECTED Final   Listeria monocytogenes NOT DETECTED NOT DETECTED Final   Staphylococcus species  NOT  DETECTED NOT DETECTED Final   Staphylococcus aureus (BCID) NOT DETECTED NOT DETECTED Final   Staphylococcus epidermidis NOT DETECTED NOT DETECTED Final   Staphylococcus lugdunensis NOT DETECTED NOT DETECTED Final   Streptococcus species DETECTED (A) NOT DETECTED Final    Comment: CRITICAL RESULT CALLED TO, READ BACK BY AND VERIFIED WITH: PHARMD ELIZABETH M [id number] FCP    Streptococcus agalactiae DETECTED (A) NOT DETECTED Final    Comment: CRITICAL RESULT CALLED TO, READ BACK BY AND VERIFIED WITH: PHARMD ELIZABETH M [id number] FCP    Streptococcus pneumoniae NOT DETECTED NOT DETECTED Final   Streptococcus pyogenes NOT DETECTED NOT DETECTED Final   A.calcoaceticus-baumannii NOT DETECTED NOT DETECTED Final   Bacteroides fragilis NOT DETECTED NOT DETECTED Final   Enterobacterales NOT DETECTED NOT DETECTED Final   Enterobacter cloacae complex NOT DETECTED NOT DETECTED Final   Escherichia coli NOT DETECTED NOT DETECTED Final   Klebsiella aerogenes NOT DETECTED NOT DETECTED Final   Klebsiella oxytoca NOT DETECTED NOT DETECTED Final   Klebsiella pneumoniae NOT DETECTED NOT DETECTED Final   Proteus species NOT DETECTED NOT DETECTED Final   Salmonella species NOT DETECTED NOT DETECTED Final   Serratia marcescens NOT DETECTED NOT DETECTED Final   Haemophilus influenzae NOT DETECTED NOT DETECTED Final   Neisseria meningitidis NOT DETECTED NOT DETECTED Final   Pseudomonas aeruginosa NOT DETECTED NOT DETECTED Final   Stenotrophomonas maltophilia NOT DETECTED NOT DETECTED Final   Candida albicans NOT DETECTED NOT DETECTED Final   Candida auris NOT DETECTED NOT DETECTED Final   Candida glabrata NOT DETECTED NOT DETECTED Final   Candida krusei NOT DETECTED NOT DETECTED Final   Candida parapsilosis NOT DETECTED NOT DETECTED Final   Candida tropicalis NOT DETECTED NOT DETECTED Final   Cryptococcus neoformans/gattii NOT DETECTED NOT DETECTED Final    Comment: Performed at [facility]  Lab, [address], [place], [place] [address]  Culture, blood (routine x 2)     Status: Abnormal   Collection Time: 04/09/22  5:37 PM   Specimen: BLOOD LEFT HAND  Result Value Ref Range Status   Specimen Description BLOOD LEFT HAND  Final   Special Requests   Final    BOTTLES DRAWN AEROBIC AND ANAEROBIC Blood Culture adequate volume   Culture  Setup Time   Final    GRAM POSITIVE COCCI IN CHAINS IN BOTH AEROBIC AND ANAEROBIC BOTTLES CRITICAL VALUE NOTED.  VALUE IS CONSISTENT WITH PREVIOUSLY REPORTED AND CALLED VALUE.    Culture (A)  Final    GROUP B STREP(S.AGALACTIAE)ISOLATED SUSCEPTIBILITIES PERFORMED ON PREVIOUS CULTURE WITHIN THE LAST 5 DAYS. Performed at [facility], [place] [address], [place], [place] [address]    Report Status 04/12/2022 FINAL  Final  MRSA Next Gen by PCR, Nasal     Status: None   Collection Time: 04/10/22  6:25 AM   Specimen: Nasal Mucosa; Nasal Swab  Result Value Ref Range Status   MRSA by PCR Next Gen NOT DETECTED NOT DETECTED Final    Comment: (NOTE) The GeneXpert MRSA Assay (FDA approved for NASAL specimens only), is one component of a comprehensive MRSA colonization surveillance program. It is not intended to diagnose MRSA infection nor to guide or monitor treatment for MRSA infections. Test performance is not FDA approved in patients less than 33 years old. Performed at [facility], [place] [address], [place], [place] [address]   Culture, blood (Routine X 2) w Reflex to ID Panel     Status: None (  Preliminary result)   Collection Time: 04/10/22  1:33 PM   Specimen: BLOOD RIGHT HAND  Result Value Ref Range Status   Specimen Description BLOOD RIGHT HAND  Final   Special Requests   Final    BOTTLES DRAWN AEROBIC AND ANAEROBIC Blood Culture adequate volume   Culture   Final    NO GROWTH 3 DAYS Performed at [facility], [address] [place], [place] [address]    Report Status PENDING  Incomplete    Studies/Results: ECHO  TEE  Result Date: 04/14/2022    TRANSESOPHOGEAL ECHO REPORT   Patient Name:   Garison Genova. Date of Exam: 04/14/2022 Medical Rec #:  [id number]           Height:       72.0 in Accession #:    [id number]          Weight:       280.9 lb Date of Birth:  04-25-1946           BSA:          2.461 m Patient Age:    76 years            BP:           135/64 mmHg Patient Gender: M                   HR:           110 bpm. Exam Location:  Inpatient Procedure: Transesophageal Echo and Color Doppler Indications:     Bacteremia  History:         Patient has prior history of Echocardiogram examinations, most                  recent 04/11/2022. Pacemaker and Abnormal ECG,                  Arrythmias:Bradycardia, Signs/Symptoms:Chest Pain and                  Bacteremia; Risk Factors:Hypertension.  Sonographer:     Roseanna Rainbow RDCS Referring Phys:  Kirk Ruths, S Diagnosing Phys: Kirk Ruths MD PROCEDURE: After discussion of the risks and benefits of a TEE, an informed consent was obtained from the patient. The transesophogeal probe was passed without difficulty through the esophogus of the patient. Imaged were obtained with the patient in a left lateral decubitus position. Sedation performed by different physician. The patient was monitored while under deep sedation. Anesthestetic sedation was provided intravenously by Anesthesiology: '400mg'$  of Propofol, '100mg'$  of Lidocaine. The patient's vital signs; including heart rate, blood pressure, and oxygen saturation; remained stable throughout the procedure. The patient developed no complications during the procedure.  IMPRESSIONS  1. No valvular vegetations noted; oscillating density right atrial lead consistent with vegetation.  2. Left ventricular ejection fraction, by estimation, is 55 to 60%. The left ventricle has normal function. The left ventricle has no regional wall motion abnormalities.  3. Right ventricular systolic function is normal. The right ventricular size is  normal.  4. No left atrial/left atrial appendage thrombus was detected.  5. The mitral valve is normal in structure. Trivial mitral valve regurgitation.  6. The aortic valve is tricuspid. Aortic valve regurgitation is not visualized. Aortic valve sclerosis is present, with no evidence of aortic valve stenosis.  7. There is Moderate (Grade III) plaque involving the descending aorta.  8. Agitated saline contrast bubble study was negative, with no evidence of any  interatrial shunt. FINDINGS  Left Ventricle: Left ventricular ejection fraction, by estimation, is 55 to 60%. The left ventricle has normal function. The left ventricle has no regional wall motion abnormalities. The left ventricular internal cavity size was normal in size. Right Ventricle: The right ventricular size is normal. Right ventricular systolic function is normal. Left Atrium: Left atrial size was normal in size. No left atrial/left atrial appendage thrombus was detected. Right Atrium: Right atrial size was normal in size. Pericardium: There is no evidence of pericardial effusion. Mitral Valve: The mitral valve is normal in structure. Trivial mitral valve regurgitation. Tricuspid Valve: The tricuspid valve is normal in structure. Tricuspid valve regurgitation is trivial. Aortic Valve: The aortic valve is tricuspid. Aortic valve regurgitation is not visualized. Aortic valve sclerosis is present, with no evidence of aortic valve stenosis. Pulmonic Valve: The pulmonic valve was normal in structure. Pulmonic valve regurgitation is not visualized. Aorta: The aortic root is normal in size and structure. There is moderate (Grade III) plaque involving the descending aorta. IAS/Shunts: The interatrial septum is aneurysmal. No atrial level shunt detected by color flow Doppler. Agitated saline contrast bubble study was negative, with no evidence of any interatrial shunt. Additional Comments: No valvular vegetations noted; oscillating density right atrial lead  consistent with vegetation. A device lead is visualized. Kirk Ruths MD Electronically signed by Kirk Ruths MD Signature Date/Time: 04/14/2022/8:54:37 AM    Final    VAS [place] LOWER EXTREMITY VENOUS (DVT)  Result Date: 04/13/2022  Lower Venous DVT Study Patient Name:  Douglas Perkins.  Date of Exam:   04/13/2022 Medical Rec #: [id number]            Accession #:    [id number] Date of Birth: Sep 13, 1945            Patient Gender: M Patient Age:   31 years Exam Location:  [facility] Procedure:      VAS [place] LOWER EXTREMITY VENOUS (DVT) Referring Phys: [facility] EZENDUKA --------------------------------------------------------------------------------  Indications: Swelling, and Edema.  Comparison Study: no prior Performing Technologist: Archie Patten RVS  Examination Guidelines: A complete evaluation includes B-mode imaging, spectral Doppler, color Doppler, and power Doppler as needed of all accessible portions of each vessel. Bilateral testing is considered an integral part of a complete examination. Limited examinations for reoccurring indications may be performed as noted. The reflux portion of the exam is performed with the patient in reverse Trendelenburg.  +---------+---------------+---------+-----------+----------+--------------+ RIGHT    CompressibilityPhasicitySpontaneityPropertiesThrombus Aging +---------+---------------+---------+-----------+----------+--------------+ CFV      Full           Yes      Yes                                 +---------+---------------+---------+-----------+----------+--------------+ SFJ      Full                                                        +---------+---------------+---------+-----------+----------+--------------+ FV Prox  Full                                                        +---------+---------------+---------+-----------+----------+--------------+  FV Mid   Full                                                         +---------+---------------+---------+-----------+----------+--------------+ FV Distal               Yes      Yes                                 +---------+---------------+---------+-----------+----------+--------------+ PFV      Full                                                        +---------+---------------+---------+-----------+----------+--------------+ POP      Full           Yes      Yes                                 +---------+---------------+---------+-----------+----------+--------------+ PTV      Full                                                        +---------+---------------+---------+-----------+----------+--------------+ PERO     Full                                                        +---------+---------------+---------+-----------+----------+--------------+   +---------+---------------+---------+-----------+----------+--------------+ LEFT     CompressibilityPhasicitySpontaneityPropertiesThrombus Aging +---------+---------------+---------+-----------+----------+--------------+ CFV      Full           Yes      Yes                                 +---------+---------------+---------+-----------+----------+--------------+ SFJ      Full                                                        +---------+---------------+---------+-----------+----------+--------------+ FV Prox  Full                                                        +---------+---------------+---------+-----------+----------+--------------+ FV Mid   Full                                                        +---------+---------------+---------+-----------+----------+--------------+  FV DistalFull                                                        +---------+---------------+---------+-----------+----------+--------------+ PFV      Full                                                         +---------+---------------+---------+-----------+----------+--------------+ POP      Full           Yes      Yes                                 +---------+---------------+---------+-----------+----------+--------------+ PTV      Full                                                        +---------+---------------+---------+-----------+----------+--------------+ PERO     Full                                                        +---------+---------------+---------+-----------+----------+--------------+     Summary: BILATERAL: - No evidence of deep vein thrombosis seen in the lower extremities, bilaterally. -No evidence of popliteal cyst, bilaterally.   *See table(s) above for measurements and observations. Electronically signed by Monica Martinez MD on 04/13/2022 at 2:45:29 PM.    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 is a 76 y.o. 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     LOS: 4 days   Alcide Evener 04/14/2022, 10:55 AM

## 2022-04-14 NOTE — Transfer of Care (Signed)
Immediate Anesthesia Transfer of Care Note  Patient: Douglas Perkins.  Procedure(s) Performed: TRANSESOPHAGEAL ECHOCARDIOGRAM (TEE)  Patient Location: PACU and Cath Lab  Anesthesia Type:MAC  Level of Consciousness: drowsy, patient cooperative, and responds to stimulation  Airway & Oxygen Therapy: Patient Spontanous Breathing and Patient connected to nasal cannula oxygen  Post-op Assessment: Report given to RN and Post -op Vital signs reviewed and stable  Post vital signs: Reviewed and stable  Last Vitals:  Vitals Value Taken Time  BP    Temp    Pulse    Resp    SpO2      Last Pain:  Vitals:   04/14/22 0742  TempSrc: Temporal  PainSc: 0-No pain      Patients Stated Pain Goal: 3 (62/22/97 9892)  Complications: No notable events documented.

## 2022-04-14 NOTE — Anesthesia Procedure Notes (Signed)
Procedure Name: MAC Date/Time: 04/14/2022 7:58 AM  Performed by: Janace Litten, CRNAPre-anesthesia Checklist: Patient identified, Emergency Drugs available, Suction available and Patient being monitored Patient Re-evaluated:Patient Re-evaluated prior to induction Oxygen Delivery Method: Nasal cannula

## 2022-04-14 NOTE — Plan of Care (Signed)

## 2022-04-15 DIAGNOSIS — T827XXS Infection and inflammatory reaction due to other cardiac and vascular devices, implants and grafts, sequela: Secondary | ICD-10-CM | POA: Diagnosis not present

## 2022-04-15 DIAGNOSIS — A401 Sepsis due to streptococcus, group B: Secondary | ICD-10-CM | POA: Diagnosis not present

## 2022-04-15 LAB — BASIC METABOLIC PANEL
Anion gap: 9 (ref 5–15)
BUN: 9 mg/dL (ref 8–23)
CO2: 26 mmol/L (ref 22–32)
Calcium: 8.7 mg/dL — ABNORMAL LOW (ref 8.9–10.3)
Chloride: 97 mmol/L — ABNORMAL LOW (ref 98–111)
Creatinine, Ser: 0.6 mg/dL — ABNORMAL LOW (ref 0.61–1.24)
GFR, Estimated: >60 mL/min (ref >60)
Glucose, Bld: 140 mg/dL — ABNORMAL HIGH (ref 70–99)
Potassium: 4.1 mmol/L (ref 3.5–5.1)
Sodium: 132 mmol/L — ABNORMAL LOW (ref 135–145)

## 2022-04-15 LAB — CBC WITH DIFFERENTIAL/PLATELET
Abs Immature Granulocytes: 0.41 10*3/uL — ABNORMAL HIGH (ref 0.00–0.07)
Basophils Absolute: 0.1 10*3/uL (ref 0.0–0.1)
Basophils Relative: 1 %
Eosinophils Absolute: 0.3 10*3/uL (ref 0.0–0.5)
Eosinophils Relative: 2 %
HCT: 37.3 % — ABNORMAL LOW (ref 39.0–52.0)
Hemoglobin: 12.6 g/dL — ABNORMAL LOW (ref 13.0–17.0)
Immature Granulocytes: 4 %
Lymphocytes Relative: 17 %
Lymphs Abs: 2 10*3/uL (ref 0.7–4.0)
MCH: 31 pg (ref 26.0–34.0)
MCHC: 33.8 g/dL (ref 30.0–36.0)
MCV: 91.6 fL (ref 80.0–100.0)
Monocytes Absolute: 1.4 10*3/uL — ABNORMAL HIGH (ref 0.1–1.0)
Monocytes Relative: 12 %
Neutro Abs: 7.7 10*3/uL (ref 1.7–7.7)
Neutrophils Relative %: 64 %
Platelets: 302 10*3/uL (ref 150–400)
RBC: 4.07 MIL/uL — ABNORMAL LOW (ref 4.22–5.81)
RDW: 12.8 % (ref 11.5–15.5)
WBC: 11.7 10*3/uL — ABNORMAL HIGH (ref 4.0–10.5)
nRBC: 0 % (ref 0.0–0.2)

## 2022-04-15 MED ORDER — POLYETHYLENE GLYCOL 3350 17 G PO PACK
17.0000 g | PACK | Freq: Two times a day (BID) | ORAL | Status: AC
  Administered 2022-04-15: 17 g via ORAL
  Filled 2022-04-15 (×2): qty 1

## 2022-04-15 NOTE — Progress Notes (Signed)
PROGRESS NOTE  Douglas Perkins. ZOX:[id number] DOB: 07-28-1945 DOA: 04/09/2022 PCP: [name], MD  HPI/Recap of past 24 hours:  Douglas Perkins. is a 76 y.o. 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 Pt presents to ED from [place].  Was found on the ground after an unwitnessed fall. Was nauseous and diaphoretic and vomited once. Found to be altered initially and sats were around 90% so placed on nonrebreather. In the ED, noted to be septic, started on broad-spectrum antibiotics. Patient admitted for further management.    Subjective:  No significant events overnight, he denies any complaints today..   Assessment/Plan: Principal Problem:   Pacemaker infection ([place]) Active Problems:   SIRS (systemic inflammatory response syndrome) (HCC)   Benign essential hypertension   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HCC)   Sepsis (HCC)   Streptococcal bacteremia   Severe sepsis likely 2/2 Group B streptococcal bacteremia Pacemaker lead infection On admission leukocytosis, febrile, tachycardic, tachypneic, lactic acid of 3.5 Currently afebrile, with resolving leukocytosis, lactic acid resolved Procalcitonin elevated, trending down BC X 2 growing group B streptococcus, repeat pending UA negative CTA chest neg for PNA or PE CT A/p: Noted area of inflammation in right groin secondary to prior radiation therapy ID on board, appreciate recs TTE showed EF of 60 to 65%, no regional wall motion abnormality, recommend TEE scheduled for 12/15 (pacemaker) Cardiology and EP consulted, rec lead extraction,  ID narrowed broad-spectrum antibiotics to penicillin S/P IV fluids, BP stable -TEE 12/15  significant for mobile mass on atrial lead, indicating pacemaker lead infection, I have discussed with the patient, he is agreeable to extraction, management per EP. Likely  on Monday by ED.   Hypertension -Coreg CAD initially on hold  given soft blood pressure, resumed currently.   CAD/HLD Mildly elevated troponin-likely demand ischemia Echo noted as above Denies any chest pain Continue aspirin, Lipitor  Merkel cell carcinoma S/p radiation therapy CT showing area of inflammation in right groin RLE swollen with erythema, will order doppler  Obstructive sleep apnea Does have implantable inspire device  Obesity Lifestyle modification advised    Estimated body mass index is 38.09 kg/m as calculated from the following:   Height as of this encounter: 6' (1.829 m).   Weight as of this encounter: 127.4 kg.     Code Status: Full  Family Communication: None at bedside  Disposition Plan: Status is: Inpatient The patient will require care spanning > 2 midnights and should be moved to inpatient because: Level of care      Consultants: ID EP  Procedures: TEE 12/15  Antimicrobials: Penicillin G  DVT prophylaxis: Lovenox   Objective: Vitals:   04/15/22 0000 04/15/22 0200 04/15/22 0400 04/15/22 0600  BP: (!) 148/76  (!) 146/78 136/76  Pulse: 95 91 (!) 101 83  Resp: 20 (!) 21 18 (!) 25  Temp: 97.7 F (36.5 C)  98.2 F (36.8 C) 97.7 F (36.5 C)  TempSrc: Oral  Oral Oral  SpO2: 94% 93% 94% 95%  Weight:      Height:        Intake/Output Summary (Last 24 hours) at 04/15/2022 1207 Last data filed at 04/15/2022 0600 Gross per 24 hour  Intake 1620.5 ml  Output 2750 ml  Net -1129.5 ml   Filed Weights   04/09/22 1727 04/11/22 1515  Weight: 97.5 kg 127.4 kg    Exam:   Awake Alert, Oriented X 3, frail,  deconditioned. Symmetrical Chest wall movement, Good air movement bilaterally, CTAB RRR,No Gallops,Rubs or new Murmurs, No Parasternal Heave +ve B.Sounds, Abd Soft, No tenderness, No rebound - guarding or rigidity. No Cyanosis, Clubbing or edema, No new Rash or bruise       Data Reviewed: CBC: Recent Labs  Lab 04/11/22 0755 04/12/22 0452 04/13/22 0430 04/14/22 0307 04/15/22 0217   WBC 21.1* 12.5* 10.0 9.5 11.7*  NEUTROABS 17.2* 9.3* 6.2 5.6 7.7  HGB 11.2* 11.2* 12.5* 12.2* 12.6*  HCT 33.3* 32.3* 35.8* 36.5* 37.3*  MCV 95.4 91.2 90.9 92.6 91.6  PLT 202 196 172 259 161   Basic Metabolic Panel: Recent Labs  Lab 04/11/22 0755 04/12/22 0452 04/13/22 0430 04/14/22 0307 04/15/22 0217  NA 135 134* 134* 131* 132*  K 3.9 4.0 4.5 4.0 4.1  CL 99 99 100 96* 97*  CO2 25 26 21* 25 26  GLUCOSE 120* 128* 133* 130* 140*  BUN '13 9 10 11 9  '$ CREATININE 0.58* 0.49* 0.62 0.66 0.60*  CALCIUM 8.0* 8.4* 8.6* 8.5* 8.7*   GFR: Estimated Creatinine Clearance: 104.9 mL/min (A) (by C-G formula based on SCr of 0.6 mg/dL (L)). Liver Function Tests: Recent Labs  Lab 04/09/22 1737 04/10/22 0500  AST 33 56*  ALT 26 26  ALKPHOS 66 52  BILITOT 0.4 0.5  PROT 7.3 6.4*  ALBUMIN 3.5 2.8*   Recent Labs  Lab 04/09/22 1737  LIPASE 39   No results for input(s): "AMMONIA" in the last 168 hours. Coagulation Profile: Recent Labs  Lab 04/09/22 1737 04/10/22 0500  INR 1.1 1.2   Cardiac Enzymes: Recent Labs  Lab 04/09/22 2010  CKTOTAL 847*   BNP (last 3 results) No results for input(s): "PROBNP" in the last 8760 hours. HbA1C: No results for input(s): "HGBA1C" in the last 72 hours. CBG: No results for input(s): "GLUCAP" in the last 168 hours. Lipid Profile: No results for input(s): "CHOL", "HDL", "LDLCALC", "TRIG", "CHOLHDL", "LDLDIRECT" in the last 72 hours. Thyroid Function Tests: No results for input(s): "TSH", "T4TOTAL", "FREET4", "T3FREE", "THYROIDAB" in the last 72 hours. Anemia Panel: No results for input(s): "VITAMINB12", "FOLATE", "FERRITIN", "TIBC", "IRON", "RETICCTPCT" in the last 72 hours. Urine analysis:    Component Value Date/Time   COLORURINE YELLOW 04/09/2022 2135   APPEARANCEUR HAZY (A) 04/09/2022 2135   APPEARANCEUR Clear 05/24/2015 1328   LABSPEC 1.017 04/09/2022 2135   PHURINE 6.0 04/09/2022 2135   GLUCOSEU NEGATIVE 04/09/2022 2135   HGBUR SMALL  (A) 04/09/2022 2135   BILIRUBINUR NEGATIVE 04/09/2022 2135   BILIRUBINUR Negative 05/24/2015 [place] 04/09/2022 2135   PROTEINUR 30 (A) 04/09/2022 2135   NITRITE NEGATIVE 04/09/2022 2135   LEUKOCYTESUR NEGATIVE 04/09/2022 2135   Sepsis Labs: '@LABRCNTIP'$ (procalcitonin:4,lacticidven:4)  ) Recent Results (from the past 240 hour(s))  Resp panel by RT-PCR (RSV, Flu A&B, Covid) Anterior Nasal Swab     Status: None   Collection Time: 04/09/22  5:18 PM   Specimen: Anterior Nasal Swab  Result Value Ref Range Status   SARS Coronavirus 2 by RT PCR NEGATIVE NEGATIVE Final    Comment: (NOTE) SARS-CoV-2 target nucleic acids are NOT DETECTED.  The SARS-CoV-2 RNA is generally detectable in upper respiratory specimens during the acute phase of infection. The lowest concentration of SARS-CoV-2 viral copies this assay can detect is 138 copies/mL. A negative result does not preclude SARS-Cov-2 infection and should not be used as the sole basis for treatment or other patient management decisions. A negative result may occur with  improper specimen collection/handling, submission of specimen other than nasopharyngeal swab, presence of viral mutation(s) within the areas targeted by this assay, and inadequate number of viral copies(<138 copies/mL). A negative result must be combined with clinical observations, patient history, and epidemiological information. The expected result is Negative.  Fact Sheet for Patients:  EntrepreneurPulse.com.au  Fact Sheet for Healthcare Providers:  IncredibleEmployment.be  This test is no t yet approved or cleared by the [place] FDA and  has been authorized for detection and/or diagnosis of SARS-CoV-2 by FDA under an Emergency Use Authorization (EUA). This EUA will remain  in effect (meaning this test can be used) for the duration of the COVID-19 declaration under Section 564(b)(1) of the Act,  21 U.S.C.section 360bbb-3(b)(1), unless the authorization is terminated  or revoked sooner.       Influenza A by PCR NEGATIVE NEGATIVE Final   Influenza B by PCR NEGATIVE NEGATIVE Final    Comment: (NOTE) The Xpert Xpress SARS-CoV-2/FLU/RSV plus assay is intended as an aid in the diagnosis of influenza from Nasopharyngeal swab specimens and should not be used as a sole basis for treatment. Nasal washings and aspirates are unacceptable for Xpert Xpress SARS-CoV-2/FLU/RSV testing.  Fact Sheet for Patients: EntrepreneurPulse.com.au  Fact Sheet for Healthcare Providers: IncredibleEmployment.be  This test is not yet approved or cleared by the [place] FDA and has been authorized for detection and/or diagnosis of SARS-CoV-2 by FDA under an Emergency Use Authorization (EUA). This EUA will remain in effect (meaning this test can be used) for the duration of the COVID-19 declaration under Section 564(b)(1) of the Act, 21 U.S.C. section 360bbb-3(b)(1), unless the authorization is terminated or revoked.     Resp Syncytial Virus by PCR NEGATIVE NEGATIVE Final    Comment: (NOTE) Fact Sheet for Patients: EntrepreneurPulse.com.au  Fact Sheet for Healthcare Providers: IncredibleEmployment.be  This test is not yet approved or cleared by the [place] FDA and has been authorized for detection and/or diagnosis of SARS-CoV-2 by FDA under an Emergency Use Authorization (EUA). This EUA will remain in effect (meaning this test can be used) for the duration of the COVID-19 declaration under Section 564(b)(1) of the Act, 21 U.S.C. section 360bbb-3(b)(1), unless the authorization is terminated or revoked.  Performed at [facility], [place] [address] [place], [place] [address]   Culture, blood (routine x 2)     Status: Abnormal   Collection Time: 04/09/22  5:24 PM   Specimen: BLOOD RIGHT HAND  Result Value  Ref Range Status   Specimen Description BLOOD RIGHT HAND  Final   Special Requests   Final    BOTTLES DRAWN AEROBIC AND ANAEROBIC Blood Culture adequate volume   Culture  Setup Time   Final    GRAM POSITIVE COCCI IN CHAINS IN BOTH AEROBIC AND ANAEROBIC BOTTLES CRITICAL RESULT CALLED TO, READ BACK BY AND VERIFIED WITH: [place] B [id number] FCP Performed at [facility], [place] [address] [place], [place] [address]    Culture GROUP B STREP(S.AGALACTIAE)ISOLATED (A)  Final   Report Status 04/12/2022 FINAL  Final   Organism ID, Bacteria GROUP B STREP(S.AGALACTIAE)ISOLATED  Final      Susceptibility   Group b strep(s.agalactiae)isolated - MIC*    CLINDAMYCIN >=1 RESISTANT Resistant     AMPICILLIN <=0.25 SENSITIVE Sensitive     ERYTHROMYCIN >=8 RESISTANT Resistant     VANCOMYCIN 0.5 SENSITIVE Sensitive     CEFTRIAXONE <=0.12 SENSITIVE Sensitive     LEVOFLOXACIN 1 SENSITIVE Sensitive  PENICILLIN <=0.06 SENSITIVE Sensitive     * GROUP B STREP(S.AGALACTIAE)ISOLATED  Blood Culture ID Panel (Reflexed)     Status: Abnormal   Collection Time: 04/09/22  5:24 PM  Result Value Ref Range Status   Enterococcus faecalis NOT DETECTED NOT DETECTED Final   Enterococcus Faecium NOT DETECTED NOT DETECTED Final   Listeria monocytogenes NOT DETECTED NOT DETECTED Final   Staphylococcus species NOT DETECTED NOT DETECTED Final   Staphylococcus aureus (BCID) NOT DETECTED NOT DETECTED Final   Staphylococcus epidermidis NOT DETECTED NOT DETECTED Final   Staphylococcus lugdunensis NOT DETECTED NOT DETECTED Final   Streptococcus species DETECTED (A) NOT DETECTED Final    Comment: CRITICAL RESULT CALLED TO, READ BACK BY AND VERIFIED WITH: PHARMD ELIZABETH M [id number] FCP    Streptococcus agalactiae DETECTED (A) NOT DETECTED Final    Comment: CRITICAL RESULT CALLED TO, READ BACK BY AND VERIFIED WITH: PHARMD ELIZABETH M [id number] FCP    Streptococcus pneumoniae NOT DETECTED NOT DETECTED  Final   Streptococcus pyogenes NOT DETECTED NOT DETECTED Final   A.calcoaceticus-baumannii NOT DETECTED NOT DETECTED Final   Bacteroides fragilis NOT DETECTED NOT DETECTED Final   Enterobacterales NOT DETECTED NOT DETECTED Final   Enterobacter cloacae complex NOT DETECTED NOT DETECTED Final   Escherichia coli NOT DETECTED NOT DETECTED Final   Klebsiella aerogenes NOT DETECTED NOT DETECTED Final   Klebsiella oxytoca NOT DETECTED NOT DETECTED Final   Klebsiella pneumoniae NOT DETECTED NOT DETECTED Final   Proteus species NOT DETECTED NOT DETECTED Final   Salmonella species NOT DETECTED NOT DETECTED Final   Serratia marcescens NOT DETECTED NOT DETECTED Final   Haemophilus influenzae NOT DETECTED NOT DETECTED Final   Neisseria meningitidis NOT DETECTED NOT DETECTED Final   Pseudomonas aeruginosa NOT DETECTED NOT DETECTED Final   Stenotrophomonas maltophilia NOT DETECTED NOT DETECTED Final   Candida albicans NOT DETECTED NOT DETECTED Final   Candida auris NOT DETECTED NOT DETECTED Final   Candida glabrata NOT DETECTED NOT DETECTED Final   Candida krusei NOT DETECTED NOT DETECTED Final   Candida parapsilosis NOT DETECTED NOT DETECTED Final   Candida tropicalis NOT DETECTED NOT DETECTED Final   Cryptococcus neoformans/gattii NOT DETECTED NOT DETECTED Final    Comment: Performed at [facility], [address] [place], [place] [address]  Culture, blood (routine x 2)     Status: Abnormal   Collection Time: 04/09/22  5:37 PM   Specimen: BLOOD LEFT HAND  Result Value Ref Range Status   Specimen Description BLOOD LEFT HAND  Final   Special Requests   Final    BOTTLES DRAWN AEROBIC AND ANAEROBIC Blood Culture adequate volume   Culture  Setup Time   Final    GRAM POSITIVE COCCI IN CHAINS IN BOTH AEROBIC AND ANAEROBIC BOTTLES CRITICAL VALUE NOTED.  VALUE IS CONSISTENT WITH PREVIOUSLY REPORTED AND CALLED VALUE.    Culture (A)  Final    GROUP B  STREP(S.AGALACTIAE)ISOLATED SUSCEPTIBILITIES PERFORMED ON PREVIOUS CULTURE WITHIN THE LAST 5 DAYS. Performed at [facility], [place] [address] [place], [place] [address]    Report Status 04/12/2022 FINAL  Final  MRSA Next Gen by PCR, Nasal     Status: None   Collection Time: 04/10/22  6:25 AM   Specimen: Nasal Mucosa; Nasal Swab  Result Value Ref Range Status   MRSA by PCR Next Gen NOT DETECTED NOT DETECTED Final    Comment: (NOTE) The GeneXpert MRSA Assay (FDA approved for NASAL specimens only), is one  component of a comprehensive MRSA colonization surveillance program. It is not intended to diagnose MRSA infection nor to guide or monitor treatment for MRSA infections. Test performance is not FDA approved in patients less than 33 years old. Performed at [facility], [place] [address] [place], [place] [address]   Culture, blood (Routine X 2) w Reflex to ID Panel     Status: None (Preliminary result)   Collection Time: 04/10/22  1:33 PM   Specimen: BLOOD RIGHT HAND  Result Value Ref Range Status   Specimen Description BLOOD RIGHT HAND  Final   Special Requests   Final    BOTTLES DRAWN AEROBIC AND ANAEROBIC Blood Culture adequate volume   Culture   Final    NO GROWTH 4 DAYS Performed at [facility], [place] [address], [place],  [address]    Report Status PENDING  Incomplete      Studies: No results found.  Scheduled Meds:  aspirin EC  81 mg Oral Daily   atorvastatin  40 mg Oral Daily   carvedilol  80 mg Oral Daily   divalproex  1,000 mg Oral QHS   dorzolamide  1 drop Right Eye BID   enoxaparin (LOVENOX) injection  40 mg Subcutaneous Q24H   gabapentin  300 mg Oral BID   latanoprost  1 drop Right Eye BID   melatonin  10 mg Oral QHS   mometasone-formoterol  2 puff Inhalation BID   pantoprazole  40 mg Oral BID   polyethylene glycol  17 g Oral BID   QUEtiapine  300 mg Oral QHS   tamsulosin  0.4 mg Oral QHS   timolol  1 drop Right Eye BID     Continuous Infusions:  penicillin G potassium 12 Million Units in dextrose 5 % 500 mL continuous infusion 41.7 mL/hr at 04/15/22 0600     LOS: 5 days     Phillips Climes, MD [facility]  If 7PM-7AM, please contact night-coverage [email or web address] 04/15/2022, 12:07 PM

## 2022-04-15 NOTE — Plan of Care (Signed)

## 2022-04-16 DIAGNOSIS — A401 Sepsis due to streptococcus, group B: Secondary | ICD-10-CM | POA: Diagnosis not present

## 2022-04-16 DIAGNOSIS — T827XXS Infection and inflammatory reaction due to other cardiac and vascular devices, implants and grafts, sequela: Secondary | ICD-10-CM | POA: Diagnosis not present

## 2022-04-16 LAB — CBC WITH DIFFERENTIAL/PLATELET
Abs Immature Granulocytes: 0.53 10*3/uL — ABNORMAL HIGH (ref 0.00–0.07)
Basophils Absolute: 0.1 10*3/uL (ref 0.0–0.1)
Basophils Relative: 1 %
Eosinophils Absolute: 0.3 10*3/uL (ref 0.0–0.5)
Eosinophils Relative: 3 %
HCT: 33.7 % — ABNORMAL LOW (ref 39.0–52.0)
Hemoglobin: 11.6 g/dL — ABNORMAL LOW (ref 13.0–17.0)
Immature Granulocytes: 5 %
Lymphocytes Relative: 17 %
Lymphs Abs: 2 10*3/uL (ref 0.7–4.0)
MCH: 31.4 pg (ref 26.0–34.0)
MCHC: 34.4 g/dL (ref 30.0–36.0)
MCV: 91.3 fL (ref 80.0–100.0)
Monocytes Absolute: 1.5 10*3/uL — ABNORMAL HIGH (ref 0.1–1.0)
Monocytes Relative: 13 %
Neutro Abs: 7.2 10*3/uL (ref 1.7–7.7)
Neutrophils Relative %: 61 %
Platelets: 295 10*3/uL (ref 150–400)
RBC: 3.69 MIL/uL — ABNORMAL LOW (ref 4.22–5.81)
RDW: 12.8 % (ref 11.5–15.5)
WBC: 11.6 10*3/uL — ABNORMAL HIGH (ref 4.0–10.5)
nRBC: 0 % (ref 0.0–0.2)

## 2022-04-16 LAB — PREPARE RBC (CROSSMATCH)

## 2022-04-16 LAB — CULTURE, BLOOD (ROUTINE X 2)
Culture: NO GROWTH
Special Requests: ADEQUATE

## 2022-04-16 MED ORDER — SODIUM CHLORIDE 0.9% IV SOLUTION: Freq: Once | INTRAVENOUS | Status: DC

## 2022-04-16 NOTE — Evaluation (Signed)
Occupational Therapy Evaluation Patient Details Name: Douglas Perkins. MRN: [id number] DOB: [birth year] Today's Date: 04/16/2022   History of Present Illness Pt presents from ILF with unwitnessed fall. Pt is now s/p TEE which revealed thickening of the RA pacemaker with a mobile echodensity on the lead consistent with a vegetation, especially in the setting of bacteremia. Per chart, pt interested in receiving lead extraction. 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   Clinical Impression   PTA, pt lived at [facility] where staff assisted with getting OOB, set-up at bed level for grooming, LB dressing, and IADL. Per pt he was able to walk using rollator without physical assistance. Upon eval, pt requires mod-max A for LB ADL and supervision for UB ADL. Pt coming to EOB with mod A; noting decreased core strength to elevate trunk. Pt standing at EOB with min A for rise and stedy and marching in place as well as taking side steps this session. Mod A to don slide in shoes. Pt with decreased activity tolerance, strength, and balance. Recommending HHOT at this time pending staff availability to provide light assist with transfers at ALF.      Recommendations for follow up therapy are one component of a multi-disciplinary discharge planning process, led by the attending physician.  Recommendations may be updated based on patient status, additional functional criteria and insurance authorization.   Follow Up Recommendations  Home health OT (pending progress or availability of staff to provide light assist with transfers at ALF)     Assistance Recommended at Discharge Intermittent Supervision/Assistance  Patient can return home with the following A little help with walking and/or transfers;A little help with bathing/dressing/bathroom;Assistance with cooking/housework;Direct supervision/assist for medications management;Assist for transportation;Help with stairs  or ramp for entrance    Functional Status Assessment  Patient has had a recent decline in their functional status and demonstrates the ability to make significant improvements in function in a reasonable and predictable amount of time.  Equipment Recommendations  None recommended by OT    Recommendations for Other Services       Precautions / Restrictions Precautions Precautions: Fall Restrictions Weight Bearing Restrictions: No      Mobility Bed Mobility Overal bed mobility: Needs Assistance Bed Mobility: Supine to Sit, Sit to Supine     Supine to sit: Mod assist, HOB elevated Sit to supine: Mod assist   General bed mobility comments: requires assist for management of feet and initially required assist to elevate trunk. (light assist for all aspects). HOB elevated    Transfers Overall transfer level: Needs assistance Equipment used: Rollator (4 wheels) Transfers: Sit to/from Stand Sit to Stand: Min assist           General transfer comment: Min A for rise. Pt was observed to count to prepare self for both scooting forward and for rise, using momentum during rise,      Balance Overall balance assessment: Needs assistance Sitting-balance support: Single extremity supported, Bilateral upper extremity supported, No upper extremity supported, Feet supported Sitting balance-Leahy Scale: Fair Sitting balance - Comments: supervision when EOB   Standing balance support: Bilateral upper extremity supported, During functional activity Standing balance-Leahy Scale: Poor Standing balance comment: reliant on RW and up to min external assist when marching                           ADL either performed or assessed with clinical  judgement   ADL Overall ADL's : Needs assistance/impaired Eating/Feeding: Modified independent;Bed level   Grooming: Oral care;Supervision/safety;Sitting Grooming Details (indicate cue type and reason): EOB; able to self-direct  care Upper Body Bathing: Set up;Sitting;Supervision/ safety   Lower Body Bathing: Minimal assistance;Sit to/from stand   Upper Body Dressing : Set up;Sitting;Supervision/safety   Lower Body Dressing: Maximal assistance;Sit to/from stand Lower Body Dressing Details (indicate cue type and reason): assist to don shoes, socks, and pants. Toilet Transfer: Minimal assistance;Stand-pivot;BSC/3in1;Rollator (4 wheels) Toilet Transfer Details (indicate cue type and reason): Min guard- min A         Functional mobility during ADLs: Min guard;Minimal assistance;Rolling walker (2 wheels) (marching in place and side steps this session on pt request) General ADL Comments: limited by generalized weakness and decreased activity tolerance.     Vision Baseline Vision/History: 1 Wears glasses Ability to See in Adequate Light: 0 Adequate Patient Visual Report: No change from baseline Vision Assessment?: No apparent visual deficits Additional Comments: No apparent visual deficits; looking across room when food servics arrived to ensure a certain item was not on tray     Perception Perception Perception Tested?: No   Praxis Praxis Praxis tested?: Not tested    Pertinent Vitals/Pain Pain Assessment Pain Assessment: Faces Faces Pain Scale: Hurts even more Pain Location: Groin from radiation years ago Pain Descriptors / Indicators: Discomfort, Sore, Grimacing, Guarding Pain Intervention(s): Limited activity within patient's tolerance, Monitored during session, Repositioned, Relaxation     Hand Dominance Right   Extremity/Trunk Assessment Upper Extremity Assessment Upper Extremity Assessment: Generalized weakness   Lower Extremity Assessment Lower Extremity Assessment: Defer to PT evaluation   Cervical / Trunk Assessment Cervical / Trunk Assessment: Kyphotic   Communication Communication Communication: No difficulties   Cognition Arousal/Alertness: Awake/alert Behavior During Therapy:  WFL for tasks assessed/performed Overall Cognitive Status: No family/caregiver present to determine baseline cognitive functioning                                 General Comments: Likely near baseline cognition. Very pleasant and conversational. Providing detailed description of how staff at [facility] assists him. Following all commands with increased time     General Comments  VSS. HR max of 120    Exercises     Shoulder Instructions      Home Living Family/patient expects to be discharged to:: Assisted living                             Home Equipment: Shower seat - built in;Rollator (4 wheels);Other (comment) (electric bed (HOB raises))   Additional Comments: Fitness sessions M, W, F. Staff provides meals, medication management, assist with getting OOB, LB dressing, laundry, light housekeeping.      Prior Functioning/Environment Prior Level of Function : Needs assist             Mobility Comments: uses rollator ADLs Comments: Fitness sessions M, W, F. Staff provides meals, medication management, assist with getting OOB, LB dressing, laundry, light housekeeping. Pt also reports that staff provides set-up for him to perform grooming tasks in his bed with [facility] elevated        OT Problem List: Decreased strength;Impaired balance (sitting and/or standing);Decreased activity tolerance;Decreased knowledge of use of DME or AE;Cardiopulmonary status limiting activity;Pain;Obesity      OT Treatment/Interventions: Self-care/ADL training;Therapeutic exercise;DME and/or AE instruction;Therapeutic activities;Patient/family education;Balance training  OT Goals(Current goals can be found in the care plan section) Acute Rehab OT Goals Patient Stated Goal: get stronger OT Goal Formulation: With patient Time For Goal Achievement: 04/30/22 Potential to Achieve Goals: Good  OT Frequency: Min 2X/week    Co-evaluation              AM-PAC OT "6  Clicks" Daily Activity     Outcome Measure Help from another person eating meals?: None Help from another person taking care of personal grooming?: A Little Help from another person toileting, which includes using toliet, bedpan, or urinal?: A Lot Help from another person bathing (including washing, rinsing, drying)?: A Little Help from another person to put on and taking off regular upper body clothing?: A Little Help from another person to put on and taking off regular lower body clothing?: A Lot 6 Click Score: 17   End of Session Equipment Utilized During Treatment: Gait belt;Rollator (4 wheels) Nurse Communication: Mobility status  Activity Tolerance: Patient tolerated treatment well Patient left: in bed;with call bell/phone within reach;with bed alarm set;Other (comment) (MD in room)  OT Visit Diagnosis: Unsteadiness on feet (R26.81);Muscle weakness (generalized) (M62.81);Other abnormalities of gait and mobility (R26.89);Pain Pain - Right/Left: Right Pain - part of body:  (groin)                Time: 0817-0900 OT Time Calculation (min): 43 min Charges:  OT General Charges $OT Visit: 1 Visit OT Evaluation $OT Eval Moderate Complexity: 1 Mod OT Treatments $Self Care/Home Management : 23-37 mins  Elder Cyphers, OTR/L [facility] Acute Rehabilitation Office: [phone]  Magnus Ivan 04/16/2022, 10:39 AM

## 2022-04-16 NOTE — Evaluation (Signed)
Physical Therapy Evaluation Patient Details Name: Douglas Perkins. MRN: [id number] DOB: 23-Apr-1946 Today's Date: 04/16/2022  History of Present Illness  Pt presents from ILF 12/10 with unwitnessed fall. Found to have Severe sepsis,  Pacemaker lead infection  .  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  Clinical Impression  Pt admitted with above diagnosis. Transfers with min guard assist. Agreeable to sit in chair for 1 hour. Able to stand from recliner with min guard and step pivot transfer with light UE support. Will benefit from re-assessment post op to insure he can ambulate safely to return to ALF (declined with PT but states he walked in room with OT earlier.)  Pt currently with functional limitations due to the deficits listed below (see PT Problem List). Pt will benefit from skilled PT to increase their independence and safety with mobility to allow discharge to the venue listed below.          Recommendations for follow up therapy are one component of a multi-disciplinary discharge planning process, led by the attending physician.  Recommendations may be updated based on patient status, additional functional criteria and insurance authorization.  Follow Up Recommendations Other (comment) (Return to ALF (will update to SNF if pt has any further signs of decline during admission.))      Assistance Recommended at Discharge Intermittent Supervision/Assistance  Patient can return home with the following  A little help with walking and/or transfers;A little help with bathing/dressing/bathroom;Assistance with cooking/housework;Assist for transportation    Equipment Recommendations None recommended by PT  Recommendations for Other Services       Functional Status Assessment Patient has had a recent decline in their functional status and demonstrates the ability to make significant improvements in function in a reasonable and predictable  amount of time.     Precautions / Restrictions Precautions Precautions: Fall Restrictions Weight Bearing Restrictions: No      Mobility  Bed Mobility Overal bed mobility: Needs Assistance Bed Mobility: Supine to Sit     Supine to sit: HOB elevated, Min guard     General bed mobility comments: Min guard, assist with lines/leads only. Pt maximized HOB height, declines attempting from flat bed surface similar to ALF set up.    Transfers Overall transfer level: Needs assistance Equipment used: Rollator (4 wheels) Transfers: Sit to/from Stand Sit to Stand: Min guard           General transfer comment: Min guard to stand from elevated bed surface and recliner. Cues for hand placement when rising. Pt rocks forwards for momentum. Once upright pt is steady and able to make pivot steps towards recliner with mild instability of RLE but able to self correct. Cues for sequencing. Assist to manage lines/leads only.    Ambulation/Gait               General Gait Details: declined  [employer] (Stroke Patients Only)       Balance Overall balance assessment: Needs assistance Sitting-balance support: Feet supported, No upper extremity supported Sitting balance-Leahy Scale: Fair Sitting balance - Comments: supervision when EOB   Standing balance support: Bilateral upper extremity supported, During functional activity Standing balance-Leahy Scale: Poor Standing balance comment: Stands with light support on walker  Pertinent Vitals/Pain Pain Assessment Pain Assessment: Faces Faces Pain Scale: Hurts even more Pain Location: Groin from radiation years ago Pain Descriptors / Indicators: Discomfort, Sore, Grimacing, Guarding Pain Intervention(s): Monitored during session, Repositioned    Home Living Family/patient expects to be discharged to:: Assisted living                  Home Equipment: Shower seat - built in;Rollator (4 wheels);Other (comment) (electric bed (HOB raises)) Additional Comments: Fitness sessions M, W, F. Staff provides meals, medication management, assist with getting OOB, LB dressing, laundry, light housekeeping. States he gets PT 3x/week    Prior Function Prior Level of Function : Needs assist             Mobility Comments: uses rollator walks short distances in ALF to dining room. ADLs Comments: Fitness sessions M, W, F. Staff provides meals, medication management, assist with getting OOB, LB dressing, laundry, light housekeeping. Pt also reports that staff provides set-up for him to perform grooming tasks in his bed with HOB elevated. Getting PT3x/week     Hand Dominance   Dominant Hand: Right    Extremity/Trunk Assessment   Upper Extremity Assessment Upper Extremity Assessment: Defer to OT evaluation         Cervical / Trunk Assessment Cervical / Trunk Assessment: Kyphotic  Communication   Communication: No difficulties  Cognition Arousal/Alertness: Awake/alert Behavior During Therapy: WFL for tasks assessed/performed Overall Cognitive Status: No family/caregiver present to determine baseline cognitive functioning                                 General Comments: Likely near baseline cognition. Very pleasant and conversational. Providing detailed description of how staff at [facility] assists him. Following all commands with increased time        General Comments General comments (skin integrity, edema, etc.): HR 89-100 during session. SpO2 95% on RA.    Exercises     Assessment/Plan    PT Assessment Patient needs continued PT services  PT Problem List Decreased strength;Decreased range of motion;Decreased activity tolerance;Decreased balance;Decreased mobility;Decreased knowledge of use of DME;Decreased safety awareness;Cardiopulmonary status limiting activity;Obesity       PT Treatment  Interventions DME instruction;Gait training;Functional mobility training;Therapeutic activities;Therapeutic exercise;Balance training;Neuromuscular re-education;Patient/family education    PT Goals (Current goals can be found in the Care Plan section)  Acute Rehab PT Goals Patient Stated Goal: Go back to ALF PT Goal Formulation: With patient Time For Goal Achievement: 04/30/22 Potential to Achieve Goals: Good    Frequency Min 3X/week     Co-evaluation               AM-PAC PT "6 Clicks" Mobility  Outcome Measure Help needed turning from your back to your side while in a flat bed without using bedrails?: A Little Help needed moving from lying on your back to sitting on the side of a flat bed without using bedrails?: A Little Help needed moving to and from a bed to a chair (including a wheelchair)?: A Little Help needed standing up from a chair using your arms (e.g., wheelchair or bedside chair)?: A Little Help needed to walk in hospital room?: A Lot Help needed climbing 3-5 steps with a railing? : A Lot 6 Click Score: 16    End of Session Equipment Utilized During Treatment: Gait belt Activity Tolerance: Patient tolerated treatment well Patient left: in chair;with call bell/phone within reach;with chair  alarm set Nurse Communication: Mobility status PT Visit Diagnosis: Unsteadiness on feet (R26.81);Other abnormalities of gait and mobility (R26.89);Muscle weakness (generalized) (M62.81);History of falling (Z91.81);Difficulty in walking, not elsewhere classified (R26.2)    Time: 3832-9191 PT Time Calculation (min) (ACUTE ONLY): 52 min   Charges:   PT Evaluation $PT Eval Low Complexity: 1 Low PT Treatments $Therapeutic Activity: 23-37 mins        Candie Mile, PT, DPT Physical Therapist Acute Rehabilitation Services [place]   Ellouise Newer 04/16/2022, 4:23 PM

## 2022-04-16 NOTE — Progress Notes (Signed)
PROGRESS NOTE  Douglas Perkins. HMC:[id number] DOB: 06/29/1945 DOA: 04/09/2022 PCP: [name], MD  HPI/Recap of past 24 hours:  Douglas Perkins. is a 76 y.o. 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 Pt presents to ED from [place].  Was found on the ground after an unwitnessed fall. Was nauseous and diaphoretic and vomited once. Found to be altered initially and sats were around 90% so placed on nonrebreather. In the ED, noted to be septic, started on broad-spectrum antibiotics. Patient admitted for further management.    Subjective:  No significant events overnight, he denies any complaints today.   Assessment/Plan: Principal Problem:   Pacemaker infection ([place]) Active Problems:   SIRS (systemic inflammatory response syndrome) (HCC)   Benign essential hypertension   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HCC)   Sepsis (HCC)   Streptococcal bacteremia   Severe sepsis likely 2/2 Group B streptococcal bacteremia Pacemaker lead infection On admission leukocytosis, febrile, tachycardic, tachypneic, lactic acid of 3.5 Currently afebrile, with resolving leukocytosis, lactic acid resolved Procalcitonin elevated, trending down BC X 2 growing group B streptococcus, repeat pending UA negative CTA chest neg for PNA or PE CT A/p: Noted area of inflammation in right groin secondary to prior radiation therapy ID on board, appreciate recs TTE showed EF of 60 to 65%, no regional wall motion abnormality, recommend TEE scheduled for 12/15 (pacemaker) Cardiology and EP consulted, rec lead extraction,  ID narrowed broad-spectrum antibiotics to penicillin S/P IV fluids, BP stable -TEE 12/15  significant for mobile mass on atrial lead, indicating pacemaker lead infection, I have discussed with the patient, he is agreeable to extraction, management per EP.  Plan for extraction possibly tomorrow per EP.   Hypertension -Core[place] CAD  initially on hold given soft blood pressure, resumed currently.   CAD/HLD Mildly elevated troponin-likely demand ischemia Echo noted as above Denies any chest pain Continue aspirin, Lipitor  Merkel cell carcinoma S/p radiation therapy CT showing area of inflammation in right groin RLE swollen with erythema, will order doppler  Obstructive sleep apnea Does have implantable inspire device  Obesity Lifestyle modification advised    Estimated body mass index is 38.09 kg/m as calculated from the following:   Height as of this encounter: 6' (1.829 m).   Weight as of this encounter: 127.4 kg.     Code Status: Full  Family Communication: None at bedside  Disposition Plan: Status is: Inpatient The patient will require care spanning > 2 midnights and should be moved to inpatient because: Level of care      Consultants: ID EP  Procedures: TEE 12/15  Antimicrobials: Penicillin G  DVT prophylaxis: Lovenox   Objective: Vitals:   04/16/22 0200 04/16/22 0400 04/16/22 0600 04/16/22 0800  BP:  (!) 114/55  (!) 141/80  Pulse: 79 79 80 86  Resp: '19 20 20 '$ (!) 24  Temp:  97.9 F (36.6 C)  97.9 F (36.6 C)  TempSrc:  Oral  Oral  SpO2: 94% 95% 93% 96%  Weight:      Height:        Intake/Output Summary (Last 24 hours) at 04/16/2022 1154 Last data filed at 04/16/2022 0600 Gross per 24 hour  Intake 1480.12 ml  Output 700 ml  Net 780.12 ml   Filed Weights   04/09/22 1727 04/11/22 1515  Weight: 97.5 kg 127.4 kg    Exam:   Awake Alert, Oriented X 3, frail Symmetrical Chest wall  movement, Good air movement bilaterally, CTAB RRR,No Gallops,Rubs or new Murmurs, No Parasternal Heave +ve B.Sounds, Abd Soft, No tenderness, No rebound - guarding or rigidity. No Cyanosis, Clubbing, chronic right lower extremity changes from previous radiations.     Data Reviewed: CBC: Recent Labs  Lab 04/12/22 0452 04/13/22 0430 04/14/22 0307 04/15/22 0217 04/16/22 0204   WBC 12.5* 10.0 9.5 11.7* 11.6*  NEUTROABS 9.3* 6.2 5.6 7.7 7.2  HGB 11.2* 12.5* 12.2* 12.6* 11.6*  HCT 32.3* 35.8* 36.5* 37.3* 33.7*  MCV 91.2 90.9 92.6 91.6 91.3  PLT 196 172 259 302 740   Basic Metabolic Panel: Recent Labs  Lab 04/11/22 0755 04/12/22 0452 04/13/22 0430 04/14/22 0307 04/15/22 0217  NA 135 134* 134* 131* 132*  K 3.9 4.0 4.5 4.0 4.1  CL 99 99 100 96* 97*  CO2 25 26 21* 25 26  GLUCOSE 120* 128* 133* 130* 140*  BUN '13 9 10 11 9  '$ CREATININE 0.58* 0.49* 0.62 0.66 0.60*  CALCIUM 8.0* 8.4* 8.6* 8.5* 8.7*   GFR: Estimated Creatinine Clearance: 104.9 mL/min (A) (by C-G formula based on SCr of 0.6 mg/dL (L)). Liver Function Tests: Recent Labs  Lab 04/09/22 1737 04/10/22 0500  AST 33 56*  ALT 26 26  ALKPHOS 66 52  BILITOT 0.4 0.5  PROT 7.3 6.4*  ALBUMIN 3.5 2.8*   Recent Labs  Lab 04/09/22 1737  LIPASE 39   No results for input(s): "AMMONIA" in the last 168 hours. Coagulation Profile: Recent Labs  Lab 04/09/22 1737 04/10/22 0500  INR 1.1 1.2   Cardiac Enzymes: Recent Labs  Lab 04/09/22 2010  CKTOTAL 847*   BNP (last 3 results) No results for input(s): "PROBNP" in the last 8760 hours. HbA1C: No results for input(s): "HGBA1C" in the last 72 hours. CBG: No results for input(s): "GLUCAP" in the last 168 hours. Lipid Profile: No results for input(s): "CHOL", "HDL", "LDLCALC", "TRIG", "CHOLHDL", "LDLDIRECT" in the last 72 hours. Thyroid Function Tests: No results for input(s): "TSH", "T4TOTAL", "FREET4", "T3FREE", "THYROIDAB" in the last 72 hours. Anemia Panel: No results for input(s): "VITAMINB12", "FOLATE", "FERRITIN", "TIBC", "IRON", "RETICCTPCT" in the last 72 hours. Urine analysis:    Component Value Date/Time   COLORURINE YELLOW 04/09/2022 2135   APPEARANCEUR HAZY (A) 04/09/2022 2135   APPEARANCEUR Clear 05/24/2015 1328   LABSPEC 1.017 04/09/2022 2135   PHURINE 6.0 04/09/2022 2135   GLUCOSEU NEGATIVE 04/09/2022 2135   HGBUR SMALL  (A) 04/09/2022 2135   BILIRUBINUR NEGATIVE 04/09/2022 2135   BILIRUBINUR Negative 05/24/2015 [place] 04/09/2022 2135   PROTEINUR 30 (A) 04/09/2022 2135   NITRITE NEGATIVE 04/09/2022 2135   LEUKOCYTESUR NEGATIVE 04/09/2022 2135   Sepsis Labs: '@LABRCNTIP'$ (procalcitonin:4,lacticidven:4)  ) Recent Results (from the past 240 hour(s))  Resp panel by RT-PCR (RSV, Flu A&B, Covid) Anterior Nasal Swab     Status: None   Collection Time: 04/09/22  5:18 PM   Specimen: Anterior Nasal Swab  Result Value Ref Range Status   SARS Coronavirus 2 by RT PCR NEGATIVE NEGATIVE Final    Comment: (NOTE) SARS-CoV-2 target nucleic acids are NOT DETECTED.  The SARS-CoV-2 RNA is generally detectable in upper respiratory specimens during the acute phase of infection. The lowest concentration of SARS-CoV-2 viral copies this assay can detect is 138 copies/mL. A negative result does not preclude SARS-Cov-2 infection and should not be used as the sole basis for treatment or other patient management decisions. A negative result may occur with  improper specimen collection/handling, submission  of specimen other than nasopharyngeal swab, presence of viral mutation(s) within the areas targeted by this assay, and inadequate number of viral copies(<138 copies/mL). A negative result must be combined with clinical observations, patient history, and epidemiological information. The expected result is Negative.  Fact Sheet for Patients:  EntrepreneurPulse.com.au  Fact Sheet for Healthcare Providers:  IncredibleEmployment.be  This test is no t yet approved or cleared by the [place] FDA and  has been authorized for detection and/or diagnosis of SARS-CoV-2 by FDA under an Emergency Use Authorization (EUA). This EUA will remain  in effect (meaning this test can be used) for the duration of the COVID-19 declaration under Section 564(b)(1) of the Act,  21 U.S.C.section 360bbb-3(b)(1), unless the authorization is terminated  or revoked sooner.       Influenza A by PCR NEGATIVE NEGATIVE Final   Influenza B by PCR NEGATIVE NEGATIVE Final    Comment: (NOTE) The Xpert Xpress SARS-CoV-2/FLU/RSV plus assay is intended as an aid in the diagnosis of influenza from Nasopharyngeal swab specimens and should not be used as a sole basis for treatment. Nasal washings and aspirates are unacceptable for Xpert Xpress SARS-CoV-2/FLU/RSV testing.  Fact Sheet for Patients: EntrepreneurPulse.com.au  Fact Sheet for Healthcare Providers: IncredibleEmployment.be  This test is not yet approved or cleared by the [place] FDA and has been authorized for detection and/or diagnosis of SARS-CoV-2 by FDA under an Emergency Use Authorization (EUA). This EUA will remain in effect (meaning this test can be used) for the duration of the COVID-19 declaration under Section 564(b)(1) of the Act, 21 U.S.C. section 360bbb-3(b)(1), unless the authorization is terminated or revoked.     Resp Syncytial Virus by PCR NEGATIVE NEGATIVE Final    Comment: (NOTE) Fact Sheet for Patients: EntrepreneurPulse.com.au  Fact Sheet for Healthcare Providers: IncredibleEmployment.be  This test is not yet approved or cleared by the [place] FDA and has been authorized for detection and/or diagnosis of SARS-CoV-2 by FDA under an Emergency Use Authorization (EUA). This EUA will remain in effect (meaning this test can be used) for the duration of the COVID-19 declaration under Section 564(b)(1) of the Act, 21 U.S.C. section 360bbb-3(b)(1), unless the authorization is terminated or revoked.  Performed at [facility], [place] [address] [place], [place] [address]   Culture, blood (routine x 2)     Status: Abnormal   Collection Time: 04/09/22  5:24 PM   Specimen: BLOOD RIGHT HAND  Result Value  Ref Range Status   Specimen Description BLOOD RIGHT HAND  Final   Special Requests   Final    BOTTLES DRAWN AEROBIC AND ANAEROBIC Blood Culture adequate volume   Culture  Setup Time   Final    GRAM POSITIVE COCCI IN CHAINS IN BOTH AEROBIC AND ANAEROBIC BOTTLES CRITICAL RESULT CALLED TO, READ BACK BY AND VERIFIED WITH: Sparkill H [id number] FCP Performed at [facility], [place] [address] [place], [place] [address]    Culture GROUP B STREP(S.AGALACTIAE)ISOLATED (A)  Final   Report Status 04/12/2022 FINAL  Final   Organism ID, Bacteria GROUP B STREP(S.AGALACTIAE)ISOLATED  Final      Susceptibility   Group b strep(s.agalactiae)isolated - MIC*    CLINDAMYCIN >=1 RESISTANT Resistant     AMPICILLIN <=0.25 SENSITIVE Sensitive     ERYTHROMYCIN >=8 RESISTANT Resistant     VANCOMYCIN 0.5 SENSITIVE Sensitive     CEFTRIAXONE <=0.12 SENSITIVE Sensitive     LEVOFLOXACIN 1 SENSITIVE Sensitive     PENICILLIN <=0.06  SENSITIVE Sensitive     * GROUP B STREP(S.AGALACTIAE)ISOLATED  Blood Culture ID Panel (Reflexed)     Status: Abnormal   Collection Time: 04/09/22  5:24 PM  Result Value Ref Range Status   Enterococcus faecalis NOT DETECTED NOT DETECTED Final   Enterococcus Faecium NOT DETECTED NOT DETECTED Final   Listeria monocytogenes NOT DETECTED NOT DETECTED Final   Staphylococcus species NOT DETECTED NOT DETECTED Final   Staphylococcus aureus (BCID) NOT DETECTED NOT DETECTED Final   Staphylococcus epidermidis NOT DETECTED NOT DETECTED Final   Staphylococcus lugdunensis NOT DETECTED NOT DETECTED Final   Streptococcus species DETECTED (A) NOT DETECTED Final    Comment: CRITICAL RESULT CALLED TO, READ BACK BY AND VERIFIED WITH: PHARMD ELIZABETH M [id number] FCP    Streptococcus agalactiae DETECTED (A) NOT DETECTED Final    Comment: CRITICAL RESULT CALLED TO, READ BACK BY AND VERIFIED WITH: PHARMD ELIZABETH M [id number] FCP    Streptococcus pneumoniae NOT DETECTED NOT DETECTED  Final   Streptococcus pyogenes NOT DETECTED NOT DETECTED Final   A.calcoaceticus-baumannii NOT DETECTED NOT DETECTED Final   Bacteroides fragilis NOT DETECTED NOT DETECTED Final   Enterobacterales NOT DETECTED NOT DETECTED Final   Enterobacter cloacae complex NOT DETECTED NOT DETECTED Final   Escherichia coli NOT DETECTED NOT DETECTED Final   Klebsiella aerogenes NOT DETECTED NOT DETECTED Final   Klebsiella oxytoca NOT DETECTED NOT DETECTED Final   Klebsiella pneumoniae NOT DETECTED NOT DETECTED Final   Proteus species NOT DETECTED NOT DETECTED Final   Salmonella species NOT DETECTED NOT DETECTED Final   Serratia marcescens NOT DETECTED NOT DETECTED Final   Haemophilus influenzae NOT DETECTED NOT DETECTED Final   Neisseria meningitidis NOT DETECTED NOT DETECTED Final   Pseudomonas aeruginosa NOT DETECTED NOT DETECTED Final   Stenotrophomonas maltophilia NOT DETECTED NOT DETECTED Final   Candida albicans NOT DETECTED NOT DETECTED Final   Candida auris NOT DETECTED NOT DETECTED Final   Candida glabrata NOT DETECTED NOT DETECTED Final   Candida krusei NOT DETECTED NOT DETECTED Final   Candida parapsilosis NOT DETECTED NOT DETECTED Final   Candida tropicalis NOT DETECTED NOT DETECTED Final   Cryptococcus neoformans/gattii NOT DETECTED NOT DETECTED Final    Comment: Performed at [facility], [address] [place], [place] [address]  Culture, blood (routine x 2)     Status: Abnormal   Collection Time: 04/09/22  5:37 PM   Specimen: BLOOD LEFT HAND  Result Value Ref Range Status   Specimen Description BLOOD LEFT HAND  Final   Special Requests   Final    BOTTLES DRAWN AEROBIC AND ANAEROBIC Blood Culture adequate volume   Culture  Setup Time   Final    GRAM POSITIVE COCCI IN CHAINS IN BOTH AEROBIC AND ANAEROBIC BOTTLES CRITICAL VALUE NOTED.  VALUE IS CONSISTENT WITH PREVIOUSLY REPORTED AND CALLED VALUE.    Culture (A)  Final    GROUP B  STREP(S.AGALACTIAE)ISOLATED SUSCEPTIBILITIES PERFORMED ON PREVIOUS CULTURE WITHIN THE LAST 5 DAYS. Performed at [facility], [place] [address], [place], [place] [address]    Report Status 04/12/2022 FINAL  Final  MRSA Next Gen by PCR, Nasal     Status: None   Collection Time: 04/10/22  6:25 AM   Specimen: Nasal Mucosa; Nasal Swab  Result Value Ref Range Status   MRSA by PCR Next Gen NOT DETECTED NOT DETECTED Final    Comment: (NOTE) The GeneXpert MRSA Assay (FDA approved for NASAL specimens only), is one component of  a comprehensive MRSA colonization surveillance program. It is not intended to diagnose MRSA infection nor to guide or monitor treatment for MRSA infections. Test performance is not FDA approved in patients less than 35 years old. Performed at [facility], [place] [address] [place], [place] [address]   Culture, blood (Routine X 2) w Reflex to ID Panel     Status: None   Collection Time: 04/10/22  1:33 PM   Specimen: BLOOD RIGHT HAND  Result Value Ref Range Status   Specimen Description BLOOD RIGHT HAND  Final   Special Requests   Final    BOTTLES DRAWN AEROBIC AND ANAEROBIC Blood Culture adequate volume   Culture   Final    NO GROWTH 5 DAYS Performed at [facility], [place] [address], [place], [place] [address]    Report Status 04/16/2022 FINAL  Final      Studies: No results found.  Scheduled Meds:  aspirin EC  81 mg Oral Daily   atorvastatin  40 mg Oral Daily   carvedilol  80 mg Oral Daily   divalproex  1,000 mg Oral QHS   dorzolamide  1 drop Right Eye BID   gabapentin  300 mg Oral BID   latanoprost  1 drop Right Eye BID   melatonin  10 mg Oral QHS   mometasone-formoterol  2 puff Inhalation BID   pantoprazole  40 mg Oral BID   polyethylene glycol  17 g Oral BID   QUEtiapine  300 mg Oral QHS   tamsulosin  0.4 mg Oral QHS   timolol  1 drop Right Eye BID    Continuous Infusions:  penicillin G potassium 12 Million Units in dextrose 5 % 500  mL continuous infusion 41.7 mL/hr at 04/16/22 0600     LOS: 6 days     Phillips Climes, MD [facility]  If 7PM-7AM, please contact night-coverage [email or web address] 04/16/2022, 11:54 AM

## 2022-04-17 ENCOUNTER — Encounter (HOSPITAL_COMMUNITY): Payer: Self-pay | Admitting: Cardiology

## 2022-04-17 ENCOUNTER — Inpatient Hospital Stay (HOSPITAL_COMMUNITY): Payer: Medicare Other

## 2022-04-17 ENCOUNTER — Inpatient Hospital Stay (HOSPITAL_COMMUNITY): Payer: Medicare Other | Admitting: Certified Registered Nurse Anesthetist

## 2022-04-17 ENCOUNTER — Inpatient Hospital Stay (HOSPITAL_COMMUNITY)
Admission: EM | Disposition: A | Discharge status: Skilled Nursing Facility | Payer: Self-pay | Source: Home / Self Care | Attending: Internal Medicine

## 2022-04-17 DIAGNOSIS — R7881 Bacteremia: Secondary | ICD-10-CM

## 2022-04-17 DIAGNOSIS — Z006 Encounter for examination for normal comparison and control in clinical research program: Secondary | ICD-10-CM

## 2022-04-17 DIAGNOSIS — F319 Bipolar disorder, unspecified: Secondary | ICD-10-CM

## 2022-04-17 DIAGNOSIS — Z950 Presence of cardiac pacemaker: Secondary | ICD-10-CM

## 2022-04-17 DIAGNOSIS — I495 Sick sinus syndrome: Secondary | ICD-10-CM

## 2022-04-17 DIAGNOSIS — T827XXS Infection and inflammatory reaction due to other cardiac and vascular devices, implants and grafts, sequela: Secondary | ICD-10-CM | POA: Diagnosis not present

## 2022-04-17 DIAGNOSIS — R8271 Bacteriuria: Secondary | ICD-10-CM | POA: Diagnosis not present

## 2022-04-17 DIAGNOSIS — T827XXA Infection and inflammatory reaction due to other cardiac and vascular devices, implants and grafts, initial encounter: Secondary | ICD-10-CM | POA: Diagnosis not present

## 2022-04-17 DIAGNOSIS — I10 Essential (primary) hypertension: Secondary | ICD-10-CM

## 2022-04-17 HISTORY — PX: LEAD EXTRACTION: EP1211

## 2022-04-17 LAB — CBC WITH DIFFERENTIAL/PLATELET
Abs Immature Granulocytes: 0.64 10*3/uL — ABNORMAL HIGH (ref 0.00–0.07)
Basophils Absolute: 0.1 10*3/uL (ref 0.0–0.1)
Basophils Relative: 0 %
Eosinophils Absolute: 0.3 10*3/uL (ref 0.0–0.5)
Eosinophils Relative: 3 %
HCT: 33.1 % — ABNORMAL LOW (ref 39.0–52.0)
Hemoglobin: 11.7 g/dL — ABNORMAL LOW (ref 13.0–17.0)
Immature Granulocytes: 6 %
Lymphocytes Relative: 15 %
Lymphs Abs: 1.7 10*3/uL (ref 0.7–4.0)
MCH: 31.8 pg (ref 26.0–34.0)
MCHC: 35.3 g/dL (ref 30.0–36.0)
MCV: 89.9 fL (ref 80.0–100.0)
Monocytes Absolute: 1.5 10*3/uL — ABNORMAL HIGH (ref 0.1–1.0)
Monocytes Relative: 13 %
Neutro Abs: 7.1 10*3/uL (ref 1.7–7.7)
Neutrophils Relative %: 63 %
Platelets: 329 10*3/uL (ref 150–400)
RBC: 3.68 MIL/uL — ABNORMAL LOW (ref 4.22–5.81)
RDW: 12.6 % (ref 11.5–15.5)
WBC: 11.2 10*3/uL — ABNORMAL HIGH (ref 4.0–10.5)
nRBC: 0 % (ref 0.0–0.2)

## 2022-04-17 LAB — BASIC METABOLIC PANEL
Anion gap: 7 (ref 5–15)
BUN: 10 mg/dL (ref 8–23)
CO2: 25 mmol/L (ref 22–32)
Calcium: 8.3 mg/dL — ABNORMAL LOW (ref 8.9–10.3)
Chloride: 98 mmol/L (ref 98–111)
Creatinine, Ser: 0.58 mg/dL — ABNORMAL LOW (ref 0.61–1.24)
GFR, Estimated: >60 mL/min (ref >60)
Glucose, Bld: 143 mg/dL — ABNORMAL HIGH (ref 70–99)
Potassium: 3.9 mmol/L (ref 3.5–5.1)
Sodium: 130 mmol/L — ABNORMAL LOW (ref 135–145)

## 2022-04-17 LAB — PREPARE RBC (CROSSMATCH)

## 2022-04-17 SURGERY — LEAD EXTRACTION
Anesthesia: General

## 2022-04-17 MED ORDER — OXYCODONE HCL 5 MG/5ML PO SOLN: 5.0000 mg | Freq: Once | ORAL | Status: DC | PRN

## 2022-04-17 MED ORDER — SUCCINYLCHOLINE CHLORIDE 200 MG/10ML IV SOSY
PREFILLED_SYRINGE | INTRAVENOUS | Status: DC | PRN
  Administered 2022-04-17: 140 mg via INTRAVENOUS

## 2022-04-17 MED ORDER — ROCURONIUM BROMIDE 10 MG/ML (PF) SYRINGE
PREFILLED_SYRINGE | INTRAVENOUS | Status: DC | PRN
  Administered 2022-04-17: 10 mg via INTRAVENOUS
  Administered 2022-04-17: 20 mg via INTRAVENOUS
  Administered 2022-04-17: 80 mg via INTRAVENOUS
  Administered 2022-04-17: 20 mg via INTRAVENOUS
  Administered 2022-04-17: 10 mg via INTRAVENOUS

## 2022-04-17 MED ORDER — CEFAZOLIN IN SODIUM CHLORIDE 3-0.9 GM/100ML-% IV SOLN
3.0000 g | INTRAVENOUS | Status: AC
  Administered 2022-04-17: 3 g via INTRAVENOUS
  Filled 2022-04-17: qty 100

## 2022-04-17 MED ORDER — CHLORHEXIDINE GLUCONATE 0.12 % MT SOLN
OROMUCOSAL | Status: AC
  Filled 2022-04-17: qty 15

## 2022-04-17 MED ORDER — ACETAMINOPHEN 500 MG PO TABS
ORAL_TABLET | ORAL | Status: AC
  Administered 2022-04-17: 1000 mg via ORAL
  Filled 2022-04-17: qty 2

## 2022-04-17 MED ORDER — ONDANSETRON HCL 4 MG/2ML IJ SOLN
INTRAMUSCULAR | Status: DC | PRN
  Administered 2022-04-17: 4 mg via INTRAVENOUS

## 2022-04-17 MED ORDER — PROPOFOL 10 MG/ML IV BOLUS
INTRAVENOUS | Status: DC | PRN
  Administered 2022-04-17: 100 mg via INTRAVENOUS

## 2022-04-17 MED ORDER — CEFAZOLIN (ANCEF) 1 G IV SOLR: 3.0000 g | INTRAVENOUS | Status: DC

## 2022-04-17 MED ORDER — SODIUM CHLORIDE 0.9 % IV SOLN: 80.0000 mg | Freq: Once | INTRAVENOUS | Status: DC

## 2022-04-17 MED ORDER — CEFAZOLIN SODIUM-DEXTROSE 2-4 GM/100ML-% IV SOLN
INTRAVENOUS | Status: AC
  Filled 2022-04-17: qty 100

## 2022-04-17 MED ORDER — BUPIVACAINE HCL (PF) 0.25 % IJ SOLN
INTRAMUSCULAR | Status: AC
  Filled 2022-04-17: qty 30

## 2022-04-17 MED ORDER — FENTANYL CITRATE (PF) 250 MCG/5ML IJ SOLN
INTRAMUSCULAR | Status: AC
  Filled 2022-04-17: qty 5

## 2022-04-17 MED ORDER — PHENYLEPHRINE 80 MCG/ML (10ML) SYRINGE FOR IV PUSH (FOR BLOOD PRESSURE SUPPORT)
PREFILLED_SYRINGE | INTRAVENOUS | Status: DC | PRN
  Administered 2022-04-17: 80 ug via INTRAVENOUS
  Administered 2022-04-17 (×2): 160 ug via INTRAVENOUS
  Administered 2022-04-17: 80 ug via INTRAVENOUS
  Administered 2022-04-17: 160 ug via INTRAVENOUS
  Administered 2022-04-17: 80 ug via INTRAVENOUS
  Administered 2022-04-17: 160 ug via INTRAVENOUS
  Administered 2022-04-17: 80 ug via INTRAVENOUS

## 2022-04-17 MED ORDER — SODIUM CHLORIDE 0.9% IV SOLUTION: Freq: Once | INTRAVENOUS | Status: DC

## 2022-04-17 MED ORDER — SODIUM CHLORIDE 0.9 % IV SOLN: INTRAVENOUS | Status: DC | PRN

## 2022-04-17 MED ORDER — ACETAMINOPHEN 500 MG PO TABS: 1000.0000 mg | ORAL_TABLET | Freq: Once | ORAL | Status: AC

## 2022-04-17 MED ORDER — DEXAMETHASONE SODIUM PHOSPHATE 10 MG/ML IJ SOLN
INTRAMUSCULAR | Status: DC | PRN
  Administered 2022-04-17: 4 mg via INTRAVENOUS

## 2022-04-17 MED ORDER — FENTANYL CITRATE (PF) 100 MCG/2ML IJ SOLN: 25.0000 ug | INTRAMUSCULAR | Status: DC | PRN

## 2022-04-17 MED ORDER — SUGAMMADEX SODIUM 200 MG/2ML IV SOLN
INTRAVENOUS | Status: DC | PRN
  Administered 2022-04-17: 400 mg via INTRAVENOUS

## 2022-04-17 MED ORDER — OXYCODONE HCL 5 MG PO TABS: 5.0000 mg | ORAL_TABLET | Freq: Once | ORAL | Status: DC | PRN

## 2022-04-17 MED ORDER — ACETAMINOPHEN 325 MG PO TABS: 650.0000 mg | ORAL_TABLET | Freq: Four times a day (QID) | ORAL | Status: DC | PRN

## 2022-04-17 MED ORDER — FENTANYL CITRATE (PF) 250 MCG/5ML IJ SOLN
INTRAMUSCULAR | Status: DC | PRN
  Administered 2022-04-17: 50 ug via INTRAVENOUS
  Administered 2022-04-17: 25 ug via INTRAVENOUS
  Administered 2022-04-17: 50 ug via INTRAVENOUS
  Administered 2022-04-17: 25 ug via INTRAVENOUS

## 2022-04-17 MED ORDER — SODIUM CHLORIDE 0.9 % IV SOLN
INTRAVENOUS | Status: AC
  Filled 2022-04-17: qty 2

## 2022-04-17 MED ORDER — LACTATED RINGERS IV SOLN: INTRAVENOUS | Status: DC

## 2022-04-17 MED ORDER — NITROGLYCERIN 1 MG/10 ML FOR IR/CATH LAB
INTRA_ARTERIAL | Status: AC
  Filled 2022-04-17: qty 10

## 2022-04-17 MED ORDER — PHENYLEPHRINE HCL-NACL 20-0.9 MG/250ML-% IV SOLN
INTRAVENOUS | Status: DC | PRN
  Administered 2022-04-17: 40 ug/min via INTRAVENOUS

## 2022-04-17 MED ORDER — LIDOCAINE 2% (20 MG/ML) 5 ML SYRINGE
INTRAMUSCULAR | Status: DC | PRN
  Administered 2022-04-17: 100 mg via INTRAVENOUS

## 2022-04-17 MED ORDER — ONDANSETRON HCL 4 MG/2ML IJ SOLN: 4.0000 mg | Freq: Once | INTRAMUSCULAR | Status: DC | PRN

## 2022-04-17 SURGICAL SUPPLY — 26 items
BAG SNAP BAND KOVER 36X36 (MISCELLANEOUS) IMPLANT
BALLN OCL BRIDGE 80X90X.9 60 (BALLOONS) ×1
BALLOON OCL BRIDGE 80X90X.9 60 (BALLOONS) IMPLANT
BRIDGE PREP KIT (KITS) ×1
CLOSURE PERCLOSE PROSTYLE (VASCULAR PRODUCTS) IMPLANT
DEVICE LCKNG LEAD CARDIAC (CATHETERS) IMPLANT
KIT BRIDGE PREP (KITS) IMPLANT
KIT LEAD ACCESSORY 6056 PINCH (KITS) IMPLANT
MAT PREVALON FULL STRYKER (MISCELLANEOUS) IMPLANT
MICRA INTRODUCER SHEATH (SHEATH) ×1
PACEMAKER LEADLESS AV2 MICRA (Pacemaker) IMPLANT
PACK EP LATEX FREE (CUSTOM PROCEDURE TRAY) ×1
PACK EP LF (CUSTOM PROCEDURE TRAY) IMPLANT
PAD DEFIB RADIO PHYSIO CONN (PAD) IMPLANT
REMOVAL LLD CARDIAC LEAD EZ (CATHETERS) ×2
SHEATH DILAT COONS TAPER 22F (SHEATH) IMPLANT
SHEATH FAST CATH 12F 12CM (SHEATH) IMPLANT
SHEATH INTRODUCER MICRA (SHEATH) IMPLANT
SHEATH LASER 14 FR GLIDELIGHT (SHEATH) IMPLANT
SHEATH PINNACLE 7F 10CM (SHEATH) IMPLANT
SHEATH PINNACLE 8F 10CM (SHEATH) IMPLANT
SHEATH TIGHTRAIL MECH 13F (SHEATH) IMPLANT
SHEATH TIGHTRAIL MINI 11F (SHEATH) IMPLANT
TRAY PACEMAKER INSERTION (PACKS) IMPLANT
WIRE AMPLATZ SS-J .035X180CM (WIRE) IMPLANT
WIRE HI TORQ VERSACORE-J 145CM (WIRE) IMPLANT

## 2022-04-17 NOTE — Transfer of Care (Signed)
Immediate Anesthesia Transfer of Care Note  Patient: Douglas Perkins.  Procedure(s) Performed: LEAD EXTRACTION  Patient Location: PACU  Anesthesia Type:General  Level of Consciousness: awake, alert , and oriented  Airway & Oxygen Therapy: Patient Spontanous Breathing and Patient connected to face mask oxygen  Post-op Assessment: Report given to RN, Post -op Vital signs reviewed and stable, and Patient moving all extremities  Post vital signs: Reviewed and stable  Last Vitals:  Vitals Value Taken Time  BP 158/70 04/17/22 1945  Temp 36.6 C 04/17/22 1930  Pulse 81 04/17/22 1947  Resp 15 04/17/22 1947  SpO2 94 % 04/17/22 1947  Vitals shown include unvalidated device data.  Last Pain:  Vitals:   04/17/22 1930  TempSrc:   PainSc: 0-No pain      Patients Stated Pain Goal: 0 (82/95/62 1308)  Complications: There were no known notable events for this encounter.

## 2022-04-17 NOTE — Plan of Care (Signed)

## 2022-04-17 NOTE — Anesthesia Preprocedure Evaluation (Addendum)
Anesthesia Evaluation  Patient identified by MRN, date of birth, ID band Patient awake    Reviewed: Allergy & Precautions, H&P , NPO status , Patient's Chart, lab work & pertinent test results  Airway Mallampati: II  TM Distance: >3 FB Neck ROM: Full    Dental no notable dental hx.    Pulmonary neg pulmonary ROS, sleep apnea    Pulmonary exam normal breath sounds clear to auscultation       Cardiovascular hypertension, Pt. 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   Neuro/Psych     Bipolar Disorder   TIA negative psych ROS   GI/Hepatic negative GI ROS, Neg liver ROS,,,  Endo/Other  negative endocrine ROS    Renal/GU negative Renal ROS  negative genitourinary   Musculoskeletal negative musculoskeletal ROS (+)    Abdominal   Peds negative pediatric ROS (+)  Hematology negative hematology ROS (+)   Anesthesia Other Findings   Reproductive/Obstetrics negative OB ROS                             Anesthesia Physical Anesthesia Plan  ASA: 3  Anesthesia Plan: General   Post-op Pain Management:    Induction: Intravenous  PONV Risk Score and Plan: 3 and Ondansetron, Dexamethasone and Treatment may vary due to age or medical condition  Airway Management Planned: Oral ETT  Additional Equipment: Arterial line and TEE  Intra-op Plan:   Post-operative Plan: Extubation in OR  Informed Consent: I have reviewed the patients History and Physical, chart, labs and discussed the procedure including the risks, benefits and alternatives for the proposed anesthesia with the patient or authorized representative who has indicated his/her understanding and acceptance.     Dental advisory given  Plan Discussed with: CRNA and Surgeon  Anesthesia Plan Comments:        Anesthesia Quick Evaluation

## 2022-04-17 NOTE — Anesthesia Procedure Notes (Signed)
Procedure Name: Intubation Date/Time: 04/17/2022 4:44 PM  Performed by: Janene Harvey, CRNAPre-anesthesia Checklist: Patient identified, Emergency Drugs available, Suction available and Patient being monitored Patient Re-evaluated:Patient Re-evaluated prior to induction Oxygen Delivery Method: Circle system utilized Preoxygenation: Pre-oxygenation with 100% oxygen Induction Type: IV induction and Rapid sequence Laryngoscope Size: Glidescope and 4 Grade View: Grade I Tube type: Oral Tube size: 7.5 mm Number of attempts: 1 Airway Equipment and Method: Stylet and Oral airway Placement Confirmation: ETT inserted through vocal cords under direct vision, positive ETCO2 and breath sounds checked- equal and bilateral Secured at: 23 cm Tube secured with: Tape Dental Injury: Teeth and Oropharynx as per pre-operative assessment  Comments: Elective glidescope - hx glidescope intubation

## 2022-04-17 NOTE — Progress Notes (Signed)
PROGRESS NOTE  Douglas Perkins. PPI:[id number] DOB: [birth year] DOA: 04/09/2022 PCP: [name], MD  HPI/Recap of past 24 hours:  Douglas Perkins. is a 76 y.o. 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 Pt presents to ED from [place].  Was found on the ground after an unwitnessed fall. Was nauseous and diaphoretic and vomited once. Found to be altered initially and sats were around 90% so placed on nonrebreather. In the ED, noted to be septic, started on broad-spectrum antibiotics. Patient admitted for further management.    Subjective:  No significant events overnight, he denies any complaints today.   Assessment/Plan: Principal Problem:   Pacemaker infection ([place]) Active Problems:   SIRS (systemic inflammatory response syndrome) (HCC)   Benign essential hypertension   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HCC)   Sepsis (HCC)   Streptococcal bacteremia   Severe sepsis likely 2/2 Group B streptococcal bacteremia Pacemaker lead infection On admission leukocytosis, febrile, tachycardic, tachypneic, lactic acid of 3.5 Currently afebrile, with resolving leukocytosis, lactic acid resolved Procalcitonin elevated, trending down BC X 2 growing group B streptococcus, repeat pending UA negative CTA chest neg for PNA or PE CT A/p: Noted area of inflammation in right groin secondary to prior radiation therapy ID on board, appreciate recs TTE showed EF of 60 to 65%, no regional wall motion abnormality, recommend TEE scheduled for 12/15 (pacemaker) Cardiology and EP consulted, rec lead extraction,  ID narrowed broad-spectrum antibiotics to penicillin S/P IV fluids, BP stable -TEE 12/15  significant for mobile mass on atrial lead, indicating pacemaker lead infection, I have discussed with the patient, he is agreeable to extraction, management per EP.  For extraction today per EP.  And Micra leadless pacemaker  implant.   Hypertension -Coreg CAD initially on hold given soft blood pressure, resumed currently.   CAD/HLD Mildly elevated troponin-likely demand ischemia Echo noted as above Denies any chest pain Continue aspirin, Lipitor  Merkel cell carcinoma S/p radiation therapy CT showing area of inflammation in right groin RLE swollen with erythema, will order doppler  Obstructive sleep apnea Does have implantable inspire device  Obesity Lifestyle modification advised    Estimated body mass index is 38.09 kg/m as calculated from the following:   Height as of this encounter: 6' (1.829 m).   Weight as of this encounter: 127.4 kg.     Code Status: Full  Family Communication: None at bedside  Disposition Plan: Status is: Inpatient The patient will require care spanning > 2 midnights and should be moved to inpatient because: Level of care      Consultants: ID EP  Procedures: TEE 12/15  Antimicrobials: Penicillin G  DVT prophylaxis: Lovenox   Objective: Vitals:   04/17/22 0900 04/17/22 1000 04/17/22 1100 04/17/22 1200  BP:    132/60  Pulse: 73 86 81 81  Resp: 20 19 (!) 23 (!) 21  Temp:    (!) 97.5 F (36.4 C)  TempSrc:    Oral  SpO2: 96% 98% 96% 93%  Weight:      Height:        Intake/Output Summary (Last 24 hours) at 04/17/2022 1503 Last data filed at 04/17/2022 0500 Gross per 24 hour  Intake 959.1 ml  Output 1000 ml  Net -40.9 ml   Filed Weights   04/09/22 1727 04/11/22 1515  Weight: 97.5 kg 127.4 kg    Exam:  Awake Alert, Oriented X 3, No new F.N deficits,  Normal affect Symmetrical Chest wall movement, Good air movement bilaterally, CTAB RRR,No Gallops,Rubs or new Murmurs, No Parasternal Heave +ve B.Sounds, Abd Soft, No tenderness, No rebound - guarding or rigidity. No Cyanosis, Clubbing or edema, chronic right lower extremity skin changes from previous radiation.    Data Reviewed: CBC: Recent Labs  Lab 04/13/22 0430 04/14/22 0307  04/15/22 0217 04/16/22 0204 04/16/22 2337  WBC 10.0 9.5 11.7* 11.6* 11.2*  NEUTROABS 6.2 5.6 7.7 7.2 7.1  HGB 12.5* 12.2* 12.6* 11.6* 11.7*  HCT 35.8* 36.5* 37.3* 33.7* 33.1*  MCV 90.9 92.6 91.6 91.3 89.9  PLT 172 259 302 295 144   Basic Metabolic Panel: Recent Labs  Lab 04/12/22 0452 04/13/22 0430 04/14/22 0307 04/15/22 0217 04/16/22 2337  NA 134* 134* 131* 132* 130*  K 4.0 4.5 4.0 4.1 3.9  CL 99 100 96* 97* 98  CO2 26 21* '25 26 25  '$ GLUCOSE 128* 133* 130* 140* 143*  BUN '9 10 11 9 10  '$ CREATININE 0.49* 0.62 0.66 0.60* 0.58*  CALCIUM 8.4* 8.6* 8.5* 8.7* 8.3*   GFR: Estimated Creatinine Clearance: 104.9 mL/min (A) (by C-G formula based on SCr of 0.58 mg/dL (L)). Liver Function Tests: No results for input(s): "AST", "ALT", "ALKPHOS", "BILITOT", "PROT", "ALBUMIN" in the last 168 hours.  No results for input(s): "LIPASE", "AMYLASE" in the last 168 hours.  No results for input(s): "AMMONIA" in the last 168 hours. Coagulation Profile: No results for input(s): "INR", "PROTIME" in the last 168 hours.  Cardiac Enzymes: No results for input(s): "CKTOTAL", "CKMB", "CKMBINDEX", "TROPONINI" in the last 168 hours.  BNP (last 3 results) No results for input(s): "PROBNP" in the last 8760 hours. HbA1C: No results for input(s): "HGBA1C" in the last 72 hours. CBG: No results for input(s): "GLUCAP" in the last 168 hours. Lipid Profile: No results for input(s): "CHOL", "HDL", "LDLCALC", "TRIG", "CHOLHDL", "LDLDIRECT" in the last 72 hours. Thyroid Function Tests: No results for input(s): "TSH", "T4TOTAL", "FREET4", "T3FREE", "THYROIDAB" in the last 72 hours. Anemia Panel: No results for input(s): "VITAMINB12", "FOLATE", "FERRITIN", "TIBC", "IRON", "RETICCTPCT" in the last 72 hours. Urine analysis:    Component Value Date/Time   COLORURINE YELLOW 04/09/2022 2135   APPEARANCEUR HAZY (A) 04/09/2022 2135   APPEARANCEUR Clear 05/24/2015 1328   LABSPEC 1.017 04/09/2022 2135   PHURINE  6.0 04/09/2022 2135   GLUCOSEU NEGATIVE 04/09/2022 2135   HGBUR SMALL (A) 04/09/2022 2135   BILIRUBINUR NEGATIVE 04/09/2022 2135   BILIRUBINUR Negative 05/24/2015 [place] 04/09/2022 2135   PROTEINUR 30 (A) 04/09/2022 2135   NITRITE NEGATIVE 04/09/2022 2135   LEUKOCYTESUR NEGATIVE 04/09/2022 2135   Sepsis Labs: '@LABRCNTIP'$ (procalcitonin:4,lacticidven:4)  ) Recent Results (from the past 240 hour(s))  Resp panel by RT-PCR (RSV, Flu A&B, Covid) Anterior Nasal Swab     Status: None   Collection Time: 04/09/22  5:18 PM   Specimen: Anterior Nasal Swab  Result Value Ref Range Status   SARS Coronavirus 2 by RT PCR NEGATIVE NEGATIVE Final    Comment: (NOTE) SARS-CoV-2 target nucleic acids are NOT DETECTED.  The SARS-CoV-2 RNA is generally detectable in upper respiratory specimens during the acute phase of infection. The lowest concentration of SARS-CoV-2 viral copies this assay can detect is 138 copies/mL. A negative result does not preclude SARS-Cov-2 infection and should not be used as the sole basis for treatment or other patient management decisions. A negative result may occur with  improper specimen collection/handling, submission of specimen other than nasopharyngeal swab, presence of viral  mutation(s) within the areas targeted by this assay, and inadequate number of viral copies(<138 copies/mL). A negative result must be combined with clinical observations, patient history, and epidemiological information. The expected result is Negative.  Fact Sheet for Patients:  EntrepreneurPulse.com.au  Fact Sheet for Healthcare Providers:  IncredibleEmployment.be  This test is no t yet approved or cleared by the [place] FDA and  has been authorized for detection and/or diagnosis of SARS-CoV-2 by FDA under an Emergency Use Authorization (EUA). This EUA will remain  in effect (meaning this test can be used) for the duration of  the COVID-19 declaration under Section 564(b)(1) of the Act, 21 U.S.C.section 360bbb-3(b)(1), unless the authorization is terminated  or revoked sooner.       Influenza A by PCR NEGATIVE NEGATIVE Final   Influenza B by PCR NEGATIVE NEGATIVE Final    Comment: (NOTE) The Xpert Xpress SARS-CoV-2/FLU/RSV plus assay is intended as an aid in the diagnosis of influenza from Nasopharyngeal swab specimens and should not be used as a sole basis for treatment. Nasal washings and aspirates are unacceptable for Xpert Xpress SARS-CoV-2/FLU/RSV testing.  Fact Sheet for Patients: EntrepreneurPulse.com.au  Fact Sheet for Healthcare Providers: IncredibleEmployment.be  This test is not yet approved or cleared by the [place] FDA and has been authorized for detection and/or diagnosis of SARS-CoV-2 by FDA under an Emergency Use Authorization (EUA). This EUA will remain in effect (meaning this test can be used) for the duration of the COVID-19 declaration under Section 564(b)(1) of the Act, 21 U.S.C. section 360bbb-3(b)(1), unless the authorization is terminated or revoked.     Resp Syncytial Virus by PCR NEGATIVE NEGATIVE Final    Comment: (NOTE) Fact Sheet for Patients: EntrepreneurPulse.com.au  Fact Sheet for Healthcare Providers: IncredibleEmployment.be  This test is not yet approved or cleared by the [place] FDA and has been authorized for detection and/or diagnosis of SARS-CoV-2 by FDA under an Emergency Use Authorization (EUA). This EUA will remain in effect (meaning this test can be used) for the duration of the COVID-19 declaration under Section 564(b)(1) of the Act, 21 U.S.C. section 360bbb-3(b)(1), unless the authorization is terminated or revoked.  Performed at [facility], [place] [address] [place], [place] [address]   Culture, blood (routine x 2)     Status: Abnormal   Collection Time:  04/09/22  5:24 PM   Specimen: BLOOD RIGHT HAND  Result Value Ref Range Status   Specimen Description BLOOD RIGHT HAND  Final   Special Requests   Final    BOTTLES DRAWN AEROBIC AND ANAEROBIC Blood Culture adequate volume   Culture  Setup Time   Final    GRAM POSITIVE COCCI IN CHAINS IN BOTH AEROBIC AND ANAEROBIC BOTTLES CRITICAL RESULT CALLED TO, READ BACK BY AND VERIFIED WITH: Cleone E [id number] FCP Performed at [facility], [place] [address] [place], [place] [address]    Culture GROUP B STREP(S.AGALACTIAE)ISOLATED (A)  Final   Report Status 04/12/2022 FINAL  Final   Organism ID, Bacteria GROUP B STREP(S.AGALACTIAE)ISOLATED  Final      Susceptibility   Group b strep(s.agalactiae)isolated - MIC*    CLINDAMYCIN >=1 RESISTANT Resistant     AMPICILLIN <=0.25 SENSITIVE Sensitive     ERYTHROMYCIN >=8 RESISTANT Resistant     VANCOMYCIN 0.5 SENSITIVE Sensitive     CEFTRIAXONE <=0.12 SENSITIVE Sensitive     LEVOFLOXACIN 1 SENSITIVE Sensitive     PENICILLIN <=0.06 SENSITIVE Sensitive     * GROUP B  STREP(S.AGALACTIAE)ISOLATED  Blood Culture ID Panel (Reflexed)     Status: Abnormal   Collection Time: 04/09/22  5:24 PM  Result Value Ref Range Status   Enterococcus faecalis NOT DETECTED NOT DETECTED Final   Enterococcus Faecium NOT DETECTED NOT DETECTED Final   Listeria monocytogenes NOT DETECTED NOT DETECTED Final   Staphylococcus species NOT DETECTED NOT DETECTED Final   Staphylococcus aureus (BCID) NOT DETECTED NOT DETECTED Final   Staphylococcus epidermidis NOT DETECTED NOT DETECTED Final   Staphylococcus lugdunensis NOT DETECTED NOT DETECTED Final   Streptococcus species DETECTED (A) NOT DETECTED Final    Comment: CRITICAL RESULT CALLED TO, READ BACK BY AND VERIFIED WITH: PHARMD ELIZABETH M [id number] FCP    Streptococcus agalactiae DETECTED (A) NOT DETECTED Final    Comment: CRITICAL RESULT CALLED TO, READ BACK BY AND VERIFIED WITH: PHARMD ELIZABETH M 1011 [id number]  FCP    Streptococcus pneumoniae NOT DETECTED NOT DETECTED Final   Streptococcus pyogenes NOT DETECTED NOT DETECTED Final   A.calcoaceticus-baumannii NOT DETECTED NOT DETECTED Final   Bacteroides fragilis NOT DETECTED NOT DETECTED Final   Enterobacterales NOT DETECTED NOT DETECTED Final   Enterobacter cloacae complex NOT DETECTED NOT DETECTED Final   Escherichia coli NOT DETECTED NOT DETECTED Final   Klebsiella aerogenes NOT DETECTED NOT DETECTED Final   Klebsiella oxytoca NOT DETECTED NOT DETECTED Final   Klebsiella pneumoniae NOT DETECTED NOT DETECTED Final   Proteus species NOT DETECTED NOT DETECTED Final   Salmonella species NOT DETECTED NOT DETECTED Final   Serratia marcescens NOT DETECTED NOT DETECTED Final   Haemophilus influenzae NOT DETECTED NOT DETECTED Final   Neisseria meningitidis NOT DETECTED NOT DETECTED Final   Pseudomonas aeruginosa NOT DETECTED NOT DETECTED Final   Stenotrophomonas maltophilia NOT DETECTED NOT DETECTED Final   Candida albicans NOT DETECTED NOT DETECTED Final   Candida auris NOT DETECTED NOT DETECTED Final   Candida glabrata NOT DETECTED NOT DETECTED Final   Candida krusei NOT DETECTED NOT DETECTED Final   Candida parapsilosis NOT DETECTED NOT DETECTED Final   Candida tropicalis NOT DETECTED NOT DETECTED Final   Cryptococcus neoformans/gattii NOT DETECTED NOT DETECTED Final    Comment: Performed at [facility], [address], [place], [place] [address]  Culture, blood (routine x 2)     Status: Abnormal   Collection Time: 04/09/22  5:37 PM   Specimen: BLOOD LEFT HAND  Result Value Ref Range Status   Specimen Description BLOOD LEFT HAND  Final   Special Requests   Final    BOTTLES DRAWN AEROBIC AND ANAEROBIC Blood Culture adequate volume   Culture  Setup Time   Final    GRAM POSITIVE COCCI IN CHAINS IN BOTH AEROBIC AND ANAEROBIC BOTTLES CRITICAL VALUE NOTED.  VALUE IS CONSISTENT WITH PREVIOUSLY REPORTED AND CALLED VALUE.    Culture (A)   Final    GROUP B STREP(S.AGALACTIAE)ISOLATED SUSCEPTIBILITIES PERFORMED ON PREVIOUS CULTURE WITHIN THE LAST 5 DAYS. Performed at [facility], [place] [address], [place],  [address]    Report Status 04/12/2022 FINAL  Final  MRSA Next Gen by PCR, Nasal     Status: None   Collection Time: 04/10/22  6:25 AM   Specimen: Nasal Mucosa; Nasal Swab  Result Value Ref Range Status   MRSA by PCR Next Gen NOT DETECTED NOT DETECTED Final    Comment: (NOTE) The GeneXpert MRSA Assay (FDA approved for NASAL specimens only), is one component of a comprehensive MRSA colonization surveillance program. It is not  intended to diagnose MRSA infection nor to guide or monitor treatment for MRSA infections. Test performance is not FDA approved in patients less than 26 years old. Performed at [facility], [place] [address], [place], [place] [address]   Culture, blood (Routine X 2) w Reflex to ID Panel     Status: None   Collection Time: 04/10/22  1:33 PM   Specimen: BLOOD RIGHT HAND  Result Value Ref Range Status   Specimen Description BLOOD RIGHT HAND  Final   Special Requests   Final    BOTTLES DRAWN AEROBIC AND ANAEROBIC Blood Culture adequate volume   Culture   Final    NO GROWTH 5 DAYS Performed at [facility], [place] [address] [place], [place] [address]    Report Status 04/16/2022 FINAL  Final      Studies: No results found.  Scheduled Meds:  [MAR Hold] sodium chloride   Intravenous Once   sodium chloride   Intravenous Once   [MAR Hold] atorvastatin  40 mg Oral Daily   chlorhexidine       [MAR Hold] divalproex  1,000 mg Oral QHS   [MAR Hold] dorzolamide  1 drop Right Eye BID   [MAR Hold] gabapentin  300 mg Oral BID   [MAR Hold] latanoprost  1 drop Right Eye BID   [MAR Hold] melatonin  10 mg Oral QHS   [MAR Hold] mometasone-formoterol  2 puff Inhalation BID   [MAR Hold] pantoprazole  40 mg Oral BID   [MAR Hold] QUEtiapine  300 mg Oral QHS   [MAR Hold] tamsulosin  0.4  mg Oral QHS   [MAR Hold] timolol  1 drop Right Eye BID    Continuous Infusions:  [MAR Hold] penicillin G potassium 12 Million Units in dextrose 5 % 500 mL continuous infusion 12 Million Units (04/17/22 0312)     LOS: 7 days     Phillips Climes, MD [facility]  If 7PM-7AM, please contact night-coverage [email or web address] 04/17/2022, 3:03 PM

## 2022-04-17 NOTE — Anesthesia Postprocedure Evaluation (Signed)
Anesthesia Post Note  Patient: Douglas Perkins.  Procedure(s) Performed: LEAD EXTRACTION     Patient location during evaluation: PACU Anesthesia Type: General Level of consciousness: awake and alert Pain management: pain level controlled Vital Signs Assessment: post-procedure vital signs reviewed and stable Respiratory status: spontaneous breathing, nonlabored ventilation, respiratory function stable and patient connected to nasal cannula oxygen Cardiovascular status: blood pressure returned to baseline and stable Postop Assessment: no apparent nausea or vomiting Anesthetic complications: no   There were no known notable events for this encounter.  Last Vitals:  Vitals:   04/17/22 2015 04/17/22 2100  BP: (!) 150/67 (!) 169/71  Pulse: 84 83  Resp:  20  Temp: 36.4 C 36.5 C  SpO2: 93% 92%    Last Pain:  Vitals:   04/17/22 2100  TempSrc: Oral  PainSc:                  Effie Berkshire

## 2022-04-17 NOTE — Anesthesia Procedure Notes (Signed)
Arterial Line Insertion Start/End12/18/2023 3:30 PM Performed by: Janene Harvey, CRNA, CRNA  Preanesthetic checklist: patient identified, risks and benefits discussed and monitors and equipment checked Lidocaine 1% used for infiltration Right, radial was placed Catheter size: 20 G Hand hygiene performed   Attempts: 1 Procedure performed without using ultrasound guided technique. Following insertion, dressing applied and Biopatch. Post procedure assessment: unchanged  Patient tolerated the procedure well with no immediate complications.

## 2022-04-17 NOTE — Progress Notes (Signed)
Rounding Note    Patient Name: Douglas Perkins. Date of Encounter: 04/17/2022  [facility] Cardiologist: Dr. Nehemiah Massed  Subjective   No complaints, no CP, no SOB  Inpatient Medications    Scheduled Meds:  sodium chloride   Intravenous Once   atorvastatin  40 mg Oral Daily   carvedilol  80 mg Oral Daily   divalproex  1,000 mg Oral QHS   dorzolamide  1 drop Right Eye BID   gabapentin  300 mg Oral BID   latanoprost  1 drop Right Eye BID   melatonin  10 mg Oral QHS   mometasone-formoterol  2 puff Inhalation BID   pantoprazole  40 mg Oral BID   polyethylene glycol  17 g Oral BID   QUEtiapine  300 mg Oral QHS   tamsulosin  0.4 mg Oral QHS   timolol  1 drop Right Eye BID   Continuous Infusions:  penicillin G potassium 12 Million Units in dextrose 5 % 500 mL continuous infusion 12 Million Units (04/17/22 0312)   PRN Meds: acetaminophen **OR** acetaminophen, albuterol, ondansetron **OR** ondansetron (ZOFRAN) IV   Vital Signs    Vitals:   04/16/22 0800 04/16/22 2000 04/17/22 0038 04/17/22 0400  BP: (!) 141/80 (!) 157/61 (!) 134/53 (!) 140/63  Pulse: 86 82 78 86  Resp: (!) 24 (!) '22 20 14  '$ Temp: 97.9 F (36.6 C) 97.8 F (36.6 C) 97.6 F (36.4 C) 97.6 F (36.4 C)  TempSrc: Oral Oral Oral Oral  SpO2: 96% 95% 96% 95%  Weight:      Height:        Intake/Output Summary (Last 24 hours) at 04/17/2022 0718 Last data filed at 04/17/2022 0500 Gross per 24 hour  Intake 959.1 ml  Output 1000 ml  Net -40.9 ml      04/11/2022    3:15 PM 04/09/2022    5:27 PM 03/03/2021    3:00 PM  Last 3 Weights  Weight (lbs) 280 lb 13.9 oz 214 lb 15.2 oz   Weight (kg) 127.4 kg 97.5 kg      Information is confidential and restricted. Go to Review Flowsheets to unlock data.      Telemetry    SR 70's - Personally Reviewed  ECG    No new EKGs - Personally Reviewed  Physical Exam   GEN: No acute distress.   Neck: No JVD Cardiac: RRR, no murmurs, rubs, or  gallops.  Respiratory: CTA b/l. GI: Soft, nontender, non-distended  MS: No edema; No deformity. Neuro:  Nonfocal  Psych: Normal affect   Labs    High Sensitivity Troponin:   Recent Labs  Lab 04/09/22 1737 04/09/22 2010  TROPONINIHS 33* 54*     Chemistry Recent Labs  Lab 04/14/22 0307 04/15/22 0217 04/16/22 2337  NA 131* 132* 130*  K 4.0 4.1 3.9  CL 96* 97* 98  CO2 '25 26 25  '$ GLUCOSE 130* 140* 143*  BUN '11 9 10  '$ CREATININE 0.66 0.60* 0.58*  CALCIUM 8.5* 8.7* 8.3*  GFRNONAA >60 >60 >60  ANIONGAP '10 9 7    '$ Lipids No results for input(s): "CHOL", "TRIG", "HDL", "LABVLDL", "LDLCALC", "CHOLHDL" in the last 168 hours.  Hematology Recent Labs  Lab 04/15/22 0217 04/16/22 0204 04/16/22 2337  WBC 11.7* 11.6* 11.2*  RBC 4.07* 3.69* 3.68*  HGB 12.6* 11.6* 11.7*  HCT 37.3* 33.7* 33.1*  MCV 91.6 91.3 89.9  MCH 31.0 31.4 31.8  MCHC 33.8 34.4 35.3  RDW 12.8 12.8 12.6  PLT 302 295 329   Thyroid No results for input(s): "TSH", "FREET4" in the last 168 hours.  BNPNo results for input(s): "BNP", "PROBNP" in the last 168 hours.  DDimer No results for input(s): "DDIMER" in the last 168 hours.   Radiology    No results found.  Cardiac Studies    04/14/22: TEE  1. No valvular vegetations noted; oscillating density right atrial lead  consistent with vegetation.   2. Left ventricular ejection fraction, by estimation, is 55 to 60%. The  left ventricle has normal function. The left ventricle has no regional  wall motion abnormalities.   3. Right ventricular systolic function is normal. The right ventricular  size is normal.   4. No left atrial/left atrial appendage thrombus was detected.   5. The mitral valve is normal in structure. Trivial mitral valve  regurgitation.   6. The aortic valve is tricuspid. Aortic valve regurgitation is not  visualized. Aortic valve sclerosis is present, with no evidence of aortic  valve stenosis.   7. There is Moderate (Grade III) plaque  involving the descending aorta.   8. Agitated saline contrast bubble study was negative, with no evidence  of any interatrial shunt.   Patient Profile     76 y.o. 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  Sinus pause w/PPM Endocarditis Planned for PPM system extraction Likely temp-perm placement until micra can be placed Orders are in Hold ASA today/for now until post implant site stability assessed Hold coreg this AM Labs look ok  Pt remains agreeable No additional questions this morning  Minimal pacing requirements R chest is unavailable with the Inspire device R groin unavailable with prior radiation   Leaves the Left groin only for micra Likely plan for temp-perm via left IJ   2. Severe sepsis 3. Group B streptococcal bacteremia ID on board   4. Markel cell carcinoma S/p radiation therapy CT showing area of inflammation in R groin   5. OSA Has R sided implantable inspire device. As is extravascular, should not need extraction unless pocket/site is actively infected   6. Acute on chronic diastolic CHF On RA Cumulatively neg now 1821 Denies SOB Laying comfortably in his back this AM about 20degrees      Signed, Baldwin Jamaica, PA-C  04/17/2022, 7:18 AM

## 2022-04-17 NOTE — Progress Notes (Signed)
Spoke with Dr. Gasper Sells and he advised ok to give tylenol, atorvastatin, gabapentin, and pantoprazole.

## 2022-04-18 ENCOUNTER — Inpatient Hospital Stay (HOSPITAL_COMMUNITY): Payer: Medicare Other

## 2022-04-18 ENCOUNTER — Inpatient Hospital Stay: Payer: Self-pay

## 2022-04-18 ENCOUNTER — Encounter (HOSPITAL_COMMUNITY): Payer: Self-pay | Admitting: Cardiology

## 2022-04-18 ENCOUNTER — Other Ambulatory Visit: Payer: Self-pay

## 2022-04-18 DIAGNOSIS — B955 Unspecified streptococcus as the cause of diseases classified elsewhere: Secondary | ICD-10-CM | POA: Diagnosis not present

## 2022-04-18 DIAGNOSIS — T827XXD Infection and inflammatory reaction due to other cardiac and vascular devices, implants and grafts, subsequent encounter: Secondary | ICD-10-CM | POA: Diagnosis not present

## 2022-04-18 DIAGNOSIS — R7881 Bacteremia: Secondary | ICD-10-CM | POA: Diagnosis not present

## 2022-04-18 LAB — CBC WITH DIFFERENTIAL/PLATELET
Abs Immature Granulocytes: 0.31 10*3/uL — ABNORMAL HIGH (ref 0.00–0.07)
Basophils Absolute: 0 10*3/uL (ref 0.0–0.1)
Basophils Relative: 0 %
Eosinophils Absolute: 0 10*3/uL (ref 0.0–0.5)
Eosinophils Relative: 0 %
HCT: 33.2 % — ABNORMAL LOW (ref 39.0–52.0)
Hemoglobin: 11.8 g/dL — ABNORMAL LOW (ref 13.0–17.0)
Immature Granulocytes: 3 %
Lymphocytes Relative: 6 %
Lymphs Abs: 0.8 10*3/uL (ref 0.7–4.0)
MCH: 31.7 pg (ref 26.0–34.0)
MCHC: 35.5 g/dL (ref 30.0–36.0)
MCV: 89.2 fL (ref 80.0–100.0)
Monocytes Absolute: 0.6 10*3/uL (ref 0.1–1.0)
Monocytes Relative: 5 %
Neutro Abs: 10.7 10*3/uL — ABNORMAL HIGH (ref 1.7–7.7)
Neutrophils Relative %: 86 %
Platelets: 373 10*3/uL (ref 150–400)
RBC: 3.72 MIL/uL — ABNORMAL LOW (ref 4.22–5.81)
RDW: 12.5 % (ref 11.5–15.5)
WBC: 12.4 10*3/uL — ABNORMAL HIGH (ref 4.0–10.5)
nRBC: 0 % (ref 0.0–0.2)

## 2022-04-18 LAB — BASIC METABOLIC PANEL
Anion gap: 8 (ref 5–15)
BUN: 10 mg/dL (ref 8–23)
CO2: 23 mmol/L (ref 22–32)
Calcium: 8.2 mg/dL — ABNORMAL LOW (ref 8.9–10.3)
Chloride: 100 mmol/L (ref 98–111)
Creatinine, Ser: 0.63 mg/dL (ref 0.61–1.24)
GFR, Estimated: >60 mL/min (ref >60)
Glucose, Bld: 159 mg/dL — ABNORMAL HIGH (ref 70–99)
Potassium: 4.3 mmol/L (ref 3.5–5.1)
Sodium: 131 mmol/L — ABNORMAL LOW (ref 135–145)

## 2022-04-18 MED ORDER — AMLODIPINE BESYLATE 10 MG PO TABS
10.0000 mg | ORAL_TABLET | Freq: Every day | ORAL | Status: DC
  Administered 2022-04-18 – 2022-04-21 (×4): 10 mg via ORAL
  Filled 2022-04-18 (×4): qty 1

## 2022-04-18 MED ORDER — HYDRALAZINE HCL 20 MG/ML IJ SOLN: 5.0000 mg | INTRAMUSCULAR | Status: DC | PRN

## 2022-04-18 MED ORDER — CARVEDILOL PHOSPHATE ER 40 MG PO CP24
60.0000 mg | ORAL_CAPSULE | Freq: Once | ORAL | Status: DC
  Filled 2022-04-18: qty 1

## 2022-04-18 MED ORDER — CARVEDILOL PHOSPHATE ER 20 MG PO CP24
20.0000 mg | ORAL_CAPSULE | Freq: Every day | ORAL | Status: DC
  Administered 2022-04-18: 20 mg via ORAL
  Filled 2022-04-18: qty 1

## 2022-04-18 MED ORDER — HYDRALAZINE HCL 20 MG/ML IJ SOLN: 10.0000 mg | INTRAMUSCULAR | Status: DC | PRN

## 2022-04-18 MED ORDER — CARVEDILOL PHOSPHATE ER 20 MG PO CP24
80.0000 mg | ORAL_CAPSULE | Freq: Every day | ORAL | Status: DC
  Administered 2022-04-19 – 2022-04-21 (×3): 80 mg via ORAL
  Filled 2022-04-18 (×2): qty 4
  Filled 2022-04-18: qty 1

## 2022-04-18 MED ORDER — ASPIRIN 81 MG PO TBEC
81.0000 mg | DELAYED_RELEASE_TABLET | Freq: Every day | ORAL | Status: DC
  Administered 2022-04-18 – 2022-04-21 (×4): 81 mg via ORAL
  Filled 2022-04-18 (×4): qty 1

## 2022-04-18 MED ORDER — CARVEDILOL PHOSPHATE ER 20 MG PO CP24
60.0000 mg | ORAL_CAPSULE | Freq: Once | ORAL | Status: AC
  Administered 2022-04-18: 60 mg via ORAL
  Filled 2022-04-18: qty 3

## 2022-04-18 MED FILL — Bupivacaine HCl Preservative Free (PF) Inj 0.25%: INTRAMUSCULAR | Qty: 30 | Status: AC

## 2022-04-18 MED FILL — Gentamicin Sulfate Inj 40 MG/ML: INTRAMUSCULAR | Qty: 80 | Status: AC

## 2022-04-18 MED FILL — Nitroglycerin IV Soln 100 MCG/ML in D5W: INTRA_ARTERIAL | Qty: 10 | Status: AC

## 2022-04-18 NOTE — Progress Notes (Signed)
[facility] for Infectious Disease  Date of Admission:  04/09/2022           Reason for visit: Follow up on GBS bacteremia and endocarditis  Current antibiotics: Penicillin G  ASSESSMENT:    76 y.o. 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  Status post device extraction with EP on 04/17/2022 with implant of a Micra leadless pacemaker at that time as well.  Blood cultures cleared as of 04/10/22. He is clinically doing well and tolerating antibiotics at this time.  RECOMMENDATIONS:    Will continue with IV penicillin 24,000,000 units daily Place PICC line See OPAT note below Will sign off, please call as needed  Diagnosis: CIED infection and endocarditis  Culture Result: Group B strep  Allergies  Allergen Reactions   Sulfa Antibiotics Hives   Feldene [Piroxicam] Hives    OPAT Orders Discharge antibiotics to be given via PICC line Discharge antibiotics: Per pharmacy protocol  Penicillin G 24 million units daily as a continuous infusion   Duration: 6 weeks from PPM removal   End Date: 05/29/22  [facility] Care Per Protocol:  Home health RN for IV administration and teaching; PICC line care and labs.    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[place])   SIRS (systemic inflammatory response syndrome) ([place])   Sepsis ([place])   Streptococcal  bacteremia    MEDICATIONS:    Scheduled Meds:  sodium chloride   Intravenous Once   sodium chloride   Intravenous Once   amLODipine  10 mg Oral Daily   aspirin EC  81 mg Oral Daily   atorvastatin  40 mg Oral Daily   carvedilol  20 mg Oral Daily   divalproex  1,000 mg Oral QHS   dorzolamide  1 drop Right Eye BID   gabapentin  300 mg Oral BID   latanoprost  1 drop Right Eye BID   melatonin  10 mg Oral QHS   mometasone-formoterol  2 puff Inhalation BID   pantoprazole  40 mg Oral BID   QUEtiapine  300 mg Oral QHS   tamsulosin  0.4 mg Oral QHS   timolol  1 drop Right Eye BID   Continuous Infusions:  penicillin G potassium 12 Million Units in dextrose 5 % 500 mL continuous infusion 12 Million Units (04/18/22 1102)   PRN Meds:.acetaminophen **OR** acetaminophen, albuterol, hydrALAZINE, ondansetron **OR** ondansetron (ZOFRAN) IV  SUBJECTIVE:   24 hour events:  Patient underwent PPM system extraction yesterday with leadless pacemaker implant at the time of extraction.   No other acute events are noted. Afebrile, Tmax 97.8 Stable labs this morning Blood cultures cleared 12/11   Patient is feeling fine this morning.  She has no new complaints.  Review of Systems  All other systems reviewed and are negative.     OBJECTIVE:   Blood pressure (!) 181/78, pulse  95, temperature (!) 97.5 F (36.4 C), temperature source Oral, resp. rate (!) 24, height 6' (1.829 m), weight 127.4 kg, SpO2 94 %. Body mass index is 38.09 kg/m.  Physical Exam Constitutional:      Appearance: He is well-developed.  Eyes:     Extraocular Movements: Extraocular movements intact.  Cardiovascular:     Comments: There is a clean dry bandage over his prior PPM site Pulmonary:     Effort: Pulmonary effort is normal. No respiratory distress.  Abdominal:     General: Bowel sounds are normal.     Palpations: Abdomen is soft.  Musculoskeletal:        General: Normal range of motion.     Cervical back:  Normal range of motion and neck supple.  Skin:    General: Skin is warm and dry.  Neurological:     General: No focal deficit present.     Mental Status: He is alert and oriented to person, place, and time.  Psychiatric:        Mood and Affect: Mood normal.        Behavior: Behavior normal.      Lab Results: Lab Results  Component Value Date   WBC 12.4 (H) 04/18/2022   HGB 11.8 (L) 04/18/2022   HCT 33.2 (L) 04/18/2022   MCV 89.2 04/18/2022   PLT 373 04/18/2022    Lab Results  Component Value Date   NA 131 (L) 04/18/2022   K 4.3 04/18/2022   CO2 23 04/18/2022   GLUCOSE 159 (H) 04/18/2022   BUN 10 04/18/2022   CREATININE 0.63 04/18/2022   CALCIUM 8.2 (L) 04/18/2022   GFRNONAA >60 04/18/2022   GFRAA >60 12/01/2019    Lab Results  Component Value Date   ALT 26 04/10/2022   AST 56 (H) 04/10/2022   ALKPHOS 52 04/10/2022   BILITOT 0.5 04/10/2022    No results found for: "CRP"  No results found for: "ESRSEDRATE"   I have reviewed the micro and lab results in Epic.  Imaging: DG Chest 2 View  Result Date: 04/18/2022 CLINICAL DATA:  76 year old male with shortness of breath. "Pacemaker infection". EXAM: CHEST - 2 VIEW COMPARISON:  Portable chest 04/12/2022. FINDINGS: Seated upright AP and lateral views of the chest at 0623 hours. Respiratory motion on the lateral. Left chest dual lead pacemaker has been removed. There is a cylindrical hyperdense object now projecting over the anterior cardiac apex. Stable cardiac size and mediastinal contours. Lower lung volumes. Contralateral right chest generator device with electrical lead coursing up into the neck appears stable. No pneumothorax or pulmonary edema. No consolidation. Difficult to exclude small pleural effusions. No acute osseous abnormality identified. Visible bowel gas within normal limits. IMPRESSION: Dual lead left chest pacemaker removed with small cylindrical hyperdensity now projecting over the anterior cardiac apex.  But stable mediastinal contours. Lower lung volumes with mild atelectasis. Difficult to exclude small effusions. Electronically Signed   By: Genevie Ann M.D.   On: 04/18/2022 08:51   EP PPM/ICD IMPLANT  Result Date: 04/17/2022 CONCLUSIONS: 1. 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 Successful implantation of a Medtronic Micra leadless pacemaker. 3. No early apparent complications. 4. Interrupted sutures to be removed in 10-14 days     Imaging  independently reviewed in Epic.    Raynelle Highland for Infectious [facility] [phone] pager 04/18/2022, 2:01 PM

## 2022-04-18 NOTE — Progress Notes (Signed)
PHARMACY CONSULT NOTE FOR:  OUTPATIENT  PARENTERAL ANTIBIOTIC THERAPY (OPAT)  Indication: GBS ICD infection Regimen: Penicillin G 24 million units daily as a continuous infusion End date: 05/29/22  IV antibiotic discharge orders are pended. To discharging provider:  please sign these orders via discharge navigator,  Select New Orders & click on the button choice - Manage This Unsigned Work.    Thank you for allowing pharmacy to be a part of this patient's care.  Alycia Rossetti, PharmD, BCPS Infectious Diseases Clinical Pharmacist 04/18/2022 2:07 PM   **Pharmacist phone directory can now be found on [email or web address] ([email or web address]).  Listed under [facility].

## 2022-04-18 NOTE — Discharge Instructions (Signed)
DO NOT SHOWER, GET CHEST WOUND WET UNTIL CLEARED TO AT YOUR WOUND CHECK/STURE REMOVAL VISIT.  Post procedure care instructions (Right groin site care) No driving for 4 days. No lifting over 5 lbs for 1 week. No vigorous or sexual activity for 1 week. You may return to work/your usual activities on 04/25/22. Keep procedure site clean & dry. If you notice increased pain, swelling, bleeding or pus, call/return!  You may gently clean the area after 24 hours (no showering as above), but no soaking in baths/hot tubs for 1 week.

## 2022-04-18 NOTE — Progress Notes (Signed)
Rounding Note    Patient Name: Douglas Perkins. Date of Encounter: 04/18/2022  [facility] Cardiologist: Dr. Nehemiah Massed  Subjective   Feels well, no CP, SOB, site discomfort  Inpatient Medications    Scheduled Meds:  sodium chloride   Intravenous Once   sodium chloride   Intravenous Once   amLODipine  10 mg Oral Daily   aspirin EC  81 mg Oral Daily   atorvastatin  40 mg Oral Daily   carvedilol  20 mg Oral Daily   divalproex  1,000 mg Oral QHS   dorzolamide  1 drop Right Eye BID   gabapentin  300 mg Oral BID   gentamicin (GARAMYCIN) 80 mg in sodium chloride 0.9 % 500 mL irrigation  80 mg Irrigation Once   latanoprost  1 drop Right Eye BID   melatonin  10 mg Oral QHS   mometasone-formoterol  2 puff Inhalation BID   pantoprazole  40 mg Oral BID   QUEtiapine  300 mg Oral QHS   tamsulosin  0.4 mg Oral QHS   timolol  1 drop Right Eye BID   Continuous Infusions:  penicillin G potassium 12 Million Units in dextrose 5 % 500 mL continuous infusion 41.7 mL/hr at 04/17/22 2346   PRN Meds: acetaminophen **OR** acetaminophen, albuterol, hydrALAZINE, ondansetron **OR** ondansetron (ZOFRAN) IV   Vital Signs    Vitals:   04/17/22 2100 04/17/22 2354 04/18/22 0155 04/18/22 0325  BP: (!) 169/71 (!) 183/94 116/60 (!) 151/67  Pulse: 83 87 87 86  Resp: 20 17    Temp: 97.7 F (36.5 C) 97.6 F (36.4 C)  97.8 F (36.6 C)  TempSrc: Oral Oral  Oral  SpO2: 92% 96% 93% 92%  Weight:      Height:        Intake/Output Summary (Last 24 hours) at 04/18/2022 0726 Last data filed at 04/18/2022 0410 Gross per 24 hour  Intake 300 ml  Output 250 ml  Net 50 ml      04/11/2022    3:15 PM 04/09/2022    5:27 PM 03/03/2021    3:00 PM  Last 3 Weights  Weight (lbs) 280 lb 13.9 oz 214 lb 15.2 oz   Weight (kg) 127.4 kg 97.5 kg      Information is confidential and restricted. Go to Review Flowsheets to unlock data.      Telemetry    SR 70's -90's, occ PVCs - Personally  Reviewed  ECG    No new EKGs - Personally Reviewed  Physical Exam   GEN: No acute distress.   Neck: No JVD Cardiac: RRR, no murmurs, rubs, or gallops.  Respiratory: CTA b/l. GI: Soft, nontender, non-distended  MS: No edema; No deformity. Neuro:  Nonfocal  Psych: Normal affect   L chest would: sutures are in place, site is CDI Left groin: soft, nontender, no bleeding or hematoma  Labs    High Sensitivity Troponin:   Recent Labs  Lab 04/09/22 1737 04/09/22 2010  TROPONINIHS 33* 54*     Chemistry Recent Labs  Lab 04/15/22 0217 04/16/22 2337 04/18/22 0203  NA 132* 130* 131*  K 4.1 3.9 4.3  CL 97* 98 100  CO2 '26 25 23  '$ GLUCOSE 140* 143* 159*  BUN '9 10 10  '$ CREATININE 0.60* 0.58* 0.63  CALCIUM 8.7* 8.3* 8.2*  GFRNONAA >60 >60 >60  ANIONGAP '9 7 8    '$ Lipids No results for input(s): "CHOL", "TRIG", "HDL", "LABVLDL", "LDLCALC", "CHOLHDL" in the last 168  hours.  Hematology Recent Labs  Lab 04/16/22 0204 04/16/22 2337 04/18/22 0203  WBC 11.6* 11.2* 12.4*  RBC 3.69* 3.68* 3.72*  HGB 11.6* 11.7* 11.8*  HCT 33.7* 33.1* 33.2*  MCV 91.3 89.9 89.2  MCH 31.4 31.8 31.7  MCHC 34.4 35.3 35.5  RDW 12.8 12.6 12.5  PLT 295 329 373   Thyroid No results for input(s): "TSH", "FREET4" in the last 168 hours.  BNPNo results for input(s): "BNP", "PROBNP" in the last 168 hours.  DDimer No results for input(s): "DDIMER" in the last 168 hours.   Radiology    EP PPM/ICD IMPLANT  Result Date: 04/17/2022 CONCLUSIONS: 1. 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 Successful implantation of a Medtronic Micra leadless pacemaker. 3. No early apparent complications. 4. Interrupted sutures to be removed in 10-14 days    Cardiac Studies    04/14/22: TEE  1. No valvular vegetations noted; oscillating density right atrial lead  consistent with vegetation.   2. Left ventricular ejection fraction, by estimation, is 55 to 60%. The  left  ventricle has normal function. The left ventricle has no regional  wall motion abnormalities.   3. Right ventricular systolic function is normal. The right ventricular  size is normal.   4. No left atrial/left atrial appendage thrombus was detected.   5. The mitral valve is normal in structure. Trivial mitral valve  regurgitation.   6. The aortic valve is tricuspid. Aortic valve regurgitation is not  visualized. Aortic valve sclerosis is present, with no evidence of aortic  valve stenosis.   7. There is Moderate (Grade III) plaque involving the descending aorta.   8. Agitated saline contrast bubble study was negative, with no evidence  of any interatrial shunt.   Patient Profile     76 y.o. male w/PMHx of merkel cell cancer s/p radiation to R leg, PPM, OSA not on CPAP, TIA, and HTN    Admitted with fall/AMS septic ultimately with Groub B strep bacteremia and endocarditis by TEE  MDT dual chamber PPM implanted 07/02/2015 NOW REMOVED (04/17/2022)  Assessment & Plan    #CIED Infection #Bacteremia #GBS Sepsis  Improved. He is s/p extraction of his DDD PPM 04/17/2022 with implant of a Micra leadless pacemaker. Micra is in good position on the chest x ray this morning. Incisions are healing well. He will need sutures removed in 10-14 days. Antibiotics management per ID.  EP will sign off. Please call with questions/concerns.    Lysbeth Galas T. Quentin Ore, MD, [facility], [facility] Cardiac Electrophysiology

## 2022-04-18 NOTE — Progress Notes (Signed)
Pt's BP has been trending up since his arrival on the unit from PACU, latest BP was 183/94 MAP 121, HR sustaining in the 80's. Attempted to notify Claria Dice, MD & awaiting further orders.   Elaina Hoops, RN

## 2022-04-18 NOTE — Care Management (Signed)
1975 04-18-22 Case Manager received a secure chat from the [facility] that the patient cannot return to [facility] with the IV antibiotics. Case Manager called the [facility] with the [facility] on the phone to verify and [facility] states that the patient cannot return because they do not have nursing staff to check the PICC site and do assessments. Patient was initially set up with Alvis Lemmings and [facility] from the previous Case Manager. CSW will discuss with the patient regarding a higher level of care. Case Manager will continue to follow for additional transition of care needs.

## 2022-04-18 NOTE — Progress Notes (Addendum)
PROGRESS NOTE  Douglas Perkins. GOT:[id number] DOB: 1945-10-26 DOA: 04/09/2022 PCP: [name], MD  HPI/Recap of past 24 hours:  Douglas Perkins. is a 76 y.o. 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 Pt presents to ED from [place].  Was found on the ground after an unwitnessed fall. Was nauseous and diaphoretic and vomited once. Found to be altered initially and sats were around 90% so placed on nonrebreather. In the ED, noted to be septic, started on broad-spectrum antibiotics. Patient admitted for further management.  Workup was significant for GBS bacteremia, TEE is significant for pacemaker lead infection, status post pacemaker instruction 04/17/2022, with [place] pacemaker implant.  .  Subjective:  No chest pain, no shortness of breath, no fever, no chills.  Assessment/Plan: Principal Problem:   Pacemaker infection ([place]) Active Problems:   SIRS (systemic inflammatory response syndrome) (HCC)   Benign essential hypertension   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HCC)   Sepsis ([place])   Streptococcal bacteremia   Severe sepsis likely 2/2 Group B streptococcal bacteremia Pacemaker lead infection On admission leukocytosis, febrile, tachycardic, tachypneic, lactic acid of 3.5 BC X 2 growing group B streptococcus TTE showed EF of 60 to 65%, no regional wall motion abnormality, recommend TEE -TEE 12/15  significant for mobile mass on atrial lead, indicating pacemaker lead infection, initially patient resistant to pacemaker extraction, but currently agreeable. - Status post device extraction with EP on 04/17/2022 with implant of a Micra leadless pacemaker at that time as well.  -ID input greatly appreciated, continue with IV penicillin 24,000,000 units daily, end date 05/29/2022, will request PICC line insertion   Hypertension -Patient on 80 mg of Coreg CR at home, and amlodipine, blood pressure is elevated, so I  will increase his Coreg CR to home dose.  CAD/HLD Mildly elevated troponin-likely demand ischemia Echo noted as above Denies any chest pain Continue aspirin, Lipitor  Merkel cell carcinoma S/p radiation therapy CT showing area of inflammation in right groin RLE swollen with erythema, will order doppler  Obstructive sleep apnea Does have implantable inspire device  Obesity Lifestyle modification advised    Estimated body mass index is 38.09 kg/m as calculated from the following:   Height as of this encounter: 6' (1.829 m).   Weight as of this encounter: 127.4 kg.     Code Status: Full  Family Communication: None at bedside,  all patient's questions were answered, discussed with wife by phone  Disposition Plan: [facility], will check if they can accommodate his IV antibiotics needs.  Status is: Inpatient       Consultants: ID EP  Procedures: TEE 12/15 Status post device extraction with EP on 04/17/2022 with implant of a Micra leadless pacemaker   Antimicrobials: Penicillin G  DVT prophylaxis: Lovenox   Objective: Vitals:   04/18/22 0325 04/18/22 0856 04/18/22 0946 04/18/22 1222  BP: (!) 151/67 137/60 (!) 152/68 (!) 181/78  Pulse: 86  86 95  Resp:   (!) 21 (!) 24  Temp: 97.8 F (36.6 C)  97.7 F (36.5 C) (!) 97.5 F (36.4 C)  TempSrc: Oral  Oral Oral  SpO2: 92%  93% 94%  Weight:      Height:        Intake/Output Summary (Last 24 hours) at 04/18/2022 1444 Last data filed at 04/18/2022 0410 Gross per 24 hour  Intake 300 ml  Output 250 ml  Net 50  ml   Filed Weights   04/09/22 1727 04/11/22 1515  Weight: 97.5 kg 127.4 kg    Exam:  Awake Alert, Oriented X 3, No new F.N deficits, Normal affect Symmetrical Chest wall movement, Good air movement bilaterally, CTAB, left upper chest bandage present RRR,No Gallops,Rubs or new Murmurs, No Parasternal Heave +ve B.Sounds, Abd Soft, No tenderness, No rebound - guarding or  rigidity No Cyanosis, Clubbing or edema, chronic right lower extremity skin changes from previous radiation.  Left groin  bandaged    Data Reviewed: CBC: Recent Labs  Lab 04/14/22 0307 04/15/22 0217 04/16/22 0204 04/16/22 2337 04/18/22 0203  WBC 9.5 11.7* 11.6* 11.2* 12.4*  NEUTROABS 5.6 7.7 7.2 7.1 10.7*  HGB 12.2* 12.6* 11.6* 11.7* 11.8*  HCT 36.5* 37.3* 33.7* 33.1* 33.2*  MCV 92.6 91.6 91.3 89.9 89.2  PLT 259 302 295 329 242   Basic Metabolic Panel: Recent Labs  Lab 04/13/22 0430 04/14/22 0307 04/15/22 0217 04/16/22 2337 04/18/22 0203  NA 134* 131* 132* 130* 131*  K 4.5 4.0 4.1 3.9 4.3  CL 100 96* 97* 98 100  CO2 21* '25 26 25 23  '$ GLUCOSE 133* 130* 140* 143* 159*  BUN '10 11 9 10 10  '$ CREATININE 0.62 0.66 0.60* 0.58* 0.63  CALCIUM 8.6* 8.5* 8.7* 8.3* 8.2*   GFR: Estimated Creatinine Clearance: 104.9 mL/min (by C-G formula based on SCr of 0.63 mg/dL). Liver Function Tests: No results for input(s): "AST", "ALT", "ALKPHOS", "BILITOT", "PROT", "ALBUMIN" in the last 168 hours.  No results for input(s): "LIPASE", "AMYLASE" in the last 168 hours.  No results for input(s): "AMMONIA" in the last 168 hours. Coagulation Profile: No results for input(s): "INR", "PROTIME" in the last 168 hours.  Cardiac Enzymes: No results for input(s): "CKTOTAL", "CKMB", "CKMBINDEX", "TROPONINI" in the last 168 hours.  BNP (last 3 results) No results for input(s): "PROBNP" in the last 8760 hours. HbA1C: No results for input(s): "HGBA1C" in the last 72 hours. CBG: No results for input(s): "GLUCAP" in the last 168 hours. Lipid Profile: No results for input(s): "CHOL", "HDL", "LDLCALC", "TRIG", "CHOLHDL", "LDLDIRECT" in the last 72 hours. Thyroid Function Tests: No results for input(s): "TSH", "T4TOTAL", "FREET4", "T3FREE", "THYROIDAB" in the last 72 hours. Anemia Panel: No results for input(s): "VITAMINB12", "FOLATE", "FERRITIN", "TIBC", "IRON", "RETICCTPCT" in the last 72  hours. Urine analysis:    Component Value Date/Time   COLORURINE YELLOW 04/09/2022 2135   APPEARANCEUR HAZY (A) 04/09/2022 2135   APPEARANCEUR Clear 05/24/2015 1328   LABSPEC 1.017 04/09/2022 2135   PHURINE 6.0 04/09/2022 2135   GLUCOSEU NEGATIVE 04/09/2022 2135   HGBUR SMALL (A) 04/09/2022 2135   BILIRUBINUR NEGATIVE 04/09/2022 2135   BILIRUBINUR Negative 05/24/2015 [place] 04/09/2022 2135   PROTEINUR 30 (A) 04/09/2022 2135   NITRITE NEGATIVE 04/09/2022 2135   LEUKOCYTESUR NEGATIVE 04/09/2022 2135   Sepsis Labs: '@LABRCNTIP'$ (procalcitonin:4,lacticidven:4)  ) Recent Results (from the past 240 hour(s))  Resp panel by RT-PCR (RSV, Flu A&B, Covid) Anterior Nasal Swab     Status: None   Collection Time: 04/09/22  5:18 PM   Specimen: Anterior Nasal Swab  Result Value Ref Range Status   SARS Coronavirus 2 by RT PCR NEGATIVE NEGATIVE Final    Comment: (NOTE) SARS-CoV-2 target nucleic acids are NOT DETECTED.  The SARS-CoV-2 RNA is generally detectable in upper respiratory specimens during the acute phase of infection. The lowest concentration of SARS-CoV-2 viral copies this assay can detect is 138 copies/mL. A negative result does not  preclude SARS-Cov-2 infection and should not be used as the sole basis for treatment or other patient management decisions. A negative result may occur with  improper specimen collection/handling, submission of specimen other than nasopharyngeal swab, presence of viral mutation(s) within the areas targeted by this assay, and inadequate number of viral copies(<138 copies/mL). A negative result must be combined with clinical observations, patient history, and epidemiological information. The expected result is Negative.  Fact Sheet for Patients:  EntrepreneurPulse.com.au  Fact Sheet for Healthcare Providers:  IncredibleEmployment.be  This test is no t yet approved or cleared by the [place]  FDA and  has been authorized for detection and/or diagnosis of SARS-CoV-2 by FDA under an Emergency Use Authorization (EUA). This EUA will remain  in effect (meaning this test can be used) for the duration of the COVID-19 declaration under Section 564(b)(1) of the Act, 21 U.S.C.section 360bbb-3(b)(1), unless the authorization is terminated  or revoked sooner.       Influenza A by PCR NEGATIVE NEGATIVE Final   Influenza B by PCR NEGATIVE NEGATIVE Final    Comment: (NOTE) The Xpert Xpress SARS-CoV-2/FLU/RSV plus assay is intended as an aid in the diagnosis of influenza from Nasopharyngeal swab specimens and should not be used as a sole basis for treatment. Nasal washings and aspirates are unacceptable for Xpert Xpress SARS-CoV-2/FLU/RSV testing.  Fact Sheet for Patients: EntrepreneurPulse.com.au  Fact Sheet for Healthcare Providers: IncredibleEmployment.be  This test is not yet approved or cleared by the [place] FDA and has been authorized for detection and/or diagnosis of SARS-CoV-2 by FDA under an Emergency Use Authorization (EUA). This EUA will remain in effect (meaning this test can be used) for the duration of the COVID-19 declaration under Section 564(b)(1) of the Act, 21 U.S.C. section 360bbb-3(b)(1), unless the authorization is terminated or revoked.     Resp Syncytial Virus by PCR NEGATIVE NEGATIVE Final    Comment: (NOTE) Fact Sheet for Patients: EntrepreneurPulse.com.au  Fact Sheet for Healthcare Providers: IncredibleEmployment.be  This test is not yet approved or cleared by the [place] FDA and has been authorized for detection and/or diagnosis of SARS-CoV-2 by FDA under an Emergency Use Authorization (EUA). This EUA will remain in effect (meaning this test can be used) for the duration of the COVID-19 declaration under Section 564(b)(1) of the Act, 21 U.S.C. section  360bbb-3(b)(1), unless the authorization is terminated or revoked.  Performed at [facility], [place] [address] [place],  [address]   Culture, blood (routine x 2)     Status: Abnormal   Collection Time: 04/09/22  5:24 PM   Specimen: BLOOD RIGHT HAND  Result Value Ref Range Status   Specimen Description BLOOD RIGHT HAND  Final   Special Requests   Final    BOTTLES DRAWN AEROBIC AND ANAEROBIC Blood Culture adequate volume   Culture  Setup Time   Final    GRAM POSITIVE COCCI IN CHAINS IN BOTH AEROBIC AND ANAEROBIC BOTTLES CRITICAL RESULT CALLED TO, READ BACK BY AND VERIFIED WITH: [place] O [phone] [id number] FCP Performed at [facility], [place] [address] [place],  [address]    Culture GROUP B STREP(S.AGALACTIAE)ISOLATED (A)  Final   Report Status 04/12/2022 FINAL  Final   Organism ID, Bacteria GROUP B STREP(S.AGALACTIAE)ISOLATED  Final      Susceptibility   Group b strep(s.agalactiae)isolated - MIC*    CLINDAMYCIN >=1 RESISTANT Resistant     AMPICILLIN <=0.25 SENSITIVE Sensitive     ERYTHROMYCIN >=8 RESISTANT Resistant  VANCOMYCIN 0.5 SENSITIVE Sensitive     CEFTRIAXONE <=0.12 SENSITIVE Sensitive     LEVOFLOXACIN 1 SENSITIVE Sensitive     PENICILLIN <=0.06 SENSITIVE Sensitive     * GROUP B STREP(S.AGALACTIAE)ISOLATED  Blood Culture ID Panel (Reflexed)     Status: Abnormal   Collection Time: 04/09/22  5:24 PM  Result Value Ref Range Status   Enterococcus faecalis NOT DETECTED NOT DETECTED Final   Enterococcus Faecium NOT DETECTED NOT DETECTED Final   Listeria monocytogenes NOT DETECTED NOT DETECTED Final   Staphylococcus species NOT DETECTED NOT DETECTED Final   Staphylococcus aureus (BCID) NOT DETECTED NOT DETECTED Final   Staphylococcus epidermidis NOT DETECTED NOT DETECTED Final   Staphylococcus lugdunensis NOT DETECTED NOT DETECTED Final   Streptococcus species DETECTED (A) NOT DETECTED Final    Comment: CRITICAL RESULT CALLED TO, READ BACK BY  AND VERIFIED WITH: PHARMD ELIZABETH M [id number] FCP    Streptococcus agalactiae DETECTED (A) NOT DETECTED Final    Comment: CRITICAL RESULT CALLED TO, READ BACK BY AND VERIFIED WITH: PHARMD ELIZABETH M [id number] FCP    Streptococcus pneumoniae NOT DETECTED NOT DETECTED Final   Streptococcus pyogenes NOT DETECTED NOT DETECTED Final   A.calcoaceticus-baumannii NOT DETECTED NOT DETECTED Final   Bacteroides fragilis NOT DETECTED NOT DETECTED Final   Enterobacterales NOT DETECTED NOT DETECTED Final   Enterobacter cloacae complex NOT DETECTED NOT DETECTED Final   Escherichia coli NOT DETECTED NOT DETECTED Final   Klebsiella aerogenes NOT DETECTED NOT DETECTED Final   Klebsiella oxytoca NOT DETECTED NOT DETECTED Final   Klebsiella pneumoniae NOT DETECTED NOT DETECTED Final   Proteus species NOT DETECTED NOT DETECTED Final   Salmonella species NOT DETECTED NOT DETECTED Final   Serratia marcescens NOT DETECTED NOT DETECTED Final   Haemophilus influenzae NOT DETECTED NOT DETECTED Final   Neisseria meningitidis NOT DETECTED NOT DETECTED Final   Pseudomonas aeruginosa NOT DETECTED NOT DETECTED Final   Stenotrophomonas maltophilia NOT DETECTED NOT DETECTED Final   Candida albicans NOT DETECTED NOT DETECTED Final   Candida auris NOT DETECTED NOT DETECTED Final   Candida glabrata NOT DETECTED NOT DETECTED Final   Candida krusei NOT DETECTED NOT DETECTED Final   Candida parapsilosis NOT DETECTED NOT DETECTED Final   Candida tropicalis NOT DETECTED NOT DETECTED Final   Cryptococcus neoformans/gattii NOT DETECTED NOT DETECTED Final    Comment: Performed at [facility], [address], [place], [place] [address]  Culture, blood (routine x 2)     Status: Abnormal   Collection Time: 04/09/22  5:37 PM   Specimen: BLOOD LEFT HAND  Result Value Ref Range Status   Specimen Description BLOOD LEFT HAND  Final   Special Requests   Final    BOTTLES DRAWN AEROBIC AND ANAEROBIC Blood Culture  adequate volume   Culture  Setup Time   Final    GRAM POSITIVE COCCI IN CHAINS IN BOTH AEROBIC AND ANAEROBIC BOTTLES CRITICAL VALUE NOTED.  VALUE IS CONSISTENT WITH PREVIOUSLY REPORTED AND CALLED VALUE.    Culture (A)  Final    GROUP B STREP(S.AGALACTIAE)ISOLATED SUSCEPTIBILITIES PERFORMED ON PREVIOUS CULTURE WITHIN THE LAST 5 DAYS. Performed at [facility], [place] [address], [place], [place] [address]    Report Status 04/12/2022 FINAL  Final  MRSA Next Gen by PCR, Nasal     Status: None   Collection Time: 04/10/22  6:25 AM   Specimen: Nasal Mucosa; Nasal Swab  Result Value Ref Range Status   MRSA by PCR  Next Gen NOT DETECTED NOT DETECTED Final    Comment: (NOTE) The GeneXpert MRSA Assay (FDA approved for NASAL specimens only), is one component of a comprehensive MRSA colonization surveillance program. It is not intended to diagnose MRSA infection nor to guide or monitor treatment for MRSA infections. Test performance is not FDA approved in patients less than 41 years old. Performed at [facility], [place] [address] [place], [place] [address]   Culture, blood (Routine X 2) w Reflex to ID Panel     Status: None   Collection Time: 04/10/22  1:33 PM   Specimen: BLOOD RIGHT HAND  Result Value Ref Range Status   Specimen Description BLOOD RIGHT HAND  Final   Special Requests   Final    BOTTLES DRAWN AEROBIC AND ANAEROBIC Blood Culture adequate volume   Culture   Final    NO GROWTH 5 DAYS Performed at [facility], [place] [address] [place], [place] [address]    Report Status 04/16/2022 FINAL  Final      Studies: [place] EKG SITE RITE  Result Date: 04/18/2022 If Site Rite image not attached, placement could not be confirmed due to current cardiac rhythm.  DG Chest 2 View  Result Date: 04/18/2022 CLINICAL DATA:  76 year old male with shortness of breath. "Pacemaker infection". EXAM: CHEST - 2 VIEW COMPARISON:  Portable chest 04/12/2022. FINDINGS: Seated upright AP  and lateral views of the chest at 0623 hours. Respiratory motion on the lateral. Left chest dual lead pacemaker has been removed. There is a cylindrical hyperdense object now projecting over the anterior cardiac apex. Stable cardiac size and mediastinal contours. Lower lung volumes. Contralateral right chest generator device with electrical lead coursing up into the neck appears stable. No pneumothorax or pulmonary edema. No consolidation. Difficult to exclude small pleural effusions. No acute osseous abnormality identified. Visible bowel gas within normal limits. IMPRESSION: Dual lead left chest pacemaker removed with small cylindrical hyperdensity now projecting over the anterior cardiac apex. But stable mediastinal contours. Lower lung volumes with mild atelectasis. Difficult to exclude small effusions. Electronically Signed   By: Genevie Ann M.D.   On: 04/18/2022 08:51   EP PPM/ICD IMPLANT  Result Date: 04/17/2022 CONCLUSIONS: 1. 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 Successful implantation of a Medtronic Micra leadless pacemaker. 3. No early apparent complications. 4. Interrupted sutures to be removed in 10-14 days    Scheduled Meds:  sodium chloride   Intravenous Once   sodium chloride   Intravenous Once   amLODipine  10 mg Oral Daily   aspirin EC  81 mg Oral Daily   atorvastatin  40 mg Oral Daily   carvedilol  20 mg Oral Daily   divalproex  1,000 mg Oral QHS   dorzolamide  1 drop Right Eye BID   gabapentin  300 mg Oral BID   latanoprost  1 drop Right Eye BID   melatonin  10 mg Oral QHS   mometasone-formoterol  2 puff Inhalation BID   pantoprazole  40 mg Oral BID   QUEtiapine  300 mg Oral QHS   tamsulosin  0.4 mg Oral QHS   timolol  1 drop Right Eye BID    Continuous Infusions:  penicillin G potassium 12 Million Units in dextrose 5 % 500 mL continuous infusion 12 Million Units (04/18/22 1102)     LOS: 8 days     Phillips Climes, MD [facility]  If 7PM-7AM, please contact night-coverage  [email or web address] 04/18/2022, 2:44 PM

## 2022-04-18 NOTE — Progress Notes (Signed)
Device check this Am with stable measurements Wound care and activity restrictions were reviewed with the patient CXR is without PTX Follow up is in place. Tommye Standard, PA-C

## 2022-04-18 NOTE — TOC Initial Note (Addendum)
Transition of Care (TOC) - Initial/Assessment Note    Patient Details  Name: Douglas Perkins. MRN: [id number] Date of Birth: 1945-10-27  Transition of Care [facility]) CM/SW Contact:    Milas Gain, [place] Phone Number: 04/18/2022, 2:38 PM  Clinical Narrative:                  Patient spouse confirmed patient is from [facility].   MD informed CM and CSW that patient will need IV antibiotics via PICC line can patient return back to ALF with IV antibiotics via PICC line  .Update-CM with CSW spoke with Otila Kluver with ALF who confirmed ALF will not be able to accept patient back with IV antibiotics via PICC line. Tina informed  CM and CSW patient will need higher level of care.CSW and CM informed MD. TOC will continue to follow.       Patient Goals and CMS Choice        Expected Discharge Plan and Services                                                Prior Living Arrangements/Services                       Activities of Daily Living Home Assistive Devices/Equipment: None ADL Screening (condition at time of admission) Patient's cognitive ability adequate to safely complete daily activities?: Yes Is the patient deaf or have difficulty hearing?: Yes Does the patient have difficulty seeing, even when wearing glasses/contacts?: No Does the patient have difficulty concentrating, remembering, or making decisions?: No Patient able to express need for assistance with ADLs?: No Does the patient have difficulty dressing or bathing?: No Independently performs ADLs?: Yes (appropriate for developmental age) Does the patient have difficulty walking or climbing stairs?: Yes Weakness of Legs: Both Weakness of Arms/Hands: None  Permission Sought/Granted                  Emotional Assessment              Admission diagnosis:  Unresponsive episode [R40.4] Sepsis ([place]) [A41.9] Acute sepsis [facility]) [A41.9] Patient Active Problem List   Diagnosis Date  Noted   Pacemaker infection ([place]) 04/12/2022   Streptococcal bacteremia 04/12/2022   Sepsis ([place]) 04/10/2022   Prediabetes 03/06/2021   Bipolar affective disorder, current episode manic ([place]) 03/03/2021   Pressure injury of skin 41/96/2229   Acute metabolic encephalopathy 79/89/2119   Hyponatremia syndrome 02/18/2021   OSA (obstructive sleep apnea) 11/26/2019   Cardiac pacemaker 04/30/2019   History of sinoatrial node dysfunction 04/30/2019   Other male erectile dysfunction 11/12/2018   Near syncope 05/22/2017   Coronary artery disease involving native coronary artery of native heart 01/10/2017   Low serum testosterone level 06/16/2016   Obesity (BMI 30-39.9) 03/09/2016   Syncope 07/19/2015   SIRS (systemic inflammatory response syndrome) ([place]) 07/07/2015   Bipolar I disorder ([place]) 06/24/2015   Bilateral pneumonia 06/23/2015   Bipolar affective disorder, currently manic, mild ([place]) 06/18/2015   History of duodenal ulcer 06/18/2015   Vaccine counseling 06/18/2015   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[place]) 06/07/2015   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   Bradycardia 02/08/2015   Hyperlipidemia, mixed 10/14/2014   AF (amaurosis fugax) 09/16/2014   Benign essential hypertension 09/16/2014   Chest pain 09/16/2014   Skin cyst 08/06/2012  Personal history of lymphoma 08/06/2012   Cancer [facility])    PCP:  [name], MD Pharmacy:   [facility] [place], [place] - [address] [place] [address] Phone: [phone] Fax: [phone]  [id number] [address], [place] [address] [place] [address] Phone: [phone] Fax: [phone]     Social Determinants of Health (SDOH) Interventions Housing Interventions: Intervention Not Indicated  Readmission Risk Interventions     No data to display

## 2022-04-19 ENCOUNTER — Other Ambulatory Visit (HOSPITAL_COMMUNITY): Payer: Self-pay

## 2022-04-19 DIAGNOSIS — T827XXA Infection and inflammatory reaction due to other cardiac and vascular devices, implants and grafts, initial encounter: Secondary | ICD-10-CM | POA: Diagnosis not present

## 2022-04-19 LAB — CBC WITH DIFFERENTIAL/PLATELET
Abs Immature Granulocytes: 0.25 10*3/uL — ABNORMAL HIGH (ref 0.00–0.07)
Basophils Absolute: 0 10*3/uL (ref 0.0–0.1)
Basophils Relative: 0 %
Eosinophils Absolute: 0.1 10*3/uL (ref 0.0–0.5)
Eosinophils Relative: 1 %
HCT: 33 % — ABNORMAL LOW (ref 39.0–52.0)
Hemoglobin: 11.5 g/dL — ABNORMAL LOW (ref 13.0–17.0)
Immature Granulocytes: 3 %
Lymphocytes Relative: 17 %
Lymphs Abs: 1.6 10*3/uL (ref 0.7–4.0)
MCH: 31.8 pg (ref 26.0–34.0)
MCHC: 34.8 g/dL (ref 30.0–36.0)
MCV: 91.2 fL (ref 80.0–100.0)
Monocytes Absolute: 1.3 10*3/uL — ABNORMAL HIGH (ref 0.1–1.0)
Monocytes Relative: 14 %
Neutro Abs: 5.9 10*3/uL (ref 1.7–7.7)
Neutrophils Relative %: 65 %
Platelets: 358 10*3/uL (ref 150–400)
RBC: 3.62 MIL/uL — ABNORMAL LOW (ref 4.22–5.81)
RDW: 12.8 % (ref 11.5–15.5)
WBC: 9.3 10*3/uL (ref 4.0–10.5)
nRBC: 0 % (ref 0.0–0.2)

## 2022-04-19 MED ORDER — CHLORHEXIDINE GLUCONATE CLOTH 2 % EX PADS
6.0000 | MEDICATED_PAD | Freq: Every day | CUTANEOUS | Status: DC
  Administered 2022-04-19 – 2022-04-21 (×3): 6 via TOPICAL

## 2022-04-19 MED ORDER — SODIUM CHLORIDE 0.9% FLUSH
10.0000 mL | Freq: Two times a day (BID) | INTRAVENOUS | Status: DC
  Administered 2022-04-20 – 2022-04-21 (×3): 10 mL

## 2022-04-19 MED ORDER — SODIUM CHLORIDE 0.9% FLUSH: 10.0000 mL | INTRAVENOUS | Status: DC | PRN

## 2022-04-19 NOTE — Progress Notes (Signed)
Peripherally Inserted Central Catheter Placement  The IV Nurse has discussed with the patient and/or persons authorized to consent for the patient, the purpose of this procedure and the potential benefits and risks involved with this procedure.  The benefits include less needle sticks, lab draws from the catheter, and the patient may be discharged home with the catheter. Risks include, but not limited to, infection, bleeding, blood clot (thrombus formation), and puncture of an artery; nerve damage and irregular heartbeat and possibility to perform a PICC exchange if needed/ordered by physician.  Alternatives to this procedure were also discussed.  Bard Power PICC patient education guide, fact sheet on infection prevention and patient information card has been provided to patient /or left at bedside.    PICC Placement Documentation  PICC Single Lumen 41/58/30 Right Basilic 48 cm 1 cm (Active)  Indication for Insertion or Continuance of Line Prolonged intravenous therapies 04/19/22 1400  Exposed Catheter (cm) 1 cm 04/19/22 1400  Site Assessment Clean, Dry, Intact 04/19/22 1400  Line Status Flushed;Saline locked;Blood return noted 04/19/22 1400  Dressing Type Transparent;Securing device 04/19/22 1400  Dressing Status Antimicrobial disc in place 04/19/22 1400  Safety Lock Not Applicable 94/07/68 0881  Line Care Connections checked and tightened 04/19/22 1400  Dressing Intervention New dressing 04/19/22 1400  Dressing Change Due 04/26/22 04/19/22 1400       Holley Bouche Renee 04/19/2022, 2:25 PM

## 2022-04-19 NOTE — Progress Notes (Signed)
Physical Therapy Treatment Patient Details Name: Douglas Perkins. MRN: [id number] DOB: [birth year] Today's Date: 04/19/2022   History of Present Illness Pt [facility] from [facility] with unwitnessed fall. Found to have Severe sepsis,  Pacemaker lead infection  .  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    PT Comments    Pt agreeable to working with therapy but reports 4/10 OA pain in shoulders, hip, knees. Inquired of RN and stated she gave him pain medication earlier this morning. PT encouraged mobility for decreased OA pain. Pt agrees. Performed bed level exercises to warm up but brought on 10/10 cramping pain in R thigh and groin. Pt then requires modA for bed mobility, and transfers to RW. Pt is min A for ambulation from bed to chair. PT recommending SNF level rehab for strengthening, balance and ROM. Will need to go to SNF for PICC line management anyway.     Recommendations for follow up therapy are one component of a multi-disciplinary discharge planning process, led by the attending physician.  Recommendations may be updated based on patient status, additional functional criteria and insurance authorization.  Follow Up Recommendations  Skilled nursing-short term rehab (<3 hours/day) (requires nursing staff for PICC line management) Can patient physically be transported by private vehicle: No   Assistance Recommended at Discharge Intermittent Supervision/Assistance  Patient can return home with the following Assistance with cooking/housework;Assist for transportation;A lot of help with walking and/or transfers;A lot of help with bathing/dressing/bathroom;Direct supervision/assist for medications management   Equipment Recommendations  None recommended by PT       Precautions / Restrictions Precautions Precautions: Fall Restrictions Weight Bearing Restrictions: No     Mobility  Bed Mobility Overal bed mobility: Needs  Assistance Bed Mobility: Supine to Sit     Supine to sit: HOB elevated, Mod assist     General bed mobility comments: modA to come to EoB, pt with difficulty pulling up to PT with R hand due to A line dressing and decreased L UE use due to pacemaker incisional site tightness, pt able to manage LE to EOB and requires modA for bringing trunk to upright    Transfers Overall transfer level: Needs assistance Equipment used: Rolling walker (2 wheels) Transfers: Sit to/from Stand Sit to Stand: Mod assist, From elevated surface           General transfer comment: vc for hand placement to power up, requires modA for power up and steadying, vc for gluteal activation for posterior hip rotation    Ambulation/Gait Ambulation/Gait assistance: Min assist Gait Distance (Feet): 5 Feet Assistive device: Rolling walker (2 wheels) Gait Pattern/deviations: Step-through pattern, Decreased step length - right, Decreased step length - left, Shuffle, Trunk flexed Gait velocity: slowed Gait velocity interpretation: <1.31 ft/sec, indicative of household ambulator   General Gait Details: min A for steadying with very slowed, shuffling steps from bed to recliner         Balance Overall balance assessment: Needs assistance Sitting-balance support: Feet supported, No upper extremity supported Sitting balance-Leahy Scale: Fair Sitting balance - Comments: supervision when EOB   Standing balance support: Bilateral upper extremity supported, During functional activity Standing balance-Leahy Scale: Poor Standing balance comment: requires UE for support                            Cognition Arousal/Alertness: Awake/alert Behavior During Therapy: WFL for tasks assessed/performed Overall Cognitive  Status: No family/caregiver present to determine baseline cognitive functioning                                 General Comments: Likely near baseline cognition. Very pleasant and  conversational. Providing detailed description of how [facility] assists [facility]. Following all commands with increased time        Exercises General Exercises - Lower Extremity Ankle Circles/Pumps: 10 reps, AROM, Both Heel Slides: AROM, Left, 10 reps, Right, 5 reps (experiences extreme cramping in thigh with heels slides, recommend more movement in RLE to decrease cramping when he does moven)    General Comments General comments (skin integrity, edema, etc.): HR high 80s to 100s with ambulation, 3/4 DoE, SpO2 in 90%s      Pertinent Vitals/Pain Pain Assessment Pain Assessment: 0-10 Pain Score: 4  Breathing: normal Negative Vocalization: none Facial Expression: smiling or inexpressive Body Language: relaxed Consolability: no need to console PAINAD Score: 0 Pain Location: R thigh from radiation years ago, generalize OA joint pain from not moving 4/10 in bed 3/10 after moving Pain Descriptors / Indicators: Discomfort, Sore, Grimacing, Guarding Pain Intervention(s): Limited activity within patient's tolerance, Monitored during session, Repositioned     PT Goals (current goals can now be found in the care plan section) Acute Rehab PT Goals Patient Stated Goal: Go back to ALF PT Goal Formulation: With patient Time For Goal Achievement: 04/30/22 Potential to Achieve Goals: Good Progress towards PT goals: Not progressing toward goals - comment    Frequency    Min 3X/week      PT Plan Discharge plan needs to be updated       AM-PAC PT "6 Clicks" Mobility   Outcome Measure  Help needed turning from your back to your side while in a flat bed without using bedrails?: A Little Help needed moving from lying on your back to sitting on the side of a flat bed without using bedrails?: A Little Help needed moving to and from a bed to a chair (including a wheelchair)?: A Little Help needed standing up from a chair using your arms (e.g., wheelchair or bedside chair)?: A  Little Help needed to walk in hospital room?: A Lot Help needed climbing 3-5 steps with a railing? : A Lot 6 Click Score: 16    End of Session Equipment Utilized During Treatment: Gait belt Activity Tolerance: Patient tolerated treatment well Patient left: in chair;with call bell/phone within reach Nurse Communication: Mobility status PT Visit Diagnosis: Unsteadiness on feet (R26.81);Other abnormalities of gait and mobility (R26.89);Muscle weakness (generalized) (M62.81);History of falling (Z91.81);Difficulty in walking, not elsewhere classified (R26.2)     Time: 3570-1779 PT Time Calculation (min) (ACUTE ONLY): 35 min  Charges:  $Therapeutic Exercise: 8-22 mins $Therapeutic Activity: 8-22 mins                     Jahmir Salo B. Migdalia Dk PT, DPT Acute Rehabilitation Services Please use secure chat or  Call Office [phone]    [place] 04/19/2022, 12:05 PM

## 2022-04-19 NOTE — TOC Progression Note (Signed)
Transition of Care (TOC) - Initial/Assessment Note    Patient Details  Name: Douglas Perkins. MRN: [id number] Date of Birth: Dec 19, 1945  Transition of Care [facility]) CM/SW Contact:    Milinda Antis, LCSWA Phone Number: 04/19/2022, 1:33 PM  Clinical Narrative:                 RE: Douglas Perkins, Douglas Perkins.  Date of Birth: [birth year] Date: 04/19/2022  Please be advised that the above-named patient will require a short-term nursing home stay - anticipated 30 days or less for rehabilitation and strengthening.  The plan is for return home.  Planned Disposition: Independence     Patient Goals and CMS Choice            Expected Discharge Plan and Services Planned Disposition: [place]                                              Prior Living Arrangements/Services                       Activities of Daily Living Home Assistive Devices/Equipment: None ADL Screening (condition at time of admission) Patient's cognitive ability adequate to safely complete daily activities?: Yes Is the patient deaf or have difficulty hearing?: Yes Does the patient have difficulty seeing, even when wearing glasses/contacts?: No Does the patient have difficulty concentrating, remembering, or making decisions?: No Patient able to express need for assistance with ADLs?: No Does the patient have difficulty dressing or bathing?: No Independently performs ADLs?: Yes (appropriate for developmental age) Does the patient have difficulty walking or climbing stairs?: Yes Weakness of Legs: Both Weakness of Arms/Hands: None  Permission Sought/Granted                  Emotional Assessment              Admission diagnosis:  Unresponsive episode [R40.4] Sepsis ([place]) [A41.9] Acute sepsis [facility]) [A41.9] Patient Active Problem List   Diagnosis Date Noted   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   Streptococcal bacteremia 04/12/2022   Sepsis ([place])  04/10/2022   Prediabetes 03/06/2021   Bipolar affective disorder, current episode manic ([place]) 03/03/2021   Pressure injury of skin 59/56/3875   Acute metabolic encephalopathy 64/33/2951   Hyponatremia syndrome 02/18/2021   OSA (obstructive sleep apnea) 11/26/2019   Cardiac pacemaker 04/30/2019   History of sinoatrial node dysfunction 04/30/2019   Other male erectile dysfunction 11/12/2018   Near syncope 05/22/2017   Coronary artery disease involving native coronary artery of native heart 01/10/2017   Low serum testosterone level 06/16/2016   Obesity (BMI 30-39.9) 03/09/2016   Syncope 07/19/2015   SIRS (systemic inflammatory response syndrome) ([place]) 07/07/2015   Bipolar I disorder ([place]) 06/24/2015   Bilateral pneumonia 06/23/2015   Bipolar affective disorder, currently manic, mild ([place]) 06/18/2015   History of duodenal ulcer 06/18/2015   Vaccine counseling 06/18/2015   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[place]) 06/07/2015   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   Bradycardia 02/08/2015   Hyperlipidemia, mixed 10/14/2014   AF (amaurosis fugax) 09/16/2014   Benign essential hypertension 09/16/2014   Chest pain 09/16/2014   Skin cyst 08/06/2012   Personal history of lymphoma 08/06/2012   Cancer ([place])    PCP:  [name], MD Pharmacy:   [facility] [place], [place] -  [address] [place] [address] Phone: [phone] Fax: [phone]  [id number] [address], [place] AT [address] [place] [address] Phone: [phone] Fax: [phone]     Social Determinants of Health (SDOH) Social History: [place]: No Food Insecurity (04/11/2022)  Housing: Low Risk  (04/16/2022)  Transportation Needs: No Transportation Needs (04/16/2022)  Utilities: Not At Risk (04/16/2022)  Alcohol Screen: Low Risk  (03/03/2021)  Tobacco Use: Low Risk   (04/18/2022)   SDOH Interventions: Housing Interventions: Intervention Not Indicated   Readmission Risk Interventions     No data to display

## 2022-04-19 NOTE — Progress Notes (Addendum)
At bedside for PICC placement. Antecubital region on right arm appearing as an old PIV site noted with excoriation, warmth and diffuse raised areas around the periphery. Right upper arm clean, dry and intact. PICC placed without issue.

## 2022-04-19 NOTE — NC FL2 (Signed)
[place] LEVEL OF CARE FORM     IDENTIFICATION  Patient Name: Douglas Perkins. Birthdate: 23-Oct-1945 Sex: male Admission Date (Current Location): 04/09/2022  [facility] and [place] Number:  Herbalist and Address:  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   Streptococcal bacteremia 04/12/2022   Sepsis ([place]) 04/10/2022   Prediabetes 03/06/2021   Bipolar affective disorder, current episode manic ([place]) 03/03/2021   Pressure injury of skin 10/29/1599   Acute metabolic encephalopathy 09/32/3557   Hyponatremia syndrome 02/18/2021   OSA (obstructive sleep apnea) 11/26/2019   Cardiac pacemaker 04/30/2019   History of sinoatrial node dysfunction 04/30/2019   Other male erectile dysfunction 11/12/2018   Near syncope 05/22/2017   Coronary artery disease involving native coronary artery of native heart 01/10/2017   Low serum testosterone level 06/16/2016   Obesity (BMI 30-39.9) 03/09/2016   Syncope 07/19/2015   SIRS (systemic inflammatory response syndrome) ([place]) 07/07/2015   Bipolar I disorder ([place]) 06/24/2015   Bilateral pneumonia 06/23/2015   Bipolar affective disorder, currently manic, mild ([place]) 06/18/2015   History of duodenal ulcer 06/18/2015   Vaccine counseling 06/18/2015   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[place]) 06/07/2015   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   Bradycardia 02/08/2015   Hyperlipidemia, mixed 10/14/2014   AF (amaurosis fugax) 09/16/2014    Benign essential hypertension 09/16/2014   Chest pain 09/16/2014   Skin cyst 08/06/2012   Personal history of lymphoma 08/06/2012   Cancer ([place])     Orientation RESPIRATION BLADDER Height & Weight     Self, Place, Situation, Time  Normal Continent Weight: 280 lb 13.9 oz (127.4 kg) Height:  6' (182.9 cm)  BEHAVIORAL SYMPTOMS/MOOD NEUROLOGICAL BOWEL NUTRITION STATUS      Continent Diet (see d/c summary)  AMBULATORY STATUS COMMUNICATION OF NEEDS Skin   Extensive Assist Verbally Surgical wounds                       Personal Care Assistance Level of Assistance  Bathing, Dressing, Feeding Bathing Assistance: Limited assistance Feeding assistance: Independent Dressing Assistance: Limited assistance     Functional Limitations Info  Sight, Speech, Hearing Sight Info: Impaired Hearing Info: Impaired Speech Info: Adequate    SPECIAL CARE FACTORS FREQUENCY  PT (By licensed PT), OT (By licensed OT)     PT Frequency: 5x/ week OT Frequency: 5x/ week            Contractures Contractures Info: Not present    Additional Factors Info  Allergies, Code Status, Psychotropic Code Status Info: Full Allergies Info: Sulfa Antibiotics  Feldene (Piroxicam Psychotropic Info: Depakote, seroquel         Current Medications (04/19/2022):  This is the current hospital active medication list Current Facility-Administered Medications  Medication Dose Route Frequency Provider Last Rate Last Admin   0.9 %  sodium chloride infusion (Manually program via Guardrails IV Fluids)   Intravenous Once Shirley Friar, PA-C       0.9 %  sodium chloride infusion (Manually program via Guardrails IV Fluids)   Intravenous Once Janene Harvey, CRNA       acetaminophen (TYLENOL) tablet 650 mg  650 mg Oral Q6H PRN Lelon Perla, MD   650 mg at 04/19/22 0830   Or   acetaminophen (TYLENOL) suppository 650 mg  650 mg Rectal Q6H PRN Lelon Perla, MD       albuterol (PROVENTIL) (2.5  MG/3ML) 0.083% nebulizer solution 2.5 mg  2.5 mg Inhalation Q6H PRN Lelon Perla, MD       amLODipine (NORVASC) tablet 10 mg  10 mg Oral Daily Elgergawy, Silver Huguenin, MD   10 mg at 04/19/22 0830   aspirin EC tablet 81 mg  81 mg Oral Daily Baldwin [place], PA-C   81 mg at 04/19/22 0830   atorvastatin (LIPITOR) tablet 40 mg  40 mg Oral Daily Lelon Perla, MD   40 mg at 04/19/22 0830   carvedilol (COREG CR) 24 hr capsule 80 mg  80 mg Oral Daily Elgergawy, Silver Huguenin, MD   80 mg at 04/19/22 0830   divalproex (DEPAKOTE) DR tablet 1,000 mg  1,000 mg Oral QHS Lelon Perla, MD   1,000 mg at 04/18/22 2149   dorzolamide (TRUSOPT) 2 % ophthalmic solution 1 drop  1 drop Right Eye BID Lelon Perla, MD   1 drop at 04/19/22 4098   gabapentin (NEURONTIN) capsule 300 mg  300 mg Oral BID Lelon Perla, MD   300 mg at 04/19/22 0830   hydrALAZINE (APRESOLINE) injection 10 mg  10 mg Intravenous Q4H PRN Elgergawy, Silver Huguenin, MD       latanoprost (XALATAN) 0.005 % ophthalmic solution 1 drop  1 drop Right Eye BID Lelon Perla, MD   1 drop at 04/19/22 1191   melatonin tablet 10 mg  10 mg Oral QHS Lelon Perla, MD   10 mg at 04/18/22 2146   mometasone-formoterol (DULERA) 100-5 MCG/ACT inhaler 2 puff  2 puff Inhalation BID Lelon Perla, MD   2 puff at 04/19/22 0849   ondansetron (ZOFRAN) tablet 4 mg  4 mg Oral Q6H PRN Lelon Perla, MD       Or   ondansetron [facility]) injection 4 mg  4 mg Intravenous Q6H PRN Lelon Perla, MD       pantoprazole (PROTONIX) EC tablet 40 mg  40 mg Oral BID Lelon Perla, MD   40 mg at 04/19/22 0830   penicillin G potassium 12 Million Units in dextrose 5 % 500 mL continuous infusion  12 Million Units Intravenous Q12H Lelon Perla, MD 41.7 mL/hr at 04/19/22 1153 12 Million Units at 04/19/22 1153   QUEtiapine (SEROQUEL) tablet 300 mg  300 mg Oral QHS Lelon Perla, MD   300 mg at 04/18/22 2145   tamsulosin (FLOMAX) capsule 0.4 mg  0.4 mg Oral  QHS Lelon Perla, MD   0.4 mg at 04/18/22 2146   timolol (TIMOPTIC) 0.5 % ophthalmic solution 1 drop  1 drop Right Eye BID Lelon Perla, MD   1 drop at 04/19/22 4782     Discharge Medications: Please see discharge summary for a list of discharge medications.  Relevant Imaging Results:  Relevant Lab Results:   Additional Information SSN: [id number]  ReAndra F Blade, LCSWA

## 2022-04-19 NOTE — Progress Notes (Signed)
Occupational Therapy Treatment Patient Details Name: Douglas Perkins. MRN: [id number] DOB: 11-08-1945 Today's Date: 04/19/2022   History of present illness Pt presents from [facility] 12/10 with unwitnessed fall. Found to have Severe sepsis,  Pacemaker lead infection  .  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   OT comments  Douglas Perkins is making incremental progress. Pt seen after PT session and reported fatigue. He was rolled to sink in chair to participate in ADLs while standing. Overall he required mod A to stand from the chair with both RW and with pulling on sink ledge. He benefited from cues for all problem solving and sequencing through simple tasks. He was heavily reliant on at least 1 UE supported in standing. Assisted pt with lunch order. OT to continue to follow acutely. POC updated to d/c to SNF for care of PICC.    Recommendations for follow up therapy are one component of a multi-disciplinary discharge planning process, led by the attending physician.  Recommendations may be updated based on patient status, additional functional criteria and insurance authorization.    Follow Up Recommendations  Skilled nursing-short term rehab (<3 hours/day) (for care of PICC)     Assistance Recommended at Discharge Frequent or constant Supervision/Assistance  Patient can return home with the following  A little help with walking and/or transfers;A little help with bathing/dressing/bathroom;Assistance with cooking/housework;Direct supervision/assist for medications management;Assist for transportation;Help with stairs or ramp for entrance   Equipment Recommendations  None recommended by OT       Precautions / Restrictions Precautions Precautions: Fall Restrictions Weight Bearing Restrictions: No       Mobility Bed Mobility Overal bed mobility: Needs Assistance             General bed mobility comments: OOB upon arrival     Transfers Overall transfer level: Needs assistance Equipment used: Rolling walker (2 wheels) Transfers: Sit to/from Stand Sit to Stand: Mod assist           General transfer comment: mod A to stand from the chair with use of sink ledge     Balance Overall balance assessment: Needs assistance Sitting-balance support: Feet supported, No upper extremity supported Sitting balance-Leahy Scale: Fair     Standing balance support: Single extremity supported, During functional activity Standing balance-Leahy Scale: Poor Standing balance comment: heavy reliance on UE support in standing at the sin k                           ADL either performed or assessed with clinical judgement   ADL Overall ADL's : Needs assistance/impaired     Grooming: Set up;Sitting;Min guard Grooming Details (indicate cue type and reason): min G while statically standing at the sink                 Toilet Transfer: Stand-pivot;Rolling walker (2 wheels);Moderate assistance Toilet Transfer Details (indicate cue type and reason): simulated         Functional mobility during ADLs: Rolling walker (2 wheels);Moderate assistance General ADL Comments: cues needed for all problem solving and sequencing.    Extremity/Trunk Assessment Upper Extremity Assessment Upper Extremity Assessment: Generalized weakness   Lower Extremity Assessment Lower Extremity Assessment: Defer to PT evaluation        Vision   Vision Assessment?: No apparent visual deficits   Perception Perception Perception: Not tested   Praxis Praxis Praxis: Not tested  Cognition Arousal/Alertness: Awake/alert Behavior During Therapy: WFL for tasks assessed/performed Overall Cognitive Status: No family/caregiver present to determine baseline cognitive functioning                                 General Comments: benefits from cues for sequencing and problem solving              General  Comments VSS on RA    Pertinent Vitals/ Pain       Pain Assessment Pain Assessment: Faces Faces Pain Scale: Hurts little more Pain Location: generalized with mobility/ADLs Pain Descriptors / Indicators: Discomfort, Sore, Grimacing, Guarding Pain Intervention(s): Limited activity within patient's tolerance, Monitored during session   Frequency  Min 2X/week        Progress Toward Goals  OT Goals(current goals can now be found in the care plan section)  Progress towards OT goals: Progressing toward goals  Acute Rehab OT Goals Patient Stated Goal: to get better OT Goal Formulation: With patient Time For Goal Achievement: 04/30/22 Potential to Achieve Goals: Good ADL Goals Pt Will Perform Grooming: with supervision;standing Pt Will Transfer to Toilet: with modified independence;ambulating;bedside commode Additional ADL Goal #1: Pt will tolerate 5 minutes of standing activity to optimize activity tolerance for return to ADL.  Plan Discharge plan needs to be updated       AM-PAC OT "6 Clicks" Daily Activity     Outcome Measure   Help from another person eating meals?: None Help from another person taking care of personal grooming?: A Little Help from another person toileting, which includes using toliet, bedpan, or urinal?: A Lot Help from another person bathing (including washing, rinsing, drying)?: A Little Help from another person to put on and taking off regular upper body clothing?: A Little Help from another person to put on and taking off regular lower body clothing?: A Lot 6 Click Score: 17    End of Session Equipment Utilized During Treatment: Rolling walker (2 wheels)  OT Visit Diagnosis: Unsteadiness on feet (R26.81);Muscle weakness (generalized) (M62.81);Other abnormalities of gait and mobility (R26.89);Pain Pain - Right/Left: Right   Activity Tolerance Patient tolerated treatment well   Patient Left in chair;with call bell/phone within reach;with chair  alarm set   Nurse Communication Mobility status        Time: 1856-3149 OT Time Calculation (min): 19 min  Charges: OT General Charges $OT Visit: 1 Visit OT Treatments $Self Care/Home Management : 8-22 mins    Elliot Cousin 04/19/2022, 3:00 PM

## 2022-04-19 NOTE — Progress Notes (Signed)
[facility] PROGRESS NOTE  Douglas Perkins. (DOB: [birth year]) QPR:[id number] PCP: Douglas Body, MD  Brief Narrative: Douglas Perkins. is a 76 y.o. 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 Found to be altered initially and sats were around 90% so placed on nonrebreather. In the ED, noted to be septic, started on broad-spectrum antibiotics. Blood cultures revealed GBS bacteremia, TEE revealing pacemaker lead infection. Pacemaker was extracted 12/18 with implantation of [place] pacemaker. 04/17/2022, with [place] pacemaker implant.  .   Subjective: No new complaints. No fever, feels fine.  Objective: BP (!) 137/50 (BP Location: Left Leg)   Pulse 86   Temp 98 F (36.7 C) (Oral)   Resp (!) 21   Ht 6' (1.829 m)   Wt 127.4 kg   SpO2 97%   BMI 38.09 kg/m   Gen: No distress Pulm: Clear, nonlabored  CV: RRR, no MRG, or JVD GI: Soft, NT, ND, +BS  Neuro: Alert and oriented. No new focal deficits. 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 Blood cultures cleared as of 12/11.  - Continue penicillin 87M units IV daily thru PICC placed 12/20 in RUE. End date is 05/29/2022 - Suture removal in 10-14 days.  - EP follow up scheduled 12/27 at 12:40pm for wound recheck, to see Dr. Quentin Ore 07/19/2022  - ID follow up scheduled 05/10/2022 2:30pm with Dr. Juleen China  Hypertension:  - Continue norvasc '10mg'$ , coreg CR '80mg'$  daily -Patient on 80 mg of Coreg CR at home, and amlodipine, blood pressure is elevated, so I will increase his Coreg CR to home dose.   CAD/HLD, demand ischemia: No RWMA on echo. No angina.  - Continue  aspirin, lipitor, coreg   Merkel cell carcinoma: S/p radiation therapy. CT showing area of inflammation in right groin. Negative LE venous doppler U/S.    Obstructive sleep apnea Does have implantable inspire device   Obesity: Perkins mass index is 38.09 kg/m.   Patrecia Pour, MD [facility] [email or web address] 04/19/2022, 5:33 PM

## 2022-04-19 NOTE — TOC Progression Note (Signed)
Transition of Care (TOC) - Initial/Assessment Note    Patient Details  Name: Douglas Perkins. MRN: [id number] Date of Birth: May 22, 1945  [facility]) CM/SW Contact:    Milinda Antis, LCSWA Phone Number: 04/19/2022, 1:15 PM  Clinical Narrative:                 CSW received consult for possible SNF placement at time of discharge.  CSW spoke with patient. Patient expressed understanding of PT recommendation and is agreeable to SNF placement at time of discharge. Patient reports preference for a facility near [employer] . CSW discussed insurance authorization process and provided Medicare SNF ratings list. CSW will send out referrals for review and provide bed offers as available.   Skilled Nursing Rehab Facilities-   [email or web address]   Ratings out of 5 stars (5 the highest)   Name Address  Phone # [place] Inspection Overall  [facility] [address], [place] '4 5 2 3  '$ [facility]  [address] [place], [place] [phone] '4 2 5 5  '$ [facility] [place], [place] '1 3 1 1  '$ [facility] [place], [place] '2 2 4 4  '$ [facility] [address] [place] '2 1 2 1  '$ [place] N. [address], [place] [phone] '3 3 4 4  '$ [facility] [address] [place] '4 1 3 2  '$ [facility] [address] [place] '4 1 3 2  '$ [address] ([place]) [place], [place] [phone] '3 1 2 1  '$ [facility] [phone], [place] [phone] '3 1 1 1  '$ [facility] [address], [facility] [phone] '3 2 2 2  '$ [facility] Maish Vaya. [place], [place] [phone] '3 1 1 1  '$ Dustin Flock [address] [place] [phone] '4 2 4 4          '$ [place] [address] [place] '4 1 3 2  '$ [facility] [place] [address], [place] '3 1 5 4  '$ [place], [place] [phone] '1 1 2 1  '$ [facility] [address] [employer] [phone] '2 2 4 4          '$ [address] (no [facility]) [place] [address], [place] [phone] '5 5 5 5  '$ [facility] (No Humana) [address], [place] '4 1 4 3  '$ [facility] (No UHC) [place], [place] '5 5 5 5  '$ [facility] [address], [employer] [phone] '2 3 5 5  '$ [place] [address], [place] '1 1 2 1  '$ [facility] [address] [place] [phone] '3 1 1 1  '$ [place], [place] '5 2 5 5  '$ [facility]  [address], [place] '2 2 1 1  '$ [facility] [address], [place] '3 2 1 1  '$ [facility] [place], [place] '2 2 2 2          '$ [facility] [address] [place] [phone] '1 1 1 1  '$ [facility] [address] [place]  [phone] '2 4 3 3  '$ [facility] [place] [address], [place] [phone] '3 2 3 3  '$ [place]  [facility], [place] [phone] '2 1 1 1  '$ [place] (No Humana) [address] [place] [phone] '2 2 3 3  '$ [facility]) [address], [place] [phone] '2 1 1 1          '$ [facility], [place] '5 4 5 5  '$ [facility])  [address], [place] '2 1 2 1  '$ [facility]) [place] [address] [place] '3 1 4 3  '$ [place] [address], [place] '3 3 4 4  '$ [address] [place], [place] '2 3 1 1  '$ [facility]) [place] [phone] '2 1 4 3  '$ CSW received consult for possible SNF  placement at time of discharge. CSW spoke with patient. Patient reported that patient's spouse is currently unable to care for patient at their home given patient's current physical needs and fall risk. Patient expressed understanding of PT recommendation and is agreeable to SNF placement at time of discharge. Patient reports preference for . CSW discussed insurance authorization process and will provide Medicare SNF ratings list. CSW will send out referrals for review and provide bed offers as available.   Skilled Nursing Rehab Facilities-   [email or web address]   Ratings out of 5 stars (5 the highest)   Name Address  Phone # [place] Inspection Overall  [facility] [address] [place] '4 5 2 3  '$ [facility]  [address] [place], [place] [phone] '4 2 5 5  '$ [facility] [place], [place] '1 3 1 1  '$ [facility], [place] '2 2 4 4  '$ [facility] [address], [place] '2 1 2 1  '$ [place]. [place] '3 3 4 4  '$ [facility] [address] [place] '4 1 3 2  '$ [facility] [address], [place] '4 1 3 2  '$ [address] ([place]) [place], [place] [phone] '3 1 2 1  '$ [facility] [address], [place] [phone] '3 1 1 1  '$ [facility] [address] [facility] [phone] '3 2 2 2  '$ [facility] ([place]) [place]. [place], [place] [phone] '3 1 1 1  '$ Dustin Flock [address] [place] [phone] '4 2 4 4          '$ [place] '4 1 3 2  '$ [facility] [place] [address], [place] '3 1 5 4  '$ [facility], [place] [address], [place] [phone] '1 1 2  1  '$ [facility] [address], [place] [phone] '2 2 4 4          '$ [address] (no [facility]) [place] [address], [place] [phone] '5 5 5 5  '$ [facility] (No Humana) [address] [phone] [address], [place] '4 1 4 3  '$ [facility] (No UHC) [place], [place] '5 5 5 5  '$ [facility] [address], [address] [phone] '2 3 5 5  '$ [place] [address] [place] '1 1 2 1  '$ [facility] [address], [address] [place] '3 1 1 1  '$ [facility] [place] '5 2 5 '$ 5  [facility]  [address], [place] '2 2 1 1  '$ [facility] [address], [place] '3 2 1 1  '$ [facility] [place], [place] '2 2 2 2          '$ [facility] [address], [place] [phone] '1 1 1 1  '$ [facility] [address], [place]  [phone] '2 4 3 3  '$ [facility] [place] [address], [place] [phone] '3 2 3 3  '$ [place] [address], [place] '2 1 1 1  '$ [place] (No Humana) [address] [place] [phone] '2 2 3 3  '$ [facility]) [address], [place] [phone] '2 1 1 1          '$ [facility] [place], [place] '5 4 5 5  '$ [facility])  [address], [place] '2 1 2 1  '$ [facility]) [place] [address] [place] '3 1 4 3  '$ [place] [address] [place] '3 3 4 4  '$ [address] [place], [place] '2 3 1 1  '$ [facility]) [place], [place] '2 1 4 3    '$ Planned Disposition: [place]     Patient Goals and CMS Choice            Expected Discharge Plan and Services Planned Disposition: [place]                                               Prior Living Arrangements/Services                       Activities of Daily Living Home Assistive Devices/Equipment: None ADL Screening (condition at time of admission) Patient's cognitive ability adequate to safely complete daily activities?: Yes Is the patient deaf or have difficulty hearing?: Yes Does the patient have difficulty seeing, even when wearing glasses/contacts?: No Does the patient have difficulty concentrating, remembering, or making decisions?: No Patient able to express need for assistance with ADLs?: No Does the patient have difficulty dressing or bathing?: No Independently performs ADLs?: Yes (appropriate for developmental age) Does the patient have difficulty walking or climbing stairs?: Yes Weakness of Legs: Both Weakness of Arms/Hands: None  Permission Sought/Granted                  Emotional Assessment              Admission diagnosis:  Unresponsive episode [R40.4] Sepsis ([place]) [A41.9] Acute sepsis [facility]) [A41.9] Patient Active Problem List   Diagnosis Date Noted   Pacemaker infection ([place]) 04/12/2022   Streptococcal bacteremia 04/12/2022   Sepsis ([place]) 04/10/2022   Prediabetes 03/06/2021   Bipolar affective disorder, current episode manic ([place]) 03/03/2021   Pressure injury of skin 40/97/3532   Acute metabolic encephalopathy 99/24/2683   Hyponatremia syndrome 02/18/2021   OSA (obstructive sleep apnea) 11/26/2019   Cardiac pacemaker 04/30/2019   History of sinoatrial node dysfunction 04/30/2019   Other male erectile dysfunction 11/12/2018   Near syncope 05/22/2017   Coronary artery disease involving native coronary artery of native heart 01/10/2017   Low serum testosterone level 06/16/2016   Obesity (BMI 30-39.9) 03/09/2016   Syncope 07/19/2015   SIRS (  systemic inflammatory response syndrome) ([place]) 07/07/2015   Bipolar I disorder ([place]) 06/24/2015   Bilateral pneumonia 06/23/2015   Bipolar affective disorder, currently manic,  mild ([place]) 06/18/2015   History of duodenal ulcer 06/18/2015   Vaccine counseling 06/18/2015   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[place]) 06/07/2015   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   Bradycardia 02/08/2015   Hyperlipidemia, mixed 10/14/2014   AF (amaurosis fugax) 09/16/2014   Benign essential hypertension 09/16/2014   Chest pain 09/16/2014   Skin cyst 08/06/2012   Personal history of lymphoma 08/06/2012   Cancer ([place])    PCP:  [name], MD Pharmacy:   [facility] [place], [place] - [address] [place] [address] Phone: [phone] Fax: [place] #[id number] Phillip Heal, [place] AT [facility] OF [address] [place] [address] Phone: [phone] Fax: [phone]     Social Determinants of Health (SDOH) Social History: [place]: No Food Insecurity (04/11/2022)  Housing: Low Risk  (04/16/2022)  Transportation Needs: No Transportation Needs (04/16/2022)  Utilities: Not At Risk (04/16/2022)  Alcohol Screen: Low Risk  (03/03/2021)  Tobacco Use: Low Risk  (04/18/2022)   SDOH Interventions: Housing Interventions: Intervention Not Indicated   Readmission Risk Interventions     No data to display

## 2022-04-20 LAB — CBC WITH DIFFERENTIAL/PLATELET
Abs Immature Granulocytes: 0.29 10*3/uL — ABNORMAL HIGH (ref 0.00–0.07)
Basophils Absolute: 0.1 10*3/uL (ref 0.0–0.1)
Basophils Relative: 1 %
Eosinophils Absolute: 0.2 10*3/uL (ref 0.0–0.5)
Eosinophils Relative: 1 %
HCT: 34.3 % — ABNORMAL LOW (ref 39.0–52.0)
Hemoglobin: 12 g/dL — ABNORMAL LOW (ref 13.0–17.0)
Immature Granulocytes: 2 %
Lymphocytes Relative: 21 %
Lymphs Abs: 2.8 10*3/uL (ref 0.7–4.0)
MCH: 32.1 pg (ref 26.0–34.0)
MCHC: 35 g/dL (ref 30.0–36.0)
MCV: 91.7 fL (ref 80.0–100.0)
Monocytes Absolute: 1.5 10*3/uL — ABNORMAL HIGH (ref 0.1–1.0)
Monocytes Relative: 12 %
Neutro Abs: 8.4 10*3/uL — ABNORMAL HIGH (ref 1.7–7.7)
Neutrophils Relative %: 63 %
Platelets: 398 10*3/uL (ref 150–400)
RBC: 3.74 MIL/uL — ABNORMAL LOW (ref 4.22–5.81)
RDW: 12.8 % (ref 11.5–15.5)
WBC: 13.2 10*3/uL — ABNORMAL HIGH (ref 4.0–10.5)
nRBC: 0 % (ref 0.0–0.2)

## 2022-04-20 LAB — TYPE AND SCREEN
ABO/RH(D): A POS
Antibody Screen: NEGATIVE
Unit division: 0
Unit division: 0
Unit division: 0
Unit division: 0

## 2022-04-20 LAB — BPAM RBC
Blood Product Expiration Date: 202401122359
Blood Product Expiration Date: 202401122359
Blood Product Expiration Date: 202401132359
Blood Product Expiration Date: 202401132359
ISSUE DATE / TIME: 202312181538
ISSUE DATE / TIME: 202312181538
ISSUE DATE / TIME: 202312181538
ISSUE DATE / TIME: 202312181538
Unit Type and Rh: 6200
Unit Type and Rh: 6200
Unit Type and Rh: 6200
Unit Type and Rh: 6200

## 2022-04-20 MED ORDER — PENICILLIN G POTASSIUM IV (FOR PTA / DISCHARGE USE ONLY): 24.0000 10*6.[IU] | INTRAVENOUS | 0 refills | Status: DC

## 2022-04-20 NOTE — Care Management Important Message (Signed)
Important Message  Patient Details  Name: Douglas Perkins. MRN: [id number] Date of Birth: [birth year]   Medicare Important Message Given:  Yes     Hannah Beat 04/20/2022, 1:04 PM

## 2022-04-20 NOTE — Progress Notes (Signed)
PASRR number has been issued and added to FL2. [place] is able to accept pt tomorrow.   Wandra Feinstein, MSW, LCSW [phone] (coverage)

## 2022-04-20 NOTE — Progress Notes (Signed)
Spoke with pt re current SNF offers, pt has accepted [facility]. Pt's PASRR is currently pending--30 day note, Fl2, and requested information faxed to [facility] for level 2 screen review. Hopeful for PASRR number to be issued later today or tomorrow. No auth needed for SNF. MD aware of barrier to dc.   Wandra Feinstein, MSW, LCSW [phone] (coverage)

## 2022-04-20 NOTE — Discharge Summary (Signed)
Physician Discharge Summary   Patient: Douglas Perkins. MRN: [id number] DOB: [birth year]  Admit date:     04/09/2022  Discharge date: 04/20/22  Discharge Physician: Patrecia Pour   PCP: [name], MD   Recommendations at discharge:   - Continue penicillin 99M units IV daily thru PICC placed 12/20 in RUE. End date is 05/29/2022 - Suture removal in 10-14 days.  - EP follow up scheduled 12/27 at 12:40pm for wound recheck, to see Dr. Quentin Ore 07/19/2022  - ID follow up scheduled 05/10/2022 2:30pm with Dr. 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 is a 76 y.o. 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 Found to be altered initially and sats were around 90% so placed on nonrebreather. In the ED, noted to be septic, started on broad-spectrum antibiotics. Blood cultures revealed GBS bacteremia, TEE revealing pacemaker lead infection. Pacemaker was extracted 12/18 with implantation of [place] pacemaker. 04/17/2022, with [place] pacemaker implant. PICC placed 12/20 for 6 weeks of IV antibiotics. This will require SNF.   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 Blood cultures cleared as of 12/11.  - Continue penicillin 99M units IV daily thru PICC placed 12/20 in RUE. End date is 05/29/2022 - Suture removal in 10-14 days.  - EP follow up scheduled 12/27 at 12:40pm for wound recheck, to see Dr. Quentin Ore 07/19/2022  - ID follow up scheduled 05/10/2022 2:30pm with Dr. Juleen China   Hypertension:  - Continue norvasc 37m, coreg CR  851mdaily   CAD/HLD, demand ischemia: No RWMA on echo. No angina.  - Continue aspirin, lipitor, coreg   Merkel cell carcinoma: S/p radiation therapy. CT showing area of inflammation in right groin. Negative LE venous doppler U/S.    Obstructive sleep apnea: No change   Obesity: Body mass index is 38.09 kg/m.  Bipolar disorder: This is quiescent.  - Continue home depakote.   Glaucoma:  - Continue gtt's  Consultants: Cardiology, EP Procedures performed:  04/14/22 TRANSESOPHAGEAL ECHOCARDIOGRAM (TEE) CrLelon PerlaMD  04/17/22 LEAD EXTRACTION LaVickie EpleyMD  Disposition: Skilled nursing facility Diet recommendation:  Cardiac diet DISCHARGE MEDICATION: Allergies as of 04/20/2022       Reactions   Sulfa Antibiotics Hives   Feldene [piroxicam] Hives        Medication List     TAKE these medications    albuterol 108 (90 Base) MCG/ACT inhaler Commonly known as: VENTOLIN HFA Inhale 1 puff into the lungs 3 (three) times daily. Or inhale 2 puffs every 8 hours as needed for SOB or wheezing.   amLODipine 10 MG tablet Commonly known as: NORVASC Take 1 tablet (10 mg total) by mouth daily.   aspirin EC 81 MG tablet Take 1 tablet (81 mg total) by mouth daily. Reported on 08/18/2015   atorvastatin 40 MG tablet Commonly known as: LIPITOR Take 1 tablet (40 mg total) by mouth daily.   bisacodyl 5 MG EC tablet Commonly known as: DULCOLAX Take 1 tablet (5 mg total) by mouth daily as needed for moderate constipation.  carvedilol 80 MG 24 hr capsule Commonly known as: COREG CR Take 80 mg by mouth daily.   diphenhydramine-acetaminophen 25-500 MG Tabs tablet Commonly known as: TYLENOL PM Take 1 tablet by mouth at bedtime.   divalproex 500 MG DR tablet Commonly known as: DEPAKOTE Take 2 tablets (1,000 mg total) by mouth at bedtime.   dorzolamide 2 % ophthalmic solution Commonly known as: TRUSOPT Place 1 drop into the right eye 2 (two) times daily.    fluticasone-salmeterol 45-21 MCG/ACT inhaler Commonly known as: ADVAIR HFA Inhale 2 puffs into the lungs 2 (two) times daily.   gabapentin 300 MG capsule Commonly known as: NEURONTIN Take 1 capsule (300 mg total) by mouth 2 (two) times daily.   latanoprost 0.005 % ophthalmic solution Commonly known as: XALATAN Place 1 drop into the right eye 2 (two) times daily.   Melatonin 10 MG Tabs Take 10 mg by mouth at bedtime.   MULTIVITAL PO Take 1 tablet by mouth daily.   pantoprazole 40 MG tablet Commonly known as: PROTONIX Take 1 tablet (40 mg total) by mouth 2 (two) times daily.   penicillin G  IVPB Inject 24 Million Units into the vein daily. Indication:  GBS ICD infection First Dose: Yes Last Day of Therapy:  05/29/22 Labs - Once weekly:  CBC/D and BMP, Labs - Every other week:  ESR and CRP Method of administration: Elastomeric (Continuous infusion) Pull PICC line at the completion of IV antibiotics Method of administration may be changed at the discretion of home infusion pharmacist based upon assessment of the patient and/or caregiver's ability to self-administer the medication ordered.   QUEtiapine 300 MG tablet Commonly known as: SEROQUEL Take 1 tablet (300 mg total) by mouth at bedtime.   tamsulosin 0.4 MG Caps capsule Commonly known as: FLOMAX Take 0.4 mg by mouth at bedtime.   timolol 0.5 % ophthalmic solution Commonly known as: TIMOPTIC Place 1 drop into the right eye 2 (two) times daily.               Discharge Care Instructions  (From admission, onward)           Start     Ordered   04/20/22 0000  Change dressing on IV access line weekly and PRN  (Home infusion instructions - [facility] )        04/20/22 1234            Contact information for follow-up providers     [facility] Follow up.   Specialty: Home Health Services Why: Home Health Registered Nurse-office to vist with the patient. Contact  information: [address] [place] [address] [phone]         [name], MD Follow up.   Specialty: Family Medicine Contact information: [place] [address] [phone]              Contact information for after-discharge care     Destination     [facility] .   Service: Skilled Nursing Contact information: [place] [phone]                    Discharge Exam: Danley Danker Weights   04/09/22 1727 04/11/22 1515  Weight: 97.5 kg 127.4 kg  No distress, pleasant, calm Left chest wall wound with well apposed wound edges, minimal erythema, no bleeding or discharge. Sutures in good position  Condition at  discharge: stable  The results of significant diagnostics from this hospitalization (including imaging, microbiology, ancillary and laboratory) are listed below for reference.   Imaging Studies: [place] EKG SITE RITE  Result Date: 04/18/2022 If Site Rite image not attached, placement could not be confirmed due to current cardiac rhythm.  [place] EKG SITE RITE  Result Date: 04/18/2022 If Site Rite image not attached, placement could not be confirmed due to current cardiac rhythm.  DG Chest 2 View  Result Date: 04/18/2022 CLINICAL DATA:  76 year old male with shortness of breath. "Pacemaker infection". EXAM: CHEST - 2 VIEW COMPARISON:  Portable chest 04/12/2022. FINDINGS: Seated upright AP and lateral views of the chest at 0623 hours. Respiratory motion on the lateral. Left chest dual lead pacemaker has been removed. There is a cylindrical hyperdense object now projecting over the anterior cardiac apex. Stable cardiac size and mediastinal contours. Lower lung volumes. Contralateral right chest generator device with electrical lead coursing up into the neck appears stable. No pneumothorax or pulmonary edema. No consolidation. Difficult to  exclude small pleural effusions. No acute osseous abnormality identified. Visible bowel gas within normal limits. IMPRESSION: Dual lead left chest pacemaker removed with small cylindrical hyperdensity now projecting over the anterior cardiac apex. But stable mediastinal contours. Lower lung volumes with mild atelectasis. Difficult to exclude small effusions. Electronically Signed   By: Genevie Ann M.D.   On: 04/18/2022 08:51   EP PPM/ICD IMPLANT  Result Date: 04/17/2022 CONCLUSIONS: 1. 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 Successful implantation of a [employer] Micra leadless pacemaker. 3. No early apparent complications. 4. Interrupted sutures to be removed in 10-14 days   ECHO TEE  Result Date: 04/14/2022    TRANSESOPHOGEAL ECHO REPORT   Patient Name:   Douglas Perkins. Date of Exam: 04/14/2022 Medical Rec #:  [id number]           Height:       72.0 in Accession #:    [id number]          Weight:       280.9 lb Date of Birth:  Oct 13, 1945           BSA:          2.461 m Patient Age:    38 years            BP:           135/64 mmHg Patient Gender: M                   HR:           110 bpm. Exam Location:  Inpatient Procedure: Transesophageal Echo and Color Doppler Indications:     Bacteremia  History:         Patient has prior history of Echocardiogram examinations, most                  recent 04/11/2022. Pacemaker and Abnormal ECG,                  Arrythmias:Bradycardia, Signs/Symptoms:Chest Pain and                  Bacteremia; Risk Factors:Hypertension.  Sonographer:     Roseanna Rainbow RDCS Referring Phys:  Kirk Ruths, S Diagnosing Phys: Kirk Ruths MD PROCEDURE: After discussion of the risks and benefits of a TEE, an informed consent was obtained from the patient. The transesophogeal probe was passed without difficulty through the esophogus  of the patient. Imaged were obtained with the patient in a left lateral decubitus position. Sedation  performed by different physician. The patient was monitored while under deep sedation. Anesthestetic sedation was provided intravenously by Anesthesiology: 435m of Propofol, 1088mof Lidocaine. The patient's vital signs; including heart rate, blood pressure, and oxygen saturation; remained stable throughout the procedure. The patient developed no complications during the procedure.  IMPRESSIONS  1. No valvular vegetations noted; oscillating density right atrial lead consistent with vegetation.  2. Left ventricular ejection fraction, by estimation, is 55 to 60%. The left ventricle has normal function. The left ventricle has no regional wall motion abnormalities.  3. Right ventricular systolic function is normal. The right ventricular size is normal.  4. No left atrial/left atrial appendage thrombus was detected.  5. The mitral valve is normal in structure. Trivial mitral valve regurgitation.  6. The aortic valve is tricuspid. Aortic valve regurgitation is not visualized. Aortic valve sclerosis is present, with no evidence of aortic valve stenosis.  7. There is Moderate (Grade III) plaque involving the descending aorta.  8. Agitated saline contrast bubble study was negative, with no evidence of any interatrial shunt. FINDINGS  Left Ventricle: Left ventricular ejection fraction, by estimation, is 55 to 60%. The left ventricle has normal function. The left ventricle has no regional wall motion abnormalities. The left ventricular internal cavity size was normal in size. Right Ventricle: The right ventricular size is normal. Right ventricular systolic function is normal. Left Atrium: Left atrial size was normal in size. No left atrial/left atrial appendage thrombus was detected. Right Atrium: Right atrial size was normal in size. Pericardium: There is no evidence of pericardial effusion. Mitral Valve: The mitral valve is normal in structure. Trivial mitral valve regurgitation. Tricuspid Valve: The tricuspid valve is  normal in structure. Tricuspid valve regurgitation is trivial. Aortic Valve: The aortic valve is tricuspid. Aortic valve regurgitation is not visualized. Aortic valve sclerosis is present, with no evidence of aortic valve stenosis. Pulmonic Valve: The pulmonic valve was normal in structure. Pulmonic valve regurgitation is not visualized. Aorta: The aortic root is normal in size and structure. There is moderate (Grade III) plaque involving the descending aorta. IAS/Shunts: The interatrial septum is aneurysmal. No atrial level shunt detected by color flow Doppler. Agitated saline contrast bubble study was negative, with no evidence of any interatrial shunt. Additional Comments: No valvular vegetations noted; oscillating density right atrial lead consistent with vegetation. A device lead is visualized. BrKirk RuthsD Electronically signed by BrKirk RuthsD Signature Date/Time: 04/14/2022/8:54:37 AM    Final    VAS [place] EXTREMITY VENOUS (DVT)  Result Date: 04/13/2022  Lower Venous DVT Study Patient Name:  Douglas Perkins Date of Exam:   04/13/2022 Medical Rec #: [id number]          Accession #:    [id number] of Birth: 12/1945-10-16          Patient Gender: M Patient Age:   [age] [facility]      VAS [place] EXTREMITY VENOUS (DVT) Referring Phys: NKIntegris DeaconessZENDUKA --------------------------------------------------------------------------------  Indications: Swelling, and Edema.  Comparison Study: no prior Performing Technologist: MeArchie PattenVS  Examination Guidelines: A complete evaluation includes B-mode imaging, spectral Doppler, color Doppler, and power Doppler as needed of all accessible portions of each vessel. Bilateral testing is considered an integral part of a complete examination. Limited examinations for reoccurring indications may be performed as  noted. The reflux portion of the exam is performed with the patient in reverse Trendelenburg.   +---------+---------------+---------+-----------+----------+--------------+ RIGHT    CompressibilityPhasicitySpontaneityPropertiesThrombus Aging +---------+---------------+---------+-----------+----------+--------------+ CFV      Full           Yes      Yes                                 +---------+---------------+---------+-----------+----------+--------------+ SFJ      Full                                                        +---------+---------------+---------+-----------+----------+--------------+ FV Prox  Full                                                        +---------+---------------+---------+-----------+----------+--------------+ FV Mid   Full                                                        +---------+---------------+---------+-----------+----------+--------------+ FV Distal               Yes      Yes                                 +---------+---------------+---------+-----------+----------+--------------+ PFV      Full                                                        +---------+---------------+---------+-----------+----------+--------------+ POP      Full           Yes      Yes                                 +---------+---------------+---------+-----------+----------+--------------+ PTV      Full                                                        +---------+---------------+---------+-----------+----------+--------------+ PERO     Full                                                        +---------+---------------+---------+-----------+----------+--------------+   +---------+---------------+---------+-----------+----------+--------------+ LEFT     CompressibilityPhasicitySpontaneityPropertiesThrombus Aging +---------+---------------+---------+-----------+----------+--------------+ CFV      Full           Yes  Yes                                  +---------+---------------+---------+-----------+----------+--------------+ SFJ      Full                                                        +---------+---------------+---------+-----------+----------+--------------+ FV Prox  Full                                                        +---------+---------------+---------+-----------+----------+--------------+ FV Mid   Full                                                        +---------+---------------+---------+-----------+----------+--------------+ FV DistalFull                                                        +---------+---------------+---------+-----------+----------+--------------+ PFV      Full                                                        +---------+---------------+---------+-----------+----------+--------------+ POP      Full           Yes      Yes                                 +---------+---------------+---------+-----------+----------+--------------+ PTV      Full                                                        +---------+---------------+---------+-----------+----------+--------------+ PERO     Full                                                        +---------+---------------+---------+-----------+----------+--------------+     Summary: BILATERAL: - No evidence of deep vein thrombosis seen in the lower extremities, bilaterally. -No evidence of popliteal cyst, bilaterally.   *See table(s) above for measurements and observations. Electronically signed by Monica Martinez MD on 04/13/2022 at 2:45:29 PM.    Final    DG Chest Port 1 View  Result Date: 04/12/2022 CLINICAL DATA:  Shortness of breath EXAM: PORTABLE CHEST 1 VIEW COMPARISON:  04/09/2022 FINDINGS: Left-sided implanted  cardiac device and right-sided nerve stimulator remain in place. Stable cardiomediastinal contours. No focal airspace consolidation, pleural effusion, or pneumothorax. IMPRESSION: Stabled  chest radiograph.  No acute cardiopulmonary findings. Electronically Signed   By: Davina Poke D.O.   On: 04/12/2022 08:13   ECHOCARDIOGRAM COMPLETE  Result Date: 04/11/2022    ECHOCARDIOGRAM REPORT   Patient Name:   Douglas Perkins. Date of Exam: 04/11/2022 Medical Rec #:  [id number]           Height:       72.0 in Accession #:    [id number]          Weight:       214.9 lb Date of Birth:  [birth year]           BSA:          2.197 m Patient Age:    29 years            BP:           128/63 mmHg Patient Gender: M                   HR:           76 bpm. Exam Location:  Inpatient Procedure: 2D Echo, Cardiac Doppler, Color Doppler and Intracardiac            Opacification Agent Indications:    Bacteremia  History:        Patient has prior history of Echocardiogram examinations, most                 recent 05/22/2017. CAD, Abnormal ECG and Pacemaker,                 Arrythmias:Bradycardia, Signs/Symptoms:Chest Pain, Bacteremia,                 Syncope and Dyspnea; Risk Factors:Hypertension, Sleep Apnea and                 Dyslipidemia. Cancer.  Sonographer:    Roseanna Rainbow RDCS Referring Phys: [id number] CORNELIUS N VAN DAM  Sonographer Comments: Technically difficult study due to poor echo windows, suboptimal parasternal window, suboptimal apical window, suboptimal subcostal window and patient is obese. Image acquisition challenging due to patient body habitus. Difficult study, patient moving and uncomfortable. IMPRESSIONS  1. The aortic valve is abnormal; trileaflet aortic valve with mild calcification. In the PLAX, cannot exclude echodensity on the ventricular surfance of the aortic valve. No AI in this region. There is mild calcification of the aortic valve. There is mild thickening of the aortic valve. Aortic valve regurgitation is not visualized.  2. Left ventricular ejection fraction, by estimation, is 60 to 65%. The left ventricle has normal function. The left ventricle has no regional wall motion abnormalities.  There is moderate concentric left ventricular hypertrophy. Left ventricular diastolic parameters are indeterminate.  3. Right ventricular systolic function is hyperdynamic. The right ventricular size is normal. Tricuspid regurgitation signal is inadequate for assessing PA pressure.  4. The mitral valve is normal in structure. No evidence of mitral valve regurgitation. No evidence of mitral stenosis.  5. The inferior vena cava is normal in size with greater than 50% respiratory variability, suggesting right atrial pressure of 3 mmHg. Comparison(s): No prior Echocardiogram. Conclusion(s)/Recommendation(s): In the setting of bacteremia, consider TEE. FINDINGS  Left Ventricle: Left ventricular ejection fraction, by estimation, is 60 to 65%. The left ventricle has normal function. The left ventricle has no regional wall motion abnormalities.  Definity contrast agent was given IV to delineate the left ventricular  endocardial borders. The left ventricular internal cavity size was normal in size. There is moderate concentric left ventricular hypertrophy. Left ventricular diastolic parameters are indeterminate. Right Ventricle: The right ventricular size is normal. No increase in right ventricular wall thickness. Right ventricular systolic function is hyperdynamic. Tricuspid regurgitation signal is inadequate for assessing PA pressure. Left Atrium: Left atrial size was normal in size. Right Atrium: Right atrial size was normal in size. Pericardium: There is no evidence of pericardial effusion. Mitral Valve: The mitral valve is normal in structure. No evidence of mitral valve regurgitation. No evidence of mitral valve stenosis. Tricuspid Valve: The tricuspid valve is normal in structure. Tricuspid valve regurgitation is not demonstrated. No evidence of tricuspid stenosis. Aortic Valve: The aortic valve is abnormal. There is mild calcification of the aortic valve. There is mild thickening of the aortic valve. Aortic valve  regurgitation is not visualized. Pulmonic Valve: The pulmonic valve was not well visualized. Pulmonic valve regurgitation is trivial. Aorta: The aortic root and ascending aorta are structurally normal, with no evidence of dilitation. Venous: The inferior vena cava is normal in size with greater than 50% respiratory variability, suggesting right atrial pressure of 3 mmHg. IAS/Shunts: No atrial level shunt detected by color flow Doppler.  LEFT VENTRICLE PLAX 2D LVIDd:         5.40 cm LVIDs:         3.70 cm LV PW:         1.30 cm LV IVS:        1.20 cm LVOT diam:     2.50 cm LV SV:         67 LV SV Index:   31 LVOT Area:     4.91 cm  LV Volumes (MOD) LV vol d, MOD A2C: 217.0 ml LV vol d, MOD A4C: 171.0 ml LV vol s, MOD A2C: 88.8 ml LV vol s, MOD A4C: 73.2 ml LV SV MOD A2C:     128.2 ml LV SV MOD A4C:     171.0 ml LV SV MOD BP:      114.3 ml RIGHT VENTRICLE             IVC RV S prime:     12.60 cm/s  IVC diam: 1.90 cm TAPSE (M-mode): 2.8 cm LEFT ATRIUM           Index        RIGHT ATRIUM           Index LA diam:      3.70 cm 1.68 cm/m   RA Area:     16.00 cm LA Vol (A2C): 37.8 ml 17.21 ml/m  RA Volume:   40.10 ml  18.25 ml/m LA Vol (A4C): 31.2 ml 14.20 ml/m  AORTIC VALVE LVOT Vmax:   100.53 cm/s LVOT Vmean:  66.033 cm/s LVOT VTI:    0.137 m  AORTA Ao Root diam: 3.60 cm Ao Asc diam:  3.20 cm MITRAL VALVE MV Area (PHT): 4.76 cm     SHUNTS MV Decel Time: 159 msec     Systemic VTI:  0.14 m MV E velocity: 119.33 cm/s  Systemic Diam: 2.50 cm Rudean Haskell MD Electronically signed by Rudean Haskell MD Signature Date/Time: 04/11/2022/10:48:29 AM    Final    CT Angio Chest PE W/Cm &/Or Wo Cm  Result Date: 04/09/2022 CLINICAL DATA:  Pulmonary embolism (PE) suspected, high prob; Sepsis. EXAM: CT ANGIOGRAPHY CHEST CT ABDOMEN  AND PELVIS WITH CONTRAST TECHNIQUE: Multidetector CT imaging of the chest was performed using the standard protocol during bolus administration of intravenous contrast. Multiplanar CT  image reconstructions and MIPs were obtained to evaluate the vascular anatomy. Multidetector CT imaging of the abdomen and pelvis was performed using the standard protocol during bolus administration of intravenous contrast. RADIATION DOSE REDUCTION: This exam was performed according to the departmental dose-optimization program which includes automated exposure control, adjustment of the mA and/or kV according to patient size and/or use of iterative reconstruction technique. CONTRAST:  35m OMNIPAQUE IOHEXOL 350 MG/ML SOLN COMPARISON:  CT chest 06/12/2016, CT abdomen pelvis 06/23/2015 FINDINGS: CTA CHEST FINDINGS Cardiovascular: There is adequate opacification of the pulmonary arterial tree. No intraluminal filling defect identified to suggest acute pulmonary embolism through the segmental level. The central pulmonary arteries are of normal caliber. Extensive multi-vessel coronary artery calcification. Mild calcification of the aortic valve leaflets. Global cardiac size within normal limits. No pericardial effusion. Mild atherosclerotic calcification within the thoracic aorta. No aortic aneurysm. 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 Mediastinum/Nodes: No enlarged mediastinal, hilar, or axillary lymph nodes. Thyroid gland, trachea, and esophagus demonstrate no significant findings. Lungs/Pleura: Mild bibasilar atelectasis. Lungs are otherwise clear. No pneumothorax or pleural effusion. Musculoskeletal: Neurostimulator battery pack is seen within the right anterior chest wall with its single lead extending into the right neck. Healed left rib fracture noted. No acute bone abnormality. Review of the MIP images confirms the above findings. CT ABDOMEN and PELVIS FINDINGS Hepatobiliary: Tiny cyst within the left hepatic lobe. Liver otherwise unremarkable. Gallbladder absent. No intra or extrahepatic biliary ductal dilation. Pancreas: Unremarkable Spleen:  Unremarkable Adrenals/Urinary Tract: The adrenal glands are unremarkable. The kidneys are normal in size and position. Left renal cortical scarring within the interpolar region. Mild bilateral nonspecific perinephric stranding. No loculated perinephric fluid collections. No intrarenal or ureteral calculi. No hydronephrosis. The bladder is unremarkable. Stomach/Bowel: Small epigastric ventral hernia contains a single loop of unremarkable mid transverse colon as well as small amount of mesenteric fat. The stomach, small bowel, and large bowel are otherwise unremarkable. Appendix normal. No free intraperitoneal gas or fluid. Vascular/Lymphatic: Aortic atherosclerosis. No aortic aneurysm. Extensive atherosclerotic calcification results in probable near occlusion of the right common femoral artery though this is not well assessed on this non arteriographic study. No pathologic abdominal or pelvic lymph nodes. Reproductive: Status post prostatectomy. Other: In addition to above-mentioned ventral hernia containing the transverse colon, a separate small bilobed left parasagittal ventral hernia is present containing mesenteric fat as well as a tiny umbilical hernia containing mesenteric fat. Small right fat containing inguinal hernia is present. Musculoskeletal: There is infiltrative changes within the right inguinal region as well as shotty right inguinal adenopathy which may be posttraumatic or inflammatory in nature. Healed left inferior and superior pubic rami fractures are noted. Degenerative changes are seen within the lumbar spine. No acute bone abnormality. Review of the MIP images confirms the above findings. IMPRESSION: 1. No pulmonary embolism. No acute intrathoracic pathology identified. 2. Extensive multi-vessel coronary artery calcification. 3. Mild calcification of the aortic valve leaflets. Echocardiography may be helpful to assess the degree of valvular dysfunction. 4. Small epigastric ventral hernia  containing a single loop of unremarkable mid transverse colon as well as small amount of mesenteric fat. Additional small bilobed left parasagittal ventral hernia containing mesenteric fat as well as a tiny umbilical hernia containing mesenteric fat. Small fat containing right inguinal hernia. 5.  Infiltrative changes within the right inguinal region as well as shotty right inguinal adenopathy which may be posttraumatic or inflammatory in nature. Correlation with clinical examination is recommended. 6. Extensive atherosclerotic calcification within the right common femoral artery resulting in probable near occlusion of the right common femoral artery. If indicated, this would be better assessed with CT arteriography. Aortic Atherosclerosis (ICD10-I70.0). Electronically Signed   By: Fidela Salisbury M.D.   On: 04/09/2022 21:46   CT Abdomen Pelvis W Contrast  Result Date: 04/09/2022 CLINICAL DATA:  Pulmonary embolism (PE) suspected, high prob; Sepsis. EXAM: CT ANGIOGRAPHY CHEST CT ABDOMEN AND PELVIS WITH CONTRAST TECHNIQUE: Multidetector CT imaging of the chest was performed using the standard protocol during bolus administration of intravenous contrast. Multiplanar CT image reconstructions and MIPs were obtained to evaluate the vascular anatomy. Multidetector CT imaging of the abdomen and pelvis was performed using the standard protocol during bolus administration of intravenous contrast. RADIATION DOSE REDUCTION: This exam was performed according to the departmental dose-optimization program which includes automated exposure control, adjustment of the mA and/or kV according to patient size and/or use of iterative reconstruction technique. CONTRAST:  42m OMNIPAQUE IOHEXOL 350 MG/ML SOLN COMPARISON:  CT chest 06/12/2016, CT abdomen pelvis 06/23/2015 FINDINGS: CTA CHEST FINDINGS Cardiovascular: There is adequate opacification of the pulmonary arterial tree. No intraluminal filling defect identified to suggest acute  pulmonary embolism through the segmental level. The central pulmonary arteries are of normal caliber. Extensive multi-vessel coronary artery calcification. Mild calcification of the aortic valve leaflets. Global cardiac size within normal limits. No pericardial effusion. Mild atherosclerotic calcification within the thoracic aorta. No aortic aneurysm. 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 Mediastinum/Nodes: No enlarged mediastinal, hilar, or axillary lymph nodes. Thyroid gland, trachea, and esophagus demonstrate no significant findings. Lungs/Pleura: Mild bibasilar atelectasis. Lungs are otherwise clear. No pneumothorax or pleural effusion. Musculoskeletal: Neurostimulator battery pack is seen within the right anterior chest wall with its single lead extending into the right neck. Healed left rib fracture noted. No acute bone abnormality. Review of the MIP images confirms the above findings. CT ABDOMEN and PELVIS FINDINGS Hepatobiliary: Tiny cyst within the left hepatic lobe. Liver otherwise unremarkable. Gallbladder absent. No intra or extrahepatic biliary ductal dilation. Pancreas: Unremarkable Spleen: Unremarkable Adrenals/Urinary Tract: The adrenal glands are unremarkable. The kidneys are normal in size and position. Left renal cortical scarring within the interpolar region. Mild bilateral nonspecific perinephric stranding. No loculated perinephric fluid collections. No intrarenal or ureteral calculi. No hydronephrosis. The bladder is unremarkable. Stomach/Bowel: Small epigastric ventral hernia contains a single loop of unremarkable mid transverse colon as well as small amount of mesenteric fat. The stomach, small bowel, and large bowel are otherwise unremarkable. Appendix normal. No free intraperitoneal gas or fluid. Vascular/Lymphatic: Aortic atherosclerosis. No aortic aneurysm. Extensive atherosclerotic calcification results in probable near  occlusion of the right common femoral artery though this is not well assessed on this non arteriographic study. No pathologic abdominal or pelvic lymph nodes. Reproductive: Status post prostatectomy. Other: In addition to above-mentioned ventral hernia containing the transverse colon, a separate small bilobed left parasagittal ventral hernia is present containing mesenteric fat as well as a tiny umbilical hernia containing mesenteric fat. Small right fat containing inguinal hernia is present. Musculoskeletal: There is infiltrative changes within the right inguinal region as well as shotty right inguinal adenopathy which may be posttraumatic or inflammatory in nature. Healed left inferior and superior pubic rami fractures are noted. Degenerative changes are  seen within the lumbar spine. No acute bone abnormality. Review of the MIP images confirms the above findings. IMPRESSION: 1. No pulmonary embolism. No acute intrathoracic pathology identified. 2. Extensive multi-vessel coronary artery calcification. 3. Mild calcification of the aortic valve leaflets. Echocardiography may be helpful to assess the degree of valvular dysfunction. 4. Small epigastric ventral hernia containing a single loop of unremarkable mid transverse colon as well as small amount of mesenteric fat. Additional small bilobed left parasagittal ventral hernia containing mesenteric fat as well as a tiny umbilical hernia containing mesenteric fat. Small fat containing right inguinal hernia. 5. Infiltrative changes within the right inguinal region as well as shotty right inguinal adenopathy which may be posttraumatic or inflammatory in nature. Correlation with clinical examination is recommended. 6. Extensive atherosclerotic calcification within the right common femoral artery resulting in probable near occlusion of the right common femoral artery. If indicated, this would be better assessed with CT arteriography. Aortic Atherosclerosis (ICD10-I70.0).  Electronically Signed   By: Fidela Salisbury M.D.   On: 04/09/2022 21:46   DG Chest Port 1 View  Result Date: 04/09/2022 CLINICAL DATA:  Short of breath, fell EXAM: PORTABLE CHEST 1 VIEW COMPARISON:  02/18/2021 FINDINGS: Single frontal view of the chest demonstrates stable dual lead cardiac pacer overlying left chest, and nerve stimulator overlying right chest. Cardiac silhouette is unremarkable. There is increased central vascular congestion without airspace disease, effusion, or pneumothorax. No acute bony abnormality. IMPRESSION: 1. Central vascular congestion.  No acute airspace disease. Electronically Signed   By: Randa Ngo M.D.   On: 04/09/2022 17:50    Microbiology: Results for orders placed or performed during the hospital encounter of 04/09/22  Resp panel by RT-PCR (RSV, Flu A&B, Covid) Anterior Nasal Swab     Status: None   Collection Time: 04/09/22  5:18 PM   Specimen: Anterior Nasal Swab  Result Value Ref Range Status   SARS Coronavirus 2 by RT PCR NEGATIVE NEGATIVE Final    Comment: (NOTE) SARS-CoV-2 target nucleic acids are NOT DETECTED.  The SARS-CoV-2 RNA is generally detectable in upper respiratory specimens during the acute phase of infection. The lowest concentration of SARS-CoV-2 viral copies this assay can detect is 138 copies/mL. A negative result does not preclude SARS-Cov-2 infection and should not be used as the sole basis for treatment or other patient management decisions. A negative result may occur with  improper specimen collection/handling, submission of specimen other than nasopharyngeal swab, presence of viral mutation(s) within the areas targeted by this assay, and inadequate number of viral copies(<138 copies/mL). A negative result must be combined with clinical observations, patient history, and epidemiological information. The expected result is Negative.  Fact Sheet for Patients:  EntrepreneurPulse.com.au  Fact Sheet for  Healthcare Providers:  IncredibleEmployment.be  This test is no t yet approved or cleared by the [place] FDA and  has been authorized for detection and/or diagnosis of SARS-CoV-2 by FDA under an Emergency Use Authorization (EUA). This EUA will remain  in effect (meaning this test can be used) for the duration of the COVID-19 declaration under Section 564(b)(1) of the Act, 21 U.S.C.section 360bbb-3(b)(1), unless the authorization is terminated  or revoked sooner.       Influenza A by PCR NEGATIVE NEGATIVE Final   Influenza B by PCR NEGATIVE NEGATIVE Final    Comment: (NOTE) The Xpert Xpress SARS-CoV-2/FLU/RSV plus assay is intended as an aid in the diagnosis of influenza from Nasopharyngeal swab specimens and should not be used as a sole  basis for treatment. Nasal washings and aspirates are unacceptable for Xpert Xpress SARS-CoV-2/FLU/RSV testing.  Fact Sheet for Patients: EntrepreneurPulse.com.au  Fact Sheet for Healthcare Providers: IncredibleEmployment.be  This test is not yet approved or cleared by the [place] FDA and has been authorized for detection and/or diagnosis of SARS-CoV-2 by FDA under an Emergency Use Authorization (EUA). This EUA will remain in effect (meaning this test can be used) for the duration of the COVID-19 declaration under Section 564(b)(1) of the Act, 21 U.S.C. section 360bbb-3(b)(1), unless the authorization is terminated or revoked.     Resp Syncytial Virus by PCR NEGATIVE NEGATIVE Final    Comment: (NOTE) Fact Sheet for Patients: EntrepreneurPulse.com.au  Fact Sheet for Healthcare Providers: IncredibleEmployment.be  This test is not yet approved or cleared by the [place] FDA and has been authorized for detection and/or diagnosis of SARS-CoV-2 by FDA under an Emergency Use Authorization (EUA). This EUA will remain in effect (meaning this  test can be used) for the duration of the COVID-19 declaration under Section 564(b)(1) of the Act, 21 U.S.C. section 360bbb-3(b)(1), unless the authorization is terminated or revoked.  Performed at [facility], [place] [address], [place], [place] [address]   Culture, blood (routine x 2)     Status: Abnormal   Collection Time: 04/09/22  5:24 PM   Specimen: BLOOD RIGHT HAND  Result Value Ref Range Status   Specimen Description BLOOD RIGHT HAND  Final   Special Requests   Final    BOTTLES DRAWN AEROBIC AND ANAEROBIC Blood Culture adequate volume   Culture  Setup Time   Final    GRAM POSITIVE COCCI IN CHAINS IN BOTH AEROBIC AND ANAEROBIC BOTTLES CRITICAL RESULT CALLED TO, READ BACK BY AND VERIFIED WITH: Donovan B [id number] FCP Performed at [facility], [place] [address], [place], [place] [address]    Culture GROUP B STREP(S.AGALACTIAE)ISOLATED (A)  Final   Report Status 04/12/2022 FINAL  Final   Organism ID, Bacteria GROUP B STREP(S.AGALACTIAE)ISOLATED  Final      Susceptibility   Group b strep(s.agalactiae)isolated - MIC*    CLINDAMYCIN >=1 RESISTANT Resistant     AMPICILLIN <=0.25 SENSITIVE Sensitive     ERYTHROMYCIN >=8 RESISTANT Resistant     VANCOMYCIN 0.5 SENSITIVE Sensitive     CEFTRIAXONE <=0.12 SENSITIVE Sensitive     LEVOFLOXACIN 1 SENSITIVE Sensitive     PENICILLIN <=0.06 SENSITIVE Sensitive     * GROUP B STREP(S.AGALACTIAE)ISOLATED  Blood Culture ID Panel (Reflexed)     Status: Abnormal   Collection Time: 04/09/22  5:24 PM  Result Value Ref Range Status   Enterococcus faecalis NOT DETECTED NOT DETECTED Final   Enterococcus Faecium NOT DETECTED NOT DETECTED Final   Listeria monocytogenes NOT DETECTED NOT DETECTED Final   Staphylococcus species NOT DETECTED NOT DETECTED Final   Staphylococcus aureus (BCID) NOT DETECTED NOT DETECTED Final   Staphylococcus epidermidis NOT DETECTED NOT DETECTED Final   Staphylococcus lugdunensis NOT DETECTED NOT  DETECTED Final   Streptococcus species DETECTED (A) NOT DETECTED Final    Comment: CRITICAL RESULT CALLED TO, READ BACK BY AND VERIFIED WITH: PHARMD ELIZABETH M [id number] FCP    Streptococcus agalactiae DETECTED (A) NOT DETECTED Final    Comment: CRITICAL RESULT CALLED TO, READ BACK BY AND VERIFIED WITH: PHARMD ELIZABETH M [id number] FCP    Streptococcus pneumoniae NOT DETECTED NOT DETECTED Final   Streptococcus pyogenes NOT DETECTED NOT DETECTED Final   A.calcoaceticus-baumannii NOT DETECTED NOT DETECTED  Final   Bacteroides fragilis NOT DETECTED NOT DETECTED Final   Enterobacterales NOT DETECTED NOT DETECTED Final   Enterobacter cloacae complex NOT DETECTED NOT DETECTED Final   Escherichia coli NOT DETECTED NOT DETECTED Final   Klebsiella aerogenes NOT DETECTED NOT DETECTED Final   Klebsiella oxytoca NOT DETECTED NOT DETECTED Final   Klebsiella pneumoniae NOT DETECTED NOT DETECTED Final   Proteus species NOT DETECTED NOT DETECTED Final   Salmonella species NOT DETECTED NOT DETECTED Final   Serratia marcescens NOT DETECTED NOT DETECTED Final   Haemophilus influenzae NOT DETECTED NOT DETECTED Final   Neisseria meningitidis NOT DETECTED NOT DETECTED Final   Pseudomonas aeruginosa NOT DETECTED NOT DETECTED Final   Stenotrophomonas maltophilia NOT DETECTED NOT DETECTED Final   Candida albicans NOT DETECTED NOT DETECTED Final   Candida auris NOT DETECTED NOT DETECTED Final   Candida glabrata NOT DETECTED NOT DETECTED Final   Candida krusei NOT DETECTED NOT DETECTED Final   Candida parapsilosis NOT DETECTED NOT DETECTED Final   Candida tropicalis NOT DETECTED NOT DETECTED Final   Cryptococcus neoformans/gattii NOT DETECTED NOT DETECTED Final    Comment: Performed at [facility], [place] [address], [place], [place] [address]  Culture, blood (routine x 2)     Status: Abnormal   Collection Time: 04/09/22  5:37 PM   Specimen: BLOOD LEFT HAND  Result Value Ref Range Status    Specimen Description BLOOD LEFT HAND  Final   Special Requests   Final    BOTTLES DRAWN AEROBIC AND ANAEROBIC Blood Culture adequate volume   Culture  Setup Time   Final    GRAM POSITIVE COCCI IN CHAINS IN BOTH AEROBIC AND ANAEROBIC BOTTLES CRITICAL VALUE NOTED.  VALUE IS CONSISTENT WITH PREVIOUSLY REPORTED AND CALLED VALUE.    Culture (A)  Final    GROUP B STREP(S.AGALACTIAE)ISOLATED SUSCEPTIBILITIES PERFORMED ON PREVIOUS CULTURE WITHIN THE LAST 5 DAYS. Performed at [facility], [place] [address], [place], [place] [address]    Report Status 04/12/2022 FINAL  Final  MRSA Next Gen by PCR, Nasal     Status: None   Collection Time: 04/10/22  6:25 AM   Specimen: Nasal Mucosa; Nasal Swab  Result Value Ref Range Status   MRSA by PCR Next Gen NOT DETECTED NOT DETECTED Final    Comment: (NOTE) The GeneXpert MRSA Assay (FDA approved for NASAL specimens only), is one component of a comprehensive MRSA colonization surveillance program. It is not intended to diagnose MRSA infection nor to guide or monitor treatment for MRSA infections. Test performance is not FDA approved in patients less than 62 years old. Performed at [facility], [place] [address] [place], [place] [address]   Culture, blood (Routine X 2) w Reflex to ID Panel     Status: None   Collection Time: 04/10/22  1:33 PM   Specimen: BLOOD RIGHT HAND  Result Value Ref Range Status   Specimen Description BLOOD RIGHT HAND  Final   Special Requests   Final    BOTTLES DRAWN AEROBIC AND ANAEROBIC Blood Culture adequate volume   Culture   Final    NO GROWTH 5 DAYS Performed at [facility], [place] [address], [place], [place] [address]    Report Status 04/16/2022 FINAL  Final    Labs: CBC: Recent Labs  Lab 04/16/22 0204 04/16/22 2337 04/18/22 0203 04/19/22 0155 04/20/22 0540  WBC 11.6* 11.2* 12.4* 9.3 13.2*  NEUTROABS 7.2 7.1 10.7* 5.9 8.4*  HGB 11.6*  11.7* 11.8* 11.5* 12.0*  HCT 33.7* 33.1* 33.2* 33.0* 34.3*   MCV 91.3 89.9 89.2 91.2 91.7  PLT 295 329 373 358 103   Basic Metabolic Panel: Recent Labs  Lab 04/14/22 0307 04/15/22 0217 04/16/22 2337 04/18/22 0203  NA 131* 132* 130* 131*  K 4.0 4.1 3.9 4.3  CL 96* 97* 98 100  CO2 _0 GLUCOSE 130* 140* 143* 159*  BUN _1 CREATININE 0.66 0.60* 0.58* 0.63  CALCIUM 8.5* 8.7* 8.3* 8.2*   Liver Function Tests: No results for input(s): "AST", "ALT", "ALKPHOS", "BILITOT", "PROT", "ALBUMIN" in the last 168 hours. CBG: No results for input(s): "GLUCAP" in the last 168 hours.  Discharge time spent: greater than 30 minutes.  Signed: Patrecia Pour, MD [facility] 04/20/2022

## 2022-04-21 MED ORDER — POLYETHYLENE GLYCOL 3350 17 G PO PACK
17.0000 g | PACK | Freq: Once | ORAL | Status: AC | PRN
  Administered 2022-04-21: 17 g via ORAL
  Filled 2022-04-21: qty 1

## 2022-04-21 NOTE — Care Management Important Message (Signed)
Important Message  Patient Details  Name: Douglas Perkins. MRN: [id number] Date of Birth: [birth year]   Medicare Important Message Given:  Yes     Shelda Altes 04/21/2022, 11:02 AM

## 2022-04-21 NOTE — TOC Transition Note (Signed)
Transition of Care [facility]) - CM/SW Discharge Note   Patient Details  Name: Douglas Perkins. MRN: [id number] Date of Birth: 02/14/1946  Transition of Care [facility]) CM/SW Contact:  Amador Cunas, [place] Phone Number: 04/21/2022, 11:26 AM   Clinical Narrative: Pt for dc to [facility] today. Spoke to [place] [facility] who confirmed they are prepared to admit pt to room 1205P. Pt aware of dc and reports agreeable. RN provided with number for report and PTAR arranged for transport. SW signing off at dc.   Wandra Feinstein, MSW, LCSW [phone] (coverage)        Final next level of care: Skilled Nursing Facility Barriers to Discharge: No Barriers Identified   Patient Goals and CMS Choice CMS Medicare.gov Compare Post Acute Care list provided to:: Patient Choice offered to / list presented to : Patient  Discharge Placement                Patient chooses bed at: [facility] Patient to be transferred to facility by: [facility] Name of family member notified: Pt to update family Patient and family notified of of transfer: 04/21/22  Discharge Plan and Services Additional resources added to the After Visit Summary for                                       Social Determinants of Health (SDOH) Interventions SDOH Screenings   Food Insecurity: No Food Insecurity (04/11/2022)  Housing: Low Risk  (04/16/2022)  Transportation Needs: No Transportation Needs (04/16/2022)  Utilities: Not At Risk (04/16/2022)  Alcohol Screen: Low Risk  (03/03/2021)  Tobacco Use: Low Risk  (04/18/2022)     Readmission Risk Interventions     No data to display

## 2022-04-21 NOTE — Discharge Summary (Signed)
Physician Discharge Summary   Patient: Douglas Perkins. MRN: [id number] DOB: 1945-10-21  Admit date:     04/09/2022  Discharge date: 04/21/22  Discharge Physician: Patrecia Pour   PCP: [name], MD   Recommendations at discharge:   - Continue penicillin 9M units IV daily thru PICC placed 12/20 in RUE. End date is 05/29/2022. - Suture removal in 10-14 days.  - EP follow up scheduled 12/27 at 12:40pm for wound recheck, to see Dr. Quentin Ore 07/19/2022  - ID follow up scheduled 05/10/2022 2:30pm with Dr. 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 is a 76 y.o. 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 Found to be altered initially and sats were around 90% so placed on nonrebreather. In the ED, noted to be septic, started on broad-spectrum antibiotics. Blood cultures revealed GBS bacteremia, TEE revealing pacemaker lead infection. Pacemaker was extracted 12/18 with implantation of [place] pacemaker. 04/17/2022, with [place] pacemaker implant. PICC placed 12/20 for 6 weeks of IV antibiotics. This will require SNF.   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 Blood cultures cleared as of 12/11.  - Continue penicillin 9M units IV daily thru PICC placed 12/20 in RUE. End date is 05/29/2022 - Suture removal in 10-14 days.  - EP follow up scheduled 12/27 at 12:40pm for wound recheck, to see Dr. Quentin Ore 07/19/2022  - ID follow up scheduled 05/10/2022 2:30pm with Dr. Juleen China - WBC declining overall, would suggest rechecking  CBC to confirm continued resolution.   Hypertension:  - Continue norvasc 61m, coreg CR 813mdaily   CAD/HLD, demand ischemia: No RWMA on echo. No angina.  - Continue aspirin, lipitor, coreg   Merkel cell carcinoma: S/p radiation therapy. CT showing area of inflammation in right groin. Negative LE venous doppler U/S.    Obstructive sleep apnea: No change   Obesity: Body mass index is 38.09 kg/m.  Bipolar disorder: This is quiescent.  - Continue home depakote.   Glaucoma:  - Continue gtt's  Consultants: Cardiology, EP Procedures performed:  04/14/22 TRANSESOPHAGEAL ECHOCARDIOGRAM (TEE) CrLelon PerlaMD  04/17/22 LEAD EXTRACTION LaVickie EpleyMD  Disposition: Skilled nursing facility Diet recommendation:  Cardiac diet DISCHARGE MEDICATION: Allergies as of 04/21/2022       Reactions   Sulfa Antibiotics Hives   Feldene [piroxicam] Hives        Medication List     TAKE these medications    albuterol 108 (90 Base) MCG/ACT inhaler Commonly known as: VENTOLIN HFA Inhale 1 puff into the lungs 3 (three) times daily. Or inhale 2 puffs every 8 hours as needed for SOB or wheezing.   amLODipine 10 MG tablet Commonly known as: NORVASC Take 1 tablet (10 mg total) by mouth daily.   aspirin EC 81 MG tablet Take 1 tablet (81 mg total) by mouth daily. Reported on 08/18/2015   atorvastatin 40 MG tablet Commonly known as: LIPITOR Take 1 tablet (40 mg total) by mouth daily.   bisacodyl 5 MG EC tablet Commonly known as: DULCOLAX Take 1 tablet (5  mg total) by mouth daily as needed for moderate constipation.   carvedilol 80 MG 24 hr capsule Commonly known as: COREG CR Take 80 mg by mouth daily.   diphenhydramine-acetaminophen 25-500 MG Tabs tablet Commonly known as: TYLENOL PM Take 1 tablet by mouth at bedtime.   divalproex 500 MG DR tablet Commonly known as: DEPAKOTE Take 2 tablets (1,000 mg total) by mouth at bedtime.   dorzolamide 2 % ophthalmic  solution Commonly known as: TRUSOPT Place 1 drop into the right eye 2 (two) times daily.   fluticasone-salmeterol 45-21 MCG/ACT inhaler Commonly known as: ADVAIR HFA Inhale 2 puffs into the lungs 2 (two) times daily.   gabapentin 300 MG capsule Commonly known as: NEURONTIN Take 1 capsule (300 mg total) by mouth 2 (two) times daily.   latanoprost 0.005 % ophthalmic solution Commonly known as: XALATAN Place 1 drop into the right eye 2 (two) times daily.   Melatonin 10 MG Tabs Take 10 mg by mouth at bedtime.   MULTIVITAL PO Take 1 tablet by mouth daily.   pantoprazole 40 MG tablet Commonly known as: PROTONIX Take 1 tablet (40 mg total) by mouth 2 (two) times daily.   penicillin G  IVPB Inject 24 Million Units into the vein daily. Indication:  GBS ICD infection First Dose: Yes Last Day of Therapy:  05/29/22 Labs - Once weekly:  CBC/D and BMP, Labs - Every other week:  ESR and CRP Method of administration: Elastomeric (Continuous infusion) Pull PICC line at the completion of IV antibiotics Method of administration may be changed at the discretion of home infusion pharmacist based upon assessment of the patient and/or caregiver's ability to self-administer the medication ordered.   QUEtiapine 300 MG tablet Commonly known as: SEROQUEL Take 1 tablet (300 mg total) by mouth at bedtime.   tamsulosin 0.4 MG Caps capsule Commonly known as: FLOMAX Take 0.4 mg by mouth at bedtime.   timolol 0.5 % ophthalmic solution Commonly known as: TIMOPTIC Place 1 drop into the right eye 2 (two) times daily.               Discharge Care Instructions  (From admission, onward)           Start     Ordered   04/20/22 0000  Change dressing on IV access line weekly and PRN  (Home infusion instructions - [facility] )        04/20/22 1234            Contact information for follow-up providers     [facility] Follow up.   Specialty: Home Health  Services Why: Home Health Registered Nurse-office to vist with the patient. Contact information: [address] [address] [phone]         [name], MD Follow up.   Specialty: Family Medicine Contact information: [address] [place] [address] [phone]              Contact information for after-discharge care     Destination     [facility] .   Service: Skilled Nursing Contact information: [place] [phone]                    Discharge Exam: Danley Danker Weights   04/09/22 1727 04/11/22 1515  Weight: 97.5 kg 127.4 kg  No distress, pleasant and calm this morning. Left chest wall wound with well apposed  wound edges, minimal erythema, no bleeding or discharge. Sutures in good position  Condition at discharge: stable  The results of significant diagnostics from this hospitalization (including imaging, microbiology, ancillary and laboratory) are listed below for reference.   Imaging Studies: [place] EKG SITE RITE  Result Date: 04/18/2022 If Site Rite image not attached, placement could not be confirmed due to current cardiac rhythm.  [place] EKG SITE RITE  Result Date: 04/18/2022 If Site Rite image not attached, placement could not be confirmed due to current cardiac rhythm.  DG Chest 2 View  Result Date: 04/18/2022 CLINICAL DATA:  76 year old male with shortness of breath. "Pacemaker infection". EXAM: CHEST - 2 VIEW COMPARISON:  Portable chest 04/12/2022. FINDINGS: Seated upright AP and lateral views of the chest at 0623 hours. Respiratory motion on the lateral. Left chest dual lead pacemaker has been removed. There is a cylindrical hyperdense object now projecting over the anterior cardiac apex. Stable cardiac size and mediastinal contours. Lower lung volumes. Contralateral right chest generator device with electrical lead coursing  up into the neck appears stable. No pneumothorax or pulmonary edema. No consolidation. Difficult to exclude small pleural effusions. No acute osseous abnormality identified. Visible bowel gas within normal limits. IMPRESSION: Dual lead left chest pacemaker removed with small cylindrical hyperdensity now projecting over the anterior cardiac apex. But stable mediastinal contours. Lower lung volumes with mild atelectasis. Difficult to exclude small effusions. Electronically Signed   By: Genevie Ann M.D.   On: 04/18/2022 08:51   EP PPM/ICD IMPLANT  Result Date: 04/17/2022 CONCLUSIONS: 1. 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 Successful implantation of a Medtronic Micra leadless pacemaker. 3. No early apparent complications. 4. Interrupted sutures to be removed in 10-14 days   ECHO TEE  Result Date: 04/14/2022    TRANSESOPHOGEAL ECHO REPORT   Patient Name:   Tarren Velardi. Date of Exam: 04/14/2022 Medical Rec #:  [id number]           Height:       72.0 in Accession #:    [id number]          Weight:       280.9 lb Date of Birth:  1946/03/28           BSA:          2.461 m Patient Age:    76 years            BP:           135/64 mmHg Patient Gender: M                   HR:           110 bpm. Exam Location:  Inpatient Procedure: Transesophageal Echo and Color Doppler Indications:     Bacteremia  History:         Patient has prior history of Echocardiogram examinations, most                  recent 04/11/2022. Pacemaker and Abnormal ECG,                  Arrythmias:Bradycardia, Signs/Symptoms:Chest Pain and                  Bacteremia; Risk Factors:Hypertension.  Sonographer:     Roseanna Rainbow RDCS Referring Phys:  Kirk Ruths, S Diagnosing Phys: Kirk Ruths MD PROCEDURE: After discussion of the risks and benefits of a TEE, an informed consent  was obtained from the patient. The transesophogeal probe was passed without difficulty through the esophogus of the  patient. Imaged were obtained with the patient in a left lateral decubitus position. Sedation performed by different physician. The patient was monitored while under deep sedation. Anesthestetic sedation was provided intravenously by Anesthesiology: 457m of Propofol, 1081mof Lidocaine. The patient's vital signs; including heart rate, blood pressure, and oxygen saturation; remained stable throughout the procedure. The patient developed no complications during the procedure.  IMPRESSIONS  1. No valvular vegetations noted; oscillating density right atrial lead consistent with vegetation.  2. Left ventricular ejection fraction, by estimation, is 55 to 60%. The left ventricle has normal function. The left ventricle has no regional wall motion abnormalities.  3. Right ventricular systolic function is normal. The right ventricular size is normal.  4. No left atrial/left atrial appendage thrombus was detected.  5. The mitral valve is normal in structure. Trivial mitral valve regurgitation.  6. The aortic valve is tricuspid. Aortic valve regurgitation is not visualized. Aortic valve sclerosis is present, with no evidence of aortic valve stenosis.  7. There is Moderate (Grade III) plaque involving the descending aorta.  8. Agitated saline contrast bubble study was negative, with no evidence of any interatrial shunt. FINDINGS  Left Ventricle: Left ventricular ejection fraction, by estimation, is 55 to 60%. The left ventricle has normal function. The left ventricle has no regional wall motion abnormalities. The left ventricular internal cavity size was normal in size. Right Ventricle: The right ventricular size is normal. Right ventricular systolic function is normal. Left Atrium: Left atrial size was normal in size. No left atrial/left atrial appendage thrombus was detected. Right Atrium: Right atrial size was normal in size. Pericardium: There is no evidence of pericardial effusion. Mitral Valve: The mitral valve is normal  in structure. Trivial mitral valve regurgitation. Tricuspid Valve: The tricuspid valve is normal in structure. Tricuspid valve regurgitation is trivial. Aortic Valve: The aortic valve is tricuspid. Aortic valve regurgitation is not visualized. Aortic valve sclerosis is present, with no evidence of aortic valve stenosis. Pulmonic Valve: The pulmonic valve was normal in structure. Pulmonic valve regurgitation is not visualized. Aorta: The aortic root is normal in size and structure. There is moderate (Grade III) plaque involving the descending aorta. IAS/Shunts: The interatrial septum is aneurysmal. No atrial level shunt detected by color flow Doppler. Agitated saline contrast bubble study was negative, with no evidence of any interatrial shunt. Additional Comments: No valvular vegetations noted; oscillating density right atrial lead consistent with vegetation. A device lead is visualized. BrKirk RuthsD Electronically signed by BrKirk RuthsD Signature Date/Time: 04/14/2022/8:54:37 AM    Final    VAS [place] EXTREMITY VENOUS (DVT)  Result Date: 04/13/2022  Lower Venous DVT Study Patient Name:  RaThor Nannini Date of Exam:   04/13/2022 Medical Rec #: [id number]          Accession #:    [id number] of Birth: 01/09/19/1947          Patient Gender: M Patient Age:   [age]ears [facility]      [facility] US[place]OWER EXTREMITY VENOUS (DVT) Referring Phys: [facility] HospitalZENDUKA --------------------------------------------------------------------------------  Indications: Swelling, and Edema.  Comparison Study: no prior Performing Technologist: MeArchie PattenVS  Examination Guidelines: A complete evaluation includes B-mode imaging, spectral Doppler, color Doppler, and power Doppler as needed of all accessible portions of each vessel. Bilateral testing is considered an  integral part of a complete examination. Limited examinations for reoccurring indications may be performed as noted.  The reflux portion of the exam is performed with the patient in reverse Trendelenburg.  +---------+---------------+---------+-----------+----------+--------------+ RIGHT    CompressibilityPhasicitySpontaneityPropertiesThrombus Aging +---------+---------------+---------+-----------+----------+--------------+ CFV      Full           Yes      Yes                                 +---------+---------------+---------+-----------+----------+--------------+ SFJ      Full                                                        +---------+---------------+---------+-----------+----------+--------------+ FV Prox  Full                                                        +---------+---------------+---------+-----------+----------+--------------+ FV Mid   Full                                                        +---------+---------------+---------+-----------+----------+--------------+ FV Distal               Yes      Yes                                 +---------+---------------+---------+-----------+----------+--------------+ PFV      Full                                                        +---------+---------------+---------+-----------+----------+--------------+ POP      Full           Yes      Yes                                 +---------+---------------+---------+-----------+----------+--------------+ PTV      Full                                                        +---------+---------------+---------+-----------+----------+--------------+ PERO     Full                                                        +---------+---------------+---------+-----------+----------+--------------+   +---------+---------------+---------+-----------+----------+--------------+ LEFT     CompressibilityPhasicitySpontaneityPropertiesThrombus Aging +---------+---------------+---------+-----------+----------+--------------+ CFV      Full  Yes       Yes                                 +---------+---------------+---------+-----------+----------+--------------+ SFJ      Full                                                        +---------+---------------+---------+-----------+----------+--------------+ FV Prox  Full                                                        +---------+---------------+---------+-----------+----------+--------------+ FV Mid   Full                                                        +---------+---------------+---------+-----------+----------+--------------+ FV DistalFull                                                        +---------+---------------+---------+-----------+----------+--------------+ PFV      Full                                                        +---------+---------------+---------+-----------+----------+--------------+ POP      Full           Yes      Yes                                 +---------+---------------+---------+-----------+----------+--------------+ PTV      Full                                                        +---------+---------------+---------+-----------+----------+--------------+ PERO     Full                                                        +---------+---------------+---------+-----------+----------+--------------+     Summary: BILATERAL: - No evidence of deep vein thrombosis seen in the lower extremities, bilaterally. -No evidence of popliteal cyst, bilaterally.   *See table(s) above for measurements and observations. Electronically signed by Monica Martinez MD on 04/13/2022 at 2:45:29 PM.    Final    DG Chest Port 1 View  Result Date: 04/12/2022 CLINICAL DATA:  Shortness of breath EXAM: PORTABLE CHEST 1 VIEW  COMPARISON:  04/09/2022 FINDINGS: Left-sided implanted cardiac device and right-sided nerve stimulator remain in place. Stable cardiomediastinal contours. No focal airspace consolidation, pleural effusion,  or pneumothorax. IMPRESSION: Stabled chest radiograph.  No acute cardiopulmonary findings. Electronically Signed   By: Davina Poke D.O.   On: 04/12/2022 08:13   ECHOCARDIOGRAM COMPLETE  Result Date: 04/11/2022    ECHOCARDIOGRAM REPORT   Patient Name:   Rayjon Wery. Date of Exam: 04/11/2022 Medical Rec #:  [id number]           Height:       72.0 in Accession #:    [id number]          Weight:       214.9 lb Date of Birth:  1945/06/16           BSA:          2.197 m Patient Age:    7 years            BP:           128/63 mmHg Patient Gender: M                   HR:           76 bpm. Exam Location:  Inpatient Procedure: 2D Echo, Cardiac Doppler, Color Doppler and Intracardiac            Opacification Agent Indications:    Bacteremia  History:        Patient has prior history of Echocardiogram examinations, most                 recent 05/22/2017. CAD, Abnormal ECG and Pacemaker,                 Arrythmias:Bradycardia, Signs/Symptoms:Chest Pain, Bacteremia,                 Syncope and Dyspnea; Risk Factors:Hypertension, Sleep Apnea and                 Dyslipidemia. Cancer.  Sonographer:    Roseanna Rainbow RDCS Referring Phys: [id number] CORNELIUS N VAN DAM  Sonographer Comments: Technically difficult study due to poor echo windows, suboptimal parasternal window, suboptimal apical window, suboptimal subcostal window and patient is obese. Image acquisition challenging due to patient body habitus. Difficult study, patient moving and uncomfortable. IMPRESSIONS  1. The aortic valve is abnormal; trileaflet aortic valve with mild calcification. In the PLAX, cannot exclude echodensity on the ventricular surfance of the aortic valve. No AI in this region. There is mild calcification of the aortic valve. There is mild thickening of the aortic valve. Aortic valve regurgitation is not visualized.  2. Left ventricular ejection fraction, by estimation, is 60 to 65%. The left ventricle has normal function. The left ventricle has no  regional wall motion abnormalities. There is moderate concentric left ventricular hypertrophy. Left ventricular diastolic parameters are indeterminate.  3. Right ventricular systolic function is hyperdynamic. The right ventricular size is normal. Tricuspid regurgitation signal is inadequate for assessing PA pressure.  4. The mitral valve is normal in structure. No evidence of mitral valve regurgitation. No evidence of mitral stenosis.  5. The inferior vena cava is normal in size with greater than 50% respiratory variability, suggesting right atrial pressure of 3 mmHg. Comparison(s): No prior Echocardiogram. Conclusion(s)/Recommendation(s): In the setting of bacteremia, consider TEE. FINDINGS  Left Ventricle: Left ventricular ejection fraction, by estimation, is 60 to 65%. The left ventricle has normal function. The left ventricle  has no regional wall motion abnormalities. Definity contrast agent was given IV to delineate the left ventricular  endocardial borders. The left ventricular internal cavity size was normal in size. There is moderate concentric left ventricular hypertrophy. Left ventricular diastolic parameters are indeterminate. Right Ventricle: The right ventricular size is normal. No increase in right ventricular wall thickness. Right ventricular systolic function is hyperdynamic. Tricuspid regurgitation signal is inadequate for assessing PA pressure. Left Atrium: Left atrial size was normal in size. Right Atrium: Right atrial size was normal in size. Pericardium: There is no evidence of pericardial effusion. Mitral Valve: The mitral valve is normal in structure. No evidence of mitral valve regurgitation. No evidence of mitral valve stenosis. Tricuspid Valve: The tricuspid valve is normal in structure. Tricuspid valve regurgitation is not demonstrated. No evidence of tricuspid stenosis. Aortic Valve: The aortic valve is abnormal. There is mild calcification of the aortic valve. There is mild thickening of  the aortic valve. Aortic valve regurgitation is not visualized. Pulmonic Valve: The pulmonic valve was not well visualized. Pulmonic valve regurgitation is trivial. Aorta: The aortic root and ascending aorta are structurally normal, with no evidence of dilitation. Venous: The inferior vena cava is normal in size with greater than 50% respiratory variability, suggesting right atrial pressure of 3 mmHg. IAS/Shunts: No atrial level shunt detected by color flow Doppler.  LEFT VENTRICLE PLAX 2D LVIDd:         5.40 cm LVIDs:         3.70 cm LV PW:         1.30 cm LV IVS:        1.20 cm LVOT diam:     2.50 cm LV SV:         67 LV SV Index:   31 LVOT Area:     4.91 cm  LV Volumes (MOD) LV vol d, MOD A2C: 217.0 ml LV vol d, MOD A4C: 171.0 ml LV vol s, MOD A2C: 88.8 ml LV vol s, MOD A4C: 73.2 ml LV SV MOD A2C:     128.2 ml LV SV MOD A4C:     171.0 ml LV SV MOD BP:      114.3 ml RIGHT VENTRICLE             IVC RV S prime:     12.60 cm/s  IVC diam: 1.90 cm TAPSE (M-mode): 2.8 cm LEFT ATRIUM           Index        RIGHT ATRIUM           Index LA diam:      3.70 cm 1.68 cm/m   RA Area:     16.00 cm LA Vol (A2C): 37.8 ml 17.21 ml/m  RA Volume:   40.10 ml  18.25 ml/m LA Vol (A4C): 31.2 ml 14.20 ml/m  AORTIC VALVE LVOT Vmax:   100.53 cm/s LVOT Vmean:  66.033 cm/s LVOT VTI:    0.137 m  AORTA Ao Root diam: 3.60 cm Ao Asc diam:  3.20 cm MITRAL VALVE MV Area (PHT): 4.76 cm     SHUNTS MV Decel Time: 159 msec     Systemic VTI:  0.14 m MV E velocity: 119.33 cm/s  Systemic Diam: 2.50 cm Rudean Haskell MD Electronically signed by Rudean Haskell MD Signature Date/Time: 04/11/2022/10:48:29 AM    Final    CT Angio Chest PE W/Cm &/Or Wo Cm  Result Date: 04/09/2022 CLINICAL DATA:  Pulmonary embolism (PE) suspected, high prob; Sepsis.  EXAM: CT ANGIOGRAPHY CHEST CT ABDOMEN AND PELVIS WITH CONTRAST TECHNIQUE: Multidetector CT imaging of the chest was performed using the standard protocol during bolus administration of  intravenous contrast. Multiplanar CT image reconstructions and MIPs were obtained to evaluate the vascular anatomy. Multidetector CT imaging of the abdomen and pelvis was performed using the standard protocol during bolus administration of intravenous contrast. RADIATION DOSE REDUCTION: This exam was performed according to the departmental dose-optimization program which includes automated exposure control, adjustment of the mA and/or kV according to patient size and/or use of iterative reconstruction technique. CONTRAST:  65m OMNIPAQUE IOHEXOL 350 MG/ML SOLN COMPARISON:  CT chest 06/12/2016, CT abdomen pelvis 06/23/2015 FINDINGS: CTA CHEST FINDINGS Cardiovascular: There is adequate opacification of the pulmonary arterial tree. No intraluminal filling defect identified to suggest acute pulmonary embolism through the segmental level. The central pulmonary arteries are of normal caliber. Extensive multi-vessel coronary artery calcification. Mild calcification of the aortic valve leaflets. Global cardiac size within normal limits. No pericardial effusion. Mild atherosclerotic calcification within the thoracic aorta. No aortic aneurysm. 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 Mediastinum/Nodes: No enlarged mediastinal, hilar, or axillary lymph nodes. Thyroid gland, trachea, and esophagus demonstrate no significant findings. Lungs/Pleura: Mild bibasilar atelectasis. Lungs are otherwise clear. No pneumothorax or pleural effusion. Musculoskeletal: Neurostimulator battery pack is seen within the right anterior chest wall with its single lead extending into the right neck. Healed left rib fracture noted. No acute bone abnormality. Review of the MIP images confirms the above findings. CT ABDOMEN and PELVIS FINDINGS Hepatobiliary: Tiny cyst within the left hepatic lobe. Liver otherwise unremarkable. Gallbladder absent. No intra or extrahepatic biliary ductal  dilation. Pancreas: Unremarkable Spleen: Unremarkable Adrenals/Urinary Tract: The adrenal glands are unremarkable. The kidneys are normal in size and position. Left renal cortical scarring within the interpolar region. Mild bilateral nonspecific perinephric stranding. No loculated perinephric fluid collections. No intrarenal or ureteral calculi. No hydronephrosis. The bladder is unremarkable. Stomach/Bowel: Small epigastric ventral hernia contains a single loop of unremarkable mid transverse colon as well as small amount of mesenteric fat. The stomach, small bowel, and large bowel are otherwise unremarkable. Appendix normal. No free intraperitoneal gas or fluid. Vascular/Lymphatic: Aortic atherosclerosis. No aortic aneurysm. Extensive atherosclerotic calcification results in probable near occlusion of the right common femoral artery though this is not well assessed on this non arteriographic study. No pathologic abdominal or pelvic lymph nodes. Reproductive: Status post prostatectomy. Other: In addition to above-mentioned ventral hernia containing the transverse colon, a separate small bilobed left parasagittal ventral hernia is present containing mesenteric fat as well as a tiny umbilical hernia containing mesenteric fat. Small right fat containing inguinal hernia is present. Musculoskeletal: There is infiltrative changes within the right inguinal region as well as shotty right inguinal adenopathy which may be posttraumatic or inflammatory in nature. Healed left inferior and superior pubic rami fractures are noted. Degenerative changes are seen within the lumbar spine. No acute bone abnormality. Review of the MIP images confirms the above findings. IMPRESSION: 1. No pulmonary embolism. No acute intrathoracic pathology identified. 2. Extensive multi-vessel coronary artery calcification. 3. Mild calcification of the aortic valve leaflets. Echocardiography may be helpful to assess the degree of valvular dysfunction.  4. Small epigastric ventral hernia containing a single loop of unremarkable mid transverse colon as well as small amount of mesenteric fat. Additional small bilobed left parasagittal ventral hernia containing mesenteric fat as well as a tiny umbilical hernia containing mesenteric fat. Small  fat containing right inguinal hernia. 5. Infiltrative changes within the right inguinal region as well as shotty right inguinal adenopathy which may be posttraumatic or inflammatory in nature. Correlation with clinical examination is recommended. 6. Extensive atherosclerotic calcification within the right common femoral artery resulting in probable near occlusion of the right common femoral artery. If indicated, this would be better assessed with CT arteriography. Aortic Atherosclerosis (ICD10-I70.0). Electronically Signed   By: Fidela Salisbury M.D.   On: 04/09/2022 21:46   CT Abdomen Pelvis W Contrast  Result Date: 04/09/2022 CLINICAL DATA:  Pulmonary embolism (PE) suspected, high prob; Sepsis. EXAM: CT ANGIOGRAPHY CHEST CT ABDOMEN AND PELVIS WITH CONTRAST TECHNIQUE: Multidetector CT imaging of the chest was performed using the standard protocol during bolus administration of intravenous contrast. Multiplanar CT image reconstructions and MIPs were obtained to evaluate the vascular anatomy. Multidetector CT imaging of the abdomen and pelvis was performed using the standard protocol during bolus administration of intravenous contrast. RADIATION DOSE REDUCTION: This exam was performed according to the departmental dose-optimization program which includes automated exposure control, adjustment of the mA and/or kV according to patient size and/or use of iterative reconstruction technique. CONTRAST:  32m OMNIPAQUE IOHEXOL 350 MG/ML SOLN COMPARISON:  CT chest 06/12/2016, CT abdomen pelvis 06/23/2015 FINDINGS: CTA CHEST FINDINGS Cardiovascular: There is adequate opacification of the pulmonary arterial tree. No intraluminal filling  defect identified to suggest acute pulmonary embolism through the segmental level. The central pulmonary arteries are of normal caliber. Extensive multi-vessel coronary artery calcification. Mild calcification of the aortic valve leaflets. Global cardiac size within normal limits. No pericardial effusion. Mild atherosclerotic calcification within the thoracic aorta. No aortic aneurysm. 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 Mediastinum/Nodes: No enlarged mediastinal, hilar, or axillary lymph nodes. Thyroid gland, trachea, and esophagus demonstrate no significant findings. Lungs/Pleura: Mild bibasilar atelectasis. Lungs are otherwise clear. No pneumothorax or pleural effusion. Musculoskeletal: Neurostimulator battery pack is seen within the right anterior chest wall with its single lead extending into the right neck. Healed left rib fracture noted. No acute bone abnormality. Review of the MIP images confirms the above findings. CT ABDOMEN and PELVIS FINDINGS Hepatobiliary: Tiny cyst within the left hepatic lobe. Liver otherwise unremarkable. Gallbladder absent. No intra or extrahepatic biliary ductal dilation. Pancreas: Unremarkable Spleen: Unremarkable Adrenals/Urinary Tract: The adrenal glands are unremarkable. The kidneys are normal in size and position. Left renal cortical scarring within the interpolar region. Mild bilateral nonspecific perinephric stranding. No loculated perinephric fluid collections. No intrarenal or ureteral calculi. No hydronephrosis. The bladder is unremarkable. Stomach/Bowel: Small epigastric ventral hernia contains a single loop of unremarkable mid transverse colon as well as small amount of mesenteric fat. The stomach, small bowel, and large bowel are otherwise unremarkable. Appendix normal. No free intraperitoneal gas or fluid. Vascular/Lymphatic: Aortic atherosclerosis. No aortic aneurysm. Extensive atherosclerotic  calcification results in probable near occlusion of the right common femoral artery though this is not well assessed on this non arteriographic study. No pathologic abdominal or pelvic lymph nodes. Reproductive: Status post prostatectomy. Other: In addition to above-mentioned ventral hernia containing the transverse colon, a separate small bilobed left parasagittal ventral hernia is present containing mesenteric fat as well as a tiny umbilical hernia containing mesenteric fat. Small right fat containing inguinal hernia is present. Musculoskeletal: There is infiltrative changes within the right inguinal region as well as shotty right inguinal adenopathy which may be posttraumatic or inflammatory in nature. Healed left inferior and superior pubic rami  fractures are noted. Degenerative changes are seen within the lumbar spine. No acute bone abnormality. Review of the MIP images confirms the above findings. IMPRESSION: 1. No pulmonary embolism. No acute intrathoracic pathology identified. 2. Extensive multi-vessel coronary artery calcification. 3. Mild calcification of the aortic valve leaflets. Echocardiography may be helpful to assess the degree of valvular dysfunction. 4. Small epigastric ventral hernia containing a single loop of unremarkable mid transverse colon as well as small amount of mesenteric fat. Additional small bilobed left parasagittal ventral hernia containing mesenteric fat as well as a tiny umbilical hernia containing mesenteric fat. Small fat containing right inguinal hernia. 5. Infiltrative changes within the right inguinal region as well as shotty right inguinal adenopathy which may be posttraumatic or inflammatory in nature. Correlation with clinical examination is recommended. 6. Extensive atherosclerotic calcification within the right common femoral artery resulting in probable near occlusion of the right common femoral artery. If indicated, this would be better assessed with CT arteriography.  Aortic Atherosclerosis (ICD10-I70.0). Electronically Signed   By: Fidela Salisbury M.D.   On: 04/09/2022 21:46   DG Chest Port 1 View  Result Date: 04/09/2022 CLINICAL DATA:  Short of breath, fell EXAM: PORTABLE CHEST 1 VIEW COMPARISON:  02/18/2021 FINDINGS: Single frontal view of the chest demonstrates stable dual lead cardiac pacer overlying left chest, and nerve stimulator overlying right chest. Cardiac silhouette is unremarkable. There is increased central vascular congestion without airspace disease, effusion, or pneumothorax. No acute bony abnormality. IMPRESSION: 1. Central vascular congestion.  No acute airspace disease. Electronically Signed   By: Randa Ngo M.D.   On: 04/09/2022 17:50    Microbiology: Results for orders placed or performed during the hospital encounter of 04/09/22  Resp panel by RT-PCR (RSV, Flu A&B, Covid) Anterior Nasal Swab     Status: None   Collection Time: 04/09/22  5:18 PM   Specimen: Anterior Nasal Swab  Result Value Ref Range Status   SARS Coronavirus 2 by RT PCR NEGATIVE NEGATIVE Final    Comment: (NOTE) SARS-CoV-2 target nucleic acids are NOT DETECTED.  The SARS-CoV-2 RNA is generally detectable in upper respiratory specimens during the acute phase of infection. The lowest concentration of SARS-CoV-2 viral copies this assay can detect is 138 copies/mL. A negative result does not preclude SARS-Cov-2 infection and should not be used as the sole basis for treatment or other patient management decisions. A negative result may occur with  improper specimen collection/handling, submission of specimen other than nasopharyngeal swab, presence of viral mutation(s) within the areas targeted by this assay, and inadequate number of viral copies(<138 copies/mL). A negative result must be combined with clinical observations, patient history, and epidemiological information. The expected result is Negative.  Fact Sheet for Patients:   EntrepreneurPulse.com.au  Fact Sheet for Healthcare Providers:  IncredibleEmployment.be  This test is no t yet approved or cleared by the [place] FDA and  has been authorized for detection and/or diagnosis of SARS-CoV-2 by FDA under an Emergency Use Authorization (EUA). This EUA will remain  in effect (meaning this test can be used) for the duration of the COVID-19 declaration under Section 564(b)(1) of the Act, 21 U.S.C.section 360bbb-3(b)(1), unless the authorization is terminated  or revoked sooner.       Influenza A by PCR NEGATIVE NEGATIVE Final   Influenza B by PCR NEGATIVE NEGATIVE Final    Comment: (NOTE) The Xpert Xpress SARS-CoV-2/FLU/RSV plus assay is intended as an aid in the diagnosis of influenza from Nasopharyngeal swab specimens and should  not be used as a sole basis for treatment. Nasal washings and aspirates are unacceptable for Xpert Xpress SARS-CoV-2/FLU/RSV testing.  Fact Sheet for Patients: EntrepreneurPulse.com.au  Fact Sheet for Healthcare Providers: IncredibleEmployment.be  This test is not yet approved or cleared by the [place] FDA and has been authorized for detection and/or diagnosis of SARS-CoV-2 by FDA under an Emergency Use Authorization (EUA). This EUA will remain in effect (meaning this test can be used) for the duration of the COVID-19 declaration under Section 564(b)(1) of the Act, 21 U.S.C. section 360bbb-3(b)(1), unless the authorization is terminated or revoked.     Resp Syncytial Virus by PCR NEGATIVE NEGATIVE Final    Comment: (NOTE) Fact Sheet for Patients: EntrepreneurPulse.com.au  Fact Sheet for Healthcare Providers: IncredibleEmployment.be  This test is not yet approved or cleared by the [place] FDA and has been authorized for detection and/or diagnosis of SARS-CoV-2 by FDA under an Emergency Use  Authorization (EUA). This EUA will remain in effect (meaning this test can be used) for the duration of the COVID-19 declaration under Section 564(b)(1) of the Act, 21 U.S.C. section 360bbb-3(b)(1), unless the authorization is terminated or revoked.  Performed at [facility], [place] [address] [place], [place] [address]   Culture, blood (routine x 2)     Status: Abnormal   Collection Time: 04/09/22  5:24 PM   Specimen: BLOOD RIGHT HAND  Result Value Ref Range Status   Specimen Description BLOOD RIGHT HAND  Final   Special Requests   Final    BOTTLES DRAWN AEROBIC AND ANAEROBIC Blood Culture adequate volume   Culture  Setup Time   Final    GRAM POSITIVE COCCI IN CHAINS IN BOTH AEROBIC AND ANAEROBIC BOTTLES CRITICAL RESULT CALLED TO, READ BACK BY AND VERIFIED WITH: [place] Y [id number] FCP Performed at [facility], [place] [address], [place],  [address]    Culture GROUP B STREP(S.AGALACTIAE)ISOLATED (A)  Final   Report Status 04/12/2022 FINAL  Final   Organism ID, Bacteria GROUP B STREP(S.AGALACTIAE)ISOLATED  Final      Susceptibility   Group b strep(s.agalactiae)isolated - MIC*    CLINDAMYCIN >=1 RESISTANT Resistant     AMPICILLIN <=0.25 SENSITIVE Sensitive     ERYTHROMYCIN >=8 RESISTANT Resistant     VANCOMYCIN 0.5 SENSITIVE Sensitive     CEFTRIAXONE <=0.12 SENSITIVE Sensitive     LEVOFLOXACIN 1 SENSITIVE Sensitive     PENICILLIN <=0.06 SENSITIVE Sensitive     * GROUP B STREP(S.AGALACTIAE)ISOLATED  Blood Culture ID Panel (Reflexed)     Status: Abnormal   Collection Time: 04/09/22  5:24 PM  Result Value Ref Range Status   Enterococcus faecalis NOT DETECTED NOT DETECTED Final   Enterococcus Faecium NOT DETECTED NOT DETECTED Final   Listeria monocytogenes NOT DETECTED NOT DETECTED Final   Staphylococcus species NOT DETECTED NOT DETECTED Final   Staphylococcus aureus (BCID) NOT DETECTED NOT DETECTED Final   Staphylococcus epidermidis NOT DETECTED NOT  DETECTED Final   Staphylococcus lugdunensis NOT DETECTED NOT DETECTED Final   Streptococcus species DETECTED (A) NOT DETECTED Final    Comment: CRITICAL RESULT CALLED TO, READ BACK BY AND VERIFIED WITH: PHARMD ELIZABETH M [id number] FCP    Streptococcus agalactiae DETECTED (A) NOT DETECTED Final    Comment: CRITICAL RESULT CALLED TO, READ BACK BY AND VERIFIED WITH: PHARMD ELIZABETH M [id number] FCP    Streptococcus pneumoniae NOT DETECTED NOT DETECTED Final   Streptococcus pyogenes NOT DETECTED NOT DETECTED Final  A.calcoaceticus-baumannii NOT DETECTED NOT DETECTED Final   Bacteroides fragilis NOT DETECTED NOT DETECTED Final   Enterobacterales NOT DETECTED NOT DETECTED Final   Enterobacter cloacae complex NOT DETECTED NOT DETECTED Final   Escherichia coli NOT DETECTED NOT DETECTED Final   Klebsiella aerogenes NOT DETECTED NOT DETECTED Final   Klebsiella oxytoca NOT DETECTED NOT DETECTED Final   Klebsiella pneumoniae NOT DETECTED NOT DETECTED Final   Proteus species NOT DETECTED NOT DETECTED Final   Salmonella species NOT DETECTED NOT DETECTED Final   Serratia marcescens NOT DETECTED NOT DETECTED Final   Haemophilus influenzae NOT DETECTED NOT DETECTED Final   Neisseria meningitidis NOT DETECTED NOT DETECTED Final   Pseudomonas aeruginosa NOT DETECTED NOT DETECTED Final   Stenotrophomonas maltophilia NOT DETECTED NOT DETECTED Final   Candida albicans NOT DETECTED NOT DETECTED Final   Candida auris NOT DETECTED NOT DETECTED Final   Candida glabrata NOT DETECTED NOT DETECTED Final   Candida krusei NOT DETECTED NOT DETECTED Final   Candida parapsilosis NOT DETECTED NOT DETECTED Final   Candida tropicalis NOT DETECTED NOT DETECTED Final   Cryptococcus neoformans/gattii NOT DETECTED NOT DETECTED Final    Comment: Performed at [facility], [place]. [address], [place], [place] [address]  Culture, blood (routine x 2)     Status: Abnormal   Collection Time: 04/09/22  5:37 PM    Specimen: BLOOD LEFT HAND  Result Value Ref Range Status   Specimen Description BLOOD LEFT HAND  Final   Special Requests   Final    BOTTLES DRAWN AEROBIC AND ANAEROBIC Blood Culture adequate volume   Culture  Setup Time   Final    GRAM POSITIVE COCCI IN CHAINS IN BOTH AEROBIC AND ANAEROBIC BOTTLES CRITICAL VALUE NOTED.  VALUE IS CONSISTENT WITH PREVIOUSLY REPORTED AND CALLED VALUE.    Culture (A)  Final    GROUP B STREP(S.AGALACTIAE)ISOLATED SUSCEPTIBILITIES PERFORMED ON PREVIOUS CULTURE WITHIN THE LAST 5 DAYS. Performed at [facility], [place] [address] [place], [place] [address]    Report Status 04/12/2022 FINAL  Final  MRSA Next Gen by PCR, Nasal     Status: None   Collection Time: 04/10/22  6:25 AM   Specimen: Nasal Mucosa; Nasal Swab  Result Value Ref Range Status   MRSA by PCR Next Gen NOT DETECTED NOT DETECTED Final    Comment: (NOTE) The GeneXpert MRSA Assay (FDA approved for NASAL specimens only), is one component of a comprehensive MRSA colonization surveillance program. It is not intended to diagnose MRSA infection nor to guide or monitor treatment for MRSA infections. Test performance is not FDA approved in patients less than 22 years old. Performed at [facility], [place] [address], [place], [place] [address]   Culture, blood (Routine X 2) w Reflex to ID Panel     Status: None   Collection Time: 04/10/22  1:33 PM   Specimen: BLOOD RIGHT HAND  Result Value Ref Range Status   Specimen Description BLOOD RIGHT HAND  Final   Special Requests   Final    BOTTLES DRAWN AEROBIC AND ANAEROBIC Blood Culture adequate volume   Culture   Final    NO GROWTH 5 DAYS Performed at [facility], [place] [address], [place], [place] [address]    Report Status 04/16/2022 FINAL  Final    Labs: CBC: Recent Labs  Lab 04/16/22 0204 04/16/22 2337 04/18/22 0203 04/19/22 0155 04/20/22 0540  WBC 11.6* 11.2* 12.4* 9.3 13.2*  NEUTROABS 7.2 7.1 10.7*  5.9 8.4*  HGB 11.6*  11.7* 11.8* 11.5* 12.0*  HCT 33.7* 33.1* 33.2* 33.0* 34.3*  MCV 91.3 89.9 89.2 91.2 91.7  PLT 295 329 373 358 507   Basic Metabolic Panel: Recent Labs  Lab 04/15/22 0217 04/16/22 2337 04/18/22 0203  NA 132* 130* 131*  K 4.1 3.9 4.3  CL 97* 98 100  CO2 _0 GLUCOSE 140* 143* 159*  BUN _1 CREATININE 0.60* 0.58* 0.63  CALCIUM 8.7* 8.3* 8.2*   Liver Function Tests: No results for input(s): "AST", "ALT", "ALKPHOS", "BILITOT", "PROT", "ALBUMIN" in the last 168 hours. CBG: No results for input(s): "GLUCAP" in the last 168 hours.  Discharge time spent: greater than 30 minutes.  Signed: Patrecia Pour, MD [facility] 04/21/2022

## 2022-04-21 NOTE — Progress Notes (Signed)
Pt safely discharged. Discharge packet in transport folder. Pt aware of plan of care. Report called in to [facility] and given to nurse Ashlesha. VS as per flow. IV removed, PICC line in place. Pt verbalized understanding. All questions and concerns addressed. Transport just arrived.

## 2022-04-26 ENCOUNTER — Ambulatory Visit: Payer: No Typology Code available for payment source | Attending: Interventional Cardiology

## 2022-04-26 DIAGNOSIS — I495 Sick sinus syndrome: Secondary | ICD-10-CM | POA: Insufficient documentation

## 2022-04-26 LAB — CUP PACEART INCLINIC DEVICE CHECK
Date Time Interrogation Session: 20231227132203
Implantable Pulse Generator Implant Date: 20231218

## 2022-04-26 NOTE — Progress Notes (Signed)
Wound check appointment. Sutures previously removed at Pt's facility. Wound without redness or edema. Incision edges approximated, wound well healed. Normal MICRA device function.  See media for complete interrogation.

## 2022-04-26 NOTE — Patient Instructions (Signed)
After Your Pacemaker   Monitor your pacemaker site for redness, swelling, and drainage. Call the device clinic at [phone] if you experience these symptoms or fever/chills.  You may use a hot tub or a pool after your wound check appointment if the incision is completely closed.   There are no restrictions in arm movement after your wound check appointment.  You may drive, unless driving has been restricted by your healthcare providers.  Remote monitoring is used to monitor your pacemaker from home. This monitoring is scheduled every 91 days by our office. It allows [place] to keep an eye on the functioning of your device to ensure it is working properly. You will routinely see your Electrophysiologist annually (more often if necessary).

## 2022-05-02 ENCOUNTER — Ambulatory Visit: Payer: TRICARE For Life (TFL) | Admitting: Physician Assistant

## 2022-05-10 ENCOUNTER — Encounter: Payer: Self-pay | Admitting: Internal Medicine

## 2022-05-10 ENCOUNTER — Other Ambulatory Visit: Payer: Self-pay

## 2022-05-10 ENCOUNTER — Ambulatory Visit: Payer: Medicare Other | Admitting: Internal Medicine

## 2022-05-10 VITALS — BP 149/74 | HR 77 | Temp 97.4°F | Wt 280.0 lb

## 2022-05-10 DIAGNOSIS — R7881 Bacteremia: Secondary | ICD-10-CM

## 2022-05-10 DIAGNOSIS — R059 Cough, unspecified: Secondary | ICD-10-CM | POA: Insufficient documentation

## 2022-05-10 DIAGNOSIS — B955 Unspecified streptococcus as the cause of diseases classified elsewhere: Secondary | ICD-10-CM | POA: Diagnosis not present

## 2022-05-10 DIAGNOSIS — T827XXA Infection and inflammatory reaction due to other cardiac and vascular devices, implants and grafts, initial encounter: Secondary | ICD-10-CM | POA: Diagnosis not present

## 2022-05-10 NOTE — Assessment & Plan Note (Signed)
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  No concerns today for infection at site of surgery and wound appears well healed.  His labs from SNF also appear stable.  PICC line to be removed following last dose of antibiotics and can follow up as needed for now.

## 2022-05-10 NOTE — Progress Notes (Signed)
[facility]  CHIEF COMPLAINT:    Follow up for pacemaker infection  SUBJECTIVE:    Douglas Perkins. is a 77 y.o. male with PMHx as below who presents to the clinic for pacemaker infection.   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  He underwent extraction 12/18 with implant of a leadless PPM.  His blood cx cleared as of 04/10/22 and PICC line placed 12/20 for long term OPAT through 05/29/22.  Presents today for follow up and reports doing well.  No issues with PICC or IV antibiotics. Seen by RN on 12/27 for wound check where no redness, edema noted.  Wound was well healed.    Please see A&P for the details of today's visit and status of the patient's medical problems.   Patient's Medications  New Prescriptions   No medications on file  Previous Medications   ALBUTEROL (VENTOLIN HFA) 108 (90 BASE) MCG/ACT INHALER    Inhale 1 puff into the lungs 3 (three) times daily. Or inhale 2 puffs every 8 hours as needed for SOB or wheezing.   AMLODIPINE (NORVASC) 10 MG TABLET    Take 1 tablet (10 mg total) by mouth daily.   ASPIRIN EC 81 MG TABLET    Take 1 tablet (81 mg total) by mouth daily. Reported on 08/18/2015   ATORVASTATIN (LIPITOR) 40 MG TABLET    Take 1 tablet (40 mg total) by mouth daily.   BISACODYL (DULCOLAX) 5 MG EC TABLET    Take 1 tablet (5 mg total) by mouth daily as needed for moderate constipation.   CARVEDILOL (COREG CR) 80 MG 24 HR CAPSULE    Take 80 mg by mouth daily.   DIPHENHYDRAMINE-ACETAMINOPHEN (TYLENOL PM) 25-500 MG TABS TABLET    Take 1 tablet by mouth at bedtime.   DIVALPROEX (DEPAKOTE) 500 MG DR TABLET    Take 2 tablets (1,000 mg total) by mouth at bedtime.   DORZOLAMIDE (TRUSOPT) 2 % OPHTHALMIC SOLUTION    Place 1 drop into the right eye 2 (two) times daily.   FLUTICASONE-SALMETEROL (ADVAIR HFA) 45-21 MCG/ACT INHALER    Inhale 2 puffs into the lungs 2 (two) times  daily.   GABAPENTIN (NEURONTIN) 300 MG CAPSULE    Take 1 capsule (300 mg total) by mouth 2 (two) times daily.   LATANOPROST (XALATAN) 0.005 % OPHTHALMIC SOLUTION    Place 1 drop into the right eye 2 (two) times daily.   MELATONIN 10 MG TABS    Take 10 mg by mouth at bedtime.   MULTIPLE VITAMINS-MINERALS (MULTIVITAL PO)    Take 1 tablet by mouth daily.   PANTOPRAZOLE (PROTONIX) 40 MG TABLET    Take 1 tablet (40 mg total) by mouth 2 (two) times daily.   PENICILLIN G IVPB    Inject 24 Million Units into the vein daily. Indication:  GBS ICD infection First Dose: Yes Last Day of Therapy:  05/29/22 Labs - Once weekly:  CBC/D and BMP, Labs - Every other week:  ESR and CRP Method of administration: Elastomeric (Continuous infusion) Pull PICC line at the completion of IV antibiotics Method of administration may be changed at the discretion of home infusion pharmacist based upon assessment of the patient and/or caregiver's ability to self-administer the medication ordered.   QUETIAPINE (SEROQUEL) 300 MG TABLET    Take 1 tablet (300 mg total) by mouth at bedtime.   TAMSULOSIN (  FLOMAX) 0.4 MG CAPS CAPSULE    Take 0.4 mg by mouth at bedtime.   TIMOLOL (TIMOPTIC) 0.5 % OPHTHALMIC SOLUTION    Place 1 drop into the right eye 2 (two) times daily.  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[place])    Mural thrombus of cardiac apex    Presence of permanent cardiac pacemaker 2017   SA node dysfunction   Sleep apnea    does not use CPAP   TIA (transient ischemic attack)     Social History   Tobacco Use   Smoking status: Never   Smokeless tobacco: Never  Substance Use Topics   Alcohol use: No    Alcohol/week: 0.0 standard drinks of alcohol   Drug use: No    Family History  Problem Relation  Age of Onset   Stroke Father    CAD Paternal Grandmother    CAD Paternal Grandfather     Allergies  Allergen Reactions   Sulfa Antibiotics Hives   Feldene [Piroxicam] Hives    Review of Systems  Constitutional:  Negative for fever.  Respiratory:  Positive for cough and shortness of breath.   Cardiovascular: Negative.   Gastrointestinal: Negative.      OBJECTIVE:    Vitals:   05/10/22 1412  BP: (!) 149/74  Pulse: 77  Temp: (!) 97.4 F (36.3 C)  TempSrc: Temporal  SpO2: 92%  Weight: 280 lb (127 kg)   Body mass index is 37.97 kg/m.  Physical Exam Constitutional:      General: He is not in acute distress. HENT:     Head: Normocephalic and atraumatic.  Eyes:     Extraocular Movements: Extraocular movements intact.     Conjunctiva/sclera: Conjunctivae normal.  Pulmonary:     Comments: Mildly increased WOB with nasal cannula. Abdominal:     General: There is no distension.     Palpations: Abdomen is soft.  Musculoskeletal:     Comments: Right arm PICC in place.   Skin:    General: Skin is warm and dry.  Neurological:     General: No focal deficit present.     Mental Status: He is alert and oriented to person, place, and time.  Psychiatric:        Mood and Affect: Mood normal.        Behavior: Behavior normal.      Labs and Microbiology:    Latest Ref Rng & Units 04/20/2022    5:40 AM 04/19/2022    1:55 AM 04/18/2022    2:03 AM  CBC  WBC 4.0 - 10.5 K/uL 13.2  9.3  12.4   Hemoglobin 13.0 - 17.0 g/dL 12.0  11.5  11.8   Hematocrit 39.0 - 52.0 % 34.3  33.0  33.2   Platelets 150 - 400 K/uL 398  358  373       Latest Ref Rng & Units 04/18/2022    2:03 AM 04/16/2022   11:37 PM 04/15/2022    2:17 AM  CMP  Glucose 70 - 99 mg/dL 159  143  140   BUN 8 - 23 mg/dL '10  10  9   '$ Creatinine 0.61 - 1.24 mg/dL 0.63  0.58  0.60  Sodium 135 - 145 mmol/L 131  130  132   Potassium 3.5 - 5.1 mmol/L 4.3  3.9  4.1   Chloride 98 - 111 mmol/L 100  98  97   CO2 22 -  32 mmol/L '23  25  26   '$ Calcium 8.9 - 10.3 mg/dL 8.2  8.3  8.7      No results found for this or any previous visit (from the past 240 hour(s)).  Imaging:    ASSESSMENT & PLAN:    Pacemaker infection [facility]) Patient will continue with Pencillin G 24 million units IV daily through 05/29/22 to complete 6 weeks of therapy from date of his PPM extraction in the setting of GBS bacteremia.  No concerns today for infection at site of surgery and wound appears well healed.  His labs from SNF also appear stable.  PICC line to be removed following last dose of antibiotics and can follow up as needed for now.  Cough Patient with cough and new SOB.  Recently started on oxygen and they report this is being evaluated by his facility doctor.  Recommend checking COVID, Flu, RSV if not already done.      [facility] 05/10/2022, 2:50 PM

## 2022-05-10 NOTE — Patient Instructions (Addendum)
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  No concerns today for infection at site of surgery and wound appears well healed.  His labs from SNF also appear stable.  PICC line to be removed following last dose of antibiotics and can follow up as needed for now.   Consider checking for Covid, Flu, and RSV if not already done.

## 2022-05-10 NOTE — Assessment & Plan Note (Signed)
Patient with cough and new SOB.  Recently started on oxygen and they report this is being evaluated by his facility doctor.  Recommend checking COVID, Flu, RSV if not already done.

## 2022-05-11 ENCOUNTER — Emergency Department (HOSPITAL_COMMUNITY): Payer: Medicare Other

## 2022-05-11 ENCOUNTER — Other Ambulatory Visit: Payer: Self-pay

## 2022-05-11 ENCOUNTER — Encounter (HOSPITAL_COMMUNITY): Payer: Self-pay

## 2022-05-11 ENCOUNTER — Inpatient Hospital Stay (HOSPITAL_COMMUNITY)
Admission: EM | Admit: 2022-05-11 | Discharge: 2022-05-18 | DRG: 193 | Disposition: A | Discharge status: Skilled Nursing Facility | Payer: Medicare Other | Source: Skilled Nursing Facility | Attending: Internal Medicine | Admitting: Internal Medicine

## 2022-05-11 DIAGNOSIS — D6489 Other specified anemias: Secondary | ICD-10-CM | POA: Diagnosis present

## 2022-05-11 DIAGNOSIS — Z8673 Personal history of transient ischemic attack (TIA), and cerebral infarction without residual deficits: Secondary | ICD-10-CM

## 2022-05-11 DIAGNOSIS — B955 Unspecified streptococcus as the cause of diseases classified elsewhere: Secondary | ICD-10-CM | POA: Diagnosis not present

## 2022-05-11 DIAGNOSIS — Z7982 Long term (current) use of aspirin: Secondary | ICD-10-CM

## 2022-05-11 DIAGNOSIS — Z823 Family history of stroke: Secondary | ICD-10-CM

## 2022-05-11 DIAGNOSIS — Z923 Personal history of irradiation: Secondary | ICD-10-CM | POA: Diagnosis not present

## 2022-05-11 DIAGNOSIS — I509 Heart failure, unspecified: Secondary | ICD-10-CM

## 2022-05-11 DIAGNOSIS — I110 Hypertensive heart disease with heart failure: Secondary | ICD-10-CM | POA: Diagnosis present

## 2022-05-11 DIAGNOSIS — Y95 Nosocomial condition: Secondary | ICD-10-CM

## 2022-05-11 DIAGNOSIS — J9601 Acute respiratory failure with hypoxia: Secondary | ICD-10-CM

## 2022-05-11 DIAGNOSIS — E871 Hypo-osmolality and hyponatremia: Secondary | ICD-10-CM

## 2022-05-11 DIAGNOSIS — T501X5A Adverse effect of loop [high-ceiling] diuretics, initial encounter: Secondary | ICD-10-CM | POA: Diagnosis present

## 2022-05-11 DIAGNOSIS — I38 Endocarditis, valve unspecified: Secondary | ICD-10-CM | POA: Diagnosis not present

## 2022-05-11 DIAGNOSIS — Z6836 Body mass index (BMI) 36.0-36.9, adult: Secondary | ICD-10-CM | POA: Diagnosis not present

## 2022-05-11 DIAGNOSIS — D649 Anemia, unspecified: Secondary | ICD-10-CM | POA: Diagnosis not present

## 2022-05-11 DIAGNOSIS — Z1152 Encounter for screening for COVID-19: Secondary | ICD-10-CM | POA: Diagnosis not present

## 2022-05-11 DIAGNOSIS — E876 Hypokalemia: Secondary | ICD-10-CM | POA: Diagnosis present

## 2022-05-11 DIAGNOSIS — D72829 Elevated white blood cell count, unspecified: Secondary | ICD-10-CM

## 2022-05-11 DIAGNOSIS — J189 Pneumonia, unspecified organism: Secondary | ICD-10-CM | POA: Diagnosis not present

## 2022-05-11 DIAGNOSIS — E669 Obesity, unspecified: Secondary | ICD-10-CM | POA: Diagnosis present

## 2022-05-11 DIAGNOSIS — I2510 Atherosclerotic heart disease of native coronary artery without angina pectoris: Secondary | ICD-10-CM | POA: Diagnosis present

## 2022-05-11 DIAGNOSIS — R7881 Bacteremia: Secondary | ICD-10-CM | POA: Diagnosis not present

## 2022-05-11 DIAGNOSIS — Z79899 Other long term (current) drug therapy: Secondary | ICD-10-CM

## 2022-05-11 DIAGNOSIS — Z85821 Personal history of Merkel cell carcinoma: Secondary | ICD-10-CM

## 2022-05-11 DIAGNOSIS — Z950 Presence of cardiac pacemaker: Secondary | ICD-10-CM | POA: Diagnosis not present

## 2022-05-11 DIAGNOSIS — F319 Bipolar disorder, unspecified: Secondary | ICD-10-CM | POA: Diagnosis present

## 2022-05-11 DIAGNOSIS — G4733 Obstructive sleep apnea (adult) (pediatric): Secondary | ICD-10-CM | POA: Diagnosis present

## 2022-05-11 DIAGNOSIS — Z7951 Long term (current) use of inhaled steroids: Secondary | ICD-10-CM | POA: Diagnosis not present

## 2022-05-11 DIAGNOSIS — I5033 Acute on chronic diastolic (congestive) heart failure: Secondary | ICD-10-CM | POA: Diagnosis not present

## 2022-05-11 DIAGNOSIS — T827XXD Infection and inflammatory reaction due to other cardiac and vascular devices, implants and grafts, subsequent encounter: Secondary | ICD-10-CM | POA: Diagnosis not present

## 2022-05-11 DIAGNOSIS — Z8249 Family history of ischemic heart disease and other diseases of the circulatory system: Secondary | ICD-10-CM | POA: Diagnosis not present

## 2022-05-11 DIAGNOSIS — I5030 Unspecified diastolic (congestive) heart failure: Secondary | ICD-10-CM | POA: Diagnosis present

## 2022-05-11 LAB — CBC WITH DIFFERENTIAL/PLATELET
Abs Immature Granulocytes: 0.07 10*3/uL (ref 0.00–0.07)
Basophils Absolute: 0 10*3/uL (ref 0.0–0.1)
Basophils Relative: 0 %
Eosinophils Absolute: 0 10*3/uL (ref 0.0–0.5)
Eosinophils Relative: 0 %
HCT: 32.9 % — ABNORMAL LOW (ref 39.0–52.0)
Hemoglobin: 10.6 g/dL — ABNORMAL LOW (ref 13.0–17.0)
Immature Granulocytes: 1 %
Lymphocytes Relative: 6 %
Lymphs Abs: 0.8 10*3/uL (ref 0.7–4.0)
MCH: 31.2 pg (ref 26.0–34.0)
MCHC: 32.2 g/dL (ref 30.0–36.0)
MCV: 96.8 fL (ref 80.0–100.0)
Monocytes Absolute: 1.3 10*3/uL — ABNORMAL HIGH (ref 0.1–1.0)
Monocytes Relative: 11 %
Neutro Abs: 9.7 10*3/uL — ABNORMAL HIGH (ref 1.7–7.7)
Neutrophils Relative %: 82 %
Platelets: 241 10*3/uL (ref 150–400)
RBC: 3.4 MIL/uL — ABNORMAL LOW (ref 4.22–5.81)
RDW: 13.6 % (ref 11.5–15.5)
WBC: 11.8 10*3/uL — ABNORMAL HIGH (ref 4.0–10.5)
nRBC: 0 % (ref 0.0–0.2)

## 2022-05-11 LAB — I-STAT ARTERIAL BLOOD GAS, ED
Acid-base deficit: 4 mmol/L — ABNORMAL HIGH (ref 0.0–2.0)
Bicarbonate: 20.6 mmol/L (ref 20.0–28.0)
Calcium, Ion: 1.2 mmol/L (ref 1.15–1.40)
HCT: 32 % — ABNORMAL LOW (ref 39.0–52.0)
Hemoglobin: 10.9 g/dL — ABNORMAL LOW (ref 13.0–17.0)
O2 Saturation: 91 %
Patient temperature: 98.2
Potassium: 4.7 mmol/L (ref 3.5–5.1)
Sodium: 132 mmol/L — ABNORMAL LOW (ref 135–145)
TCO2: 22 mmol/L (ref 22–32)
pCO2 arterial: 35.8 mmHg (ref 32–48)
pH, Arterial: 7.367 (ref 7.35–7.45)
pO2, Arterial: 62 mmHg — ABNORMAL LOW (ref 83–108)

## 2022-05-11 LAB — BASIC METABOLIC PANEL
Anion gap: 8 (ref 5–15)
BUN: 13 mg/dL (ref 8–23)
CO2: 23 mmol/L (ref 22–32)
Calcium: 8.2 mg/dL — ABNORMAL LOW (ref 8.9–10.3)
Chloride: 100 mmol/L (ref 98–111)
Creatinine, Ser: 0.7 mg/dL (ref 0.61–1.24)
GFR, Estimated: >60 mL/min (ref >60)
Glucose, Bld: 143 mg/dL — ABNORMAL HIGH (ref 70–99)
Potassium: 4.7 mmol/L (ref 3.5–5.1)
Sodium: 131 mmol/L — ABNORMAL LOW (ref 135–145)

## 2022-05-11 LAB — RESP PANEL BY RT-PCR (RSV, FLU A&B, COVID)  RVPGX2
Influenza A by PCR: NEGATIVE
Influenza B by PCR: NEGATIVE
Resp Syncytial Virus by PCR: NEGATIVE
SARS Coronavirus 2 by RT PCR: NEGATIVE

## 2022-05-11 LAB — PROCALCITONIN: Procalcitonin: 0.1 ng/mL

## 2022-05-11 LAB — BRAIN NATRIURETIC PEPTIDE: B Natriuretic Peptide: 1118.5 pg/mL — ABNORMAL HIGH (ref 0.0–100.0)

## 2022-05-11 MED ORDER — HYDRALAZINE HCL 20 MG/ML IJ SOLN: 10.0000 mg | INTRAMUSCULAR | Status: DC | PRN

## 2022-05-11 MED ORDER — ENOXAPARIN SODIUM 60 MG/0.6ML IJ SOSY
50.0000 mg | PREFILLED_SYRINGE | INTRAMUSCULAR | Status: DC
  Administered 2022-05-11 – 2022-05-18 (×8): 50 mg via SUBCUTANEOUS
  Filled 2022-05-11 (×9): qty 0.6

## 2022-05-11 MED ORDER — VANCOMYCIN HCL IN DEXTROSE 1-5 GM/200ML-% IV SOLN
1000.0000 mg | Freq: Once | INTRAVENOUS | Status: AC
  Administered 2022-05-11: 1000 mg via INTRAVENOUS
  Filled 2022-05-11: qty 200

## 2022-05-11 MED ORDER — FUROSEMIDE 10 MG/ML IJ SOLN
40.0000 mg | Freq: Two times a day (BID) | INTRAMUSCULAR | Status: DC
  Administered 2022-05-11 – 2022-05-12 (×3): 40 mg via INTRAVENOUS
  Filled 2022-05-11 (×3): qty 4

## 2022-05-11 MED ORDER — FUROSEMIDE 10 MG/ML IJ SOLN
40.0000 mg | Freq: Once | INTRAMUSCULAR | Status: AC
  Administered 2022-05-11: 40 mg via INTRAVENOUS
  Filled 2022-05-11: qty 4

## 2022-05-11 MED ORDER — ACETAMINOPHEN 650 MG RE SUPP: 650.0000 mg | Freq: Four times a day (QID) | RECTAL | Status: DC | PRN

## 2022-05-11 MED ORDER — PANTOPRAZOLE SODIUM 40 MG IV SOLR
40.0000 mg | INTRAVENOUS | Status: DC
  Administered 2022-05-11 – 2022-05-12 (×2): 40 mg via INTRAVENOUS
  Filled 2022-05-11 (×2): qty 10

## 2022-05-11 MED ORDER — SODIUM CHLORIDE 0.9% FLUSH
3.0000 mL | Freq: Two times a day (BID) | INTRAVENOUS | Status: DC
  Administered 2022-05-11 – 2022-05-13 (×6): 3 mL via INTRAVENOUS

## 2022-05-11 MED ORDER — VANCOMYCIN HCL 1250 MG/250ML IV SOLN
1250.0000 mg | Freq: Two times a day (BID) | INTRAVENOUS | Status: DC
  Administered 2022-05-11 – 2022-05-14 (×6): 1250 mg via INTRAVENOUS
  Filled 2022-05-11 (×9): qty 250

## 2022-05-11 MED ORDER — SODIUM CHLORIDE 0.9 % IV SOLN
2.0000 g | INTRAVENOUS | Status: AC
  Administered 2022-05-11: 2 g via INTRAVENOUS
  Filled 2022-05-11: qty 12.5

## 2022-05-11 MED ORDER — ALBUTEROL SULFATE (2.5 MG/3ML) 0.083% IN NEBU
2.5000 mg | INHALATION_SOLUTION | RESPIRATORY_TRACT | Status: DC | PRN
  Administered 2022-05-11 – 2022-05-16 (×3): 2.5 mg via RESPIRATORY_TRACT
  Filled 2022-05-11 (×3): qty 3

## 2022-05-11 MED ORDER — ACETAMINOPHEN 325 MG PO TABS: 650.0000 mg | ORAL_TABLET | Freq: Four times a day (QID) | ORAL | Status: DC | PRN

## 2022-05-11 MED ORDER — SODIUM CHLORIDE 0.9 % IV SOLN
2.0000 g | Freq: Three times a day (TID) | INTRAVENOUS | Status: AC
  Administered 2022-05-11 – 2022-05-15 (×14): 2 g via INTRAVENOUS
  Filled 2022-05-11 (×14): qty 12.5

## 2022-05-11 MED ORDER — ALBUTEROL SULFATE (2.5 MG/3ML) 0.083% IN NEBU
5.0000 mg | INHALATION_SOLUTION | Freq: Once | RESPIRATORY_TRACT | Status: AC
  Administered 2022-05-11: 5 mg via RESPIRATORY_TRACT
  Filled 2022-05-11: qty 6

## 2022-05-11 NOTE — ED Notes (Signed)
RT called at this time due to patient's oxygen dropping down to 86%. This RN turned patient's oxygen up to 6L [place] and administered breathing treatment with no relief. Patient continues to spit out thick sputum into emesis bag. Admitting physician notified at this time about patient's work of breathing. He states that he has changed the bipap order to as needed. RT at bedside

## 2022-05-11 NOTE — ED Notes (Signed)
RT at bedside.

## 2022-05-11 NOTE — ED Notes (Signed)
RT called at this time to inform of patient's increased work of breathing. Patient is currently on 5L [place] satting 90%.

## 2022-05-11 NOTE — TOC Initial Note (Signed)
Transition of Care (TOC) - Initial/Assessment Note    Patient Details  Name: Douglas Perkins. MRN: [id number] Date of Birth: [birth year]  Transition of Care [facility]) CM/SW Contact:    Verdell Carmine, RN Phone Number: 05/11/2022, 5:43 PM  Clinical Narrative:                 Patient from [facility] place, started 2 weeks ago for 6 week IV antibiotics due to implant infection. Presents now with higher oxygen demand, Pneumonia. Was on 3LPPM at SNF, increased to 6LPM. Vancomycin added to antibiotic regemin. CSW consulted, PT consult in. Will likely need SNF post hospitalization.   Expected Discharge Plan: Skilled Nursing Facility Barriers to Discharge: Continued Medical Work up   Patient Goals and CMS Choice      SNF      Expected Discharge Plan and Services In-house Referral: Clinical Social Work     Living arrangements for the past 2 months: [place]                                      Prior Living Arrangements/Services Living arrangements for the past 2 months: [place] Lives with:: Facility Resident Patient language and need for interpreter reviewed:: Yes        Need for Family Participation in Patient Care: Yes (Comment) Care giver support system in place?: Yes (comment)   Criminal Activity/Legal Involvement Pertinent to Current Situation/Hospitalization: No - Comment as needed  Activities of Daily Living      Permission Sought/Granted                  Emotional Assessment       Orientation: : Oriented to Self Alcohol / Substance Use: Not Applicable Psych Involvement: No (comment)  Admission diagnosis:  Multifocal pneumonia [J18.9] Patient Active Problem List   Diagnosis Date Noted   Multifocal pneumonia 05/11/2022   Cough 05/10/2022   Pacemaker infection ([place]) 04/12/2022   Streptococcal bacteremia 04/12/2022   Sepsis ([place]) 04/10/2022   Prediabetes 03/06/2021   Bipolar affective disorder, current episode  manic ([place]) 03/03/2021   Pressure injury of skin 37/07/8887   Acute metabolic encephalopathy 16/94/5038   Hyponatremia syndrome 02/18/2021   OSA (obstructive sleep apnea) 11/26/2019   Cardiac pacemaker 04/30/2019   History of sinoatrial node dysfunction 04/30/2019   Other male erectile dysfunction 11/12/2018   Near syncope 05/22/2017   Coronary artery disease involving native coronary artery of native heart 01/10/2017   Low serum testosterone level 06/16/2016   Obesity (BMI 30-39.9) 03/09/2016   Syncope 07/19/2015   SIRS (systemic inflammatory response syndrome) ([place]) 07/07/2015   Bipolar I disorder ([place]) 06/24/2015   Bilateral pneumonia 06/23/2015   Bipolar affective disorder, currently manic, mild ([place]) 06/18/2015   History of duodenal ulcer 06/18/2015   Vaccine counseling 06/18/2015   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 [place], [place] - [address] [place] [address] Phone: [phone] Fax: [id number] [place], [place] [address] [facility] OF [address] [place] [address] Phone: [phone] Fax: [phone]     Social Determinants of Health (SDOH) Social History: [place]: No Food Insecurity (04/11/2022)  Housing: Low Risk  (04/16/2022)  Transportation Needs: No Transportation Needs (04/16/2022)  Utilities: Not At Risk (04/16/2022)  Alcohol Screen: Low Risk  (03/03/2021)  Tobacco Use: Low Risk  (05/11/2022)   SDOH Interventions:     Readmission Risk Interventions     No data to display

## 2022-05-11 NOTE — ED Notes (Signed)
RT at bedside to evaluate patient. Patient states his breathing is fine and that he feels fine, denies feeling any distress at this time.

## 2022-05-11 NOTE — H&P (Signed)
History and Physical    Patient: Douglas Perkins. MHD:[id number] DOB: 02/16/1946 DOA: 05/11/2022 DOS: the patient was seen and examined on 05/11/2022 PCP: Pcp, No  Patient coming from: [facility] via EMS  Chief Complaint:  Chief Complaint  Patient presents with   Shortness of Breath   HPI: [name]. is a 77 y.o. 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  History is somewhat limited due to the patient being on BiPAP and hard of hearing.  Patient reports having a cough with white to brown sputum production.  Patient denies having any complaints of fever, chest pain, nausea, vomiting, dysuria, or diarrhea.  At baseline patient has difficult time ambulating and uses a rollator.    Patient had recently been hospitalized from 12/10-12/22/2023 for sepsis due to GBS bacteremia with pacemaker infection.  Patient underwent extraction of the previous pacemaker on 2/18 and had a leadless pacemaker placed at that time.  Plan was for patient to be on IV antibiotics until 1/29.  In room with EMS patient was noted to have O2 saturations of 82% on 5 L of nasal cannula oxygen.  Patient was given albuterol treatment and route with O2 sat is 88% on 6 L.  In the emergency department patient was noted to be afebrile with tachypnea, and O2 saturations initially as low as 88% with O2 saturations improved to greater than 92% on BiPAP with FiO2 at 40%.  Labs significant for WBC 11.8, hemoglobin 10.6, sodium 131, and BNP 1118.5.  ABG noted pH 7.367, pCO2 35.8, and pO2 62.  Screening for influenza, COVID-19, and RSV were negative.  Chest x-ray noted right PICC cardiomegaly with increased central vascular prominence, interstitial and patchy airspace opacities throughout the lungs right greater than left,  and small  pleural effusions.  Blood cultures have been obtained.  Patient had been given albuterol breathing treatment, Lasix 40 mg IV, vancomycin, and cefepime.  Review of Systems: As mentioned in the history of present illness. All other systems reviewed and are negative. Past Medical History:  Diagnosis Date   Bipolar 1 disorder ([place])    Cancer ([place])    prostate   Glaucoma    Hypertension    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[place])    Mural thrombus of cardiac apex    Presence of permanent cardiac pacemaker 2017   SA node dysfunction   Sleep apnea    does not use CPAP   TIA (transient ischemic attack)    Past Surgical History:  Procedure Laterality Date   BUBBLE STUDY  04/14/2022   Procedure: BUBBLE STUDY;  Surgeon: Lelon Perla, MD;  Location: [facility];  Service: Cardiovascular;;   CHOLECYSTECTOMY     COLONOSCOPY WITH PROPOFOL N/A 01/31/2017   Procedure: COLONOSCOPY WITH PROPOFOL;  Surgeon: Manya Silvas, MD;  Location: [facility] ENDOSCOPY;  Service: Endoscopy;  Laterality: N/A;   DRUG INDUCED ENDOSCOPY N/A 05/12/2020   Procedure: DRUG INDUCED ENDOSCOPY;  Surgeon: Melida Quitter, MD;  Location: [facility];  Service: ENT;  Laterality: N/A;   IMPLANTATION OF HYPOGLOSSAL NERVE STIMULATOR N/A 07/21/2020   Procedure: IMPLANTATION OF HYPOGLOSSAL NERVE STIMULATOR;  Surgeon: Melida Quitter, MD;  Location: [facility];  Service: ENT;  Laterality: N/A;   LEAD EXTRACTION N/A 04/17/2022   Procedure: LEAD EXTRACTION;  Surgeon: Lars Mage  T, MD;  Location: [facility];  Service: Cardiovascular;  Laterality: N/A;   LEG SURGERY Left    distal   Pace maker placement     PROSTATE SURGERY     PROSTATECTOMY     SKIN CANCER EXCISION  2006,2007   Merkle Cell Carcinoma   TEE WITHOUT CARDIOVERSION N/A 04/14/2022   Procedure: TRANSESOPHAGEAL ECHOCARDIOGRAM (TEE);  Surgeon: Lelon Perla, MD;  Location: [facility] ENDOSCOPY;  Service: Cardiovascular;  Laterality: N/A;   Social  History:  reports that he has never smoked. He has never used smokeless tobacco. He reports that he does not drink alcohol and does not use drugs.  Allergies  Allergen Reactions   Sulfa Antibiotics Hives   Feldene [Piroxicam] Hives    Family History  Problem Relation Age of Onset   Stroke Father    CAD Paternal Grandmother    CAD Paternal Grandfather     Prior to Admission medications   Medication Sig Start Date End Date Taking? Authorizing Provider  acetaminophen (TYLENOL) 500 MG tablet Take 1,000 mg by mouth in the morning and at bedtime. May also 2 tablets by mouth daily as needed for knee pain   Yes [provider]  albuterol (VENTOLIN HFA) 108 (90 Base) MCG/ACT inhaler Inhale 1 puff into the lungs every 8 (eight) hours as needed for wheezing or shortness of breath. 08/26/21  Yes [provider]  amLODipine (NORVASC) 10 MG tablet Take 1 tablet (10 mg total) by mouth daily. 03/09/21  Yes Clapacs, Madie Reno, MD  aspirin EC 81 MG tablet Take 1 tablet (81 mg total) by mouth daily. Reported on 08/18/2015 03/09/21  Yes Clapacs, Madie Reno, MD  atorvastatin (LIPITOR) 40 MG tablet Take 1 tablet (40 mg total) by mouth daily. Patient taking differently: Take 40 mg by mouth at bedtime. 03/10/21  Yes Clapacs, Madie Reno, MD  bisacodyl (DULCOLAX) 5 MG EC tablet Take 1 tablet (5 mg total) by mouth daily as needed for moderate constipation. 03/09/21  Yes Clapacs, Madie Reno, MD  carvedilol (COREG CR) 80 MG 24 hr capsule Take 80 mg by mouth daily.   Yes [provider]  dextromethorphan 7.5 MG/5ML SYRP Take 15 mg by mouth every 6 (six) hours as needed (for cough).   Yes [provider]  divalproex (DEPAKOTE) 500 MG DR tablet Take 2 tablets (1,000 mg total) by mouth at bedtime. 03/09/21  Yes Clapacs, Madie Reno, MD  dorzolamide (TRUSOPT) 2 % ophthalmic solution Place 1 drop into the right eye 2 (two) times daily. 03/09/21  Yes Clapacs, Madie Reno, MD  fluticasone-salmeterol (ADVAIR HFA) [phone]  MCG/ACT inhaler Inhale 2 puffs into the lungs 2 (two) times daily.   Yes [provider]  gabapentin (NEURONTIN) 300 MG capsule Take 1 capsule (300 mg total) by mouth 2 (two) times daily. 03/09/21  Yes Clapacs, Madie Reno, MD  ipratropium-albuterol (DUONEB) 0.5-2.5 (3) MG/3ML SOLN Take 3 mLs by nebulization once.   Yes [provider]  LACTOBACILLUS PROBIOTIC PO Take 1 capsule by mouth daily.   Yes [provider]  latanoprost (XALATAN) 0.005 % ophthalmic solution Place 1 drop into the right eye 2 (two) times daily. 03/09/21  Yes Clapacs, Madie Reno, MD  losartan (COZAAR) 25 MG tablet Take 12.5 mg by mouth daily.   Yes [provider]  Melatonin 10 MG TABS Take 10 mg by mouth at bedtime.   Yes [provider]  Multiple Vitamins-Minerals (MULTIVITAL PO) Take 1 tablet by mouth daily.  Yes [provider]  nystatin powder Apply 1 Application topically 2 (two) times daily. Apply topically twice a day to groin region, bilateral buttocks, sacrum and inner upper thighs   Yes [provider]  pantoprazole (PROTONIX) 40 MG tablet Take 1 tablet (40 mg total) by mouth 2 (two) times daily. 03/09/21  Yes Clapacs, Madie Reno, MD  penicillin G IVPB Inject 24 Million Units into the vein daily. Indication:  GBS ICD infection First Dose: Yes Last Day of Therapy:  05/29/22 Labs - Once weekly:  CBC/D and BMP, Labs - Every other week:  ESR and CRP Method of administration: Elastomeric (Continuous infusion) Pull PICC line at the completion of IV antibiotics Method of administration may be changed at the discretion of home infusion pharmacist based upon assessment of the patient and/or caregiver's ability to self-administer the medication ordered. 04/20/22 05/31/22 Yes Patrecia Pour, MD  QUEtiapine (SEROQUEL) 300 MG tablet Take 1 tablet (300 mg total) by mouth at bedtime. 03/09/21  Yes Clapacs, Madie Reno, MD  tamsulosin (FLOMAX) 0.4 MG CAPS capsule Take 0.4 mg by mouth at  bedtime.   Yes [provider]  timolol (TIMOPTIC) 0.5 % ophthalmic solution Place 1 drop into the right eye 2 (two) times daily. 03/09/21  Yes Clapacs, Madie Reno, MD    Physical Exam: Vitals:   05/11/22 0305 05/11/22 0314 05/11/22 0400 05/11/22 0649  BP:  (!) 154/81 126/72   Pulse:  78 78   Resp:  (!) 30 (!) 29   Temp:    98.8 F (37.1 C)  TempSrc:    Oral  SpO2:   100%   Weight: 116.1 kg     Height: 6' (1.829 m)      Exam  Constitutional: Elderly male who appears to be no acute distress Eyes: PERRL, lids and conjunctivae normal ENMT: Mucous membranes are moist. Hard of hearing Neck: normal, supple, no masses, no thyromegaly Respiratory: Decreased overall aeration with some crackles heard in lower lung fields.  No significant wheezes or rhonchi appreciated.  Patient currently still on BiPAP Cardiovascular: Regular rate and rhythm, no murmurs / rubs / gallops.  +1 pitting lower extremity edema Abdomen: no tenderness, no masses palpated. No hepatosplenomegaly. Bowel sounds positive.  Musculoskeletal: no clubbing / cyanosis. No joint deformity upper and lower extremities.  Muscle wasting of the bilateral lower extremities. Skin: no rashes, lesions, ulcers. No induration Neurologic: CN 2-12 grossly intact.  Able to move all extremities Psychiatric: Normal judgment and insight. Alert and oriented x 3. Normal mood.   Data Reviewed:  EKG reveals sinus rhythm at 78 bpm.  Reviewed labs, imaging and pertinent records as noted above in HPI Assessment and Plan:  Acute on chronic respiratory failure with hypoxia secondary to heart failure with preserved EF and possible healthcare associated pneumonia Patient presents with complaints of worsening shortness of breath over the last 2 days.  Found to be tachypneic without any other SIRS criteria met at that time.  O2 saturations as low as 82% on 5 L of nasal cannula oxygen, but patient normally reports not being on oxygen.  ABG was  compensated, but noted pO2 of 62.  On physical exam patient noted to have some crackles with lower extremity pitting edema.  He was placed on BiPAP due to respiratory distress with improvement of work of breathing.  Chest x-ray gave concern for possible edema and/or pneumonia.  BNP elevated at 1118.5.  Influenza, RSV and COVID-19 screening were negative.  Patient has been given  Lasix 40 mg IV, vancomycin, and cefepime given recent hospital patient -Admit to a progressive bed -Continuous pulse oximetry with oxygen to maintain O2 saturation greater than 92% -BiPAP and wean off once able -Follow-up blood cultures -Check procalcitonin -Continue empiric antibiotics of vancomycin and cefepime  -Continue Lasix 40 mg IV twice daily  Leukocytosis Acute.  WBC elevated 11.8.  Suspect secondary to above. -Recheck CBC tomorrow morning  GBS bacteremia with pacemaker infection Prior to admission.  Patient had removal of prior pacemaker and leadless placed on 04/17/2022.  Plan was to continue patient on penicillin V through January 29. -Held penicillin V as patient should have adequate coverage with medications as noted above -Follow-up repeat cultures and consult ID if warranted in a.m.  Normocytic anemia Acute.  Hemoglobin 10.6 on admission, but previously had been in the range of 11-12. -Continue to monitor  Hyponatremia Acute on chronic.  Sodium 131.  Suspect possibly hypervolemic hyponatremia in the setting of patient appearing to be fluid overloaded. -Follow-up repeat sodium present with +1 pitting lower extremity edema.  After diuresis  Obstructive sleep apnea Patient is not on CPAP because he had a a Inspire device placed  Bipolar disorder -Continue current home meds  GI prophylaxis: Protonix IV DVT prophylaxis: Lovenox Advance Care Planning:   Code Status: Full Code   Consults: None   Severity of Illness: The appropriate patient status for this patient is INPATIENT. Inpatient status  is judged to be reasonable and necessary in order to provide the required intensity of service to ensure the patient's safety. The patient's presenting symptoms, physical exam findings, and initial radiographic and laboratory data in the context of their chronic comorbidities is felt to place them at high risk for further clinical deterioration. Furthermore, it is not anticipated that the patient will be medically stable for discharge from the hospital within 2 midnights of admission.   * I certify that at the point of admission it is my clinical judgment that the patient will require inpatient hospital care spanning beyond 2 midnights from the point of admission due to high intensity of service, high risk for further deterioration and high frequency of surveillance required.*  Author: Norval Morton, MD 05/11/2022 7:13 AM  For on call review [email or web address].

## 2022-05-11 NOTE — ED Notes (Signed)
Called lab to have procal added to previous sample. Per lab, they will add it on.

## 2022-05-11 NOTE — ED Notes (Signed)
Pt not in room at this time. Previous RN waiting for RT to assist in transport of pt to room 4

## 2022-05-11 NOTE — ED Notes (Signed)
Dressing on patient's PICC line coming up on the bottom. Dressing reinforced with tape. Patient's gown was soiled and changed. Patient boosted in bed

## 2022-05-11 NOTE — ED Triage Notes (Signed)
Pt from [facility] via [facility] for SOB x 2 days. Pt is on 3L O2 via [place] at baseline and facility increased to 5L today due to increased dyspnea. Pt was 82% on 5L O2 via [place] on EMS arival. Albuterol 7.5 given enroute. Pt sats 88% on 6L [place] upon arrival at ED

## 2022-05-11 NOTE — Progress Notes (Signed)
Pharmacy Antibiotic Note  Douglas Perkins. is a 77 y.o. male admitted on 05/11/2022 with sepsis. Pt recently hospitalized with GBS bacteremia 2/2 pacemaker infection and is being followed by ID outpatient. Pt has been receiving Penicillin G 24 million units IV daily to continue through 05/29/22 for a total of 6 weeks of therapy from PPM extraction. Pt now with a new cough c/f HCAP. Pharmacy has been consulted for Vancomycin dosing.  WBC 11.8, afebrile SCr 0.7  Cefepime 2g IV x1 given at 04:13 in ED on 1/11. Vancomycin '1000mg'$  IV x1 given at 04:19 in ED on 1/11.  Plan: Vancomycin '1250mg'$  IV q12h (eAUC ~515)    > Goal AUC 400-550    > Check vancomycin levels at steady state  Continue Cefepime 2g IV q8h per MD Monitor daily CBC, temp, SCr, and for clinical signs of improvement  F/u cultures and de-escalate antibiotics as able   Height: 6' (182.9 cm) Weight: 116.1 kg (256 lb) IBW/kg (Calculated) : 77.6  Temp (24hrs), Avg:98.1 F (36.7 C), Min:97.4 F (36.3 C), Max:98.8 F (37.1 C)  Recent Labs  Lab 05/11/22 0301  WBC 11.8*  CREATININE 0.70    Estimated Creatinine Clearance: 100.1 mL/min (by C-G formula based on SCr of 0.7 mg/dL).    Allergies  Allergen Reactions   Sulfa Antibiotics Hives   Feldene [Piroxicam] Hives    Antimicrobials this admission: Cefepime 1/11 >>  Vancomycin 1/11 >>   Dose adjustments this admission: N/A  Microbiology results: 1/11 BCx: pending  Thank you for allowing pharmacy to be a part of this patient's care.  Luisa Hart, PharmD, BCPS Clinical Pharmacist 05/11/2022 8:23 AM   Please refer to [facility]ION for pharmacy phone number

## 2022-05-11 NOTE — Progress Notes (Signed)
Patient taken off bipap and placed on 3L [place]. Patient has increased WOB with bipap off. Patient was transported to ED room 4. Bipap was placed back on patient at this time.

## 2022-05-11 NOTE — ED Notes (Signed)
Pt placed on Bipap due to increased work of breathing

## 2022-05-11 NOTE — ED Notes (Signed)
Male Pure Wick placed on patient suction on 90.

## 2022-05-11 NOTE — ED Provider Notes (Addendum)
[facility] EMERGENCY DEPARTMENT Provider Note   CSN: [id number] Arrival date & time: 05/11/22  0257     History  Chief Complaint  Patient presents with   Shortness of Breath    Douglas Perkins. is a 77 y.o. male.  The history is provided by medical records and the EMS personnel. The history is limited by the condition of the patient.  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  Seen for this by infectious disease who planned covid and flu testing.        Home Medications Prior to Admission medications   Medication Sig Start Date End Date Taking? Authorizing Provider  albuterol (VENTOLIN HFA) 108 (90 Base) MCG/ACT inhaler Inhale 1 puff into the lungs 3 (three) times daily. Or inhale 2 puffs every 8 hours as needed for SOB or wheezing. 08/26/21   [provider]  amLODipine (NORVASC) 10 MG tablet Take 1 tablet (10 mg total) by mouth daily. 03/09/21   Clapacs, Madie Reno, MD  aspirin EC 81 MG tablet Take 1 tablet (81 mg total) by mouth daily. Reported on 08/18/2015 03/09/21   Clapacs, Madie Reno, MD  atorvastatin (LIPITOR) 40 MG tablet Take 1 tablet (40 mg total) by mouth daily. 03/10/21   Clapacs, Madie Reno, MD  bisacodyl (DULCOLAX) 5 MG EC tablet Take 1 tablet (5 mg total) by mouth daily as needed for moderate constipation. 03/09/21   Clapacs, Madie Reno, MD  carvedilol (COREG CR) 80 MG 24 hr capsule Take 80 mg by mouth daily.    [provider]  diphenhydramine-acetaminophen (TYLENOL PM) 25-500 MG TABS tablet Take 1 tablet by mouth at  bedtime.    [provider]  divalproex (DEPAKOTE) 500 MG DR tablet Take 2 tablets (1,000 mg total) by mouth at bedtime. 03/09/21   Clapacs, Madie Reno, MD  dorzolamide (TRUSOPT) 2 % ophthalmic solution Place 1 drop into the right eye 2 (two) times daily. 03/09/21   Clapacs, Madie Reno, MD  fluticasone-salmeterol (ADVAIR HFA) [phone] MCG/ACT inhaler Inhale 2 puffs into the lungs 2 (two) times daily.    [provider]  gabapentin (NEURONTIN) 300 MG capsule Take 1 capsule (300 mg total) by mouth 2 (two) times daily. 03/09/21   Clapacs, Madie Reno, MD  latanoprost (XALATAN) 0.005 % ophthalmic solution Place 1 drop into the right eye 2 (two) times daily. 03/09/21   Clapacs, Madie Reno, MD  Melatonin 10 MG TABS Take 10 mg by mouth at bedtime.    [provider]  Multiple Vitamins-Minerals (MULTIVITAL PO) Take 1 tablet by mouth daily.    [provider]  pantoprazole (PROTONIX) 40 MG tablet Take 1 tablet (40 mg total) by mouth 2 (two) times daily. 03/09/21   Clapacs, Madie Reno, MD  penicillin G IVPB Inject 24 Million Units into the vein daily. Indication:  GBS ICD infection First Dose: Yes Last Day of Therapy:  05/29/22 Labs - Once weekly:  CBC/D and BMP, Labs - Every other week:  ESR and CRP Method of administration: Elastomeric (Continuous infusion) Pull PICC line at the completion  of IV antibiotics Method of administration may be changed at the discretion of home infusion pharmacist based upon assessment of the patient and/or caregiver's ability to self-administer the medication ordered. 04/20/22 05/31/22  Patrecia Pour, MD  QUEtiapine (SEROQUEL) 300 MG tablet Take 1 tablet (300 mg total) by mouth at bedtime. 03/09/21   Clapacs, Madie Reno, MD  tamsulosin (FLOMAX) 0.4 MG CAPS capsule Take 0.4 mg by mouth at bedtime.    [provider]  timolol (TIMOPTIC) 0.5 % ophthalmic solution Place 1 drop into the right eye 2 (two) times daily. 03/09/21   Clapacs, Madie Reno, MD      Allergies     Sulfa antibiotics and Feldene [piroxicam]    Review of Systems   Review of Systems  Unable to perform ROS: Acuity of condition  Constitutional:  Negative for fever.  HENT:  Negative for facial swelling.   Eyes:  Negative for redness.  Respiratory:  Positive for cough and shortness of breath.     Physical Exam Updated Vital Signs BP 126/72   Pulse 78   Temp 98.2 F (36.8 C) (Oral)   Resp (!) 29   Ht 6' (1.829 m)   Wt 116.1 kg   SpO2 100%   BMI 34.72 kg/m  Physical Exam Vitals and nursing note reviewed. Exam conducted with a chaperone present.  Constitutional:      General: He is not in acute distress.    Appearance: He is well-developed. He is not diaphoretic.  HENT:     Head: Normocephalic and atraumatic.     Nose: Nose normal.  Eyes:     Conjunctiva/sclera: Conjunctivae normal.     Pupils: Pupils are equal, round, and reactive to light.  Cardiovascular:     Rate and Rhythm: Normal rate and regular rhythm.     Pulses: Normal pulses.     Heart sounds: Normal heart sounds.  Pulmonary:     Effort: Pulmonary effort is normal. Tachypnea present.     Breath sounds: Decreased air movement present. Wheezing present. No rales.  Abdominal:     General: Bowel sounds are normal.     Palpations: Abdomen is soft.     Tenderness: There is no abdominal tenderness. There is no guarding or rebound.  Musculoskeletal:        General: Normal range of motion.     Cervical back: Normal range of motion and neck supple.  Skin:    General: Skin is warm and dry.     Capillary Refill: Capillary refill takes less than 2 seconds.  Neurological:     General: No focal deficit present.     Mental Status: He is alert and oriented to person, place, and time.     Deep Tendon Reflexes: Reflexes normal.  Psychiatric:        Mood and Affect: Mood normal.     ED Results / Procedures / Treatments   Labs (all labs ordered are listed, but only abnormal results are displayed) Results for orders  placed or performed during the hospital encounter of 05/11/22  Resp panel by RT-PCR (RSV, Flu A&B, Covid) Anterior Nasal Swab   Specimen: Anterior Nasal Swab  Result Value Ref Range   SARS Coronavirus 2 by RT PCR NEGATIVE NEGATIVE   Influenza A by PCR NEGATIVE NEGATIVE   Influenza B by PCR NEGATIVE NEGATIVE   Resp Syncytial Virus by PCR NEGATIVE NEGATIVE  CBC with Differential  Result Value Ref Range   WBC 11.8 (H) 4.0 - 10.5  K/uL   RBC 3.40 (L) 4.22 - 5.81 MIL/uL   Hemoglobin 10.6 (L) 13.0 - 17.0 g/dL   HCT 32.9 (L) 39.0 - 52.0 %   MCV 96.8 80.0 - 100.0 fL   MCH 31.2 26.0 - 34.0 pg   MCHC 32.2 30.0 - 36.0 g/dL   RDW 13.6 11.5 - 15.5 %   Platelets 241 150 - 400 K/uL   nRBC 0.0 0.0 - 0.2 %   Neutrophils Relative % 82 %   Neutro Abs 9.7 (H) 1.7 - 7.7 K/uL   Lymphocytes Relative 6 %   Lymphs Abs 0.8 0.7 - 4.0 K/uL   Monocytes Relative 11 %   Monocytes Absolute 1.3 (H) 0.1 - 1.0 K/uL   Eosinophils Relative 0 %   Eosinophils Absolute 0.0 0.0 - 0.5 K/uL   Basophils Relative 0 %   Basophils Absolute 0.0 0.0 - 0.1 K/uL   Immature Granulocytes 1 %   Abs Immature Granulocytes 0.07 0.00 - 0.07 K/uL  Basic metabolic panel  Result Value Ref Range   Sodium 131 (L) 135 - 145 mmol/L   Potassium 4.7 3.5 - 5.1 mmol/L   Chloride 100 98 - 111 mmol/L   CO2 23 22 - 32 mmol/L   Glucose, Bld 143 (H) 70 - 99 mg/dL   BUN 13 8 - 23 mg/dL   Creatinine, Ser 0.70 0.61 - 1.24 mg/dL   Calcium 8.2 (L) 8.9 - 10.3 mg/dL   GFR, Estimated >60 >60 mL/min   Anion gap 8 5 - 15  I-Stat arterial blood gas, ED ([facility], [facility], [facility])  Result Value Ref Range   pH, Arterial 7.367 7.35 - 7.45   pCO2 arterial 35.8 32 - 48 mmHg   pO2, Arterial 62 (L) 83 - 108 mmHg   Bicarbonate 20.6 20.0 - 28.0 mmol/L   TCO2 22 22 - 32 mmol/L   O2 Saturation 91 %   Acid-base deficit 4.0 (H) 0.0 - 2.0 mmol/L   Sodium 132 (L) 135 - 145 mmol/L   Potassium 4.7 3.5 - 5.1 mmol/L   Calcium, Ion 1.20 1.15 - 1.40 mmol/L   HCT 32.0 (L) 39.0  - 52.0 %   Hemoglobin 10.9 (L) 13.0 - 17.0 g/dL   Patient temperature 98.2 F    Collection site RADIAL, ALLEN'S TEST ACCEPTABLE    Drawn by Operator    Sample type ARTERIAL    DG Chest Portable 1 View  Result Date: 05/11/2022 CLINICAL DATA:  Coughing and shortness of breath. EXAM: PORTABLE CHEST 1 VIEW COMPARISON:  CTA chest 04/09/2022, AP and lateral chest 04/18/2022 FINDINGS: 3:05 a.m. There is either a loop recorder device or leadless pacemaker superimposing over the lower left chest. A right chest implanted battery is again noted with a single wire extending up into the right neck. There is a new right PICC which loops up into the IJ vein at the level of the brachiocephalic/SVC junction then continues inferiorly into the upper most SVC. The heart is enlarged. There is mild increased central vascular prominence. Interstitial and patchy airspace opacities are present throughout the lungs, right-greater-than-left. Small pleural effusions are beginning to develop. Findings could be due to edema with asymmetry, pneumonia or combination. The mediastinum is normally outlined. There is aortic atherosclerosis. Thoracic spondylosis and osteopenia. IMPRESSION: 1. New right PICC loops up into the IJ vein at the level of the brachiocephalic/SVC junction then continues inferiorly terminating in the upper most SVC. 2. Cardiomegaly with mild increased central vascular prominence. 3. Interstitial and  patchy airspace opacities throughout the lungs, right-greater-than-left. Findings could be due to edema with asymmetry, pneumonia or combination. 4. Small pleural effusions. 5. Aortic atherosclerosis. 6. Clinical correlation and radiographic follow-up recommended. Electronically Signed   By: Telford Nab M.D.   On: 05/11/2022 03:25   CUP PACEART INCLINIC DEVICE CHECK  Result Date: 04/26/2022 Wound check appointment. Sutures previously removed at Pt's facility. Wound without redness or edema. Incision edges  approximated, wound well healed. Normal MICRA device function. See media for complete interrogation.Myrtie Hawk, BSN, RN  [place] EKG SITE RITE  Result Date: 04/18/2022 If [facility] image not attached, placement could not be confirmed due to current cardiac rhythm.  [place] EKG SITE RITE  Result Date: 04/18/2022 If Site Rite image not attached, placement could not be confirmed due to current cardiac rhythm.  DG Chest 2 View  Result Date: 04/18/2022 CLINICAL DATA:  77 year old male with shortness of breath. "Pacemaker infection". EXAM: CHEST - 2 VIEW COMPARISON:  Portable chest 04/12/2022. FINDINGS: Seated upright AP and lateral views of the chest at 0623 hours. Respiratory motion on the lateral. Left chest dual lead pacemaker has been removed. There is a cylindrical hyperdense object now projecting over the anterior cardiac apex. Stable cardiac size and mediastinal contours. Lower lung volumes. Contralateral right chest generator device with electrical lead coursing up into the neck appears stable. No pneumothorax or pulmonary edema. No consolidation. Difficult to exclude small pleural effusions. No acute osseous abnormality identified. Visible bowel gas within normal limits. IMPRESSION: Dual lead left chest pacemaker removed with small cylindrical hyperdensity now projecting over the anterior cardiac apex. But stable mediastinal contours. Lower lung volumes with mild atelectasis. Difficult to exclude small effusions. Electronically Signed   By: Genevie Ann M.D.   On: 04/18/2022 08:51   EP PPM/ICD IMPLANT  Result Date: 04/17/2022 CONCLUSIONS: 1. 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 Successful implantation of a Medtronic Micra leadless pacemaker. 3. No early apparent complications. 4. Interrupted sutures to be removed in 10-14 days   ECHO TEE  Result Date: 04/14/2022    TRANSESOPHOGEAL ECHO REPORT   Patient Name:   Douglas Perkins. Date of Exam:  04/14/2022 Medical Rec #:  [id number]           Height:       72.0 in Accession #:    [id number]          Weight:       280.9 lb Date of Birth:  February 13, 1946           BSA:          2.461 m Patient Age:    50 years            BP:           135/64 mmHg Patient Gender: M                   HR:           110 bpm. Exam Location:  Inpatient Procedure: Transesophageal Echo and Color Doppler Indications:     Bacteremia  History:         Patient has prior history of Echocardiogram examinations, most                  recent 04/11/2022. Pacemaker and Abnormal ECG,                  Arrythmias:Bradycardia, Signs/Symptoms:Chest Pain and  Bacteremia; Risk Factors:Hypertension.  Sonographer:     Roseanna Rainbow RDCS Referring Phys:  Kirk Ruths, S Diagnosing Phys: Kirk Ruths MD PROCEDURE: After discussion of the risks and benefits of a TEE, an informed consent was obtained from the patient. The transesophogeal probe was passed without difficulty through the esophogus of the patient. Imaged were obtained with the patient in a left lateral decubitus position. Sedation performed by different physician. The patient was monitored while under deep sedation. Anesthestetic sedation was provided intravenously by Anesthesiology: '400mg'$  of Propofol, '100mg'$  of Lidocaine. The patient's vital signs; including heart rate, blood pressure, and oxygen saturation; remained stable throughout the procedure. The patient developed no complications during the procedure.  IMPRESSIONS  1. No valvular vegetations noted; oscillating density right atrial lead consistent with vegetation.  2. Left ventricular ejection fraction, by estimation, is 55 to 60%. The left ventricle has normal function. The left ventricle has no regional wall motion abnormalities.  3. Right ventricular systolic function is normal. The right ventricular size is normal.  4. No left atrial/left atrial appendage thrombus was detected.  5. The mitral valve is normal in  structure. Trivial mitral valve regurgitation.  6. The aortic valve is tricuspid. Aortic valve regurgitation is not visualized. Aortic valve sclerosis is present, with no evidence of aortic valve stenosis.  7. There is Moderate (Grade III) plaque involving the descending aorta.  8. Agitated saline contrast bubble study was negative, with no evidence of any interatrial shunt. FINDINGS  Left Ventricle: Left ventricular ejection fraction, by estimation, is 55 to 60%. The left ventricle has normal function. The left ventricle has no regional wall motion abnormalities. The left ventricular internal cavity size was normal in size. Right Ventricle: The right ventricular size is normal. Right ventricular systolic function is normal. Left Atrium: Left atrial size was normal in size. No left atrial/left atrial appendage thrombus was detected. Right Atrium: Right atrial size was normal in size. Pericardium: There is no evidence of pericardial effusion. Mitral Valve: The mitral valve is normal in structure. Trivial mitral valve regurgitation. Tricuspid Valve: The tricuspid valve is normal in structure. Tricuspid valve regurgitation is trivial. Aortic Valve: The aortic valve is tricuspid. Aortic valve regurgitation is not visualized. Aortic valve sclerosis is present, with no evidence of aortic valve stenosis. Pulmonic Valve: The pulmonic valve was normal in structure. Pulmonic valve regurgitation is not visualized. Aorta: The aortic root is normal in size and structure. There is moderate (Grade III) plaque involving the descending aorta. IAS/Shunts: The interatrial septum is aneurysmal. No atrial level shunt detected by color flow Doppler. Agitated saline contrast bubble study was negative, with no evidence of any interatrial shunt. Additional Comments: No valvular vegetations noted; oscillating density right atrial lead consistent with vegetation. A device lead is visualized. Kirk Ruths MD Electronically signed by Kirk Ruths MD Signature Date/Time: 04/14/2022/8:54:37 AM    Final    VAS [place] LOWER EXTREMITY VENOUS (DVT)  Result Date: 04/13/2022  Lower Venous DVT Study Patient Name:  Douglas Perkins.  Date of Exam:   04/13/2022 Medical Rec #: [id number]            Accession #:    [id number] Date of Birth:  22, 1947            Patient Gender: M Patient Age:   12 years Exam Location:  [facility] Procedure:      VAS [place] LOWER EXTREMITY VENOUS (DVT) Referring Phys: [facility] EZENDUKA --------------------------------------------------------------------------------  Indications: Swelling, and Edema.  Comparison Study:  no prior Performing Technologist: Archie Patten RVS  Examination Guidelines: A complete evaluation includes B-mode imaging, spectral Doppler, color Doppler, and power Doppler as needed of all accessible portions of each vessel. Bilateral testing is considered an integral part of a complete examination. Limited examinations for reoccurring indications may be performed as noted. The reflux portion of the exam is performed with the patient in reverse Trendelenburg.  +---------+---------------+---------+-----------+----------+--------------+ RIGHT    CompressibilityPhasicitySpontaneityPropertiesThrombus Aging +---------+---------------+---------+-----------+----------+--------------+ CFV      Full           Yes      Yes                                 +---------+---------------+---------+-----------+----------+--------------+ SFJ      Full                                                        +---------+---------------+---------+-----------+----------+--------------+ FV Prox  Full                                                        +---------+---------------+---------+-----------+----------+--------------+ FV Mid   Full                                                        +---------+---------------+---------+-----------+----------+--------------+ FV Distal                Yes      Yes                                 +---------+---------------+---------+-----------+----------+--------------+ PFV      Full                                                        +---------+---------------+---------+-----------+----------+--------------+ POP      Full           Yes      Yes                                 +---------+---------------+---------+-----------+----------+--------------+ PTV      Full                                                        +---------+---------------+---------+-----------+----------+--------------+ PERO     Full                                                        +---------+---------------+---------+-----------+----------+--------------+   +---------+---------------+---------+-----------+----------+--------------+  LEFT     CompressibilityPhasicitySpontaneityPropertiesThrombus Aging +---------+---------------+---------+-----------+----------+--------------+ CFV      Full           Yes      Yes                                 +---------+---------------+---------+-----------+----------+--------------+ SFJ      Full                                                        +---------+---------------+---------+-----------+----------+--------------+ FV Prox  Full                                                        +---------+---------------+---------+-----------+----------+--------------+ FV Mid   Full                                                        +---------+---------------+---------+-----------+----------+--------------+ FV DistalFull                                                        +---------+---------------+---------+-----------+----------+--------------+ PFV      Full                                                        +---------+---------------+---------+-----------+----------+--------------+ POP      Full           Yes      Yes                                  +---------+---------------+---------+-----------+----------+--------------+ PTV      Full                                                        +---------+---------------+---------+-----------+----------+--------------+ PERO     Full                                                        +---------+---------------+---------+-----------+----------+--------------+     Summary: BILATERAL: - No evidence of deep vein thrombosis seen in the lower extremities, bilaterally. -No evidence of popliteal cyst, bilaterally.   *See table(s) above for measurements and observations. Electronically signed by Monica Martinez MD on 04/13/2022 at 2:45:29 PM.  Final    DG Chest Port 1 View  Result Date: 04/12/2022 CLINICAL DATA:  Shortness of breath EXAM: PORTABLE CHEST 1 VIEW COMPARISON:  04/09/2022 FINDINGS: Left-sided implanted cardiac device and right-sided nerve stimulator remain in place. Stable cardiomediastinal contours. No focal airspace consolidation, pleural effusion, or pneumothorax. IMPRESSION: Stabled chest radiograph.  No acute cardiopulmonary findings. Electronically Signed   By: Davina Poke D.O.   On: 04/12/2022 08:13   ECHOCARDIOGRAM COMPLETE  Result Date: 04/11/2022    ECHOCARDIOGRAM REPORT   Patient Name:   Douglas Perkins. Date of Exam: 04/11/2022 Medical Rec #:  [id number]           Height:       72.0 in Accession #:    [id number]          Weight:       214.9 lb Date of Birth:  May 26, 1945           BSA:          2.197 m Patient Age:    40 years            BP:           128/63 mmHg Patient Gender: M                   HR:           76 bpm. Exam Location:  Inpatient Procedure: 2D Echo, Cardiac Doppler, Color Doppler and Intracardiac            Opacification Agent Indications:    Bacteremia  History:        Patient has prior history of Echocardiogram examinations, most                 recent 05/22/2017. CAD, Abnormal ECG and Pacemaker,                 Arrythmias:Bradycardia,  Signs/Symptoms:Chest Pain, Bacteremia,                 Syncope and Dyspnea; Risk Factors:Hypertension, Sleep Apnea and                 Dyslipidemia. Cancer.  Sonographer:    Roseanna Rainbow RDCS Referring Phys: [id number] CORNEL[address] N VAN DAM  Sonographer Comments: Technically difficult study due to poor echo windows, suboptimal parasternal window, suboptimal apical window, suboptimal subcostal window and patient is obese. Image acquisition challenging due to patient body habitus. Difficult study, patient moving and uncomfortable. IMPRESSIONS  1. The aortic valve is abnormal; trileaflet aortic valve with mild calcification. In the PLAX, cannot exclude echodensity on the ventricular surfance of the aortic valve. No AI in this region. There is mild calcification of the aortic valve. There is mild thickening of the aortic valve. Aortic valve regurgitation is not visualized.  2. Left ventricular ejection fraction, by estimation, is 60 to 65%. The left ventricle has normal function. The left ventricle has no regional wall motion abnormalities. There is moderate concentric left ventricular hypertrophy. Left ventricular diastolic parameters are indeterminate.  3. Right ventricular systolic function is hyperdynamic. The right ventricular size is normal. Tricuspid regurgitation signal is inadequate for assessing PA pressure.  4. The mitral valve is normal in structure. No evidence of mitral valve regurgitation. No evidence of mitral stenosis.  5. The inferior vena cava is normal in size with greater than 50% respiratory variability, suggesting right atrial pressure of 3 mmHg. Comparison(s): No prior Echocardiogram. Conclusion(s)/Recommendation(s): In the setting of bacteremia, consider TEE.  FINDINGS  Left Ventricle: Left ventricular ejection fraction, by estimation, is 60 to 65%. The left ventricle has normal function. The left ventricle has no regional wall motion abnormalities. Definity contrast agent was given IV to delineate the left  ventricular  endocardial borders. The left ventricular internal cavity size was normal in size. There is moderate concentric left ventricular hypertrophy. Left ventricular diastolic parameters are indeterminate. Right Ventricle: The right ventricular size is normal. No increase in right ventricular wall thickness. Right ventricular systolic function is hyperdynamic. Tricuspid regurgitation signal is inadequate for assessing PA pressure. Left Atrium: Left atrial size was normal in size. Right Atrium: Right atrial size was normal in size. Pericardium: There is no evidence of pericardial effusion. Mitral Valve: The mitral valve is normal in structure. No evidence of mitral valve regurgitation. No evidence of mitral valve stenosis. Tricuspid Valve: The tricuspid valve is normal in structure. Tricuspid valve regurgitation is not demonstrated. No evidence of tricuspid stenosis. Aortic Valve: The aortic valve is abnormal. There is mild calcification of the aortic valve. There is mild thickening of the aortic valve. Aortic valve regurgitation is not visualized. Pulmonic Valve: The pulmonic valve was not well visualized. Pulmonic valve regurgitation is trivial. Aorta: The aortic root and ascending aorta are structurally normal, with no evidence of dilitation. Venous: The inferior vena cava is normal in size with greater than 50% respiratory variability, suggesting right atrial pressure of 3 mmHg. IAS/Shunts: No atrial level shunt detected by color flow Doppler.  LEFT VENTRICLE PLAX 2D LVIDd:         5.40 cm LVIDs:         3.70 cm LV PW:         1.30 cm LV IVS:        1.20 cm LVOT diam:     2.50 cm LV SV:         67 LV SV Index:   31 LVOT Area:     4.91 cm  LV Volumes (MOD) LV vol d, MOD A2C: 217.0 ml LV vol d, MOD A4C: 171.0 ml LV vol s, MOD A2C: 88.8 ml LV vol s, MOD A4C: 73.2 ml LV SV MOD A2C:     128.2 ml LV SV MOD A4C:     171.0 ml LV SV MOD BP:      114.3 ml RIGHT VENTRICLE             IVC RV S prime:     12.60 cm/s   IVC diam: 1.90 cm TAPSE (M-mode): 2.8 cm LEFT ATRIUM           Index        RIGHT ATRIUM           Index LA diam:      3.70 cm 1.68 cm/m   RA Area:     16.00 cm LA Vol (A2C): 37.8 ml 17.21 ml/m  RA Volume:   40.10 ml  18.25 ml/m LA Vol (A4C): 31.2 ml 14.20 ml/m  AORTIC VALVE LVOT Vmax:   100.53 cm/s LVOT Vmean:  66.033 cm/s LVOT VTI:    0.137 m  AORTA Ao Root diam: 3.60 cm Ao Asc diam:  3.20 cm MITRAL VALVE MV Area (PHT): 4.76 cm     SHUNTS MV Decel Time: 159 msec     Systemic VTI:  0.14 m MV E velocity: 119.33 cm/s  Systemic Diam: 2.50 cm Rudean Haskell MD Electronically signed by Rudean Haskell MD Signature Date/Time: 04/11/2022/10:48:29 AM    Final  EKG EKG Interpretation  Date/Time:  Thursday May 11 2022 03:04:49 EST Ventricular Rate:  78 PR Interval:    QRS Duration: 201 QT Interval:  455 QTC Calculation: 519 R Axis:   -56 Text Interpretation: Left bundle branch block Confirmed by Randal Buba, Sayyid Harewood ([id number]) on 05/11/2022 3:46:05 AM  Radiology DG Chest Portable 1 View  Result Date: 05/11/2022 CLINICAL DATA:  Coughing and shortness of breath. EXAM: PORTABLE CHEST 1 VIEW COMPARISON:  CTA chest 04/09/2022, AP and lateral chest 04/18/2022 FINDINGS: 3:05 a.m. There is either a loop recorder device or leadless pacemaker superimposing over the lower left chest. A right chest implanted battery is again noted with a single wire extending up into the right neck. There is a new right PICC which loops up into the IJ vein at the level of the brachiocephalic/SVC junction then continues inferiorly into the upper most SVC. The heart is enlarged. There is mild increased central vascular prominence. Interstitial and patchy airspace opacities are present throughout the lungs, right-greater-than-left. Small pleural effusions are beginning to develop. Findings could be due to edema with asymmetry, pneumonia or combination. The mediastinum is normally outlined. There is aortic atherosclerosis.  Thoracic spondylosis and osteopenia. IMPRESSION: 1. New right PICC loops up into the IJ vein at the level of the brachiocephalic/SVC junction then continues inferiorly terminating in the upper most SVC. 2. Cardiomegaly with mild increased central vascular prominence. 3. Interstitial and patchy airspace opacities throughout the lungs, right-greater-than-left. Findings could be due to edema with asymmetry, pneumonia or combination. 4. Small pleural effusions. 5. Aortic atherosclerosis. 6. Clinical correlation and radiographic follow-up recommended. Electronically Signed   By: Telford Nab M.D.   On: 05/11/2022 03:25    Procedures Procedures    Medications Ordered in ED Medications  vancomycin (VANCOCIN) IVPB 1000 mg/200 mL premix (1,000 mg Intravenous New Bag/Given 05/11/22 0419)  ceFEPIme (MAXIPIME) 2 g in sodium chloride 0.9 % 100 mL IVPB (2 g Intravenous New Bag/Given 05/11/22 0413)  albuterol (PROVENTIL) (2.5 MG/3ML) 0.083% nebulizer solution 5 mg (5 mg Nebulization Given 05/11/22 0314)  furosemide (LASIX) injection 40 mg (40 mg Intravenous Given 05/11/22 0405)    ED Course/ Medical Decision Making/ A&P                           Medical Decision Making Shortness of breath with increased oxygen requirement from 3-5 liters at nursing home   Amount and/or Complexity of Data Reviewed Independent Historian: EMS    Details: See above  External Data Reviewed: labs and notes.    Details: Infectious disease and most recent notes and labs reviewed  Labs: ordered.    Details: All labs reviewed: negative covid and flu and rsv.  White count elevated 11.8, low hemoglobin 10.6, normal platelet count.  Normal sodium 131, normal potassium 4.7, normal creatinine. 7.  Slightly acidotic on ABG    Radiology: ordered and independent interpretation performed.    Details: CHF and PNA by me  ECG/medicine tests: ordered and independent interpretation performed. Decision-making details documented in ED  Course. Discussion of management or test interpretation with external provider(s): Dr. Nevada Crane who will admit the patient   Risk Prescription drug management. Decision regarding hospitalization. Risk Details: Nebulizer given with increased coughing or both green and pink frothy sputum.  BIPAP initiated.    Critical Care Total time providing critical care: 60 minutes (Bipap and nebulizer treatment and reviewed of notes and consult )   CRITICAL CARE Performed by: William Schake K  Kynlei Piontek-Rasch Total critical care time: 60 minutes Critical care time was exclusive of separately billable procedures and treating other patients. Critical care was necessary to treat or prevent imminent or life-threatening deterioration. Critical care was time spent personally by me on the following activities: development of treatment plan with patient and/or surrogate as well as nursing, discussions with consultants, evaluation of patient's response to treatment, examination of patient, obtaining history from patient or surrogate, ordering and performing treatments and interventions, ordering and review of laboratory studies, ordering and review of radiographic studies, pulse oximetry and re-evaluation of patient's condition.  Final Clinical Impression(s) / ED Diagnoses Final diagnoses:  Congestive heart failure, unspecified HF chronicity, unspecified heart failure type [facility])   The patient appears reasonably stabilized for admission considering the current resources, flow, and capabilities available in the ED at this time, and I doubt any other [facility] requiring further screening and/or treatment in the ED prior to admission.     Lucillia Corson, MD 05/11/22 [phone]

## 2022-05-11 NOTE — ED Notes (Signed)
RT called to come look at pt breathing and potentially place the pt back on his Bipap   pt states he is fine however he appears to have we changed the sheets and moved him up in the bed minimal work was done by the pt pt was saturations dropped to 87% placed on 5L and coached on breathing pt was able to get to 91% RT passing off in shift report so someone can come ass

## 2022-05-12 ENCOUNTER — Encounter (HOSPITAL_COMMUNITY): Payer: Self-pay | Admitting: Internal Medicine

## 2022-05-12 ENCOUNTER — Inpatient Hospital Stay (HOSPITAL_COMMUNITY): Payer: Medicare Other

## 2022-05-12 DIAGNOSIS — R7881 Bacteremia: Secondary | ICD-10-CM | POA: Diagnosis not present

## 2022-05-12 DIAGNOSIS — I38 Endocarditis, valve unspecified: Secondary | ICD-10-CM | POA: Diagnosis not present

## 2022-05-12 DIAGNOSIS — T827XXD Infection and inflammatory reaction due to other cardiac and vascular devices, implants and grafts, subsequent encounter: Secondary | ICD-10-CM

## 2022-05-12 DIAGNOSIS — J9601 Acute respiratory failure with hypoxia: Secondary | ICD-10-CM | POA: Diagnosis not present

## 2022-05-12 DIAGNOSIS — F319 Bipolar disorder, unspecified: Secondary | ICD-10-CM | POA: Diagnosis not present

## 2022-05-12 DIAGNOSIS — I5033 Acute on chronic diastolic (congestive) heart failure: Secondary | ICD-10-CM | POA: Diagnosis not present

## 2022-05-12 DIAGNOSIS — J189 Pneumonia, unspecified organism: Secondary | ICD-10-CM | POA: Diagnosis not present

## 2022-05-12 LAB — RESPIRATORY PANEL BY PCR

## 2022-05-12 LAB — CBC
HCT: 30.7 % — ABNORMAL LOW (ref 39.0–52.0)
Hemoglobin: 10.5 g/dL — ABNORMAL LOW (ref 13.0–17.0)
MCH: 31.5 pg (ref 26.0–34.0)
MCHC: 34.2 g/dL (ref 30.0–36.0)
MCV: 92.2 fL (ref 80.0–100.0)
Platelets: 248 10*3/uL (ref 150–400)
RBC: 3.33 MIL/uL — ABNORMAL LOW (ref 4.22–5.81)
RDW: 13.7 % (ref 11.5–15.5)
WBC: 12.5 10*3/uL — ABNORMAL HIGH (ref 4.0–10.5)
nRBC: 0 % (ref 0.0–0.2)

## 2022-05-12 LAB — BASIC METABOLIC PANEL
Anion gap: 13 (ref 5–15)
BUN: 14 mg/dL (ref 8–23)
CO2: 23 mmol/L (ref 22–32)
Calcium: 7.9 mg/dL — ABNORMAL LOW (ref 8.9–10.3)
Chloride: 99 mmol/L (ref 98–111)
Creatinine, Ser: 0.7 mg/dL (ref 0.61–1.24)
GFR, Estimated: >60 mL/min (ref >60)
Glucose, Bld: 120 mg/dL — ABNORMAL HIGH (ref 70–99)
Potassium: 3.5 mmol/L (ref 3.5–5.1)
Sodium: 135 mmol/L (ref 135–145)

## 2022-05-12 LAB — ECHOCARDIOGRAM LIMITED
Height: 72 in
Weight: 4096 oz

## 2022-05-12 MED ORDER — PANTOPRAZOLE SODIUM 40 MG PO TBEC
40.0000 mg | DELAYED_RELEASE_TABLET | Freq: Two times a day (BID) | ORAL | Status: DC
  Administered 2022-05-12 – 2022-05-18 (×12): 40 mg via ORAL
  Filled 2022-05-12 (×15): qty 1

## 2022-05-12 MED ORDER — TIMOLOL MALEATE 0.5 % OP SOLN
1.0000 [drp] | Freq: Two times a day (BID) | OPHTHALMIC | Status: DC
  Administered 2022-05-12 – 2022-05-18 (×13): 1 [drp] via OPHTHALMIC
  Filled 2022-05-12: qty 5

## 2022-05-12 MED ORDER — ASPIRIN 81 MG PO TBEC
81.0000 mg | DELAYED_RELEASE_TABLET | Freq: Every day | ORAL | Status: DC
  Administered 2022-05-12 – 2022-05-18 (×7): 81 mg via ORAL
  Filled 2022-05-12 (×7): qty 1

## 2022-05-12 MED ORDER — GABAPENTIN 300 MG PO CAPS
300.0000 mg | ORAL_CAPSULE | Freq: Two times a day (BID) | ORAL | Status: DC
  Administered 2022-05-12 – 2022-05-18 (×13): 300 mg via ORAL
  Filled 2022-05-12 (×13): qty 1

## 2022-05-12 MED ORDER — CARVEDILOL PHOSPHATE ER 20 MG PO CP24
20.0000 mg | ORAL_CAPSULE | Freq: Every day | ORAL | Status: DC
  Administered 2022-05-13 – 2022-05-17 (×5): 20 mg via ORAL
  Filled 2022-05-12 (×6): qty 1

## 2022-05-12 MED ORDER — QUETIAPINE FUMARATE 100 MG PO TABS
300.0000 mg | ORAL_TABLET | Freq: Every day | ORAL | Status: DC
  Administered 2022-05-12 – 2022-05-17 (×6): 300 mg via ORAL
  Filled 2022-05-12 (×6): qty 3

## 2022-05-12 MED ORDER — ATORVASTATIN CALCIUM 40 MG PO TABS
40.0000 mg | ORAL_TABLET | Freq: Every day | ORAL | Status: DC
  Administered 2022-05-12 – 2022-05-18 (×7): 40 mg via ORAL
  Filled 2022-05-12 (×7): qty 1

## 2022-05-12 MED ORDER — LATANOPROST 0.005 % OP SOLN
1.0000 [drp] | Freq: Two times a day (BID) | OPHTHALMIC | Status: DC
  Administered 2022-05-12 – 2022-05-18 (×13): 1 [drp] via OPHTHALMIC
  Filled 2022-05-12: qty 2.5

## 2022-05-12 MED ORDER — TAMSULOSIN HCL 0.4 MG PO CAPS
0.4000 mg | ORAL_CAPSULE | Freq: Every day | ORAL | Status: DC
  Administered 2022-05-12 – 2022-05-17 (×6): 0.4 mg via ORAL
  Filled 2022-05-12 (×6): qty 1

## 2022-05-12 MED ORDER — MELATONIN 5 MG PO TABS
10.0000 mg | ORAL_TABLET | Freq: Every day | ORAL | Status: DC
  Administered 2022-05-12 – 2022-05-17 (×6): 10 mg via ORAL
  Filled 2022-05-12 (×6): qty 2

## 2022-05-12 MED ORDER — MOMETASONE FURO-FORMOTEROL FUM 100-5 MCG/ACT IN AERO
2.0000 | INHALATION_SPRAY | Freq: Two times a day (BID) | RESPIRATORY_TRACT | Status: DC
  Administered 2022-05-12 – 2022-05-18 (×12): 2 via RESPIRATORY_TRACT
  Filled 2022-05-12: qty 8.8

## 2022-05-12 MED ORDER — DIVALPROEX SODIUM 500 MG PO DR TAB
1000.0000 mg | DELAYED_RELEASE_TABLET | Freq: Every day | ORAL | Status: DC
  Administered 2022-05-12 – 2022-05-17 (×6): 1000 mg via ORAL
  Filled 2022-05-12 (×6): qty 2

## 2022-05-12 MED ORDER — DORZOLAMIDE HCL 2 % OP SOLN
1.0000 [drp] | Freq: Two times a day (BID) | OPHTHALMIC | Status: DC
  Administered 2022-05-12 – 2022-05-18 (×13): 1 [drp] via OPHTHALMIC
  Filled 2022-05-12: qty 10

## 2022-05-12 MED ORDER — PANTOPRAZOLE SODIUM 40 MG PO TBEC: 40.0000 mg | DELAYED_RELEASE_TABLET | Freq: Two times a day (BID) | ORAL | Status: DC

## 2022-05-12 MED ORDER — CHLORHEXIDINE GLUCONATE CLOTH 2 % EX PADS
6.0000 | MEDICATED_PAD | Freq: Every day | CUTANEOUS | Status: DC
  Administered 2022-05-12 – 2022-05-18 (×7): 6 via TOPICAL

## 2022-05-12 NOTE — Evaluation (Addendum)
Physical Therapy Evaluation Patient Details Name: Douglas Perkins. MRN: [id number] DOB: 10-18-1945 Today's Date: 05/12/2022  History of Present Illness  Pt is a 77 y/o M admitted on 1/11 for SOB. 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 Pt on 3L [place] at baseline. 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  Clinical Impression  Pt admitted with above diagnosis. Pt presenting today with impaired mobility, limited by strength, balance, and endurance. Pt able to perform bed mobility and side scooting with minA today and use of siderails with HOB elevated. Pt able to sit EOB ~5 minutes with increased WOB and SPO2 ~89%. Attempted to transfer or perform sit>stand, however pt adamantly declining for fear of falling, pt with a history of falls. Pt reports he last stood ~4 days ago with assistance, no ambulation. With scooting EOB and sit>supine transfer, pt desatting to ~86% but recovers with supine rest break and cueing for deep breathing. Pt will continue to benefit from skilled acute PT at this time to progress mobility, strength, balance, and endurance. Recommend discharge to SNF for continuation of therapy with full time care.          Recommendations for follow up therapy are one component of a multi-disciplinary discharge planning process, led by the attending physician.  Recommendations may be updated based on patient status, additional functional criteria and insurance authorization.  Follow Up Recommendations Skilled nursing-short term rehab (<3 hours/day) Can patient physically be transported by private vehicle: No    Assistance Recommended at Discharge Frequent or constant Supervision/Assistance  Patient can return home with the following  A lot of help with walking and/or transfers;A lot of help with bathing/dressing/bathroom;Assistance with  cooking/housework;Direct supervision/assist for medications management;Direct supervision/assist for financial management;Assist for transportation;Help with stairs or ramp for entrance    Equipment Recommendations Other (comment) (deferred to next level of care)  Recommendations for Other Services  OT consult    Functional Status Assessment Patient has had a recent decline in their functional status and demonstrates the ability to make significant improvements in function in a reasonable and predictable amount of time.     Precautions / Restrictions Precautions Precautions: Fall;ICD/Pacemaker Precaution Comments: recent extraction of PPM and replacement with leadless PPM on 12/18 Restrictions Weight Bearing Restrictions: No      Mobility  Bed Mobility Overal bed mobility: Needs Assistance Bed Mobility: Supine to Sit, Sit to Supine     Supine to sit: Min assist, HOB elevated Sit to supine: Min assist, HOB elevated   General bed mobility comments: minA required for pulling self up with use of handrails and HOB elevated for supine>sit. Pt with minA for sit>supine for BLE management.    Transfers                   General transfer comment: Pt declining any transfer or sit<>stand attempts for fear of falling. Pt provided education and reassurance but continuing to decline at this time. Pt able to side scoot along EOB x3 with minA today.    Ambulation/Gait                  Stairs            Wheelchair Mobility    Modified Rankin (Stroke Patients Only)       Balance Overall balance assessment: Needs assistance Sitting-balance support: Bilateral upper extremity supported, Feet supported  Sitting balance-Leahy Scale: Fair Sitting balance - Comments: no sway or LOB noted with static sitting, minA for side scooting                                     Pertinent Vitals/Pain Pain Assessment Pain Assessment: 0-10 Pain Score: 5  Pain  Location: all over- pt reports "normal arthritis" Pain Descriptors / Indicators: Aching Pain Intervention(s): Monitored during session    Home Living Family/patient expects to be discharged to:: [place]: Rollator (4 wheels);Shower seat - built in (previous note reports adjustable HOB,  pt denying at this time)      Prior Function Prior Level of Function : Needs assist       Physical Assist : Mobility (physical) Mobility (physical): Bed mobility;Transfers;Gait;Stairs   Mobility Comments: pt resides at an ALF and was receiving PT 3x/week, recently discharged to SNF where he was receiving assistance with all mobility. Pt reports he last stood 4 days ago with assistance.       Hand Dominance        Extremity/Trunk Assessment   Upper Extremity Assessment Upper Extremity Assessment: Defer to OT evaluation    Lower Extremity Assessment Lower Extremity Assessment: Generalized weakness (knee extension grossly 3-/5, requires use of UE for hip flexion in sitting, declining attempting without. ROM grossly WFL)    Cervical / Trunk Assessment Cervical / Trunk Assessment: Kyphotic  Communication   Communication: HOH  Cognition Arousal/Alertness: Awake/alert Behavior During Therapy: Anxious Overall Cognitive Status: Within Functional Limits for tasks assessed                                 General Comments: pt pleasant but becomes anxious/irritable with attempts to stand or transfer 2/2 fear of falling        General Comments      Exercises     Assessment/Plan    PT Assessment Patient needs continued PT services  PT Problem List Decreased strength;Decreased activity tolerance;Decreased balance;Cardiopulmonary status limiting activity       PT Treatment Interventions DME instruction;Balance training;Gait training;Neuromuscular re-education;Functional mobility training;Patient/family education;Therapeutic  activities;Therapeutic exercise;Wheelchair mobility training;Manual techniques    PT Goals (Current goals can be found in the Care Plan section)  Acute Rehab PT Goals Patient Stated Goal: to get better PT Goal Formulation: With patient Time For Goal Achievement: 05/26/22 Potential to Achieve Goals: Fair    Frequency Min 2X/week     Co-evaluation               AM-PAC PT "6 Clicks" Mobility  Outcome Measure Help needed turning from your back to your side while in a flat bed without using bedrails?: A Little Help needed moving from lying on your back to sitting on the side of a flat bed without using bedrails?: A Little Help needed moving to and from a bed to a chair (including a wheelchair)?: A Lot Help needed standing up from a chair using your arms (e.g., wheelchair or bedside chair)?: A Lot Help needed to walk in hospital room?: A Lot Help needed climbing 3-5 steps with a railing? : Total 6 Click Score: 13    End of Session Equipment Utilized During Treatment: Oxygen Activity Tolerance: Patient limited by fatigue Patient left: in  bed;with call bell/phone within reach Nurse Communication: Mobility status PT Visit Diagnosis: Other abnormalities of gait and mobility (R26.89);Muscle weakness (generalized) (M62.81)    Time: 5053-9767 PT Time Calculation (min) (ACUTE ONLY): 16 min   Charges:   PT Evaluation $PT Eval Low Complexity: 1 Low          Charlynne Cousins, PT DPT Acute Rehabilitation Services Office [phone]   Luvenia Heller 05/12/2022, 12:17 PM

## 2022-05-12 NOTE — Progress Notes (Addendum)
PROGRESS NOTE        PATIENT DETAILS Name: Douglas Perkins. Age: 77 y.o. Sex: male Date of Birth: December 14, 1945 Admit Date: 05/11/2022 Admitting Physician Kayleen Memos, DO PCP:Pcp, No  Brief Summary: Patient is a 77 y.o.  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  Significant events: 12/10-12/22>> hospitalization for group B strep bacteremia with pacemaker lead implantation.  Discharged on IV penicillin-end date 1/29. 01/11>> admit to [facility]-hypoxia due to PNA-initially on BiPAP.  Liberated off BiPAP post admission 01/12>> on around 8-9 L of HFNC.  Significant studies: 01/11>> CXR: Right> left infiltrates  Significant microbiology data: 1/11>> COVID/influenza/RSV PCR: Negative 1/11>> blood culture: No growth 1/12>> respiratory virus panel: Pending  Procedures: 12/15>> TEE: EF 55-60%-no valvular vegetation visualized 12/18>> permanent pacemaker lead explantation-implantation of leadless pacemaker.  Consults: ID  Subjective: Liberated off BiPAP last night-stable on 8-9 L of HFNC.  Not in any acute distress.  Objective: Vitals: Blood pressure 100/71, pulse 77, temperature 98 F (36.7 C), temperature source Oral, resp. rate (!) 22, height 6' (1.829 m), weight 116.1 kg, SpO2 90 %.   Exam: Gen Exam:Alert awake-not in any distress HEENT:atraumatic, normocephalic Chest: Bibasilar rales. CVS:S1S2 regular Abdomen:soft non tender, non distended Extremities: Chronic right leg edema-right leg is atrophic at baseline.  Trace left leg edema. Neurology: Non focal Skin: no rash  Pertinent Labs/Radiology:    Latest Ref Rng & Units 05/12/2022    6:04 AM  05/11/2022    4:10 AM 05/11/2022    3:01 AM  CBC  WBC 4.0 - 10.5 K/uL 12.5   11.8   Hemoglobin 13.0 - 17.0 g/dL 10.5  10.9  10.6   Hematocrit 39.0 - 52.0 % 30.7  32.0  32.9   Platelets 150 - 400 K/uL 248   241     Lab Results  Component Value Date   NA 135 05/12/2022   K 3.5 05/12/2022   CL 99 05/12/2022   CO2 23 05/12/2022      Assessment/Plan: Acute hypoxic respiratory failure due to combination of multifocal PNA and HFpEF exacerbation Hypoxia improving-initially on BiPAP Currently on 8-9 L of HFNC-continue to attempt to titrate down FiO2 Continue antibiotics/diuretics.  Multifocal PNA Continue vancomycin/cefepime Follow cultures Appreciate ID input-if MRSA PCR negative-will discontinue vancomycin. Obtaining updated TTE to ensure no obvious vegetation given recent history of group B strep bacteremia. SLP eval to ensure no silent aspiration.  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 Was on IV penicillin V through 1/29.  Currently on vancomycin/PM to cover PNA-and penicillin V on hold.  History of CAD No angina-no wall motion abnormality on recent echo Continue aspirin/statin/Coreg  History of symptomatic sinus node dysfunction Now with leadless pacemaker in place. 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   Weight as of this encounter: 116.1 kg.   Code status:   Code Status: Full Code   DVT Prophylaxis: Prophylactic Lovenox   Family Communication: None at bedside   Disposition Plan: Status is: Inpatient Remains inpatient appropriate because: Severity of illness   Planned Discharge Destination:Skilled nursing facility   Diet: Diet Order             Diet Heart Room service appropriate? Yes; Fluid consistency: Thin; Fluid restriction: 1500 mL Fluid  Diet effective now                     Antimicrobial agents: Anti-infectives (From admission, onward)    Start     Dose/Rate Route Frequency Ordered Stop   05/11/22 1430  vancomycin (VANCOREADY) IVPB 1250 mg/250 mL        1,250 mg 166.7 mL/hr over 90 Minutes Intravenous Every 12 hours 05/11/22 0825     05/11/22 1000  ceFEPIme (MAXIPIME) 2 g in sodium chloride 0.9 % 100 mL IVPB        2 g 200 mL/hr over 30 Minutes Intravenous Every 8 hours 05/11/22 0737     05/11/22 0330  vancomycin (VANCOCIN) IVPB 1000 mg/200 mL premix        1,000 mg 200 mL/hr over 60 Minutes Intravenous  Once 05/11/22 0329 05/11/22 0528   05/11/22 0330  ceFEPIme (MAXIPIME) 2 g in sodium chloride 0.9 % 100 mL IVPB        2 g 200 mL/hr over 30 Minutes Intravenous STAT 05/11/22 0329 05/11/22 0528        MEDICATIONS: Scheduled Meds:  Chlorhexidine Gluconate Cloth  6 each Topical Daily   enoxaparin (LOVENOX) injection  50 mg Subcutaneous Q24H   furosemide  40 mg Intravenous BID   pantoprazole (PROTONIX) IV  40 mg Intravenous Q24H   sodium chloride flush  3 mL Intravenous Q12H   Continuous Infusions:  ceFEPime (MAXIPIME) IV 2 g (05/12/22 0630)   vancomycin 1,250 mg (05/12/22 0430)   PRN Meds:.acetaminophen **OR** acetaminophen, albuterol, hydrALAZINE   I have personally reviewed following labs and imaging studies  LABORATORY DATA: CBC: Recent Labs   Lab 05/11/22 0301 05/11/22 0410 05/12/22 0604  WBC 11.8*  --  12.5*  NEUTROABS 9.7*  --   --   HGB 10.6* 10.9* 10.5*  HCT 32.9* 32.0* 30.7*  MCV 96.8  --  92.2  PLT 241  --  409    Basic Metabolic Panel: Recent Labs  Lab 05/11/22 0301 05/11/22 0410 05/12/22 0604  NA 131* 132* 135  K 4.7 4.7 3.5  CL 100  --  99  CO2 23  --  23  GLUCOSE 143*  --  120*  BUN 13  --  14  CREATININE 0.70  --  0.70  CALCIUM 8.2*  --  7.9*    GFR: Estimated Creatinine Clearance: 100.1 mL/min (by C-G formula based on SCr of 0.7 mg/dL).  Liver Function Tests: No results for input(s): "AST", "ALT", "ALKPHOS", "BILITOT", "PROT", "ALBUMIN" in the last 168 hours. No results for input(s): "LIPASE", "AMYLASE" in the last 168 hours. No results for input(s): "AMMONIA" in the last 168 hours.  Coagulation Profile: No results for input(s): "INR", "PROTIME"  in the last 168 hours.  Cardiac Enzymes: No results for input(s): "CKTOTAL", "CKMB", "CKMBINDEX", "TROPONINI" in the last 168 hours.  BNP (last 3 results) No results for input(s): "PROBNP" in the last 8760 hours.  Lipid Profile: No results for input(s): "CHOL", "HDL", "LDLCALC", "TRIG", "CHOLHDL", "LDLDIRECT" in the last 72 hours.  Thyroid Function Tests: No results for input(s): "TSH", "T4TOTAL", "FREET4", "T3FREE", "THYROIDAB" in the last 72 hours.  Anemia Panel: No results for input(s): "VITAMINB12", "FOLATE", "FERRITIN", "TIBC", "IRON", "RETICCTPCT" in the last 72 hours.  Urine analysis:    Component Value Date/Time   COLORURINE YELLOW 04/09/2022 2135   APPEARANCEUR HAZY (A) 04/09/2022 2135   APPEARANCEUR Clear 05/24/2015 1328   LABSPEC 1.017 04/09/2022 2135   PHURINE 6.0 04/09/2022 2135   GLUCOSEU NEGATIVE 04/09/2022 2135   HGBUR SMALL (A) 04/09/2022 2135   BILIRUBINUR NEGATIVE 04/09/2022 2135   BILIRUBINUR Negative 05/24/2015 [place] 04/09/2022 2135   PROTEINUR 30 (A) 04/09/2022 2135   NITRITE NEGATIVE  04/09/2022 2135   LEUKOCYTESUR NEGATIVE 04/09/2022 2135    Sepsis Labs: Lactic Acid, Venous    Component Value Date/Time   LATICACIDVEN 0.8 04/11/2022 0755    MICROBIOLOGY: Recent Results (from the past 240 hour(s))  Resp panel by RT-PCR (RSV, Flu A&B, Covid) Anterior Nasal Swab     Status: None   Collection Time: 05/11/22  3:01 AM   Specimen: Anterior Nasal Swab  Result Value Ref Range Status   SARS Coronavirus 2 by RT PCR NEGATIVE NEGATIVE Final    Comment: (NOTE) SARS-CoV-2 target nucleic acids are NOT DETECTED.  The SARS-CoV-2 RNA is generally detectable in upper respiratory specimens during the acute phase of infection. The lowest concentration of SARS-CoV-2 viral copies this assay can detect is 138 copies/mL. A negative result does not preclude SARS-Cov-2 infection and should not be used as the sole basis for treatment or other patient management decisions. A negative result may occur with  improper specimen collection/handling, submission of specimen other than nasopharyngeal swab, presence of viral mutation(s) within the areas targeted by this assay, and inadequate number of viral copies(<138 copies/mL). A negative result must be combined with clinical observations, patient history, and epidemiological information. The expected result is Negative.  Fact Sheet for Patients:  EntrepreneurPulse.com.au  Fact Sheet for Healthcare Providers:  IncredibleEmployment.be  This test is no t yet approved or cleared by the [place] FDA and  has been authorized for detection and/or diagnosis of SARS-CoV-2 by FDA under an Emergency Use Authorization (EUA). This EUA will remain  in effect (meaning this test can be used) for the duration of the COVID-19 declaration under Section 564(b)(1) of the Act, 21 U.S.C.section 360bbb-3(b)(1), unless the authorization is terminated  or revoked sooner.       Influenza A by PCR NEGATIVE NEGATIVE  Final   Influenza B by PCR NEGATIVE NEGATIVE Final    Comment: (NOTE) The Xpert Xpress SARS-CoV-2/FLU/RSV plus assay is intended as an aid in the diagnosis of influenza from Nasopharyngeal swab specimens and should not be used as a sole basis for treatment. Nasal washings and aspirates are unacceptable for Xpert Xpress SARS-CoV-2/FLU/RSV testing.  Fact Sheet for Patients: EntrepreneurPulse.com.au  Fact Sheet for Healthcare Providers: IncredibleEmployment.be  This test is not yet approved or cleared by the [place] FDA and has been authorized for detection and/or diagnosis of SARS-CoV-2 by FDA under an Emergency Use Authorization (EUA). This EUA will remain in effect (meaning this test can be used) for the duration of  the COVID-19 declaration under Section 564(b)(1) of the Act, 21 U.S.C. section 360bbb-3(b)(1), unless the authorization is terminated or revoked.     Resp Syncytial Virus by PCR NEGATIVE NEGATIVE Final    Comment: (NOTE) Fact Sheet for Patients: EntrepreneurPulse.com.au  Fact Sheet for Healthcare Providers: IncredibleEmployment.be  This test is not yet approved or cleared by the [place] FDA and has been authorized for detection and/or diagnosis of SARS-CoV-2 by FDA under an Emergency Use Authorization (EUA). This EUA will remain in effect (meaning this test can be used) for the duration of the COVID-19 declaration under Section 564(b)(1) of the Act, 21 U.S.C. section 360bbb-3(b)(1), unless the authorization is terminated or revoked.  Performed at [facility], [place] [address] [place], [place] [address]   Blood culture (routine x 2)     Status: None (Preliminary result)   Collection Time: 05/11/22  3:43 AM   Specimen: BLOOD LEFT HAND  Result Value Ref Range Status   Specimen Description BLOOD LEFT HAND  Final   Special Requests   Final    BOTTLES DRAWN AEROBIC AND  ANAEROBIC Blood Culture results may not be optimal due to an inadequate volume of blood received in culture bottles   Culture   Final    NO GROWTH < 12 HOURS Performed at [facility], [place] [address], [place], [place] [address]    Report Status PENDING  Incomplete    RADIOLOGY STUDIES/RESULTS: DG Chest Portable 1 View  Result Date: 05/11/2022 CLINICAL DATA:  Coughing and shortness of breath. EXAM: PORTABLE CHEST 1 VIEW COMPARISON:  CTA chest 04/09/2022, AP and lateral chest 04/18/2022 FINDINGS: 3:05 a.m. There is either a loop recorder device or leadless pacemaker superimposing over the lower left chest. A right chest implanted battery is again noted with a single wire extending up into the right neck. There is a new right PICC which loops up into the IJ vein at the level of the brachiocephalic/SVC junction then continues inferiorly into the upper most SVC. The heart is enlarged. There is mild increased central vascular prominence. Interstitial and patchy airspace opacities are present throughout the lungs, right-greater-than-left. Small pleural effusions are beginning to develop. Findings could be due to edema with asymmetry, pneumonia or combination. The mediastinum is normally outlined. There is aortic atherosclerosis. Thoracic spondylosis and osteopenia. IMPRESSION: 1. New right PICC loops up into the IJ vein at the level of the brachiocephalic/SVC junction then continues inferiorly terminating in the upper most SVC. 2. Cardiomegaly with mild increased central vascular prominence. 3. Interstitial and patchy airspace opacities throughout the lungs, right-greater-than-left. Findings could be due to edema with asymmetry, pneumonia or combination. 4. Small pleural effusions. 5. Aortic atherosclerosis. 6. Clinical correlation and radiographic follow-up recommended. Electronically Signed   By: Telford Nab M.D.   On: 05/11/2022 03:25     LOS: 1 day   Oren Binet, MD  [facility]    To contact the attending provider between 7A-7P or the covering provider during after hours 7P-7A, please log into the web site [email or web address] and access using universal [place] password for that web site. If you do not have the password, please call the hospital operator.  05/12/2022, 11:20 AM

## 2022-05-12 NOTE — Progress Notes (Signed)
Patient has inspire cpap compliant

## 2022-05-12 NOTE — Progress Notes (Signed)
  Echocardiogram 2D Echocardiogram has been performed.  Douglas Perkins 05/12/2022, 3:08 PM

## 2022-05-12 NOTE — Progress Notes (Signed)
  Transition of Care [facility]) Screening Note   Patient Details  Name: Douglas Perkins. Date of Birth: 1945/11/09   [facility] of Care [facility]) CM/SW Contact:    Benard Halsted, LCSW Phone Number: 05/12/2022, 5:52 PM    Transition of Care Department [facility]) has reviewed patient from [facility]. We will continue to monitor patient advancement through interdisciplinary progression rounds. If new patient transition needs arise, please place a TOC consult.

## 2022-05-12 NOTE — Progress Notes (Signed)
PHARMACY CONSULT NOTE FOR:  OUTPATIENT  PARENTERAL ANTIBIOTIC THERAPY (OPAT)  Indication: GBS bacteremia/ICD infxn (now removed and leadless ICD placed) Regimen: Penicillin 24 million units/day as a continuous infusion End date: 05/29/22  IV antibiotic discharge orders are pended. To discharging provider:  please sign these orders via discharge navigator,  Select New Orders & click on the button choice - Manage This Unsigned Work.    Thank you for allowing pharmacy to be a part of this patient's care.  Alycia Rossetti, PharmD, BCPS Infectious Diseases Clinical Pharmacist 05/15/2022 4:51 PM   **Pharmacist phone directory can now be found on [email or web address] ([email or web address]).  Listed under [place].

## 2022-05-12 NOTE — Plan of Care (Signed)
  Problem: Education: Goal: Knowledge of cardiac device and self-care will improve Outcome: Progressing Goal: Ability to safely manage health related needs after discharge will improve Outcome: Progressing Goal: Individualized Educational Video(s) Outcome: Progressing   Problem: Cardiac: Goal: Ability to achieve and maintain adequate cardiopulmonary perfusion will improve Outcome: Progressing   Problem: Education: Goal: Ability to demonstrate management of disease process will improve Outcome: Progressing Goal: Ability to verbalize understanding of medication therapies will improve Outcome: Progressing Goal: Individualized Educational Video(s) Outcome: Progressing   Problem: Activity: Goal: Capacity to carry out activities will improve Outcome: Progressing   Problem: Cardiac: Goal: Ability to achieve and maintain adequate cardiopulmonary perfusion will improve Outcome: Progressing   Problem: Education: Goal: Knowledge of General Education information will improve Description: Including pain rating scale, medication(s)/side effects and non-pharmacologic comfort measures Outcome: Progressing   Problem: Health Behavior/Discharge Planning: Goal: Ability to manage health-related needs will improve Outcome: Progressing   Problem: Clinical Measurements: Goal: Ability to maintain clinical measurements within normal limits will improve Outcome: Progressing Goal: Will remain free from infection Outcome: Progressing Goal: Diagnostic test results will improve Outcome: Progressing Goal: Respiratory complications will improve Outcome: Progressing Goal: Cardiovascular complication will be avoided Outcome: Progressing   Problem: Activity: Goal: Risk for activity intolerance will decrease Outcome: Progressing   Problem: Nutrition: Goal: Adequate nutrition will be maintained Outcome: Progressing   Problem: Coping: Goal: Level of anxiety will decrease Outcome: Progressing    Problem: Elimination: Goal: Will not experience complications related to bowel motility Outcome: Progressing Goal: Will not experience complications related to urinary retention Outcome: Progressing   Problem: Pain Managment: Goal: General experience of comfort will improve Outcome: Progressing   Problem: Safety: Goal: Ability to remain free from injury will improve Outcome: Progressing   Problem: Skin Integrity: Goal: Risk for impaired skin integrity will decrease Outcome: Progressing

## 2022-05-12 NOTE — Consult Note (Addendum)
Date of Admission:  05/11/2022          Reason for Consult: Multifocal pneumonia    Referring Provider: Royanne Foots. MD   Assessment:  Multifocal pneumonia vs pulmonary edema Pleural effusions Recent PM infection sp extraction OSA with implanted device XRT skin damage LE  Plan:  Check MRSA PCR nares and if negative would consider DC the vancomycin Otherwise continue cefepime and vancomycin for now. Diurese Rx his OSA Repeat 2D echocardiogram Follow-up blood cultures  Partner Dr. Gearldine Shown is on-call this weekend and available for questions.  Principal Problem:   Multifocal pneumonia Active Problems:   Bipolar I disorder ([place])   Hyponatremia   Streptococcal bacteremia   Acute respiratory failure with hypoxia (HCC)   Heart failure with preserved ejection fraction (HCC)   Leukocytosis   Normocytic anemia   Scheduled Meds:  Chlorhexidine Gluconate Cloth  6 each Topical Daily   enoxaparin (LOVENOX) injection  50 mg Subcutaneous Q24H   furosemide  40 mg Intravenous BID   pantoprazole (PROTONIX) IV  40 mg Intravenous Q24H   sodium chloride flush  3 mL Intravenous Q12H   Continuous Infusions:  ceFEPime (MAXIPIME) IV 2 g (05/12/22 0630)   vancomycin 1,250 mg (05/12/22 0430)   PRN Meds:.acetaminophen **OR** acetaminophen, albuterol, hydrALAZINE  HPI: Douglas Perkins. is a 77 y.o. 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  Blood cultures cleared on the 11th and PICC line was placed on the 20th.  He was seen by my partner Dr. Juleen China in follow-up in the clinic.  That was on 10 January at that point in the clinic he was having worsening cough with shortness of breath.  He  is being eval by the doctors facility and physician . Dr. Juleen China recommended COVID flu and RSV testing.  Apparently patient was having worsening cough with white and brown sputum production.  He was sent from [facility] to the ER yesterday.  The ER he was hypoxic with a saturation of 82% on 5 L via nasal cannula.  Respond to albuterol with improvement in his saturations.  He was placed on BiPAP.  For COVID flu and RSV were all negative chest x-ray showed his right PICC line in place as well as interstitial patchy opacities in the lungs right greater than left could be consistent with edema versus pneumonia.  He also has a pleural effusions bilaterally that are small.  Blood cultures were taken from one site and he was started on vancomycin and cefepime.  Continues to feel dyspneic and cough this morning.   Does not appear overtly infected  Will plan on checking MRSA PCR and if negative would DC the vancomycin.  Otherwise in the interim we will continue vancomycin and cefepime.  Will follow blood cultures will obtain a 2D echocardiogram to reassess his heart valves.  Certainly this could be volume overload rather than pneumonia. Procalcitonin was notably negative.  I spent 82 minutes with the patient including than 50% of the time in face to face counseling of the patient guarding his recent pacemaker infection his cough and pneumonia versus pulmonary edema with pleural effusions, personally reviewing two-view chest x-ray along with review of medical records in preparation for the  visit and during the visit and in coordination of his care.      Review of Systems: Review of Systems  Constitutional:  Negative for chills, fever, malaise/fatigue and weight loss.  HENT:  Negative for congestion and sore throat.   Eyes:  Negative for blurred vision and photophobia.  Respiratory:  Positive for cough, sputum production and shortness of breath. Negative for wheezing.   Cardiovascular:   Negative for chest pain, palpitations and leg swelling.  Gastrointestinal:  Negative for abdominal pain, blood in stool, constipation, diarrhea, heartburn, melena, nausea and vomiting.  Genitourinary:  Negative for dysuria, flank pain and hematuria.  Musculoskeletal:  Negative for back pain, falls, joint pain and myalgias.  Skin:  Negative for itching and rash.  Neurological:  Negative for dizziness, focal weakness, loss of consciousness, weakness and headaches.  Endo/Heme/Allergies:  Does not bruise/bleed easily.  Psychiatric/Behavioral:  Negative for depression and suicidal ideas. The patient does not have insomnia.    PICC clean and without evidence of infection.    Past Medical History:  Diagnosis Date   Bipolar 1 disorder ([place])    Cancer ([place])    prostate   Glaucoma    Hypertension    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HCC)    Mural thrombus of cardiac apex    Presence of permanent cardiac pacemaker 2017   SA node dysfunction   Sleep apnea    does not use CPAP   TIA (transient ischemic attack)     Social History   Tobacco Use   Smoking status: Never   Smokeless tobacco: Never  Substance Use Topics   Alcohol use: No    Alcohol/week: 0.0 standard drinks of alcohol   Drug use: No    Family History  Problem Relation Age of Onset   Stroke Father    CAD Paternal Grandmother    CAD Paternal Grandfather    Allergies  Allergen Reactions   Sulfa Antibiotics Hives   Feldene [Piroxicam] Hives    OBJECTIVE: Blood pressure 100/71, pulse 77, temperature 98 F (36.7 C), temperature source Oral, resp. rate (!) 22, height 6' (1.829 m), weight 116.1 kg, SpO2 90 %.  Physical Exam Constitutional:      Appearance: He is well-developed.  HENT:     Head: Normocephalic and atraumatic.  Eyes:     Conjunctiva/sclera: Conjunctivae normal.  Cardiovascular:     Rate and Rhythm: Normal rate and regular rhythm.     Heart sounds: No murmur heard.    No friction rub. No gallop.  Pulmonary:      Effort: Pulmonary effort is normal. No respiratory distress.     Breath sounds: No stridor. Examination of the right-lower field reveals decreased breath sounds. Examination of the left-lower field reveals decreased breath sounds. Decreased breath sounds present. No wheezing or rhonchi.  Abdominal:     General: There is no distension.     Palpations: Abdomen is soft.  Musculoskeletal:        General: No tenderness. Normal range of motion.     Cervical back: Normal range of motion and neck supple.  Skin:    General: Skin is warm and dry.     Coloration: Skin is not pale.     Findings: No erythema or rash.  Neurological:     General: No focal deficit present.     Mental Status: He is alert and oriented to person, place, and time.  Psychiatric:        Mood and Affect: Mood normal.  Behavior: Behavior normal.        Thought Content: Thought content normal.        Judgment: Judgment normal.     Lab Results Lab Results  Component Value Date   WBC 12.5 (H) 05/12/2022   HGB 10.5 (L) 05/12/2022   HCT 30.7 (L) 05/12/2022   MCV 92.2 05/12/2022   PLT 248 05/12/2022    Lab Results  Component Value Date   CREATININE 0.70 05/12/2022   BUN 14 05/12/2022   NA 135 05/12/2022   K 3.5 05/12/2022   CL 99 05/12/2022   CO2 23 05/12/2022    Lab Results  Component Value Date   ALT 26 04/10/2022   AST 56 (H) 04/10/2022   ALKPHOS 52 04/10/2022   BILITOT 0.5 04/10/2022     Microbiology: Recent Results (from the past 240 hour(s))  Resp panel by RT-PCR (RSV, Flu A&B, Covid) Anterior Nasal Swab     Status: None   Collection Time: 05/11/22  3:01 AM   Specimen: Anterior Nasal Swab  Result Value Ref Range Status   SARS Coronavirus 2 by RT PCR NEGATIVE NEGATIVE Final    Comment: (NOTE) SARS-CoV-2 target nucleic acids are NOT DETECTED.  The SARS-CoV-2 RNA is generally detectable in upper respiratory specimens during the acute phase of infection. The lowest concentration of SARS-CoV-2  viral copies this assay can detect is 138 copies/mL. A negative result does not preclude SARS-Cov-2 infection and should not be used as the sole basis for treatment or other patient management decisions. A negative result may occur with  improper specimen collection/handling, submission of specimen other than nasopharyngeal swab, presence of viral mutation(s) within the areas targeted by this assay, and inadequate number of viral copies(<138 copies/mL). A negative result must be combined with clinical observations, patient history, and epidemiological information. The expected result is Negative.  Fact Sheet for Patients:  EntrepreneurPulse.com.au  Fact Sheet for Healthcare Providers:  IncredibleEmployment.be  This test is no t yet approved or cleared by the [place] FDA and  has been authorized for detection and/or diagnosis of SARS-CoV-2 by FDA under an Emergency Use Authorization (EUA). This EUA will remain  in effect (meaning this test can be used) for the duration of the COVID-19 declaration under Section 564(b)(1) of the Act, 21 U.S.C.section 360bbb-3(b)(1), unless the authorization is terminated  or revoked sooner.       Influenza A by PCR NEGATIVE NEGATIVE Final   Influenza B by PCR NEGATIVE NEGATIVE Final    Comment: (NOTE) The Xpert Xpress SARS-CoV-2/FLU/RSV plus assay is intended as an aid in the diagnosis of influenza from Nasopharyngeal swab specimens and should not be used as a sole basis for treatment. Nasal washings and aspirates are unacceptable for Xpert Xpress SARS-CoV-2/FLU/RSV testing.  Fact Sheet for Patients: EntrepreneurPulse.com.au  Fact Sheet for Healthcare Providers: IncredibleEmployment.be  This test is not yet approved or cleared by the [place] FDA and has been authorized for detection and/or diagnosis of SARS-CoV-2 by FDA under an Emergency Use Authorization  (EUA). This EUA will remain in effect (meaning this test can be used) for the duration of the COVID-19 declaration under Section 564(b)(1) of the Act, 21 U.S.C. section 360bbb-3(b)(1), unless the authorization is terminated or revoked.     Resp Syncytial Virus by PCR NEGATIVE NEGATIVE Final    Comment: (NOTE) Fact Sheet for Patients: EntrepreneurPulse.com.au  Fact Sheet for Healthcare Providers: IncredibleEmployment.be  This test is not yet approved or cleared by the [place] FDA and has  been authorized for detection and/or diagnosis of SARS-CoV-2 by FDA under an Emergency Use Authorization (EUA). This EUA will remain in effect (meaning this test can be used) for the duration of the COVID-19 declaration under Section 564(b)(1) of the Act, 21 U.S.C. section 360bbb-3(b)(1), unless the authorization is terminated or revoked.  Performed at [facility], [place] [address] [place], [place] [address]   Blood culture (routine x 2)     Status: None (Preliminary result)   Collection Time: 05/11/22  3:43 AM   Specimen: BLOOD LEFT HAND  Result Value Ref Range Status   Specimen Description BLOOD LEFT HAND  Final   Special Requests   Final    BOTTLES DRAWN AEROBIC AND ANAEROBIC Blood Culture results may not be optimal due to an inadequate volume of blood received in culture bottles   Culture   Final    NO GROWTH < 12 HOURS Performed at [facility], [place] [address], [place], [place] [address]    Report Status PENDING  Incomplete    Alcide Evener, [place] [facility] [phone] pager  05/12/2022, 10:43 AM

## 2022-05-12 NOTE — Evaluation (Signed)
Clinical/Bedside Swallow Evaluation Patient Details  Name: Douglas Perkins. MRN: [id number] Date of Birth: 08/06/1945  Today's Date: 05/12/2022 Time: Perkins Start Time (ACUTE ONLY): 63 Perkins Stop Time (ACUTE ONLY): 1417 Perkins Time Calculation (min) (ACUTE ONLY): 15 min  Past Medical History:  Past Medical History:  Diagnosis Date   Bipolar 1 disorder ([place])    Cancer ([place])    prostate   Glaucoma    Hypertension    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[place])    Mural thrombus of cardiac apex    Presence of permanent cardiac pacemaker 2017   SA node dysfunction   Sleep apnea    does not use CPAP   TIA (transient ischemic attack)    Past Surgical History:  Past Surgical History:  Procedure Laterality Date   BUBBLE STUDY  04/14/2022   Procedure: BUBBLE STUDY;  Surgeon: Lelon Perla, MD;  Location: [place];  Service: Cardiovascular;;   CHOLECYSTECTOMY     COLONOSCOPY WITH PROPOFOL N/A 01/31/2017   Procedure: COLONOSCOPY WITH PROPOFOL;  Surgeon: Manya Silvas, MD;  Location: [facility] ENDOSCOPY;  Service: Endoscopy;  Laterality: N/A;   DRUG INDUCED ENDOSCOPY N/A 05/12/2020   Procedure: DRUG INDUCED ENDOSCOPY;  Surgeon: Melida Quitter, MD;  Location: [place];  Service: ENT;  Laterality: N/A;   IMPLANTATION OF HYPOGLOSSAL NERVE STIMULATOR N/A 07/21/2020   Procedure: IMPLANTATION OF HYPOGLOSSAL NERVE STIMULATOR;  Surgeon: Melida Quitter, MD;  Location: [place];  Service: ENT;  Laterality: N/A;   LEAD EXTRACTION N/A 04/17/2022   Procedure: LEAD EXTRACTION;  Surgeon: Vickie Epley, MD;  Location: [facility];  Service: Cardiovascular;  Laterality: N/A;   LEG SURGERY Left    distal   Pace maker placement     PROSTATE SURGERY     PROSTATECTOMY     SKIN CANCER EXCISION  2006,2007   Merkle Cell Carcinoma   TEE WITHOUT CARDIOVERSION N/A 04/14/2022   Procedure: TRANSESOPHAGEAL ECHOCARDIOGRAM (TEE);  Surgeon: Lelon Perla, MD;  Location[facility] END[facility]COPY;   Service: Cardiovascular;  Laterality: N/A;   HPI:  Pt is a 77 y/o male who presented with worsening cough, poor secretion management, and shortness of breath. Admitted with multifocal PNA. Following a recent pacemaker infection, pt was admitted to [facility] 12/22. Prior swallow eval (06/27/15) completed at another Perkins was Douglas Perkins with no remarkable findings. He has a hx of prostate and merkel cell cancer, sleep apnea, and TIA. Pt uses 3L [place] at baseline.    Assessment / Plan / Recommendation  Clinical Impression  Pt's oral cavity, strength, and ROM all WFL. During presentations of all POs, Perkins noted multiple throat clears particularly with thin liquids. Pt stated that is normal for him; however, he says he has been coughing while eating during current hospitalization. He also says he has never experienced any problems swallowing. Given conflicting presentation, MBS was offered. Pt stated he has no prior hx of PNA or GI problems and does not want f/u or instrumental swallow study. No signs of dysphagia were otherwise observed so continue with regular diet. Discussed overall findings and plan with MD who requests f/u x1. Perkins Visit Diagnosis: Dysphagia, unspecified (R13.10)    Aspiration Risk  Mild aspiration risk (due to status of PNA)    Diet Recommendation Regular;Thin liquid   Liquid Administration via: Cup;Straw Medication Administration: Whole meds with liquid Supervision: Patient able to self feed Compensations: Slow rate;Small sips/bites Postural Changes: Seated upright at 90 degrees    Other  Recommendations Oral Care Recommendations: Oral care BID    Recommendations for follow up therapy are one component of a multi-disciplinary discharge planning process, led by the attending physician.  Recommendations may be updated based on patient status, additional functional criteria and insurance authorization.  Follow up Recommendations No Perkins follow up      Assistance Recommended at Discharge     Functional Status Assessment Patient has had a recent decline in their functional status and demonstrates the ability to make significant improvements in function in a reasonable and predictable amount of time.  Frequency and Duration min 1 x/week  1 week       Prognosis        Swallow Study   General HPI: Pt is a 77 y/o male who presented with worsening cough, poor secretion management, and shortness of breath. Admitted with multifocal PNA. Following a recent pacemaker infection, pt was admitted to [facility] 12/22. Prior swallow eval (06/27/15) completed at another Perkins was Douglas Perkins with no remarkable findings. He has a hx of prostate and merkel cell cancer, sleep apnea, and TIA. Pt uses 3L [place] at baseline. Type of Study: Bedside Swallow Evaluation Previous Swallow Assessment: see HPI Diet Prior to this Study: Regular;Thin liquids Temperature Spikes Noted: No Respiratory Status: Nasal cannula History of Recent Intubation: No Behavior/Cognition: Alert;Cooperative;Pleasant mood Oral Cavity Assessment: Within Functional Limits Oral Care Completed by Perkins: No Oral Cavity - Dentition: Adequate natural dentition Vision: Functional for self-feeding Self-Feeding Abilities: Able to feed self Patient Positioning: Upright in bed Baseline Vocal Quality: Normal Volitional Cough: Strong Volitional Swallow: Able to elicit    Oral/Motor/Sensory Function Overall Oral Motor/Sensory Function: Within functional limits   Ice Chips Ice chips: Not tested   Thin Liquid Thin Liquid: Impaired Presentation: Cup;Straw Pharyngeal  Phase Impairments: Throat Clearing - Delayed    Nectar Thick Nectar Thick Liquid: Not tested   Honey Thick Honey Thick Liquid: Not tested   Puree Puree: Within functional limits Presentation: Douglas Perkins   Solid     Solid: Within functional limits Presentation: Douglas Perkins  05/12/2022,3:27 PM

## 2022-05-12 NOTE — Progress Notes (Signed)
Pt with speech therapy.

## 2022-05-13 ENCOUNTER — Inpatient Hospital Stay (HOSPITAL_COMMUNITY): Payer: Medicare Other

## 2022-05-13 DIAGNOSIS — J189 Pneumonia, unspecified organism: Secondary | ICD-10-CM | POA: Diagnosis not present

## 2022-05-13 DIAGNOSIS — F319 Bipolar disorder, unspecified: Secondary | ICD-10-CM | POA: Diagnosis not present

## 2022-05-13 DIAGNOSIS — J9601 Acute respiratory failure with hypoxia: Secondary | ICD-10-CM | POA: Diagnosis not present

## 2022-05-13 DIAGNOSIS — I5033 Acute on chronic diastolic (congestive) heart failure: Secondary | ICD-10-CM | POA: Diagnosis not present

## 2022-05-13 LAB — CBC
HCT: 30.5 % — ABNORMAL LOW (ref 39.0–52.0)
Hemoglobin: 10.5 g/dL — ABNORMAL LOW (ref 13.0–17.0)
MCH: 32.1 pg (ref 26.0–34.0)
MCHC: 34.4 g/dL (ref 30.0–36.0)
MCV: 93.3 fL (ref 80.0–100.0)
Platelets: 240 10*3/uL (ref 150–400)
RBC: 3.27 MIL/uL — ABNORMAL LOW (ref 4.22–5.81)
RDW: 13.7 % (ref 11.5–15.5)
WBC: 12.3 10*3/uL — ABNORMAL HIGH (ref 4.0–10.5)
nRBC: 0 % (ref 0.0–0.2)

## 2022-05-13 LAB — BASIC METABOLIC PANEL
Anion gap: 10 (ref 5–15)
BUN: 13 mg/dL (ref 8–23)
CO2: 27 mmol/L (ref 22–32)
Calcium: 7.9 mg/dL — ABNORMAL LOW (ref 8.9–10.3)
Chloride: 96 mmol/L — ABNORMAL LOW (ref 98–111)
Creatinine, Ser: 0.74 mg/dL (ref 0.61–1.24)
GFR, Estimated: >60 mL/min (ref >60)
Glucose, Bld: 136 mg/dL — ABNORMAL HIGH (ref 70–99)
Potassium: 3 mmol/L — ABNORMAL LOW (ref 3.5–5.1)
Sodium: 133 mmol/L — ABNORMAL LOW (ref 135–145)

## 2022-05-13 LAB — MRSA NEXT GEN BY PCR, NASAL: MRSA by PCR Next Gen: NOT DETECTED

## 2022-05-13 MED ORDER — FUROSEMIDE 10 MG/ML IJ SOLN
INTRAMUSCULAR | Status: AC
  Administered 2022-05-13: 20 mg via INTRAVENOUS
  Filled 2022-05-13: qty 2

## 2022-05-13 MED ORDER — FUROSEMIDE 10 MG/ML IJ SOLN: 20.0000 mg | Freq: Once | INTRAMUSCULAR | Status: AC

## 2022-05-13 MED ORDER — POTASSIUM CHLORIDE CRYS ER 20 MEQ PO TBCR: 20.0000 meq | EXTENDED_RELEASE_TABLET | Freq: Every day | ORAL | Status: DC

## 2022-05-13 MED ORDER — POTASSIUM CHLORIDE CRYS ER 20 MEQ PO TBCR
40.0000 meq | EXTENDED_RELEASE_TABLET | Freq: Once | ORAL | Status: AC
  Administered 2022-05-13: 40 meq via ORAL
  Filled 2022-05-13: qty 2

## 2022-05-13 MED ORDER — FUROSEMIDE 10 MG/ML IJ SOLN
60.0000 mg | Freq: Two times a day (BID) | INTRAMUSCULAR | Status: DC
  Administered 2022-05-13: 60 mg via INTRAVENOUS
  Filled 2022-05-13: qty 6

## 2022-05-13 MED ORDER — FUROSEMIDE 10 MG/ML IJ SOLN
40.0000 mg | Freq: Two times a day (BID) | INTRAMUSCULAR | Status: DC
  Administered 2022-05-13: 40 mg via INTRAVENOUS
  Filled 2022-05-13: qty 4

## 2022-05-13 NOTE — Progress Notes (Signed)
PROGRESS NOTE        PATIENT DETAILS Name: Douglas Perkins. Age: 77 y.o. Sex: male Date of Birth: December 16, 1945 Admit Date: 05/11/2022 Admitting Physician Kayleen Memos, DO PCP:Pcp, No  Brief Summary: Patient is a 77 y.o.  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  Significant events: 12/10-12/22>> hospitalization for group B strep bacteremia with pacemaker lead implantation.  Discharged on IV penicillin-end date 1/29. 01/11>> admit to [facility]-hypoxia due to PNA-initially on BiPAP.  Liberated off BiPAP post admission 01/12>> on around 8-10 L of HFNC. 01/13>> Down to 6 L of HFNC  Significant studies: 01/11>> CXR: Right> left infiltrates 01/12>> TTE: EF 55-60%, no obvious vegetation-but limited evaluation due to poor windows.  Significant microbiology data: 1/11>> COVID/influenza/RSV PCR: Negative 1/11>> blood culture: No growth 1/12>> respiratory virus panel: neg  Procedures: 12/15>> TEE: EF 55-60%-no valvular vegetation visualized 12/18>> permanent pacemaker lead explantation-implantation of leadless pacemaker.  Consults: ID  Subjective: Feels better-down to 6 L of HFNC.  Objective: Vitals: Blood pressure 120/69, pulse 75, temperature 97.8 F (36.6 C), temperature source Axillary, resp. rate (!) 24, height 6' (1.829 m), weight 116.1 kg, SpO2 95 %.   Exam: Gen Exam:Alert awake-not in any distress HEENT:atraumatic, normocephalic Chest: Bibasilar rales CVS:S1S2 regular Abdomen:soft non tender, non distended Extremities: Right leg atrophy at baseline-some chronic edema at baseline.  Trace left leg edema.  Neurology: Non focal Skin: no  rash  Pertinent Labs/Radiology:    Latest Ref Rng & Units 05/13/2022    2:01 AM 05/12/2022    6:04 AM 05/11/2022    4:10 AM  CBC  WBC 4.0 - 10.5 K/uL 12.3  12.5    Hemoglobin 13.0 - 17.0 g/dL 10.5  10.5  10.9   Hematocrit 39.0 - 52.0 % 30.5  30.7  32.0   Platelets 150 - 400 K/uL 240  248      Lab Results  Component Value Date   NA 133 (L) 05/13/2022   K 3.0 (L) 05/13/2022   CL 96 (L) 05/13/2022   CO2 27 05/13/2022      Assessment/Plan: Acute hypoxic respiratory failure due to combination of multifocal PNA and HFpEF exacerbation Hypoxia improving-initially on BiPAP Down to 6 L of HFNC this morning-continue with attempt to titrate down FiO2.  Continue antibiotics/diuretics.  Multifocal PNA Continue vancomycin/cefepime Cultures negative so far-unfortunately-MRSA PCR never sent-will ask nursing to send today If MRSA PCR negative-ID recommends that we discontinue vancomycin In the interim continue cefepime/vancomycin No overt aspiration by SLP-but patient did not want to pursue MBS.  Asked SLP to reevaluate sometime again next week.   HFpEF exacerbation Improving leg edema-improving hypoxemia Continue IV Lasix Unfortunately-urine output not documented-have discussed with nursing staff this morning to see if we could start documenting daily weights/intake/output. Monitor electrolytes closely.  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 Was on IV penicillin V through 1/29.  Currently on vancomycin/PM to cover PNA-and penicillin V on hold.  History of CAD No angina-no wall motion abnormality on recent echo Continue aspirin/statin/Coreg  History of symptomatic sinus node dysfunction Now with leadless pacemaker in place. Telemetry monitoring  HTN Continue Coreg-resumed at a much lower dose Continue to hold  losartan to allow room for diuresis.    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   Weight as of this encounter: 116.1 kg.   Code status:   Code Status: Full Code   DVT Prophylaxis: Prophylactic Lovenox   Family Communication: Spouse-[phone] updated 1/13 over the phone   Disposition Plan: Status is: Inpatient Remains inpatient appropriate because: Severity of illness   Planned Discharge Destination:Skilled nursing facility   Diet: Diet Order             Diet Heart Room service appropriate? Yes; Fluid consistency: Thin; Fluid restriction: 1500 mL Fluid  Diet effective now                     Antimicrobial agents: Anti-infectives (From admission, onward)    Start     Dose/Rate Route Frequency Ordered Stop   05/11/22 1430  vancomycin (VANCOREADY) IVPB 1250 mg/250 mL        1,250 mg 166.7 mL/hr over 90 Minutes Intravenous Every 12 hours 05/11/22 0825     05/11/22 1000  ceFEPIme (MAXIPIME) 2 g in sodium chloride 0.9 % 100 mL IVPB        2 g 200 mL/hr over 30 Minutes Intravenous Every 8 hours 05/11/22 0737     05/11/22 0330  vancomycin (VANCOCIN) IVPB 1000 mg/200 mL premix        1,000 mg 200 mL/hr over 60 Minutes Intravenous  Once 05/11/22 0329 05/11/22 0528   05/11/22 0330  ceFEPIme (MAXIPIME) 2 g in sodium chloride 0.9 % 100 mL IVPB        2 g 200 mL/hr over 30 Minutes Intravenous STAT 05/11/22 0329 05/11/22 0528        MEDICATIONS: Scheduled Meds:  aspirin EC  81 mg Oral Daily   atorvastatin  40 mg Oral Daily   carvedilol  20 mg Oral Daily    Chlorhexidine Gluconate Cloth  6 each Topical Daily   divalproex  1,000 mg Oral QHS   dorzolamide  1 drop Right Eye BID   enoxaparin (LOVENOX) injection  50 mg Subcutaneous Q24H   furosemide  60 mg Intravenous BID   gabapentin  300 mg Oral BID   latanoprost  1 drop Right Eye BID   melatonin  10 mg Oral QHS   mometasone-formoterol  2 puff Inhalation BID   pantoprazole  40 mg Oral BID   [START ON 05/14/2022] potassium chloride  20 mEq Oral Daily   QUEtiapine  300 mg Oral QHS   sodium chloride flush  3 mL Intravenous Q12H   tamsulosin  0.4 mg Oral QHS   timolol  1 drop Right Eye BID   Continuous Infusions:  ceFEPime (MAXIPIME) IV 200 mL/hr at 05/13/22 0932   vancomycin Stopped (05/13/22 0307)   PRN Meds:.acetaminophen **OR** acetaminophen, albuterol, hydrALAZINE   I have personally reviewed following labs and imaging studies  LABORATORY DATA: CBC: Recent Labs  Lab 05/11/22 0301 05/11/22 0410 05/12/22 0604 05/13/22 0201  WBC 11.8*  --  12.5* 12.3*  NEUTROABS 9.7*  --   --   --   HGB 10.6* 10.9* 10.5* 10.5*  HCT 32.9* 32.0* 30.7* 30.5*  MCV 96.8  --  92.2 93.3  PLT 241  --  248 240     Basic Metabolic Panel: Recent Labs  Lab 05/11/22 0301 05/11/22 0410 05/12/22 0604 05/13/22 0201  NA 131* 132* 135 133*  K 4.7 4.7 3.5 3.0*  CL 100  --  99 96*  CO2 23  --  23 27  GLUCOSE 143*  --  120* 136*  BUN 13  --  14 13  CREATININE 0.70  --  0.70 0.74  CALCIUM 8.2*  --  7.9* 7.9*     GFR: Estimated Creatinine Clearance: 100.1 mL/min (by C-G formula based on SCr of 0.74 mg/dL).  Liver Function Tests: No results for input(s): "AST", "ALT", "ALKPHOS", "BILITOT", "PROT", "ALBUMIN" in the last 168 hours. No results for input(s): "LIPASE", "AMYLASE" in the last 168 hours. No results for input(s): "AMMONIA" in the last 168 hours.  Coagulation Profile: No results for input(s): "INR", "PROTIME" in the last 168 hours.  Cardiac Enzymes: No results for input(s): "CKTOTAL",  "CKMB", "CKMBINDEX", "TROPONINI" in the last 168 hours.  BNP (last 3 results) No results for input(s): "PROBNP" in the last 8760 hours.  Lipid Profile: No results for input(s): "CHOL", "HDL", "LDLCALC", "TRIG", "CHOLHDL", "LDLDIRECT" in the last 72 hours.  Thyroid Function Tests: No results for input(s): "TSH", "T4TOTAL", "FREET4", "T3FREE", "THYROIDAB" in the last 72 hours.  Anemia Panel: No results for input(s): "VITAMINB12", "FOLATE", "FERRITIN", "TIBC", "IRON", "RETICCTPCT" in the last 72 hours.  Urine analysis:    Component Value Date/Time   COLORURINE YELLOW 04/09/2022 2135   APPEARANCEUR HAZY (A) 04/09/2022 2135   APPEARANCEUR Clear 05/24/2015 1328   LABSPEC 1.017 04/09/2022 2135   PHURINE 6.0 04/09/2022 2135   GLUCOSEU NEGATIVE 04/09/2022 2135   HGBUR SMALL (A) 04/09/2022 2135   BILIRUBINUR NEGATIVE 04/09/2022 2135   BILIRUBINUR Negative 05/24/2015 [place] 04/09/2022 2135   PROTEINUR 30 (A) 04/09/2022 2135   NITRITE NEGATIVE 04/09/2022 2135   LEUKOCYTESUR NEGATIVE 04/09/2022 2135    Sepsis Labs: Lactic Acid, Venous    Component Value Date/Time   LATICACIDVEN 0.8 04/11/2022 0755    MICROBIOLOGY: Recent Results (from the past 240 hour(s))  Resp panel by RT-PCR (RSV, Flu A&B, Covid) Anterior Nasal Swab     Status: None   Collection Time: 05/11/22  3:01 AM   Specimen: Anterior Nasal Swab  Result Value Ref Range Status   SARS Coronavirus 2 by RT PCR NEGATIVE NEGATIVE Final    Comment: (NOTE) SARS-CoV-2 target nucleic acids are NOT DETECTED.  The SARS-CoV-2 RNA is generally detectable in upper respiratory specimens during the acute phase of infection. The lowest concentration of SARS-CoV-2 viral copies this assay can detect is 138 copies/mL. A negative result does not preclude SARS-Cov-2 infection and should not be used as the sole basis for treatment or other patient management decisions. A negative result may occur with  improper specimen  collection/handling, submission of specimen other than nasopharyngeal swab, presence of viral mutation(s) within the areas targeted by this assay, and inadequate number of viral copies(<138 copies/mL). A negative result must be combined with clinical observations, patient history, and epidemiological information. The expected result is Negative.  Fact Sheet for Patients:  EntrepreneurPulse.com.au  Fact Sheet for Healthcare Providers:  IncredibleEmployment.be  This test is no t yet approved  or cleared by the [place] and  has been authorized for detection and/or diagnosis of SARS-CoV-2 by FDA under an Emergency Use Authorization (EUA). This EUA will remain  in effect (meaning this test can be used) for the duration of the COVID-19 declaration under Section 564(b)(1) of the Act, 21 U.S.C.section 360bbb-3(b)(1), unless the authorization is terminated  or revoked sooner.       Influenza A by PCR NEGATIVE NEGATIVE Final   Influenza B by PCR NEGATIVE NEGATIVE Final    Comment: (NOTE) The Xpert Xpress SARS-CoV-2/FLU/RSV plus assay is intended as an aid in the diagnosis of influenza from Nasopharyngeal swab specimens and should not be used as a sole basis for treatment. Nasal washings and aspirates are unacceptable for Xpert Xpress SARS-CoV-2/FLU/RSV testing.  Fact Sheet for Patients: EntrepreneurPulse.com.au  Fact Sheet for Healthcare Providers: IncredibleEmployment.be  This test is not yet approved or cleared by the [place] FDA and has been authorized for detection and/or diagnosis of SARS-CoV-2 by FDA under an Emergency Use Authorization (EUA). This EUA will remain in effect (meaning this test can be used) for the duration of the COVID-19 declaration under Section 564(b)(1) of the Act, 21 U.S.C. section 360bbb-3(b)(1), unless the authorization is terminated or revoked.     Resp Syncytial  Virus by PCR NEGATIVE NEGATIVE Final    Comment: (NOTE) Fact Sheet for Patients: EntrepreneurPulse.com.au  Fact Sheet for Healthcare Providers: IncredibleEmployment.be  This test is not yet approved or cleared by the [place] FDA and has been authorized for detection and/or diagnosis of SARS-CoV-2 by FDA under an Emergency Use Authorization (EUA). This EUA will remain in effect (meaning this test can be used) for the duration of the COVID-19 declaration under Section 564(b)(1) of the Act, 21 U.S.C. section 360bbb-3(b)(1), unless the authorization is terminated or revoked.  Performed at [facility], [place] [address] [place], [place] [address]   Respiratory (~20 pathogens) panel by PCR     Status: None   Collection Time: 05/11/22  3:01 AM   Specimen: Nasopharyngeal Swab; Respiratory  Result Value Ref Range Status   Adenovirus NOT DETECTED NOT DETECTED Final   Coronavirus 229E NOT DETECTED NOT DETECTED Final    Comment: (NOTE) The Coronavirus on the Respiratory Panel, DOES NOT test for the novel  Coronavirus (2019 nCoV)    Coronavirus HKU1 NOT DETECTED NOT DETECTED Final   Coronavirus NL63 NOT DETECTED NOT DETECTED Final   Coronavirus OC43 NOT DETECTED NOT DETECTED Final   Metapneumovirus NOT DETECTED NOT DETECTED Final   Rhinovirus / Enterovirus NOT DETECTED NOT DETECTED Final   Influenza A NOT DETECTED NOT DETECTED Final   Influenza B NOT DETECTED NOT DETECTED Final   Parainfluenza Virus 1 NOT DETECTED NOT DETECTED Final   Parainfluenza Virus 2 NOT DETECTED NOT DETECTED Final   Parainfluenza Virus 3 NOT DETECTED NOT DETECTED Final   Parainfluenza Virus 4 NOT DETECTED NOT DETECTED Final   Respiratory Syncytial Virus NOT DETECTED NOT DETECTED Final   Bordetella pertussis NOT DETECTED NOT DETECTED Final   Bordetella Parapertussis NOT DETECTED NOT DETECTED Final   Chlamydophila pneumoniae NOT DETECTED NOT DETECTED Final   Mycoplasma  pneumoniae NOT DETECTED NOT DETECTED Final    Comment: Performed at [facility], [place]. [address], [place], [place] [address]  Blood culture (routine x 2)     Status: None (Preliminary result)   Collection Time: 05/11/22  3:43 AM   Specimen: BLOOD LEFT HAND  Result Value Ref Range Status  Specimen Description BLOOD LEFT HAND  Final   Special Requests   Final    BOTTLES DRAWN AEROBIC AND ANAEROBIC Blood Culture results may not be optimal due to an inadequate volume of blood received in culture bottles   Culture   Final    NO GROWTH 2 DAYS Performed at [facility], [place] [address], [place], [place] [address]    Report Status PENDING  Incomplete    RADIOLOGY STUDIES/RESULTS: DG Chest Port 1 View  Result Date: 05/13/2022 CLINICAL DATA:  Shortness of breath. EXAM: PORTABLE CHEST 1 VIEW COMPARISON:  05/11/2022 FINDINGS: Stable mild cardiomegaly. Right arm PICC line remains looped in the internal jugular vein, with the tip overlying the proximal SVC. Neurostimulator device again seen in the right hemithorax. Patchy bilateral pulmonary airspace disease again seen bilaterally, with mild worsening noted in the right upper lobe and left midlung. No evidence of pneumothorax or pleural effusion. IMPRESSION: Patchy bilateral pulmonary airspace disease, with mild worsening in right upper lobe and left midlung. Right arm PICC line remains looped in the internal jugular vein, with tip overlying the proximal SVC. Electronically Signed   By: Marlaine Hind M.D.   On: 05/13/2022 10:04   ECHOCARDIOGRAM LIMITED  Result Date: 05/12/2022    ECHOCARDIOGRAM LIMITED REPORT   Patient Name:   Saad Buhl. Date of Exam: 05/12/2022 Medical Rec #:  [id number]           Height:       72.0 in Accession #:    [id number]          Weight:       256.0 lb Date of Birth:  08/27/1945           BSA:          2.366 m Patient Age:    49 years            BP:           129/76 mmHg Patient Gender: M                   HR:            65 bpm. Exam Location:  Inpatient Procedure: Limited Echo, Cardiac Doppler and Limited Color Doppler Indications:    Endocarditis I38  History:        Patient has prior history of Echocardiogram examinations, most                 recent 04/17/2022. Thrombus, Pacemaker, Stroke and cancer,                 Signs/Symptoms:Shortness of Breath; Risk Factors:Non-Smoker and                 Hypertension.  Sonographer:    Greer Pickerel Referring Phys: [id number] CORNELIUS N VAN DAM  Sonographer Comments: Patient is obese. Image acquisition challenging due to respiratory motion. IMPRESSIONS  1. Poor quality study. Overall, LVEF appears normal but cannot assess for wall motion. Limited evaluation of valvular structures due to poor windows. Suggest TEE if there clinical concerns for endocarditis.  2. Left ventricular ejection fraction, by estimation, is 55 to 60%. The left ventricle has normal function. Left ventricular endocardial border not optimally defined to evaluate regional wall motion. Indeterminate diastolic filling due to E-A fusion.  3. Right ventricular systolic function was not well visualized. The right ventricular size is not well visualized.  4. The mitral valve was not well visualized.  5. The  aortic valve was not well visualized.  6. The inferior vena cava is dilated in size with <50% respiratory variability, suggesting right atrial pressure of 15 mmHg. FINDINGS  Left Ventricle: Left ventricular ejection fraction, by estimation, is 55 to 60%. The left ventricle has normal function. Left ventricular endocardial border not optimally defined to evaluate regional wall motion. Indeterminate diastolic filling due to E-A fusion. Right Ventricle: The right ventricular size is not well visualized. Right vetricular wall thickness was not well visualized. Right ventricular systolic function was not well visualized. Pericardium: Trivial pericardial effusion is present. Mitral Valve: The mitral valve was not well visualized.  Tricuspid Valve: The tricuspid valve is not well visualized. Aortic Valve: The aortic valve was not well visualized. Pulmonic Valve: The pulmonic valve was not well visualized. Venous: The inferior vena cava is dilated in size with less than 50% respiratory variability, suggesting right atrial pressure of 15 mmHg. Additional Comments: Spectral Doppler performed. Color Doppler performed.  Eleonore Chiquito MD Electronically signed by Eleonore Chiquito MD Signature Date/Time: 05/12/2022/3:34:18 PM    Final      LOS: 2 days   Oren Binet, MD  [facility]    To contact the attending provider between 7A-7P or the covering provider during after hours 7P-7A, please log into the web site [email or web address] and access using universal  password for that web site. If you do not have the password, please call the hospital operator.  05/13/2022, 11:26 AM

## 2022-05-13 NOTE — Evaluation (Signed)
Occupational Therapy Evaluation Patient Details Name: Douglas Perkins. MRN: [id number] DOB: 29-Feb-1946 Today's Date: 05/13/2022   History of Present Illness Pt is a 77 y/o M admitted on 1/11 for SOB. 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 Pt on 3L [place] at baseline. 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   Clinical Impression   Pt presents with decline in function and safety with ADLs and ADL mobility with impaired strength, balance and endurance. Pt requires encouragement to participate. PTA, pt live dat an ALF and required assist with LB selfcare and used a rollater for mobility. Pt currently requires min A with UB ADLs, max A with LB ADLs; pt sat EOB with assist to elevate trunk and with LEs. Pt refused attempting sit - stand and SPTs and stated "I'll wait until tomorrow". Pt would benefit from acute OT serviices to address impairments to maximize level of function and safety     Recommendations for follow up therapy are one component of a multi-disciplinary discharge planning process, led by the attending physician.  Recommendations may be updated based on patient status, additional functional criteria and insurance authorization.   Follow Up Recommendations  Skilled nursing-short term rehab (<3 hours/day)     Assistance Recommended at Discharge Frequent or constant Supervision/Assistance  Patient can return home with the following A lot of help with bathing/dressing/bathroom;A lot of help with walking and/or transfers;Direct supervision/assist for financial management;Assist for transportation;Direct supervision/assist for medications management    Functional Status Assessment  Patient has had a recent decline in their functional status and demonstrates the ability to make significant improvements in function in a reasonable and predictable amount of  time.  Equipment Recommendations  None recommended by OT (TBD at next venue of care)    Recommendations for Other Services       Precautions / Restrictions Precautions Precautions: Fall;ICD/Pacemaker Precaution Comments: recent extraction of PPM and replacement with leadless PPM on 12/18 Restrictions Weight Bearing Restrictions: No      Mobility Bed Mobility Overal bed mobility: Needs Assistance Bed Mobility: Supine to Sit, Sit to Supine     Supine to sit: Min assist, HOB elevated Sit to supine: Min assist, HOB elevated   General bed mobility comments: minA required for pulling self up with use of handrails and HOB elevated for supine>sit. Pt with min A for sit>supine for trunk elevation and B LEs    Transfers                   General transfer comment: pt adamanty refused sit - stand and transfers      Balance Overall balance assessment: Independent Sitting-balance support: Bilateral upper extremity supported, Feet supported Sitting balance-Leahy Scale: Fair         Standing balance comment: refused                           ADL either performed or assessed with clinical judgement   ADL Overall ADL's : Needs assistance/impaired Eating/Feeding: Modified independent;Bed level   Grooming: Set up;Sitting;Min guard   Upper Body Bathing: Minimal assistance;Sitting   Lower Body Bathing: Maximal assistance   Upper Body Dressing : Minimal assistance;Sitting   Lower Body Dressing: Maximal assistance Lower Body Dressing Details (indicate cue type and reason): assist to don socks   Toilet Transfer Details (indicate cue type and  reason): refused Toileting- Clothing Manipulation and Hygiene: Moderate assistance;Sitting/lateral lean Toileting - Clothing Manipulation Details (indicate cue type and reason): simulated sitting EOB     Functional mobility during ADLs:  (refused attempting sit - stand and transfers)       Vision Baseline  Vision/History: 1 Wears glasses Patient Visual Report: No change from baseline       Perception     Praxis      Pertinent Vitals/Pain Pain Assessment Pain Assessment: Faces Pain Location: all over- pt reports "normal arthritis" Pain Descriptors / Indicators: Aching Pain Intervention(s): Limited activity within patient's tolerance, Monitored during session, Repositioned     Hand Dominance Right   Extremity/Trunk Assessment Upper Extremity Assessment Upper Extremity Assessment: Generalized weakness   Lower Extremity Assessment Lower Extremity Assessment: Defer to PT evaluation   Cervical / Trunk Assessment Cervical / Trunk Assessment: Kyphotic   Communication Communication Communication: HOH   Cognition Arousal/Alertness: Awake/alert Behavior During Therapy: WFL for tasks assessed/performed Overall Cognitive Status: Within Functional Limits for tasks assessed                                 General Comments: pt pleasant but becomes anxious/irritable with mobility sitting EOB, refused to attemot sit - stand and transfers     General Comments       Exercises     Shoulder Instructions      Home Living Family/patient expects to be discharged to:: [place]: Rollator (4 wheels);Shower seat - built in          Prior Functioning/Environment Prior Level of Function : Needs assist             Mobility Comments: pt resides at an ALF and was receiving PT 3x/week, recently discharged to SNF where he was receiving assistance with all mobility. Pt reports he last stood 4 days ago with assistance. ADLs Comments: Fitness sessions M, W, F. Staff provides meals, medication management, assist with getting OOB, LB dressing, laundry, light housekeeping. Pt also reports that staff provides set-up for him to perform Ub ADLsgrooming tasks in his bed with HOB elevated. Getting PT 3x/week         OT Problem List: Decreased strength;Impaired balance (sitting and/or standing);Decreased activity tolerance;Decreased knowledge of use of DME or AE;Cardiopulmonary status limiting activity;Pain;Obesity      OT Treatment/Interventions: Self-care/ADL training;Therapeutic exercise;DME and/or AE instruction;Therapeutic activities;Patient/family education;Balance training    OT Goals(Current goals can be found in the care plan section) Acute Rehab OT Goals Patient Stated Goal: go to rehab OT Goal Formulation: With patient Time For Goal Achievement: 05/27/22 Potential to Achieve Goals: Good ADL Goals Pt Will Perform Grooming: with supervision;with set-up;sitting Pt Will Perform Upper Body Bathing: with min guard assist;sitting Pt Will Perform Lower Body Bathing: with mod assist;sitting/lateral leans Pt Will Perform Upper Body Dressing: with min guard assist Pt Will Transfer to Toilet: with max assist;with mod assist;with +2 assist;stand pivot transfer;bedside commode  OT Frequency: Min 2X/week    Co-evaluation              AM-PAC OT "6 Clicks" Daily Activity     Outcome Measure Help from another person eating meals?: None Help from another person taking care of personal grooming?: A Little Help from another  person toileting, which includes using toliet, bedpan, or urinal?: A Lot Help from another person bathing (including washing, rinsing, drying)?: A Lot Help from another person to put on and taking off regular upper body clothing?: A Little Help from another person to put on and taking off regular lower body clothing?: A Lot 6 Click Score: 16   End of Session    Activity Tolerance: Patient limited by fatigue Patient left: in chair;with call bell/phone within reach;with chair alarm set  OT Visit Diagnosis: Muscle weakness (generalized) (M62.81);Other abnormalities of gait and mobility (R26.89);Pain Pain - Right/Left:  (bilaterally) Pain - part of body: Shoulder;Knee;Leg;Ankle  and joints of foot                Time: 0630-1601 OT Time Calculation (min): 19 min Charges:  OT General Charges $OT Visit: 1 Visit OT Evaluation $OT Eval Moderate Complexity: 1 Mod OT Treatments $Therapeutic Activity: 8-22 mins    Britt Bottom 05/13/2022, 12:20 PM

## 2022-05-14 ENCOUNTER — Inpatient Hospital Stay (HOSPITAL_COMMUNITY): Payer: Medicare Other

## 2022-05-14 DIAGNOSIS — J189 Pneumonia, unspecified organism: Secondary | ICD-10-CM | POA: Diagnosis not present

## 2022-05-14 LAB — CBC WITH DIFFERENTIAL/PLATELET
Abs Immature Granulocytes: 0.05 10*3/uL (ref 0.00–0.07)
Basophils Absolute: 0 10*3/uL (ref 0.0–0.1)
Basophils Relative: 0 %
Eosinophils Absolute: 0.5 10*3/uL (ref 0.0–0.5)
Eosinophils Relative: 5 %
HCT: 29.5 % — ABNORMAL LOW (ref 39.0–52.0)
Hemoglobin: 9.8 g/dL — ABNORMAL LOW (ref 13.0–17.0)
Immature Granulocytes: 1 %
Lymphocytes Relative: 18 %
Lymphs Abs: 1.7 10*3/uL (ref 0.7–4.0)
MCH: 31.3 pg (ref 26.0–34.0)
MCHC: 33.2 g/dL (ref 30.0–36.0)
MCV: 94.2 fL (ref 80.0–100.0)
Monocytes Absolute: 1.6 10*3/uL — ABNORMAL HIGH (ref 0.1–1.0)
Monocytes Relative: 18 %
Neutro Abs: 5.4 10*3/uL (ref 1.7–7.7)
Neutrophils Relative %: 58 %
Platelets: 259 10*3/uL (ref 150–400)
RBC: 3.13 MIL/uL — ABNORMAL LOW (ref 4.22–5.81)
RDW: 13.7 % (ref 11.5–15.5)
WBC: 9.2 10*3/uL (ref 4.0–10.5)
nRBC: 0 % (ref 0.0–0.2)

## 2022-05-14 LAB — BASIC METABOLIC PANEL
Anion gap: 12 (ref 5–15)
BUN: 24 mg/dL — ABNORMAL HIGH (ref 8–23)
CO2: 24 mmol/L (ref 22–32)
Calcium: 7.9 mg/dL — ABNORMAL LOW (ref 8.9–10.3)
Chloride: 99 mmol/L (ref 98–111)
Creatinine, Ser: 1.06 mg/dL (ref 0.61–1.24)
GFR, Estimated: >60 mL/min (ref >60)
Glucose, Bld: 116 mg/dL — ABNORMAL HIGH (ref 70–99)
Potassium: 3.3 mmol/L — ABNORMAL LOW (ref 3.5–5.1)
Sodium: 135 mmol/L (ref 135–145)

## 2022-05-14 LAB — C-REACTIVE PROTEIN: CRP: 15.5 mg/dL — ABNORMAL HIGH (ref <1.0)

## 2022-05-14 LAB — PROCALCITONIN: Procalcitonin: 0.16 ng/mL

## 2022-05-14 LAB — MAGNESIUM: Magnesium: 1.9 mg/dL (ref 1.7–2.4)

## 2022-05-14 LAB — BRAIN NATRIURETIC PEPTIDE: B Natriuretic Peptide: 1215.6 pg/mL — ABNORMAL HIGH (ref 0.0–100.0)

## 2022-05-14 MED ORDER — POTASSIUM CHLORIDE CRYS ER 20 MEQ PO TBCR
40.0000 meq | EXTENDED_RELEASE_TABLET | Freq: Every day | ORAL | Status: AC
  Administered 2022-05-14 – 2022-05-16 (×3): 40 meq via ORAL
  Filled 2022-05-14 (×3): qty 2

## 2022-05-14 MED ORDER — ALTEPLASE 2 MG IJ SOLR
2.0000 mg | Freq: Once | INTRAMUSCULAR | Status: AC
  Administered 2022-05-14: 2 mg
  Filled 2022-05-14: qty 2

## 2022-05-14 MED ORDER — SODIUM CHLORIDE 0.9% FLUSH
10.0000 mL | Freq: Two times a day (BID) | INTRAVENOUS | Status: DC
  Administered 2022-05-14 – 2022-05-18 (×10): 10 mL

## 2022-05-14 MED ORDER — POTASSIUM CHLORIDE CRYS ER 20 MEQ PO TBCR
40.0000 meq | EXTENDED_RELEASE_TABLET | Freq: Once | ORAL | Status: AC
  Administered 2022-05-14: 40 meq via ORAL
  Filled 2022-05-14: qty 2

## 2022-05-14 MED ORDER — POTASSIUM CHLORIDE CRYS ER 20 MEQ PO TBCR: 20.0000 meq | EXTENDED_RELEASE_TABLET | Freq: Every day | ORAL | Status: DC

## 2022-05-14 MED ORDER — POTASSIUM CHLORIDE CRYS ER 20 MEQ PO TBCR: 40.0000 meq | EXTENDED_RELEASE_TABLET | Freq: Every day | ORAL | Status: DC

## 2022-05-14 MED ORDER — SODIUM CHLORIDE 0.9% FLUSH: 10.0000 mL | INTRAVENOUS | Status: DC | PRN

## 2022-05-14 MED ORDER — FUROSEMIDE 10 MG/ML IJ SOLN
60.0000 mg | Freq: Every day | INTRAMUSCULAR | Status: AC
  Administered 2022-05-14 – 2022-05-16 (×3): 60 mg via INTRAVENOUS
  Filled 2022-05-14 (×3): qty 6

## 2022-05-14 NOTE — Progress Notes (Signed)
Id brief note  Afebrile Wbc down Mrsa nares cx negative O2 requirement down  Overall appear clinically improved  Admission bcx negative   A/p Acute hypoxemic respiratory failure Mild leukocytosis at presentation GBS pacer infection -- s/p extraction 12/18 and new leadless pacer placement -- plan 6wks abx until 05/29/22    ?pulm edema vs true infectious pna  -at this time agree stopping vancomycin -continue cefepime and would finish cefepime course after tomorrow 1/15 -will sign off -discussed with primary team   ---- Will continue to finish pacer infection with 2 more weeks abx until 1/29.  If picc remains can finish out pcn-g If picc gone can do oral abx with amoxicillin high dose or cefadroxil Discussed with primary team     OPAT Orders Discharge antibiotics to be given via PICC line Discharge antibiotics: Pcn-g  Duration: 6 weeks End Date: 05/29/22  [facility] Care Per Protocol:  Home health RN for IV administration and teaching; PICC line care and labs.    Labs weekly while on IV antibiotics: _x_ CBC with differential __ BMP _x_ CMP __ CRP __ ESR __ Vancomycin trough __ CK  _x_ Please pull PIC at completion of IV antibiotics __ Please leave PIC in place until doctor has seen patient or been notified  Fax weekly labs to [phone]  Clinic Follow Up Appt: 2/12 @ 2pm with dr Juleen China  @  [facility] [address] [place], [place], [place] [address] Phone: [phone]

## 2022-05-14 NOTE — Progress Notes (Signed)
BIPAP not needed patient has own device(inspire).

## 2022-05-14 NOTE — Progress Notes (Signed)
PROGRESS NOTE        PATIENT DETAILS Name: Douglas Perkins. Age: 77 y.o. Sex: male Date of Birth: 1945-07-03 Admit Date: 05/11/2022 Admitting Physician Kayleen Memos, DO PCP:Pcp, No  Brief Summary: Patient is a 77 y.o.  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  Significant events: 12/10-12/22>> hospitalization for group B strep bacteremia with pacemaker lead implantation.  Discharged on IV penicillin-end date 1/29. 01/11>> admit to [facility]-hypoxia due to PNA-initially on BiPAP.  Liberated off BiPAP post admission 01/12>> on around 8-10 L of HFNC. 01/13>> Down to 6 L of HFNC  Significant studies: 01/11>> CXR: Right> left infiltrates 01/12>> TTE: EF 55-60%, no obvious vegetation-but limited evaluation due to poor windows.  Significant microbiology data: 1/11>> COVID/influenza/RSV PCR: Negative 1/11>> blood culture: No growth 1/12>> respiratory virus panel: neg  Procedures: 12/15>> TEE: EF 55-60%-no valvular vegetation visualized 12/18>> permanent pacemaker lead explantation-implantation of leadless pacemaker.  Consults: ID  Subjective: Feels better-down to 6 L of HFNC.  Objective: Vitals: Blood pressure (!) 116/55, pulse 75, temperature 97.9 F (36.6 C), temperature source Axillary, resp. rate (!) 25, height 6' (1.829 m), weight 122.5 kg, SpO2 92 %.   Exam:  Awake Alert, No new F.N deficits, chronic right leg atrophy, [place].AT,PERRAL Supple Neck, No JVD,   Symmetrical Chest wall movement, Good air movement bilaterally, CTAB RRR,No Gallops, Rubs or new Murmurs,  +ve B.Sounds, Abd Soft, No tenderness,   Trace bipedal  edema   Assessment/Plan: Acute hypoxic respiratory failure due to combination of multifocal PNA and HFpEF exacerbation Hypoxia improving-initially on BiPAP Down to 6-8 L of HFNC this morning-continue with attempt to titrate down FiO2.  Continue antibiotics/diuretics.  He is improving with diuresis continue Lasix on 05/14/2022.  SpO2: 92 % O2 Flow Rate (L/min): 8 L/min FiO2 (%): 40 %   Multifocal PNA ID titrating antibiotics.  Clinically stable likely more consistent with CHF.  HFpEF exacerbation Improving leg edema-improving hypoxemia Continue IV Lasix Unfortunately-urine output not documented-have discussed with nursing staff this morning to see if we could start documenting daily weights/intake/output. Monitor electrolytes closely.  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 Was on IV penicillin V through 1/29.  Currently on vancomycin/PM to cover PNA-and penicillin V on hold.  History of CAD No angina-no wall motion abnormality on recent echo Continue aspirin/statin/Coreg  History of symptomatic sinus node dysfunction Now with leadless pacemaker in place. Telemetry monitoring  HTN Continue Coreg-resumed at a much lower dose Continue to hold losartan to allow room for diuresis.    Normocytic anemia Due to acute illness Transfuse if Hb<7  Hyponatremia Mild-of no clinical significance  Bipolar disorder Appears stable Continue Seroquel/Depakote/Neurontin  OSA Not on CPAP because he had a inspire device placed  History of Merkel cell carcinoma of right leg-s/p radiation therapy  Obesity: Estimated body mass index is 36.63 kg/m as calculated from the following:   Height as of this encounter: 6' (1.829 m).   Weight as of this encounter: 122.5 kg.   Code status:   Code Status: Full  Code   DVT  Prophylaxis: Prophylactic Lovenox   Family Communication: Spouse-Douglas Perkins-[phone] updated 1/13 over the phone   Disposition Plan: Status is: Inpatient Remains inpatient appropriate because: Severity of illness   Planned Discharge Destination:Skilled nursing facility   Diet: Diet Order             Diet Heart Room service appropriate? Yes; Fluid consistency: Thin; Fluid restriction: 1500 mL Fluid  Diet effective now                    MEDICATIONS: Scheduled Meds:  aspirin EC  81 mg Oral Daily   atorvastatin  40 mg Oral Daily   carvedilol  20 mg Oral Daily   Chlorhexidine Gluconate Cloth  6 each Topical Daily   divalproex  1,000 mg Oral QHS   dorzolamide  1 drop Right Eye BID   enoxaparin (LOVENOX) injection  50 mg Subcutaneous Q24H   furosemide  60 mg Intravenous Daily   gabapentin  300 mg Oral BID   latanoprost  1 drop Right Eye BID   melatonin  10 mg Oral QHS   mometasone-formoterol  2 puff Inhalation BID   pantoprazole  40 mg Oral BID   potassium chloride  40 mEq Oral Daily   QUEtiapine  300 mg Oral QHS   sodium chloride flush  10-40 mL Intracatheter Q12H   tamsulosin  0.4 mg Oral QHS   timolol  1 drop Right Eye BID   Continuous Infusions:  ceFEPime (MAXIPIME) IV 2 g (05/14/22 0436)   PRN Meds:.acetaminophen **OR** acetaminophen, albuterol, hydrALAZINE   I have personally reviewed following labs and imaging studies  LABORATORY DATA:  Recent Labs  Lab 05/11/22 0301 05/11/22 0410 05/12/22 0604 05/13/22 0201 05/14/22 0455  WBC 11.8*  --  12.5* 12.3* 9.2  HGB 10.6* 10.9* 10.5* 10.5* 9.8*  HCT 32.9* 32.0* 30.7* 30.5* 29.5*  PLT 241  --  248 240 259  MCV 96.8  --  92.2 93.3 94.2  MCH 31.2  --  31.5 32.1 31.3  MCHC 32.2  --  34.2 34.4 33.2  RDW 13.6  --  13.7 13.7 13.7  LYMPHSABS 0.8  --   --   --  1.7  MONOABS 1.3*  --   --   --  1.6*  EOSABS 0.0  --   --   --  0.5  BASOSABS 0.0  --   --   --  0.0    Recent Labs  Lab 05/11/22 0301  05/11/22 0313 05/11/22 0410 05/12/22 0604 05/13/22 0201 05/14/22 0455  NA 131*  --  132* 135 133* 135  K 4.7  --  4.7 3.5 3.0* 3.3*  CL 100  --   --  99 96* 99  CO2 23  --   --  '23 27 24  '$ ANIONGAP 8  --   --  '13 10 12  '$ GLUCOSE 143*  --   --  120* 136* 116*  BUN 13  --   --  14 13 24*  CREATININE 0.70  --   --  0.70 0.74 1.06  CRP  --   --   --   --   --  15.5*  PROCALCITON 0.10  --   --   --   --  0.16  BNP  --  1,118.5*  --   --   --  1,215.6*  MG  --   --   --   --   --  1.9  CALCIUM 8.2*  --   --  7.9* 7.9* 7.9*      RADIOLOGY STUDIES/RESULTS: DG Chest Port 1 View  Result Date: 05/14/2022 CLINICAL DATA:  Short of breath EXAM: PORTABLE CHEST 1 VIEW COMPARISON:  Prior chest x-ray yesterday FINDINGS: Stable cardiomegaly. Atherosclerotic calcifications present in the transverse aorta. Generator pack overlies the right chest with electrical lead extending cephalad. Persistent pulmonary vascular congestion. Overall, slight interval improvement in aeration with decreased interstitial airspace opacities. Persistent patchy foci of airspace opacity in the right upper lung, right lung base and left mid lung. IMPRESSION: 1. Overall, slight interval improvement in aeration of the lungs bilaterally likely reflecting a decrease in the pulmonary edema component. 2. Persistent multifocal patchy airspace opacities concerning for multifocal pneumonia. 3. Stable cardiomegaly. Electronically Signed   By: Jacqulynn Cadet M.D.   On: 05/14/2022 08:29   DG Chest Port 1 View  Result Date: 05/13/2022 CLINICAL DATA:  Shortness of breath. EXAM: PORTABLE CHEST 1 VIEW COMPARISON:  05/11/2022 FINDINGS: Stable mild cardiomegaly. Right arm PICC line remains looped in the internal jugular vein, with the tip overlying the proximal SVC. Neurostimulator device again seen in the right hemithorax. Patchy bilateral pulmonary airspace disease again seen bilaterally, with mild worsening noted in the right upper lobe and left  midlung. No evidence of pneumothorax or pleural effusion. IMPRESSION: Patchy bilateral pulmonary airspace disease, with mild worsening in right upper lobe and left midlung. Right arm PICC line remains looped in the internal jugular vein, with tip overlying the proximal SVC. Electronically Signed   By: Marlaine Hind M.D.   On: 05/13/2022 10:04   ECHOCARDIOGRAM LIMITED  Result Date: 05/12/2022    ECHOCARDIOGRAM LIMITED REPORT   Patient Name:   Neill Jurewicz. Date of Exam: 05/12/2022 Medical Rec #:  [id number]           Height:       72.0 in Accession #:    [id number]          Weight:       256.0 lb Date of Birth:  [birth year]           BSA:          2.366 m Patient Age:    81 years            BP:           129/76 mmHg Patient Gender: M                   HR:           65 bpm. Exam Location:  Inpatient Procedure: Limited Echo, Cardiac Doppler and Limited Color Doppler Indications:    Endocarditis I38  History:        Patient has prior history of Echocardiogram examinations, most                 recent 04/17/2022. Thrombus, Pacemaker, Stroke and cancer,                 Signs/Symptoms:Shortness of Breath; Risk Factors:Non-Smoker and                 Hypertension.  Sonographer:    Greer Pickerel Referring Phys: [id number] COR[address]EL[address] N VAN DAM  Sonographer Comments: Patient is obese. Image acquisition challenging due to respiratory motion. IMPRESSIONS  1. Poor quality study. Overall, LVEF appears normal but cannot assess for wall motion. Limited evaluation of valvular structures due to poor windows. Suggest TEE if there clinical concerns  for endocarditis.  2. Left ventricular ejection fraction, by estimation, is 55 to 60%. The left ventricle has normal function. Left ventricular endocardial border not optimally defined to evaluate regional wall motion. Indeterminate diastolic filling due to E-A fusion.  3. Right ventricular systolic function was not well visualized. The right ventricular size is not well visualized.  4. The  mitral valve was not well visualized.  5. The aortic valve was not well visualized.  6. The inferior vena cava is dilated in size with <50% respiratory variability, suggesting right atrial pressure of 15 mmHg. FINDINGS  Left Ventricle: Left ventricular ejection fraction, by estimation, is 55 to 60%. The left ventricle has normal function. Left ventricular endocardial border not optimally defined to evaluate regional wall motion. Indeterminate diastolic filling due to E-A fusion. Right Ventricle: The right ventricular size is not well visualized. Right vetricular wall thickness was not well visualized. Right ventricular systolic function was not well visualized. Pericardium: Trivial pericardial effusion is present. Mitral Valve: The mitral valve was not well visualized. Tricuspid Valve: The tricuspid valve is not well visualized. Aortic Valve: The aortic valve was not well visualized. Pulmonic Valve: The pulmonic valve was not well visualized. Venous: The inferior vena cava is dilated in size with less than 50% respiratory variability, suggesting right atrial pressure of 15 mmHg. Additional Comments: Spectral Doppler performed. Color Doppler performed.  Eleonore Chiquito MD Electronically signed by Eleonore Chiquito MD Signature Date/Time: 05/12/2022/3:34:18 PM    Final      LOS: 3 days   Signature  -    Lala Lund M.D on 05/14/2022 at 11:31 AM   -  To page go to [email or web address]

## 2022-05-15 DIAGNOSIS — J189 Pneumonia, unspecified organism: Secondary | ICD-10-CM | POA: Diagnosis not present

## 2022-05-15 LAB — BASIC METABOLIC PANEL
Anion gap: 7 (ref 5–15)
BUN: 19 mg/dL (ref 8–23)
CO2: 28 mmol/L (ref 22–32)
Calcium: 8.3 mg/dL — ABNORMAL LOW (ref 8.9–10.3)
Chloride: 102 mmol/L (ref 98–111)
Creatinine, Ser: 0.85 mg/dL (ref 0.61–1.24)
GFR, Estimated: >60 mL/min (ref >60)
Glucose, Bld: 128 mg/dL — ABNORMAL HIGH (ref 70–99)
Potassium: 3.6 mmol/L (ref 3.5–5.1)
Sodium: 137 mmol/L (ref 135–145)

## 2022-05-15 LAB — C-REACTIVE PROTEIN: CRP: 12.8 mg/dL — ABNORMAL HIGH (ref <1.0)

## 2022-05-15 LAB — CBC WITH DIFFERENTIAL/PLATELET
Abs Immature Granulocytes: 0.04 10*3/uL (ref 0.00–0.07)
Basophils Absolute: 0 10*3/uL (ref 0.0–0.1)
Basophils Relative: 0 %
Eosinophils Absolute: 0.4 10*3/uL (ref 0.0–0.5)
Eosinophils Relative: 4 %
HCT: 29 % — ABNORMAL LOW (ref 39.0–52.0)
Hemoglobin: 9.7 g/dL — ABNORMAL LOW (ref 13.0–17.0)
Immature Granulocytes: 0 %
Lymphocytes Relative: 14 %
Lymphs Abs: 1.3 10*3/uL (ref 0.7–4.0)
MCH: 31.7 pg (ref 26.0–34.0)
MCHC: 33.4 g/dL (ref 30.0–36.0)
MCV: 94.8 fL (ref 80.0–100.0)
Monocytes Absolute: 1.6 10*3/uL — ABNORMAL HIGH (ref 0.1–1.0)
Monocytes Relative: 16 %
Neutro Abs: 6.4 10*3/uL (ref 1.7–7.7)
Neutrophils Relative %: 66 %
Platelets: 267 10*3/uL (ref 150–400)
RBC: 3.06 MIL/uL — ABNORMAL LOW (ref 4.22–5.81)
RDW: 13.6 % (ref 11.5–15.5)
WBC: 9.8 10*3/uL (ref 4.0–10.5)
nRBC: 0 % (ref 0.0–0.2)

## 2022-05-15 LAB — BRAIN NATRIURETIC PEPTIDE: B Natriuretic Peptide: 1211.7 pg/mL — ABNORMAL HIGH (ref 0.0–100.0)

## 2022-05-15 LAB — MAGNESIUM: Magnesium: 1.9 mg/dL (ref 1.7–2.4)

## 2022-05-15 LAB — PROCALCITONIN: Procalcitonin: 0.13 ng/mL

## 2022-05-15 MED ORDER — POTASSIUM CHLORIDE CRYS ER 20 MEQ PO TBCR
40.0000 meq | EXTENDED_RELEASE_TABLET | Freq: Once | ORAL | Status: AC
  Administered 2022-05-15: 40 meq via ORAL
  Filled 2022-05-15: qty 2

## 2022-05-15 MED ORDER — SPIRONOLACTONE 25 MG PO TABS
25.0000 mg | ORAL_TABLET | Freq: Once | ORAL | Status: AC
  Administered 2022-05-15: 25 mg via ORAL
  Filled 2022-05-15: qty 1

## 2022-05-15 MED ORDER — PENICILLIN G POTASSIUM 20000000 UNITS IJ SOLR
12.0000 10*6.[IU] | Freq: Two times a day (BID) | INTRAVENOUS | Status: DC
  Administered 2022-05-16 – 2022-05-18 (×5): 12 10*6.[IU] via INTRAVENOUS
  Filled 2022-05-15 (×6): qty 12

## 2022-05-15 NOTE — NC FL2 (Signed)
[place] LEVEL OF CARE FORM     IDENTIFICATION  Patient Name: Douglas Perkins. Birthdate: 06/10/1945 Sex: male Admission Date (Current Location): 05/11/2022  [facility] and [place] Number:  Herbalist and Address:  [facility], [place] [address] [place], [place] [address]      Provider Number: [id number]  Attending Physician Name and Address:  Thurnell Lose, MD  Relative Name and Phone Number:       Current Level of Care: Hospital Recommended Level of Care: [place] Prior Approval Number:    Date Approved/Denied:   PASRR Number: [id number] E, expires 05/20/22  Discharge Plan: SNF    Current Diagnoses: Patient Active Problem List   Diagnosis Date Noted   Multifocal pneumonia 05/11/2022   Acute respiratory failure with hypoxia ([place]) 05/11/2022   Heart failure with preserved ejection fraction ([place]) 05/11/2022   Leukocytosis 05/11/2022   Normocytic anemia 05/11/2022   Cough 05/10/2022   Pacemaker infection ([place]) 04/12/2022   Streptococcal bacteremia 04/12/2022   Sepsis ([place]) 04/10/2022   Prediabetes 03/06/2021   Bipolar affective disorder, current episode manic ([place]) 03/03/2021   Pressure injury of skin 98/33/8250   Acute metabolic encephalopathy 53/97/6734   Hyponatremia 02/18/2021   OSA (obstructive sleep apnea) 11/26/2019   Cardiac pacemaker 04/30/2019   History of sinoatrial node dysfunction 04/30/2019   Other male erectile dysfunction 11/12/2018   Near syncope 05/22/2017   Coronary artery disease involving native coronary artery of native heart 01/10/2017   Low serum testosterone level 06/16/2016   Obesity (BMI 30-39.9) 03/09/2016   Syncope 07/19/2015   SIRS (systemic inflammatory response syndrome) ([place]) 07/07/2015   Bipolar I disorder ([place]) 06/24/2015   Bilateral pneumonia 06/23/2015   Bipolar affective disorder, currently manic, mild ([place]) 06/18/2015   History of duodenal ulcer 06/18/2015    Vaccine counseling 06/18/2015   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   Bradycardia 02/08/2015   Hyperlipidemia, mixed 10/14/2014   AF (amaurosis fugax) 09/16/2014   Benign essential hypertension 09/16/2014   Chest pain 09/16/2014   Skin cyst 08/06/2012   Personal history of lymphoma 08/06/2012   Cancer ([place])     Orientation RESPIRATION BLADDER Height & Weight     Self, Time, Situation, Place  O2 (4L nasal cannula) Continent, External catheter Weight: 270 lb 14.8 oz (122.9 kg) Height:  6' (182.9 cm)  BEHAVIORAL SYMPTOMS/MOOD NEUROLOGICAL BOWEL NUTRITION STATUS      Continent    AMBULATORY STATUS COMMUNICATION OF NEEDS Skin   Extensive Assist Verbally Other (Comment) (MASD on groin)                       Personal Care Assistance Level of Assistance  Bathing, Feeding, Dressing Bathing Assistance: Maximum assistance Feeding assistance: Limited assistance Dressing Assistance: Limited assistance     Functional Limitations Info  Sight, Hearing Sight Info: Impaired Hearing Info: Impaired      SPECIAL CARE FACTORS FREQUENCY  PT (By licensed PT), OT (By licensed OT)     PT Frequency: 5x/week OT Frequency: 5x/week            Contractures Contractures Info: Not present    Additional Factors Info  Code Status, Allergies, Psychotropic Code Status Info: Full Allergies Info: Sulfa Antibiotics, Feldene (Piroxicam) Psychotropic Info: Seroquel         Current Medications (05/15/2022):  This is the current hospital active medication list Current Facility-Administered Medications  Medication Dose Route Frequency Provider Last Rate Last Admin   acetaminophen (TYLENOL) tablet 650 mg  650 mg Oral Q6H PRN Fuller Plan A, MD       Or   acetaminophen (TYLENOL) suppository 650 mg  650 mg Rectal Q6H PRN Fuller Plan A, MD       albuterol (PROVENTIL) (2.5 MG/3ML) 0.083% nebulizer solution 2.5 mg  2.5 mg Nebulization Q2H PRN  Fuller Plan A, MD   2.5 mg at 05/11/22 2206   aspirin EC tablet 81 mg  81 mg Oral Daily Jonetta Osgood, MD   81 mg at 05/15/22 0915   atorvastatin (LIPITOR) tablet 40 mg  40 mg Oral Daily Jonetta Osgood, MD   40 mg at 05/15/22 0917   carvedilol (COREG CR) 24 hr capsule 20 mg  20 mg Oral Daily Jonetta Osgood, MD   20 mg at 05/15/22 0916   ceFEPIme (MAXIPIME) 2 g in sodium chloride 0.9 % 100 mL IVPB  2 g Intravenous Q8H Vu, Trung T, MD 200 mL/hr at 05/15/22 0609 2 g at 05/15/22 1610   Chlorhexidine Gluconate Cloth 2 % PADS 6 each  6 each Topical Daily Kayleen Memos, DO   6 each at 05/15/22 0921   divalproex (DEPAKOTE) DR tablet 1,000 mg  1,000 mg Oral QHS Jonetta Osgood, MD   1,000 mg at 05/14/22 2115   dorzolamide (TRUSOPT) 2 % ophthalmic solution 1 drop  1 drop Right Eye BID Jonetta Osgood, MD   1 drop at 05/15/22 0922   enoxaparin (LOVENOX) injection 50 mg  50 mg Subcutaneous Q24H Smith, Rondell A, MD   50 mg at 05/15/22 0915   furosemide (LASIX) injection 60 mg  60 mg Intravenous Daily Thurnell Lose, MD   60 mg at 05/15/22 0917   gabapentin (NEURONTIN) capsule 300 mg  300 mg Oral BID Jonetta Osgood, MD   300 mg at 05/15/22 0915   hydrALAZINE (APRESOLINE) injection 10 mg  10 mg Intravenous Q4H PRN Smith, Rondell A, MD       latanoprost (XALATAN) 0.005 % ophthalmic solution 1 drop  1 drop Right Eye BID Jonetta Osgood, MD   1 drop at 05/15/22 9604   melatonin tablet 10 mg  10 mg Oral QHS Jonetta Osgood, MD   10 mg at 05/14/22 2115   mometasone-formoterol (DULERA) 100-5 MCG/ACT inhaler 2 puff  2 puff Inhalation BID Jonetta Osgood, MD   2 puff at 05/15/22 0800   pantoprazole (PROTONIX) EC tablet 40 mg  40 mg Oral BID Jonetta Osgood, MD   40 mg at 05/15/22 0915   potassium chloride SA (KLOR-CON M) CR tablet 40 mEq  40 mEq Oral Daily Thurnell Lose, MD   40 mEq at 05/15/22 0917   potassium chloride SA (KLOR-CON M) CR tablet 40 mEq  40 mEq Oral Once  Thurnell Lose, MD       QUEtiapine (SEROQUEL) tablet 300 mg  300 mg Oral QHS Jonetta Osgood, MD   300 mg at 05/14/22 2114   sodium chloride flush (NS) 0.9 % injection 10-40 mL  10-40 mL Intracatheter Q12H Jonetta Osgood, MD   10 mL at 05/15/22 0915   tamsulosin (FLOMAX) capsule 0.4 mg  0.4 mg Oral QHS Jonetta Osgood, MD   0.4 mg at 05/14/22 2116   timolol (TIMOPTIC) 0.5 % ophthalmic solution 1 drop  1 drop Right Eye BID Jonetta Osgood, MD  1 drop at 05/15/22 7591     Discharge Medications: Please see discharge summary for a list of discharge medications.  Relevant Imaging Results:  Relevant Lab Results:   Additional Information SSN: [place], [place]

## 2022-05-15 NOTE — Progress Notes (Signed)
Speech Language Pathology Treatment: Dysphagia  Patient Details Name: Douglas Perkins. MRN: [id number] DOB: [birth year] Today's Date: 05/15/2022 Time: 3094-0768 SLP Time Calculation (min) (ACUTE ONLY): 15 min  Assessment / Plan / Recommendation Clinical Impression  Douglas Perkins stated that Douglas Perkins felt weak today and felt that Douglas Perkins needed to gasp for breath. During presentations of solids and thin liquids, coughing was observed before, during, and after each trial. Question his ability to coordinate breathing, coughing, and swallowing. Persistent coughing complicates clinical picture, but Douglas Perkins stated that Douglas Perkins feels Douglas Perkins is getting choked on food and agreed to instrumental testing. Recommend Dys 3 diet with thin liquids pending MBS results to further determine safe diet. Will be completed on next date at the earliest.    HPI HPI: Douglas Perkins is a 77 y/o male who presented with worsening cough, poor secretion management, and shortness of breath. Admitted with multifocal PNA. Following a recent pacemaker infection, Douglas Perkins was admitted to [facility] 12/22. Prior swallow eval (06/27/15) completed at another hospital was [facility] with no remarkable findings. Douglas Perkins has a hx of prostate and merkel cell cancer, sleep apnea, and TIA. Douglas Perkins uses 3L [place] at baseline.      SLP Plan  MBS;New goals to be determined pending instrumental study      Recommendations for follow up therapy are one component of a multi-disciplinary discharge planning process, led by the attending physician.  Recommendations may be updated based on patient status, additional functional criteria and insurance authorization.    Recommendations  Diet recommendations: Dysphagia 3 (mechanical soft);Thin liquid Liquids provided via: Cup;Straw Medication Administration: Whole meds with liquid Supervision: Patient able to self feed Compensations: Slow rate;Small sips/bites Postural Changes and/or Swallow Maneuvers: Seated upright 90 degrees                Oral Care Recommendations:  Oral care BID Follow Up Recommendations: Skilled nursing-short term rehab (<3 hours/day) Assistance recommended at discharge: Set up Supervision/Assistance SLP Visit Diagnosis: Dysphagia, unspecified (R13.10) Plan: MBS;New goals to be determined pending instrumental study           Fabio Asa, Student SLP  05/15/2022, 5:11 PM

## 2022-05-15 NOTE — Progress Notes (Signed)
PROGRESS NOTE        PATIENT DETAILS Name: Douglas Perkins. Age: 77 y.o. Sex: male Date of Birth: [birth year] Admit Date: 05/11/2022 Admitting Physician Kayleen Memos, DO PCP:Pcp, No  Brief Summary: Patient is a 77 y.o.  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  Significant events: 12/10-12/22>> hospitalization for group B strep bacteremia with pacemaker lead implantation.  Discharged on IV penicillin-end date 1/29. 01/11>> admit to [facility]-hypoxia due to PNA-initially on BiPAP.  Liberated off BiPAP post admission 01/12>> on around 8-10 L of HFNC. 01/13>> Down to 6 L of HFNC  Significant studies: 01/11>> CXR: Right> left infiltrates 01/12>> TTE: EF 55-60%, no obvious vegetation-but limited evaluation due to poor windows.  Significant microbiology data: 1/11>> COVID/influenza/RSV PCR: Negative 1/11>> blood culture: No growth 1/12>> respiratory virus panel: neg  Procedures: 12/15>> TEE: EF 55-60%-no valvular vegetation visualized 12/18>> permanent pacemaker lead explantation-implantation of leadless pacemaker.  Consults: ID  Subjective:   Patient in bed, appears comfortable, denies any headache, no fever, no chest pain or pressure, no shortness of breath , no abdominal pain. No new focal weakness.   Objective: Vitals: Blood pressure (!) 161/60, pulse 77, temperature 98 F (36.7 C), temperature source Oral, resp. rate 16, height 6' (1.829 m), weight 122.9 kg, SpO2 90 %.   Exam:  Awake Alert, No new F.N deficits, chronic right leg atrophy, R Arm PICC [place].AT,PERRAL Supple Neck, No JVD,   Symmetrical Chest wall movement, Good air movement  bilaterally, CTAB RRR,No Gallops, Rubs or new Murmurs,  +ve B.Sounds, Abd Soft, No tenderness,   Trace bipedal edema   Assessment/Plan:  Acute hypoxic respiratory failure due to combination of multifocal PNA and HFpEF exacerbation Hypoxia improving-initially on BiPAP Continue antibiotics/diuretics.  He is improving with diuresis continue Lasix , continue.  SpO2: 90 % O2 Flow Rate (L/min): 4 L/min FiO2 (%): 40 %   Multifocal PNA ID titrating antibiotics.  Clinically stable likely more consistent with CHF.  HFpEF exacerbation Improving leg edema-improving hypoxemia Continue IV Lasix Unfortunately-urine output not documented-have discussed with nursing staff this morning to see if we could start documenting daily weights/intake/output. Monitor electrolytes closely.  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 Was on IV penicillin V through 1/29.  Currently on vancomycin/PM to cover PNA-and penicillin V on hold.  History of CAD No angina-no wall motion abnormality on recent echo Continue aspirin/statin/Coreg  History of symptomatic sinus node dysfunction Now with leadless pacemaker in place. Telemetry monitoring  HTN Continue Coreg-resumed at a much lower dose Continue to hold losartan to allow room for diuresis.    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   Weight as of this encounter:  122.9 kg.   Code status:   Code Status: Full Code    DVT Prophylaxis: Place TED hose Start: 05/15/22 0955Prophylactic Lovenox   Family Communication: Spouse-[phone] updated 05/15/22 over the phone   Disposition Plan: Status is: Inpatient Remains inpatient appropriate because: Severity of illness   Planned Discharge Destination:Skilled nursing facility   Diet: Diet Order             Diet Heart Room service appropriate? Yes; Fluid consistency: Thin; Fluid restriction: 1500 mL Fluid  Diet effective now                    MEDICATIONS: Scheduled Meds:  aspirin EC  81 mg Oral Daily   atorvastatin  40 mg Oral Daily   carvedilol  20 mg Oral Daily   Chlorhexidine Gluconate Cloth  6 each Topical Daily   divalproex  1,000 mg Oral QHS   dorzolamide  1 drop Right Eye BID   enoxaparin (LOVENOX) injection  50 mg Subcutaneous Q24H   furosemide  60 mg Intravenous Daily   gabapentin  300 mg Oral BID   latanoprost  1 drop Right Eye BID   melatonin  10 mg Oral QHS   mometasone-formoterol  2 puff Inhalation BID   pantoprazole  40 mg Oral BID   potassium chloride  40 mEq Oral Daily   potassium chloride  40 mEq Oral Once   QUEtiapine  300 mg Oral QHS   sodium chloride flush  10-40 mL Intracatheter Q12H   spironolactone  25 mg Oral Once   tamsulosin  0.4 mg Oral QHS   timolol  1 drop Right Eye BID   Continuous Infusions:  ceFEPime (MAXIPIME) IV 2 g (05/15/22 0609)   PRN Meds:.acetaminophen **OR** acetaminophen, albuterol, hydrALAZINE   I have personally reviewed following labs and imaging studies  LABORATORY DATA:  Recent Labs  Lab 05/11/22 0301 05/11/22 0410 05/12/22 0604 05/13/22 0201 05/14/22 0455 05/15/22 0442  WBC 11.8*  --  12.5* 12.3* 9.2 9.8  HGB 10.6* 10.9* 10.5* 10.5* 9.8* 9.7*  HCT 32.9* 32.0* 30.7* 30.5* 29.5* 29.0*  PLT 241  --  248 240 259 267  MCV 96.8  --  92.2 93.3 94.2 94.8  MCH 31.2  --  31.5 32.1 31.3 31.7  MCHC 32.2  --  34.2 34.4 33.2 33.4  RDW 13.6  --  13.7 13.7 13.7 13.6   LYMPHSABS 0.8  --   --   --  1.7 1.3  MONOABS 1.3*  --   --   --  1.6* 1.6*  EOSABS 0.0  --   --   --  0.5 0.4  BASOSABS 0.0  --   --   --  0.0 0.0    Recent Labs  Lab 05/11/22 0301 05/11/22 0313 05/11/22 0410 05/12/22 0604 05/13/22 0201 05/14/22 0455 05/15/22 0442  NA 131*  --  132* 135 133* 135 137  K 4.7  --  4.7 3.5 3.0* 3.3* 3.6  CL 100  --   --  99 96* 99 102  CO2 23  --   --  '23 27 24 28  '$ ANIONGAP 8  --   --  '13 10 12 7  '$ GLUCOSE 143*  --   --  120* 136* 116* 128*  BUN 13  --   --  14 13 24* 19  CREATININE 0.70  --   --  0.70 0.74 1.06 0.85  CRP  --   --   --   --   --  15.5* 12.8*  PROCALCITON 0.10  --   --   --   --  0.16 0.13  BNP  --  1,118.5*  --   --   --  1,215.6* 1,211.7*  MG  --   --   --   --   --  1.9 1.9  CALCIUM 8.2*  --   --  7.9* 7.9* 7.9* 8.3*      RADIOLOGY STUDIES/RESULTS: DG Chest Port 1 View  Result Date: 05/14/2022 CLINICAL DATA:  Short of breath EXAM: PORTABLE CHEST 1 VIEW COMPARISON:  Prior chest x-ray yesterday FINDINGS: Stable cardiomegaly. Atherosclerotic calcifications present in the transverse aorta. Generator pack overlies the right chest with electrical lead extending cephalad. Persistent pulmonary vascular congestion. Overall, slight interval improvement in aeration with decreased interstitial airspace opacities. Persistent patchy foci of airspace opacity in the right upper lung, right lung base and left mid lung. IMPRESSION: 1. Overall, slight interval improvement in aeration of the lungs bilaterally likely reflecting a decrease in the pulmonary edema component. 2. Persistent multifocal patchy airspace opacities concerning for multifocal pneumonia. 3. Stable cardiomegaly. Electronically Signed   By: Jacqulynn Cadet M.D.   On: 05/14/2022 08:29     LOS: 4 days   Signature  -    Lala Lund M.D on 05/15/2022 at 9:54 AM   -  To page go to [email or web address]

## 2022-05-15 NOTE — Plan of Care (Signed)
  Problem: Education: Goal: Knowledge of cardiac device and self-care will improve Outcome: Progressing Goal: Ability to safely manage health related needs after discharge will improve Outcome: Progressing Goal: Individualized Educational Video(s) Outcome: Progressing   Problem: Cardiac: Goal: Ability to achieve and maintain adequate cardiopulmonary perfusion will improve Outcome: Progressing   Problem: Education: Goal: Ability to demonstrate management of disease process will improve Outcome: Progressing Goal: Ability to verbalize understanding of medication therapies will improve Outcome: Progressing Goal: Individualized Educational Video(s) Outcome: Progressing   Problem: Activity: Goal: Capacity to carry out activities will improve Outcome: Progressing   Problem: Cardiac: Goal: Ability to achieve and maintain adequate cardiopulmonary perfusion will improve Outcome: Progressing   Problem: Education: Goal: Knowledge of General Education information will improve Description: Including pain rating scale, medication(s)/side effects and non-pharmacologic comfort measures Outcome: Progressing   Problem: Health Behavior/Discharge Planning: Goal: Ability to manage health-related needs will improve Outcome: Progressing   Problem: Clinical Measurements: Goal: Ability to maintain clinical measurements within normal limits will improve Outcome: Progressing Goal: Will remain free from infection Outcome: Progressing Goal: Diagnostic test results will improve Outcome: Progressing Goal: Respiratory complications will improve Outcome: Progressing Goal: Cardiovascular complication will be avoided Outcome: Progressing   Problem: Activity: Goal: Risk for activity intolerance will decrease Outcome: Progressing   Problem: Nutrition: Goal: Adequate nutrition will be maintained Outcome: Progressing   Problem: Coping: Goal: Level of anxiety will decrease Outcome: Progressing    Problem: Elimination: Goal: Will not experience complications related to bowel motility Outcome: Progressing Goal: Will not experience complications related to urinary retention Outcome: Progressing   Problem: Pain Managment: Goal: General experience of comfort will improve Outcome: Progressing   Problem: Safety: Goal: Ability to remain free from injury will improve Outcome: Progressing   Problem: Skin Integrity: Goal: Risk for impaired skin integrity will decrease Outcome: Progressing

## 2022-05-16 ENCOUNTER — Inpatient Hospital Stay (HOSPITAL_COMMUNITY): Payer: Medicare Other

## 2022-05-16 DIAGNOSIS — J189 Pneumonia, unspecified organism: Secondary | ICD-10-CM | POA: Diagnosis not present

## 2022-05-16 LAB — BASIC METABOLIC PANEL
Anion gap: 8 (ref 5–15)
BUN: 15 mg/dL (ref 8–23)
CO2: 31 mmol/L (ref 22–32)
Calcium: 8.6 mg/dL — ABNORMAL LOW (ref 8.9–10.3)
Chloride: 101 mmol/L (ref 98–111)
Creatinine, Ser: 0.71 mg/dL (ref 0.61–1.24)
GFR, Estimated: >60 mL/min (ref >60)
Glucose, Bld: 117 mg/dL — ABNORMAL HIGH (ref 70–99)
Potassium: 4.1 mmol/L (ref 3.5–5.1)
Sodium: 140 mmol/L (ref 135–145)

## 2022-05-16 LAB — PROCALCITONIN: Procalcitonin: 0.12 ng/mL

## 2022-05-16 LAB — CBC WITH DIFFERENTIAL/PLATELET
Abs Immature Granulocytes: 0.03 10*3/uL (ref 0.00–0.07)
Basophils Absolute: 0 10*3/uL (ref 0.0–0.1)
Basophils Relative: 0 %
Eosinophils Absolute: 0.2 10*3/uL (ref 0.0–0.5)
Eosinophils Relative: 2 %
HCT: 29.1 % — ABNORMAL LOW (ref 39.0–52.0)
Hemoglobin: 9.1 g/dL — ABNORMAL LOW (ref 13.0–17.0)
Immature Granulocytes: 0 %
Lymphocytes Relative: 17 %
Lymphs Abs: 1.6 10*3/uL (ref 0.7–4.0)
MCH: 30.6 pg (ref 26.0–34.0)
MCHC: 31.3 g/dL (ref 30.0–36.0)
MCV: 98 fL (ref 80.0–100.0)
Monocytes Absolute: 1.5 10*3/uL — ABNORMAL HIGH (ref 0.1–1.0)
Monocytes Relative: 16 %
Neutro Abs: 6.3 10*3/uL (ref 1.7–7.7)
Neutrophils Relative %: 65 %
Platelets: 287 10*3/uL (ref 150–400)
RBC: 2.97 MIL/uL — ABNORMAL LOW (ref 4.22–5.81)
RDW: 13.9 % (ref 11.5–15.5)
WBC: 9.7 10*3/uL (ref 4.0–10.5)
nRBC: 0 % (ref 0.0–0.2)

## 2022-05-16 LAB — C-REACTIVE PROTEIN: CRP: 11.7 mg/dL — ABNORMAL HIGH (ref <1.0)

## 2022-05-16 LAB — MAGNESIUM: Magnesium: 1.8 mg/dL (ref 1.7–2.4)

## 2022-05-16 LAB — CULTURE, BLOOD (ROUTINE X 2): Culture: NO GROWTH

## 2022-05-16 LAB — BRAIN NATRIURETIC PEPTIDE: B Natriuretic Peptide: 1276.1 pg/mL — ABNORMAL HIGH (ref 0.0–100.0)

## 2022-05-16 MED ORDER — SPIRONOLACTONE 25 MG PO TABS
25.0000 mg | ORAL_TABLET | Freq: Once | ORAL | Status: AC
  Administered 2022-05-16: 25 mg via ORAL
  Filled 2022-05-16: qty 1

## 2022-05-16 MED ORDER — POTASSIUM CHLORIDE CRYS ER 20 MEQ PO TBCR
40.0000 meq | EXTENDED_RELEASE_TABLET | Freq: Once | ORAL | Status: AC
  Administered 2022-05-16: 40 meq via ORAL
  Filled 2022-05-16: qty 2

## 2022-05-16 MED ORDER — FUROSEMIDE 10 MG/ML IJ SOLN: 60.0000 mg | Freq: Two times a day (BID) | INTRAMUSCULAR | Status: DC

## 2022-05-16 MED ORDER — FUROSEMIDE 10 MG/ML IJ SOLN
60.0000 mg | Freq: Two times a day (BID) | INTRAMUSCULAR | Status: DC
  Administered 2022-05-16: 60 mg via INTRAVENOUS
  Filled 2022-05-16: qty 6

## 2022-05-16 NOTE — Progress Notes (Signed)
Pt taken off BIPAP this am and immediately had increased WOB with expiratory wheezing. RT pulled PRN albuterol and it helped the pt, but WOB was still seen. RT placed pt back on BIPAP, WOB decreased and RR came down. RT will monitor.

## 2022-05-16 NOTE — Progress Notes (Signed)
Physical Therapy Treatment Patient Details Name: Douglas Perkins. MRN: [id number] DOB: 07-Dec-1945 Today's Date: 05/16/2022   History of Present Illness Pt is a 77 y/o M admitted on 1/11 for SOB. 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 Pt on 3L  at baseline. 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    PT Comments    Pt with poor tolerance of today's session, fatiguing with bed mobility, unable to sit EOB >10 seconds before returning to bed. Pt's wife present throughout session and reports pt has been "rambling", noted to be repeating phrases during today's session but redirected when encouraged to focus on pursed lip breathing, also noted to help improve pt's WOB. Pt participated briefly in supine TherEx, but requiring visual, verbal, and tactile cues to perform exercises with proper form. Pt requiring modA for all bed mobility today with hand over hand utilized to assist pt in use of bed rails for rolling. Pt will continue to benefit from skilled acute PT at this time to progress mobility and activity tolerance, continue to recommend current discharge plans.     Recommendations for follow up therapy are one component of a multi-disciplinary discharge planning process, led by the attending physician.  Recommendations may be updated based on patient status, additional functional criteria and insurance authorization.  Follow Up Recommendations  Skilled nursing-short term rehab (<3 hours/day) Can patient physically be transported by private vehicle: No   Assistance Recommended at Discharge Frequent or constant Supervision/Assistance  Patient can return home with the following A lot of help with walking and/or transfers;A lot of help with bathing/dressing/bathroom;Assistance with cooking/housework;Direct supervision/assist for medications management;Direct  supervision/assist for financial management;Assist for transportation;Help with stairs or ramp for entrance   Equipment Recommendations  Other (comment) (defer to next level of care)    Recommendations for Other Services       Precautions / Restrictions Precautions Precautions: Fall;ICD/Pacemaker Precaution Comments: recent extraction of PPM and replacement with leadless PPM on 12/18 Restrictions Weight Bearing Restrictions: No     Mobility  Bed Mobility Overal bed mobility: Needs Assistance Bed Mobility: Rolling, Supine to Sit, Sit to Supine Rolling: Mod assist   Supine to sit: HOB elevated, Mod assist Sit to supine: HOB elevated, Mod assist   General bed mobility comments: pt requires increased cueing and assistance for use of siderail and sequencing to roll. Not achieveing full supine>sit as pt propped on R elbow and then returning himself to bed 2/2 fatigue    Transfers                        Ambulation/Gait                   Stairs             Wheelchair Mobility    Modified Rankin (Stroke Patients Only)       Balance Overall balance assessment: Needs assistance Sitting-balance support: Bilateral upper extremity supported Sitting balance-Leahy Scale: Poor Sitting balance - Comments: poor sitting balance due to fatigue, leaning to the R and then attempting to return to supine                                    Cognition Arousal/Alertness: Lethargic Behavior During Therapy: Restless Overall Cognitive Status:  Impaired/Different from baseline Area of Impairment: Following commands, Safety/judgement                       Following Commands: Follows one step commands inconsistently Safety/Judgement: Decreased awareness of safety, Decreased awareness of deficits     General Comments: pt intermittently following commands, repeating phrases and requiring cueing to deep breathe when noticed increased WOB. Pt's  wife at bedside reports he has been rambling, RN notified and aware        Exercises General Exercises - Lower Extremity Ankle Circles/Pumps: AROM, Both, 10 reps, Supine Heel Slides: AROM, Both, 5 reps, Supine    General Comments General comments (skin integrity, edema, etc.): brief desat to 87% on 4L O2 HFNC with mobility, recovering with cueing for deep breathing and rest in supine      Pertinent Vitals/Pain Pain Assessment Pain Assessment: No/denies pain    Home Living                          Prior Function            PT Goals (current goals can now be found in the care plan section) Acute Rehab PT Goals Patient Stated Goal: get better PT Goal Formulation: With patient Time For Goal Achievement: 05/26/22 Potential to Achieve Goals: Fair Progress towards PT goals: Progressing toward goals    Frequency    Min 2X/week      PT Plan Current plan remains appropriate    Co-evaluation              AM-PAC PT "6 Clicks" Mobility   Outcome Measure  Help needed turning from your back to your side while in a flat bed without using bedrails?: A Lot Help needed moving from lying on your back to sitting on the side of a flat bed without using bedrails?: A Lot Help needed moving to and from a bed to a chair (including a wheelchair)?: A Lot Help needed standing up from a chair using your arms (e.g., wheelchair or bedside chair)?: A Lot Help needed to walk in hospital room?: Total Help needed climbing 3-5 steps with a railing? : Total 6 Click Score: 10    End of Session Equipment Utilized During Treatment: Oxygen Activity Tolerance: Patient limited by fatigue Patient left: in bed;with call bell/phone within reach;with bed alarm set;with family/visitor present Nurse Communication: Mobility status;Other (comment) (wife reports pt has been "rambling" which is not his baseline, RN notified) PT Visit Diagnosis: Other abnormalities of gait and mobility  (R26.89);Muscle weakness (generalized) (M62.81)     Time: 5170-0174 PT Time Calculation (min) (ACUTE ONLY): 16 min  Charges:  $Therapeutic Activity: 8-22 mins                     Charlynne Cousins, PT DPT Acute Rehabilitation Services Office [phone]    Luvenia Heller 05/16/2022, 3:39 PM

## 2022-05-16 NOTE — Progress Notes (Incomplete)
   Heart Failure Stewardship Pharmacist Progress Note   PCP: Pcp, No PCP-Cardiologist: None    HPI:  ***  Current HF Medications: {HF MEDS CURRENT:26665}  Prior to admission HF Medications: {HF MEDS PTA:26664}  Pertinent Lab Values: Serum creatinine ***, BUN ***, Potassium ***, Sodium ***, BNP ***, Magnesium ***, A1c ***, Digoxin ***   Vital Signs: Weight: *** lbs (admission weight: *** lbs) Blood pressure: ***  Heart rate: ***  I/O: -***L yesterday; net -***L  Medication Assistance / Insurance Benefits Check: Does the patient have prescription insurance?  {Yes/No/Pending:24180} Type of insurance plan: ***  Does the patient qualify for medication assistance through manufacturers or grants?   {Yes/No/Pending:24180} Eligible grants and/or patient assistance programs: *** Medication assistance applications in progress: ***  Medication assistance applications approved: *** Approved medication assistance renewals will be completed by: ***  Outpatient Pharmacy:  Prior to admission outpatient pharmacy: *** Is the patient willing to use [facility] at discharge? {Yes/No/Pending:24180} Is the patient willing to transition their outpatient pharmacy to utilize a [facility] outpatient pharmacy?   {Yes/No/Pending:24180}    Assessment: 1. Acute on ***chronic ***systolic CHF (LVEF ***%), due to ***. NYHA class *** symptoms. -    Plan: 1) Medication changes recommended at this time: -  2) Patient assistance: -  3)  Education  - To be completed prior to discharge  Kerby Nora, PharmD, BCPS Heart Failure Stewardship Pharmacist Phone [phone]

## 2022-05-16 NOTE — Progress Notes (Signed)
PROGRESS NOTE        PATIENT DETAILS Name: Douglas Perkins. Age: 77 y.o. Sex: male Date of Birth: [birth year] Admit Date: 05/11/2022 Admitting Physician Kayleen Memos, DO PCP:Pcp, No  Brief Summary: Patient is a 77 y.o.  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  Significant events: 12/10-12/22>> hospitalization for group B strep bacteremia with pacemaker lead implantation.  Discharged on IV penicillin-end date 1/29. 01/11>> admit to [facility]-hypoxia due to PNA-initially on BiPAP.  Liberated off BiPAP post admission 01/12>> on around 8-10 L of HFNC. 01/13>> Down to 6 L of HFNC  Significant studies: 01/11>> CXR: Right> left infiltrates 01/12>> TTE: EF 55-60%, no obvious vegetation-but limited evaluation due to poor windows.  Significant microbiology data: 1/11>> COVID/influenza/RSV PCR: Negative 1/11>> blood culture: No growth 1/12>> respiratory virus panel: neg  Procedures: 12/15>> TEE: EF 55-60%-no valvular vegetation visualized 12/18>> permanent pacemaker lead explantation-implantation of leadless pacemaker.  Consults: ID  Subjective:   Patient in bed, appears comfortable, denies any headache, no fever, no chest pain or pressure, no shortness of breath , no abdominal pain. No new focal weakness.   Objective: Vitals: Blood pressure (!) 102/46, pulse 75, temperature 97.7 F (36.5 C), temperature source Oral, resp. rate (!) 24, height 6' (1.829 m), weight 122.4 kg, SpO2 96 %.   Exam:  Awake Alert, No new F.N deficits, chronic right leg atrophy, R Arm PICC [place].AT,PERRAL Supple Neck, No JVD,   Symmetrical Chest wall movement, Good air movement  bilaterally, CTAB RRR,No Gallops, Rubs or new Murmurs,  +ve B.Sounds, Abd Soft, No tenderness,   No Cyanosis, Clubbing or edema     Assessment/Plan:  Acute hypoxic respiratory failure due to combination of multifocal PNA and HFpEF exacerbation Hypoxia improving-initially on BiPAP Continue antibiotics/diuretics.  He is improving with diuresis continue Lasix - dose increased further , continue.  SpO2: 96 % O2 Flow Rate (L/min): 7 L/min FiO2 (%): 50 %   Multifocal PNA ID titrating antibiotics.  Clinically stable likely more consistent with CHF.  HFpEF exacerbation Improving leg edema-improving hypoxemia Continue IV Lasix Unfortunately-urine output not documented-have discussed with nursing staff this morning to see if we could start documenting daily weights/intake/output. Monitor electrolytes closely.  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 Was on IV penicillin V through 1/29.  Currently on vancomycin/PM to cover PNA-and penicillin V on hold.  History of CAD No angina-no wall motion abnormality on recent echo Continue aspirin/statin/Coreg  History of symptomatic sinus node dysfunction Now with leadless pacemaker in place. Telemetry monitoring  HTN Continue Coreg-resumed at a much lower dose Continue to hold losartan to allow room for diuresis.    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   Weight as of this encounter: 122.4 kg.    Code status:   Code Status: Full Code   DVT Prophylaxis: Place TED hose Start: 05/15/22 0955Prophylactic Lovenox   Family Communication: Spouse-Frances-[phone] updated 05/15/22 over the phone   Disposition Plan: Status is: Inpatient Remains inpatient appropriate because: Severity of illness   Planned Discharge Destination:Skilled nursing facility   Diet: Diet Order             DIET DYS 3 Room service appropriate? Yes with Assist; Fluid consistency: Thin; Fluid restriction: 1500 mL Fluid  Diet effective now                    MEDICATIONS: Scheduled Meds:  aspirin EC  81 mg Oral Daily   atorvastatin  40 mg Oral Daily   carvedilol  20 mg Oral Daily   Chlorhexidine Gluconate Cloth  6 each Topical Daily   divalproex  1,000 mg Oral QHS   dorzolamide  1 drop Right Eye BID   enoxaparin (LOVENOX) injection  50 mg Subcutaneous Q24H   furosemide  60 mg Intravenous Q12H   gabapentin  300 mg Oral BID   latanoprost  1 drop Right Eye BID   melatonin  10 mg Oral QHS   mometasone-formoterol  2 puff Inhalation BID   pantoprazole  40 mg Oral BID   potassium chloride  40 mEq Oral Once   QUEtiapine  300 mg Oral QHS   sodium chloride flush  10-40 mL Intracatheter Q12H   spironolactone  25 mg Oral Once   tamsulosin  0.4 mg Oral QHS   timolol  1 drop Right Eye BID   Continuous Infusions:  penicillin G potassium 12 Million Units in dextrose 5 % 500 mL continuous infusion 12 Million Units (05/16/22 0907)   PRN Meds:.acetaminophen **OR** acetaminophen, albuterol, hydrALAZINE   I have personally reviewed following labs and imaging studies  LABORATORY DATA:  Recent Labs  Lab 05/11/22 0301 05/11/22 0410 05/12/22 0604 05/13/22 0201 05/14/22 0455 05/15/22 0442 05/16/22 0514  WBC 11.8*  --  12.5* 12.3* 9.2 9.8 9.7  HGB 10.6*   < > 10.5* 10.5* 9.8* 9.7* 9.1*  HCT 32.9*   < > 30.7* 30.5* 29.5* 29.0* 29.1*  PLT 241  --  248 240 259 267 287  MCV 96.8  --  92.2 93.3 94.2  94.8 98.0  MCH 31.2  --  31.5 32.1 31.3 31.7 30.6  MCHC 32.2  --  34.2 34.4 33.2 33.4 31.3  RDW 13.6  --  13.7 13.7 13.7 13.6 13.9  LYMPHSABS 0.8  --   --   --  1.7 1.3 1.6  MONOABS 1.3*  --   --   --  1.6* 1.6* 1.5*  EOSABS 0.0  --   --   --  0.5 0.4 0.2  BASOSABS 0.0  --   --   --  0.0 0.0 0.0   < > = values in this interval not displayed.    Recent Labs  Lab 05/11/22 0301 05/11/22 0313 05/11/22 0410 05/12/22 0604 05/13/22 0201 05/14/22 0455 05/15/22 0442 05/16/22 0514  NA 131*  --    < > 135 133* 135 137 140  K 4.7  --    < > 3.5 3.0* 3.3* 3.6 4.1  CL 100  --   --  99 96* 99 102 101  CO2 23  --   --  '23 27 24 28 31  '$ ANIONGAP 8  --   --  $'13 10 12 7 8  'X$ GLUCOSE 143*  --   --  120* 136* 116* 128* 117*  BUN 13  --   --  14 13 24* 19 15  CREATININE 0.70  --   --  0.70 0.74 1.06 0.85 0.71  CRP  --   --   --   --   --  15.5* 12.8* 11.7*  PROCALCITON 0.10  --   --   --   --  0.16 0.13 0.12  BNP  --  1,118.5*  --   --   --  1,215.6* 1,211.7* 1,276.1*  MG  --   --   --   --   --  1.9 1.9 1.8  CALCIUM 8.2*  --   --  7.9* 7.9* 7.9* 8.3* 8.6*   < > = values in this interval not displayed.      RADIOLOGY STUDIES/RESULTS: No results found.   LOS: 5 days   Signature  -    Lala Lund M.D on 05/16/2022 at 9:21 AM   -  To page go to [email or web address]

## 2022-05-16 NOTE — Plan of Care (Signed)
  Problem: Education: Goal: Knowledge of cardiac device and self-care will improve Outcome: Progressing Goal: Ability to safely manage health related needs after discharge will improve Outcome: Progressing Goal: Individualized Educational Video(s) Outcome: Progressing   Problem: Cardiac: Goal: Ability to achieve and maintain adequate cardiopulmonary perfusion will improve Outcome: Progressing   Problem: Education: Goal: Ability to demonstrate management of disease process will improve Outcome: Progressing Goal: Ability to verbalize understanding of medication therapies will improve Outcome: Progressing Goal: Individualized Educational Video(s) Outcome: Progressing   Problem: Activity: Goal: Capacity to carry out activities will improve Outcome: Progressing   Problem: Cardiac: Goal: Ability to achieve and maintain adequate cardiopulmonary perfusion will improve Outcome: Progressing   Problem: Education: Goal: Knowledge of General Education information will improve Description: Including pain rating scale, medication(s)/side effects and non-pharmacologic comfort measures Outcome: Progressing   Problem: Health Behavior/Discharge Planning: Goal: Ability to manage health-related needs will improve Outcome: Progressing   Problem: Clinical Measurements: Goal: Ability to maintain clinical measurements within normal limits will improve Outcome: Progressing Goal: Will remain free from infection Outcome: Progressing Goal: Diagnostic test results will improve Outcome: Progressing Goal: Respiratory complications will improve Outcome: Progressing Goal: Cardiovascular complication will be avoided Outcome: Progressing   Problem: Activity: Goal: Risk for activity intolerance will decrease Outcome: Progressing   Problem: Nutrition: Goal: Adequate nutrition will be maintained Outcome: Progressing   Problem: Coping: Goal: Level of anxiety will decrease Outcome: Progressing    Problem: Elimination: Goal: Will not experience complications related to bowel motility Outcome: Progressing Goal: Will not experience complications related to urinary retention Outcome: Progressing   Problem: Pain Managment: Goal: General experience of comfort will improve Outcome: Progressing   Problem: Safety: Goal: Ability to remain free from injury will improve Outcome: Progressing   Problem: Skin Integrity: Goal: Risk for impaired skin integrity will decrease Outcome: Progressing

## 2022-05-16 NOTE — Progress Notes (Signed)
Modified Barium Swallow Progress Note  Patient Details  Name: Douglas Perkins. MRN: [id number] Date of Birth: 1946/05/01  Today's Date: 05/16/2022  Modified Barium Swallow completed.  Full report located under Chart Review in the Imaging Section.  Brief recommendations include the following:  Clinical Impression  Pt presents with a mild oropharyngeal dysphagia that appears to be exacerbated by current respiratory status and mentation. Orally he sometimes plays with boluses in his mouth, rocking them with his tongue before swallowing them. He leaves mild lingual residue behind but he often swallows spontaneously to clear it. His overall pharyngeal function is relatively intact but as he starts to become more short of breath and more impulsive, his swallow becomes less organized and timely. This leads to moments of aspiration with thin liquids (PAS 7), which are not eliminated by use of a chin tuck (PAS 8). When physically assisted to take single sips he has improving airway protection (PAS 2), but he can protect his airway well with nectar thick liquids (PAS 2) regardless of any pacing or external assist. Considering the above, recommend Dys 3 diet and nectar thick liquids with hopeful ability to resume thin liquids as respiratory status improves.   Swallow Evaluation Recommendations       SLP Diet Recommendations: Dysphagia 3 (Mech soft) solids;Nectar thick liquid   Liquid Administration via: Cup;Straw   Medication Administration: Whole meds with puree   Supervision: Patient able to self feed;Intermittent supervision to cue for compensatory strategies;Comment (set- up assist for safety)   Compensations: Slow rate;Small sips/bites;Other (Comment) (take breaks PRN for breathing)   Postural Changes: Seated upright at 90 degrees   Oral Care Recommendations: Oral care BID   Other Recommendations: Prohibited food (jello, ice cream, thin soups);Remove water pitcher    [name]., M.A.  [place] Office [phone]  Secure chat preferred  05/16/2022,2:34 PM

## 2022-05-16 NOTE — Progress Notes (Signed)
Speech Language Pathology Treatment: Dysphagia  Patient Details Name: Douglas Perkins. MRN: [id number] DOB: Feb 22, 1946 Today's Date: 05/16/2022 Time: 5697-9480 SLP Time Calculation (min) (ACUTE ONLY): 20 min  Assessment / Plan / Recommendation Clinical Impression  Pt was seen in radiology suite prior to completion of MBS to determine readiness to still proceed with testing. Note that he initially was not receiving supplemental O2 and SpO2 was observed to be as low as 84% with heavy WOB. Pt was reconnected to 7L O2 and given cues to focus on breathing. SpO2 returned to normal levels and WOB returned to what appeared to be his baseline. Still question pt's overall respiratory status and likely effect on coordination of breathing, coughing, and swallowing. Coughing was noted before, during, and after presentations of thin liquids and ice chips.  Pt was very eager to drink and required moderate verbal cueing to use a slow rate while drinking and during trials of ice chips. Pt appropriate to proceed with MBS.    HPI HPI: Pt is a 77 y/o male who presented with worsening cough, poor secretion management, and shortness of breath. Admitted with multifocal PNA. Following a recent pacemaker infection, pt was admitted to [facility] 12/22. Prior swallow eval (06/27/15) completed at another hospital was [facility] with no remarkable findings. He has a hx of prostate and merkel cell cancer, sleep apnea, and TIA. Pt uses 3L [place] at baseline.      SLP Plan  MBS;New goals to be determined pending instrumental study      Recommendations for follow up therapy are one component of a multi-disciplinary discharge planning process, led by the attending physician.  Recommendations may be updated based on patient status, additional functional criteria and insurance authorization.    Recommendations  Diet recommendations: Dysphagia 3 (mechanical soft);Thin liquid Liquids provided via: Cup;Straw Medication Administration: Whole meds  with liquid Supervision: Patient able to self feed Compensations: Slow rate;Small sips/bites Postural Changes and/or Swallow Maneuvers: Seated upright 90 degrees                Oral Care Recommendations: Oral care BID Follow Up Recommendations: Skilled nursing-short term rehab (<3 hours/day) Assistance recommended at discharge: Set up Supervision/Assistance SLP Visit Diagnosis: Dysphagia, unspecified (R13.10) Plan: MBS;New goals to be determined pending instrumental study          Fabio Asa., Student SLP  05/16/2022, 1:21 PM

## 2022-05-17 ENCOUNTER — Inpatient Hospital Stay (HOSPITAL_COMMUNITY): Payer: Medicare Other

## 2022-05-17 ENCOUNTER — Inpatient Hospital Stay: Payer: Self-pay

## 2022-05-17 DIAGNOSIS — J189 Pneumonia, unspecified organism: Secondary | ICD-10-CM | POA: Diagnosis not present

## 2022-05-17 LAB — BASIC METABOLIC PANEL
Anion gap: 10 (ref 5–15)
BUN: 15 mg/dL (ref 8–23)
CO2: 34 mmol/L — ABNORMAL HIGH (ref 22–32)
Calcium: 8.8 mg/dL — ABNORMAL LOW (ref 8.9–10.3)
Chloride: 97 mmol/L — ABNORMAL LOW (ref 98–111)
Creatinine, Ser: 0.79 mg/dL (ref 0.61–1.24)
GFR, Estimated: >60 mL/min (ref >60)
Glucose, Bld: 117 mg/dL — ABNORMAL HIGH (ref 70–99)
Potassium: 3.8 mmol/L (ref 3.5–5.1)
Sodium: 141 mmol/L (ref 135–145)

## 2022-05-17 LAB — CBC WITH DIFFERENTIAL/PLATELET
Abs Immature Granulocytes: 0.05 10*3/uL (ref 0.00–0.07)
Basophils Absolute: 0 10*3/uL (ref 0.0–0.1)
Basophils Relative: 0 %
Eosinophils Absolute: 0.3 10*3/uL (ref 0.0–0.5)
Eosinophils Relative: 3 %
HCT: 28.9 % — ABNORMAL LOW (ref 39.0–52.0)
Hemoglobin: 9.4 g/dL — ABNORMAL LOW (ref 13.0–17.0)
Immature Granulocytes: 1 %
Lymphocytes Relative: 17 %
Lymphs Abs: 1.8 10*3/uL (ref 0.7–4.0)
MCH: 30.7 pg (ref 26.0–34.0)
MCHC: 32.5 g/dL (ref 30.0–36.0)
MCV: 94.4 fL (ref 80.0–100.0)
Monocytes Absolute: 1.7 10*3/uL — ABNORMAL HIGH (ref 0.1–1.0)
Monocytes Relative: 17 %
Neutro Abs: 6.5 10*3/uL (ref 1.7–7.7)
Neutrophils Relative %: 62 %
Platelets: 302 10*3/uL (ref 150–400)
RBC: 3.06 MIL/uL — ABNORMAL LOW (ref 4.22–5.81)
RDW: 13.6 % (ref 11.5–15.5)
WBC: 10.4 10*3/uL (ref 4.0–10.5)
nRBC: 0 % (ref 0.0–0.2)

## 2022-05-17 LAB — MAGNESIUM: Magnesium: 1.6 mg/dL — ABNORMAL LOW (ref 1.7–2.4)

## 2022-05-17 LAB — PROCALCITONIN: Procalcitonin: 0.17 ng/mL

## 2022-05-17 LAB — BRAIN NATRIURETIC PEPTIDE: B Natriuretic Peptide: 1120.9 pg/mL — ABNORMAL HIGH (ref 0.0–100.0)

## 2022-05-17 LAB — C-REACTIVE PROTEIN: CRP: 12.6 mg/dL — ABNORMAL HIGH (ref <1.0)

## 2022-05-17 MED ORDER — SPIRONOLACTONE 25 MG PO TABS
25.0000 mg | ORAL_TABLET | Freq: Once | ORAL | Status: AC
  Administered 2022-05-17: 25 mg via ORAL
  Filled 2022-05-17: qty 1

## 2022-05-17 MED ORDER — POTASSIUM CHLORIDE CRYS ER 20 MEQ PO TBCR
40.0000 meq | EXTENDED_RELEASE_TABLET | Freq: Once | ORAL | Status: AC
  Administered 2022-05-17: 40 meq via ORAL
  Filled 2022-05-17: qty 2

## 2022-05-17 MED ORDER — MAGNESIUM SULFATE 4 GM/100ML IV SOLN
4.0000 g | Freq: Once | INTRAVENOUS | Status: AC
  Administered 2022-05-17: 4 g via INTRAVENOUS
  Filled 2022-05-17: qty 100

## 2022-05-17 MED ORDER — FUROSEMIDE 10 MG/ML IJ SOLN
60.0000 mg | Freq: Two times a day (BID) | INTRAMUSCULAR | Status: AC
  Administered 2022-05-17 (×2): 60 mg via INTRAVENOUS
  Filled 2022-05-17 (×2): qty 6

## 2022-05-17 NOTE — Progress Notes (Signed)
OT Cancellation Note  Patient Details Name: Douglas Perkins. MRN: [id number] DOB: 04/05/1946   Cancelled Treatment:    Reason Eval/Treat Not Completed: Patient not medically ready (nursing asked to hold OT today due to patient being short of breath and is expected to leave unit for PICC Line.) Lodema Hong, [place]  Office [place] 05/17/2022, 11:45 AM

## 2022-05-17 NOTE — Progress Notes (Addendum)
PATIENT DETAILS Name: Douglas Perkins. Age: 77 y.o. Sex: male Date of Birth: 02/05/1946 Admit Date: 05/11/2022 Admitting Physician Kayleen Memos, DO PCP:Pcp, No  Brief Summary: Patient is a 77 y.o.  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   Significant events: 12/10-12/22>> hospitalization for group B strep bacteremia with pacemaker lead implantation.  Discharged on IV penicillin-end date 1/29. 01/11>> admit to [facility]-hypoxia due to PNA-initially on BiPAP.  Liberated off BiPAP post admission 01/12>> on around 8-10 L of HFNC. 01/13>> Down to 6 L of HFNC   Significant studies: 01/11>> CXR: Right> left infiltrates 01/12>> TTE: EF 55-60%, no obvious vegetation-but limited evaluation due to poor windows.   Significant microbiology data: 1/11>> COVID/influenza/RSV PCR: Negative 1/11>> blood culture: No growth 1/12>> respiratory virus panel: neg   Procedures: 12/15>> TEE: EF 55-60%-no valvular vegetation visualized 12/18>> permanent pacemaker lead explantation-implantation of leadless pacemaker.   Consults: ID  Subjective:  Overnight: NAEON  Denies any acute concerns. States doing well.   Objective: Slightly hypertensive. Vital signs in last 24 hours: Vitals:   05/16/22 2008 05/16/22 2240 05/17/22 0015 05/17/22 0308  BP: (!) 155/77  (!) 148/35 (!) 135/57  Pulse: 76  67 76  Resp: (!) 25  20 (!) 28  Temp: 98.2 F (36.8 C)  98.1 F (36.7 C) 98 F (36.7 C)  TempSrc: Oral  Oral Oral  SpO2: 91% 96% 99% 96%  Weight:      Height:       Supplemental O2: BiPAP Last BM Date : 05/11/22 SpO2: 96 % O2 Flow Rate (L/min): 6 L/min FiO2 (%): 50 % Filed Weights   05/14/22  0438 05/15/22 0500 05/16/22 0747  Weight: 122.5 kg 122.9 kg 122.4 kg    Intake/Output Summary (Last 24 hours) at 05/17/2022 0604 Last data filed at 05/16/2022 1826 Gross per 24 hour  Intake 248.81 ml  Output 1300 ml  Net -1051.19 ml   Net IO Since Admission: -1,011.19 mL [05/17/22 0604] Physical Exam General: NAD, on CPAP HENT: NCAT Lungs:  CTAB Cardiovascular: NSR Abdomen: No TTP MSK: lower extremities with improved edema Neuro: alert and oriented   Diagnostics    Latest Ref Rng & Units 05/17/2022    4:07 AM 05/16/2022    5:14 AM 05/15/2022    4:42 AM  CBC  WBC 4.0 - 10.5 K/uL 10.4  9.7  9.8   Hemoglobin 13.0 - 17.0 g/dL 9.4  9.1  9.7   Hematocrit 39.0 - 52.0 % 28.9  29.1  29.0   Platelets 150 - 400 K/uL 302  287  267        Latest Ref Rng & Units 05/17/2022    4:07 AM 05/16/2022    5:14 AM 05/15/2022    4:42 AM  BMP  Glucose 70 - 99 mg/dL 117  117  128   BUN 8 - 23 mg/dL '15  15  19   '$ Creatinine 0.61 - 1.24 mg/dL 0.79  0.71  0.85   Sodium 135 - 145 mmol/L 141  140  137   Potassium 3.5 - 5.1 mmol/L 3.8  4.1  3.6   Chloride 98 - 111 mmol/L 97  101  102   CO2 22 - 32 mmol/L 34  31  28  Calcium 8.9 - 10.3 mg/dL 8.8  8.6  8.3     Magnesium: 1.6 BNP: 1,120  CRP: 12.6 Procal 0.17 Assessment/Plan: Patient is a 77 y.o.  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 Clinical picture more consistent with CHF exacerbation. Received 60 mg IV lasix BID with 1.3 L output. Some discrepancy in documentation. Will continue IV diuresis with lasix 60 mg BID. Getting PCN 12 million units q12 hrs with stop date of 05/29/2022.  -Continue antibiotics/diuretics.   Hypokalemia/Hypomagnesemia Replete electrolytes with goal of 4 and 2 for K and Mag. Mag 1.6 today so will give 4 g of IV Mag.    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 Was previously on IV penicillin V through 1/29.  Currently on 12 million units PCN q12 hrs. Plan to finish IV abx through PICC line.  PICC line to be repositioned on 05/17/2022.   History of CAD Chronic. No angina-no wall motion abnormality on recent echo -Continue aspirin/statin/Coreg   History of symptomatic sinus node dysfunction Now with leadless pacemaker in place. -Continue telemetry monitoring   HTN Chronic. On Coreg and Losartan at home.  -Continue Coreg-resumed at a much lower dose -Continue to hold losartan to allow room for diuresis.     Normocytic anemia Improving. Due to acute illness -Transfuse if Hb<7   Hyponatremia Resolved.    Bipolar disorder Chronic. 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   Weight as of this encounter: 122.4 kg.     Diet: D3D IVF: None,None VTE: Enoxaparin Code: Full PT/OT recs: SNF for Subacute PT, other pending SNF. Prior to Admission Living Arrangement: Home Anticipated Discharge  Location: SNF Barriers to Discharge: Medical work up Dispo: Anticipated discharge in approximately 1 day(s).   Idamae Schuller, MD Tillie Rung. [facility] Internal Medicine Residency, PGY-2  Pager: [phone] After 5 pm and on weekends: Please call the on-call pager    Attestation  I have directly reviewed the clinical findings, lab results and imaging studies. I have interviewed and examined the patient and agree with the documentation and management as recorded by Dr. Humphrey Rolls.  Continue IV antibiotics to complete his course which finished date of 05/29/2022, his PICC line appears to be malpositioned PICC line team consulted for better positioning, continue IV Lasix high-dose on 05/17/2022 for diuresis.  Prepare for discharge in the next 1 to 2 days.  Lala Lund M.D on 05/17/2022 at [facility] Office  [phone]

## 2022-05-17 NOTE — TOC Progression Note (Signed)
Transition of Care (TOC) - Progression Note    Patient Details  Name: Douglas Perkins. MRN: [id number] Date of Birth: 28-Nov-1945  Transition of Care [facility]) CM/SW [place], LCSW Phone Number: 05/17/2022, 2:12 PM  Clinical Narrative:    CSW provided update to [facility]. They request to ensure patient is 4L of O2 or less.    Expected Discharge Plan: [place] Barriers to Discharge: Continued Medical Work up  Expected Discharge Plan and Services In-house Referral: Clinical Social Work     Living arrangements for the past 2 months: [place]                                       Social Determinants of Health (SDOH) Interventions SDOH Screenings   Food Insecurity: No Food Insecurity (05/12/2022)  Housing: Low Risk  (05/12/2022)  Transportation Needs: No Transportation Needs (05/12/2022)  Utilities: Not At Risk (05/12/2022)  Alcohol Screen: Low Risk  (03/03/2021)  Tobacco Use: Low Risk  (05/12/2022)    Readmission Risk Interventions    05/17/2022    2:11 PM  Readmission Risk Prevention Plan  Transportation Screening Complete  Medication Review (RN Care Manager) Complete  PCP or Specialist appointment within 3-5 days of discharge Complete  HRI or [place] Complete  SW Recovery Care/Counseling Consult Complete  Palliative Care Screening Not [place] Complete

## 2022-05-17 NOTE — Care Management Important Message (Signed)
Important Message  Patient Details  Name: Douglas Perkins. MRN: [id number] Date of Birth: [birth year]   Medicare Important Message Given:  Yes     Jamas Jaquay 05/17/2022, 2:12 PM

## 2022-05-17 NOTE — Progress Notes (Signed)
Peripherally Inserted Central Catheter Placement  The IV Nurse has discussed with the patient and/or persons authorized to consent for the patient, the purpose of this procedure and the potential benefits and risks involved with this procedure.  The benefits include less needle sticks, lab draws from the catheter, and the patient may be discharged home with the catheter. Risks include, but not limited to, infection, bleeding, blood clot (thrombus formation), and puncture of an artery; nerve damage and irregular heartbeat and possibility to perform a PICC exchange if needed/ordered by physician.  Alternatives to this procedure were also discussed.  Bard Power PICC patient education guide, fact sheet on infection prevention and patient information card has been provided to patient /or left at bedside.  Telephone consent obtained from wife due to altered mental status.  PICC inserted by Christella Noa, RN  PICC Placement Documentation  PICC Single Lumen 04/18/74 Right Basilic 48 cm 1 cm (Active)  Indication for Insertion or Continuance of Line Prolonged intravenous therapies 05/17/22 1100  Exposed Catheter (cm) 1 cm 05/15/22 0501  Site Assessment Clean, Dry, Intact 05/17/22 1100  Line Status Flushed;Infusing 05/17/22 1100  Dressing Type Transparent;Securing device 05/17/22 1100  Dressing Status Antimicrobial disc in place 05/17/22 1100  Safety Lock Not Applicable 88/32/54 9826  Line Care Connections checked and tightened 05/17/22 1100  Line Adjustment (NICU/IV Team Only) No 04/20/22 0730  Dressing Intervention Dressing changed;Antimicrobial disc changed 05/14/22 0954  Dressing Change Due 05/21/22 05/17/22 0412     PICC Single Lumen 41/58/30 Left Basilic 47 cm 0 cm (Active)  Indication for Insertion or Continuance of Line Prolonged intravenous therapies;Home intravenous therapies (PICC only) 05/17/22 1630  Exposed Catheter (cm) 0 cm 05/17/22 1630  Site Assessment Clean, Dry, Intact 05/17/22 1630   Line Status Flushed;Saline locked;Blood return noted 05/17/22 1630  Dressing Type Transparent;Securing device 05/17/22 1630  Dressing Status Antimicrobial disc in place;Clean, Dry, Intact 05/17/22 1630  Safety Lock Not Applicable 94/07/68 0881  Line Care Connections checked and tightened 05/17/22 1630  Line Adjustment (NICU/IV Team Only) No 05/17/22 1630  Dressing Intervention New dressing 05/17/22 1630  Dressing Change Due 05/24/22 05/17/22 [place], Nicolette Bang 05/17/2022, 4:31 PM

## 2022-05-18 DIAGNOSIS — J189 Pneumonia, unspecified organism: Secondary | ICD-10-CM | POA: Diagnosis not present

## 2022-05-18 LAB — CBC WITH DIFFERENTIAL/PLATELET
Abs Immature Granulocytes: 0.04 10*3/uL (ref 0.00–0.07)
Basophils Absolute: 0 10*3/uL (ref 0.0–0.1)
Basophils Relative: 0 %
Eosinophils Absolute: 0.4 10*3/uL (ref 0.0–0.5)
Eosinophils Relative: 5 %
HCT: 29 % — ABNORMAL LOW (ref 39.0–52.0)
Hemoglobin: 9.5 g/dL — ABNORMAL LOW (ref 13.0–17.0)
Immature Granulocytes: 0 %
Lymphocytes Relative: 18 %
Lymphs Abs: 1.7 10*3/uL (ref 0.7–4.0)
MCH: 31.4 pg (ref 26.0–34.0)
MCHC: 32.8 g/dL (ref 30.0–36.0)
MCV: 95.7 fL (ref 80.0–100.0)
Monocytes Absolute: 1.6 10*3/uL — ABNORMAL HIGH (ref 0.1–1.0)
Monocytes Relative: 16 %
Neutro Abs: 5.9 10*3/uL (ref 1.7–7.7)
Neutrophils Relative %: 61 %
Platelets: 303 10*3/uL (ref 150–400)
RBC: 3.03 MIL/uL — ABNORMAL LOW (ref 4.22–5.81)
RDW: 13.6 % (ref 11.5–15.5)
WBC: 9.7 10*3/uL (ref 4.0–10.5)
nRBC: 0 % (ref 0.0–0.2)

## 2022-05-18 LAB — PROCALCITONIN: Procalcitonin: 0.14 ng/mL

## 2022-05-18 LAB — BASIC METABOLIC PANEL
Anion gap: 6 (ref 5–15)
BUN: 16 mg/dL (ref 8–23)
CO2: 39 mmol/L — ABNORMAL HIGH (ref 22–32)
Calcium: 8.5 mg/dL — ABNORMAL LOW (ref 8.9–10.3)
Chloride: 95 mmol/L — ABNORMAL LOW (ref 98–111)
Creatinine, Ser: 0.9 mg/dL (ref 0.61–1.24)
GFR, Estimated: >60 mL/min (ref >60)
Glucose, Bld: 106 mg/dL — ABNORMAL HIGH (ref 70–99)
Potassium: 3.5 mmol/L (ref 3.5–5.1)
Sodium: 140 mmol/L (ref 135–145)

## 2022-05-18 MED ORDER — CARVEDILOL PHOSPHATE ER 10 MG PO CP24: 10.0000 mg | ORAL_CAPSULE | Freq: Every day | ORAL | Status: DC

## 2022-05-18 MED ORDER — POTASSIUM CHLORIDE ER 10 MEQ PO TBCR: 10.0000 meq | EXTENDED_RELEASE_TABLET | Freq: Every day | ORAL | 0 refills | Status: AC

## 2022-05-18 MED ORDER — FUROSEMIDE 10 MG/ML IJ SOLN
60.0000 mg | Freq: Two times a day (BID) | INTRAMUSCULAR | Status: DC
  Administered 2022-05-18: 60 mg via INTRAVENOUS
  Filled 2022-05-18: qty 6

## 2022-05-18 MED ORDER — POTASSIUM CHLORIDE CRYS ER 20 MEQ PO TBCR
40.0000 meq | EXTENDED_RELEASE_TABLET | Freq: Once | ORAL | Status: AC
  Administered 2022-05-18: 40 meq via ORAL
  Filled 2022-05-18: qty 2

## 2022-05-18 MED ORDER — SPIRONOLACTONE 25 MG PO TABS
25.0000 mg | ORAL_TABLET | Freq: Once | ORAL | Status: AC
  Administered 2022-05-18: 25 mg via ORAL
  Filled 2022-05-18: qty 1

## 2022-05-18 MED ORDER — CARVEDILOL PHOSPHATE ER 10 MG PO CP24
10.0000 mg | ORAL_CAPSULE | Freq: Every day | ORAL | Status: DC
  Filled 2022-05-18: qty 1

## 2022-05-18 MED ORDER — FUROSEMIDE 20 MG PO TABS: 20.0000 mg | ORAL_TABLET | Freq: Every day | ORAL | 0 refills | Status: DC

## 2022-05-18 NOTE — Progress Notes (Signed)
OT Cancellation Note  Patient Details Name: Douglas Perkins. MRN: [id number] DOB: 29-Sep-1945   Cancelled Treatment:    Reason Eval/Treat Not Completed: Medical issues which prohibited therapy.  Patient on BiPap resting.    Rohit Deloria D Tawny Raspberry 05/18/2022, 1:42 PM 05/18/2022  RP, OTR/L  Acute Rehabilitation Services  Office:  [phone]

## 2022-05-18 NOTE — TOC Transition Note (Signed)
Transition of Care [facility]) - CM/SW Discharge Note   Patient Details  Name: Douglas Perkins. MRN: [id number] Date of Birth: 07/06/1945  [facility] CM/SW Contact:  Benard Halsted, LCSW Phone Number: 05/18/2022, 2:32 PM   Clinical Narrative:    Patient will DC to: [place] date: 05/08/22 Family notified: Spouse Transport by: Corey Harold   Per MD patient ready for DC to [facility] [place]. RN to call report prior to discharge [phone] room 1205p). RN, patient, patient's family, and facility notified of DC. Patient aware that he is returning to [place] and not [facility] yet. Discharge Summary and FL2 sent to facility. DC packet on chart. Ambulance transport requested for patient.   CSW will sign off for now as social work intervention is no longer needed. Please consult [place] again if new needs arise.       Barriers to Discharge: Barriers Resolved   Patient Goals and CMS Choice CMS Medicare.gov Compare Post Acute Care list provided to:: Patient Represenative (must comment) Choice offered to / list presented to : Spouse  Discharge Placement     Existing PASRR number confirmed : 05/18/22          Patient [facility] bed at: [facility] Patient to be transferred to facility by: [place] Name of family member notified: Spouse Patient and family notified of of transfer: 05/18/22  Discharge Plan and Services Additional resources added to the After Visit Summary for   In-house Referral: Clinical Social Work                                   Social Determinants of Health ([place]) Interventions SDOH Screenings   Food Insecurity: No Food Insecurity (05/12/2022)  Housing: Low Risk  (05/12/2022)  Transportation Needs: No Transportation Needs (05/12/2022)  Utilities: Not At Risk (05/12/2022)  Alcohol Screen: Low Risk  (03/03/2021)  Tobacco Use: Low Risk  (05/12/2022)     Readmission Risk Interventions    05/17/2022    2:11 PM  Readmission Risk Prevention  Plan  Transportation Screening Complete  Medication Review ([place]) Complete  PCP or Specialist appointment within 3-5 days of discharge Complete  HRI or [place] Complete  SW Recovery Care/Counseling Consult Complete  Palliative Care Screening Not [place] Complete

## 2022-05-18 NOTE — Discharge Summary (Signed)
[name] DOB: Oct 03, 1945 DOA: 05/11/2022  PCP: Pcp, No  Admit date: 05/11/2022  Discharge date: 05/18/2022  Admitted From: SNF   Disposition:  SNF   Recommendations for Outpatient Follow-up:   Follow up with PCP in 1-2 weeks  PCP Please obtain BMP/CBC, 2 view CXR in 1week,  (see Discharge instructions)   PCP Please follow up on the following pending results: Needs close outpatient follow-up with ID, remove left arm PICC line once he has finished his IV antibiotic course.  Check CBC, BMP and magnesium in 7 to 10 days adjust blood pressure and diuretic dose as needed.   Home Health: None   Equipment/Devices: None  Consultations: NID Discharge Condition: Stable    CODE STATUS: Full    Diet Recommendation: Dysphagia 3 diet, nectar thick fluids.  1500 cc fluid restriction.  Feeding assistance and aspiration precautions.    Chief Complaint  Patient presents with   Shortness of Breath     Brief history of present illness from the day of admission and additional interim summary    77 y.o.  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   Significant events: 12/10-12/22>> hospitalization for group B strep bacteremia with pacemaker lead implantation.  Discharged on IV penicillin-end date 1/29. 01/11>> admit to [facility]-hypoxia due to PNA-initially on BiPAP.  Liberated off BiPAP post admission 01/12>> on around 8-10 L of HFNC. 01/13>> Down to 6 L of HFNC   Significant studies: 01/11>> CXR: Right>  left infiltrates 01/12>> TTE: EF 55-60%, no obvious vegetation-but limited evaluation due to poor windows.   Significant microbiology data: 1/11>> COVID/influenza/RSV PCR: Negative 1/11>> blood culture: No growth 1/12>> respiratory virus panel: neg   Procedures: 12/15>> TEE: EF 55-60%-no valvular vegetation visualized 12/18>> permanent pacemaker lead explantation-implantation of leadless pacemaker.                                                                 Hospital Course   Acute hypoxic respiratory failure due to combination of multifocal PNA and HFpEF exacerbation Hypoxia improving-initially on BiPAP but now on [place]. Clinical picture more consistent with CHF exacerbation. Received IV Lasix along with oral Aldactone and potassium with good results, he has diuresed very well in the last few days now symptom-free on 3 L nasal cannula oxygen and daytime which should be continued.  Uses inspire device at night.  Also placed on diuretics upon discharge along with fluid restriction.  Monitor closely at SNF.   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 Was previously on IV penicillin V through 1/29.  Currently on 12 million units PCN q12 hrs. Plan to finish IV abx through PICC line.  His old PICC line was removed due to malpositioning new PICC line left arm placed on 05/17/2022.  Currently discontinue PICC line once he finishes his IV antibiotic course.  Follow-up with ID within 7 to 10 days of discharge.   History of CAD Chronic. No angina-no wall motion abnormality on recent echo -Continue aspirin/statin/Coreg   History of symptomatic sinus node dysfunction Now with leadless pacemaker in place. Outpatient follow-up with his primary cardiologist within 7 to 10 days of discharge.   HTN Blood pressure on the lower side after diuresis Coreg and ACE inhibitor dose adjusted  upon discharge PCP to monitor and adjust.   Normocytic anemia Improving. Due to acute illness -Transfuse if Hb<7   Hyponatremia Resolved.    Bipolar disorder Chronic. 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   Obesity: BMI 36 follow-up with PCP for weight loss  Discharge diagnosis     Principal Problem:   Multifocal pneumonia Active Problems:   Acute respiratory failure with hypoxia (HCC)   Heart failure with preserved ejection fraction (HCC)   Leukocytosis   Bipolar I disorder (HCC)   Hyponatremia   Streptococcal bacteremia   Normocytic anemia    Discharge instructions    Discharge Instructions     [facility] pharmacist to adjust dose for Vancomycin, Aminoglycosides and other anti-infective therapies as requested by physician.   Complete by: As directed    [facility] to provide Cath Flo '2mg'$    Complete by: As directed    Administer for PICC line occlusion and as ordered by physician for other access device issues.   Anaphylaxis Kit: Provided to treat any anaphylactic reaction to the medication being provided to the patient if First Dose or when requested by physician   Complete by: As directed    Epinephrine '1mg'$ /ml vial / amp: Administer 0.'3mg'$  (0.72m) subcutaneously once for moderate to severe anaphylaxis, nurse to call physician and pharmacy when reaction occurs and call 911 if needed for immediate care   Diphenhydramine '50mg'$ /ml IV vial: Administer 25-'50mg'$  IV/IM PRN for first dose reaction, rash, itching, mild reaction, nurse to call physician and pharmacy when reaction occurs   Sodium Chloride 0.9% NS 5014mIV: Administer if needed for hypovolemic blood pressure drop or as ordered by physician after call to physician with anaphylactic reaction   Change dressing on IV access  line weekly and PRN   Complete by: As directed    Discharge instructions   Complete by: As directed    Follow with Primary MD in 7 days   Get CBC, CMP, 2 view Chest X ray -  checked next visit with your primary MD or SNF MD    Activity: As tolerated with Full fall precautions use walker/cane & assistance as needed  Disposition SNF  Diet: Dysphagia 3 diet with nectar thick liquids, feeding assistance and aspiration precautions, 1.5 L fluid restriction per day.  Check your Weight same time everyday, if you gain over 2 pounds, or you develop in leg swelling, experience more shortness of breath or chest pain, call your Primary MD immediately. Follow Cardiac Low Salt Diet and 1.5 lit/day fluid restriction.  Special Instructions: If you have smoked  or chewed Tobacco  in the last 2 yrs please stop smoking, stop any regular Alcohol  and or any Recreational drug use.  On your next visit with your primary care physician please Get Medicines reviewed and adjusted.  Please request your Prim.MD to go over all Hospital Tests and Procedure/Radiological results at the follow up, please get all Hospital records sent to your Prim MD by signing hospital release before you go home.  If you experience worsening of your admission symptoms, develop shortness of breath, life threatening emergency, suicidal or homicidal thoughts you must seek medical attention immediately by calling 911 or calling your MD immediately  if symptoms less severe.  You Must read complete instructions/literature along with all the possible adverse reactions/side effects for all the Medicines you take and that have been prescribed to you. Take any new Medicines after you have completely understood and accpet all the possible adverse reactions/side effects.   Flush IV access with Sodium Chloride 0.9% and Heparin 10 units/ml or 100 units/ml   Complete by: As directed    Home infusion instructions - [facility]   Complete by: As  directed    Instructions: Flush IV access with Sodium Chloride 0.9% and Heparin 10units/ml or 100units/ml   Change dressing on IV access line: Weekly and PRN   Instructions Cath Flo '2mg'$ : Administer for PICC Line occlusion and as ordered by physician for other access device   [facility] pharmacist to adjust dose for: Vancomycin, Aminoglycosides and other anti-infective therapies as requested by physician   Increase activity slowly   Complete by: As directed    Method of administration may be changed at the discretion of home infusion pharmacist based upon assessment of the patient and/or caregiver's ability to self-administer the medication ordered   Complete by: As directed    No wound care   Complete by: As directed        Discharge Medications   Allergies as of 05/18/2022       Reactions   Sulfa Antibiotics Hives   Feldene [piroxicam] Hives        Medication List     STOP taking these medications    losartan 25 MG tablet Commonly known as: COZAAR       TAKE these medications    acetaminophen 500 MG tablet Commonly known as: TYLENOL Take 1,000 mg by mouth in the morning and at bedtime. May also 2 tablets by mouth daily as needed for knee pain   albuterol 108 (90 Base) MCG/ACT inhaler Commonly known as: VENTOLIN HFA Inhale 1 puff into the lungs every 8 (eight) hours as needed for wheezing or shortness of breath.   aspirin EC 81 MG tablet Take 1 tablet (81 mg total) by mouth daily. Reported on 08/18/2015   atorvastatin 40 MG tablet Commonly known as: LIPITOR Take 1 tablet (40 mg total) by mouth daily. What changed: when to take this   bisacodyl 5 MG EC tablet Commonly known as: DULCOLAX Take 1 tablet (5 mg total) by mouth daily as needed for moderate constipation.   carvedilol 10 MG 24 hr capsule Commonly known as: COREG CR Take 1 capsule (10 mg total) by mouth daily. What changed:  medication strength how much to take   dextromethorphan 7.5  MG/5ML Syrp Take 15 mg by mouth every 6 (six) hours as needed (for cough).   divalproex 500 MG DR tablet Commonly known as: DEPAKOTE Take 2 tablets (1,000 mg total) by mouth at bedtime.  dorzolamide 2 % ophthalmic solution Commonly known as: TRUSOPT Place 1 drop into the right eye 2 (two) times daily.   fluticasone-salmeterol 45-21 MCG/ACT inhaler Commonly known as: ADVAIR HFA Inhale 2 puffs into the lungs 2 (two) times daily.   furosemide 20 MG tablet Commonly known as: Lasix Take 1 tablet (20 mg total) by mouth daily.   gabapentin 300 MG capsule Commonly known as: NEURONTIN Take 1 capsule (300 mg total) by mouth 2 (two) times daily.   ipratropium-albuterol 0.5-2.5 (3) MG/3ML Soln Commonly known as: DUONEB Take 3 mLs by nebulization once.   LACTOBACILLUS PROBIOTIC PO Take 1 capsule by mouth daily.   latanoprost 0.005 % ophthalmic solution Commonly known as: XALATAN Place 1 drop into the right eye 2 (two) times daily.   Melatonin 10 MG Tabs Take 10 mg by mouth at bedtime.   MULTIVITAL PO Take 1 tablet by mouth daily.   nystatin powder Generic drug: nystatin Apply 1 Application topically 2 (two) times daily. Apply topically twice a day to groin region, bilateral buttocks, sacrum and inner upper thighs   pantoprazole 40 MG tablet Commonly known as: PROTONIX Take 1 tablet (40 mg total) by mouth 2 (two) times daily.   penicillin G  IVPB Inject 24 Million Units into the vein daily. Indication:  GBS ICD infection First Dose: Yes Last Day of Therapy:  05/29/22 Labs - Once weekly:  CBC/D and BMP, Labs - Every other week:  ESR and CRP Method of administration: Elastomeric (Continuous infusion) Pull PICC line at the completion of IV antibiotics Method of administration may be changed at the discretion of home infusion pharmacist based upon assessment of the patient and/or caregiver's ability to self-administer the medication ordered.   potassium chloride 10 MEQ  tablet Commonly known as: KLOR-CON Take 1 tablet (10 mEq total) by mouth daily.   QUEtiapine 300 MG tablet Commonly known as: SEROQUEL Take 1 tablet (300 mg total) by mouth at bedtime.   tamsulosin 0.4 MG Caps capsule Commonly known as: FLOMAX Take 0.4 mg by mouth at bedtime.   timolol 0.5 % ophthalmic solution Commonly known as: TIMOPTIC Place 1 drop into the right eye 2 (two) times daily.               Discharge Care Instructions  (From admission, onward)           Start     Ordered   05/18/22 0000  Change dressing on IV access line weekly and PRN  (Home infusion instructions - [facility] )        05/18/22 0918             Follow-up Information     Vu, Rockey Situ, MD. Schedule an appointment as soon as possible for a visit in 1 week(s).   Specialty: Infectious Diseases Why: bacteremia Contact information: [address] [place] [address] [phone]                 Major procedures and Radiology Reports - PLEASE review detailed and final reports thoroughly  -       DG CHEST PORT 1 VIEW  Result Date: 05/17/2022 CLINICAL DATA:  Status post PICC placement. EXAM: PORTABLE CHEST 1 VIEW COMPARISON:  Chest radiograph 05/17/2022 at 6:56 a.m. FINDINGS: A left upper extremity PICC has been placed and terminates over the mid SVC. The right PICC has been removed. A neural stimulator is again noted with lead coursing into the right neck. The cardiac  silhouette remains enlarged. Confluent airspace opacity in the right upper lobe and patchy airspace opacities elsewhere bilaterally are unchanged. No sizable pleural effusion or pneumothorax is identified. Pulmonary vascular congestion is unchanged. IMPRESSION: 1. Interval left upper extremity PICC placement which terminates in the mid SVC. 2. Unchanged bilateral airspace disease. Electronically Signed   By: Logan Bores M.D.   On: 05/17/2022 17:01   [place] EKG SITE RITE  Result Date:  05/17/2022 If Site Rite image not attached, placement could not be confirmed due to current cardiac rhythm.  DG Chest Port 1 View  Result Date: 05/17/2022 CLINICAL DATA:  Shortness of breath. EXAM: PORTABLE CHEST 1 VIEW COMPARISON:  05/14/2022 FINDINGS: Patchy bilateral airspace disease again noted, notably in the right upper lobe, left mid lung, and right base with imaging features similar to prior study. The cardio pericardial silhouette is enlarged. There is pulmonary vascular congestion without overt pulmonary edema. Battery pack for stimulator device overlies the right chest. Right PICC line is looped on itself in the right internal jugular vein with the tip positioned in the right subclavicular region, likely in the subclavian vein. IMPRESSION: 1. Right PICC line is looped on itself in the right internal jugular vein with the tip positioned in the right subclavicular region, likely in the subclavian vein with the tip directed peripherally. This represents a change from the study of 05/13/2022 when the catheter was still looped in the internal jugular vein but the tip was positioned in the proximal SVC. As such, right PICC line tip has migrated in the interval since that exam into the subclavian vein. 2. Similar appearance of patchy bilateral airspace disease. These results will be called to the ordering clinician or representative by the Radiologist Assistant, and communication documented in the PACS or [employer]. Electronically Signed   By: Misty Stanley M.D.   On: 05/17/2022 07:32   DG Swallowing Func-Speech Pathology  Result Date: 05/16/2022 Table formatting from the original result was not included. Objective Swallowing Evaluation: Type of Study: MBS-Modified Barium Swallow Study  Patient Details Name: Douglas Perkins. MRN: [id number] Date of Birth: 15-Dec-1945 Today's Date: 05/16/2022 Time: SLP Start Time (ACUTE ONLY): 1035 -SLP Stop Time (ACUTE ONLY): 1057 SLP Time Calculation (min) (ACUTE  ONLY): 22 min Past Medical History: Past Medical History: Diagnosis Date  Bipolar 1 disorder ([place])   Cancer ([place])   prostate  Glaucoma   Hypertension   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[place])   Mural thrombus of cardiac apex   Presence of permanent cardiac pacemaker 2017  SA node dysfunction  Sleep apnea   does not use CPAP  TIA (transient ischemic attack)  Past Surgical History: Past Surgical History: Procedure Laterality Date  BUBBLE STUDY  04/14/2022  Procedure: BUBBLE STUDY;  Surgeon: Lelon Perla, MD;  Location: [place];  Service: Cardiovascular;;  CHOLECYSTECTOMY    COLONOSCOPY WITH PROPOFOL N/A 01/31/2017  Procedure: COLONOSCOPY WITH PROPOFOL;  Surgeon: Manya Silvas, MD;  Location: [facility] ENDOSCOPY;  Service: Endoscopy;  Laterality: N/A;  DRUG INDUCED ENDOSCOPY N/A 05/12/2020  Procedure: DRUG INDUCED ENDOSCOPY;  Surgeon: Melida Quitter, MD;  Location: [place];  Service: ENT;  Laterality: N/A;  IMPLANTATION OF HYPOGLOSSAL NERVE STIMULATOR N/A 07/21/2020  Procedure: IMPLANTATION OF HYPOGLOSSAL NERVE STIMULATOR;  Surgeon: Melida Quitter, MD;  Location: [place];  Service: ENT;  Laterality: N/A;  LEAD EXTRACTION N/A 04/17/2022  Procedure: LEAD EXTRACTION;  Surgeon: Vickie Epley, MD;  Location: [place] CV LAB;  Service: Cardiovascular;  Laterality: N/A;  LEG SURGERY Left   distal  Pace maker placement    PROSTATE SURGERY    PROSTATECTOMY    SKIN CANCER EXCISION  [phone]  Merkle Cell Carcinoma  TEE WITHOUT CARDIOVERSION N/A 04/14/2022  Procedure: TRANSESOPHAGEAL ECHOCARDIOGRAM (TEE);  Surgeon: Lelon Perla, MD;  Location: [facility] ENDOSCOPY;  Service: Cardiovascular;  Laterality: N/A; HPI: Pt is a 77 y/o male who presented with worsening cough, poor secretion management, and shortness of breath. Admitted with multifocal PNA. Following a recent pacemaker infection, pt was [facility] to [facility] 12/22. Prior swallow eval (06/27/15) completed at another hospital was [facility] with no  remarkable findings. He has a hx of prostate and merkel cell cancer, sleep apnea, and TIA. Pt uses 3L [place] at baseline.  Subjective: very HOH  Recommendations for follow up therapy are one component of a multi-disciplinary discharge planning process, led by the attending physician.  Recommendations may be updated based on patient status, additional functional criteria and insurance authorization. Assessment / Plan / Recommendation   05/16/2022   1:00 PM Clinical Impressions Clinical Impression Pt presents with a mild oropharyngeal dysphagia that appears to be exacerbated by current respiratory status and mentation. Orally he sometimes plays with boluses in his mouth, rocking them with his tongue before swallowing them. He leaves mild lingual residue behind but he often swallows spontaneously to clear it. His overall pharyngeal function is relatively intact but as he starts to become more short of breath and more impulsive, his swallow becomes less organized and timely. This leads to moments of aspiration with thin liquids (PAS 7), which are not eliminated by use of a chin tuck (PAS 8). When physically assisted to take single sips he has improving airway protection (PAS 2), but he can protect his airway well with nectar thick liquids (PAS 2) regardless of any pacing or external assist. Considering the above, recommend Dys 3 diet and nectar thick liquids with hopeful ability to resume thin liquids as respiratory status improves. SLP Visit Diagnosis Dysphagia, oropharyngeal phase (R13.12) Impact on safety and function Mild aspiration risk     05/16/2022   1:00 PM Treatment Recommendations Treatment Recommendations Therapy as outlined in treatment plan below     05/16/2022   1:00 PM Prognosis Prognosis for Safe Diet Advancement Good Barriers to Reach Goals Cognitive deficits;Other (Comment)   05/16/2022   1:00 PM Diet Recommendations SLP Diet Recommendations Dysphagia 3 (Mech soft) solids;Nectar thick liquid Liquid  Administration via Cup;Straw Medication Administration Whole meds with puree Compensations Slow rate;Small sips/bites;Other (Comment) Postural Changes Seated upright at 90 degrees     05/16/2022   1:00 PM Other Recommendations Oral Care Recommendations Oral care BID Other Recommendations Prohibited food (jello, ice cream, thin soups);Remove water pitcher Follow Up Recommendations Skilled nursing-short term rehab (<3 hours/day) Functional Status Assessment Patient has had a recent decline in their functional status and demonstrates the ability to make significant improvements in function in a reasonable and predictable amount of time.   05/16/2022   1:00 PM Frequency and Duration  Speech Therapy Frequency (ACUTE ONLY) min 2x/week Treatment Duration 2 weeks     05/16/2022   1:00 PM Oral Phase Oral Phase Impaired Oral - Nectar Teaspoon WFL Oral - Nectar Cup Reduced posterior propulsion;Lingual/palatal residue;Decreased bolus cohesion Oral - Nectar Straw Delayed oral transit Oral - Thin Teaspoon WFL Oral - Thin Cup Lingual/palatal residue Oral - Thin Straw Lingual/palatal residue;Premature spillage Oral - Puree Lingual/palatal residue Oral - Regular Impaired mastication    05/16/2022  1:00 PM Pharyngeal Phase Pharyngeal Phase Impaired Pharyngeal- Nectar Teaspoon WFL Pharyngeal- Nectar Cup WFL Pharyngeal- Nectar Straw Penetration/Aspiration during swallow Pharyngeal Material enters airway, remains ABOVE vocal cords then ejected out Pharyngeal- Thin Teaspoon WFL Pharyngeal- Thin Cup Penetration/Aspiration during swallow Pharyngeal Material enters airway, remains ABOVE vocal cords then ejected out Pharyngeal- Thin Straw Penetration/Aspiration before swallow Pharyngeal Material enters airway, passes BELOW cords without attempt by patient to eject out (silent aspiration) Pharyngeal- Puree WFL Pharyngeal- Regular Reduced tongue base retraction;Pharyngeal residue - valleculae    05/16/2022   1:00 PM Cervical Esophageal Phase   Cervical Esophageal Phase [facility] [name]., M.A. [facility] Office [phone] Secure chat preferred 05/16/2022, 2:35 PM                     DG Chest Port 1 View  Result Date: 05/14/2022 CLINICAL DATA:  Short of breath EXAM: PORTABLE CHEST 1 VIEW COMPARISON:  Prior chest x-ray yesterday FINDINGS: Stable cardiomegaly. Atherosclerotic calcifications present in the transverse aorta. Generator pack overlies the right chest with electrical lead extending cephalad. Persistent pulmonary vascular congestion. Overall, slight interval improvement in aeration with decreased interstitial airspace opacities. Persistent patchy foci of airspace opacity in the right upper lung, right lung base and left mid lung. IMPRESSION: 1. Overall, slight interval improvement in aeration of the lungs bilaterally likely reflecting a decrease in the pulmonary edema component. 2. Persistent multifocal patchy airspace opacities concerning for multifocal pneumonia. 3. Stable cardiomegaly. Electronically Signed   By: Jacqulynn Cadet M.D.   On: 05/14/2022 08:29   DG Chest Port 1 View  Result Date: 05/13/2022 CLINICAL DATA:  Shortness of breath. EXAM: PORTABLE CHEST 1 VIEW COMPARISON:  05/11/2022 FINDINGS: Stable mild cardiomegaly. Right arm PICC line remains looped in the internal jugular vein, with the tip overlying the proximal SVC. Neurostimulator device again seen in the right hemithorax. Patchy bilateral pulmonary airspace disease again seen bilaterally, with mild worsening noted in the right upper lobe and left midlung. No evidence of pneumothorax or pleural effusion. IMPRESSION: Patchy bilateral pulmonary airspace disease, with mild worsening in right upper lobe and left midlung. Right arm PICC line remains looped in the internal jugular vein, with tip overlying the proximal SVC. Electronically Signed   By: Marlaine Hind M.D.   On: 05/13/2022 10:04   ECHOCARDIOGRAM LIMITED  Result Date: 05/12/2022     ECHOCARDIOGRAM LIMITED REPORT   Patient Name:   Ausar Georgiou. Date of Exam: 05/12/2022 Medical Rec #:  [id number]           Height:       72.0 in Accession #:    [id number]          Weight:       256.0 lb Date of Birth:  [birth year]           BSA:          2.366 m Patient Age:    1 years            BP:           129/76 mmHg Patient Gender: M                   HR:           65 bpm. Exam Location:  Inpatient Procedure: Limited Echo, Cardiac Doppler and Limited Color Doppler Indications:    Endocarditis I38  History:        Patient has prior history of Echocardiogram examinations,  most                 recent 04/17/2022. Thrombus, Pacemaker, Stroke and cancer,                 Signs/Symptoms:Shortness of Breath; Risk Factors:Non-Smoker and                 Hypertension.  Sonographer:    Greer Pickerel Referring Phys: [id number] CORNELIUS N VAN DAM  Sonographer Comments: Patient is obese. Image acquisition challenging due to respiratory motion. IMPRESSIONS  1. Poor quality study. Overall, LVEF appears normal but cannot assess for wall motion. Limited evaluation of valvular structures due to poor windows. Suggest TEE if there clinical concerns for endocarditis.  2. Left ventricular ejection fraction, by estimation, is 55 to 60%. The left ventricle has normal function. Left ventricular endocardial border not optimally defined to evaluate regional wall motion. Indeterminate diastolic filling due to E-A fusion.  3. Right ventricular systolic function was not well visualized. The right ventricular size is not well visualized.  4. The mitral valve was not well visualized.  5. The aortic valve was not well visualized.  6. The inferior vena cava is dilated in size with <50% respiratory variability, suggesting right atrial pressure of 15 mmHg. FINDINGS  Left Ventricle: Left ventricular ejection fraction, by estimation, is 55 to 60%. The left ventricle has normal function. Left ventricular endocardial border not optimally defined to  evaluate regional wall motion. Indeterminate diastolic filling due to E-A fusion. Right Ventricle: The right ventricular size is not well visualized. Right vetricular wall thickness was not well visualized. Right ventricular systolic function was not well visualized. Pericardium: Trivial pericardial effusion is present. Mitral Valve: The mitral valve was not well visualized. Tricuspid Valve: The tricuspid valve is not well visualized. Aortic Valve: The aortic valve was not well visualized. Pulmonic Valve: The pulmonic valve was not well visualized. Venous: The inferior vena cava is dilated in size with less than 50% respiratory variability, suggesting right atrial pressure of 15 mmHg. Additional Comments: Spectral Doppler performed. Color Doppler performed.  Eleonore Chiquito MD Electronically signed by Eleonore Chiquito MD Signature Date/Time: 05/12/2022/3:34:18 PM    Final    DG Chest Portable 1 View  Result Date: 05/11/2022 CLINICAL DATA:  Coughing and shortness of breath. EXAM: PORTABLE CHEST 1 VIEW COMPARISON:  CTA chest 04/09/2022, AP and lateral chest 04/18/2022 FINDINGS: 3:05 a.m. There is either a loop recorder device or leadless pacemaker superimposing over the lower left chest. A right chest implanted battery is again noted with a single wire extending up into the right neck. There is a new right PICC which loops up into the IJ vein at the level of the brachiocephalic/SVC junction then continues inferiorly into the upper most SVC. The heart is enlarged. There is mild increased central vascular prominence. Interstitial and patchy airspace opacities are present throughout the lungs, right-greater-than-left. Small pleural effusions are beginning to develop. Findings could be due to edema with asymmetry, pneumonia or combination. The mediastinum is normally outlined. There is aortic atherosclerosis. Thoracic spondylosis and osteopenia. IMPRESSION: 1. New right PICC loops up into the IJ vein at the level of the  brachiocephalic/SVC junction then continues inferiorly terminating in the upper most SVC. 2. Cardiomegaly with mild increased central vascular prominence. 3. Interstitial and patchy airspace opacities throughout the lungs, right-greater-than-left. Findings could be due to edema with asymmetry, pneumonia or combination. 4. Small pleural effusions. 5. Aortic atherosclerosis. 6. Clinical correlation and radiographic follow-up recommended. Electronically Signed  By: Telford Nab M.D.   On: 05/11/2022 03:25   CUP PACEART INCLINIC DEVICE CHECK  Result Date: 04/26/2022 Wound check appointment. Sutures previously removed at Pt's facility. Wound without redness or edema. Incision edges approximated, wound well healed. Normal MICRA device function. See media for complete interrogation.Myrtie Hawk, BSN, RN  [place] EKG SITE RITE  Result Date: 04/18/2022 If [facility] image not attached, placement could not be confirmed due to current cardiac rhythm.  [place] EKG SITE RITE  Result Date: 04/18/2022 If Site Rite image not attached, placement could not be confirmed due to current cardiac rhythm.   Micro Results     Recent Results (from the past 240 hour(s))  Resp panel by RT-PCR (RSV, Flu A&B, Covid) Anterior Nasal Swab     Status: None   Collection Time: 05/11/22  3:01 AM   Specimen: Anterior Nasal Swab  Result Value Ref Range Status   SARS Coronavirus 2 by RT PCR NEGATIVE NEGATIVE Final    Comment: (NOTE) SARS-CoV-2 target nucleic acids are NOT DETECTED.  The SARS-CoV-2 RNA is generally detectable in upper respiratory specimens during the acute phase of infection. The lowest concentration of SARS-CoV-2 viral copies this assay can detect is 138 copies/mL. A negative result does not preclude SARS-Cov-2 infection and should not be used as the sole basis for treatment or other patient management decisions. A negative result may occur with  improper specimen collection/handling, submission of specimen  other than nasopharyngeal swab, presence of viral mutation(s) within the areas targeted by this assay, and inadequate number of viral copies(<138 copies/mL). A negative result must be combined with clinical observations, patient history, and epidemiological information. The expected result is Negative.  Fact Sheet for Patients:  EntrepreneurPulse.com.au  Fact Sheet for Healthcare Providers:  IncredibleEmployment.be  This test is no t yet approved or cleared by the [place] FDA and  has been authorized for detection and/or diagnosis of SARS-CoV-2 by FDA under an Emergency Use Authorization (EUA). This EUA will remain  in effect (meaning this test can be used) for the duration of the COVID-19 declaration under Section 564(b)(1) of the Act, 21 U.S.C.section 360bbb-3(b)(1), unless the authorization is terminated  or revoked sooner.       Influenza A by PCR NEGATIVE NEGATIVE Final   Influenza B by PCR NEGATIVE NEGATIVE Final    Comment: (NOTE) The Xpert Xpress SARS-CoV-2/FLU/RSV plus assay is intended as an aid in the diagnosis of influenza from Nasopharyngeal swab specimens and should not be used as a sole basis for treatment. Nasal washings and aspirates are unacceptable for Xpert Xpress SARS-CoV-2/FLU/RSV testing.  Fact Sheet for Patients: EntrepreneurPulse.com.au  Fact Sheet for Healthcare Providers: IncredibleEmployment.be  This test is not yet approved or cleared by the [place] FDA and has been authorized for detection and/or diagnosis of SARS-CoV-2 by FDA under an Emergency Use Authorization (EUA). This EUA will remain in effect (meaning this test can be used) for the duration of the COVID-19 declaration under Section 564(b)(1) of the Act, 21 U.S.C. section 360bbb-3(b)(1), unless the authorization is terminated or revoked.     Resp Syncytial Virus by PCR NEGATIVE NEGATIVE Final     Comment: (NOTE) Fact Sheet for Patients: EntrepreneurPulse.com.au  Fact Sheet for Healthcare Providers: IncredibleEmployment.be  This test is not yet approved or cleared by the [place] FDA and has been authorized for detection and/or diagnosis of SARS-CoV-2 by FDA under an Emergency Use Authorization (EUA). This EUA will remain in effect (meaning this test can be used)  for the duration of the COVID-19 declaration under Section 564(b)(1) of the Act, 21 U.S.C. section 360bbb-3(b)(1), unless the authorization is terminated or revoked.  Performed at [facility], [place] [address] [place], [place] [address]   Respiratory (~20 pathogens) panel by PCR     Status: None   Collection Time: 05/11/22  3:01 AM   Specimen: Nasopharyngeal Swab; Respiratory  Result Value Ref Range Status   Adenovirus NOT DETECTED NOT DETECTED Final   Coronavirus 229E NOT DETECTED NOT DETECTED Final    Comment: (NOTE) The Coronavirus on the Respiratory Panel, DOES NOT test for the novel  Coronavirus (2019 nCoV)    Coronavirus HKU1 NOT DETECTED NOT DETECTED Final   Coronavirus NL63 NOT DETECTED NOT DETECTED Final   Coronavirus OC43 NOT DETECTED NOT DETECTED Final   Metapneumovirus NOT DETECTED NOT DETECTED Final   Rhinovirus / Enterovirus NOT DETECTED NOT DETECTED Final   Influenza A NOT DETECTED NOT DETECTED Final   Influenza B NOT DETECTED NOT DETECTED Final   Parainfluenza Virus 1 NOT DETECTED NOT DETECTED Final   Parainfluenza Virus 2 NOT DETECTED NOT DETECTED Final   Parainfluenza Virus 3 NOT DETECTED NOT DETECTED Final   Parainfluenza Virus 4 NOT DETECTED NOT DETECTED Final   Respiratory Syncytial Virus NOT DETECTED NOT DETECTED Final   Bordetella pertussis NOT DETECTED NOT DETECTED Final   Bordetella Parapertussis NOT DETECTED NOT DETECTED Final   Chlamydophila pneumoniae NOT DETECTED NOT DETECTED Final   Mycoplasma pneumoniae NOT DETECTED NOT DETECTED  Final    Comment: Performed at [facility], [place]. [address], [place], [place] [address]  Blood culture (routine x 2)     Status: None   Collection Time: 05/11/22  3:43 AM   Specimen: BLOOD LEFT HAND  Result Value Ref Range Status   Specimen Description BLOOD LEFT HAND  Final   Special Requests   Final    BOTTLES DRAWN AEROBIC AND ANAEROBIC Blood Culture results may not be optimal due to an inadequate volume of blood received in culture bottles   Culture   Final    NO GROWTH 5 DAYS Performed at [facility], [place] [address], [place], [place] [address]    Report Status 05/16/2022 FINAL  Final  MRSA Next Gen by PCR, Nasal     Status: None   Collection Time: 05/13/22 11:37 AM   Specimen: Nasal Mucosa; Nasal Swab  Result Value Ref Range Status   MRSA by PCR Next Gen NOT DETECTED NOT DETECTED Final    Comment: (NOTE) The GeneXpert MRSA Assay (FDA approved for NASAL specimens only), is one component of a comprehensive MRSA colonization surveillance program. It is not intended to diagnose MRSA infection nor to guide or monitor treatment for MRSA infections. Test performance is not FDA approved in patients less than 74 years old. Performed at [facility], [place] [address] [place], [place] [address]     Today   Subjective    Gabriella Guile today has no headache,no chest abdominal pain,no new weakness tingling or numbness, feels much better.    Objective   Blood pressure 106/60, pulse 73, temperature 97.8 F (36.6 C), temperature source Axillary, resp. rate 20, height 6' (1.829 m), weight 119.4 kg, SpO2 96 %.   Intake/Output Summary (Last 24 hours) at 05/18/2022 0918 Last data filed at 05/18/2022 0602 Gross per 24 hour  Intake 1324.66 ml  Output 1500 ml  Net -175.34 ml    Exam  Awake Alert, No  new F.N deficits,   left arm PICC line in place [email or web address],PERRAL Supple Neck,   Symmetrical Chest wall movement, Good air movement bilaterally, CTAB RRR,No Gallops,    +ve B.Sounds, Abd Soft, Non tender,  No Cyanosis, Clubbing or edema    Data Review   Recent Labs  Lab 05/14/22 0455 05/15/22 0442 05/16/22 0514 05/17/22 0407 05/18/22 0437  WBC 9.2 9.8 9.7 10.4 9.7  HGB 9.8* 9.7* 9.1* 9.4* 9.5*  HCT 29.5* 29.0* 29.1* 28.9* 29.0*  PLT 259 267 287 302 303  MCV 94.2 94.8 98.0 94.4 95.7  MCH 31.3 31.7 30.6 30.7 31.4  MCHC 33.2 33.4 31.3 32.5 32.8  RDW 13.7 13.6 13.9 13.6 13.6  LYMPHSABS 1.7 1.3 1.6 1.8 1.7  MONOABS 1.6* 1.6* 1.5* 1.7* 1.6*  EOSABS 0.5 0.4 0.2 0.3 0.4  BASOSABS 0.0 0.0 0.0 0.0 0.0    Recent Labs  Lab 05/14/22 0455 05/15/22 0442 05/16/22 0514 05/17/22 0407 05/18/22 0437  NA 135 137 140 141 140  K 3.3* 3.6 4.1 3.8 3.5  CL 99 102 101 97* 95*  CO2 '24 28 31 '$ 34* 39*  ANIONGAP '12 7 8 10 6  '$ GLUCOSE 116* 128* 117* 117* 106*  BUN 24* '19 15 15 16  '$ CREATININE 1.06 0.85 0.71 0.79 0.90  CRP 15.5* 12.8* 11.7* 12.6*  --   PROCALCITON 0.16 0.13 0.12 0.17 0.14  BNP 1,215.6* 1,211.7* 1,276.1* 1,120.9*  --   MG 1.9 1.9 1.8 1.6*  --   CALCIUM 7.9* 8.3* 8.6* 8.8* 8.5*     Total Time in preparing paper work, data evaluation and todays exam - 35 minutes  Signature  -    Lala Lund M.D on 05/18/2022 at 9:18 AM   -  To page go to [email or web address]

## 2022-05-18 NOTE — Progress Notes (Addendum)
SLP Cancellation Note  Patient Details Name: Douglas Perkins. MRN: [id number] DOB: May 18, 1945   Cancelled treatment:       Reason Eval/Treat Not Completed: Medical issues which prohibited therapy. Attempted to see pt x2 but he has been on BiPAP. Will f/u as able.    [name]., M.A. [place] Office [phone]  Secure chat preferred  05/18/2022, 10:22 AM

## 2022-05-18 NOTE — Progress Notes (Signed)
PATIENT DETAILS Name: Douglas Perkins. Age: 77 y.o. Sex: male Date of Birth: 04-11-1946 Admit Date: 05/11/2022 Admitting Physician Kayleen Memos, DO PCP:Pcp, No  Brief Summary: Patient is a 77 y.o.  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   Significant events: 12/10-12/22>> hospitalization for group B strep bacteremia with pacemaker lead implantation.  Discharged on IV penicillin-end date 1/29. 01/11>> admit to [facility]-hypoxia due to PNA-initially on BiPAP.  Liberated off BiPAP post admission 01/12>> on around 8-10 L of HFNC. 01/13>> Down to 6 L of HFNC   Significant studies: 01/11>> CXR: Right> left infiltrates 01/12>> TTE: EF 55-60%, no obvious vegetation-but limited evaluation due to poor windows.   Significant microbiology data: 1/11>> COVID/influenza/RSV PCR: Negative 1/11>> blood culture: No growth 1/12>> respiratory virus panel: neg   Procedures: 12/15>> TEE: EF 55-60%-no valvular vegetation visualized 12/18>> permanent pacemaker lead explantation-implantation of leadless pacemaker.   Consults: ID  Subjective:  Overnight: NAEON  States doing well. No acute concerns.   Objective: Slightly hypertensive. Vital signs in last 24 hours: Vitals:   05/17/22 1928 05/17/22 1936 05/17/22 2300 05/18/22 0302  BP: 109/68  110/66 (!) 172/68  Pulse: 75  67 76  Resp: 20  (!) 22 (!) 25  Temp: 98 F (36.7 C)  98.1 F (36.7 C) 98 F (36.7 C)  TempSrc: Oral  Oral Oral  SpO2: 93% 98% 96% 100%  Weight:      Height:       Supplemental O2: BiPAP Last BM Date : 05/11/22 SpO2: 100 % O2 Flow Rate (L/min): 6 L/min FiO2 (%): 50 % Filed Weights   05/15/22 0500 05/16/22 0747  05/17/22 0500  Weight: 122.9 kg 122.4 kg 119.4 kg    Intake/Output Summary (Last 24 hours) at 05/18/2022 0714 Last data filed at 05/18/2022 0602 Gross per 24 hour  Intake 1654.66 ml  Output 2600 ml  Net -945.34 ml    Net IO Since Admission: -1,956.53 mL [05/18/22 0714] Physical Exam  General: NAD, on CPAP HENT: NCAT Lungs:  CTAB Cardiovascular: NSR Abdomen: No TTP MSK: lower extremities with improved edema Neuro: alert and oriented   Diagnostics    Latest Ref Rng & Units 05/18/2022    4:37 AM 05/17/2022    4:07 AM 05/16/2022    5:14 AM  CBC  WBC 4.0 - 10.5 K/uL 9.7  10.4  9.7   Hemoglobin 13.0 - 17.0 g/dL 9.5  9.4  9.1   Hematocrit 39.0 - 52.0 % 29.0  28.9  29.1   Platelets 150 - 400 K/uL 303  302  287        Latest Ref Rng & Units 05/18/2022    4:37 AM 05/17/2022    4:07 AM 05/16/2022    5:14 AM  BMP  Glucose 70 - 99 mg/dL 106  117  117   BUN 8 - 23 mg/dL '16  15  15   '$ Creatinine 0.61 - 1.24 mg/dL 0.90  0.79  0.71   Sodium 135 - 145 mmol/L 140  141  140   Potassium 3.5 - 5.1 mmol/L 3.5  3.8  4.1   Chloride 98 - 111 mmol/L 95  97  101   CO2 22 - 32 mmol/L 39  34  31   Calcium  8.9 - 10.3 mg/dL 8.5  8.8  8.6    Procal 0.14 Assessment/Plan: Patient is a 77 y.o.  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    Principal Problem:   Multifocal pneumonia Active Problems:   Bipolar I disorder ([place])   Hyponatremia   Streptococcal bacteremia   Acute respiratory failure with hypoxia (HCC)   Heart failure with preserved ejection fraction (HCC)   Leukocytosis   Normocytic anemia  Acute hypoxic respiratory failure due to combination of multifocal PNA and HFpEF  exacerbation Hypoxia improving-initially on BiPAP but now on . Clinical picture more consistent with CHF exacerbation. Received 60 mg IV lasix BID with 2.6 L output. Will continue IV diuresis with lasix 60 mg BID. Getting PCN 12 million units q12 hrs with stop date of 05/29/2022.  -Continue antibiotics/diuretics. Will continue IV lasix 60 mg BID given good urine output.    Hypokalemia/Hypomagnesemia Replete electrolytes with goal of 4 and 2 for K and Mag. K at 3.5. Will replete today.    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 Was previously on IV penicillin V through 1/29.  Currently on 12 million units PCN q12 hrs. Plan to finish IV abx through PICC line.     History of CAD Chronic. No angina-no wall motion abnormality on recent echo -Continue aspirin/statin/Coreg   History of symptomatic sinus node dysfunction Now with leadless pacemaker in place. -Continue telemetry monitoring   HTN Chronic. On Coreg and Losartan at home.  -Continue Coreg-resumed at a much lower dose of 20 mg qd.  -Continue to hold losartan to allow room for diuresis.     Normocytic anemia Improving. Due to acute illness -Transfuse if Hb<7   Bipolar disorder Chronic. Appears stable -Continue Seroquel/Depakote/Neurontin.   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   Weight as of this encounter: 122.4 kg.     Diet: D3D IVF: None,None VTE: Enoxaparin Code: Full PT/OT recs: SNF for Subacute PT, other pending SNF. Prior to Admission Living Arrangement: Home Anticipated Discharge Location: SNF Barriers to Discharge: Medical work up Dispo: Anticipated discharge in  approximately 1 day(s).   Idamae Schuller, MD Tillie Rung. [facility] Internal Medicine Residency, PGY-2  Pager: [phone] After 5 pm and on weekends: Please call the on-call pager

## 2022-05-18 NOTE — Progress Notes (Signed)
Pt placed on BIPAP for rest. Pt tolerating well at this time with svs

## 2022-05-18 NOTE — Plan of Care (Signed)
  Problem: Education: Goal: Knowledge of cardiac device and self-care will improve Outcome: Progressing Goal: Ability to safely manage health related needs after discharge will improve Outcome: Progressing Goal: Individualized Educational Video(s) Outcome: Progressing   Problem: Cardiac: Goal: Ability to achieve and maintain adequate cardiopulmonary perfusion will improve Outcome: Progressing   

## 2022-05-18 NOTE — Progress Notes (Signed)
Heart Failure Navigator Progress Note  Assessed for Heart & Vascular TOC clinic readiness.  Patient does not meet criteria due to principal issue multifocal pneumonia with some CHF involvement. Pt is established at [facility], spoke with clinic to get appt scheduled with cardiology team and PCP within 2 weeks. Pt DC to SNF and continue abx via PICC.   EF: 55-60%.   Navigator available for reassessment of patient.   Pricilla Holm, MSN, RN Heart Failure Nurse Navigator

## 2022-05-18 NOTE — Discharge Instructions (Signed)
Follow with Primary MD in 7 days   Get CBC, CMP, 2 view Chest X ray -  checked next visit with your primary MD or SNF MD    Activity: As tolerated with Full fall precautions use walker/cane & assistance as needed  Disposition SNF  Diet: Dysphagia 3 diet with nectar thick liquids, feeding assistance and aspiration precautions, 1.5 L fluid restriction per day.  Check your Weight same time everyday, if you gain over 2 pounds, or you develop in leg swelling, experience more shortness of breath or chest pain, call your Primary MD immediately. Follow Cardiac Low Salt Diet and 1.5 lit/day fluid restriction.  Special Instructions: If you have smoked or chewed Tobacco  in the last 2 yrs please stop smoking, stop any regular Alcohol  and or any Recreational drug use.  On your next visit with your primary care physician please Get Medicines reviewed and adjusted.  Please request your Prim.MD to go over all Hospital Tests and Procedure/Radiological results at the follow up, please get all Hospital records sent to your Prim MD by signing hospital release before you go home.  If you experience worsening of your admission symptoms, develop shortness of breath, life threatening emergency, suicidal or homicidal thoughts you must seek medical attention immediately by calling 911 or calling your MD immediately  if symptoms less severe.  You Must read complete instructions/literature along with all the possible adverse reactions/side effects for all the Medicines you take and that have been prescribed to you. Take any new Medicines after you have completely understood and accpet all the possible adverse reactions/side effects.

## 2022-05-20 ENCOUNTER — Encounter (HOSPITAL_COMMUNITY): Payer: Self-pay | Admitting: Emergency Medicine

## 2022-05-20 ENCOUNTER — Inpatient Hospital Stay (HOSPITAL_COMMUNITY)
Admission: EM | Admit: 2022-05-20 | Discharge: 2022-05-27 | DRG: 291 | Disposition: A | Discharge status: Skilled Nursing Facility | Payer: Medicare Other | Source: Skilled Nursing Facility | Attending: Family Medicine | Admitting: Family Medicine

## 2022-05-20 DIAGNOSIS — T827XXA Infection and inflammatory reaction due to other cardiac and vascular devices, implants and grafts, initial encounter: Secondary | ICD-10-CM | POA: Diagnosis present

## 2022-05-20 DIAGNOSIS — G4733 Obstructive sleep apnea (adult) (pediatric): Secondary | ICD-10-CM | POA: Diagnosis present

## 2022-05-20 DIAGNOSIS — G9341 Metabolic encephalopathy: Secondary | ICD-10-CM | POA: Diagnosis not present

## 2022-05-20 DIAGNOSIS — I110 Hypertensive heart disease with heart failure: Principal | ICD-10-CM | POA: Diagnosis present

## 2022-05-20 DIAGNOSIS — J9621 Acute and chronic respiratory failure with hypoxia: Secondary | ICD-10-CM | POA: Diagnosis present

## 2022-05-20 DIAGNOSIS — H409 Unspecified glaucoma: Secondary | ICD-10-CM | POA: Diagnosis present

## 2022-05-20 DIAGNOSIS — I4510 Unspecified right bundle-branch block: Secondary | ICD-10-CM | POA: Diagnosis present

## 2022-05-20 DIAGNOSIS — I10 Essential (primary) hypertension: Secondary | ICD-10-CM | POA: Diagnosis present

## 2022-05-20 DIAGNOSIS — J189 Pneumonia, unspecified organism: Secondary | ICD-10-CM | POA: Diagnosis present

## 2022-05-20 DIAGNOSIS — N400 Enlarged prostate without lower urinary tract symptoms: Secondary | ICD-10-CM | POA: Diagnosis present

## 2022-05-20 DIAGNOSIS — Z9049 Acquired absence of other specified parts of digestive tract: Secondary | ICD-10-CM

## 2022-05-20 DIAGNOSIS — Z8546 Personal history of malignant neoplasm of prostate: Secondary | ICD-10-CM

## 2022-05-20 DIAGNOSIS — Z7982 Long term (current) use of aspirin: Secondary | ICD-10-CM

## 2022-05-20 DIAGNOSIS — F319 Bipolar disorder, unspecified: Secondary | ICD-10-CM | POA: Diagnosis present

## 2022-05-20 DIAGNOSIS — J440 Chronic obstructive pulmonary disease with acute lower respiratory infection: Secondary | ICD-10-CM | POA: Diagnosis present

## 2022-05-20 DIAGNOSIS — Y831 Surgical operation with implant of artificial internal device as the cause of abnormal reaction of the patient, or of later complication, without mention of misadventure at the time of the procedure: Secondary | ICD-10-CM | POA: Diagnosis present

## 2022-05-20 DIAGNOSIS — Z8673 Personal history of transient ischemic attack (TIA), and cerebral infarction without residual deficits: Secondary | ICD-10-CM

## 2022-05-20 DIAGNOSIS — Z9981 Dependence on supplemental oxygen: Secondary | ICD-10-CM

## 2022-05-20 DIAGNOSIS — G9340 Encephalopathy, unspecified: Secondary | ICD-10-CM | POA: Insufficient documentation

## 2022-05-20 DIAGNOSIS — Z8679 Personal history of other diseases of the circulatory system: Secondary | ICD-10-CM

## 2022-05-20 DIAGNOSIS — Z6835 Body mass index (BMI) 35.0-35.9, adult: Secondary | ICD-10-CM

## 2022-05-20 DIAGNOSIS — B951 Streptococcus, group B, as the cause of diseases classified elsewhere: Secondary | ICD-10-CM | POA: Diagnosis present

## 2022-05-20 DIAGNOSIS — R7989 Other specified abnormal findings of blood chemistry: Secondary | ICD-10-CM

## 2022-05-20 DIAGNOSIS — Z79899 Other long term (current) drug therapy: Secondary | ICD-10-CM

## 2022-05-20 DIAGNOSIS — I2510 Atherosclerotic heart disease of native coronary artery without angina pectoris: Secondary | ICD-10-CM | POA: Diagnosis present

## 2022-05-20 DIAGNOSIS — J441 Chronic obstructive pulmonary disease with (acute) exacerbation: Secondary | ICD-10-CM

## 2022-05-20 DIAGNOSIS — Z823 Family history of stroke: Secondary | ICD-10-CM

## 2022-05-20 DIAGNOSIS — M1990 Unspecified osteoarthritis, unspecified site: Secondary | ICD-10-CM | POA: Diagnosis present

## 2022-05-20 DIAGNOSIS — E669 Obesity, unspecified: Secondary | ICD-10-CM | POA: Diagnosis present

## 2022-05-20 DIAGNOSIS — R0602 Shortness of breath: Secondary | ICD-10-CM | POA: Diagnosis not present

## 2022-05-20 DIAGNOSIS — T43595A Adverse effect of other antipsychotics and neuroleptics, initial encounter: Secondary | ICD-10-CM | POA: Diagnosis not present

## 2022-05-20 DIAGNOSIS — C4A9 Merkel cell carcinoma, unspecified: Secondary | ICD-10-CM | POA: Diagnosis present

## 2022-05-20 DIAGNOSIS — Z95828 Presence of other vascular implants and grafts: Secondary | ICD-10-CM

## 2022-05-20 DIAGNOSIS — Z882 Allergy status to sulfonamides status: Secondary | ICD-10-CM

## 2022-05-20 DIAGNOSIS — I495 Sick sinus syndrome: Secondary | ICD-10-CM | POA: Diagnosis present

## 2022-05-20 DIAGNOSIS — H9190 Unspecified hearing loss, unspecified ear: Secondary | ICD-10-CM | POA: Diagnosis present

## 2022-05-20 DIAGNOSIS — R7881 Bacteremia: Secondary | ICD-10-CM | POA: Diagnosis present

## 2022-05-20 DIAGNOSIS — I2489 Other forms of acute ischemic heart disease: Secondary | ICD-10-CM | POA: Diagnosis present

## 2022-05-20 DIAGNOSIS — E873 Alkalosis: Secondary | ICD-10-CM | POA: Diagnosis not present

## 2022-05-20 DIAGNOSIS — Z923 Personal history of irradiation: Secondary | ICD-10-CM

## 2022-05-20 DIAGNOSIS — R5381 Other malaise: Secondary | ICD-10-CM | POA: Diagnosis present

## 2022-05-20 DIAGNOSIS — L89610 Pressure ulcer of right heel, unstageable: Secondary | ICD-10-CM | POA: Diagnosis present

## 2022-05-20 DIAGNOSIS — R0902 Hypoxemia: Principal | ICD-10-CM

## 2022-05-20 DIAGNOSIS — Z85821 Personal history of Merkel cell carcinoma: Secondary | ICD-10-CM

## 2022-05-20 DIAGNOSIS — Z1152 Encounter for screening for COVID-19: Secondary | ICD-10-CM

## 2022-05-20 DIAGNOSIS — Z950 Presence of cardiac pacemaker: Secondary | ICD-10-CM | POA: Diagnosis present

## 2022-05-20 DIAGNOSIS — J398 Other specified diseases of upper respiratory tract: Secondary | ICD-10-CM

## 2022-05-20 DIAGNOSIS — I5033 Acute on chronic diastolic (congestive) heart failure: Secondary | ICD-10-CM | POA: Insufficient documentation

## 2022-05-20 DIAGNOSIS — J9601 Acute respiratory failure with hypoxia: Secondary | ICD-10-CM | POA: Diagnosis present

## 2022-05-20 DIAGNOSIS — Z8249 Family history of ischemic heart disease and other diseases of the circulatory system: Secondary | ICD-10-CM

## 2022-05-20 DIAGNOSIS — E782 Mixed hyperlipidemia: Secondary | ICD-10-CM | POA: Diagnosis present

## 2022-05-20 DIAGNOSIS — Z888 Allergy status to other drugs, medicaments and biological substances status: Secondary | ICD-10-CM

## 2022-05-20 LAB — CBC WITH DIFFERENTIAL/PLATELET
Abs Immature Granulocytes: 0.06 10*3/uL (ref 0.00–0.07)
Basophils Absolute: 0 10*3/uL (ref 0.0–0.1)
Basophils Relative: 0 %
Eosinophils Absolute: 0 10*3/uL (ref 0.0–0.5)
Eosinophils Relative: 0 %
HCT: 32.4 % — ABNORMAL LOW (ref 39.0–52.0)
Hemoglobin: 10.8 g/dL — ABNORMAL LOW (ref 13.0–17.0)
Immature Granulocytes: 0 %
Lymphocytes Relative: 7 %
Lymphs Abs: 1 10*3/uL (ref 0.7–4.0)
MCH: 31 pg (ref 26.0–34.0)
MCHC: 33.3 g/dL (ref 30.0–36.0)
MCV: 93.1 fL (ref 80.0–100.0)
Monocytes Absolute: 1.2 10*3/uL — ABNORMAL HIGH (ref 0.1–1.0)
Monocytes Relative: 9 %
Neutro Abs: 11.1 10*3/uL — ABNORMAL HIGH (ref 1.7–7.7)
Neutrophils Relative %: 84 %
Platelets: 341 10*3/uL (ref 150–400)
RBC: 3.48 MIL/uL — ABNORMAL LOW (ref 4.22–5.81)
RDW: 13 % (ref 11.5–15.5)
WBC: 13.4 10*3/uL — ABNORMAL HIGH (ref 4.0–10.5)
nRBC: 0 % (ref 0.0–0.2)

## 2022-05-20 LAB — BASIC METABOLIC PANEL
Anion gap: 12 (ref 5–15)
BUN: 9 mg/dL (ref 8–23)
CO2: 32 mmol/L (ref 22–32)
Calcium: 8.5 mg/dL — ABNORMAL LOW (ref 8.9–10.3)
Chloride: 94 mmol/L — ABNORMAL LOW (ref 98–111)
Creatinine, Ser: 0.68 mg/dL (ref 0.61–1.24)
GFR, Estimated: >60 mL/min (ref >60)
Glucose, Bld: 168 mg/dL — ABNORMAL HIGH (ref 70–99)
Potassium: 3.2 mmol/L — ABNORMAL LOW (ref 3.5–5.1)
Sodium: 138 mmol/L (ref 135–145)

## 2022-05-20 LAB — RESP PANEL BY RT-PCR (RSV, FLU A&B, COVID)  RVPGX2
Influenza A by PCR: NEGATIVE
Influenza B by PCR: NEGATIVE
Resp Syncytial Virus by PCR: NEGATIVE
SARS Coronavirus 2 by RT PCR: NEGATIVE

## 2022-05-20 LAB — BRAIN NATRIURETIC PEPTIDE: B Natriuretic Peptide: 1662.2 pg/mL — ABNORMAL HIGH (ref 0.0–100.0)

## 2022-05-20 LAB — D-DIMER, QUANTITATIVE: D-Dimer, Quant: 1.38 ug/mL-FEU — ABNORMAL HIGH (ref 0.00–0.50)

## 2022-05-20 LAB — TROPONIN I (HIGH SENSITIVITY): Troponin I (High Sensitivity): 81 ng/L — ABNORMAL HIGH (ref <18)

## 2022-05-20 NOTE — ED Triage Notes (Signed)
Per EMS, pt from [facility], Wife made him come b/c "he was breathing funny."  EMS found him in the low 80s on home O12 of 4L.  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  156/96 98% on neb CBG 272

## 2022-05-20 NOTE — ED Provider Notes (Signed)
[facility] Provider Note   CSN: [id number] Arrival date & time: 05/20/22  2154     History  Chief Complaint  Patient presents with   Shortness of Breath    Jailin Moomaw. is a 77 y.o. male.  The history is provided by the patient, the EMS personnel and medical records. No language interpreter was used.  Shortness of Breath    77 year old male with significant history of hypertension, TIA, cancer, bipolar, has pacemaker lives at a nursing facility brought here via EMS with concerns of shortness of breath.  Per EMS, patient recently diagnosed with pneumonia approximately a week ago.  He is normally on home oxygen at 4 L.  Today staff noticed that his oxygen level was in the 80s prompting this ER visit.  When EMS arrived, patient was placed on nonrebreather with steady improvement of his oxygen level.  Patient overall denies having any worsening symptoms.  States he felt fine except for some arthritis pain to his joints.  He does not complaining of any significant chest pain abdominal pain.  Home Medications Prior to Admission medications   Medication Sig Start Date End Date Taking? Authorizing Provider  acetaminophen (TYLENOL) 500 MG tablet Take 1,000 mg by mouth in the morning and at bedtime. May also 2 tablets by mouth daily as needed for knee pain    [provider]  albuterol (VENTOLIN HFA) 108 (90 Base) MCG/ACT inhaler Inhale 1 puff into the lungs every 8 (eight) hours as needed for wheezing or shortness of breath. 08/26/21   [provider]  aspirin EC 81 MG tablet Take 1 tablet (81 mg total) by mouth daily. Reported on 08/18/2015 03/09/21   Clapacs, Madie Reno, MD  atorvastatin (LIPITOR) 40 MG tablet Take 1 tablet (40 mg total) by mouth daily. Patient taking differently: Take 40 mg by mouth at bedtime. 03/10/21   Clapacs, Madie Reno, MD  bisacodyl (DULCOLAX) 5 MG EC tablet Take 1 tablet (5 mg total) by mouth daily as needed  for moderate constipation. 03/09/21   Clapacs, Madie Reno, MD  carvedilol (COREG CR) 10 MG 24 hr capsule Take 1 capsule (10 mg total) by mouth daily. 05/18/22   Thurnell Lose, MD  dextromethorphan 7.5 MG/5ML SYRP Take 15 mg by mouth every 6 (six) hours as needed (for cough).    [provider]  divalproex (DEPAKOTE) 500 MG DR tablet Take 2 tablets (1,000 mg total) by mouth at bedtime. 03/09/21   Clapacs, Madie Reno, MD  dorzolamide (TRUSOPT) 2 % ophthalmic solution Place 1 drop into the right eye 2 (two) times daily. 03/09/21   Clapacs, Madie Reno, MD  fluticasone-salmeterol (ADVAIR HFA) [phone] MCG/ACT inhaler Inhale 2 puffs into the lungs 2 (two) times daily.    [provider]  furosemide (LASIX) 20 MG tablet Take 1 tablet (20 mg total) by mouth daily. 05/18/22 06/17/22  Thurnell Lose, MD  gabapentin (NEURONTIN) 300 MG capsule Take 1 capsule (300 mg total) by mouth 2 (two) times daily. 03/09/21   Clapacs, Madie Reno, MD  ipratropium-albuterol (DUONEB) 0.5-2.5 (3) MG/3ML SOLN Take 3 mLs by nebulization once.    [provider]  LACTOBACILLUS PROBIOTIC PO Take 1 capsule by mouth daily.    [provider]  latanoprost (XALATAN) 0.005 % ophthalmic solution Place 1 drop into the right eye 2 (two) times daily. 03/09/21   Clapacs, Madie Reno, MD  Melatonin 10 MG TABS Take 10 mg by mouth  at bedtime.    [provider]  Multiple Vitamins-Minerals (MULTIVITAL PO) Take 1 tablet by mouth daily.    [provider]  nystatin powder Apply 1 Application topically 2 (two) times daily. Apply topically twice a day to groin region, bilateral buttocks, sacrum and inner upper thighs    [provider]  pantoprazole (PROTONIX) 40 MG tablet Take 1 tablet (40 mg total) by mouth 2 (two) times daily. 03/09/21   Clapacs, Madie Reno, MD  penicillin G IVPB Inject 24 Million Units into the vein daily. Indication:  GBS ICD infection First Dose: Yes Last Day of Therapy:  05/29/22 Labs - Once  weekly:  CBC/D and BMP, Labs - Every other week:  ESR and CRP Method of administration: Elastomeric (Continuous infusion) Pull PICC line at the completion of IV antibiotics Method of administration may be changed at the discretion of home infusion pharmacist based upon assessment of the patient and/or caregiver's ability to self-administer the medication ordered. 04/20/22 05/31/22  Patrecia Pour, MD  potassium chloride (KLOR-CON) 10 MEQ tablet Take 1 tablet (10 mEq total) by mouth daily. 05/18/22   Thurnell Lose, MD  QUEtiapine (SEROQUEL) 300 MG tablet Take 1 tablet (300 mg total) by mouth at bedtime. 03/09/21   Clapacs, Madie Reno, MD  tamsulosin (FLOMAX) 0.4 MG CAPS capsule Take 0.4 mg by mouth at bedtime.    [provider]  timolol (TIMOPTIC) 0.5 % ophthalmic solution Place 1 drop into the right eye 2 (two) times daily. 03/09/21   Clapacs, Madie Reno, MD      Allergies    Sulfa antibiotics and Feldene [piroxicam]    Review of Systems   Review of Systems  Respiratory:  Positive for shortness of breath.   All other systems reviewed and are negative.   Physical Exam Updated Vital Signs BP (!) 152/50   Pulse 79   Temp (!) 97.3 F (36.3 C) (Oral)   Resp 18   Ht 6' (1.829 m)   Wt 119.4 kg   SpO2 (!) 89%   BMI 35.70 kg/m  Physical Exam Vitals and nursing note reviewed.  Constitutional:      General: He is not in acute distress.    Appearance: He is well-developed.  HENT:     Head: Atraumatic.  Eyes:     Conjunctiva/sclera: Conjunctivae normal.  Cardiovascular:     Rate and Rhythm: Normal rate and regular rhythm.  Pulmonary:     Breath sounds: Decreased breath sounds present. No wheezing, rhonchi or rales.  Chest:     Chest wall: No tenderness.  Musculoskeletal:     Cervical back: Neck supple.     Comments: L arm: PICC line in place, no evidence of surrounding skin infection  Skin:    Findings: No rash.  Neurological:     Mental Status: He is alert.     ED  Results / Procedures / Treatments   Labs (all labs ordered are listed, but only abnormal results are displayed) Labs Reviewed  BASIC METABOLIC PANEL - Abnormal; Notable for the following components:      Result Value   Potassium 3.2 (*)    Chloride 94 (*)    Glucose, Bld 168 (*)    Calcium 8.5 (*)    All other components within normal limits  CBC WITH DIFFERENTIAL/PLATELET - Abnormal; Notable for the following components:   WBC 13.4 (*)    RBC 3.48 (*)    Hemoglobin 10.8 (*)    HCT 32.4 (*)  Neutro Abs 11.1 (*)    Monocytes Absolute 1.2 (*)    All other components within normal limits  BRAIN NATRIURETIC PEPTIDE - Abnormal; Notable for the following components:   B Natriuretic Peptide 1,662.2 (*)    All other components within normal limits  TROPONIN I (HIGH SENSITIVITY) - Abnormal; Notable for the following components:   Troponin I (High Sensitivity) 81 (*)    All other components within normal limits  RESP PANEL BY RT-PCR (RSV, FLU A&B, COVID)  RVPGX2  D-DIMER, QUANTITATIVE  I-STAT VENOUS BLOOD GAS, ED    EKG EKG Interpretation  Date/Time:  Saturday May 20 2022 22:06:36 EST Ventricular Rate:  86 PR Interval:  173 QRS Duration: 162 QT Interval:  405 QTC Calculation: 485 R Axis:   -58 Text Interpretation: Sinus tachycardia Atrial premature complexes Right bundle branch block LVH with IVCD and secondary repol abnrm Confirmed by Gerlene Fee [phone]) on 05/20/2022 11:30:00 PM  Radiology No results found.  Procedures .Critical Care  Performed by: Domenic Moras, PA-C Authorized by: Domenic Moras, PA-C   Critical care provider statement:    Critical care time (minutes):  45   Critical care was time spent personally by me on the following activities:  Development of treatment plan with patient or surrogate, discussions with consultants, evaluation of patient's response to treatment, examination of patient, ordering and review of laboratory studies, ordering and review of  radiographic studies, ordering and performing treatments and interventions, pulse oximetry, re-evaluation of patient's condition and review of old charts     Medications Ordered in ED Medications - No data to display  ED Course/ Medical Decision Making/ A&P                             Medical Decision Making Amount and/or Complexity of Data Reviewed Labs: ordered. Radiology: ordered.   Pulse 89   Temp (!) 97.3 F (36.3 C) (Oral)   Resp (!) 27   Ht 6' (1.829 m)   Wt 119.4 kg   SpO2 94%   BMI 35.70 kg/m   27:63 PM   77 year old male with significant history of hypertension, TIA, cancer, bipolar, has pacemaker lives at a nursing facility brought here via EMS with concerns of shortness of breath.  Per EMS, patient recently diagnosed with pneumonia approximately a week ago.  He is normally on home oxygen at 4 L.  Today staff noticed that his oxygen level was in the 80s prompting this ER visit.  When EMS arrived, patient was placed on nonrebreather with steady improvement of his oxygen level.  Patient overall denies having any worsening symptoms.  States he felt fine except for some arthritis pain to his joints.  He does not complaining of any significant chest pain abdominal pain.  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  He get discharged to the facility on IV penicillin.  On exam, this is an elderly male wearing nonrebreather appears to be in no acute respiratory discomfort.  Heart normal rate and rhythm, lungs with diminished breath sounds throughout.  Trace edema is noted to bilateral lower extremities.  Patient is alert and oriented x 4.  Abdomen soft nontender.  Since patient was just discharged 2 days prior and  here with shortness of breath, and this is likely due to failed outpatient treatment however PE is in consideration as well.  D-dimer ordered.  Workup initiated.  -Labs ordered, independently viewed and interpreted by me.  Labs remarkable for K+ 3.2, supplementation given.  WBC 13.4.  will need CXR but pt is current on abx via PICC line.  Troponin 81 and EKG showing Acute MI.  I did discussed this with Dr. Sedonia Small, and we felt EKG is inaccurate however pt is likely experiencing demand ischemia likely 2/2 to his impaired pulmonary function.  Will f/u on D-dimer and imaging -The patient was maintained on a cardiac monitor.  I personally viewed and interpreted the cardiac monitored which showed an underlying rhythm of: sinus tachycardia -Imaging have not resulted yet -This patient presents to the ED for concern of sob, this involves an extensive number of treatment options, and is a complaint that carries with it a high risk of complications and morbidity.  The differential diagnosis includes pna, chf, acs, viral illness, pe, copd -Co morbidities that complicate the patient evaluation includes HTN, cancer, TIA -Treatment includes supplemental O2 -Reevaluation of the patient after these medicines showed that the patient improved -PCP office notes or outside notes reviewed -Discussion with attending DR. Bero who will f/u on labs and determine disposition -Escalation to admission/observation considered: patient will benefit from admission after work up is done  11:51 PM Since BNP is elevated at 1662 and patient is having increased shortness of breath, with patient may benefit from BiPAP in the setting of CHF exacerbation..  I have ordered BiPAP.  D-dimer is elevated at 1.38.  I discussed this with oncoming provider who will reassess patient, follow-up on chest x-ray and will determine whether patient will need additional imaging.        Final Clinical Impression(s) / ED Diagnoses Final diagnoses:   Hypoxia  Elevated troponin    Rx / DC Orders ED Discharge Orders     None         Domenic Moras, PA-C 05/20/22 2352    Davonna Belling, MD 05/21/22 2322

## 2022-05-20 NOTE — ED Notes (Signed)
Micromedic pacemaker interrogated.

## 2022-05-21 ENCOUNTER — Inpatient Hospital Stay (HOSPITAL_COMMUNITY): Payer: Medicare Other

## 2022-05-21 ENCOUNTER — Emergency Department (HOSPITAL_COMMUNITY): Payer: Medicare Other

## 2022-05-21 DIAGNOSIS — Z1152 Encounter for screening for COVID-19: Secondary | ICD-10-CM | POA: Diagnosis not present

## 2022-05-21 DIAGNOSIS — E873 Alkalosis: Secondary | ICD-10-CM | POA: Diagnosis not present

## 2022-05-21 DIAGNOSIS — J9601 Acute respiratory failure with hypoxia: Secondary | ICD-10-CM | POA: Diagnosis not present

## 2022-05-21 DIAGNOSIS — F319 Bipolar disorder, unspecified: Secondary | ICD-10-CM

## 2022-05-21 DIAGNOSIS — Y831 Surgical operation with implant of artificial internal device as the cause of abnormal reaction of the patient, or of later complication, without mention of misadventure at the time of the procedure: Secondary | ICD-10-CM | POA: Diagnosis present

## 2022-05-21 DIAGNOSIS — L89610 Pressure ulcer of right heel, unstageable: Secondary | ICD-10-CM | POA: Diagnosis present

## 2022-05-21 DIAGNOSIS — I25118 Atherosclerotic heart disease of native coronary artery with other forms of angina pectoris: Secondary | ICD-10-CM

## 2022-05-21 DIAGNOSIS — E782 Mixed hyperlipidemia: Secondary | ICD-10-CM

## 2022-05-21 DIAGNOSIS — Z95828 Presence of other vascular implants and grafts: Secondary | ICD-10-CM | POA: Diagnosis not present

## 2022-05-21 DIAGNOSIS — I5033 Acute on chronic diastolic (congestive) heart failure: Secondary | ICD-10-CM | POA: Insufficient documentation

## 2022-05-21 DIAGNOSIS — T827XXD Infection and inflammatory reaction due to other cardiac and vascular devices, implants and grafts, subsequent encounter: Secondary | ICD-10-CM

## 2022-05-21 DIAGNOSIS — G4733 Obstructive sleep apnea (adult) (pediatric): Secondary | ICD-10-CM | POA: Diagnosis present

## 2022-05-21 DIAGNOSIS — R7989 Other specified abnormal findings of blood chemistry: Secondary | ICD-10-CM | POA: Diagnosis not present

## 2022-05-21 DIAGNOSIS — R0602 Shortness of breath: Secondary | ICD-10-CM | POA: Diagnosis present

## 2022-05-21 DIAGNOSIS — R7881 Bacteremia: Secondary | ICD-10-CM | POA: Diagnosis present

## 2022-05-21 DIAGNOSIS — I2510 Atherosclerotic heart disease of native coronary artery without angina pectoris: Secondary | ICD-10-CM | POA: Diagnosis present

## 2022-05-21 DIAGNOSIS — H409 Unspecified glaucoma: Secondary | ICD-10-CM | POA: Diagnosis present

## 2022-05-21 DIAGNOSIS — Z9981 Dependence on supplemental oxygen: Secondary | ICD-10-CM | POA: Diagnosis not present

## 2022-05-21 DIAGNOSIS — N400 Enlarged prostate without lower urinary tract symptoms: Secondary | ICD-10-CM | POA: Diagnosis present

## 2022-05-21 DIAGNOSIS — I10 Essential (primary) hypertension: Secondary | ICD-10-CM

## 2022-05-21 DIAGNOSIS — I110 Hypertensive heart disease with heart failure: Secondary | ICD-10-CM | POA: Diagnosis present

## 2022-05-21 DIAGNOSIS — J189 Pneumonia, unspecified organism: Secondary | ICD-10-CM

## 2022-05-21 DIAGNOSIS — J9621 Acute and chronic respiratory failure with hypoxia: Secondary | ICD-10-CM | POA: Diagnosis present

## 2022-05-21 DIAGNOSIS — I2489 Other forms of acute ischemic heart disease: Secondary | ICD-10-CM | POA: Diagnosis present

## 2022-05-21 DIAGNOSIS — Z8679 Personal history of other diseases of the circulatory system: Secondary | ICD-10-CM

## 2022-05-21 DIAGNOSIS — H9190 Unspecified hearing loss, unspecified ear: Secondary | ICD-10-CM | POA: Diagnosis present

## 2022-05-21 DIAGNOSIS — Z85821 Personal history of Merkel cell carcinoma: Secondary | ICD-10-CM

## 2022-05-21 DIAGNOSIS — E669 Obesity, unspecified: Secondary | ICD-10-CM | POA: Diagnosis present

## 2022-05-21 DIAGNOSIS — R5381 Other malaise: Secondary | ICD-10-CM

## 2022-05-21 DIAGNOSIS — J440 Chronic obstructive pulmonary disease with acute lower respiratory infection: Secondary | ICD-10-CM | POA: Diagnosis present

## 2022-05-21 DIAGNOSIS — T827XXA Infection and inflammatory reaction due to other cardiac and vascular devices, implants and grafts, initial encounter: Secondary | ICD-10-CM | POA: Diagnosis present

## 2022-05-21 DIAGNOSIS — Z950 Presence of cardiac pacemaker: Secondary | ICD-10-CM

## 2022-05-21 DIAGNOSIS — J441 Chronic obstructive pulmonary disease with (acute) exacerbation: Secondary | ICD-10-CM | POA: Diagnosis present

## 2022-05-21 DIAGNOSIS — B951 Streptococcus, group B, as the cause of diseases classified elsewhere: Secondary | ICD-10-CM

## 2022-05-21 DIAGNOSIS — I495 Sick sinus syndrome: Secondary | ICD-10-CM | POA: Diagnosis present

## 2022-05-21 DIAGNOSIS — G9341 Metabolic encephalopathy: Secondary | ICD-10-CM | POA: Diagnosis not present

## 2022-05-21 LAB — CBC WITH DIFFERENTIAL/PLATELET
Abs Immature Granulocytes: 0.05 10*3/uL (ref 0.00–0.07)
Basophils Absolute: 0 10*3/uL (ref 0.0–0.1)
Basophils Relative: 0 %
Eosinophils Absolute: 0.1 10*3/uL (ref 0.0–0.5)
Eosinophils Relative: 1 %
HCT: 28.6 % — ABNORMAL LOW (ref 39.0–52.0)
Hemoglobin: 9 g/dL — ABNORMAL LOW (ref 13.0–17.0)
Immature Granulocytes: 0 %
Lymphocytes Relative: 18 %
Lymphs Abs: 2.1 10*3/uL (ref 0.7–4.0)
MCH: 30.3 pg (ref 26.0–34.0)
MCHC: 31.5 g/dL (ref 30.0–36.0)
MCV: 96.3 fL (ref 80.0–100.0)
Monocytes Absolute: 1.2 10*3/uL — ABNORMAL HIGH (ref 0.1–1.0)
Monocytes Relative: 11 %
Neutro Abs: 8.1 10*3/uL — ABNORMAL HIGH (ref 1.7–7.7)
Neutrophils Relative %: 70 %
Platelets: 348 10*3/uL (ref 150–400)
RBC: 2.97 MIL/uL — ABNORMAL LOW (ref 4.22–5.81)
RDW: 13.2 % (ref 11.5–15.5)
WBC: 11.6 10*3/uL — ABNORMAL HIGH (ref 4.0–10.5)
nRBC: 0 % (ref 0.0–0.2)

## 2022-05-21 LAB — MRSA NEXT GEN BY PCR, NASAL: MRSA by PCR Next Gen: DETECTED — AB

## 2022-05-21 LAB — RENAL FUNCTION PANEL
Albumin: 2.1 g/dL — ABNORMAL LOW (ref 3.5–5.0)
Anion gap: 6 (ref 5–15)
BUN: 12 mg/dL (ref 8–23)
CO2: 37 mmol/L — ABNORMAL HIGH (ref 22–32)
Calcium: 8.3 mg/dL — ABNORMAL LOW (ref 8.9–10.3)
Chloride: 97 mmol/L — ABNORMAL LOW (ref 98–111)
Creatinine, Ser: 0.79 mg/dL (ref 0.61–1.24)
GFR, Estimated: >60 mL/min (ref >60)
Glucose, Bld: 107 mg/dL — ABNORMAL HIGH (ref 70–99)
Phosphorus: 2.9 mg/dL (ref 2.5–4.6)
Potassium: 3.2 mmol/L — ABNORMAL LOW (ref 3.5–5.1)
Sodium: 140 mmol/L (ref 135–145)

## 2022-05-21 LAB — I-STAT ARTERIAL BLOOD GAS, ED
Acid-Base Excess: 11 mmol/L — ABNORMAL HIGH (ref 0.0–2.0)
Bicarbonate: 36.3 mmol/L — ABNORMAL HIGH (ref 20.0–28.0)
Calcium, Ion: 1.17 mmol/L (ref 1.15–1.40)
HCT: 29 % — ABNORMAL LOW (ref 39.0–52.0)
Hemoglobin: 9.9 g/dL — ABNORMAL LOW (ref 13.0–17.0)
O2 Saturation: 97 %
Patient temperature: 98.6
Potassium: 3.4 mmol/L — ABNORMAL LOW (ref 3.5–5.1)
Sodium: 139 mmol/L (ref 135–145)
TCO2: 38 mmol/L — ABNORMAL HIGH (ref 22–32)
pCO2 arterial: 49.5 mmHg — ABNORMAL HIGH (ref 32–48)
pH, Arterial: 7.473 — ABNORMAL HIGH (ref 7.35–7.45)
pO2, Arterial: 83 mmHg (ref 83–108)

## 2022-05-21 LAB — I-STAT VENOUS BLOOD GAS, ED
Acid-Base Excess: 12 mmol/L — ABNORMAL HIGH (ref 0.0–2.0)
Bicarbonate: 37.2 mmol/L — ABNORMAL HIGH (ref 20.0–28.0)
Calcium, Ion: 1.11 mmol/L — ABNORMAL LOW (ref 1.15–1.40)
HCT: 32 % — ABNORMAL LOW (ref 39.0–52.0)
Hemoglobin: 10.9 g/dL — ABNORMAL LOW (ref 13.0–17.0)
O2 Saturation: 82 %
Potassium: 3.4 mmol/L — ABNORMAL LOW (ref 3.5–5.1)
Sodium: 140 mmol/L (ref 135–145)
TCO2: 39 mmol/L — ABNORMAL HIGH (ref 22–32)
pCO2, Ven: 51.7 mmHg (ref 44–60)
pH, Ven: 7.465 — ABNORMAL HIGH (ref 7.25–7.43)
pO2, Ven: 44 mmHg (ref 32–45)

## 2022-05-21 LAB — PROCALCITONIN: Procalcitonin: 0.12 ng/mL

## 2022-05-21 LAB — CREATININE, SERUM
Creatinine, Ser: 0.89 mg/dL (ref 0.61–1.24)
GFR, Estimated: >60 mL/min (ref >60)

## 2022-05-21 LAB — MAGNESIUM
Magnesium: 1.8 mg/dL (ref 1.7–2.4)
Magnesium: 1.8 mg/dL (ref 1.7–2.4)

## 2022-05-21 LAB — TROPONIN I (HIGH SENSITIVITY): Troponin I (High Sensitivity): 156 ng/L (ref <18)

## 2022-05-21 MED ORDER — SODIUM CHLORIDE 0.9 % IV SOLN: INTRAVENOUS | Status: DC | PRN

## 2022-05-21 MED ORDER — ENOXAPARIN SODIUM 40 MG/0.4ML IJ SOSY
40.0000 mg | PREFILLED_SYRINGE | INTRAMUSCULAR | Status: DC
  Administered 2022-05-21 – 2022-05-22 (×2): 40 mg via SUBCUTANEOUS
  Filled 2022-05-21 (×2): qty 0.4

## 2022-05-21 MED ORDER — DORZOLAMIDE HCL 2 % OP SOLN
1.0000 [drp] | Freq: Two times a day (BID) | OPHTHALMIC | Status: DC
  Administered 2022-05-21 – 2022-05-27 (×12): 1 [drp] via OPHTHALMIC
  Filled 2022-05-21: qty 10

## 2022-05-21 MED ORDER — SODIUM CHLORIDE 0.9% FLUSH: 3.0000 mL | INTRAVENOUS | Status: DC | PRN

## 2022-05-21 MED ORDER — ASPIRIN 81 MG PO TBEC
81.0000 mg | DELAYED_RELEASE_TABLET | Freq: Every day | ORAL | Status: DC
  Administered 2022-05-21 – 2022-05-27 (×7): 81 mg via ORAL
  Filled 2022-05-21 (×7): qty 1

## 2022-05-21 MED ORDER — SODIUM CHLORIDE 0.9 % IV SOLN
2.0000 g | Freq: Once | INTRAVENOUS | Status: AC
  Administered 2022-05-21: 2 g via INTRAVENOUS
  Filled 2022-05-21: qty 12.5

## 2022-05-21 MED ORDER — POTASSIUM CHLORIDE CRYS ER 20 MEQ PO TBCR
40.0000 meq | EXTENDED_RELEASE_TABLET | Freq: Once | ORAL | Status: AC
  Administered 2022-05-21: 40 meq via ORAL
  Filled 2022-05-21: qty 2

## 2022-05-21 MED ORDER — IOHEXOL 350 MG/ML SOLN
75.0000 mL | Freq: Once | INTRAVENOUS | Status: AC | PRN
  Administered 2022-05-21: 75 mL via INTRAVENOUS

## 2022-05-21 MED ORDER — LISINOPRIL 10 MG PO TABS
5.0000 mg | ORAL_TABLET | Freq: Every day | ORAL | Status: DC
  Filled 2022-05-21: qty 1

## 2022-05-21 MED ORDER — IPRATROPIUM-ALBUTEROL 0.5-2.5 (3) MG/3ML IN SOLN
3.0000 mL | RESPIRATORY_TRACT | Status: DC | PRN
  Administered 2022-05-23 – 2022-05-24 (×2): 3 mL via RESPIRATORY_TRACT
  Filled 2022-05-21 (×3): qty 3

## 2022-05-21 MED ORDER — ONDANSETRON HCL 4 MG/2ML IJ SOLN: 4.0000 mg | Freq: Four times a day (QID) | INTRAMUSCULAR | Status: DC | PRN

## 2022-05-21 MED ORDER — ACETAMINOPHEN 325 MG PO TABS
650.0000 mg | ORAL_TABLET | ORAL | Status: DC | PRN
  Administered 2022-05-21: 650 mg via ORAL

## 2022-05-21 MED ORDER — CHLORHEXIDINE GLUCONATE CLOTH 2 % EX PADS
6.0000 | MEDICATED_PAD | Freq: Every day | CUTANEOUS | Status: DC
  Administered 2022-05-22 – 2022-05-27 (×6): 6 via TOPICAL

## 2022-05-21 MED ORDER — TIMOLOL MALEATE 0.5 % OP SOLN
1.0000 [drp] | Freq: Two times a day (BID) | OPHTHALMIC | Status: DC
  Administered 2022-05-21 – 2022-05-27 (×12): 1 [drp] via OPHTHALMIC
  Filled 2022-05-21: qty 5

## 2022-05-21 MED ORDER — IPRATROPIUM-ALBUTEROL 0.5-2.5 (3) MG/3ML IN SOLN
3.0000 mL | Freq: Once | RESPIRATORY_TRACT | Status: AC
  Administered 2022-05-21: 3 mL via RESPIRATORY_TRACT
  Filled 2022-05-21: qty 3

## 2022-05-21 MED ORDER — MOMETASONE FURO-FORMOTEROL FUM 100-5 MCG/ACT IN AERO
2.0000 | INHALATION_SPRAY | Freq: Two times a day (BID) | RESPIRATORY_TRACT | Status: DC
  Administered 2022-05-21 – 2022-05-23 (×4): 2 via RESPIRATORY_TRACT
  Filled 2022-05-21: qty 8.8

## 2022-05-21 MED ORDER — GABAPENTIN 300 MG PO CAPS
300.0000 mg | ORAL_CAPSULE | Freq: Two times a day (BID) | ORAL | Status: DC
  Administered 2022-05-21 – 2022-05-27 (×13): 300 mg via ORAL
  Filled 2022-05-21 (×13): qty 1

## 2022-05-21 MED ORDER — SODIUM CHLORIDE 0.9 % IV SOLN
250.0000 mL | INTRAVENOUS | Status: DC | PRN
  Administered 2022-05-21: 250 mL via INTRAVENOUS

## 2022-05-21 MED ORDER — METHYLPREDNISOLONE SODIUM SUCC 125 MG IJ SOLR
80.0000 mg | INTRAMUSCULAR | Status: DC
  Administered 2022-05-21 – 2022-05-22 (×2): 80 mg via INTRAVENOUS
  Filled 2022-05-21 (×2): qty 2

## 2022-05-21 MED ORDER — FUROSEMIDE 10 MG/ML IJ SOLN
40.0000 mg | Freq: Two times a day (BID) | INTRAMUSCULAR | Status: DC
  Administered 2022-05-21 – 2022-05-22 (×3): 40 mg via INTRAVENOUS
  Filled 2022-05-21 (×3): qty 4

## 2022-05-21 MED ORDER — POTASSIUM CHLORIDE CRYS ER 20 MEQ PO TBCR
40.0000 meq | EXTENDED_RELEASE_TABLET | ORAL | Status: DC
  Administered 2022-05-21: 40 meq via ORAL
  Filled 2022-05-21: qty 2

## 2022-05-21 MED ORDER — QUETIAPINE FUMARATE 50 MG PO TABS
300.0000 mg | ORAL_TABLET | Freq: Every day | ORAL | Status: DC
  Administered 2022-05-21 – 2022-05-23 (×2): 300 mg via ORAL
  Filled 2022-05-21 (×2): qty 6

## 2022-05-21 MED ORDER — VANCOMYCIN HCL 2000 MG/400ML IV SOLN
2000.0000 mg | Freq: Once | INTRAVENOUS | Status: AC
  Administered 2022-05-21: 2000 mg via INTRAVENOUS
  Filled 2022-05-21 (×2): qty 400

## 2022-05-21 MED ORDER — ACETAMINOPHEN 325 MG PO TABS
650.0000 mg | ORAL_TABLET | ORAL | Status: DC | PRN
  Filled 2022-05-21: qty 2

## 2022-05-21 MED ORDER — LATANOPROST 0.005 % OP SOLN
1.0000 [drp] | Freq: Two times a day (BID) | OPHTHALMIC | Status: DC
  Administered 2022-05-21 – 2022-05-27 (×12): 1 [drp] via OPHTHALMIC
  Filled 2022-05-21: qty 2.5

## 2022-05-21 MED ORDER — VANCOMYCIN HCL IN DEXTROSE 1-5 GM/200ML-% IV SOLN
1000.0000 mg | Freq: Two times a day (BID) | INTRAVENOUS | Status: DC
  Administered 2022-05-21 – 2022-05-22 (×2): 1000 mg via INTRAVENOUS
  Filled 2022-05-21 (×2): qty 200

## 2022-05-21 MED ORDER — MAGNESIUM SULFATE IN D5W 1-5 GM/100ML-% IV SOLN
1.0000 g | Freq: Once | INTRAVENOUS | Status: AC
  Administered 2022-05-21: 1 g via INTRAVENOUS
  Filled 2022-05-21: qty 100

## 2022-05-21 MED ORDER — DIVALPROEX SODIUM 500 MG PO DR TAB
1000.0000 mg | DELAYED_RELEASE_TABLET | Freq: Every day | ORAL | Status: DC
  Administered 2022-05-21 – 2022-05-26 (×6): 1000 mg via ORAL
  Filled 2022-05-21 (×7): qty 2

## 2022-05-21 MED ORDER — ATORVASTATIN CALCIUM 40 MG PO TABS
40.0000 mg | ORAL_TABLET | Freq: Every day | ORAL | Status: DC
  Administered 2022-05-21 – 2022-05-27 (×7): 40 mg via ORAL
  Filled 2022-05-21 (×7): qty 1

## 2022-05-21 MED ORDER — SODIUM CHLORIDE 0.9% FLUSH
3.0000 mL | Freq: Two times a day (BID) | INTRAVENOUS | Status: DC
  Administered 2022-05-21 – 2022-05-27 (×11): 3 mL via INTRAVENOUS

## 2022-05-21 MED ORDER — SODIUM CHLORIDE 0.9 % IV SOLN
2.0000 g | Freq: Three times a day (TID) | INTRAVENOUS | Status: DC
  Administered 2022-05-21 – 2022-05-22 (×4): 2 g via INTRAVENOUS
  Filled 2022-05-21 (×5): qty 12.5

## 2022-05-21 MED ORDER — MELATONIN 5 MG PO TABS
10.0000 mg | ORAL_TABLET | Freq: Every day | ORAL | Status: DC
  Administered 2022-05-21 – 2022-05-26 (×6): 10 mg via ORAL
  Filled 2022-05-21 (×6): qty 2

## 2022-05-21 MED ORDER — PANTOPRAZOLE SODIUM 40 MG PO TBEC
40.0000 mg | DELAYED_RELEASE_TABLET | Freq: Two times a day (BID) | ORAL | Status: DC
  Administered 2022-05-21 – 2022-05-27 (×13): 40 mg via ORAL
  Filled 2022-05-21 (×13): qty 1

## 2022-05-21 MED ORDER — TAMSULOSIN HCL 0.4 MG PO CAPS
0.4000 mg | ORAL_CAPSULE | Freq: Every day | ORAL | Status: DC
  Administered 2022-05-21 – 2022-05-26 (×6): 0.4 mg via ORAL
  Filled 2022-05-21 (×6): qty 1

## 2022-05-21 NOTE — Progress Notes (Signed)
Pharmacy Antibiotic Note  Douglas Perkins. is a 77 y.o. male admitted on 05/20/2022 with persistent pneumonia.  Pharmacy has been consulted for vancomycin and cefepime dosing.  Plan: Vancomycin '2000mg'$  x1 then '1000mg'$  IV Q12H. Goal AUC 400-550.  Expected AUC 450.  SCr used 0.8 (actual 0.68). Cefepime 2g IV Q8H.  Height: 6' (182.9 cm) Weight: 119.4 kg (263 lb 3.7 oz) IBW/kg (Calculated) : 77.6  Temp (24hrs), Avg:97.3 F (36.3 C), Min:97.3 F (36.3 C), Max:97.3 F (36.3 C)  Recent Labs  Lab 05/15/22 0442 05/16/22 0514 05/17/22 0407 05/18/22 0437 05/20/22 2240  WBC 9.8 9.7 10.4 9.7 13.4*  CREATININE 0.85 0.71 0.79 0.90 0.68    Estimated Creatinine Clearance: 101.5 mL/min (by C-G formula based on SCr of 0.68 mg/dL).    Allergies  Allergen Reactions   Sulfa Antibiotics Hives   Feldene [Piroxicam] Hives    Thank you for allowing pharmacy to be a part of this patient's care.  Wynona Neat, PharmD, BCPS  05/21/2022 5:42 AM

## 2022-05-21 NOTE — Progress Notes (Signed)
PROGRESS NOTE  Douglas Perkins. 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  Douglas Perkins was hypoxic with significant work of breathing while in ED and started on BiPAP.  BNP elevated to 1662.  Troponin 81.  WBC 13.4.  VBG suggestive of metabolic alkalosis.  CXR with cardiomegaly, pulmonary vascular congestion and increased airspace opacities in the lungs bilaterally with improvement in RUL.  D-dimer slightly elevated to 1.38.  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  Blood cultures obtained.  Douglas Perkins was started on IV Lasix, BiPAP and broad-spectrum antibiotics and admitted.  Subjective: Seen and examined earlier this morning.  No major events this morning.  Douglas Perkins is on BiPAP.  He is hard of hearing.  He reports cough but denies chest pain or shortness of breath.  Denies GI or UTI symptoms.  Not a great historian.  Objective: Vitals:   05/21/22 0552 05/21/22 0755 05/21/22 0800 05/21/22 1018  BP:  (!) 117/51 (!) 105/53   Pulse:  63 (!) 55   Resp:  18 (!) 27   Temp: 98 F (36.7 C)   98.9 F (37.2 C)  TempSrc:  Axillary   Axillary  SpO2:  98% 98%   Weight:      Height:        Examination:  GENERAL: No apparent distress.  Nontoxic. HEENT: MMM.  Vision and hearing grossly intact.  NECK: Supple.  Given acidosis JVD while on BiPAP with body habitus. RESP:  No IWOB.  Fair aeration bilaterally.  Rhonchi while on BiPAP. CVS:  RRR. Heart sounds normal.  ABD/GI/GU: BS+. Abd soft, NTND.  MSK/EXT:  Moves extremities.  He has RLL atrophy.  SKIN: no apparent skin lesion or wound NEURO: Awake, alert and oriented appropriately.  Follows commands.  No apparent focal neuro deficit. PSYCH: Calm. Normal affect.   Procedures:  None  Microbiology summarized: [id number], influenza and RSV PCR nonreactive. Blood cultures NGTD  Assessment and plan: Principal Problem:   Acute on chronic respiratory failure with hypoxemia (HCC) Active Problems:   Benign essential hypertension   Hyperlipidemia, mixed   Bilateral pneumonia   Bipolar I disorder (HCC)   Coronary artery disease involving native coronary artery of native heart   Obesity (BMI 30-39.9)   Cardiac pacemaker   History of Merkel cell carcinoma   History of sinoatrial node dysfunction   OSA (obstructive sleep apnea)   Acute on chronic diastolic CHF (congestive heart failure) (HCC)   Physical deconditioning   Morbid obesity ([name])  Acute on chronic respiratory failure with hypoxemia: due to the HF and possible multifocal pneumonia. BNP elevated to 1660 (higher than baseline).  CXR suggested CHF.  CT chest without contrast suggests bilateral pneumonia.  Hospital from 1/11-1/18 for CHF and multifocal pneumonia.  He was diuresed with IV Lasix 60 mg twice daily and discharged on p.o. Lasix 20 mg daily and 4 L oxygen. -Continue IV Lasix 40 mg twice daily. -Strict intake and output, daily weight, renal functions and electrolytes -Wean off BiPAP as able.  Minimum oxygen to keep saturation above 88% -PT/OT  Acute on chronic diastolic CHF: Limited TTE on  1/12 with LVEF of 55 to 60%, indeterminate DD -Management as above.  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  Blood cultures NGTD. -Continue broad-spectrum antibiotics for now -Check procalcitonin  OSA: Has implantable inspire device.  Now on BiPAP.  Physical deconditioning -PT/OT  Bipolar 1 disorder: Stable. -Continue home Depakote and Seroquel  Elevated troponin/history of CAD: Likely demand ischemia. -Management as above.  Elevated D-dimer: D-dimer 1.38.  Low suspicion for VTE but has to rule out DVT and PE. -Check lower extremity venous Doppler -CTA chest to rule out PE  Essential hypertension: Soft blood pressures. -Hold home Coreg and lisinopril.  BPH without LUTS -Monitor urine output -Continue on Flomax  Morbid obesity Body mass index is 35.7 kg/m.      DVT prophylaxis:  enoxaparin (LOVENOX) injection 40 mg Start: 05/21/22 0800 SCDs Start: 05/21/22 0426  Code Status: Full code Family Communication: None at bedside Level of care: Progressive Status is: Inpatient Remains inpatient appropriate because: Due to acute on chronic respiratory failure with hypoxemia, acute CHF and possible multifocal pneumonia   Final disposition: SNF Consultants:  None  55 minutes with more than 50% spent in reviewing records, counseling Douglas Perkins/family and coordinating care.   Sch Meds:  Scheduled Meds:  aspirin EC  81 mg Oral Daily   atorvastatin  40 mg Oral Daily   divalproex  1,000 mg Oral QHS   dorzolamide  1 drop Right Eye BID   enoxaparin (LOVENOX) injection  40 mg Subcutaneous Q24H   furosemide  40 mg Intravenous Q12H   gabapentin  300 mg Oral BID   ipratropium-albuterol  3 mL Nebulization Once   latanoprost  1 drop Right Eye BID   Melatonin  10 mg Oral QHS   methylPREDNISolone (SOLU-MEDROL) injection  80 mg Intravenous Q24H    mometasone-formoterol  2 puff Inhalation BID   pantoprazole  40 mg Oral BID   QUEtiapine  300 mg Oral QHS   sodium chloride flush  3 mL Intravenous Q12H   tamsulosin  0.4 mg Oral QHS   timolol  1 drop Right Eye BID   Continuous Infusions:  sodium chloride     ceFEPime (MAXIPIME) IV 2 g (05/21/22 1026)   vancomycin     PRN Meds:.sodium chloride, acetaminophen, acetaminophen, ondansetron (ZOFRAN) IV, ondansetron (ZOFRAN) IV, sodium chloride flush  Antimicrobials: Anti-infectives (From admission, onward)    Start     Dose/Rate Route Frequency Ordered Stop   05/21/22 1800  vancomycin (VANCOCIN) IVPB 1000 mg/200 mL premix        1,000 mg 200 mL/hr over 60 Minutes Intravenous Every 12 hours 05/21/22 0546     05/21/22 0800  ceFEPIme (MAXIPIME) 2 g in sodium chloride 0.9 % 100 mL IVPB        2 g 200 mL/hr over 30 Minutes Intravenous Every 8 hours 05/21/22 0546     05/21/22 0130  ceFEPIme (MAXIPIME) 2 g in sodium chloride 0.9 % 100 mL IVPB        2  g 200 mL/hr over 30 Minutes Intravenous  Once 05/21/22 0117 05/21/22 0346   05/21/22 0130  vancomycin (VANCOREADY) IVPB 2000 mg/400 mL        2,000 mg 200 mL/hr over 120 Minutes Intravenous  Once 05/21/22 0128 05/21/22 0814        I have personally reviewed the following labs and images: CBC: Recent Labs  Lab 05/16/22 0514 05/17/22 0407 05/18/22 0437 05/20/22 2240 05/21/22 0010 05/21/22 0408 05/21/22 0543  WBC 9.7 10.4 9.7 13.4*  --   --  11.6*  NEUTROABS 6.3 6.5 5.9 11.1*  --   --  8.1*  HGB 9.1* 9.4* 9.5* 10.8* 10.9* 9.9* 9.0*  HCT 29.1* 28.9* 29.0* 32.4* 32.0* 29.0* 28.6*  MCV 98.0 94.4 95.7 93.1  --   --  96.3  PLT 287 302 303 341  --   --  348   BMP &GFR Recent Labs  Lab 05/15/22 0442 05/16/22 0514 05/17/22 0407 05/18/22 0437 05/20/22 2240 05/21/22 0010 05/21/22 0408 05/21/22 0543 05/21/22 0830  NA 137 140 141 140 138 140 139  --  140  K 3.6 4.1 3.8 3.5 3.2* 3.4* 3.4*  --  3.2*  CL 102 101 97* 95* 94*  --   --    --  97*  CO2 28 31 34* 39* 32  --   --   --  37*  GLUCOSE 128* 117* 117* 106* 168*  --   --   --  107*  BUN '19 15 15 16 9  '$ --   --   --  12  CREATININE 0.85 0.71 0.79 0.90 0.68  --   --  0.89 0.79  CALCIUM 8.3* 8.6* 8.8* 8.5* 8.5*  --   --   --  8.3*  MG 1.9 1.8 1.6*  --   --   --   --  1.8 1.8  PHOS  --   --   --   --   --   --   --   --  2.9   Estimated Creatinine Clearance: 101.5 mL/min (by C-G formula based on SCr of 0.79 mg/dL). Liver & Pancreas: Recent Labs  Lab 05/21/22 0830  ALBUMIN 2.1*   No results for input(s): "LIPASE", "AMYLASE" in the last 168 hours. No results for input(s): "AMMONIA" in the last 168 hours. Diabetic: No results for input(s): "HGBA1C" in the last 72 hours. No results for input(s): "GLUCAP" in the last 168 hours. Cardiac Enzymes: No results for input(s): "CKTOTAL", "CKMB", "CKMBINDEX", "TROPONINI" in the last 168 hours. No results for input(s): "PROBNP" in the last 8760 hours. Coagulation Profile: No results for input(s): "INR", "PROTIME" in the last 168 hours. Thyroid Function Tests: No results for input(s): "TSH", "T4TOTAL", "FREET4", "T3FREE", "THYROIDAB" in the last 72 hours. Lipid Profile: No results for input(s): "CHOL", "HDL", "LDLCALC", "TRIG", "CHOLHDL", "LDLDIRECT" in the last 72 hours. Anemia Panel: No results for input(s): "VITAMINB12", "FOLATE", "FERRITIN", "TIBC", "IRON", "RETICCTPCT" in the last 72 hours. Urine analysis:    Component Value Date/Time   COLORURINE YELLOW 04/09/2022 2135   APPEARANCEUR HAZY (A) 04/09/2022 2135   APPEARANCEUR Clear 05/24/2015 1328   LABSPEC 1.017 04/09/2022 2135   PHURINE 6.0 04/09/2022 2135   GLUCOSEU NEGATIVE 04/09/2022 2135   HGBUR SMALL (A) 04/09/2022 2135   BILIRUBINUR NEGATIVE 04/09/2022 2135   BILIRUBINUR Negative 05/24/2015 [place] 04/09/2022 2135   PROTEINUR 30 (A) 04/09/2022 2135   NITRITE NEGATIVE 04/09/2022 2135   LEUKOCYTESUR NEGATIVE 04/09/2022 2135  Sepsis  Labs: Invalid input(s): "PROCALCITONIN", "LACTICIDVEN"  Microbiology: Recent Results (from the past 240 hour(s))  MRSA Next Gen by PCR, Nasal     Status: None   Collection Time: 05/13/22 11:37 AM   Specimen: Nasal Mucosa; Nasal Swab  Result Value Ref Range Status   MRSA by PCR Next Gen NOT DETECTED NOT DETECTED Final    Comment: (NOTE) The GeneXpert MRSA Assay (FDA approved for NASAL specimens only), is one component of a comprehensive MRSA colonization surveillance program. It is not intended to diagnose MRSA infection nor to guide or monitor treatment for MRSA infections. Test performance is not FDA approved in patients less than 95 years old. Performed at [facility], [place] [address], [place], [place] [address]   Resp panel by RT-PCR (RSV, Flu A&B, Covid) Anterior Nasal Swab     Status: None   Collection Time: 05/20/22 10:08 PM   Specimen: Anterior Nasal Swab  Result Value Ref Range Status   SARS Coronavirus 2 by RT PCR NEGATIVE NEGATIVE Final    Comment: (NOTE) SARS-CoV-2 target nucleic acids are NOT DETECTED.  The SARS-CoV-2 RNA is generally detectable in upper respiratory specimens during the acute phase of infection. The lowest concentration of SARS-CoV-2 viral copies this assay can detect is 138 copies/mL. A negative result does not preclude SARS-Cov-2 infection and should not be used as the sole basis for treatment or other Douglas Perkins management decisions. A negative result may occur with  improper specimen collection/handling, submission of specimen other than nasopharyngeal swab, presence of viral mutation(s) within the areas targeted by this assay, and inadequate number of viral copies(<138 copies/mL). A negative result must be combined with clinical observations, Douglas Perkins history, and epidemiological information. The expected result is Negative.  Fact Sheet for Patients:  EntrepreneurPulse.com.au  Fact Sheet for Healthcare Providers:   IncredibleEmployment.be  This test is no t yet approved or cleared by the [place] FDA and  has been authorized for detection and/or diagnosis of SARS-CoV-2 by FDA under an Emergency Use Authorization (EUA). This EUA will remain  in effect (meaning this test can be used) for the duration of the COVID-19 declaration under Section 564(b)(1) of the Act, 21 U.S.C.section 360bbb-3(b)(1), unless the authorization is terminated  or revoked sooner.       Influenza A by PCR NEGATIVE NEGATIVE Final   Influenza B by PCR NEGATIVE NEGATIVE Final    Comment: (NOTE) The Xpert Xpress SARS-CoV-2/FLU/RSV plus assay is intended as an aid in the diagnosis of influenza from Nasopharyngeal swab specimens and should not be used as a sole basis for treatment. Nasal washings and aspirates are unacceptable for Xpert Xpress SARS-CoV-2/FLU/RSV testing.  Fact Sheet for Patients: EntrepreneurPulse.com.au  Fact Sheet for Healthcare Providers: IncredibleEmployment.be  This test is not yet approved or cleared by the [place] FDA and has been authorized for detection and/or diagnosis of SARS-CoV-2 by FDA under an Emergency Use Authorization (EUA). This EUA will remain in effect (meaning this test can be used) for the duration of the COVID-19 declaration under Section 564(b)(1) of the Act, 21 U.S.C. section 360bbb-3(b)(1), unless the authorization is terminated or revoked.     Resp Syncytial Virus by PCR NEGATIVE NEGATIVE Final    Comment: (NOTE) Fact Sheet for Patients: EntrepreneurPulse.com.au  Fact Sheet for Healthcare Providers: IncredibleEmployment.be  This test is not yet approved or cleared by the [place] FDA and has been authorized for detection and/or diagnosis of SARS-CoV-2 by FDA under an Emergency Use Authorization (EUA). This EUA  will remain in effect (meaning this test can be used) for  the duration of the COVID-19 declaration under Section 564(b)(1) of the Act, 21 U.S.C. section 360bbb-3(b)(1), unless the authorization is terminated or revoked.  Performed at [facility], [place] [address], [place], [place] [address]   Culture, blood (Routine X 2) w Reflex to ID Panel     Status: None (Preliminary result)   Collection Time: 05/21/22  5:43 AM   Specimen: BLOOD RIGHT HAND  Result Value Ref Range Status   Specimen Description BLOOD RIGHT HAND  Final   Special Requests   Final    BOTTLES DRAWN AEROBIC AND ANAEROBIC Blood Culture adequate volume   Culture   Final    NO GROWTH < 12 HOURS Performed at [facility], [place] [address], [place], [place] [address]    Report Status PENDING  Incomplete  Culture, blood (Routine X 2) w Reflex to ID Panel     Status: None (Preliminary result)   Collection Time: 05/21/22  5:44 AM   Specimen: BLOOD LEFT FOREARM  Result Value Ref Range Status   Specimen Description BLOOD LEFT FOREARM  Final   Special Requests   Final    BOTTLES DRAWN AEROBIC AND ANAEROBIC Blood Culture adequate volume   Culture   Final    NO GROWTH < 12 HOURS Performed at [facility], [place] [address] [place], [place] [address]    Report Status PENDING  Incomplete    Radiology Studies: CT CHEST WO CONTRAST  Result Date: 05/21/2022 CLINICAL DATA:  Dyspnea. Central airway disease suspected with acute on chronic respiratory failure. EXAM: CT CHEST WITHOUT CONTRAST TECHNIQUE: Multidetector CT imaging of the chest was performed following the standard protocol without IV contrast. RADIATION DOSE REDUCTION: This exam was performed according to the departmental dose-optimization program which includes automated exposure control, adjustment of the mA and/or kV according to Douglas Perkins size and/or use of iterative reconstruction technique. COMPARISON:  CT angio chest 04/09/2022 FINDINGS: Cardiovascular: Heart size is mildly enlarged. Aortic atherosclerosis and 3  vessel coronary artery calcification. No pericardial effusion. Abandon pacer lead identified within the right ventricle. Mediastinum/Nodes: Thyroid gland appears normal. There is scratch set the proximal trachea appears normal in caliber. The distal trachea and right and left mainstem bronchus demonstrate significantly diminished AP diameter. Distal trachea for example measures 5 mm in AP dimension on the current exam, image 40/3. This appears similar to the previous exam. Esophagus demonstrates no significant findings. No enlarged axillary or mediastinal lymph nodes. The hilar lymph nodes are suboptimally evaluated due to lack of IV contrast material. Lungs/Pleura: Small to moderate bilateral pleural effusions are increased in volume from the previous exam. Bilateral, upper and lower lobe areas of ground-glass and airspace consolidation are identified. New when compared with the prior examination. Upper Abdomen: No acute abnormality. Cholecystectomy. Cyst within segment 2 of the liver measures 1.5 cm. Musculoskeletal: Spondylosis identified within the thoracic spine. Remote right posterolateral tenth rib and left ninth rib fractures. IMPRESSION: 1. Interval increase in volume of bilateral pleural effusions. 2. Bilateral, upper and lower lobe areas of ground-glass and airspace consolidation are identified. Findings compatible with multifocal pneumonia and or ARDS. 3. Significant diminished AP diameter of the distal trachea and right and left mainstem bronchus. 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 4. Coronary artery calcifications. 5.  Aortic Atherosclerosis (ICD10-I70.0). Electronically Signed   By: Kerby Moors M.D.   On: 05/21/2022 07:01  DG Chest Port 1 View  Result Date: 05/21/2022 CLINICAL DATA:  Shortness of breath. EXAM: PORTABLE CHEST 1 VIEW COMPARISON:  05/17/2022. FINDINGS: The heart is enlarged and the mediastinal contour stable. There is  atherosclerotic calcification of the aorta. The pulmonary vasculature is distended. Airspace opacities are noted in the right lung, improved in the right upper lobe and increased at the lung bases bilaterally. There are peripheral airspace opacities in the mid left lung. No effusion or pneumothorax. A left PICC line is stable in position. A neurostimulator device is noted on the right with leads coursing over the cervical region on the right. No acute osseous abnormality. IMPRESSION: 1. Cardiomegaly with pulmonary vascular congestion. 2. Increased airspace opacities in the lungs bilaterally with improvement in the right upper lobe. Electronically Signed   By: Brett Fairy M.D.   On: 05/21/2022 01:09      Crissa Sowder T. Siletz  If 7PM-7AM, please contact night-coverage [email or web address] 05/21/2022, 10:34 AM

## 2022-05-21 NOTE — ED Notes (Signed)
ED TO INPATIENT HANDOFF REPORT  ED Nurse Name and Phone #: [phone]  S Name/Age/Gender Douglas Perkins. 77 y.o. male Room/Bed: 013C/013C  Code Status   Code Status: Full Code  Home/SNF/Other Skilled nursing facility Patient oriented to: self and place Is this baseline? Yes   Triage Complete: Triage complete  Chief Complaint Acute respiratory failure with hypoxia ([place]) [J96.01] Acute on chronic respiratory failure with hypoxemia ([place]) [J96.21]  Triage Note Per EMS, pt from [facility], Wife made him come b/c "he was breathing funny."  EMS found him in the low 80s on home O12 of 4L.  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  156/96 98% on neb CBG 272    Allergies Allergies  Allergen Reactions   Sulfa Antibiotics Hives   Feldene [Piroxicam] Hives    Level of Care/Admitting Diagnosis ED Disposition     ED Disposition  Admit   Condition  --   Comment  Hospital Area: [facility] [[id number]]  Level of Care: Progressive [102]  Admit to Progressive based on following criteria: CARDIOVASCULAR & THORACIC of moderate stability with acute coronary syndrome symptoms/low risk myocardial infarction/hypertensive urgency/arrhythmias/heart failure potentially compromising stability and stable post cardiovascular intervention patients.  May admit patient to [facility] or [facility] if equivalent level of care is available:: Yes  Covid Evaluation: Confirmed COVID Negative  Diagnosis: Acute on chronic respiratory failure with hypoxemia [facility]) [[id number]]  Admitting Physician: [place], [place]  Attending Physician: Quintella Baton [[id number]]  Certification:: I certify this patient will need inpatient services for at least 2 midnights          B Medical/Surgery History Past Medical History:  Diagnosis Date   Bipolar 1 disorder ([place])     Cancer ([place])    prostate   Glaucoma    Hypertension    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[place])    Mural thrombus of cardiac apex    Presence of permanent cardiac pacemaker 2017   SA node dysfunction   Sleep apnea    does not use CPAP   TIA (transient ischemic attack)    Past Surgical History:  Procedure Laterality Date   BUBBLE STUDY  04/14/2022   Procedure: BUBBLE STUDY;  Surgeon: Lelon Perla, MD;  Location: [place];  Service: Cardiovascular;;   CHOLECYSTECTOMY     COLONOSCOPY WITH PROPOFOL N/A 01/31/2017   Procedure: COLONOSCOPY WITH PROPOFOL;  Surgeon: Manya Silvas, MD;  Location: [facility] ENDOSCOPY;  Service: Endoscopy;  Laterality: N/A;   DRUG INDUCED ENDOSCOPY N/A 05/12/2020   Procedure: DRUG INDUCED ENDOSCOPY;  Surgeon: Melida Quitter, MD;  Location: [place] [place] [place];  Service: ENT;  Laterality: N/A;   IMPLANTATION OF HYPOGLOSSAL NERVE STIMULATOR N/A 07/21/2020   Procedure: IMPLANTATION OF HYPOGLOSSAL NERVE STIMULATOR;  Surgeon: Melida Quitter, MD;  Location: [place];  Service: ENT;  Laterality: N/A;   LEAD EXTRACTION N/A 04/17/2022   Procedure: LEAD EXTRACTION;  Surgeon: Vickie Epley, MD;  Location: [place] CV LAB;  Service: Cardiovascular;  Laterality: N/A;   LEG SURGERY Left    distal   Pace maker placement     PROSTATE SURGERY     PROSTATECTOMY     SKIN CANCER EXCISION  2006,2007   Merkle Cell Carcinoma   TEE WITHOUT CARDIOVERSION N/A 04/14/2022   Procedure: TRANSESOPHAGEAL ECHOCARDIOGRAM (TEE);  Surgeon: Lelon Perla, MD;  Location: [facility] ENDOSCOPY;  Service: Cardiovascular;  Laterality: N/A;     A IV Location/Drains/Wounds Patient Lines/Drains/Airways Status     Active Line/Drains/Airways     Name Placement date Placement time Site Days   Peripheral IV 05/20/22 20 G Right;Anterior Forearm 05/20/22  2346  Forearm  1   PICC Single Lumen 19/14/78 Left Basilic 47 cm 0 cm 29/56/21  3086  Basilic  4   Pressure Injury 02/18/21  Buttocks Right Stage 1 -  Intact skin with non-blanchable redness of a localized area usually over a bony prominence. 1x1 nonblanchable 02/18/21  1845  -- 457   Wound / Incision (Open or Dehisced) 02/18/21 Other (Comment) Abdomen Left 02/18/21  1840  Abdomen  457   Wound / Incision (Open or Dehisced) 04/17/22 Irritant Dermatitis (Moisture Associated Skin Damage) Groin Anterior;Proximal;Right;Left 04/17/22  2132  Groin  34            Intake/Output Last 24 hours No intake or output data in the 24 hours ending 05/21/22 1434  Labs/Imaging Results for orders placed or performed during the hospital encounter of 05/20/22 (from the past 48 hour(s))  Resp panel by RT-PCR (RSV, Flu A&B, Covid) Anterior Nasal Swab     Status: None   Collection Time: 05/20/22 10:08 PM   Specimen: Anterior Nasal Swab  Result Value Ref Range   SARS Coronavirus 2 by RT PCR NEGATIVE NEGATIVE    Comment: (NOTE) SARS-CoV-2 target nucleic acids are NOT DETECTED.  The SARS-CoV-2 RNA is generally detectable in upper respiratory specimens during the acute phase of infection. The lowest concentration of SARS-CoV-2 viral copies this assay can detect is 138 copies/mL. A negative result does not preclude SARS-Cov-2 infection and should not be used as the sole basis for treatment or other patient management decisions. A negative result may occur with  improper specimen collection/handling, submission of specimen other than nasopharyngeal swab, presence of viral mutation(s) within the areas targeted by this assay, and inadequate number of viral copies(<138 copies/mL). A negative result must be combined with clinical observations, patient history, and epidemiological information. The expected result is Negative.  Fact Sheet for Patients:  EntrepreneurPulse.com.au  Fact Sheet for Healthcare Providers:  IncredibleEmployment.be  This test is no t yet approved or cleared by the [place] FDA and  has been authorized for detection and/or diagnosis of SARS-CoV-2 by FDA under an Emergency Use Authorization (EUA). This EUA will remain  in effect (meaning this test can be used) for the duration of the COVID-19 declaration under Section 564(b)(1) of the Act, 21 U.S.C.section 360bbb-3(b)(1), unless the authorization is terminated  or revoked sooner.       Influenza A by PCR NEGATIVE NEGATIVE   Influenza B by PCR NEGATIVE NEGATIVE    Comment: (NOTE) The Xpert Xpress SARS-CoV-2/FLU/RSV plus assay is intended as an aid in the diagnosis of influenza from Nasopharyngeal swab specimens and should not be used as a sole basis for treatment. Nasal washings and aspirates are unacceptable for Xpert Xpress SARS-CoV-2/FLU/RSV testing.  Fact Sheet for Patients: EntrepreneurPulse.com.au  Fact Sheet for Healthcare Providers: IncredibleEmployment.be  This test is not yet approved or cleared by the [place] FDA and has been authorized for detection and/or diagnosis of SARS-CoV-2 by FDA under an Emergency Use Authorization (EUA). This EUA will remain in effect (meaning this test can be used) for the duration of the COVID-19 declaration under Section 564(b)(1) of the Act, 21 U.S.C. section 360bbb-3(b)(1), unless the authorization  is terminated or revoked.     Resp Syncytial Virus by PCR NEGATIVE NEGATIVE    Comment: (NOTE) Fact Sheet for Patients: EntrepreneurPulse.com.au  Fact Sheet for Healthcare Providers: IncredibleEmployment.be  This test is not yet approved or cleared by the [place] FDA and has been authorized for detection and/or diagnosis of SARS-CoV-2 by FDA under an Emergency Use Authorization (EUA). This EUA will remain in effect (meaning this test can be used) for the duration of the COVID-19 declaration under Section 564(b)(1) of the Act, 21 U.S.C. section 360bbb-3(b)(1), unless  the authorization is terminated or revoked.  Performed at [facility], [place] [address] [place], [place] [address]   Basic metabolic panel     Status: Abnormal   Collection Time: 05/20/22 10:40 PM  Result Value Ref Range   Sodium 138 135 - 145 mmol/L   Potassium 3.2 (L) 3.5 - 5.1 mmol/L   Chloride 94 (L) 98 - 111 mmol/L   CO2 32 22 - 32 mmol/L   Glucose, Bld 168 (H) 70 - 99 mg/dL    Comment: Glucose reference range applies only to samples taken after fasting for at least 8 hours.   BUN 9 8 - 23 mg/dL   Creatinine, Ser 0.68 0.61 - 1.24 mg/dL   Calcium 8.5 (L) 8.9 - 10.3 mg/dL   GFR, Estimated >60 >60 mL/min    Comment: (NOTE) Calculated using the CKD-EPI Creatinine Equation (2021)    Anion gap 12 5 - 15    Comment: Performed at [facility] [address], [place], [place] [address]  CBC with Differential     Status: Abnormal   Collection Time: 05/20/22 10:40 PM  Result Value Ref Range   WBC 13.4 (H) 4.0 - 10.5 K/uL   RBC 3.48 (L) 4.22 - 5.81 MIL/uL   Hemoglobin 10.8 (L) 13.0 - 17.0 g/dL   HCT 32.4 (L) 39.0 - 52.0 %   MCV 93.1 80.0 - 100.0 fL   MCH 31.0 26.0 - 34.0 pg   MCHC 33.3 30.0 - 36.0 g/dL   RDW 13.0 11.5 - 15.5 %   Platelets 341 150 - 400 K/uL   nRBC 0.0 0.0 - 0.2 %   Neutrophils Relative % 84 %   Neutro Abs 11.1 (H) 1.7 - 7.7 K/uL   Lymphocytes Relative 7 %   Lymphs Abs 1.0 0.7 - 4.0 K/uL   Monocytes Relative 9 %   Monocytes Absolute 1.2 (H) 0.1 - 1.0 K/uL   Eosinophils Relative 0 %   Eosinophils Absolute 0.0 0.0 - 0.5 K/uL   Basophils Relative 0 %   Basophils Absolute 0.0 0.0 - 0.1 K/uL   Immature Granulocytes 0 %   Abs Immature Granulocytes 0.06 0.00 - 0.07 K/uL    Comment: Performed at [facility], [place] [address], [place], [place] [address]  Brain natriuretic peptide     Status: Abnormal   Collection Time: 05/20/22 10:40 PM  Result Value Ref Range   B Natriuretic Peptide 1,662.2 (H) 0.0 - 100.0 pg/mL    Comment: Performed at [facility] [address], [place], [place] [address]  Troponin I (High Sensitivity)     Status: Abnormal   Collection Time: 05/20/22 10:40 PM  Result Value Ref Range   Troponin I (High Sensitivity) 81 (H) <18 ng/L    Comment: (NOTE) Elevated high sensitivity troponin I (hsTnI) values and significant  changes across serial measurements may suggest ACS but many other  chronic  and acute conditions are known to elevate hsTnI results.  Refer to the "Links" section for chest pain algorithms and additional  guidance. Performed at [facility], [place] [address] [place], [place] [address]   D-dimer, quantitative     Status: Abnormal   Collection Time: 05/20/22 10:40 PM  Result Value Ref Range   D-Dimer, Quant 1.38 (H) 0.00 - 0.50 ug/mL-FEU    Comment: (NOTE) At the manufacturer cut-off value of 0.5 g/mL FEU, this assay has a negative predictive value of 95-100%.This assay is intended for use in conjunction with a clinical pretest probability (PTP) assessment model to exclude pulmonary embolism (PE) and deep venous thrombosis (DVT) in outpatients suspected of PE or DVT. Results should be correlated with clinical presentation. Performed at [facility], [place] [address], [place], [place] [address]   I-Stat venous blood gas, [facility] ED, [facility], [facility])     Status: Abnormal   Collection Time: 05/21/22 12:10 AM  Result Value Ref Range   pH, Ven 7.465 (H) 7.25 - 7.43   pCO2, Ven 51.7 44 - 60 mmHg   pO2, Ven 44 32 - 45 mmHg   Bicarbonate 37.2 (H) 20.0 - 28.0 mmol/L   TCO2 39 (H) 22 - 32 mmol/L   O2 Saturation 82 %   Acid-Base Excess 12.0 (H) 0.0 - 2.0 mmol/L   Sodium 140 135 - 145 mmol/L   Potassium 3.4 (L) 3.5 - 5.1 mmol/L   Calcium, Ion 1.11 (L) 1.15 - 1.40 mmol/L   HCT 32.0 (L) 39.0 - 52.0 %   Hemoglobin 10.9 (L) 13.0 - 17.0 g/dL   Sample type VENOUS   I-Stat arterial blood gas, ED     Status: Abnormal   Collection Time: 05/21/22  4:08 AM  Result Value Ref Range   pH, Arterial  7.473 (H) 7.35 - 7.45   pCO2 arterial 49.5 (H) 32 - 48 mmHg   pO2, Arterial 83 83 - 108 mmHg   Bicarbonate 36.3 (H) 20.0 - 28.0 mmol/L   TCO2 38 (H) 22 - 32 mmol/L   O2 Saturation 97 %   Acid-Base Excess 11.0 (H) 0.0 - 2.0 mmol/L   Sodium 139 135 - 145 mmol/L   Potassium 3.4 (L) 3.5 - 5.1 mmol/L   Calcium, Ion 1.17 1.15 - 1.40 mmol/L   HCT 29.0 (L) 39.0 - 52.0 %   Hemoglobin 9.9 (L) 13.0 - 17.0 g/dL   Patient temperature 98.6 F    Collection site RADIAL, ALLEN'S TEST ACCEPTABLE    Drawn by RT    Sample type ARTERIAL   Troponin I (High Sensitivity)     Status: Abnormal   Collection Time: 05/21/22  5:43 AM  Result Value Ref Range   Troponin I (High Sensitivity) 156 (HH) <18 ng/L    Comment: CRITICAL RESULT CALLED TO, READ BACK BY AND VERIFIED WITH Timothea Bodenheimer RN 05/21/22 0639 M KOROLESKI (NOTE) Elevated high sensitivity troponin I (hsTnI) values and significant  changes across serial measurements may suggest ACS but many other  chronic and acute conditions are known to elevate hsTnI results.  Refer to the "Links" section for chest pain algorithms and additional  guidance. Performed at [facility], [place] [address], [place], [place] [address]   Creatinine, serum     Status: None   Collection Time: 05/21/22  5:43 AM  Result Value Ref Range   Creatinine, Ser 0.89 0.61 - 1.24 mg/dL   GFR, Estimated >60 >60 mL/min    Comment: (  NOTE) Calculated using the CKD-EPI Creatinine Equation (2021) Performed at [facility], [place] [address] [place], [place] [address]   CBC with Differential/Platelet     Status: Abnormal   Collection Time: 05/21/22  5:43 AM  Result Value Ref Range   WBC 11.6 (H) 4.0 - 10.5 K/uL   RBC 2.97 (L) 4.22 - 5.81 MIL/uL   Hemoglobin 9.0 (L) 13.0 - 17.0 g/dL   HCT 28.6 (L) 39.0 - 52.0 %   MCV 96.3 80.0 - 100.0 fL   MCH 30.3 26.0 - 34.0 pg   MCHC 31.5 30.0 - 36.0 g/dL   RDW 13.2 11.5 - 15.5 %   Platelets 348 150 - 400 K/uL   nRBC 0.0 0.0 - 0.2 %    Neutrophils Relative % 70 %   Neutro Abs 8.1 (H) 1.7 - 7.7 K/uL   Lymphocytes Relative 18 %   Lymphs Abs 2.1 0.7 - 4.0 K/uL   Monocytes Relative 11 %   Monocytes Absolute 1.2 (H) 0.1 - 1.0 K/uL   Eosinophils Relative 1 %   Eosinophils Absolute 0.1 0.0 - 0.5 K/uL   Basophils Relative 0 %   Basophils Absolute 0.0 0.0 - 0.1 K/uL   Immature Granulocytes 0 %   Abs Immature Granulocytes 0.05 0.00 - 0.07 K/uL    Comment: Performed at [facility], [place] [address] [place], [place] [address]  Magnesium     Status: None   Collection Time: 05/21/22  5:43 AM  Result Value Ref Range   Magnesium 1.8 1.7 - 2.4 mg/dL    Comment: Performed at [facility] [address], [place], [place] [address]  Culture, blood (Routine X 2) w Reflex to ID Panel     Status: None (Preliminary result)   Collection Time: 05/21/22  5:43 AM   Specimen: BLOOD RIGHT HAND  Result Value Ref Range   Specimen Description BLOOD RIGHT HAND    Special Requests      BOTTLES DRAWN AEROBIC AND ANAEROBIC Blood Culture adequate volume   Culture      NO GROWTH < 12 HOURS Performed at [facility], [place] [address], [place], [place] [address]    Report Status PENDING   Culture, blood (Routine X 2) w Reflex to ID Panel     Status: None (Preliminary result)   Collection Time: 05/21/22  5:44 AM   Specimen: BLOOD LEFT FOREARM  Result Value Ref Range   Specimen Description BLOOD LEFT FOREARM    Special Requests      BOTTLES DRAWN AEROBIC AND ANAEROBIC Blood Culture adequate volume   Culture      NO GROWTH < 12 HOURS Performed at [facility], [place] [address] [place],  [address]    Report Status PENDING   Procalcitonin - Baseline     Status: None   Collection Time: 05/21/22  8:30 AM  Result Value Ref Range   Procalcitonin 0.12 ng/mL    Comment:        Interpretation: PCT (Procalcitonin) <= 0.5 ng/mL: Systemic infection (sepsis) is not likely. Local bacterial infection is possible. (NOTE)        Sepsis PCT Algorithm           Lower Respiratory Tract                                      Infection PCT  Algorithm    ----------------------------     ----------------------------         PCT < 0.25 ng/mL                PCT < 0.10 ng/mL          Strongly encourage             Strongly discourage   discontinuation of antibiotics    initiation of antibiotics    ----------------------------     -----------------------------       PCT 0.25 - 0.50 ng/mL            PCT 0.10 - 0.25 ng/mL               OR       >80% decrease in PCT            Discourage initiation of                                            antibiotics      Encourage discontinuation           of antibiotics    ----------------------------     -----------------------------         PCT >= 0.50 ng/mL              PCT 0.26 - 0.50 ng/mL               AND        <80% decrease in PCT             Encourage initiation of                                             antibiotics       Encourage continuation           of antibiotics    ----------------------------     -----------------------------        PCT >= 0.50 ng/mL                  PCT > 0.50 ng/mL               AND         increase in PCT                  Strongly encourage                                      initiation of antibiotics    Strongly encourage escalation           of antibiotics                                     -----------------------------                                           PCT <= 0.25 ng/mL  OR                                        > 80% decrease in PCT                                      Discontinue / Do not initiate                                             antibiotics  Performed at [facility], [place] [address] [place], [place] [address]   Renal function panel     Status: Abnormal   Collection Time: 05/21/22  8:30 AM  Result Value Ref Range   Sodium 140 135 - 145 mmol/L   Potassium  3.2 (L) 3.5 - 5.1 mmol/L   Chloride 97 (L) 98 - 111 mmol/L   CO2 37 (H) 22 - 32 mmol/L   Glucose, Bld 107 (H) 70 - 99 mg/dL    Comment: Glucose reference range applies only to samples taken after fasting for at least 8 hours.   BUN 12 8 - 23 mg/dL   Creatinine, Ser 0.79 0.61 - 1.24 mg/dL   Calcium 8.3 (L) 8.9 - 10.3 mg/dL   Phosphorus 2.9 2.5 - 4.6 mg/dL   Albumin 2.1 (L) 3.5 - 5.0 g/dL   GFR, Estimated >60 >60 mL/min    Comment: (NOTE) Calculated using the CKD-EPI Creatinine Equation (2021)    Anion gap 6 5 - 15    Comment: Performed at [facility] [address], [place], [place] [address]  Magnesium     Status: None   Collection Time: 05/21/22  8:30 AM  Result Value Ref Range   Magnesium 1.8 1.7 - 2.4 mg/dL    Comment: Performed at [facility] [address] [place],  [address]   VAS [place] LOWER EXTREMITY VENOUS (DVT)  Result Date: 05/21/2022  Lower Venous DVT Study Patient Name:  Douglas Perkins.  Date of Exam:   05/21/2022 Medical Rec #: [id number]            Accession #:    [id number] Date of Birth: 12/28/1945            Patient Gender: M Patient Age:   34 years Exam Location[facility]  [facility] Procedure:      VAS [place] LOWER EXTREMITY VENOUS (DVT) Referring Phys: Bretta Bang GONFA --------------------------------------------------------------------------------  Indications: Elevated d-dimer.  Comparison Study: 04/13/22 prior Performing Technologist: Archie Patten RVS  Examination Guidelines: A complete evaluation includes B-mode imaging, spectral Doppler, color Doppler, and power Doppler as needed of all accessible portions of each vessel. Bilateral testing is considered an integral part of a complete examination. Limited examinations for reoccurring indications may be performed as noted. The reflux portion of the exam is performed with the patient in reverse Trendelenburg.  +---------+---------------+---------+-----------+----------+--------------+ RIGHT     CompressibilityPhasicitySpontaneityPropertiesThrombus Aging +---------+---------------+---------+-----------+----------+--------------+ CFV      Full           Yes      Yes                                 +---------+---------------+---------+-----------+----------+--------------+  SFJ      Full                                                        +---------+---------------+---------+-----------+----------+--------------+ FV Prox  Full                                                        +---------+---------------+---------+-----------+----------+--------------+ FV Mid   Full                                                        +---------+---------------+---------+-----------+----------+--------------+ FV DistalFull                                                        +---------+---------------+---------+-----------+----------+--------------+ PFV      Full                                                        +---------+---------------+---------+-----------+----------+--------------+ POP      Full           Yes      Yes                                 +---------+---------------+---------+-----------+----------+--------------+ PTV      Full                                                        +---------+---------------+---------+-----------+----------+--------------+ PERO     Full                                                        +---------+---------------+---------+-----------+----------+--------------+   +---------+---------------+---------+-----------+----------+--------------+ LEFT     CompressibilityPhasicitySpontaneityPropertiesThrombus Aging +---------+---------------+---------+-----------+----------+--------------+ CFV      Full           Yes      Yes                                 +---------+---------------+---------+-----------+----------+--------------+ SFJ      Full                                                         +---------+---------------+---------+-----------+----------+--------------+  FV Prox  Full                                                        +---------+---------------+---------+-----------+----------+--------------+ FV Mid   Full                                                        +---------+---------------+---------+-----------+----------+--------------+ FV DistalFull                                                        +---------+---------------+---------+-----------+----------+--------------+ PFV      Full                                                        +---------+---------------+---------+-----------+----------+--------------+ POP      Full           Yes      Yes                                 +---------+---------------+---------+-----------+----------+--------------+ PTV      Full                                                        +---------+---------------+---------+-----------+----------+--------------+ PERO     Full                                                        +---------+---------------+---------+-----------+----------+--------------+     Summary: BILATERAL: - No evidence of deep vein thrombosis seen in the lower extremities, bilaterally. -No evidence of popliteal cyst, bilaterally.   *See table(s) above for measurements and observations. Electronically signed by Harold Barban MD on 05/21/2022 at 2:16:55 PM.    Final    CT CHEST WO CONTRAST  Result Date: 05/21/2022 CLINICAL DATA:  Dyspnea. Central airway disease suspected with acute on chronic respiratory failure. EXAM: CT CHEST WITHOUT CONTRAST TECHNIQUE: Multidetector CT imaging of the chest was performed following the standard protocol without IV contrast. RADIATION DOSE REDUCTION: This exam was performed according to the departmental dose-optimization program which includes automated exposure control, adjustment of the mA and/or kV according to patient  size and/or use of iterative reconstruction technique. COMPARISON:  CT angio chest 04/09/2022 FINDINGS: Cardiovascular: Heart size is mildly enlarged. Aortic atherosclerosis and 3 vessel coronary artery calcification. No pericardial effusion. Abandon pacer lead identified within the right ventricle. Mediastinum/Nodes: Thyroid gland appears normal. There is scratch set the proximal trachea appears normal  in caliber. The distal trachea and right and left mainstem bronchus demonstrate significantly diminished AP diameter. Distal trachea for example measures 5 mm in AP dimension on the current exam, image 40/3. This appears similar to the previous exam. Esophagus demonstrates no significant findings. No enlarged axillary or mediastinal lymph nodes. The hilar lymph nodes are suboptimally evaluated due to lack of IV contrast material. Lungs/Pleura: Small to moderate bilateral pleural effusions are increased in volume from the previous exam. Bilateral, upper and lower lobe areas of ground-glass and airspace consolidation are identified. New when compared with the prior examination. Upper Abdomen: No acute abnormality. Cholecystectomy. Cyst within segment 2 of the liver measures 1.5 cm. Musculoskeletal: Spondylosis identified within the thoracic spine. Remote right posterolateral tenth rib and left ninth rib fractures. IMPRESSION: 1. Interval increase in volume of bilateral pleural effusions. 2. Bilateral, upper and lower lobe areas of ground-glass and airspace consolidation are identified. Findings compatible with multifocal pneumonia and or ARDS. 3. Significant diminished AP diameter of the distal trachea and right and left mainstem bronchus. 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 4. Coronary artery calcifications. 5.  Aortic Atherosclerosis (ICD10-I70.0). Electronically Signed   By: Kerby Moors M.D.   On: 05/21/2022 07:01   DG Chest Port 1 View  Result  Date: 05/21/2022 CLINICAL DATA:  Shortness of breath. EXAM: PORTABLE CHEST 1 VIEW COMPARISON:  05/17/2022. FINDINGS: The heart is enlarged and the mediastinal contour stable. There is atherosclerotic calcification of the aorta. The pulmonary vasculature is distended. Airspace opacities are noted in the right lung, improved in the right upper lobe and increased at the lung bases bilaterally. There are peripheral airspace opacities in the mid left lung. No effusion or pneumothorax. A left PICC line is stable in position. A neurostimulator device is noted on the right with leads coursing over the cervical region on the right. No acute osseous abnormality. IMPRESSION: 1. Cardiomegaly with pulmonary vascular congestion. 2. Increased airspace opacities in the lungs bilaterally with improvement in the right upper lobe. Electronically Signed   By: Brett Fairy M.D.   On: 05/21/2022 01:09    Pending Labs Unresulted Labs (From admission, onward)     Start     Ordered   05/28/22 0500  Creatinine, serum  (enoxaparin (LOVENOX)    CrCl >/= 30 ml/min)  Weekly,   R     Comments: while on enoxaparin therapy    05/21/22 0427   05/22/22 0500  Procalcitonin  Daily,   R      05/21/22 0747   05/22/22 0500  Renal function panel  Tomorrow morning,   R        05/21/22 1037   05/22/22 0500  Magnesium  Tomorrow morning,   R        05/21/22 1037   05/22/22 0500  CBC  Tomorrow morning,   R        05/21/22 1037   05/22/22 0500  Brain natriuretic peptide  Tomorrow morning,   R        05/21/22 1037   05/21/22 1122  MRSA Next Gen by PCR, Nasal  (MRSA Screening)  Once,   R        05/21/22 1121   05/21/22 0426  CBC  (enoxaparin (LOVENOX)    CrCl >/= 30 ml/min)  Once,   R       Comments: Baseline for enoxaparin therapy IF NOT ALREADY DRAWN.  Notify MD if PLT < 100 K.  05/21/22 0427   05/21/22 0327  Blood gas, arterial  Once,   R        05/21/22 0326            Vitals/Pain Today's Vitals   05/21/22 1330 05/21/22  1345 05/21/22 1400 05/21/22 1415  BP:      Pulse: (!) 128 (!) 56 (!) 120 (!) 135  Resp: (!) 22 19 (!) 27 (!) 24  Temp:    98.9 F (37.2 C)  TempSrc:      SpO2: 92% 92% 92% 90%  Weight:      Height:      PainSc:        Isolation Precautions No active isolations  Medications Medications  sodium chloride flush (NS) 0.9 % injection 3 mL (3 mLs Intravenous Given 05/21/22 1027)  sodium chloride flush (NS) 0.9 % injection 3 mL (has no administration in time range)  0.9 %  sodium chloride infusion (has no administration in time range)  acetaminophen (TYLENOL) tablet 650 mg (has no administration in time range)  ondansetron (ZOFRAN) injection 4 mg (has no administration in time range)  enoxaparin (LOVENOX) injection 40 mg (40 mg Subcutaneous Given 05/21/22 1020)  furosemide (LASIX) injection 40 mg (40 mg Intravenous Given 05/21/22 1020)  acetaminophen (TYLENOL) tablet 650 mg (has no administration in time range)  ondansetron (ZOFRAN) injection 4 mg (has no administration in time range)  vancomycin (VANCOCIN) IVPB 1000 mg/200 mL premix (has no administration in time range)  ceFEPIme (MAXIPIME) 2 g in sodium chloride 0.9 % 100 mL IVPB (0 g Intravenous Stopped 05/21/22 1126)  methylPREDNISolone sodium succinate (SOLU-MEDROL) 125 mg/2 mL injection 80 mg (80 mg Intravenous Given 05/21/22 1019)  aspirin EC tablet 81 mg (81 mg Oral Given 05/21/22 1313)  atorvastatin (LIPITOR) tablet 40 mg (40 mg Oral Given 05/21/22 1312)  QUEtiapine (SEROQUEL) tablet 300 mg (has no administration in time range)  pantoprazole (PROTONIX) EC tablet 40 mg (40 mg Oral Given 05/21/22 1313)  tamsulosin (FLOMAX) capsule 0.4 mg (has no administration in time range)  melatonin tablet 10 mg (has no administration in time range)  divalproex (DEPAKOTE) DR tablet 1,000 mg (has no administration in time range)  gabapentin (NEURONTIN) capsule 300 mg (300 mg Oral Given 05/21/22 1313)  mometasone-formoterol (DULERA) 100-5 MCG/ACT inhaler  2 puff (2 puffs Inhalation Not Given 05/21/22 1304)  dorzolamide (TRUSOPT) 2 % ophthalmic solution 1 drop (1 drop Right Eye Not Given 05/21/22 1304)  latanoprost (XALATAN) 0.005 % ophthalmic solution 1 drop (1 drop Right Eye Not Given 05/21/22 1304)  timolol (TIMOPTIC) 0.5 % ophthalmic solution 1 drop (1 drop Right Eye Not Given 05/21/22 1305)  potassium chloride SA (KLOR-CON M) CR tablet 40 mEq (has no administration in time range)  ceFEPIme (MAXIPIME) 2 g in sodium chloride 0.9 % 100 mL IVPB (0 g Intravenous Stopped 05/21/22 0346)  vancomycin (VANCOREADY) IVPB 2000 mg/400 mL (0 mg Intravenous Stopped 05/21/22 0814)  ipratropium-albuterol (DUONEB) 0.5-2.5 (3) MG/3ML nebulizer solution 3 mL (3 mLs Nebulization Given 05/21/22 1201)  magnesium sulfate IVPB 1 g 100 mL (0 g Intravenous Stopped 05/21/22 1312)    Mobility walks with device     Focused Assessments Cardiac Assessment Handoff:    Lab Results  Component Value Date   CKTOTAL 847 (H) 04/09/2022   TROPONINI <0.03 11/21/2017   Lab Results  Component Value Date   DDIMER 1.38 (H) 05/20/2022   Does the Patient currently have chest pain? No   , Pulmonary Assessment Handoff:  Lung sounds: Bilateral Breath Sounds: Diminished, Clear L Breath Sounds: Diminished R Breath Sounds: Diminished O2 Device: Nasal Cannula O2 Flow Rate (L/min): 5 L/min    R Recommendations: See Admitting Provider Note  Report given to:   Additional Notes:

## 2022-05-21 NOTE — ED Provider Notes (Signed)
Patient signed out to me at shift change.   Hx of CHF and recent multifocal pneumonia.  Discharge to SNF on 4L [place].  Found to be in the 80s on O2 today.  BIB EMS.    CXR with vascular congestion and opacities.  Starting abx.  Starting bipap.  Looks improved with bipap.  Seen by and discussed with Dr. Sedonia Small.   Physical Exam  BP (!) 152/50   Pulse 77   Temp (!) 97.3 F (36.3 C) (Oral)   Resp 18   Ht 6' (1.829 m)   Wt 119.4 kg   SpO2 99%   BMI 35.70 kg/m   Physical Exam Moderate respiratory distress, belly breathing, crackles present Procedures  .Critical Care  Performed by: Montine Circle, PA-C Authorized by: Montine Circle, PA-C   Critical care provider statement:    Critical care time (minutes):  35   Critical care was necessary to treat or prevent imminent or life-threatening deterioration of the following conditions:  Respiratory failure   Critical care was time spent personally by me on the following activities:  Development of treatment plan with patient or surrogate, discussions with consultants, evaluation of patient's response to treatment, examination of patient, ordering and review of laboratory studies, ordering and review of radiographic studies, ordering and performing treatments and interventions, pulse oximetry, re-evaluation of patient's condition and review of old charts   ED Course / MDM    Medical Decision Making Case discussed with Dr. Claria Dice, who is appreciated for admitting.  Amount and/or Complexity of Data Reviewed Labs: ordered. Radiology: ordered.  Risk Prescription drug management. Decision regarding hospitalization.          Montine Circle, PA-C 05/21/22 0154    Maudie Flakes, MD 05/21/22 [phone]

## 2022-05-21 NOTE — H&P (Signed)
PCP:   Pcp, No   Chief Complaint:  Hypoxia, shortness of breath  HPI: This is a 77 y.o. 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  He has had 2 recent admissions 05/11/2022-05/18/2022 with CHF and multifocal pneumonia also 12/102023-04/21/2022 with GBS bacteremia and PPM infection.  Most recent discharge to SNF on 05/18/2022.  Patient brought in today for shortness of breath.  When discharged on the 18th, he was discharged on 4L oxygen.  Today his oxygen sats on routine vital check was in the 80s.  He was placed on a nonrebreather and transferred to the ER. In the ER, patient still hypoxic with use of assessor muscles.  He was placed on BiPAP by EDP.  Patient more comfortable  Review of Systems:  Difficult to obtain outpatient on BiPAP  Past Medical History: Past Medical History:  Diagnosis Date   Bipolar 1 disorder ([place])    Cancer ([place])    prostate   Glaucoma    Hypertension    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[place])    [place] thrombus of cardiac apex    Presence of permanent cardiac pacemaker 2017   SA node dysfunction   Sleep apnea    does not use CPAP   TIA (transient ischemic attack)    Past Surgical History:  Procedure Laterality Date   BUBBLE STUDY  04/14/2022   Procedure: BUBBLE STUDY;  Surgeon: Lelon Perla, MD;  Location: [place];  Service: Cardiovascular;;   CHOLECYSTECTOMY     COLONOSCOPY WITH PROPOFOL N/A 01/31/2017   Procedure: COLONOSCOPY WITH PROPOFOL;  Surgeon: Manya Silvas, MD;  Location: [facility] ENDOSCOPY;  Service: Endoscopy;  Laterality: N/A;   DRUG INDUCED ENDOSCOPY N/A 05/12/2020   Procedure: DRUG INDUCED ENDOSCOPY;  Surgeon: Melida Quitter, MD;  Location: [place];  Service: ENT;  Laterality: N/A;   IMPLANTATION OF HYPOGLOSSAL NERVE STIMULATOR N/A 07/21/2020   Procedure: IMPLANTATION OF HYPOGLOSSAL NERVE STIMULATOR;  Surgeon:  Melida Quitter, MD;  Location: [place];  Service: ENT;  Laterality: N/A;   LEAD EXTRACTION N/A 04/17/2022   Procedure: LEAD EXTRACTION;  Surgeon: Vickie Epley, MD;  Location: [place] [facility];  Service: Cardiovascular;  Laterality: N/A;   LEG SURGERY Left    distal   Pace maker placement     PROSTATE SURGERY     PROSTATECTOMY     SKIN CANCER EXCISION  2006,2007   Merkle Cell Carcinoma   TEE WITHOUT CARDIOVERSION N/A 04/14/2022   Procedure: TRANSESOPHAGEAL ECHOCARDIOGRAM (TEE);  Surgeon: Lelon Perla, MD;  Location: [facility] ENDOSCOPY;  Service: Cardiovascular;  Laterality: N/A;    Medications: Prior to Admission medications   Medication Sig Start Date End Date Taking? Authorizing Provider  acetaminophen (TYLENOL) 500 MG tablet Take 1,000 mg by mouth in the morning and at bedtime. May also 2 tablets by mouth daily as needed for knee pain   Yes [provider]  albuterol (VENTOLIN HFA) 108 (90 Base) MCG/ACT inhaler Inhale 1 puff into the lungs every 8 (eight) hours as needed for wheezing or shortness of breath. 08/26/21  Yes [provider]  aspirin EC 81 MG tablet Take 1 tablet (81 mg total) by mouth daily. Reported on 08/18/2015 03/09/21  Yes Clapacs, Douglas Reno, MD  atorvastatin (LIPITOR) 40 MG tablet Take 1 tablet (40 mg total) by mouth daily. Patient taking differently: Take 40 mg by mouth at bedtime.  03/10/21  Yes Clapacs, Douglas Reno, MD  bisacodyl (DULCOLAX) 5 MG EC tablet Take 1 tablet (5 mg total) by mouth daily as needed for moderate constipation. 03/09/21  Yes Clapacs, Douglas Reno, MD  carvedilol (COREG CR) 10 MG 24 hr capsule Take 1 capsule (10 mg total) by mouth daily. 05/18/22  Yes Thurnell Lose, MD  dextromethorphan 7.5 MG/5ML SYRP Take 15 mg by mouth every 6 (six) hours as needed (for cough).   Yes [provider]  divalproex (DEPAKOTE) 500 MG DR tablet Take 2 tablets (1,000 mg total) by mouth at bedtime. 03/09/21  Yes Clapacs, Douglas Reno, MD   dorzolamide (TRUSOPT) 2 % ophthalmic solution Place 1 drop into the right eye 2 (two) times daily. 03/09/21  Yes Clapacs, Douglas Reno, MD  fluticasone-salmeterol (ADVAIR HFA) [phone] MCG/ACT inhaler Inhale 2 puffs into the lungs 2 (two) times daily.   Yes [provider]  furosemide (LASIX) 20 MG tablet Take 1 tablet (20 mg total) by mouth daily. 05/18/22 06/17/22 Yes Thurnell Lose, MD  gabapentin (NEURONTIN) 300 MG capsule Take 1 capsule (300 mg total) by mouth 2 (two) times daily. 03/09/21  Yes Clapacs, Douglas Reno, MD  ipratropium-albuterol (DUONEB) 0.5-2.5 (3) MG/3ML SOLN Take 3 mLs by nebulization once.   Yes [provider]  LACTOBACILLUS PROBIOTIC PO Take 1 capsule by mouth daily.   Yes [provider]  latanoprost (XALATAN) 0.005 % ophthalmic solution Place 1 drop into the right eye 2 (two) times daily. 03/09/21  Yes Clapacs, Douglas Reno, MD  Melatonin 10 MG TABS Take 10 mg by mouth at bedtime.   Yes [provider]  Multiple Vitamins-Minerals (MULTIVITAL PO) Take 1 tablet by mouth daily.   Yes [provider]  nystatin powder Apply 1 Application topically 2 (two) times daily. Apply topically twice a day to groin region, bilateral buttocks, sacrum and inner upper thighs   Yes [provider]  pantoprazole (PROTONIX) 40 MG tablet Take 1 tablet (40 mg total) by mouth 2 (two) times daily. 03/09/21  Yes Clapacs, Douglas Reno, MD  penicillin G IVPB Inject 24 Million Units into the vein daily. Indication:  GBS ICD infection First Dose: Yes Last Day of Therapy:  05/29/22 Labs - Once weekly:  CBC/D and BMP, Labs - Every other week:  ESR and CRP Method of administration: Elastomeric (Continuous infusion) Pull PICC line at the completion of IV antibiotics Method of administration may be changed at the discretion of home infusion pharmacist based upon assessment of the patient and/or caregiver's ability to self-administer the medication ordered. 04/20/22 05/31/22 Yes  Douglas Pour, MD  potassium chloride (KLOR-CON) 10 MEQ tablet Take 1 tablet (10 mEq total) by mouth daily. 05/18/22  Yes Thurnell Lose, MD  QUEtiapine (SEROQUEL) 300 MG tablet Take 1 tablet (300 mg total) by mouth at bedtime. 03/09/21  Yes Clapacs, Douglas Reno, MD  tamsulosin (FLOMAX) 0.4 MG CAPS capsule Take 0.4 mg by mouth at bedtime.   Yes [provider]  timolol (TIMOPTIC) 0.5 % ophthalmic solution Place 1 drop into the right eye 2 (two) times daily. 03/09/21  Yes Clapacs, Douglas Reno, MD    Allergies:   Allergies  Allergen Reactions   Sulfa Antibiotics Hives   Feldene [Piroxicam] Hives    Social History:  reports that he has never smoked. He has never used smokeless tobacco. He reports that he does not drink alcohol and does not use drugs.  Family History: Family History  Problem Relation Age  of Onset   Stroke Father    CAD Paternal Grandmother    CAD Paternal Grandfather     Physical Exam: Vitals:   05/21/22 0102 05/21/22 0130 05/21/22 0145 05/21/22 0202  BP:   (!) 95/55 (!) 136/115  Pulse: 77 71 82 72  Resp: 18   (!) 26  Temp:      TempSrc:      SpO2: 99% 98% 99% 97%  Weight:      Height:        General:  Alert and oriented times three, well-developed, ill-appearing patient on BiPAP Eyes: PERRLA, pink conjunctiva, no scleral icterus ENT: Dry oral mucosa, neck supple, no thyromegaly.  Positive JVD Lungs: clear to ascultation, no wheeze, no crackles, no use of accessory muscles Cardiovascular: regular rate and rhythm, no regurgitation, no gallops, no murmurs. No carotid bruits, no JVD Abdomen: soft, positive BS, non-tender, non-distended, no organomegaly, not an acute abdomen GU: not examined Neuro: CN II - XII grossly intact, sensation intact Musculoskeletal: strength 5/5 all extremities, no clubbing, cyanosis or edema Skin: no rash, no subcutaneous crepitation, no decubitus Psych: Ill-appearing patient   Labs on Admission:  Recent Labs    05/18/22 0437  05/20/22 2240 05/21/22 0010  NA 140 138 140  K 3.5 3.2* 3.4*  CL 95* 94*  --   CO2 39* 32  --   GLUCOSE 106* 168*  --   BUN 16 9  --   CREATININE 0.90 0.68  --   CALCIUM 8.5* 8.5*  --     Recent Labs    05/18/22 0437 05/20/22 2240 05/21/22 0010  WBC 9.7 13.4*  --   NEUTROABS 5.9 11.1*  --   HGB 9.5* 10.8* 10.9*  HCT 29.0* 32.4* 32.0*  MCV 95.7 93.1  --   PLT 303 341  --     Recent Labs    05/20/22 2240  DDIMER 1.38*    Micro Results: Recent Results (from the past 240 hour(s))  Resp panel by RT-PCR (RSV, Flu A&B, Covid) Anterior Nasal Swab     Status: None   Collection Time: 05/11/22  3:01 AM   Specimen: Anterior Nasal Swab  Result Value Ref Range Status   SARS Coronavirus 2 by RT PCR NEGATIVE NEGATIVE Final    Comment: (NOTE) SARS-CoV-2 target nucleic acids are NOT DETECTED.  The SARS-CoV-2 RNA is generally detectable in upper respiratory specimens during the acute phase of infection. The lowest concentration of SARS-CoV-2 viral copies this assay can detect is 138 copies/mL. A negative result does not preclude SARS-Cov-2 infection and should not be used as the sole basis for treatment or other patient management decisions. A negative result may occur with  improper specimen collection/handling, submission of specimen other than nasopharyngeal swab, presence of viral mutation(s) within the areas targeted by this assay, and inadequate number of viral copies(<138 copies/mL). A negative result must be combined with clinical observations, patient history, and epidemiological information. The expected result is Negative.  Fact Sheet for Patients:  EntrepreneurPulse.com.au  Fact Sheet for Healthcare Providers:  IncredibleEmployment.be  This test is no t yet approved or cleared by the [place] FDA and  has been authorized for detection and/or diagnosis of SARS-CoV-2 by FDA under an Emergency Use Authorization (EUA). This  EUA will remain  in effect (meaning this test can be used) for the duration of the COVID-19 declaration under Section 564(b)(1) of the Act, 21 U.S.C.section 360bbb-3(b)(1), unless the authorization is terminated  or revoked sooner.  Influenza A by PCR NEGATIVE NEGATIVE Final   Influenza B by PCR NEGATIVE NEGATIVE Final    Comment: (NOTE) The Xpert Xpress SARS-CoV-2/FLU/RSV plus assay is intended as an aid in the diagnosis of influenza from Nasopharyngeal swab specimens and should not be used as a sole basis for treatment. Nasal washings and aspirates are unacceptable for Xpert Xpress SARS-CoV-2/FLU/RSV testing.  Fact Sheet for Patients: EntrepreneurPulse.com.au  Fact Sheet for Healthcare Providers: IncredibleEmployment.be  This test is not yet approved or cleared by the [place] FDA and has been authorized for detection and/or diagnosis of SARS-CoV-2 by FDA under an Emergency Use Authorization (EUA). This EUA will remain in effect (meaning this test can be used) for the duration of the COVID-19 declaration under Section 564(b)(1) of the Act, 21 U.S.C. section 360bbb-3(b)(1), unless the authorization is terminated or revoked.     Resp Syncytial Virus by PCR NEGATIVE NEGATIVE Final    Comment: (NOTE) Fact Sheet for Patients: EntrepreneurPulse.com.au  Fact Sheet for Healthcare Providers: IncredibleEmployment.be  This test is not yet approved or cleared by the [place] FDA and has been authorized for detection and/or diagnosis of SARS-CoV-2 by FDA under an Emergency Use Authorization (EUA). This EUA will remain in effect (meaning this test can be used) for the duration of the COVID-19 declaration under Section 564(b)(1) of the Act, 21 U.S.C. section 360bbb-3(b)(1), unless the authorization is terminated or revoked.  Performed at [facility], [place] [address], [place],  [place] [address]   Respiratory (~20 pathogens) panel by PCR     Status: None   Collection Time: 05/11/22  3:01 AM   Specimen: Nasopharyngeal Swab; Respiratory  Result Value Ref Range Status   Adenovirus NOT DETECTED NOT DETECTED Final   Coronavirus 229E NOT DETECTED NOT DETECTED Final    Comment: (NOTE) The Coronavirus on the Respiratory Panel, DOES NOT test for the novel  Coronavirus (2019 nCoV)    Coronavirus HKU1 NOT DETECTED NOT DETECTED Final   Coronavirus NL63 NOT DETECTED NOT DETECTED Final   Coronavirus OC43 NOT DETECTED NOT DETECTED Final   Metapneumovirus NOT DETECTED NOT DETECTED Final   Rhinovirus / Enterovirus NOT DETECTED NOT DETECTED Final   Influenza A NOT DETECTED NOT DETECTED Final   Influenza B NOT DETECTED NOT DETECTED Final   Parainfluenza Virus 1 NOT DETECTED NOT DETECTED Final   Parainfluenza Virus 2 NOT DETECTED NOT DETECTED Final   Parainfluenza Virus 3 NOT DETECTED NOT DETECTED Final   Parainfluenza Virus 4 NOT DETECTED NOT DETECTED Final   Respiratory Syncytial Virus NOT DETECTED NOT DETECTED Final   Bordetella pertussis NOT DETECTED NOT DETECTED Final   Bordetella Parapertussis NOT DETECTED NOT DETECTED Final   Chlamydophila pneumoniae NOT DETECTED NOT DETECTED Final   Mycoplasma pneumoniae NOT DETECTED NOT DETECTED Final    Comment: Performed at [facility], [place]. [address] [place], [place] [address]  Blood culture (routine x 2)     Status: None   Collection Time: 05/11/22  3:43 AM   Specimen: BLOOD LEFT HAND  Result Value Ref Range Status   Specimen Description BLOOD LEFT HAND  Final   Special Requests   Final    BOTTLES DRAWN AEROBIC AND ANAEROBIC Blood Culture results may not be optimal due to an inadequate volume of blood received in culture bottles   Culture   Final    NO GROWTH 5 DAYS Performed at [facility], [place] [address] [place], [place] [address]    Report Status  05/16/2022 FINAL  Final  MRSA Next Gen by PCR, Nasal     Status:  None   Collection Time: 05/13/22 11:37 AM   Specimen: Nasal Mucosa; Nasal Swab  Result Value Ref Range Status   MRSA by PCR Next Gen NOT DETECTED NOT DETECTED Final    Comment: (NOTE) The GeneXpert MRSA Assay (FDA approved for NASAL specimens only), is one component of a comprehensive MRSA colonization surveillance program. It is not intended to diagnose MRSA infection nor to guide or monitor treatment for MRSA infections. Test performance is not FDA approved in patients less than 85 years old. Performed at [facility], [place] [address], [place], [place] [address]   Resp panel by RT-PCR (RSV, Flu A&B, Covid) Anterior Nasal Swab     Status: None   Collection Time: 05/20/22 10:08 PM   Specimen: Anterior Nasal Swab  Result Value Ref Range Status   SARS Coronavirus 2 by RT PCR NEGATIVE NEGATIVE Final    Comment: (NOTE) SARS-CoV-2 target nucleic acids are NOT DETECTED.  The SARS-CoV-2 RNA is generally detectable in upper respiratory specimens during the acute phase of infection. The lowest concentration of SARS-CoV-2 viral copies this assay can detect is 138 copies/mL. A negative result does not preclude SARS-Cov-2 infection and should not be used as the sole basis for treatment or other patient management decisions. A negative result may occur with  improper specimen collection/handling, submission of specimen other than nasopharyngeal swab, presence of viral mutation(s) within the areas targeted by this assay, and inadequate number of viral copies(<138 copies/mL). A negative result must be combined with clinical observations, patient history, and epidemiological information. The expected result is Negative.  Fact Sheet for Patients:  EntrepreneurPulse.com.au  Fact Sheet for Healthcare Providers:  IncredibleEmployment.be  This test is no t yet approved or cleared by the [place] FDA and  has been authorized for detection and/or  diagnosis of SARS-CoV-2 by FDA under an Emergency Use Authorization (EUA). This EUA will remain  in effect (meaning this test can be used) for the duration of the COVID-19 declaration under Section 564(b)(1) of the Act, 21 U.S.C.section 360bbb-3(b)(1), unless the authorization is terminated  or revoked sooner.       Influenza A by PCR NEGATIVE NEGATIVE Final   Influenza B by PCR NEGATIVE NEGATIVE Final    Comment: (NOTE) The Xpert Xpress SARS-CoV-2/FLU/RSV plus assay is intended as an aid in the diagnosis of influenza from Nasopharyngeal swab specimens and should not be used as a sole basis for treatment. Nasal washings and aspirates are unacceptable for Xpert Xpress SARS-CoV-2/FLU/RSV testing.  Fact Sheet for Patients: EntrepreneurPulse.com.au  Fact Sheet for Healthcare Providers: IncredibleEmployment.be  This test is not yet approved or cleared by the [place] FDA and has been authorized for detection and/or diagnosis of SARS-CoV-2 by FDA under an Emergency Use Authorization (EUA). This EUA will remain in effect (meaning this test can be used) for the duration of the COVID-19 declaration under Section 564(b)(1) of the Act, 21 U.S.C. section 360bbb-3(b)(1), unless the authorization is terminated or revoked.     Resp Syncytial Virus by PCR NEGATIVE NEGATIVE Final    Comment: (NOTE) Fact Sheet for Patients: EntrepreneurPulse.com.au  Fact Sheet for Healthcare Providers: IncredibleEmployment.be  This test is not yet approved or cleared by the [place] FDA and has been authorized for detection and/or diagnosis of SARS-CoV-2 by FDA under an Emergency Use Authorization (EUA). This EUA will remain in effect (meaning this test can be used) for  the duration of the COVID-19 declaration under Section 564(b)(1) of the Act, 21 U.S.C. section 360bbb-3(b)(1), unless the authorization is terminated  or revoked.  Performed at [facility], [place] [address], [place], [place] [address]      Radiological Exams on Admission: DG Chest Port 1 View  Result Date: 05/21/2022 CLINICAL DATA:  Shortness of breath. EXAM: PORTABLE CHEST 1 VIEW COMPARISON:  05/17/2022. FINDINGS: The heart is enlarged and the mediastinal contour stable. There is atherosclerotic calcification of the aorta. The pulmonary vasculature is distended. Airspace opacities are noted in the right lung, improved in the right upper lobe and increased at the lung bases bilaterally. There are peripheral airspace opacities in the mid left lung. No effusion or pneumothorax. A left PICC line is stable in position. A neurostimulator device is noted on the right with leads coursing over the cervical region on the right. No acute osseous abnormality. IMPRESSION: 1. Cardiomegaly with pulmonary vascular congestion. 2. Increased airspace opacities in the lungs bilaterally with improvement in the right upper lobe. Electronically Signed   By: Brett Fairy M.D.   On: 05/21/2022 01:09    Assessment/Plan Present on Admission:  Acute respiratory failure with hypoxia (HCC) Acute on chronic heart failure with preserved LVEF -Admits to cardiac telemetry -CHF order set initiated -IV Lasix, strict I's and O's, daily weights -Patient on BiPAP, continue   Bilateral pneumonia -Persistent finding since 12/15 with minimal improvement.   -CT chest, repeat blood cultures ordered and antibiotics -Defer to a.m. team-ID consult possibly needed -ESR, CRP ordered -RSV, COVID, influenza negative   Physical deconditioning -PT/OT consult placed   OSA Has right-sided implantable inspire device.    Coronary artery disease involving native coronary artery of native heart -Stable, resume Coreg, atorvastatin, aspirin   Bipolar I disorder (HCC) -Continue Depakote, Seroquel   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[place]) -S/p radiation therapy -Per  oncology  BPH -Resume Flomax   Hyperlipidemia, mixed -Stable, atorvastatin resumed  Recent pacemaker infection Recent GBS bacteremia  Douglas Perkins 05/21/2022, 2:55 AM

## 2022-05-21 NOTE — Progress Notes (Signed)
Lower extremity venous duplex has been completed.   Preliminary results in CV Proc.   Douglas Perkins 05/21/2022 10:38 AM

## 2022-05-22 DIAGNOSIS — I5033 Acute on chronic diastolic (congestive) heart failure: Secondary | ICD-10-CM | POA: Diagnosis not present

## 2022-05-22 DIAGNOSIS — J9601 Acute respiratory failure with hypoxia: Secondary | ICD-10-CM | POA: Diagnosis not present

## 2022-05-22 DIAGNOSIS — J9621 Acute and chronic respiratory failure with hypoxia: Secondary | ICD-10-CM | POA: Diagnosis not present

## 2022-05-22 DIAGNOSIS — I10 Essential (primary) hypertension: Secondary | ICD-10-CM | POA: Diagnosis not present

## 2022-05-22 LAB — RENAL FUNCTION PANEL
Albumin: 2.2 g/dL — ABNORMAL LOW (ref 3.5–5.0)
Anion gap: 9 (ref 5–15)
BUN: 13 mg/dL (ref 8–23)
CO2: 33 mmol/L — ABNORMAL HIGH (ref 22–32)
Calcium: 8.4 mg/dL — ABNORMAL LOW (ref 8.9–10.3)
Chloride: 97 mmol/L — ABNORMAL LOW (ref 98–111)
Creatinine, Ser: 0.71 mg/dL (ref 0.61–1.24)
GFR, Estimated: >60 mL/min (ref >60)
Glucose, Bld: 135 mg/dL — ABNORMAL HIGH (ref 70–99)
Phosphorus: 2.6 mg/dL (ref 2.5–4.6)
Potassium: 3.8 mmol/L (ref 3.5–5.1)
Sodium: 139 mmol/L (ref 135–145)

## 2022-05-22 LAB — CBC
HCT: 31.2 % — ABNORMAL LOW (ref 39.0–52.0)
Hemoglobin: 9.9 g/dL — ABNORMAL LOW (ref 13.0–17.0)
MCH: 30.7 pg (ref 26.0–34.0)
MCHC: 31.7 g/dL (ref 30.0–36.0)
MCV: 96.6 fL (ref 80.0–100.0)
Platelets: 308 10*3/uL (ref 150–400)
RBC: 3.23 MIL/uL — ABNORMAL LOW (ref 4.22–5.81)
RDW: 13.3 % (ref 11.5–15.5)
WBC: 10.6 10*3/uL — ABNORMAL HIGH (ref 4.0–10.5)
nRBC: 0 % (ref 0.0–0.2)

## 2022-05-22 LAB — MAGNESIUM: Magnesium: 1.8 mg/dL (ref 1.7–2.4)

## 2022-05-22 LAB — BRAIN NATRIURETIC PEPTIDE: B Natriuretic Peptide: 951.6 pg/mL — ABNORMAL HIGH (ref 0.0–100.0)

## 2022-05-22 LAB — PROCALCITONIN: Procalcitonin: 0.13 ng/mL

## 2022-05-22 MED ORDER — ALTEPLASE 2 MG IJ SOLR
2.0000 mg | Freq: Once | INTRAMUSCULAR | Status: AC
  Administered 2022-05-22: 2 mg
  Filled 2022-05-22: qty 2

## 2022-05-22 MED ORDER — CARVEDILOL PHOSPHATE ER 10 MG PO CP24
10.0000 mg | ORAL_CAPSULE | Freq: Every day | ORAL | Status: DC
  Administered 2022-05-22 – 2022-05-27 (×6): 10 mg via ORAL
  Filled 2022-05-22 (×6): qty 1

## 2022-05-22 MED ORDER — LABETALOL HCL 5 MG/ML IV SOLN: 10.0000 mg | INTRAVENOUS | Status: DC | PRN

## 2022-05-22 MED ORDER — PENICILLIN G POTASSIUM 20000000 UNITS IJ SOLR
12.0000 10*6.[IU] | Freq: Two times a day (BID) | INTRAVENOUS | Status: DC
  Filled 2022-05-22: qty 12

## 2022-05-22 MED ORDER — PENICILLIN G POTASSIUM 20000000 UNITS IJ SOLR: 12.0000 10*6.[IU] | Freq: Two times a day (BID) | INTRAVENOUS | Status: DC

## 2022-05-22 MED ORDER — MUPIROCIN 2 % EX OINT
1.0000 | TOPICAL_OINTMENT | Freq: Two times a day (BID) | CUTANEOUS | Status: AC
  Administered 2022-05-22 – 2022-05-26 (×9): 1 via NASAL
  Filled 2022-05-22 (×3): qty 22

## 2022-05-22 MED ORDER — FUROSEMIDE 10 MG/ML IJ SOLN
60.0000 mg | Freq: Two times a day (BID) | INTRAMUSCULAR | Status: DC
  Administered 2022-05-23 – 2022-05-25 (×6): 60 mg via INTRAVENOUS
  Filled 2022-05-22 (×6): qty 6

## 2022-05-22 MED ORDER — GERHARDT'S BUTT CREAM
TOPICAL_CREAM | Freq: Two times a day (BID) | CUTANEOUS | Status: DC
  Filled 2022-05-22: qty 1

## 2022-05-22 MED ORDER — ORAL CARE MOUTH RINSE: 15.0000 mL | OROMUCOSAL | Status: DC | PRN

## 2022-05-22 MED ORDER — CHLORHEXIDINE GLUCONATE CLOTH 2 % EX PADS
6.0000 | MEDICATED_PAD | Freq: Every day | CUTANEOUS | Status: DC
  Administered 2022-05-23: 6 via TOPICAL

## 2022-05-22 MED ORDER — CEFAZOLIN SODIUM-DEXTROSE 2-4 GM/100ML-% IV SOLN
2.0000 g | Freq: Three times a day (TID) | INTRAVENOUS | Status: DC
  Administered 2022-05-22 – 2022-05-27 (×15): 2 g via INTRAVENOUS
  Filled 2022-05-22 (×18): qty 100

## 2022-05-22 NOTE — Progress Notes (Signed)
Id/asp brief note  Patient known to id service  Pacer infection s/p extraction; gbs bsi; 6 week abx planned eot 1/29 Has had 2 admissions last few weeks for acute hypoxic respiratory failure unclear if true pneumonia  This admission 1/21. No fever. Hypoxic needing 4 liters o2. Bnp elevated. Procal serial < 0.3. cte multifocal opacity and bilateral pleural effusion  Previous echo 1/12 ef 55%  Recent speech swallow has dysphagia and recommended nectar thick diet    A/p Gbs infection/pacer infection s/p extraction; planned 6 week therapy will 1/29 Acute hypoxic resp distress Multifocal opacity on chest ct; no pe  Agree with primary team ?fluid over load. Could transition pcn g to cefazolin to decrease fluid load or just go to linezolid directly to finish pacer infection tx by 1/29  Diuresis as primary team  As he is not severely ill could hold abx for PnA coverage outside of pacer infection tx. However, if fever, worsening hypoxia, leukocytosis reasonable to treat for pneumonia with amp/sulb or augmentin   Discussed with team

## 2022-05-22 NOTE — TOC Progression Note (Signed)
Transition of Care (TOC) - Progression Note    Patient Details  Name: Douglas Perkins. MRN: [id number] Date of Birth: [birth year]  Transition of Care [facility]) CM/SW Contact  Jinger Neighbors, [place] Phone Number: 05/22/2022, 4:54 PM  Clinical Narrative:     CSW left a voicemail for [facility] inquiring if pt can return to SNF at [facility] once pt is medically stable.        Expected Discharge Plan and Services                                               Social Determinants of Health (SDOH) Interventions SDOH Screenings   Food Insecurity: No Food Insecurity (05/22/2022)  Housing: Low Risk  (05/22/2022)  Transportation Needs: No Transportation Needs (05/22/2022)  Utilities: Not At Risk (05/22/2022)  Alcohol Screen: Low Risk  (03/03/2021)  Tobacco Use: Low Risk  (05/20/2022)    Readmission Risk Interventions    05/17/2022    2:11 PM  Readmission Risk Prevention Plan  Transportation Screening Complete  Medication Review (RN Care Manager) Complete  PCP or Specialist appointment within 3-5 days of discharge Complete  HRI or Home Care Consult Complete  SW Recovery Care/Counseling Consult Complete  Palliative Care Screening Not [place] Complete

## 2022-05-22 NOTE — Progress Notes (Signed)
PROGRESS NOTE  Douglas Perkins. 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  Patient was hypoxic with significant work of breathing while in ED and started on BiPAP.  BNP elevated to 1662.  Troponin 81.  WBC 13.4.  VBG suggestive of metabolic alkalosis.  CXR with cardiomegaly, pulmonary vascular congestion and increased airspace opacities in the lungs bilaterally with improvement in RUL.  D-dimer slightly elevated to 1.38.  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  Blood cultures obtained.  Patient was started on IV Lasix, BiPAP and broad-spectrum antibiotics and admitted.  The next day, lower extremity venous Doppler negative for DVT.  CTA chest negative for PE but patchy bilateral airspace disease involving all lobes of both lungs suggesting multifocal pneumonia, small to moderate right-sided and small left pleural effusion.  Patient's breathing improved.  He was weaned to 4 L by nasal cannula.  Blood cultures NGTD.  No fever or leukocytosis.  Pro-Cal remains negative.  Active pneumonia felt to be less likely.   Antibiotics de-escalated to IV Ancef by ID to minimize fluid overload.  Remains on IV Lasix.  Subjective: Seen and examined this morning.  No major events overnight of this morning.  He is sleepy but wakes to voice.  No complaints.  Denies chest pain, dyspnea, GI or UTI symptoms.  Coughing intermittently.  Objective: Vitals:   05/21/22 1949 05/21/22 2123 05/22/22 0432 05/22/22 0813  BP:  (!) 143/63 (!) 154/83   Pulse:  (!) 104 (!) 102   Resp:  (!) 24 (!) 24   Temp:  98.3 F (36.8 C) 98.3 F (36.8 C)   TempSrc:  Oral Oral   SpO2: 94%  92% 96%  Weight:   111.1 kg   Height:        Examination:  GENERAL: No apparent distress.  Nontoxic. HEENT: MMM.  Vision and hearing grossly intact.  NECK: Supple.  No apparent JVD.  RESP:  No IWOB.  Fair aeration bilaterally.  Some upper airway sounds. CVS:  RRR. Heart sounds normal.  ABD/GI/GU: BS+. Abd soft, NTND.  MSK/EXT:  Moves extremities.  Has some degree of RLE atrophy. SKIN: no apparent skin lesion or wound NEURO: Sleepy but wakes to voice.  Oriented x 4 except year.  No apparent focal neuro deficit. PSYCH: Calm. Normal affect.   Procedures:  None  Microbiology summarized: [id number], influenza and RSV PCR nonreactive. Blood cultures NGTD  Assessment and plan: Principal Problem:   Acute on chronic respiratory failure with hypoxemia (HCC) Active Problems:   Benign essential hypertension   Hyperlipidemia, mixed   Bilateral pneumonia   Bipolar I disorder (HCC)   Coronary artery disease involving native coronary artery of native heart  Obesity (BMI 30-39.9)   Cardiac pacemaker   History of Merkel cell carcinoma   History of sinoatrial node dysfunction   OSA (obstructive sleep apnea)   Infection of pacemaker lead wire (HCC)   Bacteremia due to group B Streptococcus   Acute on chronic diastolic CHF (congestive heart failure) (HCC)   Physical deconditioning   Morbid obesity ([place])  Acute on chronic respiratory failure with  hypoxemia: Most likely due to CHF versus pneumonia.  BNP elevated to 1660 (higher than baseline).  CXR suggested CHF.  CT findings likely from recent pneumonia versus active infection.  CTA chest negative for PE.  Hospital from 1/11-1/18 for CHF and multifocal pneumonia.  At that time, he was diuresed with IV Lasix 60 mg twice daily and discharged on p.o. Lasix 20 mg daily and 4 L oxygen.  Started on IV Lasix.  Difficult to assess fluid status due to body habitus.  I&O incomplete.  Renal function and BP stable.  BNP improved. -Continue IV Lasix 40 mg twice daily.  Will increase home Lasix on discharge. -Appreciate help by ID about changing antibiotics to oral to help with IV fluid load. -Strict intake and output, daily weight, renal functions and electrolytes -Wean off BiPAP as able.  Minimum oxygen to keep saturation above 88% despite home 4 L. -PT/OT  Acute on chronic diastolic CHF: Limited TTE on 1/12 with LVEF of 55 to 60%, indeterminate DD -Management as above.  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  Blood cultures NGTD.  Procalcitonin negative. -Antibiotics de-escalated to IV Ancef to minimize fluid overload  Multifocal opacity on CT chest: CTA chest negative for PE but concerning for multifocal opacity.  However, patient without fever, leukocytosis or sepsis physiology.  Pro-Cal negative.  Doubt active pneumonia or infection. -Antibiotics as above.  OSA: Has implantable inspire device.  -Minimum oxygen to keep saturation above 88%.  Physical deconditioning -PT/OT  Bipolar 1 disorder: Stable. -Continue home Depakote and Seroquel  Elevated troponin/history of CAD: Likely demand ischemia. -Management as above.  Elevated D-dimer: D-dimer 1.38.  LE venous Doppler and CTA chest negative for VTE.  Essential hypertension: Elevated BP today. -Resume home Coreg. -Continue holding home  lisinopril  BPH without LUTS -Monitor urine output -Continue on Flomax  Morbid obesity Body mass index is 33.23 kg/m.      DVT prophylaxis:  enoxaparin (LOVENOX) injection 40 mg Start: 05/21/22 0800 SCDs Start: 05/21/22 0426  Code Status: Full code Family Communication: None at bedside Level of care: Progressive Status is: Inpatient Remains inpatient appropriate because: Due to acute on chronic respiratory failure with hypoxemia and acute CHF   Final disposition: SNF Consultants:  None  55 minutes with more than 50% spent in reviewing records, counseling patient/family and coordinating care.   Sch Meds:  Scheduled Meds:  aspirin EC  81 mg Oral Daily   atorvastatin  40 mg Oral Daily   Chlorhexidine Gluconate Cloth  6 each Topical Daily   Chlorhexidine Gluconate Cloth  6 each Topical Q0600   divalproex  1,000 mg Oral QHS   dorzolamide  1 drop Right Eye BID   enoxaparin (LOVENOX) injection  40 mg Subcutaneous Q24H   furosemide  40 mg Intravenous Q12H   gabapentin  300 mg Oral BID   latanoprost  1 drop Right Eye BID   melatonin  10 mg Oral QHS   mometasone-formoterol  2 puff Inhalation BID   mupirocin ointment  1 Application Nasal BID   pantoprazole  40 mg Oral BID   QUEtiapine  300 mg Oral QHS   sodium chloride flush  3 mL Intravenous Q12H   tamsulosin  0.4 mg Oral QHS   timolol  1 drop Right Eye BID   Continuous Infusions:  sodium chloride Stopped (05/21/22 2004)    ceFAZolin (ANCEF) IV     PRN Meds:.sodium chloride, acetaminophen, acetaminophen, ipratropium-albuterol, ondansetron (ZOFRAN) IV, ondansetron (ZOFRAN) IV, mouth rinse, sodium chloride flush  Antimicrobials: Anti-infectives (From admission, onward)    Start     Dose/Rate Route Frequency Ordered Stop   05/22/22 1800  penicillin G potassium 12 Million Units in dextrose 5 % 500 mL continuous infusion  Status:  Discontinued        12 Million Units 41.7 mL/hr over 12 Hours Intravenous Every 12 hours  05/22/22 1112 05/22/22 1328   05/22/22 1800  ceFAZolin (ANCEF) IVPB 2g/100 mL premix        2 g 200 mL/hr over 30 Minutes Intravenous Every 8 hours 05/22/22 1328     05/22/22 1200  penicillin G potassium 12 Million Units in dextrose 5 % 500 mL continuous infusion  Status:  Discontinued        12 Million Units 41.7 mL/hr over 12 Hours Intravenous Every 12 hours 05/22/22 1112 05/22/22 1112   05/21/22 1800  vancomycin (VANCOCIN) IVPB 1000 mg/200 mL premix  Status:  Discontinued        1,000 mg 200 mL/hr over 60 Minutes Intravenous Every 12 hours 05/21/22 0546 05/22/22 1109   05/21/22 0800  ceFEPIme (MAXIPIME) 2 g in sodium chloride 0.9 % 100 mL IVPB  Status:  Discontinued        2 g 200 mL/hr over 30 Minutes Intravenous Every 8 hours 05/21/22 0546 05/22/22 1109   05/21/22 0130  ceFEPIme (MAXIPIME) 2 g in sodium chloride 0.9 % 100 mL IVPB        2 g 200 mL/hr over 30 Minutes Intravenous  Once 05/21/22 0117 05/21/22 0346   05/21/22 0130  vancomycin (VANCOREADY) IVPB 2000 mg/400 mL        2,000 mg 200 mL/hr over 120 Minutes Intravenous  Once 05/21/22 0128 05/21/22 0814        I have personally reviewed the following labs and images: CBC: Recent Labs  Lab 05/16/22 0514 05/17/22 0407 05/18/22 0437 05/20/22 2240 05/21/22 0010 05/21/22 0408 05/21/22 0543 05/22/22 0607  WBC 9.7 10.4 9.7 13.4*  --   --  11.6* 10.6*  NEUTROABS 6.3 6.5 5.9 11.1*  --   --  8.1*  --   HGB 9.1* 9.4* 9.5* 10.8* 10.9* 9.9* 9.0* 9.9*  HCT 29.1* 28.9* 29.0* 32.4* 32.0* 29.0* 28.6* 31.2*  MCV 98.0 94.4 95.7 93.1  --   --  96.3 96.6  PLT 287 302 303 341  --   --  348 308   BMP &GFR Recent Labs  Lab 05/16/22 0514 05/17/22 0407 05/18/22 0437 05/20/22 2240 05/21/22 0010 05/21/22 0408 05/21/22 0543 05/21/22 0830 05/22/22 0607  NA 140 141 140 138 140 139  --  140 139  K 4.1 3.8 3.5 3.2* 3.4* 3.4*  --  3.2* 3.8  CL 101 97* 95* 94*  --   --   --  97* 97*  CO2 31 34* 39* 32  --   --   --  37* 33*  GLUCOSE  117* 117* 106* 168*  --   --   --  107* 135*  BUN '15 15 16 9  '$ --   --   --  12 13  CREATININE 0.71 0.79 0.90 0.68  --   --  0.89 0.79 0.71  CALCIUM 8.6* 8.8* 8.5* 8.5*  --   --   --  8.3* 8.4*  MG 1.8 1.6*  --   --   --   --  1.8 1.8 1.8  PHOS  --   --   --   --   --   --   --  2.9 2.6   Estimated Creatinine Clearance: 98 mL/min (by C-G formula based on SCr of 0.71 mg/dL). Liver & Pancreas: Recent Labs  Lab 05/21/22 0830 05/22/22 0607  ALBUMIN 2.1* 2.2*   No results for input(s): "LIPASE", "AMYLASE" in the last 168 hours. No results for input(s): "AMMONIA" in the last 168 hours. Diabetic: No results for input(s): "HGBA1C" in the last 72 hours. No results for input(s): "GLUCAP" in the last 168 hours. Cardiac Enzymes: No results for input(s): "CKTOTAL", "CKMB", "CKMBINDEX", "TROPONINI" in the last 168 hours. No results for input(s): "PROBNP" in the last 8760 hours. Coagulation Profile: No results for input(s): "INR", "PROTIME" in the last 168 hours. Thyroid Function Tests: No results for input(s): "TSH", "T4TOTAL", "FREET4", "T3FREE", "THYROIDAB" in the last 72 hours. Lipid Profile: No results for input(s): "CHOL", "HDL", "LDLCALC", "TRIG", "CHOLHDL", "LDLDIRECT" in the last 72 hours. Anemia Panel: No results for input(s): "VITAMINB12", "FOLATE", "FERRITIN", "TIBC", "IRON", "RETICCTPCT" in the last 72 hours. Urine analysis:    Component Value Date/Time   COLORURINE YELLOW 04/09/2022 2135   APPEARANCEUR HAZY (A) 04/09/2022 2135   APPEARANCEUR Clear 05/24/2015 1328   LABSPEC 1.017 04/09/2022 2135   PHURINE 6.0 04/09/2022 2135   GLUCOSEU NEGATIVE 04/09/2022 2135   HGBUR SMALL (A) 04/09/2022 2135   BILIRUBINUR NEGATIVE 04/09/2022 2135   BILIRUBINUR Negative 05/24/2015 [place] 04/09/2022 2135   PROTEINUR 30 (A) 04/09/2022 2135   NITRITE NEGATIVE 04/09/2022 2135   LEUKOCYTESUR NEGATIVE 04/09/2022 2135   Sepsis Labs: Invalid input(s): "PROCALCITONIN",  "LACTICIDVEN"  Microbiology: Recent Results (from the past 240 hour(s))  MRSA Next Gen by PCR, Nasal     Status: None   Collection Time: 05/13/22 11:37 AM   Specimen: Nasal Mucosa; Nasal Swab  Result Value Ref Range Status   MRSA by PCR Next Gen NOT DETECTED NOT DETECTED Final    Comment: (NOTE) The GeneXpert MRSA Assay (FDA approved for NASAL specimens only), is one component of a comprehensive MRSA colonization surveillance program. It is not intended to diagnose MRSA infection nor to guide or monitor treatment for MRSA infections. Test performance is not FDA approved in patients less than 45 years old. Performed at [facility], [place] [address], [place], [place] [address]   Resp panel by RT-PCR (RSV, Flu A&B, Covid) Anterior Nasal Swab     Status: None   Collection Time: 05/20/22 10:08 PM   Specimen: Anterior Nasal Swab  Result Value Ref Range Status   SARS Coronavirus 2 by RT PCR NEGATIVE NEGATIVE Final    Comment: (NOTE) SARS-CoV-2 target nucleic acids are NOT DETECTED.  The SARS-CoV-2 RNA is generally detectable in upper respiratory specimens during the acute phase of infection. The lowest concentration of SARS-CoV-2 viral copies this assay can detect is 138 copies/mL. A negative result does not preclude SARS-Cov-2 infection and should not be used as the sole basis for treatment or other patient management decisions. A negative result may occur with  improper  specimen collection/handling, submission of specimen other than nasopharyngeal swab, presence of viral mutation(s) within the areas targeted by this assay, and inadequate number of viral copies(<138 copies/mL). A negative result must be combined with clinical observations, patient history, and epidemiological information. The expected result is Negative.  Fact Sheet for Patients:  EntrepreneurPulse.com.au  Fact Sheet for Healthcare Providers:   IncredibleEmployment.be  This test is no t yet approved or cleared by the [place] FDA and  has been authorized for detection and/or diagnosis of SARS-CoV-2 by FDA under an Emergency Use Authorization (EUA). This EUA will remain  in effect (meaning this test can be used) for the duration of the COVID-19 declaration under Section 564(b)(1) of the Act, 21 U.S.C.section 360bbb-3(b)(1), unless the authorization is terminated  or revoked sooner.       Influenza A by PCR NEGATIVE NEGATIVE Final   Influenza B by PCR NEGATIVE NEGATIVE Final    Comment: (NOTE) The Xpert Xpress SARS-CoV-2/FLU/RSV plus assay is intended as an aid in the diagnosis of influenza from Nasopharyngeal swab specimens and should not be used as a sole basis for treatment. Nasal washings and aspirates are unacceptable for Xpert Xpress SARS-CoV-2/FLU/RSV testing.  Fact Sheet for Patients: EntrepreneurPulse.com.au  Fact Sheet for Healthcare Providers: IncredibleEmployment.be  This test is not yet approved or cleared by the [place] FDA and has been authorized for detection and/or diagnosis of SARS-CoV-2 by FDA under an Emergency Use Authorization (EUA). This EUA will remain in effect (meaning this test can be used) for the duration of the COVID-19 declaration under Section 564(b)(1) of the Act, 21 U.S.C. section 360bbb-3(b)(1), unless the authorization is terminated or revoked.     Resp Syncytial Virus by PCR NEGATIVE NEGATIVE Final    Comment: (NOTE) Fact Sheet for Patients: EntrepreneurPulse.com.au  Fact Sheet for Healthcare Providers: IncredibleEmployment.be  This test is not yet approved or cleared by the [place] FDA and has been authorized for detection and/or diagnosis of SARS-CoV-2 by FDA under an Emergency Use Authorization (EUA). This EUA will remain in effect (meaning this test can be used) for  the duration of the COVID-19 declaration under Section 564(b)(1) of the Act, 21 U.S.C. section 360bbb-3(b)(1), unless the authorization is terminated or revoked.  Performed at [facility], [place] [address] [place], [place] [address]   Culture, blood (Routine X 2) w Reflex to ID Panel     Status: None (Preliminary result)   Collection Time: 05/21/22  5:43 AM   Specimen: BLOOD RIGHT HAND  Result Value Ref Range Status   Specimen Description BLOOD RIGHT HAND  Final   Special Requests   Final    BOTTLES DRAWN AEROBIC AND ANAEROBIC Blood Culture adequate volume   Culture   Final    NO GROWTH 1 DAY Performed at [facility], [place] [address] [place], [place] [address]    Report Status PENDING  Incomplete  Culture, blood (Routine X 2) w Reflex to ID Panel     Status: None (Preliminary result)   Collection Time: 05/21/22  5:44 AM   Specimen: BLOOD LEFT FOREARM  Result Value Ref Range Status   Specimen Description BLOOD LEFT FOREARM  Final   Special Requests   Final    BOTTLES DRAWN AEROBIC AND ANAEROBIC Blood Culture adequate volume   Culture   Final    NO GROWTH 1 DAY Performed at [facility], [place] [address] [place], [place] [address]    Report Status PENDING  Incomplete  MRSA Next Gen by PCR, Nasal     Status: Abnormal   Collection Time: 05/21/22  6:49 PM   Specimen: Nasal Mucosa; Nasal Swab  Result Value Ref Range Status   MRSA by PCR Next Gen DETECTED (A) NOT DETECTED Final    Comment: RESULT CALLED TO, READ BACK BY AND VERIFIED WITH:  C/ MIKE N., RN 05/21/22 2050 A. LAFRANCE (NOTE) The GeneXpert MRSA Assay (FDA approved for NASAL specimens only), is one component of a comprehensive MRSA colonization surveillance program. It is not intended to diagnose MRSA infection nor to guide or monitor treatment for MRSA infections. Test performance is not FDA approved in patients less than 52 years old. Performed at [facility], [place] [address],  [place], [place] [address]     Radiology Studies: CT Angio Chest Pulmonary Embolism (PE) W or WO Contrast  Result Date: 05/21/2022 CLINICAL DATA:  Positive D-dimer.  PE suspected. EXAM: CT ANGIOGRAPHY CHEST WITH CONTRAST TECHNIQUE: Multidetector CT imaging of the chest was performed using the standard protocol during bolus administration of intravenous contrast. Multiplanar CT image reconstructions and MIPs were obtained to evaluate the vascular anatomy. RADIATION DOSE REDUCTION: This exam was performed according to the departmental dose-optimization program which includes automated exposure control, adjustment of the mA and/or kV according to patient size and/or use of iterative reconstruction technique. CONTRAST:  81m OMNIPAQUE IOHEXOL 350 MG/ML SOLN COMPARISON:  05/21/2022 chest CT without contrast. FINDINGS: Cardiovascular: Heart size upper normal to mildly increased. Coronary artery calcification is evident. There is mild atherosclerotic calcification of the abdominal aorta without aneurysm. There is no filling defect within the opacified pulmonary arteries to suggest the presence of an acute pulmonary embolus. Left PICC line tip is positioned in the mid SVC. Mediastinum/Nodes: No mediastinal lymphadenopathy. Upper normal lymph nodes are seen in the right hilum. There is no hilar lymphadenopathy. The esophagus has normal imaging features. There is no axillary lymphadenopathy. Lungs/Pleura: Patchy bilateral airspace disease is again noted involving all lobes of both lungs. Small to moderate right and small left pleural effusions evident. Upper Abdomen: Small hepatic cysts noted. No followup imaging is recommended. Musculoskeletal: No worrisome lytic or sclerotic osseous abnormality. Review of the MIP images confirms the above findings. IMPRESSION: 1. No CT evidence for acute pulmonary embolus. 2. Patchy bilateral airspace disease involving all lobes of both lungs. Imaging features compatible with multifocal  pneumonia. 3. Small to moderate right and small left pleural effusions. 4.  Aortic Atherosclerosis (ICD10-I70.0). Electronically Signed   By: EMisty StanleyM.D.   On: 05/21/2022 15:05      Kenny Rea T. GEast [place] If 7PM-7AM, please contact night-coverage [email or web address] 05/22/2022, 2:39 PM

## 2022-05-22 NOTE — Progress Notes (Signed)
Physical Therapy Evaluation Patient Details Name: Douglas Perkins. MRN: [id number] DOB: 02-16-1946 Today's Date: 05/22/2022  History of Present Illness  Pt is a 77 y/o M admitted on 1/20 for SOB. 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 Pt on 3L [place] at baseline. Presently has B PNA, and acute respiratory failure. Cleared for PE.  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  Clinical Impression  Pt was seen for mobility on bed to sit, declining sitting to standing but did have BP controlled with no orthostatic findings with his light headed feelings.  Pt is expecting to return to SNF when hosp is done, back to [facility].  Follow along with him to get pt to chair, to work on standing and to progress to gait when appropriate.  Pt is attended by wife who is supportive of all goals and plans for rehab stay again.  Pt is appropriate for SNF care due to his dense weakness, difficulty to even stand and his PLOF being independent on rollator to get to meals in ALF.  Follow acutely for goals of PT as outlined below.       Recommendations for follow up therapy are one component of a multi-disciplinary discharge planning process, led by the attending physician.  Recommendations may be updated based on patient status, additional functional criteria and insurance authorization.  Follow Up Recommendations Skilled nursing-short term rehab (<3 hours/day) Can patient physically be transported by private vehicle: No    Assistance Recommended at Discharge Frequent or constant Supervision/Assistance  Patient can return home with the following  A lot of help with walking and/or transfers;A lot of help with bathing/dressing/bathroom;Assistance with cooking/housework;Direct supervision/assist for medications management;Direct supervision/assist for financial management;Assist for  transportation;Help with stairs or ramp for entrance    Equipment Recommendations None recommended by PT (await decisions of SNF)  Recommendations for Other Services       Functional Status Assessment Patient has had a recent decline in their functional status and demonstrates the ability to make significant improvements in function in a reasonable and predictable amount of time.     Precautions / Restrictions Precautions Precautions: Fall Precaution Comments: leadless pacer Restrictions Weight Bearing Restrictions: No      Mobility  Bed Mobility Overal bed mobility: Needs Assistance Bed Mobility: Rolling, Sidelying to Sit, Sit to Sidelying Rolling: Mod assist Sidelying to sit: Max assist     Sit to sidelying: Mod assist      Transfers                   General transfer comment: declines to stand    Ambulation/Gait                  Stairs            Wheelchair Mobility    Modified Rankin (Stroke Patients Only)       Balance Overall balance assessment: Needs assistance Sitting-balance support: Feet supported Sitting balance-Leahy Scale: Fair                                       Pertinent Vitals/Pain Pain Assessment Pain Assessment: Faces Faces Pain Scale: No hurt    Home Living Family/patient expects to be discharged to:: Assisted living  Home Equipment: Rollator (4 wheels);Shower seat - built in;Hospital bed Additional Comments: has therapy sessions 3x a week at home setting with help for meds, meals and laundry    Prior Function Prior Level of Function : Needs assist       Physical Assist : Mobility (physical) Mobility (physical): Transfers;Bed mobility;Gait   Mobility Comments: pt has come from SNF where he was in regular therapy sessions and restarting standing and gait       Hand Dominance   Dominant Hand: Right    Extremity/Trunk Assessment   Upper Extremity  Assessment Upper Extremity Assessment: Generalized weakness    Lower Extremity Assessment Lower Extremity Assessment: Generalized weakness    Cervical / Trunk Assessment Cervical / Trunk Assessment: Kyphotic  Communication   Communication: HOH  Cognition Arousal/Alertness: Awake/alert Behavior During Therapy: WFL for tasks assessed/performed Overall Cognitive Status: Impaired/Different from baseline Area of Impairment: Problem solving, Awareness, Following commands, Safety/judgement, Attention                   Current Attention Level: Selective   Following Commands: Follows one step commands with increased time Safety/Judgement: Decreased awareness of safety, Decreased awareness of deficits Awareness: Intellectual Problem Solving: Requires verbal cues, Requires tactile cues General Comments: Pt has been assisted at SNF to stand and move, but declining to stand and asking to return to bed when PT worked with him, was lightheaded but no orthostatic readings were found        General Comments General comments (skin integrity, edema, etc.): pt is up to side of bed with help and is weak, maintaining O2 sats and has no higher HR than 128 with alarm    Exercises     Assessment/Plan    PT Assessment Patient needs continued PT services  PT Problem List Decreased strength;Decreased activity tolerance;Decreased balance;Decreased mobility;Decreased coordination;Cardiopulmonary status limiting activity       PT Treatment Interventions DME instruction;Gait training;Functional mobility training;Therapeutic activities;Therapeutic exercise;Balance training;Neuromuscular re-education;Patient/family education    PT Goals (Current goals can be found in the Care Plan section)  Acute Rehab PT Goals Patient Stated Goal: get home and feel better PT Goal Formulation: With patient/family Time For Goal Achievement: 06/05/22 Potential to Achieve Goals: Fair    Frequency Min 3X/week      Co-evaluation               AM-PAC PT "6 Clicks" Mobility  Outcome Measure Help needed turning from your back to your side while in a flat bed without using bedrails?: A Lot Help needed moving from lying on your back to sitting on the side of a flat bed without using bedrails?: A Lot Help needed moving to and from a bed to a chair (including a wheelchair)?: A Lot Help needed standing up from a chair using your arms (e.g., wheelchair or bedside chair)?: A Lot Help needed to walk in hospital room?: Total Help needed climbing 3-5 steps with a railing? : Total 6 Click Score: 10    End of Session Equipment Utilized During Treatment: Oxygen Activity Tolerance: Patient limited by fatigue;Treatment limited secondary to medical complications (Comment) Patient left: in bed;with call bell/phone within reach;with bed alarm set;with family/visitor present Nurse Communication: Mobility status PT Visit Diagnosis: Other abnormalities of gait and mobility (R26.89);Muscle weakness (generalized) (M62.81);Difficulty in walking, not elsewhere classified (R26.2)    Time: 3664-4034 PT Time Calculation (min) (ACUTE ONLY): 25 min   Charges:   PT Evaluation $PT Eval Moderate Complexity: 1 Mod PT Treatments $Therapeutic  Activity: 8-22 mins       Ramond Dial 05/22/2022, 1:19 PM  Mee Hives, PT PhD Acute Rehab Dept. Number: [place] and [place]

## 2022-05-22 NOTE — Progress Notes (Signed)
   05/22/22 1806  Assess: MEWS Score  BP (!) 182/99  MAP (mmHg) 115  Pulse Rate (!) 110  ECG Heart Rate (!) 112  SpO2 (!) 88 %  Assess: MEWS Score  MEWS Temp 0  MEWS Systolic 0  MEWS Pulse 2  MEWS RR 0  MEWS LOC 0  MEWS Score 2  MEWS Score Color Yellow  Assess: if the MEWS score is Yellow or Red  Were vital signs taken at a resting state? Yes  Focused Assessment No change from prior assessment  Does the patient meet 2 or more of the SIRS criteria? No  MEWS guidelines implemented *See Row Information* Yes  Treat  MEWS Interventions Escalated (See documentation below)  Pain Scale 0-10  Pain Score 0  Take Vital Signs  Increase Vital Sign Frequency  Yellow: Q 2hr X 2 then Q 4hr X 2, if remains yellow, continue Q 4hrs  Escalate  MEWS: Escalate Yellow: discuss with charge nurse/RN and consider discussing with provider and RRT  Provider Notification  Provider Name/Title Bettina Gavia MD  Date Provider Notified 05/22/22  Time Provider Notified 5809  Method of Notification Page  Notification Reason Other (Comment) (BP elevated 180)  Provider response See new orders  Date of Provider Response 05/22/22  Time of Provider Response 1900  Assess: SIRS CRITERIA  SIRS Temperature  0  SIRS Pulse 1  SIRS Respirations  0  SIRS WBC 0  SIRS Score Sum  1

## 2022-05-23 ENCOUNTER — Inpatient Hospital Stay (HOSPITAL_COMMUNITY): Payer: Medicare Other

## 2022-05-23 DIAGNOSIS — J9601 Acute respiratory failure with hypoxia: Secondary | ICD-10-CM | POA: Diagnosis not present

## 2022-05-23 DIAGNOSIS — J9621 Acute and chronic respiratory failure with hypoxia: Secondary | ICD-10-CM | POA: Diagnosis not present

## 2022-05-23 DIAGNOSIS — I10 Essential (primary) hypertension: Secondary | ICD-10-CM | POA: Diagnosis not present

## 2022-05-23 DIAGNOSIS — I5033 Acute on chronic diastolic (congestive) heart failure: Secondary | ICD-10-CM | POA: Diagnosis not present

## 2022-05-23 DIAGNOSIS — G9340 Encephalopathy, unspecified: Secondary | ICD-10-CM | POA: Insufficient documentation

## 2022-05-23 LAB — RENAL FUNCTION PANEL
Albumin: 2.3 g/dL — ABNORMAL LOW (ref 3.5–5.0)
Anion gap: 9 (ref 5–15)
BUN: 11 mg/dL (ref 8–23)
CO2: 34 mmol/L — ABNORMAL HIGH (ref 22–32)
Calcium: 8.5 mg/dL — ABNORMAL LOW (ref 8.9–10.3)
Chloride: 94 mmol/L — ABNORMAL LOW (ref 98–111)
Creatinine, Ser: 0.77 mg/dL (ref 0.61–1.24)
GFR, Estimated: >60 mL/min (ref >60)
Glucose, Bld: 134 mg/dL — ABNORMAL HIGH (ref 70–99)
Phosphorus: 3.3 mg/dL (ref 2.5–4.6)
Potassium: 3.4 mmol/L — ABNORMAL LOW (ref 3.5–5.1)
Sodium: 137 mmol/L (ref 135–145)

## 2022-05-23 LAB — AMMONIA: Ammonia: 43 umol/L — ABNORMAL HIGH (ref 9–35)

## 2022-05-23 LAB — CBC
HCT: 31.3 % — ABNORMAL LOW (ref 39.0–52.0)
Hemoglobin: 10.1 g/dL — ABNORMAL LOW (ref 13.0–17.0)
MCH: 30.2 pg (ref 26.0–34.0)
MCHC: 32.3 g/dL (ref 30.0–36.0)
MCV: 93.7 fL (ref 80.0–100.0)
Platelets: 347 10*3/uL (ref 150–400)
RBC: 3.34 MIL/uL — ABNORMAL LOW (ref 4.22–5.81)
RDW: 13.2 % (ref 11.5–15.5)
WBC: 10.5 10*3/uL (ref 4.0–10.5)
nRBC: 0 % (ref 0.0–0.2)

## 2022-05-23 LAB — HEPATIC FUNCTION PANEL
ALT: 70 U/L — ABNORMAL HIGH (ref 0–44)
AST: 52 U/L — ABNORMAL HIGH (ref 15–41)
Albumin: 2.6 g/dL — ABNORMAL LOW (ref 3.5–5.0)
Alkaline Phosphatase: 57 U/L (ref 38–126)
Bilirubin, Direct: <0.1 mg/dL (ref 0.0–0.2)
Total Bilirubin: 0.4 mg/dL (ref 0.3–1.2)
Total Protein: 7.2 g/dL (ref 6.5–8.1)

## 2022-05-23 LAB — BLOOD GAS, VENOUS
Acid-Base Excess: 13.1 mmol/L — ABNORMAL HIGH (ref 0.0–2.0)
Bicarbonate: 39.9 mmol/L — ABNORMAL HIGH (ref 20.0–28.0)
O2 Saturation: 69.9 %
Patient temperature: 37
pCO2, Ven: 63 mmHg — ABNORMAL HIGH (ref 44–60)
pH, Ven: 7.41 (ref 7.25–7.43)
pO2, Ven: 46 mmHg — ABNORMAL HIGH (ref 32–45)

## 2022-05-23 LAB — VITAMIN B12: Vitamin B-12: 517 pg/mL (ref 180–914)

## 2022-05-23 LAB — VALPROIC ACID LEVEL: Valproic Acid Lvl: 34 ug/mL — ABNORMAL LOW (ref 50.0–100.0)

## 2022-05-23 LAB — TSH: TSH: 0.355 u[IU]/mL (ref 0.350–4.500)

## 2022-05-23 LAB — MAGNESIUM: Magnesium: 1.7 mg/dL (ref 1.7–2.4)

## 2022-05-23 LAB — BRAIN NATRIURETIC PEPTIDE: B Natriuretic Peptide: 1054.5 pg/mL — ABNORMAL HIGH (ref 0.0–100.0)

## 2022-05-23 LAB — PROCALCITONIN: Procalcitonin: 0.12 ng/mL

## 2022-05-23 MED ORDER — LACTULOSE 10 GM/15ML PO SOLN
20.0000 g | Freq: Two times a day (BID) | ORAL | Status: DC
  Administered 2022-05-23 – 2022-05-26 (×6): 20 g via ORAL
  Filled 2022-05-23 (×7): qty 30

## 2022-05-23 MED ORDER — REVEFENACIN 175 MCG/3ML IN SOLN
175.0000 ug | Freq: Every day | RESPIRATORY_TRACT | Status: DC
  Administered 2022-05-23 – 2022-05-27 (×5): 175 ug via RESPIRATORY_TRACT
  Filled 2022-05-23 (×5): qty 3

## 2022-05-23 MED ORDER — POTASSIUM CHLORIDE CRYS ER 20 MEQ PO TBCR
40.0000 meq | EXTENDED_RELEASE_TABLET | ORAL | Status: AC
  Administered 2022-05-23 (×2): 40 meq via ORAL
  Filled 2022-05-23 (×2): qty 2

## 2022-05-23 MED ORDER — QUETIAPINE FUMARATE 50 MG PO TABS
200.0000 mg | ORAL_TABLET | Freq: Every day | ORAL | Status: DC
  Administered 2022-05-23 – 2022-05-26 (×4): 200 mg via ORAL
  Filled 2022-05-23 (×5): qty 4

## 2022-05-23 MED ORDER — BUDESONIDE 0.5 MG/2ML IN SUSP
0.5000 mg | Freq: Two times a day (BID) | RESPIRATORY_TRACT | Status: DC
  Administered 2022-05-23 – 2022-05-27 (×8): 0.5 mg via RESPIRATORY_TRACT
  Filled 2022-05-23 (×8): qty 2

## 2022-05-23 MED ORDER — GUAIFENESIN ER 600 MG PO TB12
600.0000 mg | ORAL_TABLET | Freq: Two times a day (BID) | ORAL | Status: DC
  Administered 2022-05-23 – 2022-05-27 (×8): 600 mg via ORAL
  Filled 2022-05-23 (×8): qty 1

## 2022-05-23 MED ORDER — GUAIFENESIN-DM 100-10 MG/5ML PO SYRP
5.0000 mL | ORAL_SOLUTION | ORAL | Status: DC | PRN
  Administered 2022-05-23 – 2022-05-26 (×2): 5 mL via ORAL
  Filled 2022-05-23 (×2): qty 5

## 2022-05-23 MED ORDER — ARFORMOTEROL TARTRATE 15 MCG/2ML IN NEBU
15.0000 ug | INHALATION_SOLUTION | Freq: Two times a day (BID) | RESPIRATORY_TRACT | Status: DC
  Administered 2022-05-23 – 2022-05-27 (×8): 15 ug via RESPIRATORY_TRACT
  Filled 2022-05-23 (×8): qty 2

## 2022-05-23 MED ORDER — NYSTATIN 100000 UNIT/GM EX POWD
Freq: Two times a day (BID) | CUTANEOUS | Status: DC
  Filled 2022-05-23: qty 15

## 2022-05-23 MED ORDER — METHYLPREDNISOLONE SODIUM SUCC 125 MG IJ SOLR
125.0000 mg | Freq: Once | INTRAMUSCULAR | Status: AC
  Administered 2022-05-23: 125 mg via INTRAVENOUS
  Filled 2022-05-23: qty 2

## 2022-05-23 MED ORDER — METHYLPREDNISOLONE SODIUM SUCC 40 MG IJ SOLR
40.0000 mg | Freq: Two times a day (BID) | INTRAMUSCULAR | Status: DC
  Administered 2022-05-24 – 2022-05-27 (×7): 40 mg via INTRAVENOUS
  Filled 2022-05-23 (×7): qty 1

## 2022-05-23 MED ORDER — MAGNESIUM SULFATE 2 GM/50ML IV SOLN
2.0000 g | Freq: Once | INTRAVENOUS | Status: AC
  Administered 2022-05-23: 2 g via INTRAVENOUS
  Filled 2022-05-23: qty 50

## 2022-05-23 NOTE — Progress Notes (Signed)
Heart Failure Navigator Progress Note  Assessed for Heart & Vascular TOC clinic readiness.  Patient from [facility], EF 55-60%, Interviewed for HF [facility] appointment due to readmission, patient reported he doesn't want to go anywhere new!, Patient is a patient at [facility] and has a scheduled follow up on 06/07/2022. Navigator attempted to educate patient on Heart failure and was told " he doesn't want to hear about it. "   Navigator will sign off at this time. Earnestine Leys, BSN, RN Heart Failure Transport planner Only

## 2022-05-23 NOTE — Progress Notes (Signed)
PROGRESS NOTE  Douglas Perkins. 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  Patient was hypoxic with significant work of breathing while in ED and started on BiPAP.  BNP elevated to 1662.  Troponin 81.  WBC 13.4.  VBG suggestive of metabolic alkalosis.  CXR with cardiomegaly, pulmonary vascular congestion and increased airspace opacities in the lungs bilaterally with improvement in RUL.  D-dimer slightly elevated to 1.38.  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  Blood cultures obtained.  Patient was started on IV Lasix, BiPAP and broad-spectrum antibiotics and admitted.  The next day, lower extremity venous Doppler negative for DVT.  CTA chest negative for PE but patchy bilateral airspace disease involving all lobes of both lungs suggesting multifocal pneumonia, small to moderate right-sided and small left pleural effusion.  Patient's breathing improved.  He was weaned to 4 L by nasal cannula.  Blood cultures NGTD.  No fever or leukocytosis.  Pro-Cal remains negative.  Active pneumonia felt to be less likely.   Antibiotics de-escalated to IV Ancef by ID to minimize fluid overload.  Remains on IV Lasix.  Consider increasing home diuretics on discharge.  Subjective: Seen and examined earlier this morning.  No major events overnight of this morning.  He is very sleepy and only oriented to self.  Follows commands.  He thinks he is in [place]  Objective: Vitals:   05/23/22 0828 05/23/22 0900 05/23/22 1152 05/23/22 1200  BP: 133/68 (!) 160/77 (!) 148/72   Pulse: 83 (!) 102 64   Resp: (!) 26 16 (!) 22   Temp: 98 F (36.7 C)  98 F (36.7 C)   TempSrc: Oral  Oral   SpO2: 98%  98% 90%  Weight:      Height:        Examination:  GENERAL: No apparent distress.  Nontoxic. HEENT: MMM.  Vision and hearing grossly intact.  NECK: Supple.  No apparent JVD.  RESP:  No IWOB.  Fair aeration bilaterally.  Rhonchi. CVS:  RRR. Heart sounds normal.  ABD/GI/GU: BS+. Abd soft, NTND.  MSK/EXT:  Moves extremities.  Has some degree of RLE atrophy. SKIN: no apparent skin lesion or wound NEURO: Sleepy but wakes to voice.  Oriented to self.  Follows commands.  PSYCH: Calm. Normal affect.   Procedures:  None  Microbiology summarized: TFTDD-22, influenza and RSV PCR nonreactive. Blood cultures NGTD  Assessment and plan: Principal Problem:   Acute on chronic respiratory failure with hypoxemia (HCC) Active Problems:   Benign essential hypertension   Hyperlipidemia, mixed   Bilateral pneumonia   Bipolar I disorder (HCC)   Coronary artery disease involving native coronary artery of native heart   Obesity (BMI 30-39.9)  Cardiac pacemaker   History of Merkel cell carcinoma   History of sinoatrial node dysfunction   OSA (obstructive sleep apnea)   Infection of pacemaker lead wire (HCC)   Bacteremia due to group B Streptococcus   Acute on chronic diastolic CHF (congestive heart failure) (HCC)   Physical deconditioning   Morbid obesity ([place])   Acute encephalopathy  Acute on chronic respiratory failure with  hypoxemia: Most likely due to CHF versus pneumonia.  BNP elevated to 1660 (higher than baseline).  CXR suggested CHF.  CT findings likely from recent pneumonia versus active infection.  CTA chest negative for PE.  Hospital from 1/11-1/18 for CHF and multifocal pneumonia.  At that time, he was diuresed with IV Lasix 60 mg twice daily and discharged on p.o. Lasix 20 mg daily and 4 L oxygen.  Started on IV Lasix.  About 3 L UOP/24 hours.  Renal function and BP stable.  -Continue IV Lasix 60 mg twice daily.  -Appreciate help by ID about changing antibiotics to oral to help with IV fluid load. -Strict intake and output, daily weight, renal functions and electrolytes -Wean off BiPAP as able.  Minimum oxygen to keep saturation above 88% despite home 4 L. -PT/OT-recommended SNF.  Acute on chronic diastolic CHF: Limited TTE on 1/12 with LVEF of 55 to 60%, indeterminate DD -Management as above.  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  Blood cultures NGTD.  Procalcitonin negative. -Antibiotics de-escalated to IV Ancef to minimize fluid overload  Multifocal opacity on CT chest: CTA chest negative for PE but concerning for multifocal opacity.  However, patient without fever, leukocytosis or sepsis physiology.  Pro-Cal negative.  Doubt active pneumonia or infection. -Antibiotics as above.  Acute metabolic encephalopathy: somnolent this morning.  He is only oriented to self.  He thinks he is in [place].  He is on significant dose of Seroquel.  Also at risk for CO2 retention -Wean oxygen as above. -Decrease Seroquel to 200 mg nightly -Check basic encephalopathy labs including VBG, TSH, ammonia, B12  OSA: Has implantable inspire device.  -Minimum oxygen to keep saturation above 88%.  Physical deconditioning -PT/OT  Bipolar 1 disorder: Stable. -Continue home Depakote  -Decrease Seroquel from 300 to 200  mg nightly in the setting of encephalopathy.  Elevated troponin/history of CAD: Likely demand ischemia. -Management as above.  Elevated D-dimer: D-dimer 1.38.  LE venous Doppler and CTA chest negative for VTE.  Essential hypertension: Elevated BP today. -Resume home Coreg. -Continue holding home lisinopril  BPH without LUTS -Monitor urine output -Continue on Flomax  Morbid obesity Body mass index is 33.52 kg/m.      DVT prophylaxis:  SCDs Start: 05/21/22 0426  Code Status: Full code Family Communication: None at bedside Level of care: Progressive Status is: Inpatient Remains inpatient appropriate because: Acute CHF and encephalopathy   Final disposition: SNF Consultants:  None  55 minutes with more than 50% spent in reviewing records, counseling patient/family and coordinating care.   Sch Meds:  Scheduled Meds:  aspirin EC  81 mg Oral Daily   atorvastatin  40 mg Oral Daily   carvedilol  10 mg Oral Daily   Chlorhexidine Gluconate Cloth  6 each Topical Daily   Chlorhexidine Gluconate Cloth  6 each Topical Q0600   divalproex  1,000 mg Oral QHS   dorzolamide  1 drop Right Eye BID   furosemide  60 mg Intravenous Q12H   gabapentin  300 mg Oral BID   Gerhardt's butt cream   Topical BID   lactulose  20 g Oral BID   latanoprost  1 drop Right Eye BID   melatonin  10 mg Oral QHS   mometasone-formoterol  2 puff Inhalation BID   mupirocin ointment  1 Application Nasal BID   pantoprazole  40 mg Oral BID   QUEtiapine  200 mg Oral QHS   sodium chloride flush  3 mL Intravenous Q12H   tamsulosin  0.4 mg Oral QHS   timolol  1 drop Right Eye BID   Continuous Infusions:  sodium chloride Stopped (05/21/22 2004)    ceFAZolin (ANCEF) IV 2 g (05/23/22 1446)   PRN Meds:.sodium chloride, acetaminophen, acetaminophen, ipratropium-albuterol, labetalol, ondansetron (ZOFRAN) IV, ondansetron (ZOFRAN) IV, mouth rinse, sodium chloride flush  Antimicrobials: Anti-infectives (From  admission, onward)    Start     Dose/Rate Route Frequency Ordered Stop   05/22/22 1800  penicillin G potassium 12 Million Units in dextrose 5 % 500 mL continuous infusion  Status:  Discontinued        12 Million Units 41.7 mL/hr over 12 Hours Intravenous Every 12 hours 05/22/22 1112 05/22/22 1328   05/22/22 1800  ceFAZolin (ANCEF) IVPB 2g/100 mL premix        2 g 200 mL/hr over 30 Minutes Intravenous Every 8 hours 05/22/22 1328     05/22/22 1200  penicillin G potassium 12 Million Units in dextrose 5 % 500 mL continuous infusion  Status:  Discontinued        12 Million Units 41.7 mL/hr over 12 Hours Intravenous Every 12 hours 05/22/22 1112 05/22/22 1112   05/21/22 1800  vancomycin (VANCOCIN) IVPB 1000 mg/200 mL premix  Status:  Discontinued        1,000 mg 200 mL/hr over 60 Minutes Intravenous Every 12 hours 05/21/22 0546 05/22/22 1109   05/21/22 0800  ceFEPIme (MAXIPIME) 2 g in sodium chloride 0.9 % 100 mL IVPB  Status:  Discontinued        2 g 200 mL/hr over 30 Minutes Intravenous Every 8 hours 05/21/22 0546 05/22/22 1109   05/21/22 0130  ceFEPIme (MAXIPIME) 2 g in sodium chloride 0.9 % 100 mL IVPB        2 g 200 mL/hr over 30 Minutes Intravenous  Once 05/21/22 0117 05/21/22 0346   05/21/22 0130  vancomycin (VANCOREADY) IVPB 2000 mg/400 mL        2,000 mg 200 mL/hr over 120 Minutes Intravenous  Once 05/21/22 0128 05/21/22 0814        I have personally reviewed the following labs and images: CBC: Recent Labs  Lab 05/17/22 0407 05/18/22 0437 05/20/22 2240 05/21/22 0010 05/21/22 0408 05/21/22 0543 05/22/22 0607 05/23/22 0515  WBC 10.4 9.7 13.4*  --   --  11.6* 10.6* 10.5  NEUTROABS 6.5 5.9 11.1*  --   --  8.1*  --   --   HGB 9.4* 9.5* 10.8* 10.9* 9.9* 9.0* 9.9* 10.1*  HCT 28.9* 29.0* 32.4* 32.0* 29.0* 28.6* 31.2* 31.3*  MCV 94.4 95.7 93.1  --   --  96.3 96.6 93.7  PLT 302 303 341  --   --  348 308 347   BMP &GFR Recent Labs  Lab 05/17/22 0407 05/18/22 0437  05/20/22 2240 05/21/22 0010 05/21/22 0408 05/21/22 0543 05/21/22 0830 05/22/22 0607 05/23/22 0515  NA 141 140 138 140 139  --  140 139 137  K 3.8 3.5 3.2* 3.4* 3.4*  --  3.2* 3.8 3.4*  CL 97* 95* 94*  --   --   --  97* 97* 94*  CO2 34* 39* 32  --   --   --  37* 33* 34*  GLUCOSE 117* 106* 168*  --   --   --  107* 135* 134*  BUN '15 16 9  '$ --   --   --  '12 13 11  '$ CREATININE 0.79 0.90 0.68  --   --  0.89 0.79 0.71 0.77  CALCIUM 8.8* 8.5* 8.5*  --   --   --  8.3* 8.4* 8.5*  MG 1.6*  --   --   --   --  1.8 1.8 1.8 1.7  PHOS  --   --   --   --   --   --  2.9 2.6 3.3   Estimated Creatinine Clearance: 98.4 mL/min (by C-G formula based on SCr of 0.77 mg/dL). Liver & Pancreas: Recent Labs  Lab 05/21/22 0830 05/22/22 0607 05/23/22 0515  ALBUMIN 2.1* 2.2* 2.3*   No results for input(s): "LIPASE", "AMYLASE" in the last 168 hours. Recent Labs  Lab 05/23/22 1158  AMMONIA 43*   Diabetic: No results for input(s): "HGBA1C" in the last 72 hours. No results for input(s): "GLUCAP" in the last 168 hours. Cardiac Enzymes: No results for input(s): "CKTOTAL", "CKMB", "CKMBINDEX", "TROPONINI" in the last 168 hours. No results for input(s): "PROBNP" in the last 8760 hours. Coagulation Profile: No results for input(s): "INR", "PROTIME" in the last 168 hours. Thyroid Function Tests: Recent Labs    05/23/22 1100  TSH 0.355   Lipid Profile: No results for input(s): "CHOL", "HDL", "LDLCALC", "TRIG", "CHOLHDL", "LDLDIRECT" in the last 72 hours. Anemia Panel: Recent Labs    05/23/22 1105  VITAMINB12 517   Urine analysis:    Component Value Date/Time   COLORURINE YELLOW 04/09/2022 2135   APPEARANCEUR HAZY (A) 04/09/2022 2135   APPEARANCEUR Clear 05/24/2015 1328   LABSPEC 1.017 04/09/2022 2135   PHURINE 6.0 04/09/2022 2135   GLUCOSEU NEGATIVE 04/09/2022 2135   HGBUR SMALL (A) 04/09/2022 2135   BILIRUBINUR NEGATIVE 04/09/2022 2135   BILIRUBINUR Negative 05/24/2015 [place] 04/09/2022 2135   PROTEINUR 30 (A) 04/09/2022 2135   NITRITE NEGATIVE 04/09/2022 2135   LEUKOCYTESUR NEGATIVE 04/09/2022 2135   Sepsis Labs: Invalid input(s): "PROCALCITONIN", "LACTICIDVEN"  Microbiology: Recent Results (from the past 240 hour(s))  Resp panel by RT-PCR (RSV, Flu A&B, Covid) Anterior Nasal Swab     Status: None   Collection Time: 05/20/22 10:08 PM   Specimen: Anterior Nasal Swab  Result Value Ref Range Status   SARS Coronavirus 2 by RT PCR NEGATIVE NEGATIVE Final    Comment: (NOTE) SARS-CoV-2 target nucleic acids are NOT DETECTED.  The SARS-CoV-2 RNA is generally detectable in upper respiratory specimens during the acute phase of infection. The lowest concentration of SARS-CoV-2 viral copies this assay can detect is 138 copies/mL. A negative result does not preclude SARS-Cov-2 infection and should not be used as the sole basis for treatment or other patient management decisions. A negative result may occur with  improper specimen collection/handling, submission of specimen other than nasopharyngeal swab, presence of viral mutation(s) within the areas targeted by this assay, and inadequate number of viral copies(<138 copies/mL). A negative result must be combined with clinical observations, patient history, and epidemiological information. The expected result is Negative.  Fact Sheet for Patients:  EntrepreneurPulse.com.au  Fact Sheet for Healthcare Providers:  IncredibleEmployment.be  This test is no t yet approved or cleared by the [place] and  has been authorized for detection and/or diagnosis of SARS-CoV-2 by FDA under an Emergency Use Authorization (EUA). This EUA will remain  in effect (meaning this test can be used) for the duration of the COVID-19 declaration under Section 564(b)(1) of the Act, 21 U.S.C.section 360bbb-3(b)(1), unless the authorization is terminated  or revoked sooner.        Influenza A by PCR NEGATIVE NEGATIVE Final   Influenza B by PCR NEGATIVE NEGATIVE Final    Comment: (NOTE) The Xpert Xpress SARS-CoV-2/FLU/RSV plus assay is intended as an aid in the diagnosis of influenza from Nasopharyngeal swab specimens and should not be used as a sole basis for treatment. Nasal washings and aspirates are unacceptable for Xpert Xpress SARS-CoV-2/FLU/RSV testing.  Fact Sheet for Patients: EntrepreneurPulse.com.au  Fact Sheet for Healthcare Providers: IncredibleEmployment.be  This test is not yet approved or cleared by the [place] FDA and has been authorized for detection and/or diagnosis of SARS-CoV-2 by FDA under an Emergency Use Authorization (EUA). This EUA will remain in effect (meaning this test can be used) for the duration of the COVID-19 declaration under Section 564(b)(1) of the Act, 21 U.S.C. section 360bbb-3(b)(1), unless the authorization is terminated or revoked.     Resp Syncytial Virus by PCR NEGATIVE NEGATIVE Final    Comment: (NOTE) Fact Sheet for Patients: EntrepreneurPulse.com.au  Fact Sheet for Healthcare Providers: IncredibleEmployment.be  This test is not yet approved or cleared by the [place] FDA and has been authorized for detection and/or diagnosis of SARS-CoV-2 by FDA under an Emergency Use Authorization (EUA). This EUA will remain in effect (meaning this test can be used) for the duration of the COVID-19 declaration under Section 564(b)(1) of the Act, 21 U.S.C. section 360bbb-3(b)(1), unless the authorization is terminated or revoked.  Performed at [facility], [place] [address], [place], [place] [address]   Culture, blood (Routine X 2) w Reflex to ID Panel     Status: None (Preliminary result)   Collection Time: 05/21/22  5:43 AM   Specimen: BLOOD RIGHT HAND  Result Value Ref Range Status   Specimen Description BLOOD RIGHT HAND  Final    Special Requests   Final    BOTTLES DRAWN AEROBIC AND ANAEROBIC Blood Culture adequate volume   Culture   Final    NO GROWTH 2 DAYS Performed at [facility], [place] [address], [place], [place] [address]    Report Status PENDING  Incomplete  Culture, blood (Routine X 2) w Reflex to ID Panel     Status: None (Preliminary result)   Collection Time: 05/21/22  5:44 AM   Specimen: BLOOD LEFT FOREARM  Result Value Ref Range Status   Specimen Description BLOOD LEFT FOREARM  Final   Special Requests   Final    BOTTLES DRAWN AEROBIC AND ANAEROBIC Blood Culture adequate volume   Culture   Final    NO GROWTH 2 DAYS Performed at [facility], [place] [address] [place], [place] [address]    Report Status PENDING  Incomplete  MRSA Next Gen by PCR, Nasal     Status: Abnormal   Collection Time: 05/21/22  6:49 PM   Specimen: Nasal Mucosa; Nasal Swab  Result Value Ref Range Status   MRSA by PCR Next Gen DETECTED (A) NOT DETECTED Final    Comment: RESULT CALLED TO, READ BACK BY AND VERIFIED WITH:  C/ MIKE N.,  RN 05/21/22 2050 A. LAFRANCE (NOTE) The GeneXpert MRSA Assay (FDA approved for NASAL specimens only), is one component of a comprehensive MRSA colonization surveillance program. It is not intended to diagnose MRSA infection nor to guide or monitor treatment for MRSA infections. Test performance is not FDA approved in patients less than 18 years old. Performed at [facility], [place] [address], [place], [place] [address]     Radiology Studies: No results found.    Josselyn Harkins T. Bandera  If 7PM-7AM, please contact night-coverage [email or web address] 05/23/2022, 3:05 PM

## 2022-05-23 NOTE — NC FL2 (Signed)
[place] LEVEL OF CARE FORM     IDENTIFICATION  Patient Name: Douglas Perkins. Birthdate: 02/08/1946 Sex: male Admission Date (Current Location): 05/20/2022  [facility] and [place] Number:  Herbalist and Address:  [facility] [place], [place] [address], [place], [place] [address]      Provider Number: [id number]  Attending Physician Name and Address:  Mercy Riding, MD  Relative Name and Phone Number:  [facility]) [phone]    Current Level of Care: Hospital Recommended Level of Care: [place] II Prior Approval Number:    Date Approved/Denied:   PASRR Number: PASRR under review  Discharge Plan: SNF    Current Diagnoses: Patient Active Problem List   Diagnosis Date Noted   Acute on chronic diastolic CHF (congestive heart failure) ([place]) 05/21/2022   Physical deconditioning 05/21/2022   Acute on chronic respiratory failure with hypoxemia ([place]) 05/21/2022   Morbid obesity ([place]) 05/21/2022   Multifocal pneumonia 05/11/2022   Heart failure with preserved ejection fraction ([place]) 05/11/2022   Leukocytosis 05/11/2022   Normocytic anemia 05/11/2022   Cough 05/10/2022   Infection of pacemaker lead wire ([place]) 04/12/2022   Bacteremia due to group B Streptococcus 04/12/2022   Sepsis ([place]) 04/10/2022   Prediabetes 03/06/2021   Bipolar affective disorder, current episode manic ([place]) 03/03/2021   Pressure injury of skin 83/72/9021   Acute metabolic encephalopathy 11/55/2080   Hyponatremia 02/18/2021   OSA (obstructive sleep apnea) 11/26/2019   Cardiac pacemaker 04/30/2019   History of sinoatrial node dysfunction 04/30/2019   Other male erectile dysfunction 11/12/2018   Near syncope 05/22/2017   Coronary artery disease involving native coronary artery of native heart 01/10/2017   Low serum testosterone level 06/16/2016   Obesity (BMI 30-39.9) 03/09/2016   Syncope 07/19/2015   SIRS (systemic inflammatory response  syndrome) ([place]) 07/07/2015   Bipolar I disorder ([place]) 06/24/2015   Bilateral pneumonia 06/23/2015   Bipolar affective disorder, currently manic, mild ([place]) 06/18/2015   History of duodenal ulcer 06/18/2015   Vaccine counseling 06/18/2015   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   Bradycardia 02/08/2015   Hyperlipidemia, mixed 10/14/2014   AF (amaurosis fugax) 09/16/2014   Benign essential hypertension 09/16/2014   Chest pain 09/16/2014   Skin cyst 08/06/2012   Personal history of lymphoma 08/06/2012   Cancer ([place])     Orientation RESPIRATION BLADDER Height & Weight     Self, Time, Situation, Place  Normal Incontinent, External catheter (External Urinary Catheter) Weight: 247 lb 2.2 oz (112.1 kg) Height:  6' (182.9 cm)  BEHAVIORAL SYMPTOMS/MOOD NEUROLOGICAL BOWEL NUTRITION STATUS      Continent Diet (Please see discharge summary)  AMBULATORY STATUS COMMUNICATION OF NEEDS Skin   Extensive Assist Verbally Other (Comment) (Appropriate for ethnicity,dry,Abrasion,arm,Bilateral,Ecchymosis,arm,Bilateral,Erythema,Leg,R,Wound/Incision LDAs,Wound/Incision open or dehiced Irritant Dermatitis (MASD), Groin,anterior,proximal,R,L)                       Personal Care Assistance Level of Assistance  Bathing, Feeding, Dressing Bathing Assistance: Maximum assistance Feeding assistance: Limited assistance Dressing Assistance: Maximum assistance     Functional Limitations Info  Sight, Hearing, Speech Sight Info: Impaired Hearing Info: Impaired Speech Info: Adequate    SPECIAL CARE FACTORS FREQUENCY  PT (By licensed PT), OT (By licensed OT)     PT Frequency: 5x min weekly OT Frequency: 5x min weekly            Contractures Contractures Info: Not present    Additional Factors  Info  Code Status, Allergies, Psychotropic Code Status Info: FULL Allergies Info: Sulfa Antibiotics,Feldene (piroxicam) Psychotropic Info: QUEtiapine (SEROQUEL) tablet 200 mg daily at bedtime          Current Medications (05/23/2022):  This is the current hospital active medication list Current Facility-Administered Medications  Medication Dose Route Frequency Provider Last Rate Last Admin   0.9 %  sodium chloride infusion  250 mL Intravenous PRN Quintella Baton, MD   Stopped at 05/21/22 2004   acetaminophen (TYLENOL) tablet 650 mg  650 mg Oral Q4H PRN Crosley, Debby, MD       acetaminophen (TYLENOL) tablet 650 mg  650 mg Oral Q4H PRN Crosley, Debby, MD   650 mg at 05/21/22 2300   aspirin EC tablet 81 mg  81 mg Oral Daily Gonfa, Taye T, MD   81 mg at 05/23/22 1040   atorvastatin (LIPITOR) tablet 40 mg  40 mg Oral Daily Gonfa, Taye T, MD   40 mg at 05/23/22 1026   carvedilol (COREG CR) 24 hr capsule 10 mg  10 mg Oral Daily Wendee Beavers T, MD   10 mg at 05/23/22 1109   ceFAZolin (ANCEF) IVPB 2g/100 mL premix  2 g Intravenous Q8H Vu, Trung T, MD 200 mL/hr at 05/23/22 0538 2 g at 05/23/22 0538   Chlorhexidine Gluconate Cloth 2 % PADS 6 each  6 each Topical Daily Quintella Baton, MD   6 each at 05/23/22 1200   Chlorhexidine Gluconate Cloth 2 % PADS 6 each  6 each Topical Q0600 Wendee Beavers T, MD   6 each at 05/23/22 0600   divalproex (DEPAKOTE) DR tablet 1,000 mg  1,000 mg Oral QHS Gonfa, Taye T, MD   1,000 mg at 05/23/22 0041   dorzolamide (TRUSOPT) 2 % ophthalmic solution 1 drop  1 drop Right Eye BID Wendee Beavers T, MD   1 drop at 05/23/22 1030   furosemide (LASIX) injection 60 mg  60 mg Intravenous Q12H Gonfa, Taye T, MD   60 mg at 05/23/22 1026   gabapentin (NEURONTIN) capsule 300 mg  300 mg Oral BID Wendee Beavers T, MD   300 mg at 05/23/22 1026   Gerhardt's butt cream   Topical BID Wendee Beavers T, MD   Given at 05/23/22 1041   ipratropium-albuterol (DUONEB) 0.5-2.5 (3) MG/3ML nebulizer solution 3 mL  3 mL Nebulization Q4H PRN Crosley, Debby, MD       labetalol (NORMODYNE) injection 10 mg  10 mg Intravenous Q2H PRN Gonfa, Taye T, MD       latanoprost (XALATAN) 0.005 % ophthalmic solution 1 drop  1  drop Right Eye BID Wendee Beavers T, MD   1 drop at 05/23/22 1029   melatonin tablet 10 mg  10 mg Oral QHS Wendee Beavers T, MD   10 mg at 05/23/22 0038   mometasone-formoterol (DULERA) 100-5 MCG/ACT inhaler 2 puff  2 puff Inhalation BID Wendee Beavers T, MD   2 puff at 05/23/22 1028   mupirocin ointment (BACTROBAN) 2 % 1 Application  1 Application Nasal BID Wendee Beavers T, MD   1 Application at 32/44/01 1028   ondansetron (ZOFRAN) injection 4 mg  4 mg Intravenous Q6H PRN Crosley, Debby, MD       ondansetron (ZOFRAN) injection 4 mg  4 mg Intravenous Q6H PRN Crosley, Debby, MD       Oral care mouth rinse  15 mL Mouth Rinse PRN Cyndia Skeeters, Charlesetta Ivory, MD       pantoprazole (PROTONIX) EC  tablet 40 mg  40 mg Oral BID Wendee Beavers T, MD   40 mg at 05/23/22 1027   potassium chloride SA (KLOR-CON M) CR tablet 40 mEq  40 mEq Oral Q4H Gonfa, Taye T, MD   40 mEq at 05/23/22 1027   QUEtiapine (SEROQUEL) tablet 200 mg  200 mg Oral QHS Gonfa, Taye T, MD       sodium chloride flush (NS) 0.9 % injection 3 mL  3 mL Intravenous Q12H Crosley, Debby, MD   3 mL at 05/23/22 1041   sodium chloride flush (NS) 0.9 % injection 3 mL  3 mL Intravenous PRN Crosley, Debby, MD       tamsulosin (FLOMAX) capsule 0.4 mg  0.4 mg Oral QHS Gonfa, Taye T, MD   0.4 mg at 05/23/22 0039   timolol (TIMOPTIC) 0.5 % ophthalmic solution 1 drop  1 drop Right Eye BID Mercy Riding, MD   1 drop at 05/23/22 1029     Discharge Medications: Please see discharge summary for a list of discharge medications.  Relevant Imaging Results:  Relevant Lab Results:   Additional Information [id number]  Milas Gain, LCSWA

## 2022-05-23 NOTE — Progress Notes (Signed)
Notified by patient's RN earlier that patient had some work of breathing and tachypnea.  Patient is awake and alert.  He is oriented to self and time but not place.  Patient denies shortness of breath.  Coughing frequently.  Cough seems to be productive.  Patient denies shortness of breath or chest pain.  Reports feeling "hot".  VBG from earlier this morning 7.41/63/46/40 suggesting compensated respiratory stenosis.  Ammonia slightly elevated to 43.  B12 and TSH was normal.  On exam, Somewhat restless.   Saturating in mid 90s on 2 L.  Rhonchorous.  Coughing frequently.   Tachycardic with elevated blood pressure but in the setting of restlessness.  Awake and alert.  Oriented fairly.  No facial asymmetry or focal weakness.  Assessment and plan Respiratory distress/restlessness/confusion: denies respiratory distress but tachypneic, coughing and restless.  Very rhonchorous on exam.  However, improved oxygen requirement.  -Reorientation and delirium precautions. -CXR, Depakote level, LFT -Continue IV diuretics -Add Solu-Medrol.  Change inhalers to nebulizers. -Have decreased Seroquel from 300 mg to 200 mg daily -Start IV Solu-Medrol -BiPAP as needed -Continue minimum oxygen to keep saturation above 90%

## 2022-05-23 NOTE — Progress Notes (Signed)
Mobility Specialist - Progress Note   05/23/22 1138  Mobility  Activity Dangled on edge of bed  Level of Assistance Moderate assist, patient does 50-74%  Assistive Device None  Activity Response Tolerated poorly  Mobility Referral Yes  $Mobility charge 1 Mobility   Pt was received in bed and agreeable to session. Pt was ModA to EOB. Once EOB pt c/o being EOB and stated that he was crippled and unable to mobilize. Pt was returned to bed with all needs met.   Franki Monte  Mobility Specialist Please contact via Solicitor or Rehab office at [phone]

## 2022-05-23 NOTE — Progress Notes (Addendum)
RE: Douglas Perkins  Date of Birth: [birth year]  Date: 05/23/2022  To Whom It May Concern:  Please be advised that the above-named patient will require a short-term nursing home stay - anticipated 30 days or less for rehabilitation and strengthening. The plan is for return home.

## 2022-05-23 NOTE — TOC Progression Note (Addendum)
Transition of Care (TOC) - Progression Note    Patient Details  Name: Douglas Perkins. MRN: [id number] Date of Birth: Dec 27, 1945  Transition of Care [facility]) CM/SW [place], [place] Phone Number: 05/23/2022, 1:50 PM  Clinical Narrative:     CSW spoke with patient at bedside. Patient reports PTA he was at [facility] short term. Patient confirmed is plan is to return for short term rehab when medically ready for dc. CSW spoke with Star with [facility] who confirmed he can return back for short term rehab when medically ready for dc. Patients passr currently pending. CSW submitted requested clinicals to [place] must for review. CSW awaiting passr determination. CSW will continue to follow and assist with patients dc planning needs.        Expected Discharge Plan and Services                                               Social Determinants of Health (SDOH) Interventions SDOH Screenings   Food Insecurity: No Food Insecurity (05/22/2022)  Housing: Low Risk  (05/22/2022)  Transportation Needs: No Transportation Needs (05/22/2022)  Utilities: Not At Risk (05/22/2022)  Alcohol Screen: Low Risk  (03/03/2021)  Tobacco Use: Low Risk  (05/20/2022)    Readmission Risk Interventions    05/17/2022    2:11 PM  Readmission Risk Prevention Plan  Transportation Screening Complete  Medication Review (RN Care Manager) Complete  PCP or Specialist appointment within 3-5 days of discharge Complete  HRI or Home Care Consult Complete  SW Recovery Care/Counseling Consult Complete  Palliative Care Screening Not [place] Complete

## 2022-05-24 DIAGNOSIS — J9621 Acute and chronic respiratory failure with hypoxia: Secondary | ICD-10-CM | POA: Diagnosis not present

## 2022-05-24 LAB — RENAL FUNCTION PANEL
Albumin: 2.2 g/dL — ABNORMAL LOW (ref 3.5–5.0)
Anion gap: 8 (ref 5–15)
BUN: 18 mg/dL (ref 8–23)
CO2: 37 mmol/L — ABNORMAL HIGH (ref 22–32)
Calcium: 8.1 mg/dL — ABNORMAL LOW (ref 8.9–10.3)
Chloride: 93 mmol/L — ABNORMAL LOW (ref 98–111)
Creatinine, Ser: 0.92 mg/dL (ref 0.61–1.24)
GFR, Estimated: >60 mL/min (ref >60)
Glucose, Bld: 154 mg/dL — ABNORMAL HIGH (ref 70–99)
Phosphorus: 4.8 mg/dL — ABNORMAL HIGH (ref 2.5–4.6)
Potassium: 4.1 mmol/L (ref 3.5–5.1)
Sodium: 138 mmol/L (ref 135–145)

## 2022-05-24 LAB — CBC
HCT: 31 % — ABNORMAL LOW (ref 39.0–52.0)
Hemoglobin: 10 g/dL — ABNORMAL LOW (ref 13.0–17.0)
MCH: 30.6 pg (ref 26.0–34.0)
MCHC: 32.3 g/dL (ref 30.0–36.0)
MCV: 94.8 fL (ref 80.0–100.0)
Platelets: 325 10*3/uL (ref 150–400)
RBC: 3.27 MIL/uL — ABNORMAL LOW (ref 4.22–5.81)
RDW: 13.3 % (ref 11.5–15.5)
WBC: 6.1 10*3/uL (ref 4.0–10.5)
nRBC: 0 % (ref 0.0–0.2)

## 2022-05-24 LAB — AMMONIA: Ammonia: 37 umol/L — ABNORMAL HIGH (ref 9–35)

## 2022-05-24 LAB — GLUCOSE, CAPILLARY
Glucose-Capillary: 133 mg/dL — ABNORMAL HIGH (ref 70–99)
Glucose-Capillary: 133 mg/dL — ABNORMAL HIGH (ref 70–99)
Glucose-Capillary: 147 mg/dL — ABNORMAL HIGH (ref 70–99)

## 2022-05-24 LAB — BRAIN NATRIURETIC PEPTIDE: B Natriuretic Peptide: 1519.5 pg/mL — ABNORMAL HIGH (ref 0.0–100.0)

## 2022-05-24 LAB — MAGNESIUM: Magnesium: 2 mg/dL (ref 1.7–2.4)

## 2022-05-24 NOTE — TOC Progression Note (Signed)
Transition of Care (TOC) - Progression Note    Patient Details  Name: Douglas Perkins. MRN: [id number] Date of Birth: 15-Jul-1945  Transition of Care [facility]) CM/SW [place], [place] Phone Number: 05/24/2022, 2:12 PM  Clinical Narrative:     Patients Passr was approved. Patient has SNF bed at [facility] when medically ready. CSW will continue to follow and assist with patients dc planning needs.       Expected Discharge Plan and Services                                               Social Determinants of Health (SDOH) Interventions SDOH Screenings   Food Insecurity: No Food Insecurity (05/22/2022)  Housing: Low Risk  (05/22/2022)  Transportation Needs: No Transportation Needs (05/22/2022)  Utilities: Not At Risk (05/22/2022)  Alcohol Screen: Low Risk  (03/03/2021)  Financial Resource Strain: Low Risk  (05/23/2022)  Tobacco Use: Low Risk  (05/20/2022)    Readmission Risk Interventions    05/17/2022    2:11 PM  Readmission Risk Prevention Plan  Transportation Screening Complete  Medication Review (RN Care Manager) Complete  PCP or Specialist appointment within 3-5 days of discharge Complete  HRI or [place] Complete  SW Recovery Care/Counseling Consult Complete  Palliative Care Screening Not [place] Complete

## 2022-05-24 NOTE — Evaluation (Signed)
Clinical/Bedside Swallow Evaluation Patient Details  Name: Douglas Perkins. 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    Presence of permanent cardiac pacemaker 2017   SA node dysfunction   Sleep apnea    does not use CPAP   TIA (transient ischemic attack)    Past Surgical History:  Past Surgical History:  Procedure Laterality Date   BUBBLE STUDY  04/14/2022   Procedure: BUBBLE STUDY;  Surgeon: Lelon Perla, MD;  Location: [place];  Service: Cardiovascular;;   CHOLECYSTECTOMY     COLONOSCOPY WITH PROPOFOL N/A 01/31/2017   Procedure: COLONOSCOPY WITH PROPOFOL;  Surgeon: Manya Silvas, MD;  Location: [facility] ENDOSCOPY;  Service: Endoscopy;  Laterality: N/A;   DRUG INDUCED ENDOSCOPY N/A 05/12/2020   Procedure: DRUG INDUCED ENDOSCOPY;  Surgeon: Melida Quitter, MD;  Location: [place];  Service: ENT;  Laterality: N/A;   IMPLANTATION OF HYPOGLOSSAL NERVE STIMULATOR N/A 07/21/2020   Procedure: IMPLANTATION OF HYPOGLOSSAL NERVE STIMULATOR;  Surgeon: Melida Quitter, MD;  Location: [place];  Service: ENT;  Laterality: N/A;   LEAD EXTRACTION N/A 04/17/2022   Procedure: LEAD EXTRACTION;  Surgeon: Vickie Epley, MD;  Location: [place] CV LAB;  Service: Cardiovascular;  Laterality: N/A;   LEG SURGERY Left    distal   Pace maker placement     PROSTATE SURGERY     PROSTATECTOMY     SKIN CANCER EXCISION  2006,2007   Merkle Cell Carcinoma   TEE WITHOUT CARDIOVERSION N/A 04/14/2022   Procedure: TRANSESOPHAGEAL ECHOCARDIOGRAM (TEE);  Surgeon: Lelon Perla, MD;  Location: [facility] ENDOSCOPY;   Service: Cardiovascular;  Laterality: N/A;   HPI:  Pt is a 77 y/o M admitted on 1/20 for SOB. 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 Pt on 3L [place] at baseline. Presently has B PNA, and acute respiratory failure. Cleared for PE.  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  MBS in January 2024 "Pts overall pharyngeal function is relatively intact but as he starts to become more short of breath and more impulsive, his swallow becomes less organized and timely. This leads to moments of aspiration with thin liquids (PAS 7), which are not eliminated by use of a chin tuck (PAS 8). When physically assisted to take single sips he has improving airway protection (PAS 2), but he can protect his airway well with nectar thick liquids (PAS 2) regardless of any pacing or external assist.    Assessment / Plan / Recommendation  Clinical Impression  Pt demonstrates immediate very hard volitional cough with expectoration and spitting into a cup after initial sip of water. Futher trials did not elicit cough. Pt consistently took small single sips as recommended in prior MBS which did show a respiratory based dysphagia. Today work of breathing seems fairly manageable. Expect that pt likely has instances of penetration or aspiration as respiratory function fluctuates and it will be hard to prevent this without long term use  of thickened liquids which would not be recommended. Severity of current impairment is unclear. Recommend f/u with observation at meal. Unsure if repeat MBS will elucidate function further. May need to reinforce precautions and pt understanding of swallowing safety while admitted. SLP Visit Diagnosis: Dysphagia, oropharyngeal phase (R13.12)    Aspiration Risk  Mild aspiration risk    Diet Recommendation Regular;Thin liquid   Liquid Administration via: Straw;Cup Medication  Administration: Whole meds with liquid Supervision: Patient able to self feed Compensations: Slow rate;Small sips/bites;Other (Comment);Clear throat intermittently Postural Changes: Seated upright at 90 degrees    Other  Recommendations Oral Care Recommendations: Oral care BID    Recommendations for follow up therapy are one component of a multi-disciplinary discharge planning process, led by the attending physician.  Recommendations may be updated based on patient status, additional functional criteria and insurance authorization.  Follow up Recommendations Skilled nursing-short term rehab (<3 hours/day)      Assistance Recommended at Discharge    Functional Status Assessment Patient has had a recent decline in their functional status and demonstrates the ability to make significant improvements in function in a reasonable and predictable amount of time.  Frequency and Duration min 2x/week  2 weeks       Prognosis        Swallow Study   General HPI: Pt is a 78 y/o M admitted on 1/20 for SOB. 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 Pt on 3L [place] at baseline. Presently has B PNA, and acute respiratory failure. Cleared for PE.  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  MBS in January 2024 "Pts overall pharyngeal function is relatively intact but as he starts to become more short of breath and more impulsive, his swallow becomes less organized and timely. This leads to moments of aspiration with thin liquids (PAS 7), which are not eliminated by use of a chin tuck (PAS 8). When physically assisted to take single sips he has improving airway protection (PAS 2), but he can protect his airway well with nectar thick liquids (PAS 2) regardless of any pacing or external assist. Type of Study: Bedside Swallow Evaluation Diet Prior to this Study: Regular;Thin  liquids Temperature Spikes Noted: No Respiratory Status: Nasal cannula History of Recent Intubation: No Behavior/Cognition: Alert;Cooperative;Pleasant mood Oral Cavity Assessment: Within Functional Limits Oral Care Completed by SLP: No Oral Cavity - Dentition: Adequate natural dentition Vision: Functional for self-feeding Self-Feeding Abilities: Able to feed self Patient Positioning: Upright in bed Baseline Vocal Quality: Normal Volitional Cough: Strong Volitional Swallow: Able to elicit    Oral/Motor/Sensory Function Overall Oral Motor/Sensory Function: Within functional limits   Ice Chips     Thin Liquid Thin Liquid: Impaired Presentation: Straw Pharyngeal  Phase Impairments: Cough - Immediate    Nectar Thick Nectar Thick Liquid: Not tested   Honey Thick Honey Thick Liquid: Not tested   Puree Puree: Within functional limits Presentation: Self Fed   Solid     Solid: Within functional limits      Renaud Celli, Katherene Ponto 05/24/2022,1:30 PM

## 2022-05-24 NOTE — Progress Notes (Signed)
PROGRESS NOTE    Douglas Perkins.  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  Patient was hypoxic with significant work of breathing while in ED and started on BiPAP.  BNP elevated to 1662.  Troponin 81.  WBC 13.4.  VBG suggestive of metabolic alkalosis.  CXR with cardiomegaly, pulmonary vascular congestion and increased airspace opacities in the lungs bilaterally with improvement in RUL.  D-dimer slightly elevated to 1.38.  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  Blood cultures obtained.  Patient was started on IV Lasix, BiPAP and broad-spectrum antibiotics and admitted.   The next day, lower extremity venous Doppler negative for DVT.  CTA chest negative for PE but patchy bilateral airspace disease involving all lobes of both lungs suggesting multifocal pneumonia, small to moderate right-sided and small left pleural effusion.   Patient's breathing improved.  He was weaned to 4 L by nasal cannula.  Blood cultures NGTD.  No fever or leukocytosis.  Pro-Cal remains negative.  Active pneumonia felt to be less likely.  Antibiotics de-escalated to IV Ancef by ID to minimize fluid overload.   Remains on IV Lasix.  Consider increasing home diuretics on discharge.  Assessment & Plan:   Principal Problem:   Acute on chronic respiratory failure with hypoxemia (HCC) Active Problems:   Benign essential hypertension   Hyperlipidemia, mixed   Bilateral pneumonia   Bipolar I disorder (HCC)   Coronary artery disease involving native coronary artery of native heart   Obesity (BMI 30-39.9)   Cardiac pacemaker   History of Merkel cell carcinoma   History of sinoatrial node dysfunction   OSA (obstructive sleep apnea)   Infection of pacemaker lead wire (HCC)   Bacteremia due to group B Streptococcus   Acute on chronic diastolic CHF (congestive heart failure) (HCC)   Physical deconditioning   Morbid obesity ([place])   Acute encephalopathy  Acute on chronic respiratory failure with hypoxemia: on 3 L on discharge 1/18.  Most likely due to CHF versus pneumonia.  BNP elevated to 1660 (higher than baseline).  CXR suggested CHF.  CT findings likely from recent pneumonia versus active infection.  CTA chest negative for PE.  Hospital from 1/11-1/18 for CHF and multifocal pneumonia.  At that time, he was diuresed with IV Lasix 60 mg twice daily and discharged on p.o. Lasix 20 mg daily and 4 L oxygen.  Continue lasix.  Renal function and BP stable.  -Continue IV Lasix 60 mg twice daily.  -Appreciate help by ID (ok with transitioning to cefazolin or linezolid - see 1/22 note from Dr. Gale Journey) -Strict intake and output, daily weight, renal functions and electrolytes -Wean off BiPAP as able.  Minimum oxygen to keep saturation above 88% despite home 4 L. -PT/OT-recommended SNF.   COPD Exacerbation -  continue steroids  Acute on chronic diastolic CHF: Limited TTE on 1/12 with LVEF of 55 to 60%, indeterminate DD -Management as above.   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  Blood cultures NGTD.   Procalcitonin negative. -Antibiotics de-escalated to IV Ancef to minimize fluid overload   Multifocal opacity on CT chest: CTA chest negative for PE but concerning for multifocal opacity.  However, patient without fever, leukocytosis or sepsis physiology.  Pro-Cal negative.  Doubt active pneumonia or infection. -Antibiotics as above.   Acute metabolic encephalopathy: somnolent this 1/23.  Improved 1/24 AM.    He is on significant dose of Seroquel.  Also at risk for CO2 retention -Wean oxygen as above. -Decrease Seroquel to 200 mg nightly -Check basic encephalopathy labs including VBG (elevated PCO2, but normal pH), TSH (wnl), ammonia, B12 (wnl)  OSA: Has implantable inspire device.  -Minimum oxygen to keep saturation above 88%.   Physical deconditioning -PT/OT   Bipolar 1 disorder: Stable. -Continue home Depakote  -Decrease Seroquel from 300 to 200 mg nightly in the setting of encephalopathy.   Elevated troponin/history of CAD: Likely demand ischemia. -Management as above.   Elevated D-dimer: D-dimer 1.38.  LE venous Doppler and CTA chest negative for VTE.   Essential hypertension: Elevated BP today. -Resume home Coreg. -Continue holding home lisinopril   BPH without LUTS -Monitor urine output -Continue on Flomax   Morbid obesity Body mass index is 32.29 kg/m.     DVT prophylaxis: SCD Code Status: full Family Communication: none Disposition:   Status is: Inpatient Remains inpatient appropriate because: continued O2 need, continued resp failure   Consultants:  none  Procedures:  LE [place] Summary:  BILATERAL:  - No evidence of deep vein thrombosis seen in the lower extremities,  bilaterally.  -No evidence of popliteal cyst, bilaterally.   Echo IMPRESSIONS     1. Poor quality study. Overall, LVEF appears normal but cannot assess for  wall motion. Limited evaluation of valvular structures due to poor  windows. Suggest TEE if there clinical concerns for  endocarditis.   2. Left ventricular ejection fraction, by estimation, is 55 to 60%. The  left ventricle has normal function. Left ventricular endocardial border  not optimally defined to evaluate regional wall motion. Indeterminate  diastolic filling due to E-Zinnia Tindall fusion.   3. Right ventricular systolic function was not well visualized. The right  ventricular size is not well visualized.   4. The mitral valve was not well visualized.   5. The aortic valve was not well visualized.   6. The inferior vena cava is dilated in size with <50% respiratory  variability, suggesting right atrial pressure of 15 mmHg.   Antimicrobials:  Anti-infectives (From admission, onward)    Start     Dose/Rate Route Frequency Ordered Stop   05/22/22 1800  penicillin G potassium 12 Million Units in dextrose 5 % 500 mL continuous infusion  Status:  Discontinued        12 Million Units 41.7 mL/hr over 12 Hours Intravenous Every 12 hours 05/22/22 1112 05/22/22 1328   05/22/22 1800  ceFAZolin (ANCEF) IVPB 2g/100 mL premix        2 g 200 mL/hr over 30 Minutes Intravenous Every 8 hours 05/22/22 1328     05/22/22 1200  penicillin G potassium 12 Million Units in dextrose 5 % 500 mL continuous infusion  Status:  Discontinued  12 Million Units 41.7 mL/hr over 12 Hours Intravenous Every 12 hours 05/22/22 1112 05/22/22 1112   05/21/22 1800  vancomycin (VANCOCIN) IVPB 1000 mg/200 mL premix  Status:  Discontinued        1,000 mg 200 mL/hr over 60 Minutes Intravenous Every 12 hours 05/21/22 0546 05/22/22 1109   05/21/22 0800  ceFEPIme (MAXIPIME) 2 g in sodium chloride 0.9 % 100 mL IVPB  Status:  Discontinued        2 g 200 mL/hr over 30 Minutes Intravenous Every 8 hours 05/21/22 0546 05/22/22 1109   05/21/22 0130  ceFEPIme (MAXIPIME) 2 g in sodium chloride 0.9 % 100 mL IVPB        2 g 200 mL/hr over 30 Minutes Intravenous  Once 05/21/22 0117 05/21/22 0346   05/21/22 0130  vancomycin (VANCOREADY) IVPB 2000 mg/400 mL         2,000 mg 200 mL/hr over 120 Minutes Intravenous  Once 05/21/22 0128 05/21/22 0814       Subjective: No new complaints  Objective: Vitals:   05/24/22 0311 05/24/22 0737 05/24/22 1100 05/24/22 1550  BP: (!) 112/56 105/78 (!) 115/98 115/64  Pulse: 69 78 75 66  Resp: (!) '22 18 19 18  '$ Temp: 98.3 F (36.8 C) 97.8 F (36.6 C) 98 F (36.7 C) 98.2 F (36.8 C)  TempSrc: Oral Oral Axillary Oral  SpO2: 98% 95% 95% 96%  Weight: 108 kg     Height:        Intake/Output Summary (Last 24 hours) at 05/24/2022 1813 Last data filed at 05/24/2022 1700 Gross per 24 hour  Intake --  Output 2620 ml  Net -2620 ml   Filed Weights   05/22/22 0432 05/23/22 0602 05/24/22 0311  Weight: 111.1 kg 112.1 kg 108 kg    Examination:  General exam: Appears calm and comfortable  Respiratory system: scattered diffuse anterior wheezing Cardiovascular system: RRR Gastrointestinal system: Abdomen is nondistended, soft and nontender.  Central nervous system: Alert and oriented. No focal neurological deficits. Extremities: no LEE   Data Reviewed: I have personally reviewed following labs and imaging studies  CBC: Recent Labs  Lab 05/18/22 0437 05/20/22 2240 05/21/22 0010 05/21/22 0408 05/21/22 0543 05/22/22 0607 05/23/22 0515 05/24/22 0550  WBC 9.7 13.4*  --   --  11.6* 10.6* 10.5 6.1  NEUTROABS 5.9 11.1*  --   --  8.1*  --   --   --   HGB 9.5* 10.8*   < > 9.9* 9.0* 9.9* 10.1* 10.0*  HCT 29.0* 32.4*   < > 29.0* 28.6* 31.2* 31.3* 31.0*  MCV 95.7 93.1  --   --  96.3 96.6 93.7 94.8  PLT 303 341  --   --  348 308 347 325   < > = values in this interval not displayed.    Basic Metabolic Panel: Recent Labs  Lab 05/20/22 2240 05/21/22 0010 05/21/22 0408 05/21/22 0543 05/21/22 0830 05/22/22 0607 05/23/22 0515 05/24/22 0550  NA 138   < > 139  --  140 139 137 138  K 3.2*   < > 3.4*  --  3.2* 3.8 3.4* 4.1  CL 94*  --   --   --  97* 97* 94* 93*  CO2 32  --   --   --  37* 33* 34* 37*   GLUCOSE 168*  --   --   --  107* 135* 134* 154*  BUN 9  --   --   --  $'12 13 11 18  'S$ CREATININE 0.68  --   --  0.89 0.79 0.71 0.77 0.92  CALCIUM 8.5*  --   --   --  8.3* 8.4* 8.5* 8.1*  MG  --   --   --  1.8 1.8 1.8 1.7 2.0  PHOS  --   --   --   --  2.9 2.6 3.3 4.8*   < > = values in this interval not displayed.    GFR: Estimated Creatinine Clearance: 84.1 mL/min (by C-G formula based on SCr of 0.92 mg/dL).  Liver Function Tests: Recent Labs  Lab 05/21/22 0830 05/22/22 0607 05/23/22 0515 05/23/22 1802 05/24/22 0550  AST  --   --   --  52*  --   ALT  --   --   --  70*  --   ALKPHOS  --   --   --  57  --   BILITOT  --   --   --  0.4  --   PROT  --   --   --  7.2  --   ALBUMIN 2.1* 2.2* 2.3* 2.6* 2.2*    CBG: Recent Labs  Lab 05/24/22 0800 05/24/22 1546  GLUCAP 133* 133*     Recent Results (from the past 240 hour(s))  Resp panel by RT-PCR (RSV, Flu Taksh Hjort&B, Covid) Anterior Nasal Swab     Status: None   Collection Time: 05/20/22 10:08 PM   Specimen: Anterior Nasal Swab  Result Value Ref Range Status   SARS Coronavirus 2 by RT PCR NEGATIVE NEGATIVE Final    Comment: (NOTE) SARS-CoV-2 target nucleic acids are NOT DETECTED.  The SARS-CoV-2 RNA is generally detectable in upper respiratory specimens during the acute phase of infection. The lowest concentration of SARS-CoV-2 viral copies this assay can detect is 138 copies/mL. Xareni Kelch negative result does not preclude SARS-Cov-2 infection and should not be used as the sole basis for treatment or other patient management decisions. Ifeanyi Mickelson negative result may occur with  improper specimen collection/handling, submission of specimen other than nasopharyngeal swab, presence of viral mutation(s) within the areas targeted by this assay, and inadequate number of viral copies(<138 copies/mL). May Ozment negative result must be combined with clinical observations, patient history, and epidemiological information. The expected result is  Negative.  Fact Sheet for Patients:  EntrepreneurPulse.com.au  Fact Sheet for Healthcare Providers:  IncredibleEmployment.be  This test is no t yet approved or cleared by the [place] FDA and  has been authorized for detection and/or diagnosis of SARS-CoV-2 by FDA under an Emergency Use Authorization (EUA). This EUA will remain  in effect (meaning this test can be used) for the duration of the COVID-19 declaration under Section 564(b)(1) of the Act, 21 U.S.C.section 360bbb-3(b)(1), unless the authorization is terminated  or revoked sooner.       Influenza Rama Sorci by PCR NEGATIVE NEGATIVE Final   Influenza B by PCR NEGATIVE NEGATIVE Final    Comment: (NOTE) The Xpert Xpress SARS-CoV-2/FLU/RSV plus assay is intended as an aid in the diagnosis of influenza from Nasopharyngeal swab specimens and should not be used as Ahmani Daoud sole basis for treatment. Nasal washings and aspirates are unacceptable for Xpert Xpress SARS-CoV-2/FLU/RSV testing.  Fact Sheet for Patients: EntrepreneurPulse.com.au  Fact Sheet for Healthcare Providers: IncredibleEmployment.be  This test is not yet approved or cleared by the [place] FDA and has been authorized for detection and/or diagnosis of SARS-CoV-2 by FDA under an Emergency Use Authorization (EUA). This EUA will remain in  effect (meaning this test can be used) for the duration of the COVID-19 declaration under Section 564(b)(1) of the Act, 21 U.S.C. section 360bbb-3(b)(1), unless the authorization is terminated or revoked.     Resp Syncytial Virus by PCR NEGATIVE NEGATIVE Final    Comment: (NOTE) Fact Sheet for Patients: EntrepreneurPulse.com.au  Fact Sheet for Healthcare Providers: IncredibleEmployment.be  This test is not yet approved or cleared by the [place] FDA and has been authorized for detection and/or diagnosis of  SARS-CoV-2 by FDA under an Emergency Use Authorization (EUA). This EUA will remain in effect (meaning this test can be used) for the duration of the COVID-19 declaration under Section 564(b)(1) of the Act, 21 U.S.C. section 360bbb-3(b)(1), unless the authorization is terminated or revoked.  Performed at [facility], [place] [address], [place], [place] [address]   Culture, blood (Routine X 2) w Reflex to ID Panel     Status: None (Preliminary result)   Collection Time: 05/21/22  5:43 AM   Specimen: BLOOD RIGHT HAND  Result Value Ref Range Status   Specimen Description BLOOD RIGHT HAND  Final   Special Requests   Final    BOTTLES DRAWN AEROBIC AND ANAEROBIC Blood Culture adequate volume   Culture   Final    NO GROWTH 3 DAYS Performed at [facility], [place] [address], [place], [place] [address]    Report Status PENDING  Incomplete  Culture, blood (Routine X 2) w Reflex to ID Panel     Status: None (Preliminary result)   Collection Time: 05/21/22  5:44 AM   Specimen: BLOOD LEFT FOREARM  Result Value Ref Range Status   Specimen Description BLOOD LEFT FOREARM  Final   Special Requests   Final    BOTTLES DRAWN AEROBIC AND ANAEROBIC Blood Culture adequate volume   Culture   Final    NO GROWTH 3 DAYS Performed at [facility], [place] [address] [place], [place] [address]    Report Status PENDING  Incomplete  MRSA Next Gen by PCR, Nasal     Status: Abnormal   Collection Time: 05/21/22  6:49 PM   Specimen: Nasal Mucosa; Nasal Swab  Result Value Ref Range Status   MRSA by PCR Next Gen DETECTED (Raivyn Kabler) NOT DETECTED Final    Comment: RESULT CALLED TO, READ BACK BY AND VERIFIED WITH:  C/ MIKE N., RN 05/21/22 2050 Cian Costanzo. LAFRANCE (NOTE) The GeneXpert MRSA Assay (FDA approved for NASAL specimens only), is one component of Karron Alvizo comprehensive MRSA colonization surveillance program. It is not intended to diagnose MRSA infection nor to guide or monitor treatment for MRSA infections. Test  performance is not FDA approved in patients less than 85 years old. Performed at [facility], [place] [address] [place], [place] [address]          Radiology Studies: DG Chest Port 1 View  Result Date: 05/23/2022 CLINICAL DATA:  Tachypnea. EXAM: PORTABLE CHEST 1 VIEW COMPARISON:  AP chest 05/21/2022 and 05/17/2022; CT chest 05/21/2022 FINDINGS: Left upper extremity PICC tip again overlies the superior vena cava/left brachiocephalic vein junction. Right chest wall electronic device with lead extending off of the right neck superior plane of view. Cardiac silhouette is again moderately enlarged. Moderate calcification within the aortic arch. Moderate to high-grade bilateral interstitial thickening, greatest within the left mid and lower lung where there are superimposed airspace opacifications. Heterogeneous airspace opacification within the right lower lung greater than right upper lung. Overall there is improved aeration  of the right mid lung. No large pleural effusion is identified. No pneumothorax. Electronic device again overlies the lower cardiac silhouette. This again may represent an abandoned pacer lead. No acute skeletal abnormality. IMPRESSION: Mildly improved aeration of the right mid lung with persistent moderate to high-grade bilateral interstitial thickening and left mid and lower lung and right lower lung denser airspace opacifications. Findings are again compatible with multifocal pneumonia. Electronically Signed   By: Yvonne Kendall M.D.   On: 05/23/2022 17:37        Scheduled Meds:  arformoterol  15 mcg Nebulization BID   aspirin EC  81 mg Oral Daily   atorvastatin  40 mg Oral Daily   budesonide (PULMICORT) nebulizer solution  0.5 mg Nebulization BID   carvedilol  10 mg Oral Daily   Chlorhexidine Gluconate Cloth  6 each Topical Daily   divalproex  1,000 mg Oral QHS   dorzolamide  1 drop Right Eye BID   furosemide  60 mg Intravenous Q12H   gabapentin  300 mg Oral BID    Gerhardt's butt cream   Topical BID   guaiFENesin  600 mg Oral BID   lactulose  20 g Oral BID   latanoprost  1 drop Right Eye BID   melatonin  10 mg Oral QHS   methylPREDNISolone (SOLU-MEDROL) injection  40 mg Intravenous Q12H   mupirocin ointment  1 Application Nasal BID   nystatin   Topical BID   pantoprazole  40 mg Oral BID   QUEtiapine  200 mg Oral QHS   revefenacin  175 mcg Nebulization Daily   sodium chloride flush  3 mL Intravenous Q12H   tamsulosin  0.4 mg Oral QHS   timolol  1 drop Right Eye BID   Continuous Infusions:  sodium chloride Stopped (05/21/22 2004)    ceFAZolin (ANCEF) IV 2 g (05/24/22 1357)     LOS: 3 days    Time spent: over 30 min    Fayrene Helper, MD [facility]   To contact the attending provider between 7A-7P or the covering provider during after hours 7P-7A, please log into the web site [email or web address] and access using universal [place] password for that web site. If you do not have the password, please call the hospital operator.  05/24/2022, 6:13 PM

## 2022-05-24 NOTE — Progress Notes (Signed)
Patient states he has a Inspire cpap and does not want our cpap at this time.

## 2022-05-24 NOTE — Progress Notes (Signed)
Physical Therapy Treatment Patient Details Name: Douglas Perkins. MRN: [id number] DOB: 02-17-1946 Today's Date: 05/24/2022   History of Present Illness Pt is a 77 y/o M admitted on 1/20 for SOB. 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 Pt on 3L [place] at baseline. Presently has B PNA, and acute respiratory failure. Cleared for PE.  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    PT Comments    Pt agreeable to OOB, however limited in safe mobility by decreased cognition in particular sequencing and command follow. Pt also with generalized weakness, and increased WoB with mobilization. Able to maintain SpO2 >88%O2 with mobilization on 4L O2 via HFNC. Pt is modA for bed mobility and totalAx2 for transfer to recliner. D/c plans remain appropriate. PT will continue to follow acutely.   Recommendations for follow up therapy are one component of a multi-disciplinary discharge planning process, led by the attending physician.  Recommendations may be updated based on patient status, additional functional criteria and insurance authorization.  Follow Up Recommendations  Skilled nursing-short term rehab (<3 hours/day) Can patient physically be transported by private vehicle: No   Assistance Recommended at Discharge Frequent or constant Supervision/Assistance  Patient can return home with the following A lot of help with walking and/or transfers;A lot of help with bathing/dressing/bathroom;Assistance with cooking/housework;Direct supervision/assist for medications management;Direct supervision/assist for financial management;Assist for transportation;Help with stairs or ramp for entrance   Equipment Recommendations  None recommended by PT (await decisions of SNF)    Recommendations for Other Services OT consult     Precautions / Restrictions Precautions Precautions:  Fall Precaution Comments: leadless pacer Restrictions Weight Bearing Restrictions: No     Mobility  Bed Mobility Overal bed mobility: Needs Assistance Bed Mobility: Supine to Sit, Sit to Supine     Supine to sit: HOB elevated, Mod assist     General bed mobility comments: requires increased cuing for sequencing coming to EoB, able to bring LE off bed but then needs multimodal cues for bringing UE across to bedrail    Transfers Overall transfer level: Needs assistance Equipment used: Rolling walker (2 wheels) Transfers: Sit to/from Stand, Bed to chair/wheelchair/BSC Sit to Stand: +2 physical assistance, From elevated surface, Total assist     Squat pivot transfers: Total assist, +2 physical assistance     General transfer comment: initially attempted to come to standing, however pt with decreased LE strength and unable to power up. then attempted to scoot laterally to recliner,but pt only able to lean forward and attempt to push up, unable to clear bottom, and has no attempt at lateral movement, ultimately requires total Ax2 for squat pivot to recliner, with lift pad in seat for ultimate lift back to bed        Balance Overall balance assessment: Needs assistance Sitting-balance support: Feet supported Sitting balance-Leahy Scale: Fair Sitting balance - Comments: able to static sit with no LoB benefits from UE support       Standing balance comment: unable to achieve                            Cognition Arousal/Alertness: Awake/alert Behavior During Therapy: WFL for tasks assessed/performed Overall Cognitive Status: Impaired/Different from baseline Area of Impairment: Problem solving, Awareness, Following commands, Safety/judgement, Attention  Current Attention Level: Selective   Following Commands: Follows one step commands with increased time Safety/Judgement: Decreased awareness of safety, Decreased awareness of  deficits Awareness: Intellectual Problem Solving: Requires verbal cues, Requires tactile cues General Comments: Pt with decreased command follow and problem solving with mobilization        Exercises General Exercises - Lower Extremity Heel Slides: AROM, Both, Seated, 10 reps Hip ABduction/ADduction: AROM, Both, 10 reps, Seated Hip Flexion/Marching: AROM, Both, 10 reps, Seated Toe Raises: AROM, Both, 10 reps, Seated Heel Raises: AROM, Both, 10 reps, Seated    General Comments General comments (skin integrity, edema, etc.): In semireclined state pt with difficulty clearing phlegm, VSS on 4L O2 able to maintain SpO2 >88%O2 with 3/4 DoE with attempts at mobilization      Pertinent Vitals/Pain Pain Assessment Pain Assessment: No/denies pain     PT Goals (current goals can now be found in the care plan section) Acute Rehab PT Goals Patient Stated Goal: get home and feel better PT Goal Formulation: With patient/family Time For Goal Achievement: 06/05/22 Potential to Achieve Goals: Fair Progress towards PT goals: Progressing toward goals    Frequency    Min 3X/week      PT Plan Current plan remains appropriate       AM-PAC PT "6 Clicks" Mobility   Outcome Measure  Help needed turning from your back to your side while in a flat bed without using bedrails?: A Lot Help needed moving from lying on your back to sitting on the side of a flat bed without using bedrails?: A Lot Help needed moving to and from a bed to a chair (including a wheelchair)?: A Lot Help needed standing up from a chair using your arms (e.g., wheelchair or bedside chair)?: A Lot Help needed to walk in hospital room?: Total Help needed climbing 3-5 steps with a railing? : Total 6 Click Score: 10    End of Session Equipment Utilized During Treatment: Oxygen Activity Tolerance: Patient tolerated treatment well Patient left: with call bell/phone within reach;with family/visitor present;in chair;with  chair alarm set;Other (comment) Nurse Communication: Mobility status (lift pad in chair for lift back to bed) PT Visit Diagnosis: Other abnormalities of gait and mobility (R26.89);Muscle weakness (generalized) (M62.81);Difficulty in walking, not elsewhere classified (R26.2)     Time: 3762-8315 PT Time Calculation (min) (ACUTE ONLY): 35 min  Charges:  $Therapeutic Exercise: 8-22 mins $Therapeutic Activity: 8-22 mins                     Breyton Vanscyoc B. Migdalia Dk PT, DPT Acute Rehabilitation Services Please use secure chat or  Call Office [phone]    [place] 05/24/2022, 10:05 AM

## 2022-05-24 NOTE — Progress Notes (Signed)
Mobility Specialist - Progress Note    05/24/22 1139  Mobility  Activity Moved into chair position in bed  Activity Response Tolerated well  Mobility Referral Yes  $Mobility charge 1 Mobility   Pt received in bed and needing to be cleaned up after BM. Pt was MinA to turn form side to side. Once cleaned up pt was agreeable to chair position. Pt left in bed with all needs met.   Franki Monte  Mobility Specialist Please contact via Solicitor or Rehab office at [phone]

## 2022-05-25 DIAGNOSIS — J9621 Acute and chronic respiratory failure with hypoxia: Secondary | ICD-10-CM | POA: Diagnosis not present

## 2022-05-25 LAB — CBC WITH DIFFERENTIAL/PLATELET
Abs Immature Granulocytes: 0.04 10*3/uL (ref 0.00–0.07)
Basophils Absolute: 0 10*3/uL (ref 0.0–0.1)
Basophils Relative: 0 %
Eosinophils Absolute: 0 10*3/uL (ref 0.0–0.5)
Eosinophils Relative: 0 %
HCT: 29.8 % — ABNORMAL LOW (ref 39.0–52.0)
Hemoglobin: 9.8 g/dL — ABNORMAL LOW (ref 13.0–17.0)
Immature Granulocytes: 1 %
Lymphocytes Relative: 13 %
Lymphs Abs: 0.9 10*3/uL (ref 0.7–4.0)
MCH: 30.6 pg (ref 26.0–34.0)
MCHC: 32.9 g/dL (ref 30.0–36.0)
MCV: 93.1 fL (ref 80.0–100.0)
Monocytes Absolute: 1.1 10*3/uL — ABNORMAL HIGH (ref 0.1–1.0)
Monocytes Relative: 16 %
Neutro Abs: 4.8 10*3/uL (ref 1.7–7.7)
Neutrophils Relative %: 70 %
Platelets: 353 10*3/uL (ref 150–400)
RBC: 3.2 MIL/uL — ABNORMAL LOW (ref 4.22–5.81)
RDW: 12.8 % (ref 11.5–15.5)
WBC: 6.8 10*3/uL (ref 4.0–10.5)
nRBC: 0 % (ref 0.0–0.2)

## 2022-05-25 LAB — BLOOD GAS, VENOUS
Acid-Base Excess: 13.4 mmol/L — ABNORMAL HIGH (ref 0.0–2.0)
Bicarbonate: 42.4 mmol/L — ABNORMAL HIGH (ref 20.0–28.0)
O2 Saturation: 73.7 %
Patient temperature: 37
pCO2, Ven: 75 mmHg (ref 44–60)
pH, Ven: 7.36 (ref 7.25–7.43)
pO2, Ven: 46 mmHg — ABNORMAL HIGH (ref 32–45)

## 2022-05-25 LAB — MAGNESIUM: Magnesium: 1.8 mg/dL (ref 1.7–2.4)

## 2022-05-25 LAB — COMPREHENSIVE METABOLIC PANEL
ALT: 50 U/L — ABNORMAL HIGH (ref 0–44)
AST: 40 U/L (ref 15–41)
Albumin: 2.1 g/dL — ABNORMAL LOW (ref 3.5–5.0)
Alkaline Phosphatase: 48 U/L (ref 38–126)
Anion gap: 7 (ref 5–15)
BUN: 25 mg/dL — ABNORMAL HIGH (ref 8–23)
CO2: 37 mmol/L — ABNORMAL HIGH (ref 22–32)
Calcium: 7.9 mg/dL — ABNORMAL LOW (ref 8.9–10.3)
Chloride: 88 mmol/L — ABNORMAL LOW (ref 98–111)
Creatinine, Ser: 0.61 mg/dL (ref 0.61–1.24)
GFR, Estimated: >60 mL/min (ref >60)
Glucose, Bld: 151 mg/dL — ABNORMAL HIGH (ref 70–99)
Potassium: 3.8 mmol/L (ref 3.5–5.1)
Sodium: 132 mmol/L — ABNORMAL LOW (ref 135–145)
Total Bilirubin: 0.3 mg/dL (ref 0.3–1.2)
Total Protein: 6.1 g/dL — ABNORMAL LOW (ref 6.5–8.1)

## 2022-05-25 LAB — BRAIN NATRIURETIC PEPTIDE: B Natriuretic Peptide: 377.2 pg/mL — ABNORMAL HIGH (ref 0.0–100.0)

## 2022-05-25 LAB — PHOSPHORUS: Phosphorus: 4.2 mg/dL (ref 2.5–4.6)

## 2022-05-25 MED ORDER — FUROSEMIDE 40 MG PO TABS
60.0000 mg | ORAL_TABLET | Freq: Every day | ORAL | Status: DC
  Administered 2022-05-26 – 2022-05-27 (×2): 60 mg via ORAL
  Filled 2022-05-25 (×3): qty 1

## 2022-05-25 MED ORDER — ALBUTEROL SULFATE (2.5 MG/3ML) 0.083% IN NEBU
2.5000 mg | INHALATION_SOLUTION | Freq: Four times a day (QID) | RESPIRATORY_TRACT | Status: DC
  Administered 2022-05-25 – 2022-05-27 (×5): 2.5 mg via RESPIRATORY_TRACT
  Filled 2022-05-25 (×5): qty 3

## 2022-05-25 MED ORDER — ALBUTEROL SULFATE (2.5 MG/3ML) 0.083% IN NEBU: 2.5000 mg | INHALATION_SOLUTION | RESPIRATORY_TRACT | Status: DC | PRN

## 2022-05-25 NOTE — TOC Progression Note (Signed)
Transition of Care (TOC) - Progression Note    Patient Details  Name: Douglas Perkins. MRN: [id number] Date of Birth: 07-Apr-1946  Transition of Care [facility]) CM/SW [place], [place] Phone Number: 05/25/2022, 12:49 PM  Clinical Narrative:     Patient has SNF bed at [facility] when medically ready. CSW will continue to follow and assist with patients dc planning needs.       Expected Discharge Plan and Services                                               Social Determinants of Health (SDOH) Interventions SDOH Screenings   Food Insecurity: No Food Insecurity (05/22/2022)  Housing: Low Risk  (05/22/2022)  Transportation Needs: No Transportation Needs (05/22/2022)  Utilities: Not At Risk (05/22/2022)  Alcohol Screen: Low Risk  (03/03/2021)  Financial Resource Strain: Low Risk  (05/23/2022)  Tobacco Use: Low Risk  (05/20/2022)    Readmission Risk Interventions    05/17/2022    2:11 PM  Readmission Risk Prevention Plan  Transportation Screening Complete  Medication Review (RN Care Manager) Complete  PCP or Specialist appointment within 3-5 days of discharge Complete  HRI or [place] Complete  SW Recovery Care/Counseling Consult Complete  Palliative Care Screening Not [place] Complete

## 2022-05-25 NOTE — Progress Notes (Signed)
TRH night cross cover note:   I was notified by RN of critical pCO2 of 75 on this morning's VBG.  Appears compensated from a pH standpoint.  RN also notified me that respiratory therapy is currently applying BiPAP.  I have placed order for repeat VBG for 8 AM this morning to further evaluate acid-base status, including in response to interval initiation of BiPAP, as above.    Babs Bertin, DO Hospitalist

## 2022-05-25 NOTE — Progress Notes (Signed)
SLP Cancellation Note  Patient Details Name: Douglas Perkins. MRN: [id number] DOB: 10-26-1945   Cancelled treatment:       Reason Eval/Treat Not Completed: Other (comment). Pt working with staff   Darcy Barbara, Katherene Ponto 05/25/2022, 2:46 PM

## 2022-05-25 NOTE — Progress Notes (Signed)
PROGRESS NOTE    Douglas Perkins.  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  Patient was hypoxic with significant work of breathing while in ED and started on BiPAP.  BNP elevated to 1662.  Troponin 81.  WBC 13.4.  VBG suggestive of metabolic alkalosis.  CXR with cardiomegaly, pulmonary vascular congestion and increased airspace opacities in the lungs bilaterally with improvement in RUL.  D-dimer slightly elevated to 1.38.  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  Blood cultures obtained.  Patient was started on IV Lasix, BiPAP and broad-spectrum antibiotics and admitted.   The next day, lower extremity venous Doppler negative for DVT.  CTA chest negative for PE but patchy bilateral airspace disease involving all lobes of both lungs suggesting multifocal pneumonia, small to moderate right-sided and small left pleural effusion.   Patient's breathing improved.  He was weaned to 4 L by nasal cannula.  Blood cultures NGTD.  No fever or leukocytosis.  Pro-Cal remains negative.  Active pneumonia felt to be less likely.  Antibiotics de-escalated to IV Ancef by ID to minimize fluid overload.   Remains on IV Lasix.  Consider increasing home diuretics on discharge.  Assessment & Plan:   Principal Problem:   Acute on chronic respiratory failure with hypoxemia (HCC) Active Problems:   Benign essential hypertension   Hyperlipidemia, mixed   Bilateral pneumonia   Bipolar I disorder (HCC)   Coronary artery disease involving native coronary artery of native heart   Obesity (BMI 30-39.9)   Cardiac pacemaker   History of Merkel cell carcinoma   History of sinoatrial node dysfunction   OSA (obstructive sleep apnea)   Infection of pacemaker lead wire (HCC)   Bacteremia due to group B Streptococcus   Acute on chronic diastolic CHF (congestive heart failure) (HCC)   Physical deconditioning   Morbid obesity ([place])   Acute encephalopathy  Acute on chronic respiratory failure with hypoxemia: on 3 L on discharge 1/18.  Most likely due to CHF versus pneumonia.  BNP elevated to 1660 (higher than baseline).  CXR suggested CHF.  CT findings likely from recent pneumonia versus active infection.  CTA chest negative for PE.  Hospital from 1/11-1/18 for CHF and multifocal pneumonia.  At that time, he was diuresed with IV Lasix 60 mg twice daily and discharged on p.o. Lasix 20 mg daily and 4 L oxygen.  Continue lasix.  Renal function and BP stable.  -Continue IV Lasix 60 mg twice daily.  Developing contraction alkalosis.  Will transition to PO lasix 60 mg daily and follow output.  -Appreciate help by ID (ok with transitioning to cefazolin or linezolid - see 1/22 note from Dr. Gale Journey) -Strict intake and output, daily weight, renal functions and electrolytes -Wean off BiPAP as able.  Minimum oxygen  to keep saturation above 88% despite home 4 L. -PT/OT-recommended SNF.   COPD Exacerbation - continue steroids  Acute on chronic diastolic CHF: Limited TTE on 1/12 with LVEF of 55 to 60%, indeterminate DD -Management as above.   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  Blood cultures NGTD.  Procalcitonin negative. -Antibiotics de-escalated to IV Ancef to minimize fluid overload   Multifocal opacity on CT chest: CTA chest negative for PE but concerning for multifocal opacity.  However, patient without fever, leukocytosis or sepsis physiology.  Pro-Cal negative.  Doubt active pneumonia or infection. -Antibiotics as above.   Acute metabolic encephalopathy: somnolent this 1/23.  Improved 1/24 AM.    He is on significant dose of Seroquel.  Also at risk for CO2 retention, though he's seemed to do ok without bipap. -Wean oxygen as above. -Decrease Seroquel to 200 mg nightly -Check basic encephalopathy labs including VBG (elevated PCO2, but normal pH again this AM - he was briefly on bipap, but generally I think will refuse this - was not getting this before, will watch off bipap for now), TSH (wnl), ammonia, B12 (wnl)  OSA: Has implantable inspire device, but doesn't have all necessary parts? -Minimum oxygen to keep saturation above 88%.   Physical deconditioning -PT/OT   Bipolar 1 disorder: Stable. -Continue home Depakote  -Decrease Seroquel from 300 to 200 mg nightly in the setting of encephalopathy.   Elevated troponin/history of CAD: Likely demand ischemia. -Management as above.   Elevated D-dimer: D-dimer 1.38.  LE venous Doppler and CTA chest negative for VTE.   Essential hypertension: Elevated BP today. -Resume home Coreg. -Continue holding home lisinopril   BPH without LUTS -Monitor urine output -Continue on Flomax   Morbid obesity Body mass index is 33.43 kg/m.     DVT prophylaxis: SCD Code Status: full Family Communication: none Disposition:   Status is: Inpatient Remains inpatient appropriate because: continued O2 need, continued resp failure   Consultants:  none  Procedures:  LE [place] Summary:  BILATERAL:  - No evidence of deep vein thrombosis seen in the  lower extremities,  bilaterally.  -No evidence of popliteal cyst, bilaterally.   Echo IMPRESSIONS     1. Poor quality study. Overall, LVEF appears normal but cannot assess for  wall motion. Limited evaluation of valvular structures due to poor  windows. Suggest TEE if there clinical concerns for endocarditis.   2. Left ventricular ejection fraction, by estimation, is 55 to 60%. The  left ventricle has normal function. Left ventricular endocardial border  not optimally defined to evaluate regional wall motion. Indeterminate  diastolic filling due to E-Maurie Olesen fusion.   3. Right ventricular systolic function was not well visualized. The right  ventricular size is not well visualized.   4. The mitral valve was not well visualized.   5. The aortic valve was not well visualized.   6. The inferior vena cava is dilated in size with <50% respiratory  variability, suggesting right atrial pressure of 15 mmHg.   Antimicrobials:  Anti-infectives (From admission, onward)    Start     Dose/Rate Route Frequency Ordered Stop   05/22/22 1800  penicillin G potassium 12 Million Units in dextrose 5 % 500 mL continuous infusion  Status:  Discontinued        12 Million Units 41.7 mL/hr over 12 Hours Intravenous Every 12 hours 05/22/22 1112 05/22/22 1328   05/22/22 1800  ceFAZolin (  ANCEF) IVPB 2g/100 mL premix        2 g 200 mL/hr over 30 Minutes Intravenous Every 8 hours 05/22/22 1328     05/22/22 1200  penicillin G potassium 12 Million Units in dextrose 5 % 500 mL continuous infusion  Status:  Discontinued        12 Million Units 41.7 mL/hr over 12 Hours Intravenous Every 12 hours 05/22/22 1112 05/22/22 1112   05/21/22 1800  vancomycin (VANCOCIN) IVPB 1000 mg/200 mL premix  Status:  Discontinued        1,000 mg 200 mL/hr over 60 Minutes Intravenous Every 12 hours 05/21/22 0546 05/22/22 1109   05/21/22 0800  ceFEPIme (MAXIPIME) 2 g in sodium chloride 0.9 % 100 mL IVPB  Status:  Discontinued        2  g 200 mL/hr over 30 Minutes Intravenous Every 8 hours 05/21/22 0546 05/22/22 1109   05/21/22 0130  ceFEPIme (MAXIPIME) 2 g in sodium chloride 0.9 % 100 mL IVPB        2 g 200 mL/hr over 30 Minutes Intravenous  Once 05/21/22 0117 05/21/22 0346   05/21/22 0130  vancomycin (VANCOREADY) IVPB 2000 mg/400 mL        2,000 mg 200 mL/hr over 120 Minutes Intravenous  Once 05/21/22 0128 05/21/22 0814       Subjective: No new complaints  Objective: Vitals:   05/25/22 0831 05/25/22 1247 05/25/22 1303 05/25/22 1610  BP:  111/65  (!) 174/70  Pulse: 73 74 75 67  Resp:  '20 20 19  '$ Temp:  98.3 F (36.8 C)  98.5 F (36.9 C)  TempSrc:  Oral  Oral  SpO2: 93% 96% 92% 97%  Weight:      Height:        Intake/Output Summary (Last 24 hours) at 05/25/2022 1756 Last data filed at 05/25/2022 1611 Gross per 24 hour  Intake 1637.74 ml  Output 3000 ml  Net -1362.26 ml   Filed Weights   05/23/22 0602 05/24/22 0311 05/25/22 0332  Weight: 112.1 kg 108 kg 111.8 kg    Examination:  General: No acute distress. Cardiovascular: RRR Lungs: continued wheezing, coarse breath sounds, seems improved Abdomen: Soft, nontender, nondistended Neurological: Alert and oriented 3. Moves all extremities 4 with equal strength. Cranial nerves II through XII grossly intact. Extremities: No clubbing or cyanosis. No edema.  Data Reviewed: I have personally reviewed following labs and imaging studies  CBC: Recent Labs  Lab 05/20/22 2240 05/21/22 0010 05/21/22 0543 05/22/22 5462 05/23/22 0515 05/24/22 0550 05/25/22 0437  WBC 13.4*  --  11.6* 10.6* 10.5 6.1 6.8  NEUTROABS 11.1*  --  8.1*  --   --   --  4.8  HGB 10.8*   < > 9.0* 9.9* 10.1* 10.0* 9.8*  HCT 32.4*   < > 28.6* 31.2* 31.3* 31.0* 29.8*  MCV 93.1  --  96.3 96.6 93.7 94.8 93.1  PLT 341  --  348 308 347 325 353   < > = values in this interval not displayed.    Basic Metabolic Panel: Recent Labs  Lab 05/21/22 0830 05/22/22 0607 05/23/22 0515  05/24/22 0550 05/25/22 0437  NA 140 139 137 138 132*  K 3.2* 3.8 3.4* 4.1 3.8  CL 97* 97* 94* 93* 88*  CO2 37* 33* 34* 37* 37*  GLUCOSE 107* 135* 134* 154* 151*  BUN '12 13 11 18 '$ 25*  CREATININE 0.79 0.71 0.77 0.92 0.61  CALCIUM 8.3* 8.4* 8.5* 8.1* 7.9*  MG 1.8 1.8 1.7 2.0 1.8  PHOS 2.9 2.6 3.3 4.8* 4.2    GFR: Estimated Creatinine Clearance: 98.3 mL/min (by C-G formula based on SCr of 0.61 mg/dL).  Liver Function Tests: Recent Labs  Lab 05/22/22 0607 05/23/22 0515 05/23/22 1802 05/24/22 0550 05/25/22 0437  AST  --   --  52*  --  40  ALT  --   --  70*  --  50*  ALKPHOS  --   --  57  --  48  BILITOT  --   --  0.4  --  0.3  PROT  --   --  7.2  --  6.1*  ALBUMIN 2.2* 2.3* 2.6* 2.2* 2.1*    CBG: Recent Labs  Lab 05/24/22 0800 05/24/22 1546 05/24/22 2122  GLUCAP 133* 133* 147*     Recent Results (from the past 240 hour(s))  Resp panel by RT-PCR (RSV, Flu Zafiro Routson&B, Covid) Anterior Nasal Swab     Status: None   Collection Time: 05/20/22 10:08 PM   Specimen: Anterior Nasal Swab  Result Value Ref Range Status   SARS Coronavirus 2 by RT PCR NEGATIVE NEGATIVE Final    Comment: (NOTE) SARS-CoV-2 target nucleic acids are NOT DETECTED.  The SARS-CoV-2 RNA is generally detectable in upper respiratory specimens during the acute phase of infection. The lowest concentration of SARS-CoV-2 viral copies this assay can detect is 138 copies/mL. Daphnee Preiss negative result does not preclude SARS-Cov-2 infection and should not be used as the sole basis for treatment or other patient management decisions. Shanda Cadotte negative result may occur with  improper specimen collection/handling, submission of specimen other than nasopharyngeal swab, presence of viral mutation(s) within the areas targeted by this assay, and inadequate number of viral copies(<138 copies/mL). Militza Devery negative result must be combined with clinical observations, patient history, and epidemiological information. The expected result is  Negative.  Fact Sheet for Patients:  EntrepreneurPulse.com.au  Fact Sheet for Healthcare Providers:  IncredibleEmployment.be  This test is no t yet approved or cleared by the [place] FDA and  has been authorized for detection and/or diagnosis of SARS-CoV-2 by FDA under an Emergency Use Authorization (EUA). This EUA will remain  in effect (meaning this test can be used) for the duration of the COVID-19 declaration under Section 564(b)(1) of the Act, 21 U.S.C.section 360bbb-3(b)(1), unless the authorization is terminated  or revoked sooner.       Influenza Angelus Hoopes by PCR NEGATIVE NEGATIVE Final   Influenza B by PCR NEGATIVE NEGATIVE Final    Comment: (NOTE) The Xpert Xpress SARS-CoV-2/FLU/RSV plus assay is intended as an aid in the diagnosis of influenza from Nasopharyngeal swab specimens and should not be used as Daleigh Pollinger sole basis for treatment. Nasal washings and aspirates are unacceptable for Xpert Xpress SARS-CoV-2/FLU/RSV testing.  Fact Sheet for Patients: EntrepreneurPulse.com.au  Fact Sheet for Healthcare Providers: IncredibleEmployment.be  This test is not yet approved or cleared by the [place] FDA and has been authorized for detection and/or diagnosis of SARS-CoV-2 by FDA under an Emergency Use Authorization (EUA). This EUA will remain in effect (meaning this test can be used) for the duration of the COVID-19 declaration under Section 564(b)(1) of the Act, 21 U.S.C. section 360bbb-3(b)(1), unless the authorization is terminated or revoked.     Resp Syncytial Virus by PCR NEGATIVE NEGATIVE Final    Comment: (NOTE) Fact Sheet for Patients: EntrepreneurPulse.com.au  Fact Sheet for Healthcare Providers: IncredibleEmployment.be  This test is not yet approved or cleared by the [place]  FDA and has been authorized for detection and/or diagnosis of  SARS-CoV-2 by FDA under an Emergency Use Authorization (EUA). This EUA will remain in effect (meaning this test can be used) for the duration of the COVID-19 declaration under Section 564(b)(1) of the Act, 21 U.S.C. section 360bbb-3(b)(1), unless the authorization is terminated or revoked.  Performed at [facility], [place] [address], [place], [place] [address]   Culture, blood (Routine X 2) w Reflex to ID Panel     Status: None (Preliminary result)   Collection Time: 05/21/22  5:43 AM   Specimen: BLOOD RIGHT HAND  Result Value Ref Range Status   Specimen Description BLOOD RIGHT HAND  Final   Special Requests   Final    BOTTLES DRAWN AEROBIC AND ANAEROBIC Blood Culture adequate volume   Culture   Final    NO GROWTH 4 DAYS Performed at [facility], [place] [address] [place], [place] [address]    Report Status PENDING  Incomplete  Culture, blood (Routine X 2) w Reflex to ID Panel     Status: None (Preliminary result)   Collection Time: 05/21/22  5:44 AM   Specimen: BLOOD LEFT FOREARM  Result Value Ref Range Status   Specimen Description BLOOD LEFT FOREARM  Final   Special Requests   Final    BOTTLES DRAWN AEROBIC AND ANAEROBIC Blood Culture adequate volume   Culture   Final    NO GROWTH 4 DAYS Performed at [facility], [place] [address] [place], [place] [address]    Report Status PENDING  Incomplete  MRSA Next Gen by PCR, Nasal     Status: Abnormal   Collection Time: 05/21/22  6:49 PM   Specimen: Nasal Mucosa; Nasal Swab  Result Value Ref Range Status   MRSA by PCR Next Gen DETECTED (Docia Klar) NOT DETECTED Final    Comment: RESULT CALLED TO, READ BACK BY AND VERIFIED WITH:  C/ MIKE N., RN 05/21/22 2050 Franke Menter. LAFRANCE (NOTE) The GeneXpert MRSA Assay (FDA approved for NASAL specimens only), is one component of Savian Mazon comprehensive MRSA colonization surveillance program. It is not intended to diagnose MRSA infection nor to guide or monitor treatment for MRSA infections. Test  performance is not FDA approved in patients less than 75 years old. Performed at [facility], [place] [address] [place], [place] [address]          Radiology Studies: No results found.      Scheduled Meds:  arformoterol  15 mcg Nebulization BID   aspirin EC  81 mg Oral Daily   atorvastatin  40 mg Oral Daily   budesonide (PULMICORT) nebulizer solution  0.5 mg Nebulization BID   carvedilol  10 mg Oral Daily   Chlorhexidine Gluconate Cloth  6 each Topical Daily   divalproex  1,000 mg Oral QHS   dorzolamide  1 drop Right Eye BID   furosemide  60 mg Intravenous Q12H   gabapentin  300 mg Oral BID   Gerhardt's butt cream   Topical BID   guaiFENesin  600 mg Oral BID   lactulose  20 g Oral BID   latanoprost  1 drop Right Eye BID   melatonin  10 mg Oral QHS   methylPREDNISolone (SOLU-MEDROL) injection  40 mg Intravenous Q12H   mupirocin ointment  1 Application Nasal BID   nystatin   Topical BID   pantoprazole  40 mg Oral BID   QUEtiapine  200 mg Oral QHS   revefenacin  175 mcg Nebulization Daily   sodium chloride flush  3 mL Intravenous Q12H   tamsulosin  0.4 mg Oral QHS   timolol  1 drop Right Eye BID   Continuous Infusions:  sodium chloride Stopped (05/21/22 2004)    ceFAZolin (ANCEF) IV 2 g (05/25/22 1249)     LOS: 4 days    Time spent: over 30 min    Fayrene Helper, MD [facility]   To contact the attending provider between 7A-7P or the covering provider during after hours 7P-7A, please log into the web site [email or web address] and access using universal [place] password for that web site. If you do not have the password, please call the hospital operator.  05/25/2022, 5:56 PM

## 2022-05-25 NOTE — Progress Notes (Signed)
Mobility Specialist - Progress Note   05/25/22 1246  Mobility  Activity Transferred from chair to bed  Level of Assistance Total care  Assistive Device MaxiMove  Activity Response Tolerated well  Mobility Referral Yes  $Mobility charge 1 Mobility   Pt was received in chair and requesting assistance back to bed. No complaints throughout. Pt was left in bed with all needs met.   Franki Monte  Mobility Specialist Please contact via Solicitor or Rehab office at [phone]

## 2022-05-25 NOTE — Progress Notes (Signed)
Patient refused use of BIPAP for the evening.  °

## 2022-05-25 NOTE — Progress Notes (Signed)
No BIPAP needed at this time. RT will continue to monitor.

## 2022-05-25 NOTE — Progress Notes (Signed)
Mobility Specialist - Progress Note   05/25/22 1119  Mobility  Level of Assistance Total care  Assistive Device MaxiMove  Activity Response Tolerated well  Mobility Referral Yes  $Mobility charge 1 Mobility   Pt was received in bed and agreeable to get up in chair. Pt was MaxiMove +2 to be transferred to chair. Pt was left in chair with all needs met. NT present.  Franki Monte  Mobility Specialist Please contact via Solicitor or Rehab office at [phone]

## 2022-05-26 ENCOUNTER — Other Ambulatory Visit: Payer: Self-pay

## 2022-05-26 DIAGNOSIS — J9621 Acute and chronic respiratory failure with hypoxia: Secondary | ICD-10-CM | POA: Diagnosis not present

## 2022-05-26 LAB — COMPREHENSIVE METABOLIC PANEL
ALT: 49 U/L — ABNORMAL HIGH (ref 0–44)
AST: 41 U/L (ref 15–41)
Albumin: 2.2 g/dL — ABNORMAL LOW (ref 3.5–5.0)
Alkaline Phosphatase: 47 U/L (ref 38–126)
Anion gap: 8 (ref 5–15)
BUN: 15 mg/dL (ref 8–23)
CO2: 38 mmol/L — ABNORMAL HIGH (ref 22–32)
Calcium: 8.1 mg/dL — ABNORMAL LOW (ref 8.9–10.3)
Chloride: 88 mmol/L — ABNORMAL LOW (ref 98–111)
Creatinine, Ser: 0.65 mg/dL (ref 0.61–1.24)
GFR, Estimated: >60 mL/min (ref >60)
Glucose, Bld: 151 mg/dL — ABNORMAL HIGH (ref 70–99)
Potassium: 3.7 mmol/L (ref 3.5–5.1)
Sodium: 134 mmol/L — ABNORMAL LOW (ref 135–145)
Total Bilirubin: 0.3 mg/dL (ref 0.3–1.2)
Total Protein: 6.2 g/dL — ABNORMAL LOW (ref 6.5–8.1)

## 2022-05-26 LAB — CBC WITH DIFFERENTIAL/PLATELET
Abs Immature Granulocytes: 0.04 10*3/uL (ref 0.00–0.07)
Basophils Absolute: 0 10*3/uL (ref 0.0–0.1)
Basophils Relative: 0 %
Eosinophils Absolute: 0 10*3/uL (ref 0.0–0.5)
Eosinophils Relative: 0 %
HCT: 31.7 % — ABNORMAL LOW (ref 39.0–52.0)
Hemoglobin: 10.4 g/dL — ABNORMAL LOW (ref 13.0–17.0)
Immature Granulocytes: 1 %
Lymphocytes Relative: 15 %
Lymphs Abs: 1.2 10*3/uL (ref 0.7–4.0)
MCH: 30.3 pg (ref 26.0–34.0)
MCHC: 32.8 g/dL (ref 30.0–36.0)
MCV: 92.4 fL (ref 80.0–100.0)
Monocytes Absolute: 0.9 10*3/uL (ref 0.1–1.0)
Monocytes Relative: 12 %
Neutro Abs: 5.6 10*3/uL (ref 1.7–7.7)
Neutrophils Relative %: 72 %
Platelets: 402 10*3/uL — ABNORMAL HIGH (ref 150–400)
RBC: 3.43 MIL/uL — ABNORMAL LOW (ref 4.22–5.81)
RDW: 12.8 % (ref 11.5–15.5)
WBC: 7.7 10*3/uL (ref 4.0–10.5)
nRBC: 0 % (ref 0.0–0.2)

## 2022-05-26 LAB — MAGNESIUM: Magnesium: 1.8 mg/dL (ref 1.7–2.4)

## 2022-05-26 LAB — CULTURE, BLOOD (ROUTINE X 2)
Culture: NO GROWTH
Culture: NO GROWTH
Special Requests: ADEQUATE
Special Requests: ADEQUATE

## 2022-05-26 LAB — PHOSPHORUS: Phosphorus: 3.1 mg/dL (ref 2.5–4.6)

## 2022-05-26 NOTE — Progress Notes (Addendum)
Physical Therapy Treatment Patient Details Name: Douglas Perkins. MRN: [id number] DOB: 05-04-1945 Today's Date: 05/26/2022   History of Present Illness Pt is a 77 y/o M admitted on 1/20 for SOB. 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 Pt on 3L [place] at baseline. Presently has B PNA, and acute respiratory failure. Cleared for PE.  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    PT Comments    Pt continues therapy today, with very severe SOB noted, and was able to do bed ex routine for therapy.  Pt is on HFNC, taking his time with rest breaks to do exercises, very intolerant of postural changes esp to lower his head.  Recommend him to SNF care as he is quite weak, has low endurance and his sats and HR are difficult to control with any activity.  Follow acutely as planned for PT goals of care.   Recommendations for follow up therapy are one component of a multi-disciplinary discharge planning process, led by the attending physician.  Recommendations may be updated based on patient status, additional functional criteria and insurance authorization.  Follow Up Recommendations  Skilled nursing-short term rehab (<3 hours/day) Can patient physically be transported by private vehicle: No   Assistance Recommended at Discharge Frequent or constant Supervision/Assistance  Patient can return home with the following Two people to help with walking and/or transfers;A lot of help with bathing/dressing/bathroom;Assistance with cooking/housework;Assist for transportation;Help with stairs or ramp for entrance   Equipment Recommendations  None recommended by PT    Recommendations for Other Services       Precautions / Restrictions Precautions Precautions: Fall Precaution Comments: leadless pacer Restrictions Weight Bearing Restrictions: No     Mobility  Bed Mobility Overal  bed mobility: Needs Assistance Bed Mobility: Rolling Rolling: Mod assist         General bed mobility comments: adjusting in bed requires significant help    Transfers                   General transfer comment: deferred    Ambulation/Gait                   Stairs             Wheelchair Mobility    Modified Rankin (Stroke Patients Only)       Balance                                            Cognition Arousal/Alertness: Awake/alert Behavior During Therapy: WFL for tasks assessed/performed Overall Cognitive Status: Within Functional Limits for tasks assessed                                 General Comments: decreased to follow movement to get OOB but in bed fairly good follow, but [facility]        Exercises General Exercises - Lower Extremity Ankle Circles/Pumps: AAROM, 5 reps Quad Sets: AROM, 10 reps Gluteal Sets: AROM, 10 reps Heel Slides: AROM, 10 reps Hip ABduction/ADduction: AROM, AAROM, 10 reps Hip Flexion/Marching: AROM, 10 reps    General Comments        Pertinent Vitals/Pain Pain Assessment Pain Assessment: Faces Faces Pain  Scale: Hurts a little bit Pain Location: all over- pt reports "normal arthritis" Pain Descriptors / Indicators: Guarding    Home Living                          Prior Function            PT Goals (current goals can now be found in the care plan section) Acute Rehab PT Goals Patient Stated Goal: get stronger and get home    Frequency    Min 3X/week      PT Plan Current plan remains appropriate    Co-evaluation              AM-PAC PT "6 Clicks" Mobility   Outcome Measure  Help needed turning from your back to your side while in a flat bed without using bedrails?: A Lot Help needed moving from lying on your back to sitting on the side of a flat bed without using bedrails?: Total Help needed moving to and from a bed to a chair (including a  wheelchair)?: Total Help needed standing up from a chair using your arms (e.g., wheelchair or bedside chair)?: Total Help needed to walk in hospital room?: Total Help needed climbing 3-5 steps with a railing? : Total 6 Click Score: 7    End of Session Equipment Utilized During Treatment: Oxygen Activity Tolerance: No increased pain;Patient tolerated treatment well Patient left: in bed;with call bell/phone within reach;with bed alarm set Nurse Communication: Mobility status PT Visit Diagnosis: Other abnormalities of gait and mobility (R26.89);Muscle weakness (generalized) (M62.81)     Time: 5035-4656 PT Time Calculation (min) (ACUTE ONLY): 29 min  Charges:  $Therapeutic Activity: 23-37 mins     Ramond Dial 05/26/2022, 12:52 PM  Mee Hives, PT PhD Acute Rehab Dept. Number: [place] and [place]

## 2022-05-26 NOTE — Care Management Important Message (Signed)
Important Message  Patient Details  Name: Douglas Perkins. MRN: [id number] Date of Birth: 12-08-1945   Medicare Important Message Given:  Yes     Shelda Altes 05/26/2022, 9:06 AM

## 2022-05-26 NOTE — TOC Progression Note (Addendum)
Transition of Care (TOC) - Progression Note    Patient Details  Name: Yu Peggs. MRN: [id number] Date of Birth: 1945/10/18  Transition of Care [facility]) CM/SW [place], [place] Phone Number: 05/26/2022, 1:35 PM  Clinical Narrative:      Star with [facility] confirmed they can accept patient tomorrow if medically ready. CSW informed MD. Facility confirmed they can accept patient on IV antibiotics. CSW will continue to follow.        Expected Discharge Plan and Services                                               Social Determinants of Health (SDOH) Interventions SDOH Screenings   Food Insecurity: No Food Insecurity (05/22/2022)  Housing: Low Risk  (05/22/2022)  Transportation Needs: No Transportation Needs (05/22/2022)  Utilities: Not At Risk (05/22/2022)  Alcohol Screen: Low Risk  (03/03/2021)  Financial Resource Strain: Low Risk  (05/23/2022)  Tobacco Use: Low Risk  (05/20/2022)    Readmission Risk Interventions    05/17/2022    2:11 PM  Readmission Risk Prevention Plan  Transportation Screening Complete  Medication Review (RN Care Manager) Complete  PCP or Specialist appointment within 3-5 days of discharge Complete  HRI or [place] Complete  SW Recovery Care/Counseling Consult Complete  Palliative Care Screening Not [place] Complete

## 2022-05-26 NOTE — Evaluation (Addendum)
Speech Language Pathology Treatment: Dysphagia  Patient Details Name: Douglas Perkins. MRN: [id number] DOB: 11-Apr-1946 Today's Date: 05/26/2022 Time: 6599-3570 SLP Time Calculation (min) (ACUTE ONLY): 9 min  Assessment / Plan / Recommendation Clinical Impression  Pt now with decreased clinical suspicion for pna per MD noted on 1/25 "active pneumonia felt to be less likely.  Antibiotics de-escalated to IV Ancef by ID to minimize fluid overload.  Remains on IV Lasix.  Consider increasing home diuretics on discharge." Pt has been improving on regular diet and thin liquids. SLP reiterated importance of single sips and awareness of respiratory status and strategy previously recommended on recent MBS. Pt again consistently return demonstrated this. No sign of impulsivity today. RR stable. No need for further acute SLP interventions. Will sign off.   HPI HPI: Pt is a 77 y/o M admitted on 1/20 for SOB. 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 Pt on 3L [place] at baseline. Presently has B PNA, and acute respiratory failure. Cleared for PE.  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  MBS in January 2024 "Pts overall pharyngeal function is relatively intact but as he starts to become more short of breath and more impulsive, his swallow becomes less organized and timely. This leads to moments of aspiration with thin liquids (PAS 7), which are not eliminated by use of a chin tuck (PAS 8). When physically assisted to take single sips he has improving airway protection (PAS 2), but he can protect his airway well with nectar thick liquids (PAS 2) regardless of any pacing or external assist.      SLP Plan  All goals met      Recommendations for follow up therapy are one component of a multi-disciplinary discharge planning process, led by the attending physician.  Recommendations may be  updated based on patient status, additional functional criteria and insurance authorization.    Recommendations  Diet recommendations: Regular;Thin liquid                Oral Care Recommendations: Oral care BID Follow Up Recommendations: Skilled nursing-short term rehab (<3 hours/day) SLP Visit Diagnosis: Dysphagia, oropharyngeal phase (R13.12) Plan: All goals met           Douglas Perkins, Douglas Perkins  05/26/2022, 9:59 AM

## 2022-05-26 NOTE — Progress Notes (Signed)
PROGRESS NOTE    Douglas Perkins.  DJS:[id number] DOB: December 30, 1945 DOA: 05/20/2022 PCP: Pcp, No  Chief Complaint  Patient presents with   Shortness of Breath    Brief Narrative:  77 year old M with PMH of diastolic CHF, hypoxic RF on 4 L, SSS/PPM, HTN, OSA with inspire device, morbid obesity, Merkel cell cancer s/p radiation to right leg, bipolar disorder, BPH, hospitalization from 12/10-22 for GBS bacteremia and possible PPM infection and another hospitalization from 1/11-1/18 for CHF exacerbation and multifocal pneumonia returning from SNF with shortness of breath and hypoxemia to 80s and admitted for acute respiratory failure with hypoxia due to acute diastolic CHF and possible bilateral pneumonia.  Patient was hypoxic with significant work of breathing while in ED and started on BiPAP.  BNP elevated to 1662.  Troponin 81.  WBC 13.4.  VBG suggestive of metabolic alkalosis.  CXR with cardiomegaly, pulmonary vascular congestion and increased airspace opacities in the lungs bilaterally with improvement in RUL.  D-dimer slightly elevated to 1.38.  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  Blood cultures obtained.  Patient was started on IV Lasix, BiPAP and broad-spectrum antibiotics and admitted.   The next day, lower extremity venous Doppler negative for DVT.  CTA chest negative for PE but patchy bilateral airspace disease involving all lobes of both lungs suggesting multifocal pneumonia, small to moderate right-sided and small left pleural effusion.   Patient's breathing improved.  He was weaned to 4 L by nasal cannula.  Blood cultures NGTD.  No fever or leukocytosis.  Pro-Cal remains negative.  Active pneumonia felt to be less likely.  Antibiotics de-escalated to IV Ancef by ID to minimize fluid overload.   Remains on IV Lasix.  Consider increasing home diuretics on discharge.  Assessment & Plan:   Principal Problem:   Acute on chronic respiratory failure with hypoxemia (HCC) Active Problems:   Benign essential hypertension   Hyperlipidemia, mixed   Bilateral pneumonia   Bipolar I disorder (HCC)   Coronary artery disease involving native coronary artery of native heart   Obesity (BMI 30-39.9)   Cardiac pacemaker   History of Merkel cell carcinoma   History of sinoatrial node dysfunction   OSA (obstructive sleep apnea)   Infection of pacemaker lead wire (HCC)   Bacteremia due to group B Streptococcus   Acute on chronic diastolic CHF (congestive heart failure) (HCC)   Physical deconditioning   Morbid obesity ([place])   Acute encephalopathy  Acute on chronic respiratory failure with hypoxemia: on 3 L on discharge 1/18.  Most likely due to CHF versus pneumonia.  BNP elevated to 1660 (higher than baseline).  CXR suggested CHF.  CT findings likely from recent pneumonia versus active infection.  CTA chest negative for PE.  Hospital from 1/11-1/18 for CHF and multifocal pneumonia.  At that time, he was diuresed with IV Lasix 60 mg twice daily and discharged on p.o. Lasix 20 mg daily and 4 L oxygen.  Continue lasix.  Renal function and BP stable.  -currently on room air  -Developing contraction alkalosis.  Will transition to PO lasix 60 mg daily and follow output.  -Appreciate help by ID (ok with transitioning to cefazolin or linezolid - see 1/22 note from Dr. Gale Journey) -Strict intake and output, daily weight, renal functions and electrolytes -Wean off BiPAP as able.  Minimum oxygen to keep saturation  above 88% despite home 4 L. -PT/OT-recommended SNF.   COPD Exacerbation - continue steroids  Acute on chronic diastolic CHF: Limited TTE on 1/12 with LVEF of 55 to 60%, indeterminate DD -Management as above.   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  Blood cultures NGTD.  Procalcitonin negative. -Antibiotics de-escalated to IV Ancef to minimize fluid overload   Multifocal opacity on CT chest: CTA chest negative for PE but concerning for multifocal opacity.  However, patient without fever, leukocytosis or sepsis physiology.  Pro-Cal negative.  Doubt active pneumonia or infection. -Antibiotics as above.   Acute metabolic encephalopathy: somnolent this 1/23.  Improved 1/24 AM.    He is on significant dose of Seroquel.  Also at risk for CO2 retention, though he's seemed to do ok without bipap. -Wean oxygen as above. -Decrease Seroquel to 200 mg nightly -Check basic encephalopathy labs including VBG (elevated PCO2, but normal pH again this AM - he was briefly on bipap, but generally I think will refuse this - was not getting this before, will watch off bipap for now), TSH (wnl), ammonia, B12 (wnl)  OSA: Has implantable inspire device, but doesn't have all necessary parts? -Minimum oxygen to keep saturation above 88%.   Physical deconditioning -PT/OT   Bipolar 1 disorder: Stable. -Continue home Depakote  -Decrease Seroquel from 300 to 200 mg nightly in the setting of encephalopathy.   Elevated troponin/history of CAD: Likely demand ischemia. -Management as above.   Elevated D-dimer: D-dimer 1.38.  LE venous Doppler and CTA chest negative for VTE.   Essential hypertension: Elevated BP today. -Resume home Coreg. -Continue holding home lisinopril   BPH without LUTS -Monitor urine output -Continue on Flomax   Morbid obesity Body mass index is 33.43 kg/m.    DVT prophylaxis: SCD Code Status: full Family Communication: none Disposition:   Status is: Inpatient Remains inpatient appropriate because: continued O2 need, continued resp failure   Consultants:  none  Procedures:  LE [place] Summary:  BILATERAL:  - No evidence of deep vein thrombosis seen in the lower extremities,   bilaterally.  -No evidence of popliteal cyst, bilaterally.   Echo IMPRESSIONS     1. Poor quality study. Overall, LVEF appears normal but cannot assess for  wall motion. Limited evaluation of valvular structures due to poor  windows. Suggest TEE if there clinical concerns for endocarditis.   2. Left ventricular ejection fraction, by estimation, is 55 to 60%. The  left ventricle has normal function. Left ventricular endocardial border  not optimally defined to evaluate regional wall motion. Indeterminate  diastolic filling due to E-Vaudine Dutan fusion.   3. Right ventricular systolic function was not well visualized. The right  ventricular size is not well visualized.   4. The mitral valve was not well visualized.   5. The aortic valve was not well visualized.   6. The inferior vena cava is dilated in size with <50% respiratory  variability, suggesting right atrial pressure of 15 mmHg.   Antimicrobials:  Anti-infectives (From admission, onward)    Start     Dose/Rate Route Frequency Ordered Stop   05/22/22 1800  penicillin G potassium 12 Million Units in dextrose 5 % 500 mL continuous infusion  Status:  Discontinued        12 Million Units 41.7 mL/hr over 12 Hours Intravenous Every 12 hours 05/22/22 1112 05/22/22 1328   05/22/22 1800  ceFAZolin (ANCEF) IVPB 2g/100 mL  premix        2 g 200 mL/hr over 30 Minutes Intravenous Every 8 hours 05/22/22 1328     05/22/22 1200  penicillin G potassium 12 Million Units in dextrose 5 % 500 mL continuous infusion  Status:  Discontinued        12 Million Units 41.7 mL/hr over 12 Hours Intravenous Every 12 hours 05/22/22 1112 05/22/22 1112   05/21/22 1800  vancomycin (VANCOCIN) IVPB 1000 mg/200 mL premix  Status:  Discontinued        1,000 mg 200 mL/hr over 60 Minutes Intravenous Every 12 hours 05/21/22 0546 05/22/22 1109   05/21/22 0800  ceFEPIme (MAXIPIME) 2 g in sodium chloride 0.9 % 100 mL IVPB  Status:  Discontinued        2 g 200 mL/hr over 30  Minutes Intravenous Every 8 hours 05/21/22 0546 05/22/22 1109   05/21/22 0130  ceFEPIme (MAXIPIME) 2 g in sodium chloride 0.9 % 100 mL IVPB        2 g 200 mL/hr over 30 Minutes Intravenous  Once 05/21/22 0117 05/21/22 0346   05/21/22 0130  vancomycin (VANCOREADY) IVPB 2000 mg/400 mL        2,000 mg 200 mL/hr over 120 Minutes Intravenous  Once 05/21/22 0128 05/21/22 0814       Subjective: No complaints  Objective: Vitals:   05/25/22 0831 05/25/22 1247 05/25/22 1303 05/25/22 1610  BP:  111/65  (!) 174/70  Pulse: 73 74 75 67  Resp:  '20 20 19  '$ Temp:  98.3 F (36.8 C)  98.5 F (36.9 C)  TempSrc:  Oral  Oral  SpO2: 93% 96% 92% 97%  Weight:      Height:        Intake/Output Summary (Last 24 hours) at 05/25/2022 1756 Last data filed at 05/25/2022 1611 Gross per 24 hour  Intake 1637.74 ml  Output 3000 ml  Net -1362.26 ml   Filed Weights   05/23/22 0602 05/24/22 0311 05/25/22 0332  Weight: 112.1 kg 108 kg 111.8 kg    Examination:  General: No acute distress. Cardiovascular: RRR Lungs: continued wheezing, some transmitted upper airway sounds Abdomen: Soft, nontender, nondistended  Neurological: Alert and oriented 3. Moves all extremities 4 with equal strength. Cranial nerves II through XII grossly intact. Extremities: No clubbing or cyanosis. No edema.   Data Reviewed: I have personally reviewed following labs and imaging studies  CBC: Recent Labs  Lab 05/20/22 2240 05/21/22 0010 05/21/22 0543 05/22/22 5852 05/23/22 0515 05/24/22 0550 05/25/22 0437  WBC 13.4*  --  11.6* 10.6* 10.5 6.1 6.8  NEUTROABS 11.1*  --  8.1*  --   --   --  4.8  HGB 10.8*   < > 9.0* 9.9* 10.1* 10.0* 9.8*  HCT 32.4*   < > 28.6* 31.2* 31.3* 31.0* 29.8*  MCV 93.1  --  96.3 96.6 93.7 94.8 93.1  PLT 341  --  348 308 347 325 353   < > = values in this interval not displayed.    Basic Metabolic Panel: Recent Labs  Lab 05/21/22 0830 05/22/22 0607 05/23/22 0515 05/24/22 0550  05/25/22 0437  NA 140 139 137 138 132*  K 3.2* 3.8 3.4* 4.1 3.8  CL 97* 97* 94* 93* 88*  CO2 37* 33* 34* 37* 37*  GLUCOSE 107* 135* 134* 154* 151*  BUN '12 13 11 18 '$ 25*  CREATININE 0.79 0.71 0.77 0.92 0.61  CALCIUM 8.3* 8.4* 8.5* 8.1* 7.9*  MG 1.8 1.8  1.7 2.0 1.8  PHOS 2.9 2.6 3.3 4.8* 4.2    GFR: Estimated Creatinine Clearance: 98.3 mL/min (by C-G formula based on SCr of 0.61 mg/dL).  Liver Function Tests: Recent Labs  Lab 05/22/22 0607 05/23/22 0515 05/23/22 1802 05/24/22 0550 05/25/22 0437  AST  --   --  52*  --  40  ALT  --   --  70*  --  50*  ALKPHOS  --   --  57  --  48  BILITOT  --   --  0.4  --  0.3  PROT  --   --  7.2  --  6.1*  ALBUMIN 2.2* 2.3* 2.6* 2.2* 2.1*    CBG: Recent Labs  Lab 05/24/22 0800 05/24/22 1546 05/24/22 2122  GLUCAP 133* 133* 147*     Recent Results (from the past 240 hour(s))  Resp panel by RT-PCR (RSV, Flu Nixon Kolton&B, Covid) Anterior Nasal Swab     Status: None   Collection Time: 05/20/22 10:08 PM   Specimen: Anterior Nasal Swab  Result Value Ref Range Status   SARS Coronavirus 2 by RT PCR NEGATIVE NEGATIVE Final    Comment: (NOTE) SARS-CoV-2 target nucleic acids are NOT DETECTED.  The SARS-CoV-2 RNA is generally detectable in upper respiratory specimens during the acute phase of infection. The lowest concentration of SARS-CoV-2 viral copies this assay can detect is 138 copies/mL. Tynslee Bowlds negative result does not preclude SARS-Cov-2 infection and should not be used as the sole basis for treatment or other patient management decisions. Yazmin Locher negative result may occur with  improper specimen collection/handling, submission of specimen other than nasopharyngeal swab, presence of viral mutation(s) within the areas targeted by this assay, and inadequate number of viral copies(<138 copies/mL). Adri Schloss negative result must be combined with clinical observations, patient history, and epidemiological information. The expected result is Negative.  Fact  Sheet for Patients:  EntrepreneurPulse.com.au  Fact Sheet for Healthcare Providers:  IncredibleEmployment.be  This test is no t yet approved or cleared by the [place] FDA and  has been authorized for detection and/or diagnosis of SARS-CoV-2 by FDA under an Emergency Use Authorization (EUA). This EUA will remain  in effect (meaning this test can be used) for the duration of the COVID-19 declaration under Section 564(b)(1) of the Act, 21 U.S.C.section 360bbb-3(b)(1), unless the authorization is terminated  or revoked sooner.       Influenza Enrrique Mierzwa by PCR NEGATIVE NEGATIVE Final   Influenza B by PCR NEGATIVE NEGATIVE Final    Comment: (NOTE) The Xpert Xpress SARS-CoV-2/FLU/RSV plus assay is intended as an aid in the diagnosis of influenza from Nasopharyngeal swab specimens and should not be used as Jozee Hammer sole basis for treatment. Nasal washings and aspirates are unacceptable for Xpert Xpress SARS-CoV-2/FLU/RSV testing.  Fact Sheet for Patients: EntrepreneurPulse.com.au  Fact Sheet for Healthcare Providers: IncredibleEmployment.be  This test is not yet approved or cleared by the [place] FDA and has been authorized for detection and/or diagnosis of SARS-CoV-2 by FDA under an Emergency Use Authorization (EUA). This EUA will remain in effect (meaning this test can be used) for the duration of the COVID-19 declaration under Section 564(b)(1) of the Act, 21 U.S.C. section 360bbb-3(b)(1), unless the authorization is terminated or revoked.     Resp Syncytial Virus by PCR NEGATIVE NEGATIVE Final    Comment: (NOTE) Fact Sheet for Patients: EntrepreneurPulse.com.au  Fact Sheet for Healthcare Providers: IncredibleEmployment.be  This test is not yet approved or cleared by the [place] FDA and has  been authorized for detection and/or diagnosis of SARS-CoV-2 by FDA under an  Emergency Use Authorization (EUA). This EUA will remain in effect (meaning this test can be used) for the duration of the COVID-19 declaration under Section 564(b)(1) of the Act, 21 U.S.C. section 360bbb-3(b)(1), unless the authorization is terminated or revoked.  Performed at [facility], [place] [address] [place], [place] [address]   Culture, blood (Routine X 2) w Reflex to ID Panel     Status: None (Preliminary result)   Collection Time: 05/21/22  5:43 AM   Specimen: BLOOD RIGHT HAND  Result Value Ref Range Status   Specimen Description BLOOD RIGHT HAND  Final   Special Requests   Final    BOTTLES DRAWN AEROBIC AND ANAEROBIC Blood Culture adequate volume   Culture   Final    NO GROWTH 4 DAYS Performed at [facility], [place] [address], [place], [place] [address]    Report Status PENDING  Incomplete  Culture, blood (Routine X 2) w Reflex to ID Panel     Status: None (Preliminary result)   Collection Time: 05/21/22  5:44 AM   Specimen: BLOOD LEFT FOREARM  Result Value Ref Range Status   Specimen Description BLOOD LEFT FOREARM  Final   Special Requests   Final    BOTTLES DRAWN AEROBIC AND ANAEROBIC Blood Culture adequate volume   Culture   Final    NO GROWTH 4 DAYS Performed at [facility], [place] [address] [place], [place] [address]    Report Status PENDING  Incomplete  MRSA Next Gen by PCR, Nasal     Status: Abnormal   Collection Time: 05/21/22  6:49 PM   Specimen: Nasal Mucosa; Nasal Swab  Result Value Ref Range Status   MRSA by PCR Next Gen DETECTED ([place]) NOT DETECTED Final    Comment: RESULT CALLED TO, READ BACK BY AND VERIFIED WITH:  C/ MIKE N., RN 05/21/22 2050 Billijo Dilling. [place] (NOTE) The GeneXpert MRSA Assay (FDA approved for NASAL specimens only), is one component of [facility] comprehensive MRSA colonization surveillance program. It is not intended to diagnose MRSA infection nor to guide or monitor treatment for MRSA infections. Test performance is not FDA  approved in patients less than 105 years old. Performed at [facility], [place] [address] [place],  [address]          Radiology Studies: No results found.      Scheduled Meds:  arformoterol  15 mcg Nebulization BID   aspirin EC  81 mg Oral Daily   atorvastatin  40 mg Oral Daily   budesonide (PULMICORT) nebulizer solution  0.5 mg Nebulization BID   carvedilol  10 mg Oral Daily   Chlorhexidine Gluconate Cloth  6 each Topical Daily   divalproex  1,000 mg Oral QHS   dorzolamide  1 drop Right Eye BID   furosemide  60 mg Intravenous Q12H   gabapentin  300 mg Oral BID   Gerhardt's butt cream   Topical BID   guaiFENesin  600 mg Oral BID   lactulose  20 g Oral BID   latanoprost  1 drop Right Eye BID   melatonin  10 mg Oral QHS   methylPREDNISolone (SOLU-MEDROL) injection  40 mg Intravenous Q12H   mupirocin ointment  1 Application Nasal BID   nystatin   Topical BID   pantoprazole  40 mg Oral BID   QUEtiapine  200 mg Oral QHS   revefenacin  175 mcg  Nebulization Daily   sodium chloride flush  3 mL Intravenous Q12H   tamsulosin  0.4 mg Oral QHS   timolol  1 drop Right Eye BID   Continuous Infusions:  sodium chloride Stopped (05/21/22 2004)    ceFAZolin (ANCEF) IV 2 g (05/25/22 1249)     LOS: 4 days    Time spent: over 30 min    Fayrene Helper, MD [facility]   To contact the attending provider between 7A-7P or the covering provider during after hours 7P-7A, please log into the web site [email or web address] and access using universal [place] password for that web site. If you do not have the password, please call the hospital operator.  05/25/2022, 5:56 PM

## 2022-05-27 DIAGNOSIS — J9621 Acute and chronic respiratory failure with hypoxia: Secondary | ICD-10-CM | POA: Diagnosis not present

## 2022-05-27 LAB — COMPREHENSIVE METABOLIC PANEL
ALT: 47 U/L — ABNORMAL HIGH (ref 0–44)
AST: 35 U/L (ref 15–41)
Albumin: 2.6 g/dL — ABNORMAL LOW (ref 3.5–5.0)
Alkaline Phosphatase: 54 U/L (ref 38–126)
Anion gap: 11 (ref 5–15)
BUN: 12 mg/dL (ref 8–23)
CO2: 36 mmol/L — ABNORMAL HIGH (ref 22–32)
Calcium: 8.8 mg/dL — ABNORMAL LOW (ref 8.9–10.3)
Chloride: 91 mmol/L — ABNORMAL LOW (ref 98–111)
Creatinine, Ser: 0.71 mg/dL (ref 0.61–1.24)
GFR, Estimated: >60 mL/min (ref >60)
Glucose, Bld: 144 mg/dL — ABNORMAL HIGH (ref 70–99)
Potassium: 3.7 mmol/L (ref 3.5–5.1)
Sodium: 138 mmol/L (ref 135–145)
Total Bilirubin: 0.5 mg/dL (ref 0.3–1.2)
Total Protein: 7 g/dL (ref 6.5–8.1)

## 2022-05-27 LAB — CBC WITH DIFFERENTIAL/PLATELET
Abs Immature Granulocytes: 0.04 10*3/uL (ref 0.00–0.07)
Basophils Absolute: 0 10*3/uL (ref 0.0–0.1)
Basophils Relative: 0 %
Eosinophils Absolute: 0 10*3/uL (ref 0.0–0.5)
Eosinophils Relative: 0 %
HCT: 35.7 % — ABNORMAL LOW (ref 39.0–52.0)
Hemoglobin: 12.2 g/dL — ABNORMAL LOW (ref 13.0–17.0)
Immature Granulocytes: 0 %
Lymphocytes Relative: 18 %
Lymphs Abs: 1.7 10*3/uL (ref 0.7–4.0)
MCH: 30.3 pg (ref 26.0–34.0)
MCHC: 34.2 g/dL (ref 30.0–36.0)
MCV: 88.8 fL (ref 80.0–100.0)
Monocytes Absolute: 0.9 10*3/uL (ref 0.1–1.0)
Monocytes Relative: 10 %
Neutro Abs: 6.5 10*3/uL (ref 1.7–7.7)
Neutrophils Relative %: 72 %
Platelets: 481 10*3/uL — ABNORMAL HIGH (ref 150–400)
RBC: 4.02 MIL/uL — ABNORMAL LOW (ref 4.22–5.81)
RDW: 12.8 % (ref 11.5–15.5)
WBC: 9.1 10*3/uL (ref 4.0–10.5)
nRBC: 0 % (ref 0.0–0.2)

## 2022-05-27 LAB — PHOSPHORUS: Phosphorus: 2.5 mg/dL (ref 2.5–4.6)

## 2022-05-27 LAB — MAGNESIUM: Magnesium: 1.8 mg/dL (ref 1.7–2.4)

## 2022-05-27 MED ORDER — QUETIAPINE FUMARATE 200 MG PO TABS: 200.0000 mg | ORAL_TABLET | Freq: Every day | ORAL | Status: AC

## 2022-05-27 MED ORDER — PREDNISONE 10 MG PO TABS
ORAL_TABLET | ORAL | 0 refills | Status: AC
  Filled 2022-05-27: qty 30, 12d supply, fill #0

## 2022-05-27 MED ORDER — TRELEGY ELLIPTA 100-62.5-25 MCG/ACT IN AEPB: 1.0000 | INHALATION_SPRAY | Freq: Every day | RESPIRATORY_TRACT | 1 refills | Status: AC

## 2022-05-27 MED ORDER — LACTULOSE 10 GM/15ML PO SOLN: 20.0000 g | Freq: Two times a day (BID) | ORAL | 0 refills | Status: AC

## 2022-05-27 MED ORDER — ALBUTEROL SULFATE (2.5 MG/3ML) 0.083% IN NEBU: 2.5000 mg | INHALATION_SOLUTION | RESPIRATORY_TRACT | 2 refills | Status: AC | PRN

## 2022-05-27 MED ORDER — LINEZOLID 600 MG PO TABS: 600.0000 mg | ORAL_TABLET | Freq: Two times a day (BID) | ORAL | 0 refills | Status: AC

## 2022-05-27 MED ORDER — FUROSEMIDE 20 MG PO TABS: 60.0000 mg | ORAL_TABLET | Freq: Every day | ORAL | 0 refills | Status: AC

## 2022-05-27 NOTE — Plan of Care (Signed)
  Problem: Health Behavior/Discharge Planning: Goal: Ability to manage health-related needs will improve Outcome: Progressing   Problem: Clinical Measurements: Goal: Respiratory complications will improve Outcome: Progressing   Problem: Clinical Measurements: Goal: Cardiovascular complication will be avoided Outcome: Progressing   Problem: Activity: Goal: Risk for activity intolerance will decrease Outcome: Progressing   Problem: Safety: Goal: Ability to remain free from injury will improve Outcome: Progressing   Problem: Skin Integrity: Goal: Risk for impaired skin integrity will decrease Outcome: Progressing

## 2022-05-27 NOTE — Progress Notes (Signed)
PICC line removed per order with no complications.  Pressure held to achieve hemostasis.  Vaseline/gauze/tegaderm applied.  Pt education provided including aftercare instructions, signs of infection, lifting limitations, and staying in supine position for 30 minutes.

## 2022-05-27 NOTE — TOC Progression Note (Addendum)
Transition of Care (TOC) - Progression Note    Patient Details  Name: Douglas Perkins. MRN: [id number] Date of Birth: August 16, 1945  Transition of Care [facility]) CM/SW [place], [place] Phone Number: 05/27/2022, 12:55 PM  Clinical Narrative:    Update-CSW heard back from Star, she states the new there will not be a problem with pt on the new antibiotics, MD updated.   CSW spoke with Star at [facility], advised pt's IV antibiotics have changed, she states she will have to ensure this is ok, states she will call CSW back momentarily.        Expected Discharge Plan and Services         Expected Discharge Date: 05/27/22                                     Social Determinants of Health (SDOH) Interventions SDOH Screenings   Food Insecurity: No Food Insecurity (05/22/2022)  Housing: Low Risk  (05/22/2022)  Transportation Needs: No Transportation Needs (05/22/2022)  Utilities: Not At Risk (05/22/2022)  Alcohol Screen: Low Risk  (03/03/2021)  Financial Resource Strain: Low Risk  (05/23/2022)  Tobacco Use: Low Risk  (05/20/2022)    Readmission Risk Interventions    05/17/2022    2:11 PM  Readmission Risk Prevention Plan  Transportation Screening Complete  Medication Review (RN Care Manager) Complete  PCP or Specialist appointment within 3-5 days of discharge Complete  HRI or [place] Complete  SW Recovery Care/Counseling Consult Complete  Palliative Care Screening Not [place] Complete

## 2022-05-27 NOTE — Plan of Care (Signed)
  Problem: Health Behavior/Discharge Planning: Goal: Ability to manage health-related needs will improve Outcome: Adequate for Discharge   Problem: Clinical Measurements: Goal: Ability to maintain clinical measurements within normal limits will improve Outcome: Adequate for Discharge Goal: Will remain free from infection Outcome: Adequate for Discharge Goal: Diagnostic test results will improve Outcome: Adequate for Discharge Goal: Respiratory complications will improve Outcome: Adequate for Discharge Goal: Cardiovascular complication will be avoided Outcome: Adequate for Discharge   Problem: Activity: Goal: Risk for activity intolerance will decrease Outcome: Adequate for Discharge   Problem: Nutrition: Goal: Adequate nutrition will be maintained Outcome: Adequate for Discharge   Problem: Coping: Goal: Level of anxiety will decrease Outcome: Adequate for Discharge   Problem: Elimination: Goal: Will not experience complications related to bowel motility Outcome: Adequate for Discharge Goal: Will not experience complications related to urinary retention Outcome: Adequate for Discharge   Problem: Pain Managment: Goal: General experience of comfort will improve Outcome: Adequate for Discharge   Problem: Safety: Goal: Ability to remain free from injury will improve Outcome: Adequate for Discharge   Problem: Skin Integrity: Goal: Risk for impaired skin integrity will decrease Outcome: Adequate for Discharge   Problem: Education: Goal: Ability to demonstrate management of disease process will improve Outcome: Adequate for Discharge Goal: Ability to verbalize understanding of medication therapies will improve Outcome: Adequate for Discharge Goal: Individualized Educational Video(s) Outcome: Adequate for Discharge   Problem: Activity: Goal: Capacity to carry out activities will improve Outcome: Adequate for Discharge   Problem: Cardiac: Goal: Ability to achieve and  maintain adequate cardiopulmonary perfusion will improve Outcome: Adequate for Discharge   Problem: Activity: Goal: Ability to tolerate increased activity will improve Outcome: Adequate for Discharge   Problem: Clinical Measurements: Goal: Ability to maintain a body temperature in the normal range will improve Outcome: Adequate for Discharge   Problem: Respiratory: Goal: Ability to maintain adequate ventilation will improve Outcome: Adequate for Discharge Goal: Ability to maintain a clear airway will improve Outcome: Adequate for Discharge

## 2022-05-27 NOTE — Progress Notes (Signed)
Arrived to room to d/c PICC line.  Per primary RN and CSW, leave PICC in place for now pending placement details regarding medication.  Another consult to be placed when ready for PICC removal.

## 2022-05-27 NOTE — Discharge Summary (Signed)
Physician Discharge Summary  [name]. 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 Follow volume status/lytes/creatinine on higher dose lasix at discharge Follow repeat chest imaging as an outpatient   Follow with infectious disease outpatient as scheduled Follow blood sugars outpatient, needs A1c Needs his remote for the inspire device for his OSA  Discharge Diagnoses:  Principal Problem:   Acute on chronic respiratory failure with hypoxemia ([place]) Active Problems:   Benign essential hypertension   Hyperlipidemia, mixed   Bilateral pneumonia   Bipolar I disorder ([place])   Coronary artery disease involving native coronary artery of native heart   Obesity (BMI 30-39.9)   Cardiac pacemaker   History of Merkel cell carcinoma   History of sinoatrial node dysfunction   OSA (obstructive sleep apnea)   Infection of pacemaker lead wire (HCC)   Bacteremia due to group B Streptococcus   Acute on chronic diastolic CHF (congestive heart failure) (HCC)   Physical deconditioning   Morbid obesity ([place])   Acute encephalopathy   Discharge Condition: stable  Diet recommendation: heart healthy  Filed Weights   05/25/22 0332 05/26/22 0529 05/27/22 0443  Weight: 111.8 kg 109.3 kg 110.2 kg    History of present illness:  77 year old M with PMH of diastolic CHF, hypoxic RF on 4 L, SSS/PPM, HTN, OSA with inspire device, morbid obesity, Merkel cell cancer s/p radiation to right leg, bipolar disorder, BPH, hospitalization from 12/10-22 for GBS bacteremia and possible PPM infection and another hospitalization from 1/11-1/18 for CHF exacerbation and multifocal pneumonia returning from SNF with shortness of breath and hypoxemia to 80s and admitted for acute respiratory failure  with hypoxia due to acute diastolic CHF and possible bilateral pneumonia.  Patient was hypoxic with significant work of breathing while in ED and started on BiPAP.  BNP elevated to 1662.  Troponin 81.  WBC 13.4.  VBG suggestive of metabolic alkalosis.  CXR with cardiomegaly, pulmonary vascular congestion and increased airspace opacities in the lungs bilaterally with improvement in RUL.  D-dimer slightly elevated to 1.38.  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  Blood cultures obtained.  Patient was started on IV Lasix, BiPAP and broad-spectrum antibiotics and admitted.   The next day, lower extremity venous Doppler negative for DVT.  CTA chest negative for PE but patchy bilateral airspace disease involving all lobes of both lungs suggesting multifocal pneumonia, small to moderate right-sided and small left pleural effusion.   Patient's breathing improved.  He was weaned to 4 L by nasal cannula.  Blood cultures NGTD.  No fever or leukocytosis.  Pro-Cal remains negative.  Active pneumonia felt to be less likely.  Antibiotics de-escalated to IV Ancef by ID to minimize fluid overload.  Remains on IV Lasix.  Consider increasing home diuretics on discharge.  He's currently on room air.  Still has some wheezing and upper airway sounds, but overall improved.  Plan to discharge on steroids/lasix with outpatient follow up with pulmonology for COPD and tracheobronchomalacia.  He'll need cards follow up for his heart failure.  ID follow up as well.   See below for additional details  Hospital Course:  Assessment and Plan: Acute on chronic respiratory  failure with hypoxemia: on 3 L on discharge 1/18.  Most likely due to CHF versus pneumonia.  BNP elevated to 1660 (higher than baseline).  CXR suggested CHF.  CT findings  likely from recent pneumonia versus active infection.  CTA chest negative for PE.  Hospital from 1/11-1/18 for CHF and multifocal pneumonia.  At that time, he was diuresed with IV Lasix 60 mg twice daily and discharged on p.o. Lasix 20 mg daily and 4 L oxygen.  Continue lasix.  Renal function and BP stable.  -currently on room air  -Developed contraction alkalosis.  transitioned to PO lasix 60 mg daily with good output.  Will d/c with this. -Appreciate help by ID (ok with transitioning to cefazolin or linezolid - see 1/22 note from Dr. Gale Journey) -Strict intake and output, daily weight, renal functions and electrolytes -he's been weaned to room air -PT/OT-recommended SNF.   COPD Exacerbation - continue steroids, d/c with taper - pulmonology referral   Acute on chronic diastolic CHF: Limited TTE on 1/12 with LVEF of 55 to 60%, indeterminate DD -Management as above. 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  Blood cultures NGTD.  Procalcitonin negative. -Antibiotics de-escalated to IV Ancef to minimize fluid overload.  Will discharge on linezolid to complete his course.  D/c picc.   Multifocal opacity on CT chest: CTA chest negative for PE but concerning for multifocal opacity.  However, patient without fever, leukocytosis or sepsis physiology.  Pro-Cal negative.  Doubt active pneumonia or infection. -Antibiotics as above -possibly related to volume overload? He's improved after diuresis. -follow repeat chest imaging outpatient   Acute metabolic encephalopathy: somnolent this 1/23.  Improved 1/24 AM.    He is on significant dose of Seroquel.  Also at risk for CO2 retention, though he's seemed to do ok without bipap and now is on RA. -Wean oxygen as above. -Decrease Seroquel to 200 mg nightly -Check basic  encephalopathy labs including VBG (elevated PCO2, but normal pH - he was briefly on bipap, but generally has refused this - he's remained stable off bipap), TSH (wnl), ammonia, B12 (wnl)   OSA: Has implantable inspire device, but doesn't seem to have remote.  Needs to follow with primary prescribe for this. -Minimum oxygen to keep saturation above 88%.   Physical deconditioning -PT/OT   Bipolar 1 disorder: Stable. -Continue home Depakote  -Decrease Seroquel from 300 to 200 mg nightly in the setting of encephalopathy.   Elevated troponin/history of CAD: Likely demand ischemia. -Management as above.   Elevated D-dimer: D-dimer 1.38.  LE venous Doppler and CTA chest negative for VTE.   Essential hypertension:  - Coreg, lasix -lisinopril d/c'd   BPH without LUTS -Monitor urine output -Continue on Flomax   Morbid obesity Body mass index is 32.95 kg/m.   Pressure Injury 05/25/22 Heel Right Unstageable - Full thickness tissue loss in which the base of the injury is covered by slough (yellow, tan, gray, green or brown) and/or eschar (tan, brown or black) in the wound bed. self reported as POA (Active)  05/25/22 1312  Location: Heel  Location Orientation: Right  Staging: Unstageable - Full thickness tissue loss in which the base of the injury is covered by slough (yellow, tan, gray, green or brown) and/or eschar (tan, brown or black) in the wound bed.  Wound Description (Comments): self reported as POA  Present on Admission: Yes  Continue to monitor outpatient      Procedures: LE [place] Summary:  BILATERAL:  - No evidence of deep vein thrombosis seen in the lower extremities,  bilaterally.  -No evidence of popliteal cyst, bilaterally.    Echo IMPRESSIONS     1. Poor quality study. Overall, LVEF appears normal but cannot assess for  wall motion. Limited evaluation of valvular structures due to poor  windows. Suggest TEE if there clinical concerns for endocarditis.   2. Left  ventricular ejection fraction, by estimation, is 55 to 60%. The  left ventricle has normal function. Left ventricular endocardial border  not optimally defined to evaluate regional wall motion. Indeterminate  diastolic filling due to E-Esgar Barnick fusion.   3. Right ventricular systolic function was not well visualized. The right  ventricular size is not well visualized.   4. The mitral valve was not well visualized.   5. The aortic valve was not well visualized.   6. The inferior vena cava is dilated in size with <50% respiratory  variability, suggesting right atrial pressure of 15 mmHg.   Consultations: ID  Discharge Exam: Vitals:   05/27/22 0750 05/27/22 0808  BP: 129/75   Pulse: 81   Resp: 20   Temp: 98.1 F (36.7 C)   SpO2: 91% 92%   No complaints today Eager to discharge if able  General: No acute distress. Cardiovascular: RRR Lungs: pursed lip breathing/autopeeping, this has been consistent since I've seen him (and he notes it's chronic for him), continued scattered diffuse wheezing/transmitted upper airway sounds which has improved overall - now on room air Abdomen: Soft, nontender, nondistended Neurological: Alert and oriented 3. Moves all extremities . Cranial nerves II through XII grossly intact. Extremities: No clubbing or cyanosis. No edema. Discharge Instructions   Discharge Instructions     (HEART FAILURE PATIENTS) Call MD:  Anytime you have any of the following symptoms: 1) 3 pound weight gain in 24 hours or 5 pounds in 1 week 2) shortness of breath, with or without Calinda Stockinger dry hacking cough 3) swelling in the hands, feet or stomach 4) if you have to sleep on extra pillows at night in order to breathe.   Complete by: As directed    Ambulatory referral to Pulmonology   Complete by: As directed    tracheobronchomalacia   Reason for referral: Asthma/COPD   Call MD for:  difficulty breathing, headache or visual disturbances   Complete by: As directed    Call MD for:   extreme fatigue   Complete by: As directed    Call MD for:  hives   Complete by: As directed    Call MD for:  persistant dizziness or light-headedness   Complete by: As directed    Call MD for:  persistant nausea and vomiting   Complete by: As directed    Call MD for:  redness, tenderness, or signs of infection (pain, swelling, redness, odor or green/yellow discharge around incision site)   Complete by: As directed    Call MD for:  severe uncontrolled pain   Complete by: As directed    Call MD for:  temperature >100.4   Complete by: As directed    Diet - low sodium heart healthy   Complete by: As directed    Discharge instructions   Complete by: As directed    You were seen for heart failure and Jahmire Ruffins COPD exacerbation.  We've treated you with lasix for volume overload.  You should follow  up with cardiology outpatient.  For your COPD, we've started you on trelegy ellipta.  Continue albuterol as needed for shortness of breath.  I'll send you with Laporcha Marchesi steroid taper as well for your exacerbation.  You should follow with pulmonology outpatient.  Repeat chest imaging with your PCP outpatient.  You're currently not requiring any supplemental oxygen.  Follow your O2 needs outpatient with your PCP.  For your infection, we're switching you to oral antibiotics at discharge.  You'll finish these on 1/29.  Follow up with infectious disease as planned.  Continue to use your inspire device.  You'll need to follow up with your prescribing provider to see if you can get everything you need for this.  We decreased your seroquel to 200 mg daily due to sedation.  Return for new, recurrent, or worsening symptoms.  Please ask your PCP to request records from this hospitalization so they know what was done and what the next steps will be.   Discharge wound care:   Complete by: As directed    Continue to monitor and offload sacrum and heels   Increase activity slowly   Complete by: As directed        Allergies as of 05/27/2022       Reactions   Sulfa Antibiotics Hives   Feldene [piroxicam] Hives        Medication List     STOP taking these medications    dextromethorphan 7.5 MG/5ML Syrp   fluticasone-salmeterol 45-21 MCG/ACT inhaler Commonly known as: ADVAIR HFA   ipratropium-albuterol 0.5-2.5 (3) MG/3ML Soln Commonly known as: DUONEB   penicillin G  IVPB       TAKE these medications    acetaminophen 500 MG tablet Commonly known as: TYLENOL Take 1,000 mg by mouth in the morning and at bedtime. May also 2 tablets by mouth daily as needed for knee pain   albuterol 108 (90 Base) MCG/ACT inhaler Commonly known as: VENTOLIN HFA Inhale 1 puff into the lungs every 8 (eight) hours as needed for wheezing or shortness of breath. What changed: Another medication with the same name was added. Make sure you understand how and when to take each.   albuterol (2.5 MG/3ML) 0.083% nebulizer solution Commonly known as: PROVENTIL Take 3 mLs (2.5 mg total) by nebulization every 4 (four) hours as needed for wheezing or shortness of breath. What changed: You were already taking Bennie Chirico medication with the same name, and this prescription was added. Make sure you understand how and when to take each.   aspirin EC 81 MG tablet Take 1 tablet (81 mg total) by mouth daily. Reported on 08/18/2015   atorvastatin 40 MG tablet Commonly known as: LIPITOR Take 1 tablet (40 mg total) by mouth daily. What changed: when to take this   bisacodyl 5 MG EC tablet Commonly known as: DULCOLAX Take 1 tablet (5 mg total) by mouth daily as needed for moderate constipation.   carvedilol 10 MG 24 hr capsule Commonly known as: COREG CR Take 1 capsule (10 mg total) by mouth daily.   divalproex 500 MG DR tablet Commonly known as: DEPAKOTE Take 2 tablets (1,000 mg total) by mouth at bedtime.   dorzolamide 2 % ophthalmic solution Commonly known as: TRUSOPT Place 1 drop into the right eye 2 (two) times  daily.   furosemide 20 MG tablet Commonly known as: LASIX Take 3 tablets (60 mg total) by mouth daily. Start taking on: May 28, 2022 What changed: how much to take  gabapentin 300 MG capsule Commonly known as: NEURONTIN Take 1 capsule (300 mg total) by mouth 2 (two) times daily.   LACTOBACILLUS PROBIOTIC PO Take 1 capsule by mouth daily.   lactulose 10 GM/15ML solution Commonly known as: CHRONULAC Take 30 mLs (20 g total) by mouth 2 (two) times daily.   latanoprost 0.005 % ophthalmic solution Commonly known as: XALATAN Place 1 drop into the right eye 2 (two) times daily.   linezolid 600 MG tablet Commonly known as: ZYVOX Take 1 tablet (600 mg total) by mouth 2 (two) times daily for 3 days.   Melatonin 10 MG Tabs Take 10 mg by mouth at bedtime.   MULTIVITAL PO Take 1 tablet by mouth daily.   nystatin powder Generic drug: nystatin Apply 1 Application topically 2 (two) times daily. Apply topically twice Jayani Rozman day to groin region, bilateral buttocks, sacrum and inner upper thighs   pantoprazole 40 MG tablet Commonly known as: PROTONIX Take 1 tablet (40 mg total) by mouth 2 (two) times daily.   potassium chloride 10 MEQ tablet Commonly known as: KLOR-CON Take 1 tablet (10 mEq total) by mouth daily.   predniSONE 10 MG tablet Commonly known as: DELTASONE Take 4 tablets (40 mg total) by mouth daily for 3 days, THEN 3 tablets (30 mg total) daily for 3 days, THEN 2 tablets (20 mg total) daily for 3 days, THEN 1 tablet (10 mg total) daily for 3 days. Start taking on: May 27, 2022   QUEtiapine 200 MG tablet Commonly known as: SEROQUEL Take 1 tablet (200 mg total) by mouth at bedtime. What changed:  medication strength how much to take   tamsulosin 0.4 MG Caps capsule Commonly known as: FLOMAX Take 0.4 mg by mouth at bedtime.   timolol 0.5 % ophthalmic solution Commonly known as: TIMOPTIC Place 1 drop into the right eye 2 (two) times daily.   Trelegy Ellipta  100-62.5-25 MCG/ACT Aepb Generic drug: Fluticasone-Umeclidin-Vilant Inhale 1 Inhalation into the lungs daily.               Discharge Care Instructions  (From admission, onward)           Start     Ordered   05/27/22 0000  Discharge wound care:       Comments: Continue to monitor and offload sacrum and heels   05/27/22 1104           Allergies  Allergen Reactions   Sulfa Antibiotics Hives   Feldene [Piroxicam] Hives      The results of significant diagnostics from this hospitalization (including imaging, microbiology, ancillary and laboratory) are listed below for reference.    Significant Diagnostic Studies: DG Chest Port 1 View  Result Date: 05/23/2022 CLINICAL DATA:  Tachypnea. EXAM: PORTABLE CHEST 1 VIEW COMPARISON:  AP chest 05/21/2022 and 05/17/2022; CT chest 05/21/2022 FINDINGS: Left upper extremity PICC tip again overlies the superior vena cava/left brachiocephalic vein junction. Right chest wall electronic device with lead extending off of the right neck superior plane of view. Cardiac silhouette is again moderately enlarged. Moderate calcification within the aortic arch. Moderate to high-grade bilateral interstitial thickening, greatest within the left mid and lower lung where there are superimposed airspace opacifications. Heterogeneous airspace opacification within the right lower lung greater than right upper lung. Overall there is improved aeration of the right mid lung. No large pleural effusion is identified. No pneumothorax. Electronic device again overlies the lower cardiac silhouette. This again may represent an abandoned pacer lead. No acute skeletal  abnormality. IMPRESSION: Mildly improved aeration of the right mid lung with persistent moderate to high-grade bilateral interstitial thickening and left mid and lower lung and right lower lung denser airspace opacifications. Findings are again compatible with multifocal pneumonia. Electronically Signed   By:  Yvonne Kendall M.D.   On: 05/23/2022 17:37   CT Angio Chest Pulmonary Embolism (PE) W or WO Contrast  Result Date: 05/21/2022 CLINICAL DATA:  Positive D-dimer.  PE suspected. EXAM: CT ANGIOGRAPHY CHEST WITH CONTRAST TECHNIQUE: Multidetector CT imaging of the chest was performed using the standard protocol during bolus administration of intravenous contrast. Multiplanar CT image reconstructions and MIPs were obtained to evaluate the vascular anatomy. RADIATION DOSE REDUCTION: This exam was performed according to the departmental dose-optimization program which includes automated exposure control, adjustment of the mA and/or kV according to patient size and/or use of iterative reconstruction technique. CONTRAST:  84m OMNIPAQUE IOHEXOL 350 MG/ML SOLN COMPARISON:  05/21/2022 chest CT without contrast. FINDINGS: Cardiovascular: Heart size upper normal to mildly increased. Coronary artery calcification is evident. There is mild atherosclerotic calcification of the abdominal aorta without aneurysm. There is no filling defect within the opacified pulmonary arteries to suggest the presence of an acute pulmonary embolus. Left PICC line tip is positioned in the mid SVC. Mediastinum/Nodes: No mediastinal lymphadenopathy. Upper normal lymph nodes are seen in the right hilum. There is no hilar lymphadenopathy. The esophagus has normal imaging features. There is no axillary lymphadenopathy. Lungs/Pleura: Patchy bilateral airspace disease is again noted involving all lobes of both lungs. Small to moderate right and small left pleural effusions evident. Upper Abdomen: Small hepatic cysts noted. No followup imaging is recommended. Musculoskeletal: No worrisome lytic or sclerotic osseous abnormality. Review of the MIP images confirms the above findings. IMPRESSION: 1. No CT evidence for acute pulmonary embolus. 2. Patchy bilateral airspace disease involving all lobes of both lungs. Imaging features compatible with multifocal  pneumonia. 3. Small to moderate right and small left pleural effusions. 4.  Aortic Atherosclerosis (ICD10-I70.0). Electronically Signed   By: EMisty StanleyM.D.   On: 05/21/2022 15:05   VAS [place]OWER EXTREMITY VENOUS (DVT)  Result Date: 05/21/2022  Lower Venous DVT Study Patient Name:  RFreddi Schrager  Date of Exam:   05/21/2022 Medical Rec #: [id number]           Accession #:    [id number] of Birth: [birth year]           Patient Gender: M Patient Age:   89years [facility]      VAS [place]OWER EXTREMITY VENOUS (DVT) Referring Phys: TBretta BangGONFA --------------------------------------------------------------------------------  Indications: Elevated d-dimer.  Comparison Study: 04/13/22 prior Performing Technologist: MArchie PattenRVS  Examination Guidelines: Kareen Hitsman complete evaluation includes B-mode imaging, spectral Doppler, color Doppler, and power Doppler as needed of all accessible portions of each vessel. Bilateral testing is considered an integral part of Tron Flythe complete examination. Limited examinations for reoccurring indications may be performed as noted. The reflux portion of the exam is performed with the patient in reverse Trendelenburg.  +---------+---------------+---------+-----------+----------+--------------+ RIGHT    CompressibilityPhasicitySpontaneityPropertiesThrombus Aging +---------+---------------+---------+-----------+----------+--------------+ CFV      Full           Yes      Yes                                 +---------+---------------+---------+-----------+----------+--------------+ SFJ  Full                                                        +---------+---------------+---------+-----------+----------+--------------+ FV Prox  Full                                                        +---------+---------------+---------+-----------+----------+--------------+ FV Mid   Full                                                         +---------+---------------+---------+-----------+----------+--------------+ FV DistalFull                                                        +---------+---------------+---------+-----------+----------+--------------+ PFV      Full                                                        +---------+---------------+---------+-----------+----------+--------------+ POP      Full           Yes      Yes                                 +---------+---------------+---------+-----------+----------+--------------+ PTV      Full                                                        +---------+---------------+---------+-----------+----------+--------------+ PERO     Full                                                        +---------+---------------+---------+-----------+----------+--------------+   +---------+---------------+---------+-----------+----------+--------------+ LEFT     CompressibilityPhasicitySpontaneityPropertiesThrombus Aging +---------+---------------+---------+-----------+----------+--------------+ CFV      Full           Yes      Yes                                 +---------+---------------+---------+-----------+----------+--------------+ SFJ      Full                                                        +---------+---------------+---------+-----------+----------+--------------+  FV Prox  Full                                                        +---------+---------------+---------+-----------+----------+--------------+ FV Mid   Full                                                        +---------+---------------+---------+-----------+----------+--------------+ FV DistalFull                                                        +---------+---------------+---------+-----------+----------+--------------+ PFV      Full                                                         +---------+---------------+---------+-----------+----------+--------------+ POP      Full           Yes      Yes                                 +---------+---------------+---------+-----------+----------+--------------+ PTV      Full                                                        +---------+---------------+---------+-----------+----------+--------------+ PERO     Full                                                        +---------+---------------+---------+-----------+----------+--------------+     Summary: BILATERAL: - No evidence of deep vein thrombosis seen in the lower extremities, bilaterally. -No evidence of popliteal cyst, bilaterally.   *See table(s) above for measurements and observations. Electronically signed by Harold Barban MD on 05/21/2022 at 2:16:55 PM.    Final    CT CHEST WO CONTRAST  Result Date: 05/21/2022 CLINICAL DATA:  Dyspnea. Central airway disease suspected with acute on chronic respiratory failure. EXAM: CT CHEST WITHOUT CONTRAST TECHNIQUE: Multidetector CT imaging of the chest was performed following the standard protocol without IV contrast. RADIATION DOSE REDUCTION: This exam was performed according to the departmental dose-optimization program which includes automated exposure control, adjustment of the mA and/or kV according to patient size and/or use of iterative reconstruction technique. COMPARISON:  CT angio chest 04/09/2022 FINDINGS: Cardiovascular: Heart size is mildly enlarged. Aortic atherosclerosis and 3 vessel coronary artery calcification. No pericardial effusion. Abandon pacer lead identified within the right ventricle. Mediastinum/Nodes: Thyroid gland appears normal. There is scratch set the proximal trachea appears normal  in caliber. The distal trachea and right and left mainstem bronchus demonstrate significantly diminished AP diameter. Distal trachea for example measures 5 mm in AP dimension on the current exam, image 40/3. This appears  similar to the previous exam. Esophagus demonstrates no significant findings. No enlarged axillary or mediastinal lymph nodes. The hilar lymph nodes are suboptimally evaluated due to lack of IV contrast material. Lungs/Pleura: Small to moderate bilateral pleural effusions are increased in volume from the previous exam. Bilateral, upper and lower lobe areas of ground-glass and airspace consolidation are identified. New when compared with the prior examination. Upper Abdomen: No acute abnormality. Cholecystectomy. Cyst within segment 2 of the liver measures 1.5 cm. Musculoskeletal: Spondylosis identified within the thoracic spine. Remote right posterolateral tenth rib and left ninth rib fractures. IMPRESSION: 1. Interval increase in volume of bilateral pleural effusions. 2. Bilateral, upper and lower lobe areas of ground-glass and airspace consolidation are identified. Findings compatible with multifocal pneumonia and or ARDS. 3. Significant diminished AP diameter of the distal trachea and right and left mainstem bronchus. 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 4. Coronary artery calcifications. 5.  Aortic Atherosclerosis (ICD10-I70.0). Electronically Signed   By: Kerby Moors M.D.   On: 05/21/2022 07:01   DG Chest Port 1 View  Result Date: 05/21/2022 CLINICAL DATA:  Shortness of breath. EXAM: PORTABLE CHEST 1 VIEW COMPARISON:  05/17/2022. FINDINGS: The heart is enlarged and the mediastinal contour stable. There is atherosclerotic calcification of the aorta. The pulmonary vasculature is distended. Airspace opacities are noted in the right lung, improved in the right upper lobe and increased at the lung bases bilaterally. There are peripheral airspace opacities in the mid left lung. No effusion or pneumothorax. Carloyn Lahue left PICC line is stable in position. Melbert Botelho neurostimulator device is noted on the right with leads coursing over the cervical region on the right.  No acute osseous abnormality. IMPRESSION: 1. Cardiomegaly with pulmonary vascular congestion. 2. Increased airspace opacities in the lungs bilaterally with improvement in the right upper lobe. Electronically Signed   By: Brett Fairy M.D.   On: 05/21/2022 01:09   DG CHEST PORT 1 VIEW  Result Date: 05/17/2022 CLINICAL DATA:  Status post PICC placement. EXAM: PORTABLE CHEST 1 VIEW COMPARISON:  Chest radiograph 05/17/2022 at 6:56 Caillou Minus.m. FINDINGS: Gilbert Manolis left upper extremity PICC has been placed and terminates over the mid SVC. The right PICC has been removed. Tyshun Tuckerman neural stimulator is again noted with lead coursing into the right neck. The cardiac silhouette remains enlarged. Confluent airspace opacity in the right upper lobe and patchy airspace opacities elsewhere bilaterally are unchanged. No sizable pleural effusion or pneumothorax is identified. Pulmonary vascular congestion is unchanged. IMPRESSION: 1. Interval left upper extremity PICC placement which terminates in the mid SVC. 2. Unchanged bilateral airspace disease. Electronically Signed   By: Logan Bores M.D.   On: 05/17/2022 17:01   [place] EKG SITE RITE  Result Date: 05/17/2022 If Site Rite image not attached, placement could not be confirmed due to current cardiac rhythm.  DG Chest Port 1 View  Result Date: 05/17/2022 CLINICAL DATA:  Shortness of breath. EXAM: PORTABLE CHEST 1 VIEW COMPARISON:  05/14/2022 FINDINGS: Patchy bilateral airspace disease again noted, notably in the right upper lobe, left mid lung, and right base with imaging features similar to prior study. The cardio pericardial silhouette is enlarged. There is pulmonary vascular congestion without overt pulmonary edema. Battery pack for stimulator device overlies the right chest. Right PICC  line is looped on itself in the right internal jugular vein with the tip positioned in the right subclavicular region, likely in the subclavian vein. IMPRESSION: 1. Right PICC line is looped on itself in the  right internal jugular vein with the tip positioned in the right subclavicular region, likely in the subclavian vein with the tip directed peripherally. This represents Davine Sweney change from the study of 05/13/2022 when the catheter was still looped in the internal jugular vein but the tip was positioned in the proximal SVC. As such, right PICC line tip has migrated in the interval since that exam into the subclavian vein. 2. Similar appearance of patchy bilateral airspace disease. These results will be called to the ordering clinician or representative by the Radiologist Assistant, and communication documented in the PACS or [employer]. Electronically Signed   By: Misty Stanley M.D.   On: 05/17/2022 07:32   DG Swallowing Func-Speech Pathology  Result Date: 05/16/2022 Table formatting from the original result was not included. Objective Swallowing Evaluation: Type of Study: MBS-Modified Barium Swallow Study  Patient Details Name: Chrisean Kloth. 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[place])   Mural thrombus of cardiac apex   Presence of permanent cardiac pacemaker 2017  SA node dysfunction  Sleep apnea   does not use CPAP  TIA (transient ischemic attack)  Past Surgical History: Past Surgical History: Procedure Laterality Date  BUBBLE STUDY  04/14/2022  Procedure: BUBBLE STUDY;  Surgeon: Lelon Perla, MD;  Location: [place];  Service: Cardiovascular;;  CHOLECYSTECTOMY    COLONOSCOPY WITH PROPOFOL N/Tyreonna Czaplicki 01/31/2017  Procedure: COLONOSCOPY WITH PROPOFOL;  Surgeon: Manya Silvas, MD;  [facility] ENDOSCOPY;  Service: Endoscopy;  Laterality: N/Edker Punt;  DRUG INDUCED ENDOSCOPY N/Karina Lenderman 05/12/2020  Procedure: DRUG  INDUCED ENDOSCOPY;  Surgeon: Melida Quitter, MD;  Location: [place];  Service: ENT;  Laterality: N/Rodnisha Blomgren;  IMPLANTATION OF HYPOGLOSSAL NERVE STIMULATOR N/Ayham Word 07/21/2020  Procedure: IMPLANTATION OF HYPOGLOSSAL NERVE STIMULATOR;  Surgeon: Melida Quitter, MD;  Location: [place];  Service: ENT;  Laterality: N/Maeve Debord;  LEAD EXTRACTION N/Evangela Heffler 04/17/2022  Procedure: LEAD EXTRACTION;  Surgeon: Vickie Epley, MD;  Location: [place] [facility];  Service: Cardiovascular;  Laterality: N/Kenisha Lynds;  LEG SURGERY Left   distal  Pace maker placement    PROSTATE SURGERY    PROSTATECTOMY    SKIN CANCER EXCISION  2006,2007  Merkle Cell Carcinoma  TEE WITHOUT CARDIOVERSION N/Yer Olivencia 04/14/2022  Procedure: TRANSESOPHAGEAL ECHOCARDIOGRAM (TEE);  Surgeon: Lelon Perla, MD;  Location: [facility] ENDOSCOPY;  Service: Cardiovascular;  Laterality: N/Izzabella Besse; HPI: Pt is Emmalyn Hinson 77 y/o male who presented with worsening cough, poor secretion management, and shortness of breath. Admitted with multifocal PNA. Following Clarice Bonaventure recent pacemaker infection, pt was admitted to [facility] 12/22. Prior swallow eval (06/27/15) completed at another hospital was [facility] with no remarkable findings. He has Toluwanimi Radebaugh hx of prostate and merkel cell cancer, sleep apnea, and TIA. Pt uses 3L [place] at baseline.  Subjective: very HOH  Recommendations for follow up therapy are one component of Shaleka Brines multi-disciplinary discharge planning process, led by the attending physician.  Recommendations may be updated based on patient status, additional functional criteria and insurance  authorization. Assessment / Plan / Recommendation   05/16/2022   1:00 PM Clinical Impressions Clinical Impression Pt presents with Miamarie Moll mild oropharyngeal dysphagia that appears to be exacerbated by current respiratory status and mentation. Orally he sometimes plays with boluses in his mouth, rocking them with his tongue before swallowing them. He leaves mild lingual residue behind but he often swallows spontaneously to clear it. His  overall pharyngeal function is relatively intact but as he starts to become more short of breath and more impulsive, his swallow becomes less organized and timely. This leads to moments of aspiration with thin liquids (PAS 7), which are not eliminated by use of Yordy Matton chin tuck (PAS 8). When physically assisted to take single sips he has improving airway protection (PAS 2), but he can protect his airway well with nectar thick liquids (PAS 2) regardless of any pacing or external assist. Considering the above, recommend Dys 3 diet and nectar thick liquids with hopeful ability to resume thin liquids as respiratory status improves. SLP Visit Diagnosis Dysphagia, oropharyngeal phase (R13.12) Impact on safety and function Mild aspiration risk     05/16/2022   1:00 PM Treatment Recommendations Treatment Recommendations Therapy as outlined in treatment plan below     05/16/2022   1:00 PM Prognosis Prognosis for Safe Diet Advancement Good Barriers to Reach Goals Cognitive deficits;Other (Comment)   05/16/2022   1:00 PM Diet Recommendations SLP Diet Recommendations Dysphagia 3 (Mech soft) solids;Nectar thick liquid Liquid Administration via Cup;Straw Medication Administration Whole meds with puree Compensations Slow rate;Small sips/bites;Other (Comment) Postural Changes Seated upright at 90 degrees     05/16/2022   1:00 PM Other Recommendations Oral Care Recommendations Oral care BID Other Recommendations Prohibited food (jello, ice cream, thin soups);Remove water pitcher Follow Up Recommendations Skilled nursing-short term rehab (<3 hours/day) Functional Status Assessment Patient has had Ikram Riebe recent decline in their functional status and demonstrates the ability to make significant improvements in function in Gerber Penza reasonable and predictable amount of time.   05/16/2022   1:00 PM Frequency and Duration  Speech Therapy Frequency (ACUTE ONLY) min 2x/week Treatment Duration 2 weeks     05/16/2022   1:00 PM Oral Phase Oral Phase Impaired Oral -  Nectar Teaspoon WFL Oral - Nectar Cup Reduced posterior propulsion;Lingual/palatal residue;Decreased bolus cohesion Oral - Nectar Straw Delayed oral transit Oral - Thin Teaspoon WFL Oral - Thin Cup Lingual/palatal residue Oral - Thin Straw Lingual/palatal residue;Premature spillage Oral - Puree Lingual/palatal residue Oral - Regular Impaired mastication    05/16/2022   1:00 PM Pharyngeal Phase Pharyngeal Phase Impaired Pharyngeal- Nectar Teaspoon WFL Pharyngeal- Nectar Cup WFL Pharyngeal- Nectar Straw Penetration/Aspiration during swallow Pharyngeal Material enters airway, remains ABOVE vocal cords then ejected out Pharyngeal- Thin Teaspoon WFL Pharyngeal- Thin Cup Penetration/Aspiration during swallow Pharyngeal Material enters airway, remains ABOVE vocal cords then ejected out Pharyngeal- Thin Straw Penetration/Aspiration before swallow Pharyngeal Material enters airway, passes BELOW cords without attempt by patient to eject out (silent aspiration) Pharyngeal- Puree WFL Pharyngeal- Regular Reduced tongue base retraction;Pharyngeal residue - valleculae    05/16/2022   1:00 PM Cervical Esophageal Phase  Cervical Esophageal Phase [facility] [name]., M.Chelisa Hennen. [place] [facility] Office [phone] Secure chat preferred 05/16/2022, 2:35 PM                     DG Chest Port 1 View  Result Date: 05/14/2022 CLINICAL DATA:  Short of breath EXAM: PORTABLE CHEST 1 VIEW COMPARISON:  Prior chest x-ray yesterday FINDINGS: Stable cardiomegaly.  Atherosclerotic calcifications present in the transverse aorta. Generator pack overlies the right chest with electrical lead extending cephalad. Persistent pulmonary vascular congestion. Overall, slight interval improvement in aeration with decreased interstitial airspace opacities. Persistent patchy foci of airspace opacity in the right upper lung, right lung base and left mid lung. IMPRESSION: 1. Overall, slight interval improvement in aeration of the lungs bilaterally likely  reflecting Deshawn Witty decrease in the pulmonary edema component. 2. Persistent multifocal patchy airspace opacities concerning for multifocal pneumonia. 3. Stable cardiomegaly. Electronically Signed   By: Jacqulynn Cadet M.D.   On: 05/14/2022 08:29   DG Chest Port 1 View  Result Date: 05/13/2022 CLINICAL DATA:  Shortness of breath. EXAM: PORTABLE CHEST 1 VIEW COMPARISON:  05/11/2022 FINDINGS: Stable mild cardiomegaly. Right arm PICC line remains looped in the internal jugular vein, with the tip overlying the proximal SVC. Neurostimulator device again seen in the right hemithorax. Patchy bilateral pulmonary airspace disease again seen bilaterally, with mild worsening noted in the right upper lobe and left midlung. No evidence of pneumothorax or pleural effusion. IMPRESSION: Patchy bilateral pulmonary airspace disease, with mild worsening in right upper lobe and left midlung. Right arm PICC line remains looped in the internal jugular vein, with tip overlying the proximal SVC. Electronically Signed   By: Marlaine Hind M.D.   On: 05/13/2022 10:04   ECHOCARDIOGRAM LIMITED  Result Date: 05/12/2022    ECHOCARDIOGRAM LIMITED REPORT   Patient Name:   Hesston Hitchens. Date of Exam: 05/12/2022 Medical Rec #:  [id number]           Height:       72.0 in Accession #:    [id number]          Weight:       256.0 lb Date of Birth:  March 08, 1946           BSA:          2.366 m Patient Age:    62 years            BP:           129/76 mmHg Patient Gender: M                   HR:           65 bpm. Exam Location:  Inpatient Procedure: Limited Echo, Cardiac Doppler and Limited Color Doppler Indications:    Endocarditis I38  History:        Patient has prior history of Echocardiogram examinations, most                 recent 04/17/2022. Thrombus, Pacemaker, Stroke and cancer,                 Signs/Symptoms:Shortness of Breath; Risk Factors:Non-Smoker and                 Hypertension.  Sonographer:    Greer Pickerel Referring Phys: [id number]  CORNELIUS N VAN DAM  Sonographer Comments: Patient is obese. Image acquisition challenging due to respiratory motion. IMPRESSIONS  1. Poor quality study. Overall, LVEF appears normal but cannot assess for wall motion. Limited evaluation of valvular structures due to poor windows. Suggest TEE if there clinical concerns for endocarditis.  2. Left ventricular ejection fraction, by estimation, is 55 to 60%. The left ventricle has normal function. Left ventricular endocardial border not optimally defined to evaluate regional wall motion. Indeterminate diastolic filling due to E-Elbony Mcclimans fusion.  3. Right ventricular systolic function was  not well visualized. The right ventricular size is not well visualized.  4. The mitral valve was not well visualized.  5. The aortic valve was not well visualized.  6. The inferior vena cava is dilated in size with <50% respiratory variability, suggesting right atrial pressure of 15 mmHg. FINDINGS  Left Ventricle: Left ventricular ejection fraction, by estimation, is 55 to 60%. The left ventricle has normal function. Left ventricular endocardial border not optimally defined to evaluate regional wall motion. Indeterminate diastolic filling due to E-Polly Barner fusion. Right Ventricle: The right ventricular size is not well visualized. Right vetricular wall thickness was not well visualized. Right ventricular systolic function was not well visualized. Pericardium: Trivial pericardial effusion is present. Mitral Valve: The mitral valve was not well visualized. Tricuspid Valve: The tricuspid valve is not well visualized. Aortic Valve: The aortic valve was not well visualized. Pulmonic Valve: The pulmonic valve was not well visualized. Venous: The inferior vena cava is dilated in size with less than 50% respiratory variability, suggesting right atrial pressure of 15 mmHg. Additional Comments: Spectral Doppler performed. Color Doppler performed.  Eleonore Chiquito MD Electronically signed by Eleonore Chiquito MD  Signature Date/Time: 05/12/2022/3:34:18 PM    Final    DG Chest Portable 1 View  Result Date: 05/11/2022 CLINICAL DATA:  Coughing and shortness of breath. EXAM: PORTABLE CHEST 1 VIEW COMPARISON:  CTA chest 04/09/2022, AP and lateral chest 04/18/2022 FINDINGS: 3:05 Gwyn Hieronymus.m. There is either Nyellie Yetter loop recorder device or leadless pacemaker superimposing over the lower left chest. Cynthia Cogle right chest implanted battery is again noted with Yakir Wenke single wire extending up into the right neck. There is Ingeborg Fite new right PICC which loops up into the IJ vein at the level of the brachiocephalic/SVC junction then continues inferiorly into the upper most SVC. The heart is enlarged. There is mild increased central vascular prominence. Interstitial and patchy airspace opacities are present throughout the lungs, right-greater-than-left. Small pleural effusions are beginning to develop. Findings could be due to edema with asymmetry, pneumonia or combination. The mediastinum is normally outlined. There is aortic atherosclerosis. Thoracic spondylosis and osteopenia. IMPRESSION: 1. New right PICC loops up into the IJ vein at the level of the brachiocephalic/SVC junction then continues inferiorly terminating in the upper most SVC. 2. Cardiomegaly with mild increased central vascular prominence. 3. Interstitial and patchy airspace opacities throughout the lungs, right-greater-than-left. Findings could be due to edema with asymmetry, pneumonia or combination. 4. Small pleural effusions. 5. Aortic atherosclerosis. 6. Clinical correlation and radiographic follow-up recommended. Electronically Signed   By: Telford Nab M.D.   On: 05/11/2022 03:25    Microbiology: Recent Results (from the past 240 hour(s))  Resp panel by RT-PCR (RSV, Flu Dequann Vandervelden&B, Covid) Anterior Nasal Swab     Status: None   Collection Time: 05/20/22 10:08 PM   Specimen: Anterior Nasal Swab  Result Value Ref Range Status   SARS Coronavirus 2 by RT PCR NEGATIVE NEGATIVE Final    Comment:  (NOTE) SARS-CoV-2 target nucleic acids are NOT DETECTED.  The SARS-CoV-2 RNA is generally detectable in upper respiratory specimens during the acute phase of infection. The lowest concentration of SARS-CoV-2 viral copies this assay can detect is 138 copies/mL. Valoria Tamburri negative result does not preclude SARS-Cov-2 infection and should not be used as the sole basis for treatment or other patient management decisions. Yameli Delamater negative result may occur with  improper specimen collection/handling, submission of specimen other than nasopharyngeal swab, presence of viral mutation(s) within the areas targeted by this assay, and inadequate  number of viral copies(<138 copies/mL). Cambren Helm negative result must be combined with clinical observations, patient history, and epidemiological information. The expected result is Negative.  Fact Sheet for Patients:  EntrepreneurPulse.com.au  Fact Sheet for Healthcare Providers:  IncredibleEmployment.be  This test is no t yet approved or cleared by the [place] FDA and  has been authorized for detection and/or diagnosis of SARS-CoV-2 by FDA under an Emergency Use Authorization (EUA). This EUA will remain  in effect (meaning this test can be used) for the duration of the COVID-19 declaration under Section 564(b)(1) of the Act, 21 U.S.C.section 360bbb-3(b)(1), unless the authorization is terminated  or revoked sooner.       Influenza Chelan Heringer by PCR NEGATIVE NEGATIVE Final   Influenza B by PCR NEGATIVE NEGATIVE Final    Comment: (NOTE) The Xpert Xpress SARS-CoV-2/FLU/RSV plus assay is intended as an aid in the diagnosis of influenza from Nasopharyngeal swab specimens and should not be used as Jayni Prescher sole basis for treatment. Nasal washings and aspirates are unacceptable for Xpert Xpress SARS-CoV-2/FLU/RSV testing.  Fact Sheet for Patients: EntrepreneurPulse.com.au  Fact Sheet for Healthcare  Providers: IncredibleEmployment.be  This test is not yet approved or cleared by the [place] FDA and has been authorized for detection and/or diagnosis of SARS-CoV-2 by FDA under an Emergency Use Authorization (EUA). This EUA will remain in effect (meaning this test can be used) for the duration of the COVID-19 declaration under Section 564(b)(1) of the Act, 21 U.S.C. section 360bbb-3(b)(1), unless the authorization is terminated or revoked.     Resp Syncytial Virus by PCR NEGATIVE NEGATIVE Final    Comment: (NOTE) Fact Sheet for Patients: EntrepreneurPulse.com.au  Fact Sheet for Healthcare Providers: IncredibleEmployment.be  This test is not yet approved or cleared by the [place] FDA and has been authorized for detection and/or diagnosis of SARS-CoV-2 by FDA under an Emergency Use Authorization (EUA). This EUA will remain in effect (meaning this test can be used) for the duration of the COVID-19 declaration under Section 564(b)(1) of the Act, 21 U.S.C. section 360bbb-3(b)(1), unless the authorization is terminated or revoked.  Performed at [facility], [place] [address], [place], [place] [address]   Culture, blood (Routine X 2) w Reflex to ID Panel     Status: None   Collection Time: 05/21/22  5:43 AM   Specimen: BLOOD RIGHT HAND  Result Value Ref Range Status   Specimen Description BLOOD RIGHT HAND  Final   Special Requests   Final    BOTTLES DRAWN AEROBIC AND ANAEROBIC Blood Culture adequate volume   Culture   Final    NO GROWTH 5 DAYS Performed at [facility], [place] [address] [place], [place] [address]    Report Status 05/26/2022 FINAL  Final  Culture, blood (Routine X 2) w Reflex to ID Panel     Status: None   Collection Time: 05/21/22  5:44 AM   Specimen: BLOOD LEFT FOREARM  Result Value Ref Range Status   Specimen Description BLOOD LEFT FOREARM  Final   Special Requests   Final     BOTTLES DRAWN AEROBIC AND ANAEROBIC Blood Culture adequate volume   Culture   Final    NO GROWTH 5 DAYS Performed at [facility], [place] [address], [place], [place] [address]    Report Status 05/26/2022 FINAL  Final  MRSA Next Gen by PCR, Nasal     Status: Abnormal   Collection Time: 05/21/22  6:49 PM   Specimen: Nasal  Mucosa; Nasal Swab  Result Value Ref Range Status   MRSA by PCR Next Gen DETECTED (Yostin Malacara) NOT DETECTED Final    Comment: RESULT CALLED TO, READ BACK BY AND VERIFIED WITH:  C/ MIKE N., RN 05/21/22 2050 Donica Derouin. [place] (NOTE) The GeneXpert MRSA Assay (FDA approved for NASAL specimens only), is one component of Parsa Rickett comprehensive MRSA colonization surveillance program. It is not intended to diagnose MRSA infection nor to guide or monitor treatment for MRSA infections. Test performance is not FDA approved in patients less than 71 years old. Performed at [facility], [place] [address] [place], [place] [address]      Labs: Basic Metabolic Panel: Recent Labs  Lab 05/23/22 0515 05/24/22 0550 05/25/22 0437 05/26/22 0413 05/27/22 0650  NA 137 138 132* 134* 138  K 3.4* 4.1 3.8 3.7 3.7  CL 94* 93* 88* 88* 91*  CO2 34* 37* 37* 38* 36*  GLUCOSE 134* 154* 151* 151* 144*  BUN 11 18 25* 15 12  CREATININE 0.77 0.92 0.61 0.65 0.71  CALCIUM 8.5* 8.1* 7.9* 8.1* 8.8*  MG 1.7 2.0 1.8 1.8 1.8  PHOS 3.3 4.8* 4.2 3.1 2.5   Liver Function Tests: Recent Labs  Lab 05/23/22 1802 05/24/22 0550 05/25/22 0437 05/26/22 0413 05/27/22 0650  AST 52*  --  40 41 35  ALT 70*  --  50* 49* 47*  ALKPHOS 57  --  48 47 54  BILITOT 0.4  --  0.3 0.3 0.5  PROT 7.2  --  6.1* 6.2* 7.0  ALBUMIN 2.6* 2.2* 2.1* 2.2* 2.6*   No results for input(s): "LIPASE", "AMYLASE" in the last 168 hours. Recent Labs  Lab 05/23/22 1158 05/24/22 0550  AMMONIA 43* 37*   CBC: Recent Labs  Lab 05/20/22 2240 05/21/22 0010 05/21/22 0543 05/22/22 3710 05/23/22 0515 05/24/22 0550 05/25/22 0437  05/26/22 0413 05/27/22 0650  WBC 13.4*  --  11.6*   < > 10.5 6.1 6.8 7.7 9.1  NEUTROABS 11.1*  --  8.1*  --   --   --  4.8 5.6 6.5  HGB 10.8*   < > 9.0*   < > 10.1* 10.0* 9.8* 10.4* 12.2*  HCT 32.4*   < > 28.6*   < > 31.3* 31.0* 29.8* 31.7* 35.7*  MCV 93.1  --  96.3   < > 93.7 94.8 93.1 92.4 88.8  PLT 341  --  348   < > 347 325 353 402* 481*   < > = values in this interval not displayed.   Cardiac Enzymes: No results for input(s): "CKTOTAL", "CKMB", "CKMBINDEX", "TROPONINI" in the last 168 hours. BNP: BNP (last 3 results) Recent Labs    05/23/22 0515 05/24/22 0550 05/25/22 0437  BNP 1,054.5* 1,519.5* 377.2*    ProBNP (last 3 results) No results for input(s): "PROBNP" in the last 8760 hours.  CBG: Recent Labs  Lab 05/24/22 0800 05/24/22 1546 05/24/22 2122  GLUCAP 133* 133* 147*       Signed:  Fayrene Helper MD.  [facility] 05/27/2022, 11:14 AM

## 2022-05-27 NOTE — TOC Transition Note (Signed)
Transition of Care [facility]) - CM/SW Discharge Note   Patient Details  Name: Douglas Perkins. MRN: [id number] Date of Birth: 23-Jun-1945  Transition of Care [facility]) CM/SW Contact:  Loreta [address], [place] Phone Number: 05/27/2022, 1:49 PM   Clinical Narrative:     Patient will DC to: [facility]  Anticipated DC date: 05/27/22 Family notified: Spouse-Frances Transport by: Corey Harold    Per MD patient ready for DC to [facility]. RN to call report prior to discharge [phone]. RN, patient, patient's family, and facility notified of DC. Discharge Summary and FL2 sent to facility. DC packet on chart. Ambulance transport requested for patient.   CSW will sign off for now as social work intervention is no longer needed. Please consult [place] again if new needs arise.          Patient Goals and CMS Choice      Discharge Placement                         Discharge Plan and Services Additional resources added to the After Visit Summary for                                       Social Determinants of Health (SDOH) Interventions SDOH Screenings   Food Insecurity: No Food Insecurity (05/22/2022)  Housing: Low Risk  (05/22/2022)  Transportation Needs: No Transportation Needs (05/22/2022)  Utilities: Not At Risk (05/22/2022)  Alcohol Screen: Low Risk  (03/03/2021)  Financial Resource Strain: Low Risk  (05/23/2022)  Tobacco Use: Low Risk  (05/20/2022)     Readmission Risk Interventions    05/17/2022    2:11 PM  Readmission Risk Prevention Plan  Transportation Screening Complete  Medication Review (RN Care Manager) Complete  PCP or Specialist appointment within 3-5 days of discharge Complete  HRI or [place] Complete  SW Recovery Care/Counseling Consult Complete  Palliative Care Screening Not [place] Complete

## 2022-05-27 NOTE — Progress Notes (Signed)
Attempted to call and give report. Initially was placed on hold x 8 min then got cut off. Attempted another call and placed on hold; phone was ringing but nobody was answering  for 2 min.

## 2022-05-29 ENCOUNTER — Other Ambulatory Visit (HOSPITAL_COMMUNITY): Payer: Self-pay

## 2022-06-06 ENCOUNTER — Telehealth: Payer: Self-pay

## 2022-06-06 NOTE — Telephone Encounter (Signed)
Returned Jessica's call from [place] [facility] in regards to pt's appt. She handles scheduling. Due to transportation that day she was needing to resched pt's appt. Called back and left VM.

## 2022-06-12 ENCOUNTER — Ambulatory Visit: Payer: TRICARE For Life (TFL) | Admitting: Internal Medicine

## 2022-06-12 ENCOUNTER — Encounter: Payer: Self-pay | Admitting: Pulmonary Disease

## 2022-06-12 ENCOUNTER — Ambulatory Visit (INDEPENDENT_AMBULATORY_CARE_PROVIDER_SITE_OTHER): Payer: Medicare Other

## 2022-06-12 ENCOUNTER — Ambulatory Visit (INDEPENDENT_AMBULATORY_CARE_PROVIDER_SITE_OTHER): Payer: Medicare Other | Admitting: Pulmonary Disease

## 2022-06-12 VITALS — BP 126/72 | HR 86

## 2022-06-12 DIAGNOSIS — J189 Pneumonia, unspecified organism: Secondary | ICD-10-CM

## 2022-06-12 DIAGNOSIS — J9621 Acute and chronic respiratory failure with hypoxia: Secondary | ICD-10-CM | POA: Diagnosis not present

## 2022-06-12 DIAGNOSIS — G4733 Obstructive sleep apnea (adult) (pediatric): Secondary | ICD-10-CM

## 2022-06-12 NOTE — Patient Instructions (Signed)
Continue trelegy ellipta 1 puff daily - rinse mouth out after each use  Continue to use oxygen 2L at night  We will check your oxygen on room air today at rest  We will check your oxygen levels when sleeping via overnight oximetry test on room air  We will check a chest x-ray today, based on the results we will consider a CT Chest scan.   Follow up in 1 month with pulmonary function tests

## 2022-06-12 NOTE — Progress Notes (Signed)
Synopsis: Referred in February 2024 for COPD   Subjective:   PATIENT ID: Douglas Perkins. GENDER: male DOB: 1945/05/12, MRN: [id number]  HPI  Chief Complaint  Patient presents with   Consult    HFU. States he has bee doing well since being discharged. Currently on 2L of O2.    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   He was hospitalized from 12/10-22 for GBS bacteremia and possible PPM infection and another hospitalization from 1/11-1/18 for CHF exacerbation and multifocal pneumonia and again 1/21 to 1/27 for heart failure exacerbation and pneumonia.  He is currently using 2L of oxygen. He is receiving PT therapy at his rehab. He is feeling much better. He is accompanied by his wife.  No issues with his breathing prior to hospitalizations. He was living in independent living without issues.   He is a never smoker. No second hand smoke. 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 He did have agent orange exposure in [place].  Past Medical History:  Diagnosis Date   Bipolar 1 disorder ([place])    Cancer ([place])    prostate   Glaucoma    Hypertension    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[place])    [place] thrombus of cardiac apex    Presence of permanent cardiac pacemaker 2017   SA node dysfunction   Sleep apnea    does not use CPAP   TIA (transient ischemic attack)      Family History  Problem Relation Age of Onset   Stroke Father    CAD Paternal Grandmother    CAD Paternal Grandfather      Social History   Socioeconomic History   Marital status: Married    Spouse name: Not on file   Number of children: Not on file   Years of education: Not on file   Highest education level: Not on file  Occupational History    Occupation: disabled  Tobacco Use   Smoking status: Never   Smokeless tobacco: Never  Substance and Sexual Activity   Alcohol use: No    Alcohol/week: 0.0 standard drinks of alcohol   Drug use: No   Sexual activity: Not on file  Other Topics Concern   Not on file  Social History Narrative   Not on file   Social Determinants of Health   Financial Resource Strain: Low Risk  (05/23/2022)   Overall Financial Resource Strain (CARDIA)    Difficulty of Paying Living Expenses: Not very hard  Food Insecurity: No Food Insecurity (05/22/2022)   Hunger Vital Sign    Worried About Running Out of Food in the Last Year: Never true    Ran Out of Food in the Last Year: Never true  Transportation Needs: No Transportation Needs (05/22/2022)   PRAPARE - Hydrologist (Medical): No    Lack of Transportation (Non-Medical): No  Physical Activity: Not on file  Stress: Not on file  Social Connections: Not on file  Intimate Partner Violence: Not At Risk (05/22/2022)   Humiliation, Afraid, Rape, and Kick questionnaire    Fear of Current or Ex-Partner: No    Emotionally Abused: No    Physically Abused: No    Sexually Abused: No  Allergies  Allergen Reactions   Sulfa Antibiotics Hives   Feldene [Piroxicam] Hives     Outpatient Medications Prior to Visit  Medication Sig Dispense Refill   acetaminophen (TYLENOL) 500 MG tablet Take 1,000 mg by mouth in the morning and at bedtime. May also 2 tablets by mouth daily as needed for knee pain     albuterol (PROVENTIL) (2.5 MG/3ML) 0.083% nebulizer solution Take 3 mLs (2.5 mg total) by nebulization every 4 (four) hours as needed for wheezing or shortness of breath. 75 mL 2   albuterol (VENTOLIN HFA) 108 (90 Base) MCG/ACT inhaler Inhale 1 puff into the lungs every 8 (eight) hours as needed for wheezing or shortness of breath.     aspirin EC 81 MG tablet Take 1 tablet (81 mg total) by mouth daily. Reported on 08/18/2015 30 tablet 0    atorvastatin (LIPITOR) 40 MG tablet Take 1 tablet (40 mg total) by mouth daily. (Patient taking differently: Take 40 mg by mouth at bedtime.) 30 tablet 0   bisacodyl (DULCOLAX) 5 MG EC tablet Take 1 tablet (5 mg total) by mouth daily as needed for moderate constipation. 30 tablet 0   carvedilol (COREG CR) 10 MG 24 hr capsule Take 1 capsule (10 mg total) by mouth daily.     divalproex (DEPAKOTE) 500 MG DR tablet Take 2 tablets (1,000 mg total) by mouth at bedtime. 60 tablet 0   dorzolamide (TRUSOPT) 2 % ophthalmic solution Place 1 drop into the right eye 2 (two) times daily. 10 mL 0   Fluticasone-Umeclidin-Vilant (TRELEGY ELLIPTA) 100-62.5-25 MCG/ACT AEPB Inhale 1 Inhalation into the lungs daily. 28 each 1   furosemide (LASIX) 20 MG tablet Take 3 tablets (60 mg total) by mouth daily. 90 tablet 0   gabapentin (NEURONTIN) 300 MG capsule Take 1 capsule (300 mg total) by mouth 2 (two) times daily. 60 capsule 0   LACTOBACILLUS PROBIOTIC PO Take 1 capsule by mouth daily.     lactulose (CHRONULAC) 10 GM/15ML solution Take 30 mLs (20 g total) by mouth 2 (two) times daily. 236 mL 0   latanoprost (XALATAN) 0.005 % ophthalmic solution Place 1 drop into the right eye 2 (two) times daily. 2.5 mL 0   Melatonin 10 MG TABS Take 10 mg by mouth at bedtime.     Multiple Vitamins-Minerals (MULTIVITAL PO) Take 1 tablet by mouth daily.     nystatin powder Apply 1 Application topically 2 (two) times daily. Apply topically twice a day to groin region, bilateral buttocks, sacrum and inner upper thighs     pantoprazole (PROTONIX) 40 MG tablet Take 1 tablet (40 mg total) by mouth 2 (two) times daily. 30 tablet 0   potassium chloride (KLOR-CON) 10 MEQ tablet Take 1 tablet (10 mEq total) by mouth daily. 30 tablet 0   QUEtiapine (SEROQUEL) 200 MG tablet Take 1 tablet (200 mg total) by mouth at bedtime.     tamsulosin (FLOMAX) 0.4 MG CAPS capsule Take 0.4 mg by mouth at bedtime.     timolol (TIMOPTIC) 0.5 % ophthalmic solution  Place 1 drop into the right eye 2 (two) times daily. 10 mL 12   No facility-administered medications prior to visit.    Review of Systems  Constitutional:  Negative for chills, fever, malaise/fatigue and weight loss.  HENT:  Negative for congestion, sinus pain and sore throat.   Eyes: Negative.   Respiratory:  Positive for shortness of breath. Negative for cough, hemoptysis, sputum production and wheezing.   Cardiovascular:  Positive  for leg swelling. Negative for chest pain, palpitations, orthopnea and claudication.  Gastrointestinal:  Negative for abdominal pain, heartburn, nausea and vomiting.  Genitourinary: Negative.   Musculoskeletal:  Negative for joint pain and myalgias.  Skin:  Negative for rash.  Neurological:  Negative for weakness.  Endo/Heme/Allergies: Negative.   Psychiatric/Behavioral: Negative.     Objective:   Vitals:   06/12/22 1458  BP: 126/72  Pulse: 86  SpO2: 98%     Physical Exam Constitutional:      General: He is not in acute distress. HENT:     Head: Normocephalic and atraumatic.  Eyes:     Extraocular Movements: Extraocular movements intact.     Conjunctiva/sclera: Conjunctivae normal.     Pupils: Pupils are equal, round, and reactive to light.  Cardiovascular:     Rate and Rhythm: Normal rate and regular rhythm.     Pulses: Normal pulses.     Heart sounds: Normal heart sounds. No murmur heard. Pulmonary:     Breath sounds: Examination of the right-lower field reveals decreased breath sounds. Examination of the left-lower field reveals decreased breath sounds. Decreased breath sounds present. No wheezing or rhonchi.  Abdominal:     General: Bowel sounds are normal.     Palpations: Abdomen is soft.  Musculoskeletal:     Right lower leg: No edema.     Left lower leg: No edema.  Lymphadenopathy:     Cervical: No cervical adenopathy.  Skin:    General: Skin is warm and dry.  Neurological:     General: No focal deficit present.     Mental  Status: He is alert.  Psychiatric:        Mood and Affect: Mood normal.        Behavior: Behavior normal.        Thought Content: Thought content normal.        Judgment: Judgment normal.    CBC    Component Value Date/Time   WBC 9.1 05/27/2022 0650   RBC 4.02 (L) 05/27/2022 0650   HGB 12.2 (L) 05/27/2022 0650   HGB 14.1 08/17/2014 1444   HCT 35.7 (L) 05/27/2022 0650   HCT 39.8 (L) 08/17/2014 1444   PLT 481 (H) 05/27/2022 0650   PLT 246 08/17/2014 1444   MCV 88.8 05/27/2022 0650   MCV 93 08/17/2014 1444   MCH 30.3 05/27/2022 0650   MCHC 34.2 05/27/2022 0650   RDW 12.8 05/27/2022 0650   RDW 12.9 08/17/2014 1444   LYMPHSABS 1.7 05/27/2022 0650   LYMPHSABS 2.3 08/17/2014 1444   MONOABS 0.9 05/27/2022 0650   MONOABS 0.6 08/17/2014 1444   EOSABS 0.0 05/27/2022 0650   EOSABS 0.1 08/17/2014 1444   BASOSABS 0.0 05/27/2022 0650   BASOSABS 0.0 08/17/2014 1444      Latest Ref Rng & Units 05/27/2022    6:50 AM 05/26/2022    4:13 AM 05/25/2022    4:37 AM  BMP  Glucose 70 - 99 mg/dL 144  151  151   BUN 8 - 23 mg/dL 12  15  25   $ Creatinine 0.61 - 1.24 mg/dL 0.71  0.65  0.61   Sodium 135 - 145 mmol/L 138  134  132   Potassium 3.5 - 5.1 mmol/L 3.7  3.7  3.8   Chloride 98 - 111 mmol/L 91  88  88   CO2 22 - 32 mmol/L 36  38  37   Calcium 8.9 - 10.3 mg/dL 8.8  8.1  7.9  Chest imaging: CTA Chest 05/21/22 1. No CT evidence for acute pulmonary embolus. 2. Patchy bilateral airspace disease involving all lobes of both lungs. Imaging features compatible with multifocal pneumonia. 3. Small to moderate right and small left pleural effusions. 4.  Aortic Atherosclerosis  CT Chest 05/21/22 1. Interval increase in volume of bilateral pleural effusions. 2. Bilateral, upper and lower lobe areas of ground-glass and airspace consolidation are identified. Findings compatible with multifocal pneumonia and or ARDS. 3. Significant diminished AP diameter of the distal trachea and right and left  mainstem bronchus. 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 4. Coronary artery calcifications. 5.  Aortic Atherosclerosis  PFT:     No data to display          Labs:  Path:  Echo 04/14/22: LV EF 55-60%. RV systolic function and size are normal. Negative bubble study. No valvular vegetations.   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   His SpO2 on room air is 94%. He does not need supplemental oxygen while at rest. We will check ambulatory SpO2 at follow up visit. We will check overnight oximetry test on room air. He is to continue [place] device use.   He can continue trelegy ellipta 1 puff daily for now.   He is to continue aggressive PT to work on his conditioning.   Follow up in 1 month with PFTs.  Freda Jackson, MD [facility] Office: [phone]    Current Outpatient Medications:    acetaminophen (TYLENOL) 500 MG tablet, Take 1,000 mg by mouth in the morning and at bedtime. May also 2 tablets by mouth daily as needed for knee pain, Disp: , Rfl:    albuterol (PROVENTIL) (2.5 MG/3ML) 0.083% nebulizer solution, Take 3 mLs (2.5 mg total) by nebulization every 4 (four) hours as needed for wheezing or shortness of breath., Disp: 75 mL, Rfl: 2   albuterol (VENTOLIN HFA) 108 (90 Base) MCG/ACT inhaler, Inhale 1 puff into the lungs every 8 (eight) hours as needed for wheezing or shortness of breath., Disp:  , Rfl:    aspirin EC 81 MG tablet, Take 1 tablet (81 mg total) by mouth daily. Reported on 08/18/2015, Disp: 30 tablet, Rfl: 0   atorvastatin (LIPITOR) 40 MG tablet, Take 1 tablet (40 mg total) by mouth daily. (Patient taking differently: Take 40 mg by mouth at bedtime.), Disp: 30 tablet, Rfl: 0   bisacodyl (DULCOLAX) 5 MG EC tablet, Take 1 tablet (5 mg total) by mouth daily as needed for moderate constipation., Disp: 30 tablet, Rfl: 0   carvedilol (COREG CR) 10 MG 24 hr capsule, Take 1 capsule (10 mg total) by mouth daily., Disp: , Rfl:    divalproex (DEPAKOTE) 500 MG DR tablet, Take 2 tablets (1,000 mg total) by mouth at bedtime., Disp: 60 tablet, Rfl: 0   dorzolamide (TRUSOPT) 2 % ophthalmic solution, Place 1 drop into the right eye 2 (two) times daily., Disp: 10 mL, Rfl: 0   Fluticasone-Umeclidin-Vilant (TRELEGY ELLIPTA) 100-62.5-25 MCG/ACT AEPB,  Inhale 1 Inhalation into the lungs daily., Disp: 28 each, Rfl: 1   furosemide (LASIX) 20 MG tablet, Take 3 tablets (60 mg total) by mouth daily., Disp: 90 tablet, Rfl: 0   gabapentin (NEURONTIN) 300 MG capsule, Take 1 capsule (300 mg total) by mouth 2 (two) times daily., Disp: 60 capsule, Rfl: 0   LACTOBACILLUS PROBIOTIC PO, Take 1 capsule by mouth daily., Disp: , Rfl:    lactulose (CHRONULAC) 10 GM/15ML solution, Take 30 mLs (20 g total) by mouth 2 (two) times daily., Disp: 236 mL, Rfl: 0   latanoprost (XALATAN) 0.005 % ophthalmic solution, Place 1 drop into the right eye 2 (two) times daily., Disp: 2.5 mL, Rfl: 0   Melatonin 10 MG TABS, Take 10 mg by mouth at bedtime., Disp: , Rfl:    Multiple Vitamins-Minerals (MULTIVITAL PO), Take 1 tablet by mouth daily., Disp: , Rfl:    nystatin powder, Apply 1 Application topically 2 (two) times daily. Apply topically twice a day to groin region, bilateral buttocks, sacrum and inner upper thighs, Disp: , Rfl:    pantoprazole (PROTONIX) 40 MG tablet, Take 1 tablet (40 mg total) by mouth 2 (two) times daily., Disp:  30 tablet, Rfl: 0   potassium chloride (KLOR-CON) 10 MEQ tablet, Take 1 tablet (10 mEq total) by mouth daily., Disp: 30 tablet, Rfl: 0   QUEtiapine (SEROQUEL) 200 MG tablet, Take 1 tablet (200 mg total) by mouth at bedtime., Disp: , Rfl:    tamsulosin (FLOMAX) 0.4 MG CAPS capsule, Take 0.4 mg by mouth at bedtime., Disp: , Rfl:    timolol (TIMOPTIC) 0.5 % ophthalmic solution, Place 1 drop into the right eye 2 (two) times daily., Disp: 10 mL, Rfl: 12

## 2022-06-16 ENCOUNTER — Ambulatory Visit (INDEPENDENT_AMBULATORY_CARE_PROVIDER_SITE_OTHER): Payer: Medicare Other | Admitting: Internal Medicine

## 2022-06-16 ENCOUNTER — Other Ambulatory Visit: Payer: Self-pay

## 2022-06-16 VITALS — BP 127/80 | HR 89 | Temp 97.0°F

## 2022-06-16 DIAGNOSIS — T827XXD Infection and inflammatory reaction due to other cardiac and vascular devices, implants and grafts, subsequent encounter: Secondary | ICD-10-CM

## 2022-06-16 DIAGNOSIS — Z950 Presence of cardiac pacemaker: Secondary | ICD-10-CM

## 2022-06-16 NOTE — Progress Notes (Signed)
[facility] for Infectious Disease  CHIEF COMPLAINT:    Follow up for pacemaker infection  SUBJECTIVE:    Douglas Perkins. is a 77 y.o. male with PMHx as below who presents to the clinic for pacemaker infection.   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  He underwent extraction 12/18 with implant of a leadless PPM.  His blood cx cleared as of 04/10/22 and PICC line placed 12/20 for long term OPAT through 05/29/22.  Presents today for follow up and reports doing well.  No issues with PICC or IV antibiotics. Seen by RN on 12/27 for wound check where no redness, edema noted.  Wound was well healed.    06/16/22 id clinic f/u Patient came from snf Had finished iv abx pcng by 05/29/22 He has no complaint today Denies f/c/n/v/diarrhea/rash  He'll transfer back to ALF once done with rehab  He is here today with his wife. He still uses oxygen supplement 1.5 liters. He saw pulmonology Monday 2/12 -- cxr interstitial prominence, trace right effusion. He was briefly in hospital for dyspnea. It appears hypoxic resp failure is related to volume overload. Although he did get 2 days bsAbx in the beginning. He never smoked previously.  He'll see pulm again in 3-4 weeks  His breathing is stable/better  He is working well with physical therapy. Can walk at least 50 ft and overall improving    Patient's Medications  New Prescriptions   No medications on file  Previous Medications   ACETAMINOPHEN (TYLENOL) 500 MG TABLET    Take 1,000 mg by mouth in the morning and at bedtime. May also 2 tablets by mouth daily as needed for knee pain   ALBUTEROL (PROVENTIL) (2.5 MG/3ML) 0.083% NEBULIZER SOLUTION    Take 3 mLs (2.5 mg total) by nebulization every 4 (four) hours as needed for wheezing or shortness of breath.   ALBUTEROL (VENTOLIN HFA) 108 (90 BASE) MCG/ACT INHALER    Inhale 1 puff into the lungs every 8 (eight) hours as  needed for wheezing or shortness of breath.   ASPIRIN EC 81 MG TABLET    Take 1 tablet (81 mg total) by mouth daily. Reported on 08/18/2015   ATORVASTATIN (LIPITOR) 40 MG TABLET    Take 1 tablet (40 mg total) by mouth daily.   BISACODYL (DULCOLAX) 5 MG EC TABLET    Take 1 tablet (5 mg total) by mouth daily as needed for moderate constipation.   CARVEDILOL (COREG CR) 10 MG 24 HR CAPSULE    Take 1 capsule (10 mg total) by mouth daily.   DIVALPROEX (DEPAKOTE) 500 MG DR TABLET    Take 2 tablets (1,000 mg total) by mouth at bedtime.   DORZOLAMIDE (TRUSOPT) 2 % OPHTHALMIC SOLUTION    Place 1 drop into the right eye 2 (two) times daily.   FLUTICASONE-UMECLIDIN-VILANT (TRELEGY ELLIPTA) 100-62.5-25 MCG/ACT AEPB    Inhale 1 Inhalation into the lungs daily.   FUROSEMIDE (LASIX) 20 MG TABLET    Take 3 tablets (60 mg total) by mouth daily.   GABAPENTIN (NEURONTIN) 300 MG CAPSULE    Take 1 capsule (300 mg total) by mouth 2 (two) times daily.   LACTOBACILLUS PROBIOTIC PO    Take 1 capsule by mouth daily.   LACTULOSE (CHRONULAC) 10 GM/15ML SOLUTION    Take 30 mLs (20 g total) by mouth 2 (two) times daily.   LATANOPROST (XALATAN) 0.005 %  OPHTHALMIC SOLUTION    Place 1 drop into the right eye 2 (two) times daily.   MELATONIN 10 MG TABS    Take 10 mg by mouth at bedtime.   MULTIPLE VITAMINS-MINERALS (MULTIVITAL PO)    Take 1 tablet by mouth daily.   NYSTATIN POWDER    Apply 1 Application topically 2 (two) times daily. Apply topically twice a day to groin region, bilateral buttocks, sacrum and inner upper thighs   PANTOPRAZOLE (PROTONIX) 40 MG TABLET    Take 1 tablet (40 mg total) by mouth 2 (two) times daily.   POTASSIUM CHLORIDE (KLOR-CON) 10 MEQ TABLET    Take 1 tablet (10 mEq total) by mouth daily.   QUETIAPINE (SEROQUEL) 200 MG TABLET    Take 1 tablet (200 mg total) by mouth at bedtime.   TAMSULOSIN (FLOMAX) 0.4 MG CAPS CAPSULE    Take 0.4 mg by mouth at bedtime.   TIMOLOL (TIMOPTIC) 0.5 % OPHTHALMIC SOLUTION     Place 1 drop into the right eye 2 (two) times daily.  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[place])    Mural thrombus of cardiac apex    Presence of permanent cardiac pacemaker 2017   SA node dysfunction   Sleep apnea    does not use CPAP   TIA (transient ischemic attack)     Social History   Tobacco Use   Smoking status: Never   Smokeless tobacco: Never  Substance Use Topics   Alcohol use: No    Alcohol/week: 0.0 standard drinks of alcohol   Drug use: No    Family History  Problem Relation Age of Onset   Stroke Father    CAD Paternal Grandmother    CAD Paternal Grandfather     Allergies  Allergen Reactions   Sulfa Antibiotics Hives   Feldene [Piroxicam] Hives    Review of Systems  Constitutional:  Negative for fever.  Gastrointestinal: Negative.   All other ros negative   OBJECTIVE:    Vitals:   06/16/22 1111  BP: 127/80  Pulse: 89  Temp: (!) 97 F (36.1 C)  TempSrc: Temporal  SpO2: 99%   There is no height or weight on file to calculate BMI.  Physical Exam Constitutional:      General: He is not in acute distress. HENT:     Head: Normocephalic and atraumatic.  Eyes:     Extraocular Movements: Extraocular movements intact.     Conjunctiva/sclera: Conjunctivae normal.  Pulmonary:     Comments: Mildly increased WOB with nasal cannula. Pursed lips a bit but say it is better since last visit Abdominal:     General: There is no distension.     Palpations: Abdomen is soft.  Skin:    General: Skin is warm and dry.  Neurological:     General: No focal deficit present.     Mental Status: He is alert and oriented to person, place, and time.  Psychiatric:        Mood and Affect: Mood normal.        Behavior: Behavior normal.      Labs  and Microbiology:    Latest Ref Rng & Units 05/27/2022    6:50 AM  05/26/2022    4:13 AM 05/25/2022    4:37 AM  CBC  WBC 4.0 - 10.5 K/uL 9.1  7.7  6.8   Hemoglobin 13.0 - 17.0 g/dL 12.2  10.4  9.8   Hematocrit 39.0 - 52.0 % 35.7  31.7  29.8   Platelets 150 - 400 K/uL 481  402  353       Latest Ref Rng & Units 05/27/2022    6:50 AM 05/26/2022    4:13 AM 05/25/2022    4:37 AM  CMP  Glucose 70 - 99 mg/dL 144  151  151   BUN 8 - 23 mg/dL 12  15  25   $ Creatinine 0.61 - 1.24 mg/dL 0.71  0.65  0.61   Sodium 135 - 145 mmol/L 138  134  132   Potassium 3.5 - 5.1 mmol/L 3.7  3.7  3.8   Chloride 98 - 111 mmol/L 91  88  88   CO2 22 - 32 mmol/L 36  38  37   Calcium 8.9 - 10.3 mg/dL 8.8  8.1  7.9   Total Protein 6.5 - 8.1 g/dL 7.0  6.2  6.1   Total Bilirubin 0.3 - 1.2 mg/dL 0.5  0.3  0.3   Alkaline Phos 38 - 126 U/L 54  47  48   AST 15 - 41 U/L 35  41  40   ALT 0 - 44 U/L 47  49  50      No results found for this or any previous visit (from the past 240 hour(s)).  Imaging:    ASSESSMENT & PLAN:    No problem-specific Assessment & Plan notes found for this encounter.  #pacer infection #bacteremia S/p leadless pacer placement after extraction old pacer. Finished 6 weeks iv abx by 1/19 Doing well off abx. No sign of sepsis, strength improving, appetite is good No need for labs  Current dyspnea appears volume related. Pulm following. On 1.5 liters o2. Stable improving dyspnea.  I do not suspect any bacteria pna  No need to f/u ID clinic   T[facility]y [facility] for Infectious Disease [facility] 06/16/2022, 11:12 AM

## 2022-06-16 NOTE — Patient Instructions (Signed)
No need for follow up id clinic   Follow up pulmonology for your dyspnea and oxygen need

## 2022-07-03 ENCOUNTER — Inpatient Hospital Stay: Payer: TRICARE For Life (TFL) | Admitting: Pulmonary Disease

## 2022-07-18 NOTE — Progress Notes (Unsigned)
  Electrophysiology Office Follow up Visit Note:    Date:  07/19/2022   ID:  Douglas Buggy., DOB 10-04-1945, MRN [id number]  PCP:  Pcp, No  [facility] Cardiologist:  None  [facility] Electrophysiologist:  None    Interval History:    Douglas Deren. is a 77 y.o. male who presents for a follow up visit.   He had a lead extraction and micra PPM implant 03/2022 due to CIED associated infx (strep). He has done well after procedure. Today he tells me he is feeling much better.  He is increasing his walking and has joined a walking club at his assisted living in [place].    Past medical, surgical, social and family history were reviewed.  ROS:   Please see the history of present illness.    All other systems reviewed and are negative.  EKGs/Labs/Other Studies Reviewed:    The following studies were reviewed today:  07/19/2022 in clinic device interrogation personally reviewed Presenting rhythm a sensed, V sensed around 100 bpm Histograms look okay Battery longevity greater than 10 years V pacing 26.8% A sensed, V paced 23% 3.8% V paced only   Physical Exam:    VS:  BP (!) 146/82   Pulse (!) 102   Ht 6' (1.829 m)   Wt 247 lb 6.4 oz (112.2 kg)   SpO2 97%   BMI 33.55 kg/m     Wt Readings from Last 3 Encounters:  07/19/22 247 lb 6.4 oz (112.2 kg)  05/27/22 242 lb 15.2 oz (110.2 kg)  05/17/22 263 lb 3.7 oz (119.4 kg)     GEN:  Well nourished, well developed in no acute distress CARDIAC: RRR, no murmurs, rubs, gallops RESPIRATORY:  Clear to auscultation without rales, wheezing or rhonchi       ASSESSMENT:    1. Sick sinus syndrome (HCC)   2. Cardiac pacemaker in situ    PLAN:    In order of problems listed above:   #SSS #PPM in situ Device functioning appropriately, continue remote monitoring.       Signed, Lars Mage, MD, [facility], [facility] 07/19/2022 4:11 PM    Electrophysiology [facility]

## 2022-07-19 ENCOUNTER — Ambulatory Visit: Payer: Medicare Other | Attending: Cardiology | Admitting: Cardiology

## 2022-07-19 ENCOUNTER — Encounter: Payer: Self-pay | Admitting: Cardiology

## 2022-07-19 VITALS — BP 146/82 | HR 102 | Ht 72.0 in | Wt 247.4 lb

## 2022-07-19 DIAGNOSIS — Z950 Presence of cardiac pacemaker: Secondary | ICD-10-CM | POA: Diagnosis present

## 2022-07-19 DIAGNOSIS — I495 Sick sinus syndrome: Secondary | ICD-10-CM

## 2022-07-19 LAB — CUP PACEART INCLINIC DEVICE CHECK
Date Time Interrogation Session: 20240320162357
Implantable Pulse Generator Implant Date: 20231218

## 2022-07-19 NOTE — Patient Instructions (Addendum)
Medication Instructions:  Your physician recommends that you continue on your current medications as directed. Please refer to the Current Medication list given to you today. *If you need a refill on your cardiac medications before your next appointment, please call your pharmacy*    Follow-Up: Frankey Shown been referred to Dr. Audie Box for general cardiology  At [facility], you and your health needs are our priority.  As part of our continuing mission to provide you with exceptional heart care, we have created designated Provider Care Teams.  These Care Teams include your primary Cardiologist (physician) and Advanced Practice Providers (APPs -  Physician Assistants and Nurse Practitioners) who all work together to provide you with the care you need, when you need it.  We recommend signing up for the patient portal called "MyChart".  Sign up information is provided on this After Visit Summary.  MyChart is used to connect with patients for Virtual Visits (Telemedicine).  Patients are able to view lab/test results, encounter notes, upcoming appointments, etc.  Non-urgent messages can be sent to your provider as well.   To learn more about what you can do with MyChart, go to [email or web address].    Your next appointment:   1 year(s)  Provider:   Lars Mage, MD

## 2022-07-21 ENCOUNTER — Encounter (HOSPITAL_COMMUNITY): Payer: Self-pay

## 2022-07-21 ENCOUNTER — Other Ambulatory Visit: Payer: Self-pay

## 2022-07-21 ENCOUNTER — Emergency Department (HOSPITAL_COMMUNITY)
Admission: EM | Admit: 2022-07-21 | Discharge: 2022-07-21 | Disposition: A | Discharge status: Home / Self Care | Payer: Medicare Other | Attending: Emergency Medicine | Admitting: Emergency Medicine

## 2022-07-21 DIAGNOSIS — Z79899 Other long term (current) drug therapy: Secondary | ICD-10-CM | POA: Insufficient documentation

## 2022-07-21 DIAGNOSIS — R030 Elevated blood-pressure reading, without diagnosis of hypertension: Secondary | ICD-10-CM | POA: Diagnosis present

## 2022-07-21 DIAGNOSIS — I10 Essential (primary) hypertension: Secondary | ICD-10-CM | POA: Insufficient documentation

## 2022-07-21 DIAGNOSIS — I159 Secondary hypertension, unspecified: Secondary | ICD-10-CM

## 2022-07-21 MED ORDER — AMLODIPINE BESYLATE 5 MG PO TABS
5.0000 mg | ORAL_TABLET | Freq: Once | ORAL | Status: AC
  Administered 2022-07-21: 5 mg via ORAL
  Filled 2022-07-21: qty 1

## 2022-07-21 MED ORDER — AMLODIPINE BESYLATE 5 MG PO TABS: 5.0000 mg | ORAL_TABLET | Freq: Every day | ORAL | 0 refills | Status: AC

## 2022-07-21 MED ORDER — AMLODIPINE BESYLATE 5 MG PO TABS: 5.0000 mg | ORAL_TABLET | Freq: Every day | ORAL | 0 refills | Status: DC

## 2022-07-21 NOTE — Discharge Planning (Signed)
RNCM consulted regarding transportation to [facility] (ALF), [facility].  RNCM reached out to admissions, Lattie Haw and she will call back with details of who will be picking resident up. RNCM will keep team up-to-date as information becomes available.  Douglas Perkins J. Douglas Laming, RN, BSN, NCM  Transitions of Care  Nurse Case Manager  [facility]  Operative Services  [phone]

## 2022-07-21 NOTE — Discharge Instructions (Addendum)
Follow up with primary care doctor about blood pressure. I have started you on a new blood pressure medicine to take once a day.

## 2022-07-21 NOTE — ED Provider Notes (Signed)
[facility] Provider Note   CSN: [id number] Arrival date & time:        History  Chief Complaint  Patient presents with   Hypertension    PT ARRIVED VIA EMS FROM [facility] AFTER NURSE CALLED STATING HIS BP WAS HIGH AND HE NEEDED TO BE EVASLUATED. pT HAS HX OF HTN AND SHE GAVE HIM HIS MORNING BP MEDS ON THE WAY OUT THE DOOR WITH EMS    Douglas Perkins. is a 77 y.o. male.  Patient sent from his assisted living facility for high blood pressure.  Took his medication after nurse checked his blood pressure this morning and then sent him to the ED.  He has no symptoms.  No chest pain, no weakness, no stroke symptoms.  He has a history of high blood pressure has a pacemaker.  He denies any abdominal pain or shortness of breath.  No vision changes.  The history is provided by the patient.       Home Medications Prior to Admission medications   Medication Sig Start Date End Date Taking? Authorizing Provider  amLODipine (NORVASC) 5 MG tablet Take 1 tablet (5 mg total) by mouth daily. 07/21/22 08/20/22 Yes Akemi Overholser, DO  acetaminophen (TYLENOL) 500 MG tablet Take 1,000 mg by mouth in the morning and at bedtime. May also 2 tablets by mouth daily as needed for knee pain    [provider]  albuterol (PROVENTIL) (2.5 MG/3ML) 0.083% nebulizer solution Take 3 mLs (2.5 mg total) by nebulization every 4 (four) hours as needed for wheezing or shortness of breath. 05/27/22 05/27/23  Elodia Florence., MD  albuterol (VENTOLIN HFA) 108 (90 Base) MCG/ACT inhaler Inhale 1 puff into the lungs every 8 (eight) hours as needed for wheezing or shortness of breath. 08/26/21   [provider]  atorvastatin (LIPITOR) 40 MG tablet Take 1 tablet (40 mg total) by mouth daily. 03/10/21   Clapacs, Madie Reno, MD  bisacodyl (DULCOLAX) 5 MG EC tablet Take 1 tablet (5 mg total) by mouth daily as needed for moderate constipation. 03/09/21    Clapacs, Madie Reno, MD  carvedilol (COREG CR) 10 MG 24 hr capsule Take 1 capsule (10 mg total) by mouth daily. 05/18/22   Thurnell Lose, MD  divalproex (DEPAKOTE) 500 MG DR tablet Take 2 tablets (1,000 mg total) by mouth at bedtime. 03/09/21   Clapacs, Madie Reno, MD  dorzolamide (TRUSOPT) 2 % ophthalmic solution Place 1 drop into the right eye 2 (two) times daily. 03/09/21   Clapacs, Madie Reno, MD  Fluticasone-Umeclidin-Vilant (TRELEGY ELLIPTA) 100-62.5-25 MCG/ACT AEPB Inhale 1 Inhalation into the lungs daily. 05/27/22   Elodia Florence., MD  furosemide (LASIX) 20 MG tablet Take 3 tablets (60 mg total) by mouth daily. 05/28/22 06/27/22  Elodia Florence., MD  gabapentin (NEURONTIN) 300 MG capsule Take 1 capsule (300 mg total) by mouth 2 (two) times daily. 03/09/21   Clapacs, Madie Reno, MD  LACTOBACILLUS PROBIOTIC PO Take 1 capsule by mouth daily.    [provider]  lactulose (CHRONULAC) 10 GM/15ML solution Take 30 mLs (20 g total) by mouth 2 (two) times daily. 05/27/22   Elodia Florence., MD  latanoprost (XALATAN) 0.005 % ophthalmic solution Place 1 drop into the right eye 2 (two) times daily. 03/09/21   Clapacs, Madie Reno, MD  losartan (COZAAR) 25 MG tablet Take 25 mg by mouth daily.    [provider]  Melatonin 10 MG TABS Take 10 mg by mouth at bedtime.    [provider]  Multiple Vitamins-Minerals (MULTIVITAL PO) Take 1 tablet by mouth daily.    [provider]  nystatin powder Apply 1 Application topically 2 (two) times daily. Apply topically twice a day to groin region, bilateral buttocks, sacrum and inner upper thighs    [provider]  pantoprazole (PROTONIX) 40 MG tablet Take 1 tablet (40 mg total) by mouth 2 (two) times daily. 03/09/21   Clapacs, Madie Reno, MD  potassium chloride (KLOR-CON) 10 MEQ tablet Take 1 tablet (10 mEq total) by mouth daily. 05/18/22   Thurnell Lose, MD  QUEtiapine (SEROQUEL) 200 MG tablet Take 1 tablet (200 mg total) by  mouth at bedtime. 05/27/22   Elodia Florence., MD  tamsulosin (FLOMAX) 0.4 MG CAPS capsule Take 0.4 mg by mouth at bedtime.    [provider]  timolol (TIMOPTIC) 0.5 % ophthalmic solution Place 1 drop into the right eye 2 (two) times daily. 03/09/21   Clapacs, Madie Reno, MD      Allergies    Sulfa antibiotics and Feldene [piroxicam]    Review of Systems   Review of Systems  Physical Exam Updated Vital Signs BP (!) 172/87   Pulse 90   Temp 98.3 F (36.8 C) (Oral)   Resp 18   Ht 6' (1.829 m)   Wt 111.1 kg   SpO2 94%   BMI 33.23 kg/m  Physical Exam Vitals and nursing note reviewed.  Constitutional:      General: He is not in acute distress.    Appearance: He is well-developed. He is not ill-appearing.  HENT:     Head: Normocephalic and atraumatic.     Nose: Nose normal.  Eyes:     Extraocular Movements: Extraocular movements intact.     Conjunctiva/sclera: Conjunctivae normal.     Pupils: Pupils are equal, round, and reactive to light.  Cardiovascular:     Rate and Rhythm: Normal rate and regular rhythm.     Pulses: Normal pulses.     Heart sounds: Normal heart sounds. No murmur heard. Pulmonary:     Effort: Pulmonary effort is normal. No respiratory distress.     Breath sounds: Normal breath sounds.  Abdominal:     Palpations: Abdomen is soft.     Tenderness: There is no abdominal tenderness.  Musculoskeletal:        General: No swelling.     Cervical back: Normal range of motion and neck supple.  Skin:    General: Skin is warm and dry.     Capillary Refill: Capillary refill takes less than 2 seconds.  Neurological:     General: No focal deficit present.     Mental Status: He is alert and oriented to person, place, and time.     Cranial Nerves: No cranial nerve deficit.     Sensory: No sensory deficit.     Motor: No weakness.     Coordination: Coordination normal.  Psychiatric:        Mood and Affect: Mood normal.     ED Results / Procedures /  Treatments   Labs (all labs ordered are listed, but only abnormal results are displayed) Labs Reviewed - No data to display  EKG None  Radiology [facility] [place]  Result Date: 07/19/2022 Leadless check in clinic. Please see scanned/attached report.  Longevity 10 yrs  VS 70.9%, AM-VP 23%,  VP  3.8%.Elizabeth Watts BSN,RN,CCDS   Procedures Procedures    Medications Ordered in ED Medications  amLODipine (NORVASC) tablet 5 mg (has no administration in time range)    ED Course/ Medical Decision Making/ A&P                             Medical Decision Making Risk Prescription drug management.   [name]. is here for evaluation of high blood pressure.  Sent by his assisted living facility nurse because his blood pressure was elevated.  Checked his blood pressure this morning but had not taken his morning medications.  He is asymptomatic.  He has no stroke symptoms, no headache, no chest pain.  Blood pressure my evaluation is 166/87.  Normal vitals otherwise.  He is well-appearing.  Ultimately sounds like maybe he has had some elevated blood pressures here recently.  He saw Dr. Azucena Kuba days ago his blood pressure was 123456 systolic.  Ultimately I will put him on a low-dose of amlodipine and have him follow-up with his primary care doctor.  Discharged in good condition.  This chart was dictated using voice recognition software.  Despite best efforts to proofread,  errors can occur which can change the documentation meaning.         Final Clinical Impression(s) / ED Diagnoses Final diagnoses:  Secondary hypertension    Rx / DC Orders ED Discharge Orders          Ordered    amLODipine (NORVASC) 5 MG tablet  Daily        07/21/22 0710              Lennice Sites, DO 07/21/22 [id number]

## 2022-07-21 NOTE — ED Notes (Signed)
Report attempted to [facility].  No answer.

## 2022-07-21 NOTE — ED Notes (Signed)
pATIENT [facility] [place] BP WAS HIGH. pT HAS HX OF htn AND NURSE GAVE MORNING BP MEDS AS HE HEADED TO ER VIA EMS Pt has no complaints and states they should have given me my meds waited and re took my bp before sending me here.Pt a/o x 4 respirations even and non labored denies cp ha or any other associated bp symptoms

## 2022-07-28 ENCOUNTER — Ambulatory Visit: Payer: Medicare Other | Admitting: Pulmonary Disease

## 2022-07-28 DIAGNOSIS — J9621 Acute and chronic respiratory failure with hypoxia: Secondary | ICD-10-CM

## 2022-07-28 LAB — PULMONARY FUNCTION TEST
DL/VA % pred: 105 %
DL/VA: 4.09 ml/min/mmHg/L
DLCO cor % pred: 77 %
DLCO cor: 20.28 ml/min/mmHg
DLCO unc % pred: 77 %
DLCO unc: 20.28 ml/min/mmHg
FEF 25-75 Pre: 1.62 L/sec
FEF2575-%Pred-Pre: 71 %
FEV1-%Pred-Pre: 67 %
FEV1-Pre: 2.17 L
FEV1FVC-%Pred-Pre: 99 %
FEV6-%Pred-Pre: 71 %
FEV6-Pre: 3 L
FEV6FVC-%Pred-Pre: 105 %
FVC-%Pred-Pre: 68 %
FVC-Pre: 3.04 L
Pre FEV1/FVC ratio: 71 %
Pre FEV6/FVC Ratio: 99 %
RV % pred: 81 %
RV: 2.23 L
TLC % pred: 71 %
TLC: 5.33 L

## 2022-07-28 NOTE — Patient Instructions (Addendum)
Full PFT performed today except Post Spiro.

## 2022-07-28 NOTE — Progress Notes (Signed)
Full PFT performed today except Post Spiro.

## 2022-07-31 ENCOUNTER — Encounter: Payer: Self-pay | Admitting: Pulmonary Disease

## 2022-07-31 ENCOUNTER — Ambulatory Visit (INDEPENDENT_AMBULATORY_CARE_PROVIDER_SITE_OTHER): Payer: Medicare Other | Admitting: Pulmonary Disease

## 2022-07-31 VITALS — BP 128/72 | HR 97 | Ht 72.0 in | Wt 245.0 lb

## 2022-07-31 DIAGNOSIS — J189 Pneumonia, unspecified organism: Secondary | ICD-10-CM

## 2022-07-31 NOTE — Patient Instructions (Addendum)
Try stopping the trelegy ellipta and only use albuterol inhaler as needed.  If you feel you are needing albuterol multiple times per day, then you can resume the trelegy inhaler.   We will check a CT Chest scan to follow up your pneumonia.   Follow up in 3 months.

## 2022-07-31 NOTE — Progress Notes (Signed)
Synopsis: Referred in February 2024 for COPD   Subjective:   PATIENT ID: Douglas Perkins. GENDER: male DOB: April 14, 1946, MRN: [id number]  HPI  Chief Complaint  Patient presents with   Follow-up    F/U after PFT. States he has been doing well since last visit.    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   PFTs 07/28/22 show mild restriction.   He has been doing well since last visit and feels his breathing is back to baseline. He is not using supplemental O2 any more, he reports the facility provider discontinued his oxygen orders.   He continues to use trelegy ellipta 1 puff daily.   Initial OV 06/12/22 He was hospitalized from 12/10-22 for GBS bacteremia and possible PPM infection and another hospitalization from 1/11-1/18 for CHF exacerbation and multifocal pneumonia and again 1/21 to 1/27 for heart failure exacerbation and pneumonia.  He is currently using 2L of oxygen. He is receiving PT therapy at his rehab. He is feeling much better. He is accompanied by his wife.  No issues with his breathing prior to hospitalizations. He was living in independent living without issues.   He is a never smoker. No second hand smoke. 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 He did have agent orange exposure in [place].  Past Medical History:  Diagnosis Date   Bipolar 1 disorder    Cancer    prostate   Glaucoma    Hypertension    Merkel cell cancer    Mural thrombus of cardiac apex    Presence of permanent cardiac pacemaker 2017   SA node dysfunction   Sleep apnea    does not use CPAP   TIA (transient ischemic attack)      Family History  Problem Relation Age of Onset   Stroke Father    CAD Paternal  Grandmother    CAD Paternal Grandfather      Social History   Socioeconomic History   Marital status: Married    Spouse name: Not on file   Number of children: Not on file   Years of education: Not on file   Highest education level: Not on file  Occupational History   Occupation: disabled  Tobacco Use   Smoking status: Never   Smokeless tobacco: Never  Substance and Sexual Activity   Alcohol use: No    Alcohol/week: 0.0 standard drinks of alcohol   Drug use: No   Sexual activity: Not on file  Other Topics Concern   Not on file  Social History Narrative   Not on file   Social Determinants of Health   Financial Resource Strain: Low Risk  (05/23/2022)   Overall Financial Resource Strain (CARDIA)    Difficulty of Paying Living Expenses: Not very hard  Food Insecurity: No Food Insecurity (05/22/2022)   Hunger Vital Sign    Worried About Running Out of Food in the Last Year: Never true    Ran Out of Food in the Last Year: Never true  Transportation Needs: No Transportation Needs (05/22/2022)   PRAPARE - Hydrologist (Medical): No    Lack of Transportation (Non-Medical): No  Physical Activity: Not on file  Stress: Not on  file  Social Connections: Not on file  Intimate Partner Violence: Not At Risk (05/22/2022)   Humiliation, Afraid, Rape, and Kick questionnaire    Fear of Current or Ex-Partner: No    Emotionally Abused: No    Physically Abused: No    Sexually Abused: No     Allergies  Allergen Reactions   Sulfa Antibiotics Hives   Feldene [Piroxicam] Hives     Outpatient Medications Prior to Visit  Medication Sig Dispense Refill   acetaminophen (TYLENOL) 500 MG tablet Take 1,000 mg by mouth in the morning and at bedtime. May also 2 tablets by mouth daily as needed for knee pain     albuterol (PROVENTIL) (2.5 MG/3ML) 0.083% nebulizer solution Take 3 mLs (2.5 mg total) by nebulization every 4 (four) hours as needed for wheezing or shortness  of breath. 75 mL 2   albuterol (VENTOLIN HFA) 108 (90 Base) MCG/ACT inhaler Inhale 1 puff into the lungs every 8 (eight) hours as needed for wheezing or shortness of breath.     amLODipine (NORVASC) 5 MG tablet Take 1 tablet (5 mg total) by mouth daily. 30 tablet 0   atorvastatin (LIPITOR) 40 MG tablet Take 1 tablet (40 mg total) by mouth daily. 30 tablet 0   bisacodyl (DULCOLAX) 5 MG EC tablet Take 1 tablet (5 mg total) by mouth daily as needed for moderate constipation. 30 tablet 0   carvedilol (COREG CR) 10 MG 24 hr capsule Take 1 capsule (10 mg total) by mouth daily.     divalproex (DEPAKOTE) 500 MG DR tablet Take 2 tablets (1,000 mg total) by mouth at bedtime. 60 tablet 0   dorzolamide (TRUSOPT) 2 % ophthalmic solution Place 1 drop into the right eye 2 (two) times daily. 10 mL 0   Fluticasone-Umeclidin-Vilant (TRELEGY ELLIPTA) 100-62.5-25 MCG/ACT AEPB Inhale 1 Inhalation into the lungs daily. 28 each 1   gabapentin (NEURONTIN) 300 MG capsule Take 1 capsule (300 mg total) by mouth 2 (two) times daily. 60 capsule 0   LACTOBACILLUS PROBIOTIC PO Take 1 capsule by mouth daily.     lactulose (CHRONULAC) 10 GM/15ML solution Take 30 mLs (20 g total) by mouth 2 (two) times daily. 236 mL 0   latanoprost (XALATAN) 0.005 % ophthalmic solution Place 1 drop into the right eye 2 (two) times daily. 2.5 mL 0   losartan (COZAAR) 25 MG tablet Take 25 mg by mouth daily.     Melatonin 10 MG TABS Take 10 mg by mouth at bedtime.     Multiple Vitamins-Minerals (MULTIVITAL PO) Take 1 tablet by mouth daily.     nystatin powder Apply 1 Application topically 2 (two) times daily. Apply topically twice a day to groin region, bilateral buttocks, sacrum and inner upper thighs     pantoprazole (PROTONIX) 40 MG tablet Take 1 tablet (40 mg total) by mouth 2 (two) times daily. 30 tablet 0   potassium chloride (KLOR-CON) 10 MEQ tablet Take 1 tablet (10 mEq total) by mouth daily. 30 tablet 0   QUEtiapine (SEROQUEL) 200 MG tablet  Take 1 tablet (200 mg total) by mouth at bedtime.     tamsulosin (FLOMAX) 0.4 MG CAPS capsule Take 0.4 mg by mouth at bedtime.     timolol (TIMOPTIC) 0.5 % ophthalmic solution Place 1 drop into the right eye 2 (two) times daily. 10 mL 12   furosemide (LASIX) 20 MG tablet Take 3 tablets (60 mg total) by mouth daily. 90 tablet 0   No facility-administered medications prior  to visit.    Review of Systems  Constitutional:  Negative for chills, fever, malaise/fatigue and weight loss.  HENT:  Negative for congestion, sinus pain and sore throat.   Eyes: Negative.   Respiratory:  Positive for shortness of breath. Negative for cough, hemoptysis, sputum production and wheezing.   Cardiovascular:  Negative for chest pain, palpitations, orthopnea, claudication and leg swelling.  Gastrointestinal:  Negative for abdominal pain, heartburn, nausea and vomiting.  Genitourinary: Negative.   Musculoskeletal:  Negative for joint pain and myalgias.  Skin:  Negative for rash.  Neurological:  Negative for weakness.  Endo/Heme/Allergies: Negative.   Psychiatric/Behavioral: Negative.     Objective:   Vitals:   07/31/22 0828  BP: 128/72  Pulse: 97  SpO2: 99%  Weight: 245 lb (111.1 kg)  Height: 6' (1.829 m)   Physical Exam Constitutional:      General: He is not in acute distress. HENT:     Head: Normocephalic and atraumatic.  Eyes:     Conjunctiva/sclera: Conjunctivae normal.  Cardiovascular:     Rate and Rhythm: Normal rate and regular rhythm.     Pulses: Normal pulses.     Heart sounds: Normal heart sounds. No murmur heard. Pulmonary:     Breath sounds: No wheezing or rhonchi.  Musculoskeletal:     Right lower leg: No edema.     Left lower leg: No edema.  Skin:    General: Skin is warm and dry.  Neurological:     General: No focal deficit present.     Mental Status: He is alert.    CBC    Component Value Date/Time   WBC 9.1 05/27/2022 0650   RBC 4.02 (L) 05/27/2022 0650   HGB 12.2  (L) 05/27/2022 0650   HGB 14.1 08/17/2014 1444   HCT 35.7 (L) 05/27/2022 0650   HCT 39.8 (L) 08/17/2014 1444   PLT 481 (H) 05/27/2022 0650   PLT 246 08/17/2014 1444   MCV 88.8 05/27/2022 0650   MCV 93 08/17/2014 1444   MCH 30.3 05/27/2022 0650   MCHC 34.2 05/27/2022 0650   RDW 12.8 05/27/2022 0650   RDW 12.9 08/17/2014 1444   LYMPHSABS 1.7 05/27/2022 0650   LYMPHSABS 2.3 08/17/2014 1444   MONOABS 0.9 05/27/2022 0650   MONOABS 0.6 08/17/2014 1444   EOSABS 0.0 05/27/2022 0650   EOSABS 0.1 08/17/2014 1444   BASOSABS 0.0 05/27/2022 0650   BASOSABS 0.0 08/17/2014 1444      Latest Ref Rng & Units 05/27/2022    6:50 AM 05/26/2022    4:13 AM 05/25/2022    4:37 AM  BMP  Glucose 70 - 99 mg/dL 144  151  151   BUN 8 - 23 mg/dL 12  15  25    Creatinine 0.61 - 1.24 mg/dL 0.71  0.65  0.61   Sodium 135 - 145 mmol/L 138  134  132   Potassium 3.5 - 5.1 mmol/L 3.7  3.7  3.8   Chloride 98 - 111 mmol/L 91  88  88   CO2 22 - 32 mmol/L 36  38  37   Calcium 8.9 - 10.3 mg/dL 8.8  8.1  7.9    Chest imaging: CTA Chest 05/21/22 1. No CT evidence for acute pulmonary embolus. 2. Patchy bilateral airspace disease involving all lobes of both lungs. Imaging features compatible with multifocal pneumonia. 3. Small to moderate right and small left pleural effusions. 4.  Aortic Atherosclerosis  CT Chest 05/21/22 1. Interval increase in volume of  bilateral pleural effusions. 2. Bilateral, upper and lower lobe areas of ground-glass and airspace consolidation are identified. Findings compatible with multifocal pneumonia and or ARDS. 3. Significant diminished AP diameter of the distal trachea and right and left mainstem bronchus. 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 4. Coronary artery calcifications. 5.  Aortic Atherosclerosis  PFT:    Latest Ref Rng & Units 07/28/2022   10:58 AM  PFT Results  FVC-Pre L 3.04  P  FVC-Predicted Pre % 68  P   Pre FEV1/FVC % % 71  P  FEV1-Pre L 2.17  P  FEV1-Predicted Pre % 67  P  DLCO uncorrected ml/min/mmHg 20.28  P  DLCO UNC% % 77  P  DLCO corrected ml/min/mmHg 20.28  P  DLCO COR %Predicted % 77  P  DLVA Predicted % 105  P  TLC L 5.33  P  TLC % Predicted % 71  P  RV % Predicted % 81  P    P Preliminary result    Labs:  Path:  Echo 04/14/22: LV EF 55-60%. RV systolic function and size are normal. Negative bubble study. No valvular vegetations.   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   He was been taken off supplemental O2 by his nursing home provider.   I recommend he stop trelegy ellipta and monitor his breathing symptoms. He can use albuterol as needed. If he is needing albuterol on a frequent basis then he can resume trelegy ellipta.   We will check a CT Chest scan to monitor for resolution of the pneumonia.  Follow up in 3 months.   Freda Jackson, MD [facility] Office: [phone]    Current Outpatient Medications:    acetaminophen (TYLENOL) 500 MG tablet, Take 1,000 mg by mouth in the morning and at bedtime. May also 2 tablets by mouth daily as needed for knee pain, Disp: , Rfl:    albuterol (PROVENTIL) (2.5 MG/3ML) 0.083% nebulizer solution, Take 3 mLs (2.5 mg total) by nebulization every 4 (four) hours as needed for wheezing or shortness of breath., Disp: 75 mL, Rfl: 2   albuterol (VENTOLIN HFA) 108 (90 Base) MCG/ACT inhaler, Inhale 1 puff into the lungs every 8 (eight) hours as needed for wheezing or shortness of breath., Disp: , Rfl:    amLODipine (NORVASC) 5 MG tablet, Take 1 tablet (5 mg  total) by mouth daily., Disp: 30 tablet, Rfl: 0   atorvastatin (LIPITOR) 40 MG tablet, Take 1 tablet (40 mg total) by mouth daily., Disp: 30 tablet, Rfl: 0   bisacodyl (DULCOLAX) 5 MG EC tablet, Take 1 tablet (5 mg total) by mouth daily as needed for moderate constipation., Disp: 30 tablet, Rfl: 0   carvedilol (COREG CR) 10 MG 24 hr capsule, Take 1 capsule (10 mg total) by mouth daily., Disp: , Rfl:    divalproex (DEPAKOTE) 500 MG DR tablet, Take 2 tablets (1,000 mg total) by mouth at bedtime., Disp: 60 tablet, Rfl: 0   dorzolamide (TRUSOPT) 2 % ophthalmic solution, Place 1 drop into the right eye 2 (two) times daily., Disp: 10 mL, Rfl: 0   Fluticasone-Umeclidin-Vilant (TRELEGY  ELLIPTA) 100-62.5-25 MCG/ACT AEPB, Inhale 1 Inhalation into the lungs daily., Disp: 28 each, Rfl: 1   gabapentin (NEURONTIN) 300 MG capsule, Take 1 capsule (300 mg total) by mouth 2 (two) times daily., Disp: 60 capsule, Rfl: 0   LACTOBACILLUS PROBIOTIC PO, Take 1 capsule by mouth daily., Disp: , Rfl:    lactulose (CHRONULAC) 10 GM/15ML solution, Take 30 mLs (20 g total) by mouth 2 (two) times daily., Disp: 236 mL, Rfl: 0   latanoprost (XALATAN) 0.005 % ophthalmic solution, Place 1 drop into the right eye 2 (two) times daily., Disp: 2.5 mL, Rfl: 0   losartan (COZAAR) 25 MG tablet, Take 25 mg by mouth daily., Disp: , Rfl:    Melatonin 10 MG TABS, Take 10 mg by mouth at bedtime., Disp: , Rfl:    Multiple Vitamins-Minerals (MULTIVITAL PO), Take 1 tablet by mouth daily., Disp: , Rfl:    nystatin powder, Apply 1 Application topically 2 (two) times daily. Apply topically twice a day to groin region, bilateral buttocks, sacrum and inner upper thighs, Disp: , Rfl:    pantoprazole (PROTONIX) 40 MG tablet, Take 1 tablet (40 mg total) by mouth 2 (two) times daily., Disp: 30 tablet, Rfl: 0   potassium chloride (KLOR-CON) 10 MEQ tablet, Take 1 tablet (10 mEq total) by mouth daily., Disp: 30 tablet, Rfl: 0   QUEtiapine (SEROQUEL) 200 MG  tablet, Take 1 tablet (200 mg total) by mouth at bedtime., Disp: , Rfl:    tamsulosin (FLOMAX) 0.4 MG CAPS capsule, Take 0.4 mg by mouth at bedtime., Disp: , Rfl:    timolol (TIMOPTIC) 0.5 % ophthalmic solution, Place 1 drop into the right eye 2 (two) times daily., Disp: 10 mL, Rfl: 12   furosemide (LASIX) 20 MG tablet, Take 3 tablets (60 mg total) by mouth daily., Disp: 90 tablet, Rfl: 0

## 2022-08-16 ENCOUNTER — Encounter: Payer: Self-pay | Admitting: Cardiovascular Disease

## 2022-08-16 ENCOUNTER — Ambulatory Visit: Payer: Medicare Other | Attending: Cardiovascular Disease | Admitting: Cardiovascular Disease

## 2022-08-16 ENCOUNTER — Telehealth: Payer: Self-pay | Admitting: Cardiovascular Disease

## 2022-08-16 VITALS — BP 124/56 | HR 108 | Ht 72.0 in | Wt 249.0 lb

## 2022-08-16 DIAGNOSIS — E782 Mixed hyperlipidemia: Secondary | ICD-10-CM | POA: Diagnosis present

## 2022-08-16 DIAGNOSIS — I495 Sick sinus syndrome: Secondary | ICD-10-CM | POA: Diagnosis present

## 2022-08-16 DIAGNOSIS — R000 Tachycardia, unspecified: Secondary | ICD-10-CM

## 2022-08-16 DIAGNOSIS — I4510 Unspecified right bundle-branch block: Secondary | ICD-10-CM

## 2022-08-16 DIAGNOSIS — I5033 Acute on chronic diastolic (congestive) heart failure: Secondary | ICD-10-CM | POA: Diagnosis present

## 2022-08-16 DIAGNOSIS — I440 Atrioventricular block, first degree: Secondary | ICD-10-CM | POA: Diagnosis present

## 2022-08-16 DIAGNOSIS — Z950 Presence of cardiac pacemaker: Secondary | ICD-10-CM | POA: Diagnosis present

## 2022-08-16 DIAGNOSIS — G4733 Obstructive sleep apnea (adult) (pediatric): Secondary | ICD-10-CM

## 2022-08-16 MED ORDER — CARVEDILOL 6.25 MG PO TABS: 6.2500 mg | ORAL_TABLET | Freq: Two times a day (BID) | ORAL | 3 refills | Status: DC

## 2022-08-16 NOTE — Telephone Encounter (Signed)
RN called and spoke to [employer] - verbal order given per Dr Tresa Endo   Stop Coreg CR Start Carvedilol 6.25 mg twice a day  Vital signs( heart rate and blood pressures)  twice  a day and record     Per Ian Malkin RN , May 1 , 2024 will be the end of the 2 weeks - the [facility] will send the  twice a day vital sign reading for  Dr Tresa Endo to review and comment if further order are needed.  Ian Malkin ,RN verbalized understanding.

## 2022-08-16 NOTE — Telephone Encounter (Signed)
  Pt c/o medication issue:  1. Name of Medication: carvedilol (COREG) 6.25 MG tablet   2. How are you currently taking this medication (dosage and times per day)?   3. Are you having a reaction (difficulty breathing--STAT)? no  4. What is your medication issue? Frederico Hamman, RN from [employer] called about the way the order was written for patient's carvedilol.  He said the way the order was written the nursing home can't do.  He suggest for it to be: start carvedilol 6.25mg  BID call MD in two weeks with vital signs for last 2 weeks.  He would like to get a verbal for this order.

## 2022-08-16 NOTE — Telephone Encounter (Signed)
RN  spoke to Dr Tresa Endo.   Okay to give a verbal order to change to take Carvedilol 6.25 mg twice a day . Take vital signs blood pressure and heart rate twice daily  for 2 weeks . Send  the reading to  office for further medication titration ( either by  fax  [phone] or phone [phone])

## 2022-08-16 NOTE — Patient Instructions (Addendum)
Medication Instructions:  STOP Carvedilol CR 10 mg START Carvedilol 6.25 mg twice daily- monitor HR for 2 weeks- if above 100, increase to 12.5 mg twice daily.  *If you need a refill on your cardiac medications before your next appointment, please call your pharmacy*   Lab Work: CMET, CBC, TSH, LIPID (come back fasting, or have it drawn at your facility- and send [place] the results)   If you have labs (blood work) drawn today and your tests are completely normal, you will receive your results only by: MyChart Message (if you have MyChart) OR A paper copy in the mail If you have any lab test that is abnormal or we need to change your treatment, we will call you to review the results.   Testing/Procedures:  Echocardiogram - Your physician has requested that you have an echocardiogram. Echocardiography is a painless test that uses sound waves to create images of your heart. It provides your doctor with information about the size and shape of your heart and how well your heart's chambers and valves are working. This procedure takes approximately one hour. There are no restrictions for this procedure.     Follow-Up: At [facility], you and your health needs are our priority.  As part of our continuing mission to provide you with exceptional heart care, we have created designated Provider Care Teams.  These Care Teams include your primary Cardiologist (physician) and Advanced Practice Providers (APPs -  Physician Assistants and Nurse Practitioners) who all work together to provide you with the care you need, when you need it.  We recommend signing up for the patient portal called "MyChart".  Sign up information is provided on this After Visit Summary.  MyChart is used to connect with patients for Virtual Visits (Telemedicine).  Patients are able to view lab/test results, encounter notes, upcoming appointments, etc.  Non-urgent messages can be sent to your provider as well.   To learn more  about what you can do with MyChart, go to [email or web address].    Your next appointment:   6 week(s)  Provider:   Marjie Skiff, PA-C, Juanda Crumble, PA-C, Azalee Course, PA-C, Bernadene Person, NP, or Carlos Levering, NP

## 2022-08-16 NOTE — Progress Notes (Signed)
Cardiology Office Note    Date:  08/23/2022   ID:  Douglas Perkins., DOB 1946/03/13, MRN [id number]  PCP:  Oneita Hurt, No  Cardiologist:  Nicki Guadalajara, MD   Chief Complaint  Patient presents with   New Patient (Initial Visit)        History of Present Illness:  Douglas Perkins. is a 77 y.o. male who is followed by Dr. Melody Comas for pulmonology, and Dr. Lalla Brothers for EP.  He is a retired Management consultant for 20 years and had attended [employer].  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  He has a history of obstructive sleep apnea.  He is status post pacemaker insertion at [facility] approximately 6 years ago.  He required lead extraction and a new Medtronic Micra AV2 PPM implant in December 2023 by Dr. Steffanie Dunn resulting from strep bacteremia on his prior device.  He tells me he is followed at the [place].  He had insertion of an inspire device approximately 1 to 2 years ago.  He is being followed by Dr. Jerre Simon at [facility].  He believes he is sleeping well.  He has a history of hypertension.  He was recently evaluated in [facility] for blood pressure elevation.  He was on amlodipine 5 mg, long-acting carvedilol CR 10 mg daily, furosemide 60 mg and losartan 25 mg daily.  He is on Flomax.  He takes pantoprazole twice a day for GERD.  He is on atorvastatin 40 mg for hyperlipidemia.  He is now living in assisted living at [facility] for medication management.  He is now referred for establishment of cardiology care.  Past Medical History:  Diagnosis Date   Bipolar 1 disorder    Cancer    prostate   Glaucoma    Hypertension    Merkel cell cancer    Mural thrombus of cardiac apex    Presence of permanent cardiac pacemaker 2017   SA  node dysfunction   Sleep apnea    does not use CPAP   TIA (transient ischemic attack)     Past Surgical History:  Procedure Laterality Date   BUBBLE STUDY  04/14/2022   Procedure: BUBBLE STUDY;  Surgeon: Lewayne Bunting, MD;  Location: [facility] ENDOSCOPY;  Service: Cardiovascular;;   CHOLECYSTECTOMY     COLONOSCOPY WITH PROPOFOL N/A 01/31/2017   Procedure: COLONOSCOPY WITH PROPOFOL;  Surgeon: Scot Jun, MD;  Location: [facility] ENDOSCOPY;  Service: Endoscopy;  Laterality: N/A;   DRUG INDUCED ENDOSCOPY N/A 05/12/2020   Procedure: DRUG INDUCED ENDOSCOPY;  Surgeon: Christia Reading, MD;  Location: [facility];  Service: ENT;  Laterality: N/A;   IMPLANTATION OF HYPOGLOSSAL NERVE STIMULATOR N/A 07/21/2020   Procedure: IMPLANTATION OF HYPOGLOSSAL NERVE STIMULATOR;  Surgeon: Christia Reading, MD;  Location: [facility];  Service: ENT;  Laterality: N/A;   LEAD EXTRACTION N/A 04/17/2022   Procedure: LEAD EXTRACTION;  Surgeon: Lanier Prude, MD;  Location: [facility] INVASIVE CV LAB;  Service: Cardiovascular;  Laterality: N/A;   LEG SURGERY Left    distal   Pace maker placement     PROSTATE SURGERY     PROSTATECTOMY     SKIN CANCER EXCISION  [id number]   Merkle Cell Carcinoma  TEE WITHOUT CARDIOVERSION N/A 04/14/2022   Procedure: TRANSESOPHAGEAL ECHOCARDIOGRAM (TEE);  Surgeon: Lewayne Bunting, MD;  Location: [facility] ENDOSCOPY;  Service: Cardiovascular;  Laterality: N/A;    Current Medications: Outpatient Medications Prior to Visit  Medication Sig Dispense Refill   acetaminophen (TYLENOL) 500 MG tablet Take 1,000 mg by mouth in the morning and at bedtime. May also 2 tablets by mouth daily as needed for knee pain     albuterol (PROVENTIL) (2.5 MG/3ML) 0.083% nebulizer solution Take 3 mLs (2.5 mg total) by nebulization every 4 (four) hours as needed for wheezing or shortness of breath. 75 mL 2   albuterol (VENTOLIN HFA) 108 (90 Base) MCG/ACT inhaler Inhale 1 puff into the lungs  every 8 (eight) hours as needed for wheezing or shortness of breath.     amLODipine (NORVASC) 5 MG tablet Take 1 tablet (5 mg total) by mouth daily. 30 tablet 0   atorvastatin (LIPITOR) 40 MG tablet Take 1 tablet (40 mg total) by mouth daily. 30 tablet 0   bisacodyl (DULCOLAX) 5 MG EC tablet Take 1 tablet (5 mg total) by mouth daily as needed for moderate constipation. 30 tablet 0   divalproex (DEPAKOTE) 500 MG DR tablet Take 2 tablets (1,000 mg total) by mouth at bedtime. 60 tablet 0   dorzolamide (TRUSOPT) 2 % ophthalmic solution Place 1 drop into the right eye 2 (two) times daily. 10 mL 0   Fluticasone-Umeclidin-Vilant (TRELEGY ELLIPTA) 100-62.5-25 MCG/ACT AEPB Inhale 1 Inhalation into the lungs daily. 28 each 1   gabapentin (NEURONTIN) 300 MG capsule Take 1 capsule (300 mg total) by mouth 2 (two) times daily. 60 capsule 0   LACTOBACILLUS PROBIOTIC PO Take 1 capsule by mouth daily.     lactulose (CHRONULAC) 10 GM/15ML solution Take 30 mLs (20 g total) by mouth 2 (two) times daily. 236 mL 0   latanoprost (XALATAN) 0.005 % ophthalmic solution Place 1 drop into the right eye 2 (two) times daily. 2.5 mL 0   losartan (COZAAR) 25 MG tablet Take 25 mg by mouth daily.     Melatonin 10 MG TABS Take 10 mg by mouth at bedtime.     Multiple Vitamins-Minerals (MULTIVITAL PO) Take 1 tablet by mouth daily.     nystatin powder Apply 1 Application topically 2 (two) times daily. Apply topically twice a day to groin region, bilateral buttocks, sacrum and inner upper thighs     pantoprazole (PROTONIX) 40 MG tablet Take 1 tablet (40 mg total) by mouth 2 (two) times daily. 30 tablet 0   potassium chloride (KLOR-CON) 10 MEQ tablet Take 1 tablet (10 mEq total) by mouth daily. 30 tablet 0   QUEtiapine (SEROQUEL) 200 MG tablet Take 1 tablet (200 mg total) by mouth at bedtime.     tamsulosin (FLOMAX) 0.4 MG CAPS capsule Take 0.4 mg by mouth at bedtime.     timolol (TIMOPTIC) 0.5 % ophthalmic solution Place 1 drop into  the right eye 2 (two) times daily. 10 mL 12   carvedilol (COREG CR) 10 MG 24 hr capsule Take 1 capsule (10 mg total) by mouth daily.     furosemide (LASIX) 20 MG tablet Take 3 tablets (60 mg total) by mouth daily. 90 tablet 0   No facility-administered medications prior to visit.     Allergies:   Sulfa antibiotics and Feldene [piroxicam]   Social History   Socioeconomic History   Marital status: Married    Spouse name: Not on file   Number of children:  Not on file   Years of education: Not on file   Highest education level: Not on file  Occupational History   Occupation: disabled  Tobacco Use   Smoking status: Never   Smokeless tobacco: Never  Substance and Sexual Activity   Alcohol use: No    Alcohol/week: 0.0 standard drinks of alcohol   Drug use: No   Sexual activity: Not on file  Other Topics Concern   Not on file  Social History Narrative   Not on file   Social Determinants of Health   Financial Resource Strain: Low Risk  (05/23/2022)   Overall Financial Resource Strain (CARDIA)    Difficulty of Paying Living Expenses: Not very hard  Food Insecurity: No Food Insecurity (05/22/2022)   Hunger Vital Sign    Worried About Running Out of Food in the Last Year: Never true    Ran Out of Food in the Last Year: Never true  Transportation Needs: No Transportation Needs (05/22/2022)   PRAPARE - Administrator, Civil Service (Medical): No    Lack of Transportation (Non-Medical): No  Physical Activity: Not on file  Stress: Not on file  Social Connections: Not on file    Socially, he was born in [place].  He has been living in [place] since 1993 and is now residing in assisted living at [facility].  He is married for 56 years.  His wife is not at [facility].  He attended [employer].  He is a retired Management consultant.  He had several deployments to [place].  He is on [place] combat disability.  Family History:  The patient's family history includes CAD  in his paternal grandfather and paternal grandmother; Heart disease in his father and mother; Stroke in his father.  Both parents died in their 103s.  His mother had a stroke in father a heart attack.  He has a brother age 71 in the 70s at age 5.  ROS General: Negative; No fevers, chills, or night sweats;  HEENT: Negative; No changes in vision or hearing, sinus congestion, difficulty swallowing Pulmonary: Recent strep bacteremia Cardiovascular: Permanent pacemaker, recent explantation of prior device with insertion of new Medtronic Micra AV 2: [id number] GI: Negative; No nausea, vomiting, diarrhea, or abdominal pain GU: Negative; No dysuria, hematuria, or difficulty voiding Musculoskeletal: Negative; no myalgias, joint pain, or weakness Hematologic/Oncology: Right leg Merkel cell carcinoma treated with heavy radiation resulting in muscle atrophy Endocrine: Negative; no heat/cold intolerance; no diabetes Neuro: Negative; no changes in balance, headaches Skin: Negative; No rashes or skin lesions Psychiatric: Negative; No behavioral problems, depression Sleep: Positive for OSA, status post inspire device, [facility]  Other comprehensive 14 point system review is negative.   PHYSICAL EXAM:   VS:  BP (!) 124/56 (BP Location: Right Arm, Patient Position: Sitting, Cuff Size: Large)   Pulse (!) 108   Ht 6' (1.829 m)   Wt 249 lb (112.9 kg)   BMI 33.77 kg/m     Repeat blood pressure by me 136/80  Wt Readings from Last 3 Encounters:  08/16/22 249 lb (112.9 kg)  07/31/22 245 lb (111.1 kg)  07/21/22 245 lb (111.1 kg)    General: Alert, oriented, no distress.  Skin: normal turgor, no rashes, warm and dry HEENT: Normocephalic, atraumatic. Pupils equal round and reactive to light; sclera anicteric; extraocular muscles intact;  Nose without nasal septal hypertrophy Mouth/Parynx benign; Mallinpatti scale 3/4 Neck: No JVD, no carotid  bruits; normal carotid  upstroke Lungs: clear to ausculatation and percussion; no wheezing or rales Chest wall: Inspire device Heart: PMI not displaced, RRR, s1 s2 normal, 1/6 systolic murmur, no diastolic murmur, soft gallops, no rub, thrills, or heaves Abdomen: soft, nontender; no hepatosplenomehaly, BS+; abdominal aorta nontender and not dilated by palpation. Back: no CVA tenderness Pulses 2+ Musculoskeletal: full range of motion, normal strength, no joint deformities Extremities: Atrophied right lower leg secondary to prior radiation treatment for Merkel cell carcinoma no clubbing cyanosis or edema, Homan's sign negative  Neurologic: grossly nonfocal; Cranial nerves grossly wnl Psychologic: Normal mood and affect   Studies/Labs Reviewed:   August 16, 2022 ECG (independently read by me): Sinus tachycardia at 108, RBBB, 1st degree AV block, PR 224 msec, LVH  Recent Labs:    Latest Ref Rng & Units 05/27/2022    6:50 AM 05/26/2022    4:13 AM 05/25/2022    4:37 AM  BMP  Glucose 70 - 99 mg/dL 161  096  045   BUN 8 - 23 mg/dL Creatinine 0.61 - 1.24 mg/dL 4.09  8.11  9.14   Sodium 135 - 145 mmol/L 138  134  132   Potassium 3.5 - 5.1 mmol/L 3.7  3.7  3.8   Chloride 98 - 111 mmol/L 91  88  88   CO2 22 - 32 mmol/L 36  38  37   Calcium 8.9 - 10.3 mg/dL 8.8  8.1  7.9         Latest Ref Rng & Units 05/27/2022    6:50 AM 05/26/2022    4:13 AM 05/25/2022    4:37 AM  Hepatic Function  Total Protein 6.5 - 8.1 g/dL 7.0  6.2  6.1   Albumin 3.5 - 5.0 g/dL 2.6  2.2  2.1   AST 15 - 41 U/L 35  41  40   ALT 0 - 44 U/L 47  49  50   Alk Phosphatase 38 - 126 U/L 54  47  48   Total Bilirubin 0.3 - 1.2 mg/dL 0.5  0.3  0.3        Latest Ref Rng & Units 05/27/2022    6:50 AM 05/26/2022    4:13 AM 05/25/2022    4:37 AM  CBC  WBC 4.0 - 10.5 K/uL 9.1  7.7  6.8   Hemoglobin 13.0 - 17.0 g/dL 78.2  95.6  9.8   Hematocrit 39.0 - 52.0 % 35.7  31.7  29.8   Platelets 150 - 400 K/uL 481  402  353    Lab Results   Component Value Date   MCV 88.8 05/27/2022   MCV 92.4 05/26/2022   MCV 93.1 05/25/2022   Lab Results  Component Value Date   TSH 0.355 05/23/2022   Lab Results  Component Value Date   HGBA1C 5.7 (H) 03/05/2021     BNP    Component Value Date/Time   BNP 377.2 (H) 05/25/2022 0437    ProBNP No results found for: "PROBNP"   Lipid Panel     Component Value Date/Time   CHOL 116 03/05/2021 1114   TRIG 66 03/05/2021 1114   HDL 49 03/05/2021 1114   CHOLHDL 2.4 03/05/2021 1114   VLDL 13 03/05/2021 1114   LDLCALC 54 03/05/2021 1114     RADIOLOGY: No results found.   Additional studies/ records that were reviewed today include:   I reviewed the records from [facility] health ER evaluation  of July 21, 2022.   ASSESSMENT:    1. Sinus tachycardia   2. Hyperlipidemia, mixed   3. Sick sinus syndrome   4. OSA (obstructive sleep apnea): Inspire device   5. Acute on chronic diastolic CHF (congestive heart failure)   6. Cardiac pacemaker in situ   7. Right bundle branch block   8. First degree atrioventricular block     PLAN:  Mr. 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  He has a history of prior permanent pacemaker insertion at [facility] approximately 6 years ago which recently required explantation secondary to his strep bacteremia and ultimate insertion of a new Medtronic Micra AV 2 permanent pacemaker by Dr. Steffanie Dunn.  He recently has had some issues with blood pressure and was evaluated at [facility] ER.  Most recently he is now on amlodipine 5 mg and takes carvedilol long-acting 10 mg daily in addition to furosemide 60 mg and losartan 25 mg.  His pulse today is tachycardic at 108.  I have recommended he discontinue carvedilol CR and in its place I will institute carvedilol 6.25 mg twice a day for 2 weeks and if his resting heart rate is above 90 he will increase  this to 12.5 mg twice a day.  He is tachycardic on exam today and there is a suggestion of possible soft gallop.  I am recommending he undergo a 2D echo Doppler study for assessment of LV systolic and diastolic function as well as valvular architecture.  He has a history of prostate CA and has undergone bladder surgery.  He has obstructive sleep apnea and received the inspire device [facility] Dr[facility] approximately 1 to 2 years ago.  He believes he is sleeping better with his inspire device.  He has mild obesity with BMI of 33.8.  His ECG today shows sinus tachycardia with right bundle branch block and first-degree AV block with LVH.  I reviewed laboratory which she had done in January 2024.  At that time, glucose was elevated 144.  CO2 was increased at 36.  AST was normal at 35 and ALT was minimally increased at 47.  I have recommended that fasting laboratory be obtained including a comprehensive metabolic panel, CBC, TSH and fasting lipid studies.  I will see him in approximately 6 weeks for follow-up evaluation after his echo and laboratory.   Medication Adjustments/Labs and Tests Ordered: Current medicines are reviewed at length with the patient today.  Concerns regarding medicines are outlined above.  Medication changes, Labs and Tests ordered today are listed in the Patient Instructions below. Patient Instructions  Medication Instructions:  STOP Carvedilol CR 10 mg START Carvedilol 6.25 mg twice daily- monitor HR for 2 weeks- if above 100, increase to 12.5 mg twice daily.  *If you need a refill on your cardiac medications before your next appointment, please call your pharmacy*   Lab Work: CMET, CBC, TSH, LIPID (come back fasting, or have it drawn at your facility- and send [place] the results)   If you have labs (blood work) drawn today and your tests are completely normal, you will receive your results only by: MyChart Message (if you have MyChart) OR A paper copy in  the mail If you have any lab test that is abnormal or we need to change your treatment, we will call you to review the results.  Testing/Procedures:  Echocardiogram - Your physician has requested that you have an echocardiogram. Echocardiography is a painless test that uses sound waves to create images of your heart. It provides your doctor with information about the size and shape of your heart and how well your heart's chambers and valves are working. This procedure takes approximately one hour. There are no restrictions for this procedure.     Follow-Up: At [facility], you and your health needs are our priority.  As part of our continuing mission to provide you with exceptional heart care, we have created designated Provider Care Teams.  These Care Teams include your primary Cardiologist (physician) and Advanced Practice Providers (APPs -  Physician Assistants and Nurse Practitioners) who all work together to provide you with the care you need, when you need it.  We recommend signing up for the patient portal called "MyChart".  Sign up information is provided on this After Visit Summary.  MyChart is used to connect with patients for Virtual Visits (Telemedicine).  Patients are able to view lab/test results, encounter notes, upcoming appointments, etc.  Non-urgent messages can be sent to your provider as well.   To learn more about what you can do with MyChart, go to [email or web address].    Your next appointment:   6 week(s)  Provider:   Marjie Skiff, PA-C, Juanda Crumble, PA-C, Azalee Course, PA-C, Bernadene Person, NP, or Carlos Levering, NP         Signed, Nicki Guadalajara, MD  08/23/2022 2:38 PM    [facility] [address], [place], [place]  [address] Phone: [phone]

## 2022-08-18 ENCOUNTER — Encounter (HOSPITAL_COMMUNITY): Payer: Self-pay

## 2022-08-18 ENCOUNTER — Ambulatory Visit (HOSPITAL_COMMUNITY)
Admission: RE | Admit: 2022-08-18 | Discharge: 2022-08-18 | Disposition: A | Discharge status: Home / Self Care | Payer: Medicare Other | Source: Ambulatory Visit | Attending: Pulmonary Disease | Admitting: Pulmonary Disease

## 2022-08-18 ENCOUNTER — Ambulatory Visit: Payer: TRICARE For Life (TFL) | Admitting: Cardiovascular Disease

## 2022-08-18 DIAGNOSIS — J189 Pneumonia, unspecified organism: Secondary | ICD-10-CM

## 2022-08-21 ENCOUNTER — Ambulatory Visit: Payer: TRICARE For Life (TFL) | Admitting: Cardiovascular Disease

## 2022-08-23 ENCOUNTER — Encounter: Payer: Self-pay | Admitting: Cardiovascular Disease

## 2022-09-07 ENCOUNTER — Ambulatory Visit (HOSPITAL_COMMUNITY)
Admission: RE | Admit: 2022-09-07 | Discharge: 2022-09-07 | Disposition: A | Discharge status: Home / Self Care | Payer: Medicare Other | Source: Ambulatory Visit | Attending: Pulmonary Disease | Admitting: Pulmonary Disease

## 2022-09-07 DIAGNOSIS — J189 Pneumonia, unspecified organism: Secondary | ICD-10-CM | POA: Insufficient documentation

## 2022-09-13 NOTE — Progress Notes (Signed)
Your ct chest scan show resolution of your prior pneumonia.

## 2022-09-14 ENCOUNTER — Ambulatory Visit (HOSPITAL_COMMUNITY): Payer: Medicare Other | Attending: Cardiovascular Disease

## 2022-09-14 DIAGNOSIS — E782 Mixed hyperlipidemia: Secondary | ICD-10-CM

## 2022-09-14 DIAGNOSIS — R000 Tachycardia, unspecified: Secondary | ICD-10-CM

## 2022-09-14 DIAGNOSIS — I495 Sick sinus syndrome: Secondary | ICD-10-CM | POA: Diagnosis present

## 2022-09-14 LAB — ECHOCARDIOGRAM COMPLETE
Area-P 1/2: 5.71 cm2
S' Lateral: 3.4 cm

## 2022-09-15 ENCOUNTER — Other Ambulatory Visit (HOSPITAL_COMMUNITY): Payer: TRICARE For Life (TFL)

## 2022-09-25 NOTE — Progress Notes (Unsigned)
Cardiology Clinic Note   Date: 09/27/2022 ID: Douglas Perkins., DOB [birth year], MRN [id number]  Primary Cardiologist:  Nicki Guadalajara, MD  Patient Profile    Douglas Perkins. is a 77 y.o. male who presents to the clinic today for follow-up after medication changes.  Past medical history significant for: Chronic diastolic heart failure. Echo 09/14/2022: EF 60 to 65%.  Normal RV function.  Moderate LAE.  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 S/p PPM 2017. PPM extraction 04/17/2022 ([facility]). 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 In clinic device check 07/19/2022: Normal device function. Bacteremia. TEE 04/14/2022: EF 55 to 60%.  No valvular vegetations noted; isolated density right atrial lead consistent with vegetation.  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 Hypertension. Hyperlipidemia. OSA. Lymphoma. Amaurosis fugax. Prostate cancer. Right leg Merkel cell carcinoma.   History of Present Illness    Douglas Perkins. was first really dated by Dr. Lalla Brothers on 04/11/2022 during hospital admission for bacteremia and sepsis.  TEE showed vegetation on right atrial lead of PPM.  Patient underwent PPM extraction with placement of leadless pacemaker during hospital admission.  Patient was last seen by Dr. Lalla Brothers on 07/19/2022 for follow-up.  He was doing well and no changes were made.  He waited by Dr. Tresa Endo on 08/16/2022 to establish general cardiology care at the request of Dr. Francine Graven, pulmonology, and Dr. Lalla Brothers.  Patient was tachycardiac at visit.  His carvedilol CR was changed to carvedilol twice daily 6.25 mg.  Today, patient is doing well. He states the facility has been checking his blood pressure and heart rate twice a day but he is unsure what his readings are.  [facility] was contacted and they provided blood pressure readings, however they stated while his heart rate was checked it was  never documented.  Heart rate today is 80 bpm.  BP ranging from 120-170/80-90 based on readings provided by [facility] via telephone. Patient denies shortness of breath or dyspnea on exertion. No chest pain, pressure, or tightness. Denies lower extremity edema, orthopnea, or PND. No palpitations.  Patient is very active at his living facility.  He is the [employer] for residence.  He participates in seated yoga and other seated exercise classes.  He is also part of a walking club and has "walked to [facility] this year" based on converting steps to miles.  Patient have not labs drawn today at his facility.  Will request results.   ROS: All other systems reviewed and are otherwise negative except as noted in History of Present Illness.  Studies Reviewed    ECG is not ordered today.      Physical Exam    VS:  BP (!) 140/60 (BP Location: Left Arm, Patient Position: Sitting, Cuff Size: Normal)   Pulse 80   Ht 6\' 1"  (1.854 m)   Wt 249 lb 9.6 oz (113.2 kg)   SpO2 94%   BMI 32.93 kg/m  , BMI Body mass index is 32.93 kg/m.  GEN: Well nourished, well developed, in no acute distress. Neck: No JVD or carotid bruits. Cardiac:  RRR. No murmurs. No rubs or gallops.   Respiratory:  Respirations regular and unlabored. Clear to auscultation without rales, wheezing or rhonchi. GI: Soft, nontender, nondistended. Extremities: Radials/DP/PT 2+ and equal bilaterally. No clubbing or cyanosis. No edema.  Severe atrophy right lower extremity secondary to radiation treatments for Merkel cell carcinoma. Skin: Warm and dry, no rash. Neuro: Strength intact.  Assessment & Plan    Tachycardia.  At visit with Dr. Tresa Endo in April 2024 patient was noted to be tachycardic.  Patient reports his heart rate has always been in the high 90s to 100s.  His carvedilol was increased at that visit and his living facility was post to be documenting his heart rate twice a day, however upon contacting the  facility via telephone we were informed they have not been documenting his heart rate although they have been checking it.  He feels that his heart rate has been running in the 90s since increasing the medicine.  He denies palpitations or feeling like his heart is racing or skipping.  His heart rate is 80 today.  Although we are lacking information based on BP readings and heart rate today I think it is reasonable to further increase carvedilol to 12.5 mg twice daily.  Provided patient with written instructions for living facility to document heart rate readings and contact the office by 10/18/2022 with those readings. Chronic diastolic heart failure.  Echo May 2024 showed normal LV/RV function, moderate LAE.  Patient denies lower extremity edema, shortness of breath, DOE, orthopnea, PND.  He has the implantable inspire device for OSA.  He is very active performing seated exercise and participating in a walking club.  Euvolemic and well compensated on exam.  Continue carvedilol (see #1), Lasix, losartan. Syncope/SSS.  PPM 2017 performed at [facility] health care.  PPM extraction December 2023 secondary to bacteremia with subsequent leadless pacemaker implantation.  In clinic device check March 2024 showed normal device function.  Patient denies lightheadedness, dizziness, presyncope, syncope.  Continue with remote device checks and follow-up with EP. Hypertension. BP today 140/60.  BP at facility ranging from 120-170/80-90 (unsure when these readings were taken or if medication was on board when they were taken).  Patient denies headaches, dizziness or vision changes. Continue amlodipine, carvedilol (see #1), losartan.  Disposition: Increase carvedilol to 12.5 mg twice daily.  Requested facility contact the office on 10/18/2022 with heart rate readings.  Return in 3 months or sooner as needed.         Signed, Etta Grandchild. Ranier Coach, DNP, NP-C

## 2022-09-27 ENCOUNTER — Encounter: Payer: Self-pay | Admitting: Student

## 2022-09-27 ENCOUNTER — Ambulatory Visit: Payer: Medicare Other | Attending: Student | Admitting: Student

## 2022-09-27 VITALS — BP 140/60 | HR 80 | Ht 73.0 in | Wt 249.6 lb

## 2022-09-27 DIAGNOSIS — R55 Syncope and collapse: Secondary | ICD-10-CM | POA: Diagnosis present

## 2022-09-27 DIAGNOSIS — I495 Sick sinus syndrome: Secondary | ICD-10-CM

## 2022-09-27 DIAGNOSIS — I5032 Chronic diastolic (congestive) heart failure: Secondary | ICD-10-CM | POA: Diagnosis present

## 2022-09-27 DIAGNOSIS — I10 Essential (primary) hypertension: Secondary | ICD-10-CM

## 2022-09-27 DIAGNOSIS — R000 Tachycardia, unspecified: Secondary | ICD-10-CM | POA: Diagnosis present

## 2022-09-27 MED ORDER — CARVEDILOL 12.5 MG PO TABS: 12.5000 mg | ORAL_TABLET | Freq: Two times a day (BID) | ORAL | 3 refills | Status: DC

## 2022-09-27 MED ORDER — CARVEDILOL 12.5 MG PO TABS: 12.5000 mg | ORAL_TABLET | Freq: Two times a day (BID) | ORAL | 3 refills | Status: AC

## 2022-09-27 NOTE — Patient Instructions (Signed)
Medication Instructions:  Your physician has recommended you make the following change in your medication:  INCREASE: Carvedilol 12.5mg  twice daily.  *If you need a refill on your cardiac medications before your next appointment, please call your pharmacy*   Lab Work: NONE If you have labs (blood work) drawn today and your tests are completely normal, you will receive your results only by: MyChart Message (if you have MyChart) OR A paper copy in the mail If you have any lab test that is abnormal or we need to change your treatment, we will call you to review the results.   Testing/Procedures: NONE   Follow-Up: At [facility], you and your health needs are our priority.  As part of our continuing mission to provide you with exceptional heart care, we have created designated Provider Care Teams.  These Care Teams include your primary Cardiologist (physician) and Advanced Practice Providers (APPs -  Physician Assistants and Nurse Practitioners) who all work together to provide you with the care you need, when you need it.  We recommend signing up for the patient portal called "MyChart".  Sign up information is provided on this After Visit Summary.  MyChart is used to connect with patients for Virtual Visits (Telemedicine).  Patients are able to view lab/test results, encounter notes, upcoming appointments, etc.  Non-urgent messages can be sent to your provider as well.   To learn more about what you can do with MyChart, go to [email or web address].    Your next appointment:   3 month(s)  Provider:   Nicki Guadalajara, MD Or Carlos Levering, NP

## 2022-10-17 ENCOUNTER — Ambulatory Visit (INDEPENDENT_AMBULATORY_CARE_PROVIDER_SITE_OTHER): Payer: Medicare Other

## 2022-10-17 DIAGNOSIS — I495 Sick sinus syndrome: Secondary | ICD-10-CM | POA: Diagnosis not present

## 2022-10-25 LAB — CUP PACEART REMOTE DEVICE CHECK
Battery Remaining Longevity: 120 mo
Battery Voltage: 3.1 V
Brady Statistic AS VP Percent: 78.86 %
Brady Statistic AS VS Percent: 21.14 %
Brady Statistic RV Percent Paced: 0.31 %
Date Time Interrogation Session: 20240626105706
Implantable Pulse Generator Implant Date: 20231218
Lead Channel Impedance Value: 640 Ohm
Lead Channel Pacing Threshold Amplitude: 0.38 V
Lead Channel Pacing Threshold Pulse Width: 0.24 ms
Lead Channel Sensing Intrinsic Amplitude: >=5
Lead Channel Setting Pacing Amplitude: 0.88 V
Lead Channel Setting Pacing Pulse Width: 0.24 ms
Lead Channel Setting Sensing Sensitivity: 1.5 mV

## 2022-11-08 NOTE — Progress Notes (Signed)
Remote pacemaker transmission.   

## 2022-12-18 NOTE — Progress Notes (Deleted)
Cardiology Clinic Note   Date: 12/18/2022 ID: Douglas Perkins., DOB 1946/02/12, MRN [id number]  Primary Cardiologist:  Nicki Guadalajara, MD  Patient Profile    Douglas Perkins. is a 77 y.o. male who presents to the clinic today for ***    Past medical history significant for: Chronic diastolic heart failure. Echo 09/14/2022: EF 60 to 65%.  Normal RV function.  Moderate LAE.  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 S/p PPM 2017. PPM extraction 04/17/2022 ([facility]). 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 Remote device check 10/25/2022: Normal device function.  Lead parameters stable. Bacteremia. TEE 04/14/2022: EF 55 to 60%.  No valvular vegetations noted; isolated density right atrial lead consistent with vegetation.  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 Hypertension. Hyperlipidemia. OSA. Lymphoma. Amaurosis fugax. Prostate cancer. Right leg Merkel cell carcinoma.     History of Present Illness    Douglas Perkins. was first really dated by Dr. Lalla Brothers on 04/11/2022 during hospital admission for bacteremia and sepsis.  TEE showed vegetation on right atrial lead of PPM.  Patient underwent PPM extraction with placement of leadless pacemaker during hospital admission.   Patient was last seen by Dr. Lalla Brothers on 07/19/2022 for follow-up.  He was doing well and no changes were made.   He was seen by Dr. Tresa Endo on 08/16/2022 to establish general cardiology care at the request of Dr. Francine Graven, pulmonology, and Dr. Lalla Brothers.  Patient was tachycardiac at visit.  His carvedilol CR was changed to carvedilol twice daily 6.25 mg.  Patient was last seen in the office by me on 09/27/2022 for routine follow-up.  Patient resides at [facility].  He reported facility was checking his BP and heart rate twice a day but did not have a printout of the readings.  [facility] was contacted and provided BP readings  however stated heart rate was never documented.  Heart rate at the time of his visit was 80 bpm.  BP ranged 120-170/80-90.  Carvedilol was increased to 12.5 mg twice daily.  It was requested facility contact the office with 2 weeks of heart rate readings.  Today, patient ***    Tachycardia.  At visit with Dr. Tresa Endo in April 2024 patient was noted to be tachycardic.  Patient*** Chronic diastolic heart failure.  Echo May 2024 showed normal LV/RV function, moderate LAE.  Patient denies lower extremity edema, shortness of breath, DOE, orthopnea, PND.  He has the implantable inspire device for OSA.  He is very active performing seated exercise and participating in a walking club.  Euvolemic and well compensated on exam.  Continue carvedilol, Lasix, losartan.*** Syncope/SSS.  PPM 2017 performed at [facility] care.  PPM extraction December 2023 secondary to bacteremia with subsequent leadless pacemaker implantation.  Remote device check June 2024 showed normal device function with stable lead parameters.  Patient denies lightheadedness, dizziness, presyncope, syncope.  Continue with remote device checks and follow-up with EP.*** Hypertension. BP today***.  Patient denies headaches, dizziness or vision changes. Continue amlodipine, carvedilol, losartan.   ROS: All other systems reviewed and are otherwise negative except as noted in History of Present Illness.  Studies Reviewed       ***  Risk Assessment/Calculations    {Does this patient have ATRIAL FIBRILLATION?:[id number]} No BP recorded.  {Refresh Note OR Click here to enter BP  :1}***        Physical Exam    VS:  There were no vitals taken  for this visit. , BMI There is no height or weight on file to calculate BMI.  GEN: Well nourished, well developed, in no acute distress. Neck: No JVD or carotid bruits. Cardiac: *** RRR. No murmurs. No rubs or gallops.   Respiratory:  Respirations regular and unlabored. Clear to auscultation  without rales, wheezing or rhonchi. GI: Soft, nontender, nondistended. Extremities: Radials/DP/PT 2+ and equal bilaterally. No clubbing or cyanosis. No edema ***  Skin: Warm and dry, no rash. Neuro: Strength intact.  Assessment & Plan   ***  Disposition: ***     {Are you ordering a CV Procedure (e.g. stress test, cath, DCCV, TEE, etc)?   Press F2        :[id number]}   Signed, Etta Grandchild. Tyrea Froberg, DNP, NP-C

## 2022-12-21 ENCOUNTER — Ambulatory Visit: Payer: TRICARE For Life (TFL) | Admitting: Student

## 2022-12-29 ENCOUNTER — Ambulatory Visit: Payer: TRICARE For Life (TFL) | Admitting: Student

## 2023-01-16 ENCOUNTER — Ambulatory Visit (INDEPENDENT_AMBULATORY_CARE_PROVIDER_SITE_OTHER): Payer: Medicare Other

## 2023-01-16 DIAGNOSIS — I495 Sick sinus syndrome: Secondary | ICD-10-CM

## 2023-01-16 LAB — CUP PACEART REMOTE DEVICE CHECK
Battery Remaining Longevity: 120 mo
Battery Voltage: 3.09 V
Brady Statistic AS VP Percent: 83.63 %
Brady Statistic AS VS Percent: 16.37 %
Brady Statistic RV Percent Paced: 1.13 %
Date Time Interrogation Session: 20240917100506
Implantable Pulse Generator Implant Date: 20231218
Lead Channel Impedance Value: 600 Ohm
Lead Channel Pacing Threshold Amplitude: 0.38 V
Lead Channel Pacing Threshold Pulse Width: 0.24 ms
Lead Channel Sensing Intrinsic Amplitude: >=5
Lead Channel Setting Pacing Amplitude: 0.88 V
Lead Channel Setting Pacing Pulse Width: 0.24 ms
Lead Channel Setting Sensing Sensitivity: 1.5 mV

## 2023-01-31 NOTE — Progress Notes (Signed)
Remote pacemaker transmission.   

## 2023-09-30 DEATH — deceased

## 2024-01-15 ENCOUNTER — Ambulatory Visit: Payer: TRICARE For Life (TFL) | Attending: Family Medicine

## 2024-07-15 ENCOUNTER — Ambulatory Visit

## 2024-10-14 ENCOUNTER — Ambulatory Visit

## 2025-01-13 ENCOUNTER — Ambulatory Visit

## 2025-04-14 ENCOUNTER — Ambulatory Visit

## 2025-07-14 ENCOUNTER — Ambulatory Visit
